# Patient Record
Sex: Female | Born: 1945
Health system: Southern US, Community
[De-identification: ages and names within clinical notes are randomized; demographics above are authoritative.]

## PROBLEM LIST (undated history)

## (undated) DIAGNOSIS — Z800 Family history of malignant neoplasm of digestive organs: Secondary | ICD-10-CM

## (undated) DIAGNOSIS — M1990 Unspecified osteoarthritis, unspecified site: Secondary | ICD-10-CM

## (undated) DIAGNOSIS — E669 Obesity, unspecified: Secondary | ICD-10-CM

## (undated) DIAGNOSIS — Z803 Family history of malignant neoplasm of breast: Secondary | ICD-10-CM

## (undated) DIAGNOSIS — M8580 Other specified disorders of bone density and structure, unspecified site: Secondary | ICD-10-CM

## (undated) DIAGNOSIS — H8109 Meniere's disease, unspecified ear: Secondary | ICD-10-CM

## (undated) DIAGNOSIS — Z8489 Family history of other specified conditions: Secondary | ICD-10-CM

## (undated) DIAGNOSIS — I2510 Atherosclerotic heart disease of native coronary artery without angina pectoris: Secondary | ICD-10-CM

## (undated) DIAGNOSIS — E039 Hypothyroidism, unspecified: Secondary | ICD-10-CM

## (undated) DIAGNOSIS — I10 Essential (primary) hypertension: Secondary | ICD-10-CM

## (undated) DIAGNOSIS — I219 Acute myocardial infarction, unspecified: Secondary | ICD-10-CM

## (undated) HISTORY — DX: Meniere's disease, unspecified ear: H81.09

## (undated) HISTORY — DX: Hypothyroidism, unspecified: E03.9

## (undated) HISTORY — PX: TYMPANOPLASTY: SHX33

## (undated) HISTORY — DX: Unspecified osteoarthritis, unspecified site: M19.90

## (undated) HISTORY — DX: Family history of malignant neoplasm of breast: Z80.3

## (undated) HISTORY — PX: TUBAL LIGATION: SHX77

## (undated) HISTORY — PX: BREAST SURGERY: SHX581

## (undated) HISTORY — DX: Other specified disorders of bone density and structure, unspecified site: M85.80

## (undated) HISTORY — DX: Family history of malignant neoplasm of digestive organs: Z80.0

---

## 1998-11-25 ENCOUNTER — Other Ambulatory Visit: Admission: RE | Admit: 1998-11-25 | Discharge: 1998-11-25 | Payer: Self-pay | Admitting: Obstetrics and Gynecology

## 2001-01-20 ENCOUNTER — Encounter: Payer: Self-pay | Admitting: Internal Medicine

## 2001-01-20 ENCOUNTER — Ambulatory Visit (HOSPITAL_COMMUNITY): Admission: RE | Admit: 2001-01-20 | Discharge: 2001-01-20 | Payer: Self-pay | Admitting: Internal Medicine

## 2001-05-04 ENCOUNTER — Other Ambulatory Visit: Admission: RE | Admit: 2001-05-04 | Discharge: 2001-05-04 | Payer: Self-pay | Admitting: Gynecology

## 2002-03-05 ENCOUNTER — Encounter: Payer: Self-pay | Admitting: Internal Medicine

## 2002-03-05 ENCOUNTER — Ambulatory Visit (HOSPITAL_COMMUNITY): Admission: RE | Admit: 2002-03-05 | Discharge: 2002-03-05 | Payer: Self-pay | Admitting: Internal Medicine

## 2003-04-15 ENCOUNTER — Ambulatory Visit (HOSPITAL_COMMUNITY): Admission: RE | Admit: 2003-04-15 | Discharge: 2003-04-15 | Payer: Self-pay | Admitting: Internal Medicine

## 2003-04-15 ENCOUNTER — Encounter: Payer: Self-pay | Admitting: Internal Medicine

## 2003-05-02 ENCOUNTER — Other Ambulatory Visit: Admission: RE | Admit: 2003-05-02 | Discharge: 2003-05-02 | Payer: Self-pay | Admitting: Gynecology

## 2003-06-07 ENCOUNTER — Ambulatory Visit (HOSPITAL_COMMUNITY): Admission: RE | Admit: 2003-06-07 | Discharge: 2003-06-07 | Payer: Self-pay | Admitting: General Surgery

## 2004-05-07 ENCOUNTER — Other Ambulatory Visit: Admission: RE | Admit: 2004-05-07 | Discharge: 2004-05-07 | Payer: Self-pay | Admitting: Gynecology

## 2004-05-07 ENCOUNTER — Ambulatory Visit (HOSPITAL_COMMUNITY): Admission: RE | Admit: 2004-05-07 | Discharge: 2004-05-07 | Payer: Self-pay | Admitting: Internal Medicine

## 2004-11-03 ENCOUNTER — Other Ambulatory Visit: Admission: RE | Admit: 2004-11-03 | Discharge: 2004-11-03 | Payer: Self-pay | Admitting: Internal Medicine

## 2005-05-12 ENCOUNTER — Ambulatory Visit (HOSPITAL_COMMUNITY): Admission: RE | Admit: 2005-05-12 | Discharge: 2005-05-12 | Payer: Self-pay | Admitting: Gynecology

## 2005-05-17 ENCOUNTER — Other Ambulatory Visit: Admission: RE | Admit: 2005-05-17 | Discharge: 2005-05-17 | Payer: Self-pay | Admitting: Gynecology

## 2006-05-17 ENCOUNTER — Ambulatory Visit (HOSPITAL_COMMUNITY): Admission: RE | Admit: 2006-05-17 | Discharge: 2006-05-17 | Payer: Self-pay | Admitting: Gynecology

## 2006-05-20 ENCOUNTER — Other Ambulatory Visit: Admission: RE | Admit: 2006-05-20 | Discharge: 2006-05-20 | Payer: Self-pay | Admitting: Gynecology

## 2007-05-19 ENCOUNTER — Ambulatory Visit (HOSPITAL_COMMUNITY): Admission: RE | Admit: 2007-05-19 | Discharge: 2007-05-19 | Payer: Self-pay | Admitting: Obstetrics and Gynecology

## 2007-06-08 ENCOUNTER — Other Ambulatory Visit: Admission: RE | Admit: 2007-06-08 | Discharge: 2007-06-08 | Payer: Self-pay | Admitting: Gynecology

## 2008-05-22 ENCOUNTER — Ambulatory Visit (HOSPITAL_COMMUNITY): Admission: RE | Admit: 2008-05-22 | Discharge: 2008-05-22 | Payer: Self-pay | Admitting: Gynecology

## 2008-06-19 ENCOUNTER — Encounter: Payer: Self-pay | Admitting: Women's Health

## 2008-06-19 ENCOUNTER — Other Ambulatory Visit: Admission: RE | Admit: 2008-06-19 | Discharge: 2008-06-19 | Payer: Self-pay | Admitting: Gynecology

## 2008-06-19 ENCOUNTER — Ambulatory Visit: Payer: Self-pay | Admitting: Women's Health

## 2009-04-11 ENCOUNTER — Emergency Department (HOSPITAL_COMMUNITY): Admission: EM | Admit: 2009-04-11 | Discharge: 2009-04-11 | Payer: Self-pay | Admitting: Emergency Medicine

## 2009-05-23 ENCOUNTER — Ambulatory Visit (HOSPITAL_COMMUNITY): Admission: RE | Admit: 2009-05-23 | Discharge: 2009-05-23 | Payer: Self-pay | Admitting: Internal Medicine

## 2009-05-30 ENCOUNTER — Encounter: Admission: RE | Admit: 2009-05-30 | Discharge: 2009-05-30 | Payer: Self-pay | Admitting: Ophthalmology

## 2010-05-25 ENCOUNTER — Ambulatory Visit (HOSPITAL_COMMUNITY)
Admission: RE | Admit: 2010-05-25 | Discharge: 2010-05-25 | Payer: Self-pay | Source: Home / Self Care | Admitting: Internal Medicine

## 2010-11-06 NOTE — H&P (Signed)
NAME:  Gloria Mccoy, Gloria Mccoy                        ACCOUNT NO.:  [id number]   MEDICAL RECORD NO.:  [id number]                  PATIENT TYPE:   LOCATION:                                       FACILITY:  [facility]   PHYSICIAN:  Dalia Heading, M.D.               DATE OF BIRTH:  [birth year]   DATE OF ADMISSION:  06/07/2003  DATE OF DISCHARGE:                                HISTORY & PHYSICAL   CHIEF COMPLAINT:  Need for screening colonoscopy.   HISTORY OF PRESENT ILLNESS:  The patient is a 65 year old black female who  is referred for a screening colonoscopy.  She has never had a colonoscopy.  She denies any abdominal pain complaints.  She has no history of melena or  hematochezia.  There is no family history of colon carcinoma.   PAST MEDICAL HISTORY:  Includes arthritis.   PAST SURGICAL HISTORY:  Tubal ligation, ear surgery.   CURRENT MEDICATIONS:  None.   ALLERGIES:  No known drug allergies.   REVIEW OF SYMPTOMS:  Unremarkable.   PHYSICAL EXAMINATION:  GENERAL:  The patient is a well-developed, well-  nourished black female in no acute distress.  VITAL SIGNS:  She is afebrile and vital signs are stable.  LUNGS:  Clear to auscultation with equal breath sounds bilaterally.  HEART:  Reveals a regular rate and rhythm without S3, S4, or murmurs.  ABDOMEN:  Soft, nontender, nondistended.  No hepatosplenomegaly, masses, or  hernias are identified.  RECTAL:  Deferred to the procedure.   IMPRESSION:  Need for screening colonoscopy.   PLAN:  The patient is scheduled for a colonoscopy on June 07, 2003.  The  risks and benefits of the procedure including bleeding and perforation were  fully explained to the patient, who gave informed consent.     ___________________________________________                                         Dalia Heading, M.D.   MAJ/MEDQ  D:  05/30/2003  T:  05/30/2003  Job:  [id number]   cc:   Madelin Rear. Sherwood Gambler, M.D.  [address]  [place]  [place] [address]  Fax:  [phone]

## 2011-05-06 ENCOUNTER — Other Ambulatory Visit (HOSPITAL_COMMUNITY): Payer: Self-pay | Admitting: Internal Medicine

## 2011-05-06 DIAGNOSIS — Z139 Encounter for screening, unspecified: Secondary | ICD-10-CM

## 2011-05-31 ENCOUNTER — Ambulatory Visit (HOSPITAL_COMMUNITY)
Admission: RE | Admit: 2011-05-31 | Discharge: 2011-05-31 | Disposition: A | Discharge status: Home / Self Care | Payer: Medicare HMO | Source: Ambulatory Visit | Attending: Internal Medicine | Admitting: Internal Medicine

## 2011-05-31 DIAGNOSIS — Z139 Encounter for screening, unspecified: Secondary | ICD-10-CM

## 2011-05-31 DIAGNOSIS — Z1231 Encounter for screening mammogram for malignant neoplasm of breast: Secondary | ICD-10-CM | POA: Insufficient documentation

## 2011-12-31 ENCOUNTER — Encounter: Payer: Self-pay | Admitting: Gynecology

## 2011-12-31 DIAGNOSIS — M1990 Unspecified osteoarthritis, unspecified site: Secondary | ICD-10-CM | POA: Insufficient documentation

## 2011-12-31 DIAGNOSIS — M8580 Other specified disorders of bone density and structure, unspecified site: Secondary | ICD-10-CM | POA: Insufficient documentation

## 2011-12-31 DIAGNOSIS — H8109 Meniere's disease, unspecified ear: Secondary | ICD-10-CM | POA: Insufficient documentation

## 2012-01-06 ENCOUNTER — Encounter: Payer: Self-pay | Admitting: Gynecology

## 2012-01-06 ENCOUNTER — Other Ambulatory Visit (HOSPITAL_COMMUNITY)
Admission: RE | Admit: 2012-01-06 | Discharge: 2012-01-06 | Disposition: A | Discharge status: Home / Self Care | Payer: Medicare HMO | Source: Ambulatory Visit | Attending: Gynecology | Admitting: Gynecology

## 2012-01-06 ENCOUNTER — Ambulatory Visit (INDEPENDENT_AMBULATORY_CARE_PROVIDER_SITE_OTHER): Payer: Medicare HMO | Admitting: Gynecology

## 2012-01-06 VITALS — BP 114/66 | Ht 63.0 in | Wt 188.0 lb

## 2012-01-06 DIAGNOSIS — M949 Disorder of cartilage, unspecified: Secondary | ICD-10-CM

## 2012-01-06 DIAGNOSIS — M8580 Other specified disorders of bone density and structure, unspecified site: Secondary | ICD-10-CM

## 2012-01-06 DIAGNOSIS — R3915 Urgency of urination: Secondary | ICD-10-CM

## 2012-01-06 DIAGNOSIS — N952 Postmenopausal atrophic vaginitis: Secondary | ICD-10-CM

## 2012-01-06 DIAGNOSIS — N8111 Cystocele, midline: Secondary | ICD-10-CM

## 2012-01-06 DIAGNOSIS — Z124 Encounter for screening for malignant neoplasm of cervix: Secondary | ICD-10-CM

## 2012-01-06 DIAGNOSIS — E039 Hypothyroidism, unspecified: Secondary | ICD-10-CM | POA: Insufficient documentation

## 2012-01-06 DIAGNOSIS — Z1151 Encounter for screening for human papillomavirus (HPV): Secondary | ICD-10-CM | POA: Insufficient documentation

## 2012-01-06 NOTE — Progress Notes (Signed)
Gloria Mccoy [birth year] [id number]        66 y.o.  [id number] for follow up exam.  Has not been here in over 3 years. Several issues noted below  Past medical history,surgical history, medications, allergies, family history and social history were all reviewed and documented in the EPIC chart. ROS:  Was performed and pertinent positives and negatives are included in the history.  Exam: Kim assistant Filed Vitals:   01/06/12 1054  BP: 114/66  Height: 5\' 3"  (1.6 m)  Weight: 188 lb (85.276 kg)   General appearance  Normal Skin grossly normal Head/Neck normal with no cervical or supraclavicular adenopathy thyroid normal Lungs  clear Cardiac RR, without RMG Abdominal  soft, nontender, without masses, organomegaly or hernia Breasts  examined lying and sitting without masses, retractions, discharge or axillary adenopathy. Pelvic  Ext/BUS/vagina  Atrophic changes with mild cystocele right labia minora larger than left but normal in appearance  Cervix  normal atrophic Pap/HPV  Uterus  axial, normal size, shape and contour, midline and mobile nontender   Adnexa  Without masses or tenderness    Anus and perineum  normal   Rectovaginal  normal sphincter tone without palpated masses or tenderness.    Assessment/Plan:  67 y.o. [id number] female for follow up exam.   1. Osteopenia.  DEXA 06/2007 T score -1.5 FRAX 11%/0.9%. Repeat DEXA ordered today. 2. Vitamin D deficiency. History of low vitamin D before of 11,008. On follow up supplementation 2009 was 35. Recheck vitamin D level today. Increase calcium vitamin D discussed. 3. Mild cystocele/vaginal atrophy. Patient asymptomatic other than mild urgency symptoms. Behavior modification issues reviewed.  Check baseline urinalysis. 4. Pap smear. Last Pap smear 2009. Pap/HPV today. Discussed current screening guidelines. She has no history of abnormal Paps before. If this Paps normal them plan no further screening. 5. Mammography. Patient due November  2013. I reminded her to schedule this. SBE monthly reviewed. 6. Colonoscopy. Patient with and her 10 year interval. Reports having stool guaiacs done through her primary physician's office. 7. Health maintenance. Patient will continue to see her primary for routine health care and follow up of her hypothyroidism. Patient will see me in follow up for her DEXA then otherwise in one year.    Dara Lords MD, 11:17 AM 01/06/2012

## 2012-01-06 NOTE — Patient Instructions (Signed)
Follow up for bone density study as scheduled Follow up in one year for annual exam

## 2012-01-07 ENCOUNTER — Telehealth: Payer: Self-pay | Admitting: Gynecology

## 2012-01-07 LAB — VITAMIN D 25 HYDROXY (VIT D DEFICIENCY, FRACTURES): Vit D, 25-Hydroxy: 29 ng/mL — ABNORMAL LOW (ref 30–89)

## 2012-01-07 NOTE — Telephone Encounter (Signed)
Patient returned my call regarding her lab results. I had in error closed the result note so am documenting here.  Patient was informed that her Vitamin D level was marginally low and that Dr. Velvet Bathe recommended she take 2000 units Vitamin D daily. She was told she can get that OTC.

## 2012-01-08 LAB — URINALYSIS W MICROSCOPIC + REFLEX CULTURE
Bacteria, UA: NONE SEEN
Casts: NONE SEEN
Glucose, UA: NEGATIVE mg/dL
Protein, ur: NEGATIVE mg/dL
Specific Gravity, Urine: 1.018 (ref 1.005–1.030)
Squamous Epithelial / LPF: NONE SEEN
pH: 5.5 (ref 5.0–8.0)

## 2012-01-20 DIAGNOSIS — M8580 Other specified disorders of bone density and structure, unspecified site: Secondary | ICD-10-CM

## 2012-01-20 HISTORY — DX: Other specified disorders of bone density and structure, unspecified site: M85.80

## 2012-02-01 ENCOUNTER — Ambulatory Visit (INDEPENDENT_AMBULATORY_CARE_PROVIDER_SITE_OTHER): Payer: Medicare HMO

## 2012-02-01 DIAGNOSIS — M899 Disorder of bone, unspecified: Secondary | ICD-10-CM

## 2012-02-01 DIAGNOSIS — M8580 Other specified disorders of bone density and structure, unspecified site: Secondary | ICD-10-CM

## 2012-02-03 ENCOUNTER — Encounter: Payer: Self-pay | Admitting: Gynecology

## 2012-04-13 ENCOUNTER — Other Ambulatory Visit (HOSPITAL_COMMUNITY): Payer: Self-pay | Admitting: Internal Medicine

## 2012-04-13 DIAGNOSIS — Z139 Encounter for screening, unspecified: Secondary | ICD-10-CM

## 2012-06-01 ENCOUNTER — Ambulatory Visit (HOSPITAL_COMMUNITY): Payer: Medicare HMO

## 2012-06-08 ENCOUNTER — Ambulatory Visit (HOSPITAL_COMMUNITY): Payer: Medicare HMO

## 2012-06-12 ENCOUNTER — Ambulatory Visit (HOSPITAL_COMMUNITY)
Admission: RE | Admit: 2012-06-12 | Discharge: 2012-06-12 | Disposition: A | Discharge status: Home / Self Care | Payer: Medicare HMO | Source: Ambulatory Visit | Attending: Internal Medicine | Admitting: Internal Medicine

## 2012-06-12 DIAGNOSIS — Z139 Encounter for screening, unspecified: Secondary | ICD-10-CM

## 2012-06-12 DIAGNOSIS — Z1231 Encounter for screening mammogram for malignant neoplasm of breast: Secondary | ICD-10-CM | POA: Insufficient documentation

## 2012-09-21 NOTE — H&P (Signed)
  NTS SOAP Note  Vital Signs:  Vitals as of: 09/21/2012: Systolic 157: Diastolic 79: Heart Rate 70: Temp 53F: Height 1ft 2in: Weight 191Lbs 0 Ounces: BMI 34.93  BMI : 34.93 kg/m2  Subjective: This 67 Years 98 Months old Female presents for screening TCS.  Denies any gi complaints.  Last had a TCS 10 years ago.  No family h/o colon cancer.  Review of Symptoms:  Constitutional:unremarkable   Head:unremarkable    Eyes:unremarkable   Nose/Mouth/Throat:unremarkable Cardiovascular:  unremarkable   Respiratory:unremarkable   Gastrointestinal:  unremarkable   Genitourinary:unremarkable     Musculoskeletal:unremarkable   Skin:unremarkable Hematolgic/Lymphatic:unremarkable     Allergic/Immunologic:unremarkable     Past Medical History:    Reviewed   Past Medical History  Surgical History: BTL Medical Problems:  Hypothyroidism Allergies: sulfa Medications: thyroid med   Social History:Reviewed  Social History  Preferred Language: English Race:  Black or African American Ethnicity: Not Hispanic / Latino Age: 67 Years 7 Months Marital Status:  M Alcohol: 1 glass daily Recreational drug(s):  No   Smoking Status: Never smoker reviewed on 09/21/2012 Functional Status reviewed on mm/dd/yyyy ------------------------------------------------ Bathing: Normal Cooking: Normal Dressing: Normal Driving: Normal Eating: Normal Managing Meds: Normal Oral Care: Normal Shopping: Normal Toileting: Normal Transferring: Normal Walking: Normal Cognitive Status reviewed on mm/dd/yyyy ------------------------------------------------ Attention: Normal Decision Making: Normal Language: Normal Memory: Normal Motor: Normal Perception: Normal Problem Solving: Normal Visual and Spatial: Normal   Family History:  Reviewed   Family History  Is there a family history of:CAD, DM    Objective Information: General:  Well appearing, well  nourished in no distress. Neck:  Supple without lymphadenopathy.  Heart:  RRR, no murmur Lungs:    CTA bilaterally, no wheezes, rhonchi, rales.  Breathing unlabored. Abdomen:Soft, NT/ND, no HSM, no masses.   deferred to procedure  Assessment:Need for Screening TCS  Diagnosis &amp; Procedure Smart Code   Plan:Scheduled for screening TCS on 09/26/12.   Patient Education:Alternative treatments to surgery were discussed with patient (and family).  Risks and benefits  of procedure were fully explained to the patient (and family) who gave informed consent. Patient/family questions were addressed.  Follow-up:Pending Surgery

## 2012-09-25 ENCOUNTER — Encounter (HOSPITAL_COMMUNITY): Payer: Self-pay | Admitting: Pharmacy Technician

## 2012-09-26 ENCOUNTER — Encounter (HOSPITAL_COMMUNITY): Payer: Self-pay | Admitting: *Deleted

## 2012-09-26 ENCOUNTER — Ambulatory Visit (HOSPITAL_COMMUNITY)
Admission: RE | Admit: 2012-09-26 | Discharge: 2012-09-26 | Disposition: A | Discharge status: Home / Self Care | Payer: Medicare HMO | Source: Ambulatory Visit | Attending: General Surgery | Admitting: General Surgery

## 2012-09-26 ENCOUNTER — Encounter (HOSPITAL_COMMUNITY)
Admission: RE | Disposition: A | Discharge status: Home / Self Care | Payer: Self-pay | Source: Ambulatory Visit | Attending: General Surgery

## 2012-09-26 DIAGNOSIS — Z1211 Encounter for screening for malignant neoplasm of colon: Secondary | ICD-10-CM | POA: Insufficient documentation

## 2012-09-26 HISTORY — PX: COLONOSCOPY: SHX5424

## 2012-09-26 SURGERY — COLONOSCOPY
Anesthesia: Moderate Sedation

## 2012-09-26 MED ORDER — MEPERIDINE HCL 50 MG/ML IJ SOLN
INTRAMUSCULAR | Status: AC
  Filled 2012-09-26: qty 1

## 2012-09-26 MED ORDER — SODIUM CHLORIDE 0.9 % IV SOLN
INTRAVENOUS | Status: DC
  Administered 2012-09-26: 10:00:00 via INTRAVENOUS

## 2012-09-26 MED ORDER — MEPERIDINE HCL 25 MG/ML IJ SOLN
INTRAMUSCULAR | Status: DC | PRN
  Administered 2012-09-26: 50 mg via INTRAVENOUS

## 2012-09-26 MED ORDER — STERILE WATER FOR IRRIGATION IR SOLN
Status: DC | PRN
  Administered 2012-09-26: 11:00:00

## 2012-09-26 MED ORDER — MIDAZOLAM HCL 5 MG/5ML IJ SOLN
INTRAMUSCULAR | Status: DC | PRN
  Administered 2012-09-26: 3 mg via INTRAVENOUS

## 2012-09-26 MED ORDER — MIDAZOLAM HCL 5 MG/5ML IJ SOLN
INTRAMUSCULAR | Status: AC
  Filled 2012-09-26: qty 5

## 2012-09-26 NOTE — Interval H&P Note (Signed)
History and Physical Interval Note:  09/26/2012 10:56 AM  Gloria Mccoy  has presented today for surgery, with the diagnosis of screening  The various methods of treatment have been discussed with the patient and family. After consideration of risks, benefits and other options for treatment, the patient has consented to  Procedure(s): COLONOSCOPY (N/A) as a surgical intervention .  The patient's history has been reviewed, patient examined, no change in status, stable for surgery.  I have reviewed the patient's chart and labs.  Questions were answered to the patient's satisfaction.     Franky Macho A

## 2012-09-26 NOTE — Op Note (Signed)
[facility] [address] [place], [address]   COLONOSCOPY PROCEDURE REPORT  PATIENT: Gloria Mccoy, Gloria Mccoy  MR#: [id number] BIRTHDATE: 07/24/1945 , 66  yrs. old GENDER: Female ENDOSCOPIST: Franky Macho, MD REFERRED XB:JYNWG, Peyton Najjar PROCEDURE DATE:  09/26/2012 PROCEDURE:   Colonoscopy, screening ASA CLASS:   Class II INDICATIONS:Average risk patient for colon cancer. MEDICATIONS: Versed 3 mg IV and Demerol 50 mg IV  DESCRIPTION OF PROCEDURE:   After the risks benefits and alternatives of the procedure were thoroughly explained, informed consent was obtained.  A digital rectal exam revealed no abnormalities of the rectum.   The [id number] ([id number])  endoscope was introduced through the anus and advanced to the cecum, which was identified by both the appendix and ileocecal valve. No adverse events experienced.   The quality of the prep was adequate, using MoviPrep  The instrument was then slowly withdrawn as the colon was fully examined.      COLON FINDINGS: A normal appearing cecum, ileocecal valve, and appendiceal orifice were identified.  The ascending, hepatic flexure, transverse, splenic flexure, descending, sigmoid colon and rectum appeared unremarkable.  No polyps or cancers were seen. Retroflexed views revealed no abnormalities. The time to cecum=4 minutes 0 seconds.  Withdrawal time=4 minutes 0 seconds.  The scope was withdrawn and the procedure completed. COMPLICATIONS: There were no complications.  ENDOSCOPIC IMPRESSION: Normal colon  RECOMMENDATIONS: Repeat Colonscopy in 10 years.   eSigned:  Franky Macho, MD 09/26/2012 11:12 AM   cc:

## 2012-09-28 ENCOUNTER — Encounter (HOSPITAL_COMMUNITY): Payer: Self-pay | Admitting: General Surgery

## 2013-05-11 ENCOUNTER — Other Ambulatory Visit: Payer: Self-pay | Admitting: Women's Health

## 2013-05-11 DIAGNOSIS — Z139 Encounter for screening, unspecified: Secondary | ICD-10-CM

## 2013-06-13 ENCOUNTER — Ambulatory Visit (INDEPENDENT_AMBULATORY_CARE_PROVIDER_SITE_OTHER): Payer: Medicare HMO | Admitting: Women's Health

## 2013-06-13 ENCOUNTER — Encounter: Payer: Self-pay | Admitting: Women's Health

## 2013-06-13 VITALS — BP 110/68 | Ht 64.0 in | Wt 188.0 lb

## 2013-06-13 DIAGNOSIS — M899 Disorder of bone, unspecified: Secondary | ICD-10-CM

## 2013-06-13 DIAGNOSIS — M8580 Other specified disorders of bone density and structure, unspecified site: Secondary | ICD-10-CM

## 2013-06-13 NOTE — Patient Instructions (Signed)
Health Recommendations for Postmenopausal Women Respected and ongoing research has looked at the most common causes of death, disability, and poor quality of life in postmenopausal women. The causes include heart disease, diseases of blood vessels, diabetes, depression, cancer, and bone loss (osteoporosis). Many things can be done to help lower the chances of developing these and other common problems: CARDIOVASCULAR DISEASE Heart Disease: A heart attack is a medical emergency. Know the signs and symptoms of a heart attack. Below are things women can do to reduce their risk for heart disease.   Do not smoke. If you smoke, quit.  Aim for a healthy weight. Being overweight causes many preventable deaths. Eat a healthy and balanced diet and drink an adequate amount of liquids.  Get moving. Make a commitment to be more physically active. Aim for 30 minutes of activity on most, if not all days of the week.  Eat for heart health. Choose a diet that is low in saturated fat and cholesterol and eliminate trans fat. Include whole grains, vegetables, and fruits. Read and understand the labels on food containers before buying.  Know your numbers. Ask your caregiver to check your blood pressure, cholesterol (total, HDL, LDL, triglycerides) and blood glucose. Work with your caregiver on improving your entire clinical picture.  High blood pressure. Limit or stop your table salt intake (try salt substitute and food seasonings). Avoid salty foods and drinks. Read labels on food containers before buying. Eating well and exercising can help control high blood pressure. STROKE  Stroke is a medical emergency. Stroke may be the result of a blood clot in a blood vessel in the brain or by a brain hemorrhage (bleeding). Know the signs and symptoms of a stroke. To lower the risk of developing a stroke:  Avoid fatty foods.  Quit smoking.  Control your diabetes, blood pressure, and irregular heart rate. THROMBOPHLEBITIS  (BLOOD CLOT) OF THE LEG  Becoming overweight and leading a stationary lifestyle may also contribute to developing blood clots. Controlling your diet and exercising will help lower the risk of developing blood clots. CANCER SCREENING  Breast Cancer: Take steps to reduce your risk of breast cancer.  You should practice "breast self-awareness." This means understanding the normal appearance and feel of your breasts and should include breast self-examination. Any changes detected, no matter how small, should be reported to your caregiver.  After age 40, you should have a clinical breast exam (CBE) every year.  Starting at age 40, you should consider having a mammogram (breast X-ray) every year.  If you have a family history of breast cancer, talk to your caregiver about genetic screening.  If you are at high risk for breast cancer, talk to your caregiver about having an MRI and a mammogram every year.  Intestinal or Stomach Cancer: Tests to consider are a rectal exam, fecal occult blood, sigmoidoscopy, and colonoscopy. Women who are high risk may need to be screened at an earlier age and more often.  Cervical Cancer:  Beginning at age 30, you should have a Pap test every 3 years as long as the past 3 Pap tests have been normal.  If you have had past treatment for cervical cancer or a condition that could lead to cancer, you need Pap tests and screening for cancer for at least 20 years after your treatment.  If you had a hysterectomy for a problem that was not cancer or a condition that could lead to cancer, then you no longer need Pap tests.    If you are between ages 65 and 70, and you have had normal Pap tests going back 10 years, you no longer need Pap tests.  If Pap tests have been discontinued, risk factors (such as a new sexual partner) need to be reassessed to determine if screening should be resumed.  Some medical problems can increase the chance of getting cervical cancer. In these  cases, your caregiver may recommend more frequent screening and Pap tests.  Uterine Cancer: If you have vaginal bleeding after reaching menopause, you should notify your caregiver.  Ovarian cancer: Other than yearly pelvic exams, there are no reliable tests available to screen for ovarian cancer at this time except for yearly pelvic exams.  Lung Cancer: Yearly chest X-rays can detect lung cancer and should be done on high risk women, such as cigarette smokers and women with chronic lung disease (emphysema).  Skin Cancer: A complete body skin exam should be done at your yearly examination. Avoid overexposure to the sun and ultraviolet light lamps. Use a strong sun block cream when in the sun. All of these things are important in lowering the risk of skin cancer. MENOPAUSE Menopause Symptoms: Hormone therapy products are effective for treating symptoms associated with menopause:  Moderate to severe hot flashes.  Night sweats.  Mood swings.  Headaches.  Tiredness.  Loss of sex drive.  Insomnia.  Other symptoms. Hormone replacement carries certain risks, especially in older women. Women who use or are thinking about using estrogen or estrogen with progestin treatments should discuss that with their caregiver. Your caregiver will help you understand the benefits and risks. The ideal dose of hormone replacement therapy is not known. The Food and Drug Administration (FDA) has concluded that hormone therapy should be used only at the lowest doses and for the shortest amount of time to reach treatment goals.  OSTEOPOROSIS Protecting Against Bone Loss and Preventing Fracture: If you use hormone therapy for prevention of bone loss (osteoporosis), the risks for bone loss must outweigh the risk of the therapy. Ask your caregiver about other medications known to be safe and effective for preventing bone loss and fractures. To guard against bone loss or fractures, the following is recommended:  If  you are less than age 50, take 1000 mg of calcium and at least 600 mg of Vitamin D per day.  If you are greater than age 50 but less than age 70, take 1200 mg of calcium and at least 600 mg of Vitamin D per day.  If you are greater than age 70, take 1200 mg of calcium and at least 800 mg of Vitamin D per day. Smoking and excessive alcohol intake increases the risk of osteoporosis. Eat foods rich in calcium and vitamin D and do weight bearing exercises several times a week as your caregiver suggests. DIABETES Diabetes Melitus: If you have Type I or Type 2 diabetes, you should keep your blood sugar under control with diet, exercise and recommended medication. Avoid too many sweets, starchy and fatty foods. Being overweight can make control more difficult. COGNITION AND MEMORY Cognition and Memory: Menopausal hormone therapy is not recommended for the prevention of cognitive disorders such as Alzheimer's disease or memory loss.  DEPRESSION  Depression may occur at any age, but is common in elderly women. The reasons may be because of physical, medical, social (loneliness), or financial problems and needs. If you are experiencing depression because of medical problems and control of symptoms, talk to your caregiver about this. Physical activity and   exercise may help with mood and sleep. Community and volunteer involvement may help your sense of value and worth. If you have depression and you feel that the problem is getting worse or becoming severe, talk to your caregiver about treatment options that are best for you. ACCIDENTS  Accidents are common and can be serious in the elderly woman. Prepare your house to prevent accidents. Eliminate throw rugs, place hand bars in the bath, shower and toilet areas. Avoid wearing high heeled shoes or walking on wet, snowy, and icy areas. Limit or stop driving if you have vision or hearing problems, or you feel you are unsteady with you movements and  reflexes. HEPATITIS C Hepatitis C is a type of viral infection affecting the liver. It is spread mainly through contact with blood from an infected person. It can be treated, but if left untreated, it can lead to severe liver damage over years. Many people who are infected do not know that the virus is in their blood. If you are a "baby-boomer", it is recommended that you have one screening test for Hepatitis C. IMMUNIZATIONS  Several immunizations are important to consider having during your senior years, including:   Tetanus, diptheria, and pertussis booster shot.  Influenza every year before the flu season begins.  Pneumonia vaccine.  Shingles vaccine.  Others as indicated based on your specific needs. Talk to your caregiver about these. Document Released: 07/30/2005 Document Revised: 05/24/2012 Document Reviewed: 03/25/2008 [employer] Patient Information 2014 [employer].  

## 2013-06-13 NOTE — Progress Notes (Signed)
Gloria MCEUEN 10-22-1945 [id number]    History:    The patient presents for breast and pelvic exam. History of normal Paps and mammograms. 01/2012 DEXA T score -1.3 of left hip, FRAX 7.9%/0.6%. Hypothyroid primary care manages. Normal colonoscopy 09/2012. Had zostavac, unsure of  Pneumovax.   Past medical history, past surgical history, family history and social history were all reviewed and documented in the EPIC chart. Menieres and arthritis. Numerous family members with type 2 diabetes and hypertension care.  Ceasar Mons Vitals:   06/13/13 1050  BP: 110/68    General appearance:  Normal Head/Neck:  Normal, without cervical or supraclavicular adenopathy. Thyroid:  Symmetrical, normal in size, without palpable masses or nodularity. Respiratory  Effort:  Normal  Auscultation:  Clear without wheezing or rhonchi Cardiovascular  Auscultation:  Regular rate, without rubs, murmurs or gallops  Edema/varicosities:  Not grossly evident Abdominal  Soft,nontender, without masses, guarding or rebound.  Liver/spleen:  No organomegaly noted  Hernia:  None appreciated  Skin  Inspection:  Grossly normal  Palpation:  Grossly normal Neurologic/psychiatric  Orientation:  Normal with appropriate conversation.  Mood/affect:  Normal  Genitourinary    Breasts: Examined lying and sitting.     Right: Without masses, retractions, discharge or axillary adenopathy.     Left: Without masses, retractions, discharge or axillary adenopathy.   Inguinal/mons:  Normal without inguinal adenopathy  External genitalia:  Normal  BUS/Urethra/Skene's glands:  Normal  Bladder:  Normal  Vagina:  Normal  Cervix:  Normal  Uterus:   normal in size, shape and contour.  Midline and mobile  Adnexa/parametria:     Rt: Without masses or tenderness.   Lt: Without masses or tenderness.  Anus and perineum: Normal  Digital rectal exam: Normal sphincter tone without palpated masses or tenderness  Assessment/Plan:  67 y.o.  MBF G2P2  for breast and pelvic exam with no complaints.  Osteopenia Hypothyroid-primary care manages labs and meds  Plan: SBE's, continue annual mammogram, calcium rich diet, vitamin D 2000 daily encouraged. Instructed to get  Pneumovax at primary care. Continue annual flu vaccine. Home safety and fall prevention discussed. Reviewed importance of increasing and maintaining regular exercise. Pap normal 2013, new screening guidelines reviewed.     Harrington Challenger WHNP, 11:14 AM 06/13/2013

## 2013-06-18 ENCOUNTER — Ambulatory Visit (HOSPITAL_COMMUNITY)
Admission: RE | Admit: 2013-06-18 | Discharge: 2013-06-18 | Disposition: A | Discharge status: Home / Self Care | Payer: Medicare HMO | Source: Ambulatory Visit | Attending: Women's Health | Admitting: Women's Health

## 2013-06-18 DIAGNOSIS — Z139 Encounter for screening, unspecified: Secondary | ICD-10-CM

## 2013-06-18 DIAGNOSIS — Z1231 Encounter for screening mammogram for malignant neoplasm of breast: Secondary | ICD-10-CM | POA: Insufficient documentation

## 2013-06-25 ENCOUNTER — Encounter: Payer: Self-pay | Admitting: Women's Health

## 2014-04-03 ENCOUNTER — Other Ambulatory Visit (HOSPITAL_COMMUNITY): Payer: Self-pay | Admitting: Internal Medicine

## 2014-04-03 DIAGNOSIS — Z1231 Encounter for screening mammogram for malignant neoplasm of breast: Secondary | ICD-10-CM

## 2014-04-22 ENCOUNTER — Encounter: Payer: Self-pay | Admitting: Women's Health

## 2014-06-10 ENCOUNTER — Ambulatory Visit (HOSPITAL_COMMUNITY): Payer: Medicare HMO

## 2014-06-19 ENCOUNTER — Other Ambulatory Visit (HOSPITAL_COMMUNITY): Payer: Self-pay | Admitting: Internal Medicine

## 2014-06-19 ENCOUNTER — Ambulatory Visit (HOSPITAL_COMMUNITY)
Admission: RE | Admit: 2014-06-19 | Discharge: 2014-06-19 | Disposition: A | Discharge status: Home / Self Care | Payer: Medicare HMO | Source: Ambulatory Visit | Attending: Internal Medicine | Admitting: Internal Medicine

## 2014-06-19 ENCOUNTER — Encounter: Payer: Medicare HMO | Admitting: Women's Health

## 2014-06-19 DIAGNOSIS — Z1231 Encounter for screening mammogram for malignant neoplasm of breast: Secondary | ICD-10-CM | POA: Diagnosis not present

## 2014-06-19 DIAGNOSIS — Z6833 Body mass index (BMI) 33.0-33.9, adult: Secondary | ICD-10-CM

## 2014-06-19 DIAGNOSIS — Z0000 Encounter for general adult medical examination without abnormal findings: Secondary | ICD-10-CM

## 2014-06-24 ENCOUNTER — Ambulatory Visit (HOSPITAL_COMMUNITY): Payer: Medicare HMO

## 2014-06-25 ENCOUNTER — Ambulatory Visit (HOSPITAL_COMMUNITY)
Admission: RE | Admit: 2014-06-25 | Discharge: 2014-06-25 | Disposition: A | Discharge status: Home / Self Care | Payer: Commercial Managed Care - HMO | Source: Ambulatory Visit | Attending: Internal Medicine | Admitting: Internal Medicine

## 2014-06-25 DIAGNOSIS — Z780 Asymptomatic menopausal state: Secondary | ICD-10-CM | POA: Insufficient documentation

## 2014-06-25 DIAGNOSIS — Z13820 Encounter for screening for osteoporosis: Secondary | ICD-10-CM | POA: Diagnosis not present

## 2014-06-25 DIAGNOSIS — M8580 Other specified disorders of bone density and structure, unspecified site: Secondary | ICD-10-CM | POA: Insufficient documentation

## 2014-06-25 DIAGNOSIS — Z6833 Body mass index (BMI) 33.0-33.9, adult: Secondary | ICD-10-CM

## 2014-06-25 DIAGNOSIS — Z0000 Encounter for general adult medical examination without abnormal findings: Secondary | ICD-10-CM

## 2014-06-25 DIAGNOSIS — E559 Vitamin D deficiency, unspecified: Secondary | ICD-10-CM | POA: Insufficient documentation

## 2014-07-03 ENCOUNTER — Encounter: Payer: Self-pay | Admitting: Women's Health

## 2014-07-03 ENCOUNTER — Ambulatory Visit (INDEPENDENT_AMBULATORY_CARE_PROVIDER_SITE_OTHER): Payer: Commercial Managed Care - HMO | Admitting: Women's Health

## 2014-07-03 VITALS — BP 130/80 | Ht 64.0 in | Wt 194.0 lb

## 2014-07-03 DIAGNOSIS — Z01419 Encounter for gynecological examination (general) (routine) without abnormal findings: Secondary | ICD-10-CM

## 2014-07-03 DIAGNOSIS — M8580 Other specified disorders of bone density and structure, unspecified site: Secondary | ICD-10-CM

## 2014-07-03 NOTE — Patient Instructions (Signed)
Health Recommendations for Postmenopausal Women Respected and ongoing research has looked at the most common causes of death, disability, and poor quality of life in postmenopausal women. The causes include heart disease, diseases of blood vessels, diabetes, depression, cancer, and bone loss (osteoporosis). Many things can be done to help lower the chances of developing these and other common problems. CARDIOVASCULAR DISEASE Heart Disease: A heart attack is a medical emergency. Know the signs and symptoms of a heart attack. Below are things women can do to reduce their risk for heart disease.   Do not smoke. If you smoke, quit.  Aim for a healthy weight. Being overweight causes many preventable deaths. Eat a healthy and balanced diet and drink an adequate amount of liquids.  Get moving. Make a commitment to be more physically active. Aim for 30 minutes of activity on most, if not all days of the week.  Eat for heart health. Choose a diet that is low in saturated fat and cholesterol and eliminate trans fat. Include whole grains, vegetables, and fruits. Read and understand the labels on food containers before buying.  Know your numbers. Ask your caregiver to check your blood pressure, cholesterol (total, HDL, LDL, triglycerides) and blood glucose. Work with your caregiver on improving your entire clinical picture.  High blood pressure. Limit or stop your table salt intake (try salt substitute and food seasonings). Avoid salty foods and drinks. Read labels on food containers before buying. Eating well and exercising can help control high blood pressure. STROKE  Stroke is a medical emergency. Stroke may be the result of a blood clot in a blood vessel in the brain or by a brain hemorrhage (bleeding). Know the signs and symptoms of a stroke. To lower the risk of developing a stroke:  Avoid fatty foods.  Quit smoking.  Control your diabetes, blood pressure, and irregular heart rate. THROMBOPHLEBITIS  (BLOOD CLOT) OF THE LEG  Becoming overweight and leading a stationary lifestyle may also contribute to developing blood clots. Controlling your diet and exercising will help lower the risk of developing blood clots. CANCER SCREENING  Breast Cancer: Take steps to reduce your risk of breast cancer.  You should practice "breast self-awareness." This means understanding the normal appearance and feel of your breasts and should include breast self-examination. Any changes detected, no matter how small, should be reported to your caregiver.  After age 40, you should have a clinical breast exam (CBE) every year.  Starting at age 40, you should consider having a mammogram (breast X-ray) every year.  If you have a family history of breast cancer, talk to your caregiver about genetic screening.  If you are at high risk for breast cancer, talk to your caregiver about having an MRI and a mammogram every year.  Intestinal or Stomach Cancer: Tests to consider are a rectal exam, fecal occult blood, sigmoidoscopy, and colonoscopy. Women who are high risk may need to be screened at an earlier age and more often.  Cervical Cancer:  Beginning at age 30, you should have a Pap test every 3 years as long as the past 3 Pap tests have been normal.  If you have had past treatment for cervical cancer or a condition that could lead to cancer, you need Pap tests and screening for cancer for at least 20 years after your treatment.  If you had a hysterectomy for a problem that was not cancer or a condition that could lead to cancer, then you no longer need Pap tests.    If you are between ages 65 and 70, and you have had normal Pap tests going back 10 years, you no longer need Pap tests.  If Pap tests have been discontinued, risk factors (such as a new sexual partner) need to be reassessed to determine if screening should be resumed.  Some medical problems can increase the chance of getting cervical cancer. In these  cases, your caregiver may recommend more frequent screening and Pap tests.  Uterine Cancer: If you have vaginal bleeding after reaching menopause, you should notify your caregiver.  Ovarian Cancer: Other than yearly pelvic exams, there are no reliable tests available to screen for ovarian cancer at this time except for yearly pelvic exams.  Lung Cancer: Yearly chest X-rays can detect lung cancer and should be done on high risk women, such as cigarette smokers and women with chronic lung disease (emphysema).  Skin Cancer: A complete body skin exam should be done at your yearly examination. Avoid overexposure to the sun and ultraviolet light lamps. Use a strong sun block cream when in the sun. All of these things are important for lowering the risk of skin cancer. MENOPAUSE Menopause Symptoms: Hormone therapy products are effective for treating symptoms associated with menopause:  Moderate to severe hot flashes.  Night sweats.  Mood swings.  Headaches.  Tiredness.  Loss of sex drive.  Insomnia.  Other symptoms. Hormone replacement carries certain risks, especially in older women. Women who use or are thinking about using estrogen or estrogen with progestin treatments should discuss that with their caregiver. Your caregiver will help you understand the benefits and risks. The ideal dose of hormone replacement therapy is not known. The Food and Drug Administration (FDA) has concluded that hormone therapy should be used only at the lowest doses and for the shortest amount of time to reach treatment goals.  OSTEOPOROSIS Protecting Against Bone Loss and Preventing Fracture If you use hormone therapy for prevention of bone loss (osteoporosis), the risks for bone loss must outweigh the risk of the therapy. Ask your caregiver about other medications known to be safe and effective for preventing bone loss and fractures. To guard against bone loss or fractures, the following is recommended:  If  you are younger than age 50, take 1000 mg of calcium and at least 600 mg of Vitamin D per day.  If you are older than age 50 but younger than age 70, take 1200 mg of calcium and at least 600 mg of Vitamin D per day.  If you are older than age 70, take 1200 mg of calcium and at least 800 mg of Vitamin D per day. Smoking and excessive alcohol intake increases the risk of osteoporosis. Eat foods rich in calcium and vitamin D and do weight bearing exercises several times a week as your caregiver suggests. DIABETES Diabetes Mellitus: If you have type I or type 2 diabetes, you should keep your blood sugar under control with diet, exercise, and recommended medication. Avoid starchy and fatty foods, and too many sweets. Being overweight can make diabetes control more difficult. COGNITION AND MEMORY Cognition and Memory: Menopausal hormone therapy is not recommended for the prevention of cognitive disorders such as Alzheimer's disease or memory loss.  DEPRESSION  Depression may occur at any age, but it is common in elderly women. This may be because of physical, medical, social (loneliness), or financial problems and needs. If you are experiencing depression because of medical problems and control of symptoms, talk to your caregiver about this. Physical   activity and exercise may help with mood and sleep. Community and volunteer involvement may improve your sense of value and worth. If you have depression and you feel that the problem is getting worse or becoming severe, talk to your caregiver about which treatment options are best for you. ACCIDENTS  Accidents are common and can be serious in elderly woman. Prepare your house to prevent accidents. Eliminate throw rugs, place hand bars in bath, shower, and toilet areas. Avoid wearing high heeled shoes or walking on wet, snowy, and icy areas. Limit or stop driving if you have vision or hearing problems, or if you feel you are unsteady with your movements and  reflexes. HEPATITIS C Hepatitis C is a type of viral infection affecting the liver. It is spread mainly through contact with blood from an infected person. It can be treated, but if left untreated, it can lead to severe liver damage over the years. Many people who are infected do not know that the virus is in their blood. If you are a "baby-boomer", it is recommended that you have one screening test for Hepatitis C. IMMUNIZATIONS  Several immunizations are important to consider having during your senior years, including:   Tetanus, diphtheria, and pertussis booster shot.  Influenza every year before the flu season begins.  Pneumonia vaccine.  Shingles vaccine.  Others, as indicated based on your specific needs. Talk to your caregiver about these. Document Released: 07/30/2005 Document Revised: 10/22/2013 Document Reviewed: 03/25/2008 Exit[employer] Patient Information 2015 [employer]. This information is not intended to replace advice given to you by your health care provider. Make sure you discuss any questions you have with your health care provider.  

## 2014-07-03 NOTE — Progress Notes (Signed)
Gloria Mccoy 11-Apr-1946 [id number]    History:    Presents for annual exam.  Postmenopausal/no HRT/no bleeding. Normal Pap and mammogram history. 06/2014 T score -1.1 femoral neck FRAX 6.1%/0.6%. Hypothyroid primary care manages. Has had Zostavax and Pneumovax. History of Mnire's and arthritis.  Past medical history, past surgical history, family history and social history were all reviewed and documented in the EPIC chart. Retired from a Psychologist, educational job. 2 children both doing well, no grandchildren.  ROS:  A ROS was performed and pertinent positives and negatives are included.  Exam:  Filed Vitals:   07/03/14 0841  BP: 130/80    General appearance:  Normal Thyroid:  Symmetrical, normal in size, without palpable masses or nodularity. Respiratory  Auscultation:  Clear without wheezing or rhonchi Cardiovascular  Auscultation:  Regular rate, without rubs, murmurs or gallops  Edema/varicosities:  Not grossly evident Abdominal  Soft,nontender, without masses, guarding or rebound.  Liver/spleen:  No organomegaly noted  Hernia:  None appreciated  Skin  Inspection:  Grossly normal   Breasts: Examined lying and sitting.     Right: Without masses, retractions, discharge or axillary adenopathy.     Left: Without masses, retractions, discharge or axillary adenopathy. Gentitourinary   Inguinal/mons:  Normal without inguinal adenopathy  External genitalia:  Normal  BUS/Urethra/Skene's glands:  Normal  Vagina:  Normal  Cervix:  Normal  Uterus:   normal in size, shape and contour.  Midline and mobile  Adnexa/parametria:     Rt: Without masses or tenderness.   Lt: Without masses or tenderness.  Anus and perineum: Normal  Digital rectal exam: Normal sphincter tone without palpated masses or tenderness  Assessment/Plan:  69 y.o. MBF G2 P2 for annual GYN exam no complaints.  Osteopenia without elevated FRAX Postmenopausal/no HRT/no bleeding Hypothyroid-primary care manages labs  and meds  Plan: SBE's, continue annual screening mammogram, calcium rich diet, vitamin D 2000 daily encouraged. Reviewed importance of increasing regular exercise and decreasing calories for weight loss. Home safety, fall prevention reviewed. UA, Pap normal with negative HR HPV 2013, new screening guidelines reviewed.  Huel Cote [facility], 10:51 AM 07/03/2014

## 2014-07-04 LAB — URINALYSIS W MICROSCOPIC + REFLEX CULTURE
BACTERIA UA: NONE SEEN
BILIRUBIN URINE: NEGATIVE
CASTS: NONE SEEN
CRYSTALS: NONE SEEN
GLUCOSE, UA: NEGATIVE mg/dL
Hgb urine dipstick: NEGATIVE
Ketones, ur: NEGATIVE mg/dL
NITRITE: NEGATIVE
Protein, ur: NEGATIVE mg/dL
SPECIFIC GRAVITY, URINE: 1.013 (ref 1.005–1.030)
UROBILINOGEN UA: 0.2 mg/dL (ref 0.0–1.0)
pH: 6.5 (ref 5.0–8.0)

## 2014-07-05 LAB — URINE CULTURE
COLONY COUNT: NO GROWTH
Organism ID, Bacteria: NO GROWTH

## 2014-11-19 DIAGNOSIS — J301 Allergic rhinitis due to pollen: Secondary | ICD-10-CM | POA: Diagnosis not present

## 2014-11-19 DIAGNOSIS — Z6832 Body mass index (BMI) 32.0-32.9, adult: Secondary | ICD-10-CM | POA: Diagnosis not present

## 2014-11-19 DIAGNOSIS — J209 Acute bronchitis, unspecified: Secondary | ICD-10-CM | POA: Diagnosis not present

## 2014-11-19 DIAGNOSIS — E6609 Other obesity due to excess calories: Secondary | ICD-10-CM | POA: Diagnosis not present

## 2014-11-22 DIAGNOSIS — E6609 Other obesity due to excess calories: Secondary | ICD-10-CM | POA: Diagnosis not present

## 2014-11-22 DIAGNOSIS — J209 Acute bronchitis, unspecified: Secondary | ICD-10-CM | POA: Diagnosis not present

## 2014-11-22 DIAGNOSIS — Z6833 Body mass index (BMI) 33.0-33.9, adult: Secondary | ICD-10-CM | POA: Diagnosis not present

## 2014-12-20 DIAGNOSIS — Z6832 Body mass index (BMI) 32.0-32.9, adult: Secondary | ICD-10-CM | POA: Diagnosis not present

## 2014-12-20 DIAGNOSIS — Z1389 Encounter for screening for other disorder: Secondary | ICD-10-CM | POA: Diagnosis not present

## 2014-12-20 DIAGNOSIS — E6609 Other obesity due to excess calories: Secondary | ICD-10-CM | POA: Diagnosis not present

## 2014-12-20 DIAGNOSIS — H3530 Unspecified macular degeneration: Secondary | ICD-10-CM | POA: Diagnosis not present

## 2014-12-26 DIAGNOSIS — H25013 Cortical age-related cataract, bilateral: Secondary | ICD-10-CM | POA: Diagnosis not present

## 2014-12-26 DIAGNOSIS — H2513 Age-related nuclear cataract, bilateral: Secondary | ICD-10-CM | POA: Diagnosis not present

## 2015-01-07 DIAGNOSIS — H25011 Cortical age-related cataract, right eye: Secondary | ICD-10-CM | POA: Diagnosis not present

## 2015-01-07 DIAGNOSIS — H2511 Age-related nuclear cataract, right eye: Secondary | ICD-10-CM | POA: Diagnosis not present

## 2015-01-20 NOTE — Patient Instructions (Signed)
Your procedure is scheduled on: 01/28/2015  Report to [facility] at  900 AM    Call this number if you have problems the morning of surgery: [phone]   Do not eat food or drink liquids :After Midnight.      Take these medicines the morning of surgery with A SIP OF WATER: synthroid   Do not wear jewelry, make-up or nail polish.  Do not wear lotions, powders, or perfumes.   Do not shave 48 hours prior to surgery.  Do not bring valuables to the hospital.  Contacts, dentures or bridgework may not be worn into surgery.  Leave suitcase in the car. After surgery it may be brought to your room.  For patients admitted to the hospital, checkout time is 11:00 AM the day of discharge.   Patients discharged the day of surgery will not be allowed to drive home.  :     Please read over the following fact sheets that you were given: Coughing and Deep Breathing, Surgical Site Infection Prevention, Anesthesia Post-op Instructions and Care and Recovery After Surgery    Cataract A cataract is a clouding of the lens of the eye. When a lens becomes cloudy, vision is reduced based on the degree and nature of the clouding. Many cataracts reduce vision to some degree. Some cataracts make people more near-sighted as they develop. Other cataracts increase glare. Cataracts that are ignored and become worse can sometimes look white. The white color can be seen through the pupil. CAUSES   Aging. However, cataracts may occur at any age, even in newborns.   Certain drugs.   Trauma to the eye.   Certain diseases such as diabetes.   Specific eye diseases such as chronic inflammation inside the eye or a sudden attack of a rare form of glaucoma.   Inherited or acquired medical problems.  SYMPTOMS   Gradual, progressive drop in vision in the affected eye.   Severe, rapid visual loss. This most often happens when trauma is the cause.  DIAGNOSIS  To detect a cataract, an eye doctor examines the lens. Cataracts  are best diagnosed with an exam of the eyes with the pupils enlarged (dilated) by drops.  TREATMENT  For an early cataract, vision may improve by using different eyeglasses or stronger lighting. If that does not help your vision, surgery is the only effective treatment. A cataract needs to be surgically removed when vision loss interferes with your everyday activities, such as driving, reading, or watching TV. A cataract may also have to be removed if it prevents examination or treatment of another eye problem. Surgery removes the cloudy lens and usually replaces it with a substitute lens (intraocular lens, IOL).  At a time when both you and your doctor agree, the cataract will be surgically removed. If you have cataracts in both eyes, only one is usually removed at a time. This allows the operated eye to heal and be out of danger from any possible problems after surgery (such as infection or poor wound healing). In rare cases, a cataract may be doing damage to your eye. In these cases, your caregiver may advise surgical removal right away. The vast majority of people who have cataract surgery have better vision afterward. HOME CARE INSTRUCTIONS  If you are not planning surgery, you may be asked to do the following:  Use different eyeglasses.   Use stronger or brighter lighting.   Ask your eye doctor about reducing your medicine dose or changing medicines  if it is thought that a medicine caused your cataract. Changing medicines does not make the cataract go away on its own.   Become familiar with your surroundings. Poor vision can lead to injury. Avoid bumping into things on the affected side. You are at a higher risk for tripping or falling.   Exercise extreme care when driving or operating machinery.   Wear sunglasses if you are sensitive to bright light or experiencing problems with glare.  SEEK IMMEDIATE MEDICAL CARE IF:   You have a worsening or sudden vision loss.   You notice redness,  swelling, or increasing pain in the eye.   You have a fever.  Document Released: 06/07/2005 Document Revised: 05/27/2011 Document Reviewed: 01/29/2011 [facility] Patient Information 2012 [place].PATIENT INSTRUCTIONS POST-ANESTHESIA  IMMEDIATELY FOLLOWING SURGERY:  Do not drive or operate machinery for the first twenty four hours after surgery.  Do not make any important decisions for twenty four hours after surgery or while taking narcotic pain medications or sedatives.  If you develop intractable nausea and vomiting or a severe headache please notify your doctor immediately.  FOLLOW-UP:  Please make an appointment with your surgeon as instructed. You do not need to follow up with anesthesia unless specifically instructed to do so.  WOUND CARE INSTRUCTIONS (if applicable):  Keep a dry clean dressing on the anesthesia/puncture wound site if there is drainage.  Once the wound has quit draining you may leave it open to air.  Generally you should leave the bandage intact for twenty four hours unless there is drainage.  If the epidural site drains for more than 36-48 hours please call the anesthesia department.  QUESTIONS?:  Please feel free to call your physician or the hospital operator if you have any questions, and they will be happy to assist you.

## 2015-01-22 ENCOUNTER — Encounter (HOSPITAL_COMMUNITY): Payer: Self-pay

## 2015-01-22 ENCOUNTER — Encounter (HOSPITAL_COMMUNITY)
Admission: RE | Admit: 2015-01-22 | Discharge: 2015-01-22 | Disposition: A | Discharge status: Home / Self Care | Payer: Commercial Managed Care - HMO | Source: Ambulatory Visit | Attending: Ophthalmology | Admitting: Ophthalmology

## 2015-01-22 DIAGNOSIS — H2511 Age-related nuclear cataract, right eye: Secondary | ICD-10-CM | POA: Insufficient documentation

## 2015-01-22 DIAGNOSIS — Z01818 Encounter for other preprocedural examination: Secondary | ICD-10-CM | POA: Insufficient documentation

## 2015-01-22 LAB — CBC WITH DIFFERENTIAL/PLATELET
BASOS ABS: 0 10*3/uL (ref 0.0–0.1)
Basophils Relative: 0 % (ref 0–1)
Eosinophils Absolute: 0.2 10*3/uL (ref 0.0–0.7)
Eosinophils Relative: 2 % (ref 0–5)
HCT: 37.2 % (ref 36.0–46.0)
HEMOGLOBIN: 13 g/dL (ref 12.0–15.0)
Lymphocytes Relative: 39 % (ref 12–46)
Lymphs Abs: 4.1 10*3/uL — ABNORMAL HIGH (ref 0.7–4.0)
MCH: 30.2 pg (ref 26.0–34.0)
MCHC: 34.9 g/dL (ref 30.0–36.0)
MCV: 86.5 fL (ref 78.0–100.0)
Monocytes Absolute: 1 10*3/uL (ref 0.1–1.0)
Monocytes Relative: 9 % (ref 3–12)
Neutro Abs: 5.2 10*3/uL (ref 1.7–7.7)
Neutrophils Relative %: 50 % (ref 43–77)
PLATELETS: 248 10*3/uL (ref 150–400)
RBC: 4.3 MIL/uL (ref 3.87–5.11)
RDW: 13.7 % (ref 11.5–15.5)
WBC: 10.5 10*3/uL (ref 4.0–10.5)

## 2015-01-22 LAB — BASIC METABOLIC PANEL
Anion gap: 9 (ref 5–15)
BUN: 15 mg/dL (ref 6–20)
CHLORIDE: 101 mmol/L (ref 101–111)
CO2: 29 mmol/L (ref 22–32)
Calcium: 8.9 mg/dL (ref 8.9–10.3)
Creatinine, Ser: 1.22 mg/dL — ABNORMAL HIGH (ref 0.44–1.00)
GFR calc Af Amer: 51 mL/min — ABNORMAL LOW (ref >60)
GFR calc non Af Amer: 44 mL/min — ABNORMAL LOW (ref >60)
GLUCOSE: 115 mg/dL — AB (ref 65–99)
POTASSIUM: 3.5 mmol/L (ref 3.5–5.1)
SODIUM: 139 mmol/L (ref 135–145)

## 2015-01-22 NOTE — Pre-Procedure Instructions (Signed)
Patient given information to sign up for my chart at home. 

## 2015-01-23 ENCOUNTER — Encounter (HOSPITAL_COMMUNITY)
Admission: RE | Admit: 2015-01-23 | Discharge: 2015-01-23 | Disposition: A | Discharge status: Home / Self Care | Payer: Commercial Managed Care - HMO | Source: Ambulatory Visit | Attending: Ophthalmology | Admitting: Ophthalmology

## 2015-01-27 MED ORDER — TETRACAINE HCL 0.5 % OP SOLN
OPHTHALMIC | Status: AC
  Filled 2015-01-27: qty 2

## 2015-01-27 MED ORDER — KETOROLAC TROMETHAMINE 0.5 % OP SOLN
OPHTHALMIC | Status: AC
  Filled 2015-01-27: qty 5

## 2015-01-27 MED ORDER — PHENYLEPHRINE HCL 2.5 % OP SOLN
OPHTHALMIC | Status: AC
  Filled 2015-01-27: qty 15

## 2015-01-27 MED ORDER — CYCLOPENTOLATE-PHENYLEPHRINE OP SOLN OPTIME - NO CHARGE
OPHTHALMIC | Status: AC
  Filled 2015-01-27: qty 2

## 2015-01-28 ENCOUNTER — Encounter (HOSPITAL_COMMUNITY)
Admission: RE | Disposition: A | Discharge status: Home / Self Care | Payer: Self-pay | Source: Ambulatory Visit | Attending: Ophthalmology

## 2015-01-28 ENCOUNTER — Encounter (HOSPITAL_COMMUNITY): Payer: Self-pay | Admitting: *Deleted

## 2015-01-28 ENCOUNTER — Ambulatory Visit (HOSPITAL_COMMUNITY): Payer: Commercial Managed Care - HMO | Admitting: Anesthesiology

## 2015-01-28 ENCOUNTER — Ambulatory Visit (HOSPITAL_COMMUNITY)
Admission: RE | Admit: 2015-01-28 | Discharge: 2015-01-28 | Disposition: A | Discharge status: Home / Self Care | Payer: Commercial Managed Care - HMO | Source: Ambulatory Visit | Attending: Ophthalmology | Admitting: Ophthalmology

## 2015-01-28 DIAGNOSIS — H2511 Age-related nuclear cataract, right eye: Secondary | ICD-10-CM | POA: Insufficient documentation

## 2015-01-28 DIAGNOSIS — E039 Hypothyroidism, unspecified: Secondary | ICD-10-CM | POA: Insufficient documentation

## 2015-01-28 DIAGNOSIS — M1990 Unspecified osteoarthritis, unspecified site: Secondary | ICD-10-CM | POA: Insufficient documentation

## 2015-01-28 DIAGNOSIS — H269 Unspecified cataract: Secondary | ICD-10-CM | POA: Diagnosis not present

## 2015-01-28 DIAGNOSIS — H8109 Meniere's disease, unspecified ear: Secondary | ICD-10-CM | POA: Insufficient documentation

## 2015-01-28 DIAGNOSIS — H25012 Cortical age-related cataract, left eye: Secondary | ICD-10-CM | POA: Diagnosis not present

## 2015-01-28 DIAGNOSIS — H25011 Cortical age-related cataract, right eye: Secondary | ICD-10-CM | POA: Diagnosis not present

## 2015-01-28 DIAGNOSIS — Z882 Allergy status to sulfonamides status: Secondary | ICD-10-CM | POA: Insufficient documentation

## 2015-01-28 DIAGNOSIS — Z7982 Long term (current) use of aspirin: Secondary | ICD-10-CM | POA: Insufficient documentation

## 2015-01-28 DIAGNOSIS — H2512 Age-related nuclear cataract, left eye: Secondary | ICD-10-CM | POA: Diagnosis not present

## 2015-01-28 HISTORY — PX: CATARACT EXTRACTION W/PHACO: SHX586

## 2015-01-28 SURGERY — PHACOEMULSIFICATION, CATARACT, WITH IOL INSERTION
Anesthesia: Monitor Anesthesia Care | Site: Eye | Laterality: Right

## 2015-01-28 MED ORDER — CYCLOPENTOLATE-PHENYLEPHRINE 0.2-1 % OP SOLN
1.0000 [drp] | OPHTHALMIC | Status: AC
  Administered 2015-01-28 (×3): 1 [drp] via OPHTHALMIC

## 2015-01-28 MED ORDER — MIDAZOLAM HCL 2 MG/2ML IJ SOLN
INTRAMUSCULAR | Status: AC
  Filled 2015-01-28: qty 2

## 2015-01-28 MED ORDER — TETRACAINE 0.5 % OP SOLN OPTIME - NO CHARGE
OPHTHALMIC | Status: DC | PRN
  Administered 2015-01-28: 1 [drp] via OPHTHALMIC

## 2015-01-28 MED ORDER — FENTANYL CITRATE (PF) 100 MCG/2ML IJ SOLN
25.0000 ug | INTRAMUSCULAR | Status: AC
  Administered 2015-01-28 (×2): 25 ug via INTRAVENOUS

## 2015-01-28 MED ORDER — PHENYLEPHRINE HCL 2.5 % OP SOLN
1.0000 [drp] | OPHTHALMIC | Status: AC
  Administered 2015-01-28 (×3): 1 [drp] via OPHTHALMIC

## 2015-01-28 MED ORDER — MIDAZOLAM HCL 2 MG/2ML IJ SOLN
1.0000 mg | INTRAMUSCULAR | Status: DC | PRN
Start: 2015-01-28 — End: 2015-01-30
  Administered 2015-01-28: 2 mg via INTRAVENOUS

## 2015-01-28 MED ORDER — TETRACAINE HCL 0.5 % OP SOLN
1.0000 [drp] | OPHTHALMIC | Status: AC
  Administered 2015-01-28 (×3): 1 [drp] via OPHTHALMIC

## 2015-01-28 MED ORDER — BSS IO SOLN
INTRAOCULAR | Status: DC | PRN
  Administered 2015-01-28: 15 mL via INTRAOCULAR

## 2015-01-28 MED ORDER — BSS IO SOLN
INTRAOCULAR | Status: DC | PRN
  Administered 2015-01-28: 500 mL

## 2015-01-28 MED ORDER — PROVISC 10 MG/ML IO SOLN
INTRAOCULAR | Status: DC | PRN
  Administered 2015-01-28: 0.85 mL via INTRAOCULAR

## 2015-01-28 MED ORDER — KETOROLAC TROMETHAMINE 0.5 % OP SOLN
1.0000 [drp] | OPHTHALMIC | Status: AC
  Administered 2015-01-28 (×3): 1 [drp] via OPHTHALMIC

## 2015-01-28 MED ORDER — LACTATED RINGERS IV SOLN
INTRAVENOUS | Status: DC
  Administered 2015-01-28: 1000 mL via INTRAVENOUS

## 2015-01-28 MED ORDER — EPINEPHRINE HCL 1 MG/ML IJ SOLN
INTRAMUSCULAR | Status: AC
  Filled 2015-01-28: qty 1

## 2015-01-28 MED ORDER — FENTANYL CITRATE (PF) 100 MCG/2ML IJ SOLN
INTRAMUSCULAR | Status: AC
  Filled 2015-01-28: qty 2

## 2015-01-28 SURGICAL SUPPLY — 25 items
CAPSULAR TENSION RING-AMO (OPHTHALMIC RELATED) IMPLANT
CLOTH BEACON ORANGE TIMEOUT ST (SAFETY) ×3 IMPLANT
EYE SHIELD UNIVERSAL CLEAR (GAUZE/BANDAGES/DRESSINGS) ×3 IMPLANT
GLOVE BIO SURGEON STRL SZ 6.5 (GLOVE) ×2 IMPLANT
GLOVE BIO SURGEONS STRL SZ 6.5 (GLOVE) ×1
GLOVE ECLIPSE 6.5 STRL STRAW (GLOVE) IMPLANT
GLOVE ECLIPSE 7.0 STRL STRAW (GLOVE) IMPLANT
GLOVE EXAM NITRILE LRG STRL (GLOVE) IMPLANT
GLOVE EXAM NITRILE MD LF STRL (GLOVE) ×3 IMPLANT
GLOVE SKINSENSE NS SZ6.5 (GLOVE)
GLOVE SKINSENSE STRL SZ6.5 (GLOVE) IMPLANT
HEALON 5 0.6 ML (INTRAOCULAR LENS) IMPLANT
KIT VITRECTOMY (OPHTHALMIC RELATED) IMPLANT
LENS ALC ACRYL/TECN (Ophthalmic Related) ×3 IMPLANT
PAD ARMBOARD 7.5X6 YLW CONV (MISCELLANEOUS) ×3 IMPLANT
PROC W NO LENS (INTRAOCULAR LENS)
PROC W SPEC LENS (INTRAOCULAR LENS)
PROCESS W NO LENS (INTRAOCULAR LENS) IMPLANT
PROCESS W SPEC LENS (INTRAOCULAR LENS) IMPLANT
RETRACTOR IRIS SIGHTPATH (OPHTHALMIC RELATED) IMPLANT
RING MALYGIN (MISCELLANEOUS) IMPLANT
TAPE SURG TRANSPORE 1 IN (GAUZE/BANDAGES/DRESSINGS) ×1 IMPLANT
TAPE SURGICAL TRANSPORE 1 IN (GAUZE/BANDAGES/DRESSINGS) ×2
VISCOELASTIC ADDITIONAL (OPHTHALMIC RELATED) IMPLANT
WATER STERILE IRR 250ML POUR (IV SOLUTION) ×3 IMPLANT

## 2015-01-28 NOTE — Op Note (Signed)
Patient brought to the operating room and prepped and draped in the usual manner.  Lid speculum inserted in right eye.  Stab incision made at the twelve o'clock position.  Provisc instilled in the anterior chamber.   A 2.4 mm. Stab incision was made temporally.  An anterior capsulotomy was done with a bent 25 gauge needle.  The nucleus was hydrodissected.  The Phaco tip was inserted in the anterior chamber and the nucleus was emulsified.  CDE was 5.70.  The cortical material was then removed with the I and A tip.  Posterior capsule was the polished.  The anterior chamber was deepened with Provisc.  A 22.0 Diopter Alcon SN60WF IOL was then inserted in the capsular bag.  Provisc was then removed with the I and A tip.  The wound was then hydrated.  Patient sent to the Recovery Room in good condition with follow up in my office.  Preoperative Diagnosis:  Nuclear Cataract OD Postoperative Diagnosis:  Same Procedure name: Kelman Phacoemulsification OD with IOL

## 2015-01-28 NOTE — Discharge Instructions (Signed)
[name]  01/28/2015           [facility] Instructions [place] [address]      1. Avoid closing eyes tightly. One often closes the eye tightly when laughing, talking, sneezing, coughing or if they feel irritated. At these times, you should be careful not to close your eyes tightly.  2. Instill eye drops as instructed. To instill drops in your eye, open it, look up and have someone gently pull the lower lid down and instill a couple of drops inside the lower lid.  3. Do not touch upper lid.  4. Take Advil or Tylenol for pain.  5. You may use either eye for near work, such as reading or sewing and you may watch television.  6. You may have your hair done at the beauty parlor at any time.  7. Wear dark glasses with or without your own glasses if you are in bright light.  8. Call our office at [phone] or [phone] if you have sharp pain in your eye or unusual symptoms.  9. Do not be concerned because vision in the operative eye is not good. It will not be good, no matter how successful the operation, until you get a special lens for it. Your old glasses will not be suited to the new eye that was operated on and you will not be ready for a new lens for about a month.  10. Follow up at the [facility] office.    I have received a copy of the above instructions and will follow them.

## 2015-01-28 NOTE — Anesthesia Postprocedure Evaluation (Signed)
  Anesthesia Post-op Note  Patient: Gloria Mccoy  Procedure(s) Performed: Procedure(s): CATARACT EXTRACTION PHACO AND INTRAOCULAR LENS PLACEMENT :  CDE:  5.70 (Right)  Patient Location: Short Stay  Anesthesia Type:MAC  Level of Consciousness: awake, alert , oriented and patient cooperative  Airway and Oxygen Therapy: Patient Spontanous Breathing  Post-op Pain: none  Post-op Assessment: Post-op Vital signs reviewed, Patient's Cardiovascular Status Stable, Respiratory Function Stable, Patent Airway, No signs of Nausea or vomiting, Adequate PO intake and Pain level controlled              Post-op Vital Signs: Reviewed and stable  Last Vitals:  Filed Vitals:   01/28/15 1000  BP: 145/67  Temp:   Resp: 16    Complications: No apparent anesthesia complications

## 2015-01-28 NOTE — H&P (Signed)
The patient was re examined and there is no change in the patients condition since the original H and P. 

## 2015-01-28 NOTE — Transfer of Care (Signed)
Immediate Anesthesia Transfer of Care Note  Patient: Gloria Mccoy  Procedure(s) Performed: Procedure(s): CATARACT EXTRACTION PHACO AND INTRAOCULAR LENS PLACEMENT :  CDE:  5.70 (Right)  Patient Location: Short Stay  Anesthesia Type:MAC  Level of Consciousness: awake, alert , oriented and patient cooperative  Airway & Oxygen Therapy: Patient Spontanous Breathing  Post-op Assessment: Report given to RN, Post -op Vital signs reviewed and stable and Patient moving all extremities  Post vital signs: Reviewed and stable  Last Vitals:  Filed Vitals:   01/28/15 1000  BP: 145/67  Temp:   Resp: 16    Complications: No apparent anesthesia complications

## 2015-01-28 NOTE — Anesthesia Preprocedure Evaluation (Addendum)
Anesthesia Evaluation  Patient identified by MRN, date of birth, ID band Patient awake    Reviewed: Allergy & Precautions, NPO status , Patient's Chart, lab work & pertinent test results  Airway Mallampati: II  TM Distance: >3 FB     Dental  (+) Teeth Intact   Pulmonary neg pulmonary ROS,  breath sounds clear to auscultation        Cardiovascular negative cardio ROS  Rhythm:Regular Rate:Normal     Neuro/Psych    GI/Hepatic   Endo/Other  Hypothyroidism   Renal/GU      Musculoskeletal  (+) Arthritis -,   Abdominal   Peds  Hematology   Anesthesia Other Findings Meniere's disease  Reproductive/Obstetrics                             Anesthesia Physical Anesthesia Plan  ASA: II  Anesthesia Plan:    Post-op Pain Management:    Induction:   Airway Management Planned:   Additional Equipment:   Intra-op Plan:   Post-operative Plan:   Informed Consent:   Plan Discussed with:   Anesthesia Plan Comments:         Anesthesia Quick Evaluation

## 2015-01-29 ENCOUNTER — Encounter (HOSPITAL_COMMUNITY): Payer: Self-pay | Admitting: Ophthalmology

## 2015-02-04 ENCOUNTER — Encounter (HOSPITAL_COMMUNITY): Payer: Self-pay

## 2015-02-04 ENCOUNTER — Encounter (HOSPITAL_COMMUNITY)
Admission: RE | Admit: 2015-02-04 | Discharge: 2015-02-04 | Disposition: A | Discharge status: Home / Self Care | Payer: Commercial Managed Care - HMO | Source: Ambulatory Visit | Attending: Ophthalmology | Admitting: Ophthalmology

## 2015-02-04 NOTE — Pre-Procedure Instructions (Signed)
Telephone interview conducted.  Patient verbalized understanding of instructions given.

## 2015-02-10 MED ORDER — TETRACAINE HCL 0.5 % OP SOLN
OPHTHALMIC | Status: AC
  Filled 2015-02-10: qty 2

## 2015-02-10 MED ORDER — KETOROLAC TROMETHAMINE 0.5 % OP SOLN
OPHTHALMIC | Status: AC
  Filled 2015-02-10: qty 5

## 2015-02-10 MED ORDER — PHENYLEPHRINE HCL 2.5 % OP SOLN
OPHTHALMIC | Status: AC
  Filled 2015-02-10: qty 15

## 2015-02-10 MED ORDER — CYCLOPENTOLATE-PHENYLEPHRINE OP SOLN OPTIME - NO CHARGE
OPHTHALMIC | Status: AC
  Filled 2015-02-10: qty 2

## 2015-02-11 ENCOUNTER — Ambulatory Visit (HOSPITAL_COMMUNITY): Payer: Commercial Managed Care - HMO | Admitting: Anesthesiology

## 2015-02-11 ENCOUNTER — Ambulatory Visit (HOSPITAL_COMMUNITY)
Admission: RE | Admit: 2015-02-11 | Discharge: 2015-02-11 | Disposition: A | Discharge status: Home / Self Care | Payer: Commercial Managed Care - HMO | Source: Ambulatory Visit | Attending: Ophthalmology | Admitting: Ophthalmology

## 2015-02-11 ENCOUNTER — Encounter (HOSPITAL_COMMUNITY)
Admission: RE | Disposition: A | Discharge status: Home / Self Care | Payer: Self-pay | Source: Ambulatory Visit | Attending: Ophthalmology

## 2015-02-11 ENCOUNTER — Encounter (HOSPITAL_COMMUNITY): Payer: Self-pay | Admitting: *Deleted

## 2015-02-11 ENCOUNTER — Other Ambulatory Visit (HOSPITAL_COMMUNITY): Payer: Commercial Managed Care - HMO

## 2015-02-11 DIAGNOSIS — Z791 Long term (current) use of non-steroidal anti-inflammatories (NSAID): Secondary | ICD-10-CM | POA: Diagnosis not present

## 2015-02-11 DIAGNOSIS — Z79899 Other long term (current) drug therapy: Secondary | ICD-10-CM | POA: Insufficient documentation

## 2015-02-11 DIAGNOSIS — Z7982 Long term (current) use of aspirin: Secondary | ICD-10-CM | POA: Insufficient documentation

## 2015-02-11 DIAGNOSIS — H2512 Age-related nuclear cataract, left eye: Secondary | ICD-10-CM | POA: Insufficient documentation

## 2015-02-11 DIAGNOSIS — E039 Hypothyroidism, unspecified: Secondary | ICD-10-CM | POA: Insufficient documentation

## 2015-02-11 DIAGNOSIS — H25012 Cortical age-related cataract, left eye: Secondary | ICD-10-CM | POA: Diagnosis not present

## 2015-02-11 DIAGNOSIS — H5703 Miosis: Secondary | ICD-10-CM | POA: Diagnosis not present

## 2015-02-11 DIAGNOSIS — M1990 Unspecified osteoarthritis, unspecified site: Secondary | ICD-10-CM | POA: Insufficient documentation

## 2015-02-11 DIAGNOSIS — H269 Unspecified cataract: Secondary | ICD-10-CM | POA: Diagnosis not present

## 2015-02-11 HISTORY — PX: CATARACT EXTRACTION W/PHACO: SHX586

## 2015-02-11 SURGERY — PHACOEMULSIFICATION, CATARACT, WITH IOL INSERTION
Anesthesia: Monitor Anesthesia Care | Site: Eye | Laterality: Left

## 2015-02-11 MED ORDER — PHENYLEPHRINE HCL 2.5 % OP SOLN
1.0000 [drp] | OPHTHALMIC | Status: AC
  Administered 2015-02-11 (×3): 1 [drp] via OPHTHALMIC

## 2015-02-11 MED ORDER — MIDAZOLAM HCL 2 MG/2ML IJ SOLN
INTRAMUSCULAR | Status: AC
  Filled 2015-02-11: qty 2

## 2015-02-11 MED ORDER — LACTATED RINGERS IV SOLN
INTRAVENOUS | Status: DC
  Administered 2015-02-11: 09:00:00 via INTRAVENOUS

## 2015-02-11 MED ORDER — FENTANYL CITRATE (PF) 100 MCG/2ML IJ SOLN
25.0000 ug | INTRAMUSCULAR | Status: AC
  Administered 2015-02-11: 25 ug via INTRAVENOUS

## 2015-02-11 MED ORDER — PROVISC 10 MG/ML IO SOLN
INTRAOCULAR | Status: DC | PRN
  Administered 2015-02-11: 0.85 mL via INTRAOCULAR

## 2015-02-11 MED ORDER — EPINEPHRINE HCL 1 MG/ML IJ SOLN
INTRAMUSCULAR | Status: AC
Start: 2015-02-11 — End: 2015-02-11
  Filled 2015-02-11: qty 1

## 2015-02-11 MED ORDER — MIDAZOLAM HCL 2 MG/2ML IJ SOLN
1.0000 mg | INTRAMUSCULAR | Status: DC | PRN
  Administered 2015-02-11: 2 mg via INTRAVENOUS

## 2015-02-11 MED ORDER — CYCLOPENTOLATE-PHENYLEPHRINE 0.2-1 % OP SOLN
1.0000 [drp] | OPHTHALMIC | Status: AC
  Administered 2015-02-11 (×3): 1 [drp] via OPHTHALMIC

## 2015-02-11 MED ORDER — TETRACAINE 0.5 % OP SOLN OPTIME - NO CHARGE
OPHTHALMIC | Status: DC | PRN
  Administered 2015-02-11: 1 [drp] via OPHTHALMIC

## 2015-02-11 MED ORDER — FENTANYL CITRATE (PF) 100 MCG/2ML IJ SOLN
INTRAMUSCULAR | Status: AC
  Filled 2015-02-11: qty 2

## 2015-02-11 MED ORDER — EPINEPHRINE HCL 1 MG/ML IJ SOLN
INTRAOCULAR | Status: DC | PRN
  Administered 2015-02-11: 500 mL

## 2015-02-11 MED ORDER — LIDOCAINE HCL (PF) 1 % IJ SOLN
INTRAMUSCULAR | Status: DC | PRN
  Administered 2015-02-11: .5 mL

## 2015-02-11 MED ORDER — LIDOCAINE HCL 3.5 % OP GEL: 1.0000 "application " | Freq: Once | OPHTHALMIC | Status: DC

## 2015-02-11 MED ORDER — KETOROLAC TROMETHAMINE 0.5 % OP SOLN
1.0000 [drp] | OPHTHALMIC | Status: AC
  Administered 2015-02-11 (×3): 1 [drp] via OPHTHALMIC

## 2015-02-11 MED ORDER — LIDOCAINE HCL (PF) 1 % IJ SOLN
INTRAMUSCULAR | Status: AC
  Filled 2015-02-11: qty 2

## 2015-02-11 MED ORDER — BSS IO SOLN
INTRAOCULAR | Status: DC | PRN
  Administered 2015-02-11: 15 mL

## 2015-02-11 MED ORDER — TETRACAINE HCL 0.5 % OP SOLN
1.0000 [drp] | OPHTHALMIC | Status: AC
  Administered 2015-02-11 (×3): 1 [drp] via OPHTHALMIC

## 2015-02-11 SURGICAL SUPPLY — 10 items
CLOTH BEACON ORANGE TIMEOUT ST (SAFETY) ×3 IMPLANT
EYE SHIELD UNIVERSAL CLEAR (GAUZE/BANDAGES/DRESSINGS) ×3 IMPLANT
GLOVE BIO SURGEON STRL SZ 6.5 (GLOVE) ×2 IMPLANT
GLOVE BIO SURGEONS STRL SZ 6.5 (GLOVE) ×1
GLOVE EXAM NITRILE MD LF STRL (GLOVE) ×3 IMPLANT
LENS ALC ACRYL/TECN (Ophthalmic Related) ×3 IMPLANT
PAD ARMBOARD 7.5X6 YLW CONV (MISCELLANEOUS) ×3 IMPLANT
TAPE SURG TRANSPORE 1 IN (GAUZE/BANDAGES/DRESSINGS) ×1 IMPLANT
TAPE SURGICAL TRANSPORE 1 IN (GAUZE/BANDAGES/DRESSINGS) ×2
WATER STERILE IRR 250ML POUR (IV SOLUTION) ×3 IMPLANT

## 2015-02-11 NOTE — Op Note (Signed)
Patient brought to the operating room and prepped and draped in the usual manner. Lid speculum inserted in left eye. Stab incision made at the twelve o'clock position. Provisc instilled in the anterior chamber. A 2.4 mm. Stab incision was made temporally. Due to a small pupil, a Malugyn Ring was inserted.  An anterior capsulotomy was done with a bent 25 gauge needle. The nucleus was hydrodissected. The Phaco tip was inserted in the anterior chamber and the nucleus was emulsified. CDE was 7.38. The cortical material was then removed with the I and A tip. Posterior capsule was the polished. The anterior chamber was deepened with Provisc. A 22,5 Diopter Alcon SN60WF IOL was then inserted in the capsular bag. The Malugyn Ring was removed. Provisc was then removed with the I and A tip. The wound was then hydrated. Patient sent to the Recovery Room in good condition with follow up in my office.  Preoperative Diagnosis: Nuclear Cataract OS  Postoperative Diagnosis: Same  Procedure name: Kelman Phacoemulsification OS with IOL

## 2015-02-11 NOTE — Anesthesia Preprocedure Evaluation (Signed)
Anesthesia Evaluation  Patient identified by MRN, date of birth, ID band Patient awake    Reviewed: Allergy & Precautions, NPO status , Patient's Chart, lab work & pertinent test results  Airway Mallampati: II  TM Distance: >3 FB     Dental  (+) Teeth Intact   Pulmonary neg pulmonary ROS,  breath sounds clear to auscultation        Cardiovascular negative cardio ROS  Rhythm:Regular Rate:Normal     Neuro/Psych    GI/Hepatic   Endo/Other  Hypothyroidism   Renal/GU      Musculoskeletal  (+) Arthritis -,   Abdominal   Peds  Hematology   Anesthesia Other Findings Meniere's disease  Reproductive/Obstetrics                             Anesthesia Physical Anesthesia Plan  ASA: II  Anesthesia Plan:    Post-op Pain Management:    Induction:   Airway Management Planned:   Additional Equipment:   Intra-op Plan:   Post-operative Plan:   Informed Consent:   Plan Discussed with:   Anesthesia Plan Comments:         Anesthesia Quick Evaluation

## 2015-02-11 NOTE — H&P (Signed)
The patient was re examined and there is no change in the patients condition since the original H and P. 

## 2015-02-11 NOTE — Anesthesia Postprocedure Evaluation (Signed)
  Anesthesia Post-op Note  Patient: Gloria Mccoy  Procedure(s) Performed: Procedure(s) with comments: CATARACT EXTRACTION PHACO AND INTRAOCULAR LENS PLACEMENT (IOC) (Left) - CDE: 7.38  Patient Location: Short Stay  Anesthesia Type:MAC  Level of Consciousness: awake, alert  and oriented  Airway and Oxygen Therapy: Patient Spontanous Breathing  Post-op Pain: none  Post-op Assessment: Post-op Vital signs reviewed, Patient's Cardiovascular Status Stable, Respiratory Function Stable, Patent Airway and No signs of Nausea or vomiting              Post-op Vital Signs: Reviewed and stable  Last Vitals:  Filed Vitals:   02/11/15 0930  BP: 147/64  Temp:   Resp: 24    Complications: No apparent anesthesia complications

## 2015-02-11 NOTE — Transfer of Care (Signed)
Immediate Anesthesia Transfer of Care Note  Patient: Gloria Mccoy  Procedure(s) Performed: Procedure(s) with comments: CATARACT EXTRACTION PHACO AND INTRAOCULAR LENS PLACEMENT (IOC) (Left) - CDE: 7.38  Patient Location: Short Stay  Anesthesia Type:MAC  Level of Consciousness: awake  Airway & Oxygen Therapy: Patient Spontanous Breathing  Post-op Assessment: Report given to RN  Post vital signs: Reviewed  Last Vitals:  Filed Vitals:   02/11/15 0930  BP: 147/64  Temp:   Resp: 24    Complications: No apparent anesthesia complications

## 2015-02-11 NOTE — Discharge Instructions (Signed)
Cataract Surgery °Care After °Refer to this sheet in the next few weeks. These instructions provide you with information on caring for yourself after your procedure. Your caregiver may also give you more specific instructions. Your treatment has been planned according to current medical practices, but problems sometimes occur. Call your caregiver if you have any problems or questions after your procedure.  °HOME CARE INSTRUCTIONS  °· Avoid strenuous activities as directed by your caregiver. °· Ask your caregiver when you can resume driving. °· Use eyedrops or other medicines to help healing and control pressure inside your eye as directed by your caregiver. °· Only take over-the-counter or prescription medicines for pain, discomfort, or fever as directed by your caregiver. °· Do not to touch or rub your eyes. °· You may be instructed to use a protective shield during the first few days and nights after surgery. If not, wear sunglasses to protect your eyes. This is to protect the eye from pressure or from being accidentally bumped. °· Keep the area around your eye clean and dry. Avoid swimming or allowing water to hit you directly in the face while showering. Keep soap and shampoo out of your eyes. °· Do not bend or lift heavy objects. Bending increases pressure in the eye. You can walk, climb stairs, and do light household chores. °· Do not put a contact lens into the eye that had surgery until your caregiver says it is okay to do so. °· Ask your doctor when you can return to work. This will depend on the kind of work that you do. If you work in a dusty environment, you may be advised to wear protective eyewear for a period of time. °· Ask your caregiver when it will be safe to engage in sexual activity. °· Continue with your regular eye exams as directed by your caregiver. °What to expect: °· It is normal to feel itching and mild discomfort for a few days after cataract surgery. Some fluid discharge is also common,  and your eye may be sensitive to light and touch. °· After 1 to 2 days, even moderate discomfort should disappear. In most cases, healing will take about 6 weeks. °· If you received an intraocular lens (IOL), you may notice that colors are very bright or have a blue tinge. Also, if you have been in bright sunlight, everything may appear reddish for a few hours. If you see these color tinges, it is because your lens is clear and no longer cloudy. Within a few months after receiving an IOL, these extra colors should go away. When you have healed, you will probably need new glasses. °SEEK MEDICAL CARE IF:  °· You have increased bruising around your eye. °· You have discomfort not helped by medicine. °SEEK IMMEDIATE MEDICAL CARE IF:  °· You have a  fever. °· You have a worsening or sudden vision loss. °· You have redness, swelling, or increasing pain in the eye. °· You have a thick discharge from the eye that had surgery. °MAKE SURE YOU: °· Understand these instructions. °· Will watch your condition. °· Will get help right away if you are not doing well or get worse. °Document Released: 12/25/2004 Document Revised: 08/30/2011 Document Reviewed: 01/29/2011 °Exit[employer]® Patient Information ©2015 [employer]. This information is not intended to replace advice given to you by your health care provider. Make sure you discuss any questions you have with your health care provider. ° °

## 2015-02-12 ENCOUNTER — Encounter (HOSPITAL_COMMUNITY): Payer: Self-pay | Admitting: Ophthalmology

## 2015-05-01 DIAGNOSIS — M5412 Radiculopathy, cervical region: Secondary | ICD-10-CM | POA: Diagnosis not present

## 2015-05-01 DIAGNOSIS — Z6832 Body mass index (BMI) 32.0-32.9, adult: Secondary | ICD-10-CM | POA: Diagnosis not present

## 2015-05-01 DIAGNOSIS — E6609 Other obesity due to excess calories: Secondary | ICD-10-CM | POA: Diagnosis not present

## 2015-05-01 DIAGNOSIS — J019 Acute sinusitis, unspecified: Secondary | ICD-10-CM | POA: Diagnosis not present

## 2015-05-22 ENCOUNTER — Other Ambulatory Visit (HOSPITAL_COMMUNITY): Payer: Self-pay | Admitting: Internal Medicine

## 2015-05-22 DIAGNOSIS — Z1231 Encounter for screening mammogram for malignant neoplasm of breast: Secondary | ICD-10-CM

## 2015-06-20 DIAGNOSIS — R202 Paresthesia of skin: Secondary | ICD-10-CM | POA: Diagnosis not present

## 2015-06-20 DIAGNOSIS — Z0001 Encounter for general adult medical examination with abnormal findings: Secondary | ICD-10-CM | POA: Diagnosis not present

## 2015-06-20 DIAGNOSIS — E6609 Other obesity due to excess calories: Secondary | ICD-10-CM | POA: Diagnosis not present

## 2015-06-20 DIAGNOSIS — E669 Obesity, unspecified: Secondary | ICD-10-CM | POA: Diagnosis not present

## 2015-06-20 DIAGNOSIS — Z1389 Encounter for screening for other disorder: Secondary | ICD-10-CM | POA: Diagnosis not present

## 2015-06-20 DIAGNOSIS — Z6833 Body mass index (BMI) 33.0-33.9, adult: Secondary | ICD-10-CM | POA: Diagnosis not present

## 2015-06-20 DIAGNOSIS — M47812 Spondylosis without myelopathy or radiculopathy, cervical region: Secondary | ICD-10-CM | POA: Diagnosis not present

## 2015-06-20 DIAGNOSIS — I10 Essential (primary) hypertension: Secondary | ICD-10-CM | POA: Diagnosis not present

## 2015-06-20 DIAGNOSIS — E782 Mixed hyperlipidemia: Secondary | ICD-10-CM | POA: Diagnosis not present

## 2015-06-25 ENCOUNTER — Ambulatory Visit (HOSPITAL_COMMUNITY)
Admission: RE | Admit: 2015-06-25 | Discharge: 2015-06-25 | Disposition: A | Discharge status: Home / Self Care | Payer: Commercial Managed Care - HMO | Source: Ambulatory Visit | Attending: Internal Medicine | Admitting: Internal Medicine

## 2015-06-25 DIAGNOSIS — Z1231 Encounter for screening mammogram for malignant neoplasm of breast: Secondary | ICD-10-CM | POA: Diagnosis not present

## 2015-08-25 ENCOUNTER — Ambulatory Visit (HOSPITAL_COMMUNITY)
Admission: RE | Admit: 2015-08-25 | Discharge: 2015-08-25 | Disposition: A | Discharge status: Home / Self Care | Payer: Commercial Managed Care - HMO | Source: Ambulatory Visit | Attending: Family Medicine | Admitting: Family Medicine

## 2015-08-25 ENCOUNTER — Other Ambulatory Visit (HOSPITAL_COMMUNITY): Payer: Self-pay | Admitting: Family Medicine

## 2015-08-25 DIAGNOSIS — J069 Acute upper respiratory infection, unspecified: Secondary | ICD-10-CM | POA: Insufficient documentation

## 2015-08-25 DIAGNOSIS — Z6833 Body mass index (BMI) 33.0-33.9, adult: Secondary | ICD-10-CM | POA: Insufficient documentation

## 2015-08-25 DIAGNOSIS — Z1389 Encounter for screening for other disorder: Secondary | ICD-10-CM | POA: Diagnosis not present

## 2015-08-25 DIAGNOSIS — E6609 Other obesity due to excess calories: Secondary | ICD-10-CM | POA: Diagnosis not present

## 2015-08-25 DIAGNOSIS — J209 Acute bronchitis, unspecified: Secondary | ICD-10-CM | POA: Diagnosis not present

## 2015-08-25 DIAGNOSIS — R05 Cough: Secondary | ICD-10-CM | POA: Diagnosis not present

## 2015-09-02 DIAGNOSIS — Z961 Presence of intraocular lens: Secondary | ICD-10-CM | POA: Diagnosis not present

## 2015-12-12 ENCOUNTER — Other Ambulatory Visit (HOSPITAL_COMMUNITY): Payer: Self-pay | Admitting: Family Medicine

## 2015-12-12 ENCOUNTER — Ambulatory Visit (HOSPITAL_COMMUNITY)
Admission: RE | Admit: 2015-12-12 | Discharge: 2015-12-12 | Disposition: A | Discharge status: Home / Self Care | Payer: Commercial Managed Care - HMO | Source: Ambulatory Visit | Attending: Family Medicine | Admitting: Family Medicine

## 2015-12-12 DIAGNOSIS — E6609 Other obesity due to excess calories: Secondary | ICD-10-CM | POA: Diagnosis not present

## 2015-12-12 DIAGNOSIS — M7989 Other specified soft tissue disorders: Secondary | ICD-10-CM

## 2015-12-12 DIAGNOSIS — Z6834 Body mass index (BMI) 34.0-34.9, adult: Secondary | ICD-10-CM | POA: Diagnosis not present

## 2015-12-12 DIAGNOSIS — Z1389 Encounter for screening for other disorder: Secondary | ICD-10-CM | POA: Diagnosis not present

## 2016-02-06 DIAGNOSIS — Z1389 Encounter for screening for other disorder: Secondary | ICD-10-CM | POA: Diagnosis not present

## 2016-02-06 DIAGNOSIS — I10 Essential (primary) hypertension: Secondary | ICD-10-CM | POA: Diagnosis not present

## 2016-02-06 DIAGNOSIS — L508 Other urticaria: Secondary | ICD-10-CM | POA: Diagnosis not present

## 2016-02-06 DIAGNOSIS — Z6833 Body mass index (BMI) 33.0-33.9, adult: Secondary | ICD-10-CM | POA: Diagnosis not present

## 2016-02-06 DIAGNOSIS — E063 Autoimmune thyroiditis: Secondary | ICD-10-CM | POA: Diagnosis not present

## 2016-02-06 DIAGNOSIS — E782 Mixed hyperlipidemia: Secondary | ICD-10-CM | POA: Diagnosis not present

## 2016-06-24 ENCOUNTER — Other Ambulatory Visit: Payer: Self-pay | Admitting: Women's Health

## 2016-06-24 DIAGNOSIS — Z1231 Encounter for screening mammogram for malignant neoplasm of breast: Secondary | ICD-10-CM

## 2016-06-28 DIAGNOSIS — E063 Autoimmune thyroiditis: Secondary | ICD-10-CM | POA: Diagnosis not present

## 2016-06-28 DIAGNOSIS — Z0000 Encounter for general adult medical examination without abnormal findings: Secondary | ICD-10-CM | POA: Diagnosis not present

## 2016-06-28 DIAGNOSIS — Z23 Encounter for immunization: Secondary | ICD-10-CM | POA: Diagnosis not present

## 2016-06-28 DIAGNOSIS — I10 Essential (primary) hypertension: Secondary | ICD-10-CM | POA: Diagnosis not present

## 2016-06-28 DIAGNOSIS — Z1389 Encounter for screening for other disorder: Secondary | ICD-10-CM | POA: Diagnosis not present

## 2016-06-28 DIAGNOSIS — E782 Mixed hyperlipidemia: Secondary | ICD-10-CM | POA: Diagnosis not present

## 2016-06-28 DIAGNOSIS — Z6833 Body mass index (BMI) 33.0-33.9, adult: Secondary | ICD-10-CM | POA: Diagnosis not present

## 2016-06-28 DIAGNOSIS — E6609 Other obesity due to excess calories: Secondary | ICD-10-CM | POA: Diagnosis not present

## 2016-07-01 ENCOUNTER — Ambulatory Visit (HOSPITAL_COMMUNITY)
Admission: RE | Admit: 2016-07-01 | Discharge: 2016-07-01 | Disposition: A | Discharge status: Home / Self Care | Payer: Commercial Managed Care - HMO | Source: Ambulatory Visit | Attending: Women's Health | Admitting: Women's Health

## 2016-07-01 DIAGNOSIS — Z1231 Encounter for screening mammogram for malignant neoplasm of breast: Secondary | ICD-10-CM | POA: Insufficient documentation

## 2016-08-20 DIAGNOSIS — Z1389 Encounter for screening for other disorder: Secondary | ICD-10-CM | POA: Diagnosis not present

## 2016-08-20 DIAGNOSIS — L282 Other prurigo: Secondary | ICD-10-CM | POA: Diagnosis not present

## 2016-08-20 DIAGNOSIS — E6609 Other obesity due to excess calories: Secondary | ICD-10-CM | POA: Diagnosis not present

## 2016-08-20 DIAGNOSIS — Z6833 Body mass index (BMI) 33.0-33.9, adult: Secondary | ICD-10-CM | POA: Diagnosis not present

## 2016-09-07 DIAGNOSIS — H524 Presbyopia: Secondary | ICD-10-CM | POA: Diagnosis not present

## 2016-12-15 DIAGNOSIS — J302 Other seasonal allergic rhinitis: Secondary | ICD-10-CM | POA: Diagnosis not present

## 2016-12-15 DIAGNOSIS — Z6833 Body mass index (BMI) 33.0-33.9, adult: Secondary | ICD-10-CM | POA: Diagnosis not present

## 2016-12-15 DIAGNOSIS — Z1389 Encounter for screening for other disorder: Secondary | ICD-10-CM | POA: Diagnosis not present

## 2016-12-15 DIAGNOSIS — L299 Pruritus, unspecified: Secondary | ICD-10-CM | POA: Diagnosis not present

## 2016-12-15 DIAGNOSIS — R05 Cough: Secondary | ICD-10-CM | POA: Diagnosis not present

## 2017-03-30 DIAGNOSIS — Z23 Encounter for immunization: Secondary | ICD-10-CM | POA: Diagnosis not present

## 2017-06-01 ENCOUNTER — Other Ambulatory Visit: Payer: Self-pay | Admitting: Women's Health

## 2017-06-01 DIAGNOSIS — Z1231 Encounter for screening mammogram for malignant neoplasm of breast: Secondary | ICD-10-CM

## 2017-06-21 DIAGNOSIS — I219 Acute myocardial infarction, unspecified: Secondary | ICD-10-CM

## 2017-06-21 HISTORY — DX: Acute myocardial infarction, unspecified: I21.9

## 2017-07-06 ENCOUNTER — Ambulatory Visit (HOSPITAL_COMMUNITY)
Admission: RE | Admit: 2017-07-06 | Discharge: 2017-07-06 | Disposition: A | Discharge status: Home / Self Care | Payer: Commercial Managed Care - HMO | Source: Ambulatory Visit | Attending: Women's Health | Admitting: Women's Health

## 2017-07-06 DIAGNOSIS — Z1231 Encounter for screening mammogram for malignant neoplasm of breast: Secondary | ICD-10-CM | POA: Diagnosis not present

## 2017-07-13 ENCOUNTER — Encounter (HOSPITAL_COMMUNITY): Payer: Self-pay | Admitting: Emergency Medicine

## 2017-07-13 ENCOUNTER — Emergency Department (HOSPITAL_COMMUNITY): Payer: Medicare HMO

## 2017-07-13 ENCOUNTER — Inpatient Hospital Stay (HOSPITAL_COMMUNITY)
Admission: EM | Admit: 2017-07-13 | Discharge: 2017-07-20 | DRG: 234 | Disposition: A | Discharge status: Home Health | Payer: Medicare HMO | Attending: Thoracic Surgery (Cardiothoracic Vascular Surgery) | Admitting: Thoracic Surgery (Cardiothoracic Vascular Surgery)

## 2017-07-13 ENCOUNTER — Other Ambulatory Visit: Payer: Self-pay | Admitting: *Deleted

## 2017-07-13 ENCOUNTER — Inpatient Hospital Stay (HOSPITAL_COMMUNITY)
Admission: EM | Disposition: A | Discharge status: Home Health | Payer: Self-pay | Source: Home / Self Care | Attending: Thoracic Surgery (Cardiothoracic Vascular Surgery)

## 2017-07-13 ENCOUNTER — Other Ambulatory Visit: Payer: Self-pay

## 2017-07-13 DIAGNOSIS — Z01810 Encounter for preprocedural cardiovascular examination: Secondary | ICD-10-CM | POA: Diagnosis not present

## 2017-07-13 DIAGNOSIS — F17290 Nicotine dependence, other tobacco product, uncomplicated: Secondary | ICD-10-CM | POA: Diagnosis present

## 2017-07-13 DIAGNOSIS — Z6835 Body mass index (BMI) 35.0-35.9, adult: Secondary | ICD-10-CM | POA: Diagnosis not present

## 2017-07-13 DIAGNOSIS — I2510 Atherosclerotic heart disease of native coronary artery without angina pectoris: Secondary | ICD-10-CM

## 2017-07-13 DIAGNOSIS — I25110 Atherosclerotic heart disease of native coronary artery with unstable angina pectoris: Principal | ICD-10-CM | POA: Diagnosis present

## 2017-07-13 DIAGNOSIS — R739 Hyperglycemia, unspecified: Secondary | ICD-10-CM

## 2017-07-13 DIAGNOSIS — D696 Thrombocytopenia, unspecified: Secondary | ICD-10-CM | POA: Diagnosis not present

## 2017-07-13 DIAGNOSIS — R0602 Shortness of breath: Secondary | ICD-10-CM | POA: Diagnosis not present

## 2017-07-13 DIAGNOSIS — E8770 Fluid overload, unspecified: Secondary | ICD-10-CM | POA: Diagnosis not present

## 2017-07-13 DIAGNOSIS — E669 Obesity, unspecified: Secondary | ICD-10-CM | POA: Diagnosis present

## 2017-07-13 DIAGNOSIS — Z79890 Hormone replacement therapy (postmenopausal): Secondary | ICD-10-CM

## 2017-07-13 DIAGNOSIS — Z7982 Long term (current) use of aspirin: Secondary | ICD-10-CM | POA: Diagnosis not present

## 2017-07-13 DIAGNOSIS — E039 Hypothyroidism, unspecified: Secondary | ICD-10-CM | POA: Diagnosis present

## 2017-07-13 DIAGNOSIS — J90 Pleural effusion, not elsewhere classified: Secondary | ICD-10-CM | POA: Diagnosis not present

## 2017-07-13 DIAGNOSIS — M1990 Unspecified osteoarthritis, unspecified site: Secondary | ICD-10-CM | POA: Diagnosis present

## 2017-07-13 DIAGNOSIS — Z79899 Other long term (current) drug therapy: Secondary | ICD-10-CM | POA: Diagnosis not present

## 2017-07-13 DIAGNOSIS — R7303 Prediabetes: Secondary | ICD-10-CM

## 2017-07-13 DIAGNOSIS — R0789 Other chest pain: Secondary | ICD-10-CM | POA: Diagnosis present

## 2017-07-13 DIAGNOSIS — Z8249 Family history of ischemic heart disease and other diseases of the circulatory system: Secondary | ICD-10-CM

## 2017-07-13 DIAGNOSIS — H8109 Meniere's disease, unspecified ear: Secondary | ICD-10-CM | POA: Diagnosis present

## 2017-07-13 DIAGNOSIS — E1165 Type 2 diabetes mellitus with hyperglycemia: Secondary | ICD-10-CM | POA: Diagnosis present

## 2017-07-13 DIAGNOSIS — M8580 Other specified disorders of bone density and structure, unspecified site: Secondary | ICD-10-CM | POA: Diagnosis present

## 2017-07-13 DIAGNOSIS — R079 Chest pain, unspecified: Secondary | ICD-10-CM

## 2017-07-13 DIAGNOSIS — K5900 Constipation, unspecified: Secondary | ICD-10-CM | POA: Diagnosis not present

## 2017-07-13 DIAGNOSIS — E785 Hyperlipidemia, unspecified: Secondary | ICD-10-CM | POA: Diagnosis present

## 2017-07-13 DIAGNOSIS — D62 Acute posthemorrhagic anemia: Secondary | ICD-10-CM | POA: Diagnosis not present

## 2017-07-13 DIAGNOSIS — J9811 Atelectasis: Secondary | ICD-10-CM | POA: Diagnosis not present

## 2017-07-13 DIAGNOSIS — I25810 Atherosclerosis of coronary artery bypass graft(s) without angina pectoris: Secondary | ICD-10-CM | POA: Diagnosis not present

## 2017-07-13 DIAGNOSIS — Z882 Allergy status to sulfonamides status: Secondary | ICD-10-CM | POA: Diagnosis not present

## 2017-07-13 DIAGNOSIS — I371 Nonrheumatic pulmonary valve insufficiency: Secondary | ICD-10-CM | POA: Diagnosis not present

## 2017-07-13 DIAGNOSIS — I10 Essential (primary) hypertension: Secondary | ICD-10-CM | POA: Diagnosis present

## 2017-07-13 DIAGNOSIS — N179 Acute kidney failure, unspecified: Secondary | ICD-10-CM | POA: Diagnosis present

## 2017-07-13 DIAGNOSIS — I083 Combined rheumatic disorders of mitral, aortic and tricuspid valves: Secondary | ICD-10-CM | POA: Diagnosis not present

## 2017-07-13 DIAGNOSIS — Z951 Presence of aortocoronary bypass graft: Secondary | ICD-10-CM

## 2017-07-13 DIAGNOSIS — R269 Unspecified abnormalities of gait and mobility: Secondary | ICD-10-CM | POA: Diagnosis not present

## 2017-07-13 DIAGNOSIS — I200 Unstable angina: Secondary | ICD-10-CM | POA: Diagnosis not present

## 2017-07-13 DIAGNOSIS — Z833 Family history of diabetes mellitus: Secondary | ICD-10-CM

## 2017-07-13 HISTORY — PX: LEFT HEART CATH AND CORONARY ANGIOGRAPHY: CATH118249

## 2017-07-13 HISTORY — DX: Obesity, unspecified: E66.9

## 2017-07-13 HISTORY — DX: Atherosclerotic heart disease of native coronary artery without angina pectoris: I25.10

## 2017-07-13 HISTORY — DX: Essential (primary) hypertension: I10

## 2017-07-13 HISTORY — PX: CARDIAC CATHETERIZATION: SHX172

## 2017-07-13 LAB — CBC WITH DIFFERENTIAL/PLATELET
Basophils Absolute: 0 10*3/uL (ref 0.0–0.1)
Basophils Relative: 0 %
EOS ABS: 0.3 10*3/uL (ref 0.0–0.7)
Eosinophils Relative: 3 %
HCT: 39.2 % (ref 36.0–46.0)
HEMOGLOBIN: 13.5 g/dL (ref 12.0–15.0)
Lymphocytes Relative: 31 %
Lymphs Abs: 3 10*3/uL (ref 0.7–4.0)
MCH: 29.7 pg (ref 26.0–34.0)
MCHC: 34.4 g/dL (ref 30.0–36.0)
MCV: 86.3 fL (ref 78.0–100.0)
MONOS PCT: 9 %
Monocytes Absolute: 0.9 10*3/uL (ref 0.1–1.0)
NEUTROS ABS: 5.3 10*3/uL (ref 1.7–7.7)
NEUTROS PCT: 57 %
Platelets: 271 10*3/uL (ref 150–400)
RBC: 4.54 MIL/uL (ref 3.87–5.11)
RDW: 13.7 % (ref 11.5–15.5)
WBC: 9.5 10*3/uL (ref 4.0–10.5)

## 2017-07-13 LAB — HEPATIC FUNCTION PANEL
ALBUMIN: 3.7 g/dL (ref 3.5–5.0)
ALK PHOS: 57 U/L (ref 38–126)
ALT: 14 U/L (ref 14–54)
AST: 18 U/L (ref 15–41)
BILIRUBIN INDIRECT: 0.7 mg/dL (ref 0.3–0.9)
Bilirubin, Direct: 0.1 mg/dL (ref 0.1–0.5)
TOTAL PROTEIN: 7.4 g/dL (ref 6.5–8.1)
Total Bilirubin: 0.8 mg/dL (ref 0.3–1.2)

## 2017-07-13 LAB — BASIC METABOLIC PANEL
Anion gap: 10 (ref 5–15)
BUN: 8 mg/dL (ref 6–20)
CHLORIDE: 104 mmol/L (ref 101–111)
CO2: 27 mmol/L (ref 22–32)
CREATININE: 0.83 mg/dL (ref 0.44–1.00)
Calcium: 9 mg/dL (ref 8.9–10.3)
GFR calc non Af Amer: >60 mL/min (ref >60)
Glucose, Bld: 109 mg/dL — ABNORMAL HIGH (ref 65–99)
Potassium: 4.1 mmol/L (ref 3.5–5.1)
Sodium: 141 mmol/L (ref 135–145)

## 2017-07-13 LAB — TROPONIN I: Troponin I: 0.07 ng/mL (ref <0.03)

## 2017-07-13 LAB — MAGNESIUM: Magnesium: 1.9 mg/dL (ref 1.7–2.4)

## 2017-07-13 LAB — PROTIME-INR
INR: 0.97
PROTHROMBIN TIME: 12.8 s (ref 11.4–15.2)

## 2017-07-13 LAB — BRAIN NATRIURETIC PEPTIDE: B Natriuretic Peptide: 55 pg/mL (ref 0.0–100.0)

## 2017-07-13 LAB — TSH: TSH: 3.78 u[IU]/mL (ref 0.350–4.500)

## 2017-07-13 SURGERY — LEFT HEART CATH AND CORONARY ANGIOGRAPHY
Anesthesia: LOCAL

## 2017-07-13 MED ORDER — HEPARIN (PORCINE) IN NACL 2-0.9 UNIT/ML-% IJ SOLN
INTRAMUSCULAR | Status: AC
  Filled 2017-07-13: qty 1000

## 2017-07-13 MED ORDER — SODIUM CHLORIDE 0.9 % IV SOLN
INTRAVENOUS | Status: AC | PRN
  Administered 2017-07-13: 1 mL/kg/h via INTRAVENOUS

## 2017-07-13 MED ORDER — LIDOCAINE HCL (PF) 1 % IJ SOLN
INTRAMUSCULAR | Status: DC | PRN
  Administered 2017-07-13: 2 mL

## 2017-07-13 MED ORDER — MIDAZOLAM HCL 2 MG/2ML IJ SOLN
INTRAMUSCULAR | Status: AC
  Filled 2017-07-13: qty 2

## 2017-07-13 MED ORDER — LEVOTHYROXINE SODIUM 75 MCG PO TABS
75.0000 ug | ORAL_TABLET | Freq: Every day | ORAL | Status: DC
  Administered 2017-07-14 – 2017-07-20 (×6): 75 ug via ORAL
  Filled 2017-07-13 (×6): qty 1

## 2017-07-13 MED ORDER — NITROGLYCERIN 1 MG/10 ML FOR IR/CATH LAB
INTRA_ARTERIAL | Status: AC
  Filled 2017-07-13: qty 10

## 2017-07-13 MED ORDER — ATORVASTATIN CALCIUM 80 MG PO TABS
80.0000 mg | ORAL_TABLET | ORAL | Status: AC
  Filled 2017-07-13: qty 1

## 2017-07-13 MED ORDER — MIDAZOLAM HCL 2 MG/2ML IJ SOLN
INTRAMUSCULAR | Status: DC | PRN
  Administered 2017-07-13: 1 mg via INTRAVENOUS

## 2017-07-13 MED ORDER — SODIUM CHLORIDE 0.9% FLUSH: 3.0000 mL | INTRAVENOUS | Status: DC | PRN

## 2017-07-13 MED ORDER — FENTANYL CITRATE (PF) 100 MCG/2ML IJ SOLN
INTRAMUSCULAR | Status: AC
  Filled 2017-07-13: qty 2

## 2017-07-13 MED ORDER — IOPAMIDOL (ISOVUE-370) INJECTION 76%
INTRAVENOUS | Status: DC | PRN
  Administered 2017-07-13: 90 mL via INTRA_ARTERIAL

## 2017-07-13 MED ORDER — SODIUM CHLORIDE 0.9% FLUSH: 3.0000 mL | Freq: Two times a day (BID) | INTRAVENOUS | Status: DC

## 2017-07-13 MED ORDER — SODIUM CHLORIDE 0.9 % IV SOLN: 250.0000 mL | INTRAVENOUS | Status: DC | PRN

## 2017-07-13 MED ORDER — OMEGA-3 FATTY ACIDS 1000 MG PO CAPS: 1.0000 g | ORAL_CAPSULE | Freq: Every day | ORAL | Status: DC

## 2017-07-13 MED ORDER — OMEGA-3-ACID ETHYL ESTERS 1 G PO CAPS
1.0000 g | ORAL_CAPSULE | Freq: Every day | ORAL | Status: DC
  Administered 2017-07-13 – 2017-07-19 (×6): 1 g via ORAL
  Filled 2017-07-13 (×9): qty 1

## 2017-07-13 MED ORDER — ACETAMINOPHEN 325 MG PO TABS: 650.0000 mg | ORAL_TABLET | ORAL | Status: DC | PRN

## 2017-07-13 MED ORDER — HEPARIN BOLUS VIA INFUSION
4000.0000 [IU] | Freq: Once | INTRAVENOUS | Status: AC
  Administered 2017-07-13: 4000 [IU] via INTRAVENOUS

## 2017-07-13 MED ORDER — ATORVASTATIN CALCIUM 80 MG PO TABS
80.0000 mg | ORAL_TABLET | Freq: Every evening | ORAL | Status: DC
  Administered 2017-07-14: 80 mg via ORAL
  Filled 2017-07-13 (×2): qty 1

## 2017-07-13 MED ORDER — VERAPAMIL HCL 2.5 MG/ML IV SOLN
INTRAVENOUS | Status: DC | PRN
  Administered 2017-07-13: 10 mL via INTRA_ARTERIAL

## 2017-07-13 MED ORDER — HEPARIN SODIUM (PORCINE) 1000 UNIT/ML IJ SOLN
INTRAMUSCULAR | Status: DC | PRN
  Administered 2017-07-13: 5000 [IU] via INTRAVENOUS

## 2017-07-13 MED ORDER — ASPIRIN EC 81 MG PO TBEC
81.0000 mg | DELAYED_RELEASE_TABLET | Freq: Every day | ORAL | Status: DC
  Administered 2017-07-14: 81 mg via ORAL
  Filled 2017-07-13: qty 1

## 2017-07-13 MED ORDER — HEPARIN (PORCINE) IN NACL 100-0.45 UNIT/ML-% IJ SOLN
900.0000 [IU]/h | INTRAMUSCULAR | Status: DC
  Administered 2017-07-13: 900 [IU]/h via INTRAVENOUS
  Filled 2017-07-13: qty 250

## 2017-07-13 MED ORDER — NITROGLYCERIN 0.4 MG SL SUBL: 0.4000 mg | SUBLINGUAL_TABLET | SUBLINGUAL | Status: DC | PRN

## 2017-07-13 MED ORDER — VERAPAMIL HCL 2.5 MG/ML IV SOLN
INTRAVENOUS | Status: AC
  Filled 2017-07-13: qty 2

## 2017-07-13 MED ORDER — HEPARIN (PORCINE) IN NACL 2-0.9 UNIT/ML-% IJ SOLN
INTRAMUSCULAR | Status: AC | PRN
  Administered 2017-07-13: 1000 mL

## 2017-07-13 MED ORDER — HEPARIN (PORCINE) IN NACL 100-0.45 UNIT/ML-% IJ SOLN
1100.0000 [IU]/h | INTRAMUSCULAR | Status: DC
  Administered 2017-07-13: 1000 [IU]/h via INTRAVENOUS
  Administered 2017-07-14: 1100 [IU]/h via INTRAVENOUS
  Filled 2017-07-13 (×2): qty 250

## 2017-07-13 MED ORDER — SODIUM CHLORIDE 0.9 % WEIGHT BASED INFUSION: 1.0000 mL/kg/h | INTRAVENOUS | Status: AC

## 2017-07-13 MED ORDER — IOPAMIDOL (ISOVUE-370) INJECTION 76%
INTRAVENOUS | Status: AC
  Filled 2017-07-13: qty 125

## 2017-07-13 MED ORDER — FENTANYL CITRATE (PF) 100 MCG/2ML IJ SOLN
INTRAMUSCULAR | Status: DC | PRN
  Administered 2017-07-13: 25 ug via INTRAVENOUS

## 2017-07-13 MED ORDER — LIDOCAINE HCL (PF) 1 % IJ SOLN
INTRAMUSCULAR | Status: AC
  Filled 2017-07-13: qty 30

## 2017-07-13 MED ORDER — HEPARIN SODIUM (PORCINE) 1000 UNIT/ML IJ SOLN
INTRAMUSCULAR | Status: AC
  Filled 2017-07-13: qty 1

## 2017-07-13 MED ORDER — SODIUM CHLORIDE 0.9 % IV SOLN: INTRAVENOUS | Status: DC

## 2017-07-13 MED ORDER — ONDANSETRON HCL 4 MG/2ML IJ SOLN: 4.0000 mg | Freq: Four times a day (QID) | INTRAMUSCULAR | Status: DC | PRN

## 2017-07-13 MED ORDER — ASPIRIN 81 MG PO CHEW
324.0000 mg | CHEWABLE_TABLET | Freq: Once | ORAL | Status: AC
  Administered 2017-07-13: 324 mg via ORAL
  Filled 2017-07-13: qty 4

## 2017-07-13 MED ORDER — NITROGLYCERIN 2 % TD OINT
0.5000 [in_us] | TOPICAL_OINTMENT | Freq: Four times a day (QID) | TRANSDERMAL | Status: DC
  Administered 2017-07-13 – 2017-07-14 (×5): 0.5 [in_us] via TOPICAL
  Filled 2017-07-13 (×4): qty 30
  Filled 2017-07-13: qty 1
  Filled 2017-07-13: qty 30

## 2017-07-13 SURGICAL SUPPLY — 11 items
CATH 5FR JL3.5 JR4 ANG PIG MP (CATHETERS) ×2 IMPLANT
DEVICE RAD COMP TR BAND LRG (VASCULAR PRODUCTS) ×2 IMPLANT
GLIDESHEATH SLEND SS 6F .021 (SHEATH) ×2 IMPLANT
GUIDEWIRE INQWIRE 1.5J.035X260 (WIRE) ×1 IMPLANT
INQWIRE 1.5J .035X260CM (WIRE) ×2
KIT ENCORE 26 ADVANTAGE (KITS) ×2 IMPLANT
KIT HEART LEFT (KITS) ×2 IMPLANT
PACK CARDIAC CATHETERIZATION (CUSTOM PROCEDURE TRAY) ×2 IMPLANT
SYR MEDRAD MARK V 150ML (SYRINGE) ×2 IMPLANT
TRANSDUCER W/STOPCOCK (MISCELLANEOUS) ×2 IMPLANT
TUBING CIL FLEX 10 FLL-RA (TUBING) ×2 IMPLANT

## 2017-07-13 NOTE — Progress Notes (Signed)
ANTICOAGULATION CONSULT NOTE - Initial Consult  Pharmacy Consult for Heparin Indication: chest pain/ACS  Allergies  Allergen Reactions  . Sulfa Antibiotics     Difficulty breathing    Patient Measurements: Height: 5\' 3"  (160 cm) Weight: 190 lb (86.2 kg) IBW/kg (Calculated) : 52.4 HEPARIN DW (KG): 71.7  Vital Signs: Temp: 98.2 F (36.8 C) (01/23 0905) Temp Source: Oral (01/23 0905) BP: 158/76 (01/23 1100) Pulse Rate: 63 (01/23 1100)  Labs: Recent Labs    07/13/17 0908  HGB 13.5  HCT 39.2  PLT 271  CREATININE 0.83  TROPONINI 0.03*    Estimated Creatinine Clearance: 63.7 mL/min (by C-G formula based on SCr of 0.83 mg/dL).   Medical History: Past Medical History:  Diagnosis Date  . Arthritis   . Dips tobacco   . Essential hypertension   . Hypothyroid   . Meniere disease   . Obesity   . Osteopenia 01/2012   T score -1.3 FRAX 7.9%/0.6%    Medications:  See med rec  Assessment: 72 y.o. female with a hx of HTN, hypothyroidism, arthritis, obesity, dipping tobacco use, and family history of CAD who is being seen today for the evaluation of chest pain. Today she felt diaphoretic, nauseated, and near-syncopal with the discomfort. It resolves with rest. No bleeding noted.  Symptoms are concerning for ACS. Pharmacy asked to start Heparin.  Goal of Therapy:  Heparin level 0.3-0.7 units/ml Monitor platelets by anticoagulation protocol: Yes   Plan:  Give 4000 units bolus x 1 Start heparin infusion at 900 units/hr Check anti-Xa level in 6-8 hours and daily while on heparin Continue to monitor H&H and platelets  Isac Sarna, BS Vena [place], BCPS Clinical Pharmacist Pager [phone] 07/13/2017,11:34 AM

## 2017-07-13 NOTE — Progress Notes (Signed)
ANTICOAGULATION CONSULT NOTE - Initial Consult  Pharmacy Consult for Heparin Indication: CAD  Allergies  Allergen Reactions  . Sulfa Antibiotics     Difficulty breathing    Patient Measurements: Height: 5\' 3"  (160 cm) Weight: 190 lb (86.2 kg) IBW/kg (Calculated) : 52.4 Heparin Dosing Weight:  71.7 kg  Vital Signs: Temp: 98.2 F (36.8 C) (01/23 0905) Temp Source: Oral (01/23 0905) BP: 164/81 (01/23 1426) Pulse Rate: 0 (01/23 1435)  Labs: Recent Labs    07/13/17 0908 07/13/17 1128 07/13/17 1216  HGB 13.5  --   --   HCT 39.2  --   --   PLT 271  --   --   LABPROT  --  12.8  --   INR  --  0.97  --   CREATININE 0.83  --   --   TROPONINI 0.03*  --  0.07*    Estimated Creatinine Clearance: 63.7 mL/min (by C-G formula based on SCr of 0.83 mg/dL).   Medical History: Past Medical History:  Diagnosis Date  . Arthritis   . Dips tobacco   . Essential hypertension   . Hypothyroid   . Meniere disease   . Obesity   . Osteopenia 01/2012   T score -1.3 FRAX 7.9%/0.6%    Assessment: CC/HPI: chest pain. Today she felt diaphoretic, nauseated, and near-syncopal with the discomfort. It resolves with rest. No bleeding noted.  Symptoms are concerning for ACS  PMH: hx of HTN, hypothyroidism, arthritis, obesity, dipping tobacco use, and family history of CAD   Anticoag: Baseline CBC WNL. S/p cath. Radial sheath removed 1430.  CV: +troponins. Cath with single vessel obstructive CAD: complex ostial and proximal to mid LAD disease   Goal of Therapy:  Heparin level 0.3-0.7 units/ml Monitor platelets by anticoagulation protocol: Yes   Plan:  Start IV heparin at 2230 (8 hrs after sheath) at 1000 units/hr Daily HL and CBC   [name] S. Alford Highland, PharmD, BCPS Clinical Staff Pharmacist Pager [phone]  Gloria Mccoy 07/13/2017,3:06 PM

## 2017-07-13 NOTE — H&P (Addendum)
Cardiology Consultation:   Patient ID: Gloria Mccoy; [id number]; [birth year]   Admit date: 07/13/2017 Date of Consult: 07/13/2017  Primary Care Provider: Redmond School, MD Primary Cardiologist: New to Dr. Harl Bowie  Chief Complaint: chest pain  Patient Profile:   Gloria Mccoy is a 72 y.o. female with a hx of HTN, hypothyroidism, arthritis, obesity, dipping tobacco use, and family history of CAD who is being seen today for the evaluation of chest pain at the request of Dr. Thurnell Garbe.  History of Present Illness:   Gloria Mccoy has no prior cardiac hx (remote stress test OK per pt). Over the last 3 weeks she has noticed exertional chest tightness described as squeezing associated with dyspnea. Initially it was infrequently, with higher levels of exertion but over the last few days has gotten even more significant to the point where even ADLS or walking to the mailbox elicits the symptoms. Today she felt diaphoretic, nauseated, and near-syncopal with the discomfort. It resolves with rest. She's not had any pain at rest, but gets it now every time she tries to exert herself. No bleeding, history of stroke/TIA, recent illness, edema or any other new symptoms. She does have family history of CAD with father having MI in his 79s and brother with MI in his 55s. She is currently comfortable at rest. Pulse 60s, normal O2 sat, BP elevated to 158/76. Initial troponin 0.03, BNP wnl, glucose 109, CBC wnl.  Initial EKG difficult to interpret due to baseline wander, f/u tracing shows NSR 61bpm possible LVH, TWI III, avF, and nonspecific T wave change in V5. Aside from V5 this is generally similar to 2016.  Past Medical History:  Diagnosis Date  . Arthritis   . Dips tobacco   . Essential hypertension   . Hypothyroid   . Meniere disease   . Obesity   . Osteopenia 01/2012   T score -1.3 FRAX 7.9%/0.6%    Past Surgical History:  Procedure Laterality Date  . CATARACT EXTRACTION W/PHACO Right  01/28/2015   Procedure: CATARACT EXTRACTION PHACO AND INTRAOCULAR LENS PLACEMENT :  CDE:  5.70;  Surgeon: Rutherford Guys, MD;  Location: [facility];  Service: Ophthalmology;  Laterality: Right;  . CATARACT EXTRACTION W/PHACO Left 02/11/2015   Procedure: CATARACT EXTRACTION PHACO AND INTRAOCULAR LENS PLACEMENT (IOC);  Surgeon: Rutherford Guys, MD;  Location: [facility];  Service: Ophthalmology;  Laterality: Left;  CDE: 7.38  . COLONOSCOPY N/A 09/26/2012   Procedure: COLONOSCOPY;  Surgeon: Jamesetta So, MD;  Location: [facility];  Service: Gastroenterology;  Laterality: N/A;  . EAR SURGERY Left    fluid from ear drum  . TUBAL LIGATION       Inpatient Medications: Scheduled Meds: . nitroGLYCERIN  0.5 inch Topical Q6H   Continuous Infusions: . heparin 900 Units/hr (07/13/17 1150)   PRN Meds:   Home Meds: Prior to Admission medications   Medication Sig Start Date End Date Taking? Authorizing Provider  aspirin 81 MG tablet Take 81 mg by mouth daily.   Yes [provider]  Cholecalciferol (VITAMIN D PO) Take 1 tablet by mouth daily.    Yes [provider]  Cyanocobalamin (VITAMIN B-12 PO) Take 1 tablet by mouth daily.   Yes [provider]  fish oil-omega-3 fatty acids 1000 MG capsule Take 1 g by mouth daily.   Yes [provider]  levothyroxine (SYNTHROID, LEVOTHROID) 75 MCG tablet Take 75 mcg by mouth daily.   Yes [provider]  naproxen sodium (ANAPROX) 220  MG tablet Take 220 mg by mouth daily as needed (for pain).   Yes [provider]  VITAMIN E PO Take 1 tablet by mouth daily.    Yes [provider]    Allergies:    Allergies  Allergen Reactions  . Sulfa Antibiotics     Difficulty breathing    Social History:   Social History   Socioeconomic History  . Marital status: Married    Spouse name: Not on file  . Number of children: Not on file  . Years of education: Not on file  . Highest education level: Not on file    Social Needs  . Financial resource strain: Not on file  . Food insecurity - worry: Not on file  . Food insecurity - inability: Not on file  . Transportation needs - medical: Not on file  . Transportation needs - non-medical: Not on file  Occupational History  . Not on file  Tobacco Use  . Smoking status: Never Smoker  . Smokeless tobacco: Current User  Substance and Sexual Activity  . Alcohol use: Yes    Comment: occas. - once per week  . Drug use: No  . Sexual activity: Yes    Birth control/protection: Surgical  Other Topics Concern  . Not on file  Social History Narrative  . Not on file    Family History:   The patient's family history includes Breast cancer in her cousin; Cancer (age of onset: 34) in her maternal uncle; Diabetes in her sister and sister; Heart attack in her father; Heart disease (age of onset: 6) in her sister; Heart disease (age of onset: 45) in her brother; Hypertension in her daughter and daughter.  ROS:  Please see the history of present illness. All other ROS reviewed and negative.     Physical Exam/Data:   Vitals:   07/13/17 1023 07/13/17 1030 07/13/17 1100 07/13/17 1132  BP: (!) 144/68 (!) 156/72 (!) 158/76   Pulse: 63 63 63   Resp: 18  18   Temp:      TempSrc:      SpO2: 98% 98% 100%   Weight:   190 lb (86.2 kg)   Height:   5\' 3"  (1.6 m) 5\' 3"  (1.6 m)   No intake or output data in the 24 hours ending 07/13/17 1158 Filed Weights   07/13/17 1100  Weight: 190 lb (86.2 kg)   Body mass index is 33.66 kg/m.  General: Well developed, well nourished obese AAF in no acute distress. In good spirits, smiling with family Head: Normocephalic, atraumatic, sclera non-icteric, no xanthomas, nares are without discharge.  Neck: Negative for carotid bruits. JVD not elevated. Lungs: Clear bilaterally to auscultation without wheezes, rales, or rhonchi. Breathing is unlabored. Heart: RRR with S1 S2. No murmurs, rubs, or gallops appreciated. Abdomen: Soft,  non-tender, non-distended with normoactive bowel sounds. No hepatomegaly. No rebound/guarding. No obvious abdominal masses. Msk:  Strength and tone appear normal for age. Extremities: No clubbing or cyanosis. Trace BLE edema.  Distal pedal pulses are 2+ and equal bilaterally. Neuro: Alert and oriented X 3. No facial asymmetry. No focal deficit. Moves all extremities spontaneously. Psych:  Responds to questions appropriately with a normal affect.  EKG:  The EKG was personally reviewed and demonstrates  Initial EKG difficult to interpret due to baseline wander, f/u tracing shows NSR 61bpm possible LVH, TWI III, avF, and nonspecific T wave change in V5. Aside from V5 this is generally similar to 2016  Relevant  CV Studies: n/a  Laboratory Data:  Chemistry Recent Labs  Lab 07/13/17 0908  NA 141  K 4.1  CL 104  CO2 27  GLUCOSE 109*  BUN 8  CREATININE 0.83  CALCIUM 9.0  GFRNONAA >60  GFRAA >60  ANIONGAP 10    No results for input(s): PROT, ALBUMIN, AST, ALT, ALKPHOS, BILITOT in the last 168 hours. Hematology Recent Labs  Lab 07/13/17 0908  WBC 9.5  RBC 4.54  HGB 13.5  HCT 39.2  MCV 86.3  MCH 29.7  MCHC 34.4  RDW 13.7  PLT 271   Cardiac Enzymes Recent Labs  Lab 07/13/17 0908  TROPONINI 0.03*   No results for input(s): TROPIPOC in the last 168 hours.  BNP Recent Labs  Lab 07/13/17 0913  BNP 55.0    DDimer No results for input(s): DDIMER in the last 168 hours.  Radiology/Studies:  Dg Chest 2 View  Result Date: 07/13/2017 CLINICAL DATA:  Chest pain for several weeks with shortness of Breath EXAM: CHEST  2 VIEW COMPARISON:  08/25/2015 FINDINGS: Cardiac shadow remains at the upper limits of normal in size. The lungs are well aerated bilaterally. Mild atelectatic changes are noted in the bases. No focal infiltrate or sizable effusion is seen. No acute bony abnormality is noted. IMPRESSION: Mild bibasilar atelectasis. Electronically Signed   By: Inez Catalina M.D.   On:  07/13/2017 09:52    Assessment and Plan:   1. Chest pain/dyspnea concerning for unstable angina/possible NSTEMI - symptoms are concerning for ACS, progressive over the last 3 weeks. Initial troponin borderline - would not be surprised if subsequent value is elevated. EKG is nonacute and she is not having any pain at rest. Received 324mg  ASA in ED. Start heparin per pharmacy and add NTG paste. Recommend lipids in AM and add high dose statin empirically. I think she probably needs left heart cath. Risks and benefits of cardiac catheterization have been discussed with the patient.  These include bleeding, infection, kidney damage, stroke, heart attack, death.  The patient understands these risks and is willing to proceed if that is what is decided. Will review with Dr. Harl Bowie.  2. Ongoing dip tobacco - counseled on importance of cessation.  3. Hyperglycemia - recommend A1C with labs.  4. Essential HTN - pt reports she is on blood pressure medicine but nothing is listed on her med list today. Follow BP with NTG as above and will need to tailor BP management to workup this admission.  5. Hypothyroidism - continue home regimen and recommend TSH.   For questions or updates, please contact [facility] Please consult [email or web address] for contact info under Cardiology/STEMI.    Signed, Charlie Pitter, PA-C  07/13/2017 11:58 AM  Attending note Patient seen and discussed with PA Dunn, I agree with her documetnation. 72 yo female with CAD risk factors presents with chest pain very typical for unstable angina. Accelrating in frequency and severity. We will plan for cath today  for defintive evaluation and management, transfer to [facility] cone   I have reviewed the risks, indications, and alternatives to cardiac catheterization, possible angioplasty, and stenting with the patient today. Risks include but are not limited to bleeding, infection, vascular injury, stroke, myocardial infection, arrhythmia, kidney  injury, radiation-related injury in the case of prolonged fluoroscopy use, emergency cardiac surgery, and death. The patient understands the risks of serious complication is 1-2 in [id number] with diagnostic cardiac cath and 1-2% or less with angioplasty/stenting.   Carlyle Dolly MD

## 2017-07-13 NOTE — Interval H&P Note (Signed)
History and Physical Interval Note:  07/13/2017 1:49 PM  Gloria Mccoy  has presented today for surgery, with the diagnosis of cp  The various methods of treatment have been discussed with the patient and family. After consideration of risks, benefits and other options for treatment, the patient has consented to  Procedure(s): LEFT HEART CATH AND CORONARY ANGIOGRAPHY (N/A) as a surgical intervention .  The patient's history has been reviewed, patient examined, no change in status, stable for surgery.  I have reviewed the patient's chart and labs.  Questions were answered to the patient's satisfaction.   Cath Lab Visit (complete for each Cath Lab visit)  Clinical Evaluation Leading to the Procedure:   ACS: Yes.    Non-ACS:    Anginal Classification: CCS III  Anti-ischemic medical therapy: No Therapy  Non-Invasive Test Results: No non-invasive testing performed  Prior CABG: No previous CABG        [facility] 07/13/2017 1:49 PM

## 2017-07-13 NOTE — Consult Note (Deleted)
Wrong note type chosen. See H/P.

## 2017-07-13 NOTE — ED Triage Notes (Signed)
Pt c/o mid chest squeezing x 3 weeks. Has gotten worse past few days and gets worse with lifting or walking far. A/o c/o nausea and sob this am with the pain. Pt non diaphoretic. Mild sob noted with walking back to room and undressing

## 2017-07-13 NOTE — ED Notes (Signed)
[facility] in to get pt. Report given to [facility] at cath lab

## 2017-07-13 NOTE — ED Provider Notes (Signed)
[facility] EMERGENCY DEPARTMENT Provider Note   CSN: [id number] Arrival date & time: 07/13/17  4008     History   Chief Complaint Chief Complaint  Patient presents with  . Chest Pain    HPI Gloria Mccoy is a 72 y.o. female.  HPI  Pt was seen at 0910. Per pt, c/o gradual onset and worsening of multiple intermittent episodes chest pain for the past 3 weeks.  Pt describes the CP as "squeezing," states occurs with exertion, lasts 5-23min and improves with resting. Has been associated with SOB and nausea. Pt states her symptoms were occurring "once a week" but have now increased to "every day this week." Pt's symptoms occurred this morning "when I took the trash out to the end of the driveway." Denies symptoms currently. Denies hx of similar symptoms. Denies back pain, no abd pain, no vomiting/diarrhea, no cough, no palpitations, no fevers.     Past Medical History:  Diagnosis Date  . Arthritis   . Hypothyroid   . Meniere disease   . Osteopenia 01/2012   T score -1.3 FRAX 7.9%/0.6%    Patient Active Problem List   Diagnosis Date Noted  . Hypothyroid 01/06/2012  . Meniere disease   . Arthritis     Past Surgical History:  Procedure Laterality Date  . CATARACT EXTRACTION W/PHACO Right 01/28/2015   Procedure: CATARACT EXTRACTION PHACO AND INTRAOCULAR LENS PLACEMENT :  CDE:  5.70;  Surgeon: Rutherford Guys, MD;  Location: [facility] OR[facility];  Service: Ophthalmology;  Laterality: Right;  . CATARACT EXTRACTION W/PHACO Left 02/11/2015   Procedure: CATARACT EXTRACTION PHACO AND INTRAOCULAR LENS PLACEMENT (IOC);  Surgeon: Rutherford Guys, MD;  Location: [facility] ORS;  Service: Ophthalmology;  Laterality: Left;  CDE: 7.38  . COLONOSCOPY N/A 09/26/2012   Procedure: COLONOSCOPY;  Surgeon: Jamesetta So, MD;  Location: [facility] END[facility] SU[facility];  Service: Gastroenterology;  Laterality: N/A;  . EAR SURGERY Left    fluid from ear drum  . TUBAL LIGATION      OB History    Gravida Para Term Preterm AB Living   2 2 2     0 2   SAB TAB Ectopic Multiple Live Births                   Home Medications    Prior to Admission medications   Medication Sig Start Date End Date Taking? Authorizing Provider  aspirin 81 MG tablet Take 81 mg by mouth daily.   Yes [provider]  Cholecalciferol (VITAMIN D PO) Take 1 tablet by mouth daily.    Yes [provider]  Cyanocobalamin (VITAMIN B-12 PO) Take 1 tablet by mouth daily.   Yes [provider]  fish oil-omega-3 fatty acids 1000 MG capsule Take 1 g by mouth daily.   Yes [provider]  levothyroxine (SYNTHROID, LEVOTHROID) 75 MCG tablet Take 75 mcg by mouth daily.   Yes [provider]  naproxen sodium (ANAPROX) 220 MG tablet Take 220 mg by mouth daily as needed (for pain).   Yes [provider]  VITAMIN E PO Take 1 tablet by mouth daily.    Yes [provider]    Family History Family History  Problem Relation Age of Onset  . Diabetes Sister        AODM  . Heart disease Sister 37       CABG  . Heart disease Brother 26  . Cancer Maternal Uncle 60       COLON  .  Diabetes Sister        AODM  . Hypertension Daughter   . Hypertension Daughter   . Breast cancer Cousin        PATERNAL COUSIN    Social History Social History   Tobacco Use  . Smoking status: Never Smoker  . Smokeless tobacco: Never Used  Substance Use Topics  . Alcohol use: Yes    Comment: occas.  . Drug use: No     Allergies   Sulfa antibiotics   Review of Systems Review of Systems ROS: Statement: All systems negative except as marked or noted in the HPI; Constitutional: Negative for fever and chills. ; ; Eyes: Negative for eye pain, redness and discharge. ; ; ENMT: Negative for ear pain, hoarseness, nasal congestion, sinus pressure and sore throat. ; ; Cardiovascular: +CP, SOB. Negative for palpitations, diaphoresis, and peripheral edema. ; ; Respiratory: Negative for cough, wheezing and stridor. ; ;  Gastrointestinal: +nausea. Negative for vomiting, diarrhea, abdominal pain, blood in stool, hematemesis, jaundice and rectal bleeding. . ; ; Genitourinary: Negative for dysuria, flank pain and hematuria. ; ; Musculoskeletal: Negative for back pain and neck pain. Negative for swelling and trauma.; ; Skin: Negative for pruritus, rash, abrasions, blisters, bruising and skin lesion.; ; Neuro: Negative for headache, lightheadedness and neck stiffness. Negative for weakness, altered level of consciousness, altered mental status, extremity weakness, paresthesias, involuntary movement, seizure and syncope.       Physical Exam Updated Vital Signs BP (!) 156/76   Pulse 64   Temp 98.2 F (36.8 C) (Oral)   Resp 15   SpO2 98%   Physical Exam 0915: Physical examination:  Nursing notes reviewed; Vital signs and O2 SAT reviewed;  Constitutional: Well developed, Well nourished, Well hydrated, In no acute distress; Head:  Normocephalic, atraumatic; Eyes: EOMI, PERRL, No scleral icterus; ENMT: Mouth and pharynx normal, Mucous membranes moist; Neck: Supple, Full range of motion, No lymphadenopathy; Cardiovascular: Regular rate and rhythm, No gallop; Respiratory: Breath sounds clear & equal bilaterally, No wheezes.  Speaking full sentences with ease, Normal respiratory effort/excursion; Chest: Nontender, Movement normal; Abdomen: Soft, Nontender, Nondistended, Normal bowel sounds; Genitourinary: No CVA tenderness; Extremities: Pulses normal, No tenderness, No edema, No calf edema or asymmetry.; Neuro: AA&Ox3, Major CN grossly intact.  Speech clear. No gross focal motor or sensory deficits in extremities.; Skin: Color normal, Warm, Dry.   ED Treatments / Results  Labs (all labs ordered are listed, but only abnormal results are displayed)   EKG  EKG Interpretation  Date/Time:  Wednesday July 13 2017 09:03:07 EST Ventricular Rate:  72 PR Interval:    QRS Duration: 90 QT Interval:  503 QTC  Calculation: 551 R Axis:   -35 Text Interpretation:  Sinus rhythm Left axis deviation Low voltage, precordial leads Borderline repolarization abnormality Prolonged QT interval Baseline wander When compared with ECG of 01/22/2015 QT has lengthened Confirmed by Francine Graven [id number]) on 07/13/2017 9:18:56 AM       Radiology   Procedures Procedures (including critical care time)  Medications Ordered in ED Medications  aspirin chewable tablet 324 mg (324 mg Oral Given 07/13/17 0924)     Initial Impression / Assessment and Plan / ED Course  I have reviewed the triage vital signs and the nursing notes.  Pertinent labs & imaging results that were available during my care of the patient were reviewed by me and considered in my medical decision making (see chart for details).  MDM Reviewed: previous chart, nursing note and vitals  Reviewed previous: labs and ECG Interpretation: labs, ECG and x-ray Total time providing critical care: 30-74 minutes. This excludes time spent performing separately reportable procedures and services. Consults: cardiology and admitting MD   CRITICAL CARE Performed by: Alfonzo Feller Total critical care time: 35 minutes Critical care time was exclusive of separately billable procedures and treating other patients. Critical care was necessary to treat or prevent imminent or life-threatening deterioration. Critical care was time spent personally by me on the following activities: development of treatment plan with patient and/or surrogate as well as nursing, discussions with consultants, evaluation of patient's response to treatment, examination of patient, obtaining history from patient or surrogate, ordering and performing treatments and interventions, ordering and review of laboratory studies, ordering and review of radiographic studies, pulse oximetry and re-evaluation of patient's condition.   Results for orders placed or performed during the hospital  encounter of 64/33/29  Basic metabolic panel  Result Value Ref Range   Sodium 141 135 - 145 mmol/L   Potassium 4.1 3.5 - 5.1 mmol/L   Chloride 104 101 - 111 mmol/L   CO2 27 22 - 32 mmol/L   Glucose, Bld 109 (H) 65 - 99 mg/dL   BUN 8 6 - 20 mg/dL   Creatinine, Ser 0.83 0.44 - 1.00 mg/dL   Calcium 9.0 8.9 - 10.3 mg/dL   GFR calc non Af Amer >60 >60 mL/min   GFR calc Af Amer >60 >60 mL/min   Anion gap 10 5 - 15  CBC with Differential  Result Value Ref Range   WBC 9.5 4.0 - 10.5 K/uL   RBC 4.54 3.87 - 5.11 MIL/uL   Hemoglobin 13.5 12.0 - 15.0 g/dL   HCT 39.2 36.0 - 46.0 %   MCV 86.3 78.0 - 100.0 fL   MCH 29.7 26.0 - 34.0 pg   MCHC 34.4 30.0 - 36.0 g/dL   RDW 13.7 11.5 - 15.5 %   Platelets 271 150 - 400 K/uL   Neutrophils Relative % 57 %   Neutro Abs 5.3 1.7 - 7.7 K/uL   Lymphocytes Relative 31 %   Lymphs Abs 3.0 0.7 - 4.0 K/uL   Monocytes Relative 9 %   Monocytes Absolute 0.9 0.1 - 1.0 K/uL   Eosinophils Relative 3 %   Eosinophils Absolute 0.3 0.0 - 0.7 K/uL   Basophils Relative 0 %   Basophils Absolute 0.0 0.0 - 0.1 K/uL  Troponin I  Result Value Ref Range   Troponin I 0.03 (HH) <0.03 ng/mL  Brain natriuretic peptide  Result Value Ref Range   B Natriuretic Peptide 55.0 0.0 - 100.0 pg/mL  Magnesium  Result Value Ref Range   Magnesium 1.9 1.7 - 2.4 mg/dL   Dg Chest 2 View Result Date: 07/13/2017 CLINICAL DATA:  Chest pain for several weeks with shortness of Breath EXAM: CHEST  2 VIEW COMPARISON:  08/25/2015 FINDINGS: Cardiac shadow remains at the upper limits of normal in size. The lungs are well aerated bilaterally. Mild atelectatic changes are noted in the bases. No focal infiltrate or sizable effusion is seen. No acute bony abnormality is noted. IMPRESSION: Mild bibasilar atelectasis. Electronically Signed   By: Inez Catalina M.D.   On: 07/13/2017 09:52    1045:  ASA given. Pt remains asymptomatic while in the ED. Troponin borderline, EKG essentially unchanged from  previous. Concerning symptoms for unstable angina; will admit. T/C to [facility] Dr. Harl Bowie, case discussed, including:  HPI, pertinent PM/SHx, VS/PE, dx testing, ED course and treatment:  Agreeable  to consult.   1115:  T/C to [facility] Dr. Roderic Palau, case discussed, including:  HPI, pertinent PM/SHx, VS/PE, dx testing, ED course and treatment:  Agreeable to admit.   1135:  [facility] has evaluated pt in the ED: they have decided they will transfer pt to [facility] Cards service for cardiac cath. [facility] MD made aware.    Final Clinical Impressions(s) / ED Diagnoses   Final diagnoses:  None    ED Discharge Orders    None       Francine Graven, DO 07/16/17 [id number]

## 2017-07-13 NOTE — ED Notes (Signed)
CRITICAL VALUE ALERT  Critical Value: 0.03  Date & Time Notied:  1006 07/13/17  Provider Notified: Mcmanus  Orders Received/Actions taken:

## 2017-07-13 NOTE — ED Notes (Signed)
Dr Branch at bedside. ?

## 2017-07-14 ENCOUNTER — Observation Stay (HOSPITAL_COMMUNITY): Payer: Medicare HMO

## 2017-07-14 ENCOUNTER — Observation Stay (HOSPITAL_BASED_OUTPATIENT_CLINIC_OR_DEPARTMENT_OTHER): Payer: Medicare HMO

## 2017-07-14 ENCOUNTER — Encounter (HOSPITAL_COMMUNITY): Payer: Self-pay | Admitting: Cardiology

## 2017-07-14 DIAGNOSIS — Z882 Allergy status to sulfonamides status: Secondary | ICD-10-CM | POA: Diagnosis not present

## 2017-07-14 DIAGNOSIS — D62 Acute posthemorrhagic anemia: Secondary | ICD-10-CM | POA: Diagnosis not present

## 2017-07-14 DIAGNOSIS — Z01810 Encounter for preprocedural cardiovascular examination: Secondary | ICD-10-CM | POA: Diagnosis not present

## 2017-07-14 DIAGNOSIS — I200 Unstable angina: Secondary | ICD-10-CM | POA: Diagnosis not present

## 2017-07-14 DIAGNOSIS — I10 Essential (primary) hypertension: Secondary | ICD-10-CM | POA: Diagnosis present

## 2017-07-14 DIAGNOSIS — N179 Acute kidney failure, unspecified: Secondary | ICD-10-CM | POA: Diagnosis present

## 2017-07-14 DIAGNOSIS — E669 Obesity, unspecified: Secondary | ICD-10-CM | POA: Diagnosis present

## 2017-07-14 DIAGNOSIS — Z7982 Long term (current) use of aspirin: Secondary | ICD-10-CM | POA: Diagnosis not present

## 2017-07-14 DIAGNOSIS — E785 Hyperlipidemia, unspecified: Secondary | ICD-10-CM | POA: Diagnosis present

## 2017-07-14 DIAGNOSIS — K5900 Constipation, unspecified: Secondary | ICD-10-CM | POA: Diagnosis not present

## 2017-07-14 DIAGNOSIS — J9811 Atelectasis: Secondary | ICD-10-CM | POA: Diagnosis not present

## 2017-07-14 DIAGNOSIS — Z6835 Body mass index (BMI) 35.0-35.9, adult: Secondary | ICD-10-CM | POA: Diagnosis not present

## 2017-07-14 DIAGNOSIS — I25110 Atherosclerotic heart disease of native coronary artery with unstable angina pectoris: Secondary | ICD-10-CM | POA: Diagnosis not present

## 2017-07-14 DIAGNOSIS — Z79890 Hormone replacement therapy (postmenopausal): Secondary | ICD-10-CM | POA: Diagnosis not present

## 2017-07-14 DIAGNOSIS — E039 Hypothyroidism, unspecified: Secondary | ICD-10-CM | POA: Diagnosis present

## 2017-07-14 DIAGNOSIS — Z8249 Family history of ischemic heart disease and other diseases of the circulatory system: Secondary | ICD-10-CM | POA: Diagnosis not present

## 2017-07-14 DIAGNOSIS — D696 Thrombocytopenia, unspecified: Secondary | ICD-10-CM | POA: Diagnosis not present

## 2017-07-14 DIAGNOSIS — F17290 Nicotine dependence, other tobacco product, uncomplicated: Secondary | ICD-10-CM | POA: Diagnosis present

## 2017-07-14 DIAGNOSIS — E1165 Type 2 diabetes mellitus with hyperglycemia: Secondary | ICD-10-CM | POA: Diagnosis present

## 2017-07-14 DIAGNOSIS — M8580 Other specified disorders of bone density and structure, unspecified site: Secondary | ICD-10-CM | POA: Diagnosis present

## 2017-07-14 DIAGNOSIS — R0789 Other chest pain: Secondary | ICD-10-CM | POA: Diagnosis present

## 2017-07-14 DIAGNOSIS — Z833 Family history of diabetes mellitus: Secondary | ICD-10-CM | POA: Diagnosis not present

## 2017-07-14 DIAGNOSIS — Z79899 Other long term (current) drug therapy: Secondary | ICD-10-CM | POA: Diagnosis not present

## 2017-07-14 DIAGNOSIS — H8109 Meniere's disease, unspecified ear: Secondary | ICD-10-CM | POA: Diagnosis present

## 2017-07-14 DIAGNOSIS — E8770 Fluid overload, unspecified: Secondary | ICD-10-CM | POA: Diagnosis not present

## 2017-07-14 DIAGNOSIS — M1990 Unspecified osteoarthritis, unspecified site: Secondary | ICD-10-CM | POA: Diagnosis present

## 2017-07-14 LAB — PULMONARY FUNCTION TEST
FEF 25-75 Post: 2.01 L/sec
FEF 25-75 Pre: 1.65 L/sec
FEF2575-%Change-Post: 22 %
FEF2575-%PRED-PRE: 112 %
FEF2575-%Pred-Post: 137 %
FEV1-%CHANGE-POST: 6 %
FEV1-%PRED-PRE: 80 %
FEV1-%Pred-Post: 85 %
FEV1-PRE: 1.27 L
FEV1-Post: 1.35 L
FEV1FVC-%CHANGE-POST: 3 %
FEV1FVC-%Pred-Pre: 107 %
FEV6-%CHANGE-POST: 2 %
FEV6-%PRED-POST: 79 %
FEV6-%PRED-PRE: 77 %
FEV6-PRE: 1.53 L
FEV6-Post: 1.57 L
FEV6FVC-%PRED-PRE: 104 %
FEV6FVC-%Pred-Post: 104 %
FVC-%Change-Post: 2 %
FVC-%Pred-Post: 75 %
FVC-%Pred-Pre: 74 %
FVC-POST: 1.57 L
FVC-Pre: 1.53 L
POST FEV6/FVC RATIO: 100 %
PRE FEV6/FVC RATIO: 100 %
Post FEV1/FVC ratio: 86 %
Pre FEV1/FVC ratio: 83 %

## 2017-07-14 LAB — BLOOD GAS, ARTERIAL
Acid-Base Excess: 2.1 mmol/L — ABNORMAL HIGH (ref 0.0–2.0)
Bicarbonate: 26.2 mmol/L (ref 20.0–28.0)
DRAWN BY: 257081
FIO2: 21
O2 SAT: 94.2 %
PATIENT TEMPERATURE: 98.6
PCO2 ART: 41.3 mmHg (ref 32.0–48.0)
pH, Arterial: 7.418 (ref 7.350–7.450)
pO2, Arterial: 72.7 mmHg — ABNORMAL LOW (ref 83.0–108.0)

## 2017-07-14 LAB — TROPONIN I: Troponin I: 0.03 ng/mL (ref <0.03)

## 2017-07-14 LAB — HEMOGLOBIN A1C
Hgb A1c MFr Bld: 5.6 % (ref 4.8–5.6)
MEAN PLASMA GLUCOSE: 114.02 mg/dL

## 2017-07-14 LAB — BASIC METABOLIC PANEL
ANION GAP: 8 (ref 5–15)
BUN: 15 mg/dL (ref 6–20)
CO2: 24 mmol/L (ref 22–32)
Calcium: 8.5 mg/dL — ABNORMAL LOW (ref 8.9–10.3)
Chloride: 108 mmol/L (ref 101–111)
Creatinine, Ser: 1.02 mg/dL — ABNORMAL HIGH (ref 0.44–1.00)
GFR calc Af Amer: >60 mL/min (ref >60)
GFR, EST NON AFRICAN AMERICAN: 54 mL/min — AB (ref >60)
Glucose, Bld: 102 mg/dL — ABNORMAL HIGH (ref 65–99)
POTASSIUM: 3.6 mmol/L (ref 3.5–5.1)
SODIUM: 140 mmol/L (ref 135–145)

## 2017-07-14 LAB — HEPARIN LEVEL (UNFRACTIONATED)
HEPARIN UNFRACTIONATED: 0.45 [IU]/mL (ref 0.30–0.70)
Heparin Unfractionated: 0.18 IU/mL — ABNORMAL LOW (ref 0.30–0.70)

## 2017-07-14 LAB — URINALYSIS, ROUTINE W REFLEX MICROSCOPIC
Bilirubin Urine: NEGATIVE
GLUCOSE, UA: NEGATIVE mg/dL
Hgb urine dipstick: NEGATIVE
Ketones, ur: NEGATIVE mg/dL
Nitrite: NEGATIVE
PROTEIN: NEGATIVE mg/dL
Specific Gravity, Urine: 1.011 (ref 1.005–1.030)
pH: 6 (ref 5.0–8.0)

## 2017-07-14 LAB — ECHOCARDIOGRAM COMPLETE
Height: 63 in
WEIGHTICAEL: 3174.4 [oz_av]

## 2017-07-14 LAB — LIPID PANEL
Cholesterol: 129 mg/dL (ref 0–200)
HDL: 31 mg/dL — AB (ref >40)
LDL Cholesterol: 86 mg/dL (ref 0–99)
TRIGLYCERIDES: 62 mg/dL (ref <150)
Total CHOL/HDL Ratio: 4.2 RATIO
VLDL: 12 mg/dL (ref 0–40)

## 2017-07-14 LAB — CBC
HCT: 35.2 % — ABNORMAL LOW (ref 36.0–46.0)
Hemoglobin: 12.4 g/dL (ref 12.0–15.0)
MCH: 30 pg (ref 26.0–34.0)
MCHC: 35.2 g/dL (ref 30.0–36.0)
MCV: 85 fL (ref 78.0–100.0)
PLATELETS: 242 10*3/uL (ref 150–400)
RBC: 4.14 MIL/uL (ref 3.87–5.11)
RDW: 13.6 % (ref 11.5–15.5)
WBC: 11.5 10*3/uL — AB (ref 4.0–10.5)

## 2017-07-14 LAB — ABO/RH: ABO/RH(D): A POS

## 2017-07-14 LAB — TYPE AND SCREEN
ABO/RH(D): A POS
ANTIBODY SCREEN: NEGATIVE

## 2017-07-14 LAB — SURGICAL PCR SCREEN
MRSA, PCR: NEGATIVE
STAPHYLOCOCCUS AUREUS: NEGATIVE

## 2017-07-14 LAB — APTT: aPTT: 81 seconds — ABNORMAL HIGH (ref 24–36)

## 2017-07-14 MED ORDER — DEXTROSE 5 % IV SOLN
1.5000 g | INTRAVENOUS | Status: AC
  Administered 2017-07-15: 1.5 g via INTRAVENOUS
  Filled 2017-07-14: qty 1.5

## 2017-07-14 MED ORDER — CHLORHEXIDINE GLUCONATE CLOTH 2 % EX PADS
6.0000 | MEDICATED_PAD | Freq: Once | CUTANEOUS | Status: AC
  Administered 2017-07-14: 6 via TOPICAL

## 2017-07-14 MED ORDER — TRANEXAMIC ACID (OHS) PUMP PRIME SOLUTION
2.0000 mg/kg | INTRAVENOUS | Status: DC
  Filled 2017-07-14: qty 1.8

## 2017-07-14 MED ORDER — PLASMA-LYTE 148 IV SOLN
INTRAVENOUS | Status: AC
  Administered 2017-07-15: 500 mL
  Filled 2017-07-14: qty 2.5

## 2017-07-14 MED ORDER — TRANEXAMIC ACID (OHS) BOLUS VIA INFUSION
15.0000 mg/kg | INTRAVENOUS | Status: AC
  Administered 2017-07-15: 1350 mg via INTRAVENOUS
  Filled 2017-07-14: qty 1350

## 2017-07-14 MED ORDER — DIAZEPAM 2 MG PO TABS
2.0000 mg | ORAL_TABLET | Freq: Once | ORAL | Status: AC
  Administered 2017-07-15: 2 mg via ORAL
  Filled 2017-07-14: qty 1

## 2017-07-14 MED ORDER — VANCOMYCIN HCL 10 G IV SOLR
1500.0000 mg | INTRAVENOUS | Status: AC
  Administered 2017-07-15: 1500 mg via INTRAVENOUS
  Filled 2017-07-14: qty 1500

## 2017-07-14 MED ORDER — NITROGLYCERIN IN D5W 200-5 MCG/ML-% IV SOLN
2.0000 ug/min | INTRAVENOUS | Status: AC
  Administered 2017-07-15: 16.6 ug/min via INTRAVENOUS
  Filled 2017-07-14: qty 250

## 2017-07-14 MED ORDER — CHLORHEXIDINE GLUCONATE 0.12 % MT SOLN
15.0000 mL | Freq: Once | OROMUCOSAL | Status: AC
  Administered 2017-07-15: 15 mL via OROMUCOSAL
  Filled 2017-07-14: qty 15

## 2017-07-14 MED ORDER — TEMAZEPAM 15 MG PO CAPS: 15.0000 mg | ORAL_CAPSULE | Freq: Once | ORAL | Status: DC | PRN

## 2017-07-14 MED ORDER — CARVEDILOL 6.25 MG PO TABS
6.2500 mg | ORAL_TABLET | Freq: Two times a day (BID) | ORAL | Status: DC
  Administered 2017-07-14 (×2): 6.25 mg via ORAL
  Filled 2017-07-14 (×2): qty 1

## 2017-07-14 MED ORDER — ALBUTEROL SULFATE (2.5 MG/3ML) 0.083% IN NEBU
2.5000 mg | INHALATION_SOLUTION | Freq: Once | RESPIRATORY_TRACT | Status: AC
  Administered 2017-07-14: 2.5 mg via RESPIRATORY_TRACT

## 2017-07-14 MED ORDER — SODIUM CHLORIDE 0.9 % IV SOLN
Freq: Once | INTRAVENOUS | Status: AC
  Administered 2017-07-14: 12:00:00 via INTRAVENOUS

## 2017-07-14 MED ORDER — POTASSIUM CHLORIDE 2 MEQ/ML IV SOLN
80.0000 meq | INTRAVENOUS | Status: DC
  Filled 2017-07-14: qty 40

## 2017-07-14 MED ORDER — DEXMEDETOMIDINE HCL IN NACL 400 MCG/100ML IV SOLN
0.1000 ug/kg/h | INTRAVENOUS | Status: AC
  Administered 2017-07-15: .5 ug/kg/h via INTRAVENOUS
  Filled 2017-07-14: qty 100

## 2017-07-14 MED ORDER — TRANEXAMIC ACID 1000 MG/10ML IV SOLN
1.5000 mg/kg/h | INTRAVENOUS | Status: AC
  Administered 2017-07-15: 1.5 mg/kg/h via INTRAVENOUS
  Filled 2017-07-14: qty 25

## 2017-07-14 MED ORDER — DEXTROSE 5 % IV SOLN
750.0000 mg | INTRAVENOUS | Status: DC
  Filled 2017-07-14: qty 750

## 2017-07-14 MED ORDER — HEPARIN SODIUM (PORCINE) 1000 UNIT/ML IJ SOLN
INTRAMUSCULAR | Status: DC
  Filled 2017-07-14: qty 30

## 2017-07-14 MED ORDER — DOPAMINE-DEXTROSE 3.2-5 MG/ML-% IV SOLN
0.0000 ug/kg/min | INTRAVENOUS | Status: DC
  Filled 2017-07-14: qty 250

## 2017-07-14 MED ORDER — CHLORHEXIDINE GLUCONATE CLOTH 2 % EX PADS
6.0000 | MEDICATED_PAD | Freq: Once | CUTANEOUS | Status: AC
  Administered 2017-07-15: 6 via TOPICAL

## 2017-07-14 MED ORDER — INSULIN REGULAR HUMAN 100 UNIT/ML IJ SOLN
INTRAMUSCULAR | Status: AC
  Administered 2017-07-15: .9 [IU]/h via INTRAVENOUS
  Filled 2017-07-14: qty 1

## 2017-07-14 MED ORDER — PHENYLEPHRINE HCL 10 MG/ML IJ SOLN
30.0000 ug/min | INTRAMUSCULAR | Status: AC
  Administered 2017-07-15: 25 ug/min via INTRAVENOUS
  Filled 2017-07-14: qty 2

## 2017-07-14 MED ORDER — BISACODYL 5 MG PO TBEC
5.0000 mg | DELAYED_RELEASE_TABLET | Freq: Once | ORAL | Status: AC
  Administered 2017-07-14: 5 mg via ORAL
  Filled 2017-07-14: qty 1

## 2017-07-14 MED ORDER — EPINEPHRINE PF 1 MG/ML IJ SOLN
0.0000 ug/min | INTRAVENOUS | Status: DC
  Filled 2017-07-14: qty 4

## 2017-07-14 MED ORDER — METOPROLOL TARTRATE 12.5 MG HALF TABLET
12.5000 mg | ORAL_TABLET | Freq: Once | ORAL | Status: AC
  Administered 2017-07-15: 12.5 mg via ORAL
  Filled 2017-07-14: qty 1

## 2017-07-14 MED ORDER — MILRINONE LACTATE IN DEXTROSE 20-5 MG/100ML-% IV SOLN
0.1250 ug/kg/min | INTRAVENOUS | Status: DC
  Filled 2017-07-14: qty 100

## 2017-07-14 MED ORDER — MAGNESIUM SULFATE 50 % IJ SOLN
40.0000 meq | INTRAMUSCULAR | Status: DC
  Filled 2017-07-14: qty 9.85

## 2017-07-14 MED FILL — Nitroglycerin IV Soln 100 MCG/ML in D5W: INTRA_ARTERIAL | Qty: 10 | Status: AC

## 2017-07-14 NOTE — Consult Note (Signed)
Reason for Consult:CAD Referring Physician: Dr. Branch  Gloria Mccoy is an 71 y.o. female.  HPI: 71 yo woman with a history of hypertension, tobacco use, obesity, arthritis, hypothyroidism and vertigo. She started having substernal CP with exertion about 3 weeks ago. Accompanied by dyspnea. No radiation. Yesterday she had a severe episode after taking out her trash. Deep pain in the chest with SOB and nausea and diaphoresis. She went to the ED.  Her initial troponin was 0.03. It later rose to 0.07. ECG showed some T wave inversions. She was taken to the cath lab and was found to have severe single vessel CAD. She has been pain free since admission.  Past Medical History:  Diagnosis Date  . Arthritis    "hands sometimes" (07/13/2017)  . Coronary artery disease   . Dips tobacco   . Essential hypertension   . Hypothyroid   . Meniere disease   . Obesity   . Osteopenia 01/2012   T score -1.3 FRAX 7.9%/0.6%    Past Surgical History:  Procedure Laterality Date  . CARDIAC CATHETERIZATION  07/13/2017  . CATARACT EXTRACTION W/PHACO Right 01/28/2015   Procedure: CATARACT EXTRACTION PHACO AND INTRAOCULAR LENS PLACEMENT :  CDE:  5.70;  Surgeon: Mark Shapiro, MD;  Location: [facility];  Service: Ophthalmology;  Laterality: Right;  . CATARACT EXTRACTION W/PHACO Left 02/11/2015   Procedure: CATARACT EXTRACTION PHACO AND INTRAOCULAR LENS PLACEMENT (IOC);  Surgeon: Mark Shapiro, MD;  Location: [facility];  Service: Ophthalmology;  Laterality: Left;  CDE: 7.38  . COLONOSCOPY N/A 09/26/2012   Procedure: COLONOSCOPY;  Surgeon: Mark A Jenkins, MD;  Location: [facility];  Service: Gastroenterology;  Laterality: N/A;  . LEFT HEART CATH AND CORONARY ANGIOGRAPHY N/A 07/13/2017   Procedure: LEFT HEART CATH AND CORONARY ANGIOGRAPHY;  Surgeon: Jordan, Peter M, MD;  Location: [facility] INVASIVE [facility] LAB;  Service: Cardiovascular;  Laterality: N/A;  . TUBAL LIGATION    . TYMPANOPLASTY Left    fluid from ear drum    Family  History  Problem Relation Age of Onset  . Diabetes Sister        AODM  . Heart disease Sister 36       CABG  . Heart disease Brother 55       In his 50s  . Cancer Maternal Uncle 60       COLON  . Diabetes Sister        AODM  . Hypertension Daughter   . Hypertension Daughter   . Breast cancer Cousin        PATERNAL COUSIN  . Heart attack Father        In his 60s    Social History:  reports that  has never smoked. Her smokeless tobacco use includes snuff. She reports that she drinks about 0.6 oz of alcohol per week. She reports that she does not use drugs.  Allergies:  Allergies  Allergen Reactions  . Sulfa Antibiotics Shortness Of Breath  . Sulfasalazine Shortness Of Breath    Medications:  Prior to Admission:  Medications Prior to Admission  Medication Sig Dispense Refill Last Dose  . aspirin 81 MG tablet Take 81 mg by mouth daily.   07/13/2017 at Unknown time  . Cholecalciferol (VITAMIN D PO) Take 1 tablet by mouth daily.    07/12/2017 at Unknown time  . Cyanocobalamin (VITAMIN B-12 PO) Take 1 tablet by mouth daily.   07/12/2017 at Unknown time  . fish oil-omega-3 fatty acids 1000 MG capsule Take   1 g by mouth daily.   07/12/2017 at Unknown time  . levothyroxine (SYNTHROID, LEVOTHROID) 75 MCG tablet Take 75 mcg by mouth daily.   07/13/2017 at Unknown time  . naproxen sodium (ANAPROX) 220 MG tablet Take 220 mg by mouth daily as needed (for pain).   Past Week at Unknown time  . VITAMIN E PO Take 1 tablet by mouth daily.    07/12/2017 at Unknown time    Results for orders placed or performed during the hospital encounter of 07/13/17 (from the past 48 hour(s))  Basic metabolic panel     Status: Abnormal   Collection Time: 07/13/17  9:08 AM  Result Value Ref Range   Sodium 141 135 - 145 mmol/L   Potassium 4.1 3.5 - 5.1 mmol/L   Chloride 104 101 - 111 mmol/L   CO2 27 22 - 32 mmol/L   Glucose, Bld 109 (H) 65 - 99 mg/dL   BUN 8 6 - 20 mg/dL   Creatinine, Ser 0.83 0.44 - 1.00  mg/dL   Calcium 9.0 8.9 - 10.3 mg/dL   GFR calc non Af Amer >60 >60 mL/min   GFR calc Af Amer >60 >60 mL/min    Comment: (NOTE) The eGFR has been calculated using the CKD EPI equation. This calculation has not been validated in all clinical situations. eGFR's persistently <60 mL/min signify possible Chronic Kidney Disease. CORRECTED ON 01/23 AT 1000: PREVIOUSLY REPORTED AS >60    Anion gap 10 5 - 15  CBC with Differential     Status: None   Collection Time: 07/13/17  9:08 AM  Result Value Ref Range   WBC 9.5 4.0 - 10.5 K/uL   RBC 4.54 3.87 - 5.11 MIL/uL   Hemoglobin 13.5 12.0 - 15.0 g/dL   HCT 39.2 36.0 - 46.0 %   MCV 86.3 78.0 - 100.0 fL   MCH 29.7 26.0 - 34.0 pg   MCHC 34.4 30.0 - 36.0 g/dL   RDW 13.7 11.5 - 15.5 %   Platelets 271 150 - 400 K/uL   Neutrophils Relative % 57 %   Neutro Abs 5.3 1.7 - 7.7 K/uL   Lymphocytes Relative 31 %   Lymphs Abs 3.0 0.7 - 4.0 K/uL   Monocytes Relative 9 %   Monocytes Absolute 0.9 0.1 - 1.0 K/uL   Eosinophils Relative 3 %   Eosinophils Absolute 0.3 0.0 - 0.7 K/uL   Basophils Relative 0 %   Basophils Absolute 0.0 0.0 - 0.1 K/uL  Troponin I     Status: Abnormal   Collection Time: 07/13/17  9:08 AM  Result Value Ref Range   Troponin I 0.03 (HH) <0.03 ng/mL    Comment: CRITICAL RESULT CALLED TO, READ BACK BY AND VERIFIED WITH: GENTRY R AT 1005 ON 07/13/17 BY FESTERMAN C CORRECTED ON 01/23 AT 1000: PREVIOUSLY REPORTED AS 0.03 CRITICAL RESULT CALLED TO, READ BACK BY AND VERIFIED WITH: GENTRY,R AT 10:05AM ON 07/13/17 BY FESTERMAN,C   Magnesium     Status: None   Collection Time: 07/13/17  9:08 AM  Result Value Ref Range   Magnesium 1.9 1.7 - 2.4 mg/dL  Brain natriuretic peptide     Status: None   Collection Time: 07/13/17  9:13 AM  Result Value Ref Range   B Natriuretic Peptide 55.0 0.0 - 100.0 pg/mL  Protime-INR     Status: None   Collection Time: 07/13/17 11:28 AM  Result Value Ref Range   Prothrombin Time 12.8 11.4 - 15.2 seconds  INR 0.97   Troponin I     Status: Abnormal   Collection Time: 07/13/17 12:16 PM  Result Value Ref Range   Troponin I 0.07 (HH) <0.03 ng/mL    Comment: CRITICAL RESULT CALLED TO, READ BACK BY AND VERIFIED WITH: KEMP,C AT 4656 ON 1.23.2018 BY ISLEY,B   TSH     Status: None   Collection Time: 07/13/17 12:16 PM  Result Value Ref Range   TSH 3.780 0.350 - 4.500 uIU/mL    Comment: Performed by a 3rd Generation assay with a functional sensitivity of <=0.01 uIU/mL.  Hepatic function panel     Status: None   Collection Time: 07/13/17 12:16 PM  Result Value Ref Range   Total Protein 7.4 6.5 - 8.1 g/dL   Albumin 3.7 3.5 - 5.0 g/dL   AST 18 15 - 41 U/L   ALT 14 14 - 54 U/L   Alkaline Phosphatase 57 38 - 126 U/L   Total Bilirubin 0.8 0.3 - 1.2 mg/dL   Bilirubin, Direct 0.1 0.1 - 0.5 mg/dL   Indirect Bilirubin 0.7 0.3 - 0.9 mg/dL  CBC     Status: Abnormal   Collection Time: 07/14/17  3:33 AM  Result Value Ref Range   WBC 11.5 (H) 4.0 - 10.5 K/uL   RBC 4.14 3.87 - 5.11 MIL/uL   Hemoglobin 12.4 12.0 - 15.0 g/dL   HCT 35.2 (L) 36.0 - 46.0 %   MCV 85.0 78.0 - 100.0 fL   MCH 30.0 26.0 - 34.0 pg   MCHC 35.2 30.0 - 36.0 g/dL   RDW 13.6 11.5 - 15.5 %   Platelets 242 150 - 400 K/uL  Basic metabolic panel     Status: Abnormal   Collection Time: 07/14/17  3:33 AM  Result Value Ref Range   Sodium 140 135 - 145 mmol/L   Potassium 3.6 3.5 - 5.1 mmol/L   Chloride 108 101 - 111 mmol/L   CO2 24 22 - 32 mmol/L   Glucose, Bld 102 (H) 65 - 99 mg/dL   BUN 15 6 - 20 mg/dL   Creatinine, Ser 1.02 (H) 0.44 - 1.00 mg/dL   Calcium 8.5 (L) 8.9 - 10.3 mg/dL   GFR calc non Af Amer 54 (L) >60 mL/min   GFR calc Af Amer >60 >60 mL/min    Comment: (NOTE) The eGFR has been calculated using the CKD EPI equation. This calculation has not been validated in all clinical situations. eGFR's persistently <60 mL/min signify possible Chronic Kidney Disease.    Anion gap 8 5 - 15  Heparin level (unfractionated)      Status: Abnormal   Collection Time: 07/14/17  3:33 AM  Result Value Ref Range   Heparin Unfractionated 0.18 (L) 0.30 - 0.70 IU/mL    Comment:        IF HEPARIN RESULTS ARE BELOW EXPECTED VALUES, AND PATIENT DOSAGE HAS BEEN CONFIRMED, SUGGEST FOLLOW UP TESTING OF ANTITHROMBIN III LEVELS.   Lipid panel     Status: Abnormal   Collection Time: 07/14/17  3:33 AM  Result Value Ref Range   Cholesterol 129 0 - 200 mg/dL   Triglycerides 62 <150 mg/dL   HDL 31 (L) >40 mg/dL   Total CHOL/HDL Ratio 4.2 RATIO   VLDL 12 0 - 40 mg/dL   LDL Cholesterol 86 0 - 99 mg/dL    Comment:        Total Cholesterol/HDL:CHD Risk Coronary Heart Disease Risk Table  Men   Women  1/2 Average Risk   3.4   3.3  Average Risk       5.0   4.4  2 X Average Risk   9.6   7.1  3 X Average Risk  23.4   11.0        Use the calculated Patient Ratio above and the CHD Risk Table to determine the patient's CHD Risk.        ATP III CLASSIFICATION (LDL):  <100     mg/dL   Optimal  100-129  mg/dL   Near or Above                    Optimal  130-159  mg/dL   Borderline  160-189  mg/dL   High  >190     mg/dL   Very High   Hemoglobin A1c     Status: None   Collection Time: 07/14/17  3:33 AM  Result Value Ref Range   Hgb A1c MFr Bld 5.6 4.8 - 5.6 %    Comment: (NOTE) Pre diabetes:          5.7%-6.4% Diabetes:              >6.4% Glycemic control for   <7.0% adults with diabetes    Mean Plasma Glucose 114.02 mg/dL  Heparin level (unfractionated)     Status: None   Collection Time: 07/14/17 11:39 AM  Result Value Ref Range   Heparin Unfractionated 0.45 0.30 - 0.70 IU/mL    Comment:        IF HEPARIN RESULTS ARE BELOW EXPECTED VALUES, AND PATIENT DOSAGE HAS BEEN CONFIRMED, SUGGEST FOLLOW UP TESTING OF ANTITHROMBIN III LEVELS.     Dg Chest 2 View  Result Date: 07/13/2017 CLINICAL DATA:  Chest pain for several weeks with shortness of Breath EXAM: CHEST  2 VIEW COMPARISON:  08/25/2015  FINDINGS: Cardiac shadow remains at the upper limits of normal in size. The lungs are well aerated bilaterally. Mild atelectatic changes are noted in the bases. No focal infiltrate or sizable effusion is seen. No acute bony abnormality is noted. IMPRESSION: Mild bibasilar atelectasis. Electronically Signed   By: Inez Catalina M.D.   On: 07/13/2017 09:52    Review of Systems  Constitutional: Positive for malaise/fatigue. Negative for chills and fever.  HENT: Positive for hearing loss.        Vertigo  Eyes: Negative for blurred vision and double vision.  Respiratory: Positive for shortness of breath. Negative for wheezing.   Cardiovascular: Positive for chest pain. Negative for palpitations, orthopnea and leg swelling.  Gastrointestinal: Positive for nausea. Negative for vomiting.  Genitourinary: Negative for dysuria and urgency.  Musculoskeletal: Positive for joint pain (hands). Negative for myalgias.  Neurological: Negative for seizures and loss of consciousness.  All other systems reviewed and are negative.  Blood pressure (!) 150/62, pulse 65, temperature 98.4 F (36.9 C), temperature source Oral, resp. rate 20, height _0  (1.6 m), weight 198 lb 6.4 oz (90 kg), SpO2 96 %. Physical Exam  Vitals reviewed. Constitutional: She is oriented to person, place, and time. No distress.  obese  HENT:  Head: Normocephalic and atraumatic.  Mouth/Throat: No oropharyngeal exudate.  Eyes: Conjunctivae and EOM are normal. No scleral icterus.  Neck: Neck supple. No thyromegaly present.  Cardiovascular: Normal rate, regular rhythm and normal heart sounds. Exam reveals no gallop and no friction rub.  No murmur heard. Respiratory: Effort normal and breath sounds normal. No respiratory  distress. She has no wheezes.  GI: Soft. She exhibits no distension. There is no tenderness.  Musculoskeletal: She exhibits no edema.  Lymphadenopathy:    She has no cervical adenopathy.  Neurological: She is alert and  oriented to person, place, and time. No cranial nerve deficit. She exhibits normal muscle tone.  Skin: Skin is warm and dry.   CARDIAC CATHETERIZATION Conclusion     Mid LM lesion is 25% stenosed.  Dist LM to Ost LAD lesion is 80% stenosed.  Ost Cx to Prox Cx lesion is 30% stenosed.  Ost 1st Mrg lesion is 50% stenosed.  Prox LAD lesion is 90% stenosed.  The left ventricular systolic function is normal.  LV end diastolic pressure is normal.  The left ventricular ejection fraction is 55-65% by visual estimate.   1. Single vessel obstructive CAD. Patient has complex ostial and proximal to mid LAD disease that involves the origin of the ramus intermediate and first diagonal respectively. 2. Normal LV function 3. Normal LVEDP  Plan: given complexity of lesions with ostial and bifurcation LAD disease I would recommend consideration for CABG.   I personally reviewed the Cath images and concur with the findings noted above  Assessment/Plan: 72 yo woman with multiple CRF who presented with an unstable coronary syndrome and has been found to have complex single vessel CAD involving the ostial LAD. RI and D1 also compromised.  CABG is indicated for relief of symptoms and myocardial preservation.  I discussed the general nature of the procedure, the need for general anesthesia, the use of cardiopulmonary bypass, and the incisions to be used with Mrs. Eke and her family. We discussed the expected hospital stay, overall recovery and short and long term outcomes. I informed them of the indications, risks, benefits and alternatives. They understand the risks include, but are not limited to death, stroke, MI, DVT/PE, bleeding, possible need for transfusion, infections, cardiac arrhythmias, as well as other organ system dysfunction including respiratory, renal, or GI complications.   She accepts the risks and agrees to proceed.  Melrose Nakayama 07/14/2017, 5:36 PM

## 2017-07-14 NOTE — H&P (View-Only) (Signed)
Reason for Consult:CAD Referring Physician: Dr. August Saucer is an 72 y.o. female.  HPI: 72 yo woman with a history of hypertension, tobacco use, obesity, arthritis, hypothyroidism and vertigo. She started having substernal CP with exertion about 3 weeks ago. Accompanied by dyspnea. No radiation. Yesterday she had a severe episode after taking out her trash. Deep pain in the chest with SOB and nausea and diaphoresis. She went to the ED.  Her initial troponin was 0.03. It later rose to 0.07. ECG showed some T wave inversions. She was taken to the cath lab and was found to have severe single vessel CAD. She has been pain free since admission.  Past Medical History:  Diagnosis Date  . Arthritis    "hands sometimes" (07/13/2017)  . Coronary artery disease   . Dips tobacco   . Essential hypertension   . Hypothyroid   . Meniere disease   . Obesity   . Osteopenia 01/2012   T score -1.3 FRAX 7.9%/0.6%    Past Surgical History:  Procedure Laterality Date  . CARDIAC CATHETERIZATION  07/13/2017  . CATARACT EXTRACTION W/PHACO Right 01/28/2015   Procedure: CATARACT EXTRACTION PHACO AND INTRAOCULAR LENS PLACEMENT :  CDE:  5.70;  Surgeon: Rutherford Guys, MD;  Location: [facility];  Service: Ophthalmology;  Laterality: Right;  . CATARACT EXTRACTION W/PHACO Left 02/11/2015   Procedure: CATARACT EXTRACTION PHACO AND INTRAOCULAR LENS PLACEMENT (IOC);  Surgeon: Rutherford Guys, MD;  Location: [facility];  Service: Ophthalmology;  Laterality: Left;  CDE: 7.38  . COLONOSCOPY N/A 09/26/2012   Procedure: COLONOSCOPY;  Surgeon: Jamesetta So, MD;  Location: [facility];  Service: Gastroenterology;  Laterality: N/A;  . LEFT HEART CATH AND CORONARY ANGIOGRAPHY N/A 07/13/2017   Procedure: LEFT HEART CATH AND CORONARY ANGIOGRAPHY;  Surgeon: Martinique, Peter M, MD;  Location: [facility];  Service: Cardiovascular;  Laterality: N/A;  . TUBAL LIGATION    . TYMPANOPLASTY Left    fluid from ear drum    Family  History  Problem Relation Age of Onset  . Diabetes Sister        AODM  . Heart disease Sister 34       CABG  . Heart disease Brother 29       In his 25s  . Cancer Maternal Uncle 60       COLON  . Diabetes Sister        AODM  . Hypertension Daughter   . Hypertension Daughter   . Breast cancer Cousin        PATERNAL COUSIN  . Heart attack Father        In his 56s    Social History:  reports that  has never smoked. Her smokeless tobacco use includes snuff. She reports that she drinks about 0.6 oz of alcohol per week. She reports that she does not use drugs.  Allergies:  Allergies  Allergen Reactions  . Sulfa Antibiotics Shortness Of Breath  . Sulfasalazine Shortness Of Breath    Medications:  Prior to Admission:  Medications Prior to Admission  Medication Sig Dispense Refill Last Dose  . aspirin 81 MG tablet Take 81 mg by mouth daily.   07/13/2017 at Unknown time  . Cholecalciferol (VITAMIN D PO) Take 1 tablet by mouth daily.    07/12/2017 at Unknown time  . Cyanocobalamin (VITAMIN B-12 PO) Take 1 tablet by mouth daily.   07/12/2017 at Unknown time  . fish oil-omega-3 fatty acids 1000 MG capsule Take  1 g by mouth daily.   07/12/2017 at Unknown time  . levothyroxine (SYNTHROID, LEVOTHROID) 75 MCG tablet Take 75 mcg by mouth daily.   07/13/2017 at Unknown time  . naproxen sodium (ANAPROX) 220 MG tablet Take 220 mg by mouth daily as needed (for pain).   Past Week at Unknown time  . VITAMIN E PO Take 1 tablet by mouth daily.    07/12/2017 at Unknown time    Results for orders placed or performed during the hospital encounter of 07/13/17 (from the past 48 hour(s))  Basic metabolic panel     Status: Abnormal   Collection Time: 07/13/17  9:08 AM  Result Value Ref Range   Sodium 141 135 - 145 mmol/L   Potassium 4.1 3.5 - 5.1 mmol/L   Chloride 104 101 - 111 mmol/L   CO2 27 22 - 32 mmol/L   Glucose, Bld 109 (H) 65 - 99 mg/dL   BUN 8 6 - 20 mg/dL   Creatinine, Ser 0.83 0.44 - 1.00  mg/dL   Calcium 9.0 8.9 - 10.3 mg/dL   GFR calc non Af Amer >60 >60 mL/min   GFR calc Af Amer >60 >60 mL/min    Comment: (NOTE) The eGFR has been calculated using the CKD EPI equation. This calculation has not been validated in all clinical situations. eGFR's persistently <60 mL/min signify possible Chronic Kidney Disease. CORRECTED ON 01/23 AT 1000: PREVIOUSLY REPORTED AS >60    Anion gap 10 5 - 15  CBC with Differential     Status: None   Collection Time: 07/13/17  9:08 AM  Result Value Ref Range   WBC 9.5 4.0 - 10.5 K/uL   RBC 4.54 3.87 - 5.11 MIL/uL   Hemoglobin 13.5 12.0 - 15.0 g/dL   HCT 39.2 36.0 - 46.0 %   MCV 86.3 78.0 - 100.0 fL   MCH 29.7 26.0 - 34.0 pg   MCHC 34.4 30.0 - 36.0 g/dL   RDW 13.7 11.5 - 15.5 %   Platelets 271 150 - 400 K/uL   Neutrophils Relative % 57 %   Neutro Abs 5.3 1.7 - 7.7 K/uL   Lymphocytes Relative 31 %   Lymphs Abs 3.0 0.7 - 4.0 K/uL   Monocytes Relative 9 %   Monocytes Absolute 0.9 0.1 - 1.0 K/uL   Eosinophils Relative 3 %   Eosinophils Absolute 0.3 0.0 - 0.7 K/uL   Basophils Relative 0 %   Basophils Absolute 0.0 0.0 - 0.1 K/uL  Troponin I     Status: Abnormal   Collection Time: 07/13/17  9:08 AM  Result Value Ref Range   Troponin I 0.03 (HH) <0.03 ng/mL    Comment: CRITICAL RESULT CALLED TO, READ BACK BY AND VERIFIED WITH: GENTRY R AT 1005 ON 07/13/17 BY FESTERMAN C CORRECTED ON 01/23 AT 1000: PREVIOUSLY REPORTED AS 0.03 CRITICAL RESULT CALLED TO, READ BACK BY AND VERIFIED WITH: GENTRY,R AT 10:05AM ON 07/13/17 BY FESTERMAN,C   Magnesium     Status: None   Collection Time: 07/13/17  9:08 AM  Result Value Ref Range   Magnesium 1.9 1.7 - 2.4 mg/dL  Brain natriuretic peptide     Status: None   Collection Time: 07/13/17  9:13 AM  Result Value Ref Range   B Natriuretic Peptide 55.0 0.0 - 100.0 pg/mL  Protime-INR     Status: None   Collection Time: 07/13/17 11:28 AM  Result Value Ref Range   Prothrombin Time 12.8 11.4 - 15.2 seconds  INR 0.97   Troponin I     Status: Abnormal   Collection Time: 07/13/17 12:16 PM  Result Value Ref Range   Troponin I 0.07 (HH) <0.03 ng/mL    Comment: CRITICAL RESULT CALLED TO, READ BACK BY AND VERIFIED WITH: KEMP,C AT 4656 ON 1.23.2018 BY ISLEY,B   TSH     Status: None   Collection Time: 07/13/17 12:16 PM  Result Value Ref Range   TSH 3.780 0.350 - 4.500 uIU/mL    Comment: Performed by a 3rd Generation assay with a functional sensitivity of <=0.01 uIU/mL.  Hepatic function panel     Status: None   Collection Time: 07/13/17 12:16 PM  Result Value Ref Range   Total Protein 7.4 6.5 - 8.1 g/dL   Albumin 3.7 3.5 - 5.0 g/dL   AST 18 15 - 41 U/L   ALT 14 14 - 54 U/L   Alkaline Phosphatase 57 38 - 126 U/L   Total Bilirubin 0.8 0.3 - 1.2 mg/dL   Bilirubin, Direct 0.1 0.1 - 0.5 mg/dL   Indirect Bilirubin 0.7 0.3 - 0.9 mg/dL  CBC     Status: Abnormal   Collection Time: 07/14/17  3:33 AM  Result Value Ref Range   WBC 11.5 (H) 4.0 - 10.5 K/uL   RBC 4.14 3.87 - 5.11 MIL/uL   Hemoglobin 12.4 12.0 - 15.0 g/dL   HCT 35.2 (L) 36.0 - 46.0 %   MCV 85.0 78.0 - 100.0 fL   MCH 30.0 26.0 - 34.0 pg   MCHC 35.2 30.0 - 36.0 g/dL   RDW 13.6 11.5 - 15.5 %   Platelets 242 150 - 400 K/uL  Basic metabolic panel     Status: Abnormal   Collection Time: 07/14/17  3:33 AM  Result Value Ref Range   Sodium 140 135 - 145 mmol/L   Potassium 3.6 3.5 - 5.1 mmol/L   Chloride 108 101 - 111 mmol/L   CO2 24 22 - 32 mmol/L   Glucose, Bld 102 (H) 65 - 99 mg/dL   BUN 15 6 - 20 mg/dL   Creatinine, Ser 1.02 (H) 0.44 - 1.00 mg/dL   Calcium 8.5 (L) 8.9 - 10.3 mg/dL   GFR calc non Af Amer 54 (L) >60 mL/min   GFR calc Af Amer >60 >60 mL/min    Comment: (NOTE) The eGFR has been calculated using the CKD EPI equation. This calculation has not been validated in all clinical situations. eGFR's persistently <60 mL/min signify possible Chronic Kidney Disease.    Anion gap 8 5 - 15  Heparin level (unfractionated)      Status: Abnormal   Collection Time: 07/14/17  3:33 AM  Result Value Ref Range   Heparin Unfractionated 0.18 (L) 0.30 - 0.70 IU/mL    Comment:        IF HEPARIN RESULTS ARE BELOW EXPECTED VALUES, AND PATIENT DOSAGE HAS BEEN CONFIRMED, SUGGEST FOLLOW UP TESTING OF ANTITHROMBIN III LEVELS.   Lipid panel     Status: Abnormal   Collection Time: 07/14/17  3:33 AM  Result Value Ref Range   Cholesterol 129 0 - 200 mg/dL   Triglycerides 62 <150 mg/dL   HDL 31 (L) >40 mg/dL   Total CHOL/HDL Ratio 4.2 RATIO   VLDL 12 0 - 40 mg/dL   LDL Cholesterol 86 0 - 99 mg/dL    Comment:        Total Cholesterol/HDL:CHD Risk Coronary Heart Disease Risk Table  Men   Women  1/2 Average Risk   3.4   3.3  Average Risk       5.0   4.4  2 X Average Risk   9.6   7.1  3 X Average Risk  23.4   11.0        Use the calculated Patient Ratio above and the CHD Risk Table to determine the patient's CHD Risk.        ATP III CLASSIFICATION (LDL):  <100     mg/dL   Optimal  100-129  mg/dL   Near or Above                    Optimal  130-159  mg/dL   Borderline  160-189  mg/dL   High  >190     mg/dL   Very High   Hemoglobin A1c     Status: None   Collection Time: 07/14/17  3:33 AM  Result Value Ref Range   Hgb A1c MFr Bld 5.6 4.8 - 5.6 %    Comment: (NOTE) Pre diabetes:          5.7%-6.4% Diabetes:              >6.4% Glycemic control for   <7.0% adults with diabetes    Mean Plasma Glucose 114.02 mg/dL  Heparin level (unfractionated)     Status: None   Collection Time: 07/14/17 11:39 AM  Result Value Ref Range   Heparin Unfractionated 0.45 0.30 - 0.70 IU/mL    Comment:        IF HEPARIN RESULTS ARE BELOW EXPECTED VALUES, AND PATIENT DOSAGE HAS BEEN CONFIRMED, SUGGEST FOLLOW UP TESTING OF ANTITHROMBIN III LEVELS.     Dg Chest 2 View  Result Date: 07/13/2017 CLINICAL DATA:  Chest pain for several weeks with shortness of Breath EXAM: CHEST  2 VIEW COMPARISON:  08/25/2015  FINDINGS: Cardiac shadow remains at the upper limits of normal in size. The lungs are well aerated bilaterally. Mild atelectatic changes are noted in the bases. No focal infiltrate or sizable effusion is seen. No acute bony abnormality is noted. IMPRESSION: Mild bibasilar atelectasis. Electronically Signed   By: Inez Catalina M.D.   On: 07/13/2017 09:52    Review of Systems  Constitutional: Positive for malaise/fatigue. Negative for chills and fever.  HENT: Positive for hearing loss.        Vertigo  Eyes: Negative for blurred vision and double vision.  Respiratory: Positive for shortness of breath. Negative for wheezing.   Cardiovascular: Positive for chest pain. Negative for palpitations, orthopnea and leg swelling.  Gastrointestinal: Positive for nausea. Negative for vomiting.  Genitourinary: Negative for dysuria and urgency.  Musculoskeletal: Positive for joint pain (hands). Negative for myalgias.  Neurological: Negative for seizures and loss of consciousness.  All other systems reviewed and are negative.  Blood pressure (!) 150/62, pulse 65, temperature 98.4 F (36.9 C), temperature source Oral, resp. rate 20, height _0  (1.6 m), weight 198 lb 6.4 oz (90 kg), SpO2 96 %. Physical Exam  Vitals reviewed. Constitutional: She is oriented to person, place, and time. No distress.  obese  HENT:  Head: Normocephalic and atraumatic.  Mouth/Throat: No oropharyngeal exudate.  Eyes: Conjunctivae and EOM are normal. No scleral icterus.  Neck: Neck supple. No thyromegaly present.  Cardiovascular: Normal rate, regular rhythm and normal heart sounds. Exam reveals no gallop and no friction rub.  No murmur heard. Respiratory: Effort normal and breath sounds normal. No respiratory  distress. She has no wheezes.  GI: Soft. She exhibits no distension. There is no tenderness.  Musculoskeletal: She exhibits no edema.  Lymphadenopathy:    She has no cervical adenopathy.  Neurological: She is alert and  oriented to person, place, and time. No cranial nerve deficit. She exhibits normal muscle tone.  Skin: Skin is warm and dry.   CARDIAC CATHETERIZATION Conclusion     Mid LM lesion is 25% stenosed.  Dist LM to Ost LAD lesion is 80% stenosed.  Ost Cx to Prox Cx lesion is 30% stenosed.  Ost 1st Mrg lesion is 50% stenosed.  Prox LAD lesion is 90% stenosed.  The left ventricular systolic function is normal.  LV end diastolic pressure is normal.  The left ventricular ejection fraction is 55-65% by visual estimate.   1. Single vessel obstructive CAD. Patient has complex ostial and proximal to mid LAD disease that involves the origin of the ramus intermediate and first diagonal respectively. 2. Normal LV function 3. Normal LVEDP  Plan: given complexity of lesions with ostial and bifurcation LAD disease I would recommend consideration for CABG.   I personally reviewed the Cath images and concur with the findings noted above  Assessment/Plan: 72 yo woman with multiple CRF who presented with an unstable coronary syndrome and has been found to have complex single vessel CAD involving the ostial LAD. RI and D1 also compromised.  CABG is indicated for relief of symptoms and myocardial preservation.  I discussed the general nature of the procedure, the need for general anesthesia, the use of cardiopulmonary bypass, and the incisions to be used with Mrs. Strehl and her family. We discussed the expected hospital stay, overall recovery and short and long term outcomes. I informed them of the indications, risks, benefits and alternatives. They understand the risks include, but are not limited to death, stroke, MI, DVT/PE, bleeding, possible need for transfusion, infections, cardiac arrhythmias, as well as other organ system dysfunction including respiratory, renal, or GI complications.   She accepts the risks and agrees to proceed.  Melrose Nakayama 07/14/2017, 5:36 PM

## 2017-07-14 NOTE — Progress Notes (Signed)
ANTICOAGULATION CONSULT NOTE - Follow Up Consult  Pharmacy Consult for Heparin Indication: CAD  Allergies  Allergen Reactions  . Sulfa Antibiotics Shortness Of Breath  . Sulfasalazine Shortness Of Breath    Patient Measurements: Height: 5\' 3"  (160 cm) Weight: 198 lb 6.4 oz (90 kg) IBW/kg (Calculated) : 52.4 Heparin Dosing Weight:   Vital Signs: Temp: 98.5 F (36.9 C) (01/24 1148) Temp Source: Oral (01/24 1148) BP: 160/85 (01/24 1148) Pulse Rate: 70 (01/24 1148)  Labs: Recent Labs    07/13/17 0908 07/13/17 1128 07/13/17 1216 07/14/17 0333 07/14/17 1139  HGB 13.5  --   --  12.4  --   HCT 39.2  --   --  35.2*  --   PLT 271  --   --  242  --   LABPROT  --  12.8  --   --   --   INR  --  0.97  --   --   --   HEPARINUNFRC  --   --   --  0.18* 0.45  CREATININE 0.83  --   --  1.02*  --   TROPONINI 0.03*  --  0.07*  --   --     Estimated Creatinine Clearance: 53 mL/min (A) (by C-G formula based on SCr of 1.02 mg/dL (H)).   Assessment: Anticoag: Baseline CBC WNL. S/p cath to resume heparin. Hgb 13.5>12.4. OPlts 768>115. AM HL 0.18 low now up to 0.45 in goal range.  Goal of Therapy:  Heparin level 0.3-0.7 units/ml Monitor platelets by anticoagulation protocol: Yes   Plan:  Increase Iv heparin to 1100 units/hr Daily HL and CBC  Sharene Krikorian S. Alford Highland, PharmD, BCPS Clinical Staff Pharmacist Pager [phone]  Eilene Ghazi Stillinger 07/14/2017,2:15 PM

## 2017-07-14 NOTE — Progress Notes (Signed)
Pre-op Cardiac Surgery  Carotid Findings:  1-39% ICA stenosis.  Vertebral artery flow is antegrade.   Upper Extremity Right Left  Brachial Pressures 152T 150T  Radial Waveforms T T  Ulnar Waveforms T T  Palmar Arch (Allen's Test) WNL Doppler signal remains normal with radial compression and reverses with ulnar compression   Findings:      Lower  Extremity Right Left  Dorsalis Pedis    Anterior Tibial T T  Posterior Tibial T T  Ankle/Brachial Indices      Findings:

## 2017-07-14 NOTE — Progress Notes (Signed)
[place] for Heparin Indication: CAD  Allergies  Allergen Reactions  . Sulfa Antibiotics     Difficulty breathing    Patient Measurements: Height: 5\' 3"  (160 cm) Weight: 198 lb 6.4 oz (90 kg) IBW/kg (Calculated) : 52.4 Heparin Dosing Weight:  71.7 kg  Vital Signs: Temp: 98.4 F (36.9 C) (01/24 0340) Temp Source: Oral (01/24 0340) BP: 138/61 (01/24 0340) Pulse Rate: 64 (01/24 0340)  Labs: Recent Labs    07/13/17 0908 07/13/17 1128 07/13/17 1216 07/14/17 0333  HGB 13.5  --   --  12.4  HCT 39.2  --   --  35.2*  PLT 271  --   --  242  LABPROT  --  12.8  --   --   INR  --  0.97  --   --   HEPARINUNFRC  --   --   --  0.18*  CREATININE 0.83  --   --   --   TROPONINI 0.03*  --  0.07*  --     Estimated Creatinine Clearance: 65.2 mL/min (by C-G formula based on SCr of 0.83 mg/dL).   Assessment: 72 y.o. female with CAD s/p cath for heparin  Goal of Therapy:  Heparin level 0.3-0.7 units/ml Monitor platelets by anticoagulation protocol: Yes   Plan:  Increase Heparin  1100 units/hr Check heparin level in 8 hours.  Caryl Pina 07/14/2017,4:46 AM

## 2017-07-14 NOTE — Progress Notes (Signed)
Removed TR band from right radial and applied gauze and Tegaderm. Site is a level 0. Educated pt to leave in place for 24 hours and not get site wet

## 2017-07-14 NOTE — Progress Notes (Signed)
Progress Note  Patient Name: Gloria Mccoy Date of Encounter: 07/14/2017  Primary Cardiologist: New to [facility]; Dr. Harl Bowie   Subjective   Patient reports feeling good this AM. Without CP, SOB. Was able to get out of bed to chair and to the bathroom without exertional chest pain.  Inpatient Medications    Scheduled Meds: . aspirin EC  81 mg Oral Daily  . atorvastatin  80 mg Oral NOW   Followed by  . atorvastatin  80 mg Oral QPM  . levothyroxine  75 mcg Oral QAC breakfast  . nitroGLYCERIN  0.5 inch Topical Q6H  . omega-3 acid ethyl esters  1 g Oral Daily  . sodium chloride flush  3 mL Intravenous Q12H  . sodium chloride flush  3 mL Intravenous Q12H   Continuous Infusions: . sodium chloride    . sodium chloride    . sodium chloride    . heparin 1,100 Units/hr (07/14/17 0452)   PRN Meds: sodium chloride, sodium chloride, acetaminophen, nitroGLYCERIN, ondansetron (ZOFRAN) IV, sodium chloride flush, sodium chloride flush   Vital Signs    Vitals:   07/13/17 1548 07/13/17 2001 07/14/17 0014 07/14/17 0340  BP: (!) 177/77 (!) 131/50 (!) 131/54 138/61  Pulse: 69 65 72 64  Resp: 20 18 16 16   Temp: 98.2 F (36.8 C) 98.4 F (36.9 C) 97.7 F (36.5 C) 98.4 F (36.9 C)  TempSrc: Oral Oral Axillary Oral  SpO2: 99% 94% 94% 95%  Weight:    198 lb 6.4 oz (90 kg)  Height:        Intake/Output Summary (Last 24 hours) at 07/14/2017 0757 Last data filed at 07/14/2017 0400 Gross per 24 hour  Intake 899.67 ml  Output 950 ml  Net -50.33 ml   Filed Weights   07/13/17 1100 07/14/17 0340  Weight: 190 lb (86.2 kg) 198 lb 6.4 oz (90 kg)    Telemetry    NSR - Personally Reviewed   Physical Exam   GEN: Well developed, well nourished obese AAF. Sitting at bedside eating breakfast in no acute distress.   Neck: No JVD, no carotid bruits Cardiac: RRR, no murmurs, rubs, or gallops.  Respiratory: Clear to auscultation bilaterally, no wheezes/ rales/ rhonchi GI: NABS, Soft,  obese, nontender, non-distended  MS: No edema; No deformity. Neuro:  Nonfocal, moving all extremities spontaneously Psych: Normal affect   Labs    Chemistry Recent Labs  Lab 07/13/17 0908 07/13/17 1216 07/14/17 0333  NA 141  --  140  K 4.1  --  3.6  CL 104  --  108  CO2 27  --  24  GLUCOSE 109*  --  102*  BUN 8  --  15  CREATININE 0.83  --  1.02*  CALCIUM 9.0  --  8.5*  PROT  --  7.4  --   ALBUMIN  --  3.7  --   AST  --  18  --   ALT  --  14  --   ALKPHOS  --  57  --   BILITOT  --  0.8  --   GFRNONAA >60  --  54*  GFRAA >60  --  >60  ANIONGAP 10  --  8     Hematology Recent Labs  Lab 07/13/17 0908 07/14/17 0333  WBC 9.5 11.5*  RBC 4.54 4.14  HGB 13.5 12.4  HCT 39.2 35.2*  MCV 86.3 85.0  MCH 29.7 30.0  MCHC 34.4 35.2  RDW 13.7 13.6  PLT 271 242    Cardiac Enzymes Recent Labs  Lab 07/13/17 0908 07/13/17 1216  TROPONINI 0.03* 0.07*   No results for input(s): TROPIPOC in the last 168 hours.   BNP Recent Labs  Lab 07/13/17 0913  BNP 55.0     DDimer No results for input(s): DDIMER in the last 168 hours.   Radiology    Dg Chest 2 View  Result Date: 07/13/2017 CLINICAL DATA:  Chest pain for several weeks with shortness of Breath EXAM: CHEST  2 VIEW COMPARISON:  08/25/2015 FINDINGS: Cardiac shadow remains at the upper limits of normal in size. The lungs are well aerated bilaterally. Mild atelectatic changes are noted in the bases. No focal infiltrate or sizable effusion is seen. No acute bony abnormality is noted. IMPRESSION: Mild bibasilar atelectasis. Electronically Signed   By: Inez Catalina M.D.   On: 07/13/2017 09:52    Cardiac Studies   Left Heart Catheterization: 07/13/2017  Mid LM lesion is 25% stenosed.  Dist LM to Ost LAD lesion is 80% stenosed.  Ost Cx to Prox Cx lesion is 30% stenosed.  Ost 1st Mrg lesion is 50% stenosed.  Prox LAD lesion is 90% stenosed.  The left ventricular systolic function is normal.  LV end diastolic  pressure is normal.  The left ventricular ejection fraction is 55-65% by visual estimate.   1. Single vessel obstructive CAD. Patient has complex ostial and proximal to mid LAD disease that involves the origin of the ramus intermediate and first diagonal respectively. 2. Normal LV function 3. Normal LVEDP  Plan: given complexity of lesions with ostial and bifurcation LAD disease I would recommend consideration for CABG.  Patient Profile     72 y.o. female with a hx of HTN, hypothyroidism, arthritis, obesity, dipping tobacco use, and family history of CAD, presented with chest pain.   Assessment & Plan    1. Unstable angina: chest pain/dyspnea on exertion concerning for ACS.  - Trop 0.03>0.07 - EKG non-ischemic - BNP 55 - Started on heparin and NTG paste - LHC revealed single vessel obstructive CAD. Patient has complex ostial and proximal to mid LAD disease that involves the origin of the ramus intermediate and first diagonal respectively. Due to complex anatomy, patient recommended for CABG - CT Surgery consulted - await recommendations - Will check echocardiogram  - Continue ASA, atorvastatin  2. HTN: previously on losartan 25mg  daily.  - Will continue to hold losartan given mild AKI - Will start low dose carvedilol for BP control in the perioperative setting.   3. Dyslipidemia: Tcholesterol 129, LDL 86, HDL 31, triglycerides 62 - Continue atorvastatin 80mg  - will plan to recheck lipids in 6wks  4. Hypothyroidism: TSH wnl - Continue levothyroxine  5. Mild hyperglycemia: - Will check HgbA1C   6. AKI: Cr 0.83>1.02 - Will give 500cc NS bolus - Continue to monitor Cr closely  For questions or updates, please contact [place] Please consult [email or web address] for contact info under Cardiology/STEMI.      Signed, Abigail Butts, PA-C  07/14/2017, 7:57 AM   [phone]  History and all data above reviewed.  Patient examined.  I agree with the findings as above.   No  further chest pain.  No SOB.  The patient exam reveals COR:RRR  ,  Lungs: Clear  ,  Abd: Positive bowel sounds, no rebound no guarding, Ext No edema  .  All available labs, radiology testing, previous records reviewed. Agree with documented assessment and plan. CAD:  Cath films  reviewed.  Needs CABG.  Awaiting consult.  Continue therapy as above.  Echo, carotid, PFTs pending.   Jeneen Rinks Yanil Dawe  11:42 AM  07/14/2017

## 2017-07-14 NOTE — Anesthesia Preprocedure Evaluation (Signed)
Anesthesia Evaluation  Patient identified by MRN, date of birth, ID band Patient awake    Reviewed: Allergy & Precautions, NPO status , Patient's Chart, lab work & pertinent test results  Airway Mallampati: II  TM Distance: >3 FB     Dental  (+) Teeth Intact   Pulmonary neg pulmonary ROS,    breath sounds clear to auscultation       Cardiovascular hypertension, + angina with exertion + CAD   Rhythm:Regular Rate:Normal     Neuro/Psych    GI/Hepatic   Endo/Other  Hypothyroidism   Renal/GU      Musculoskeletal  (+) Arthritis ,   Abdominal   Peds  Hematology   Anesthesia Other Findings Meniere's disease  Reproductive/Obstetrics                             Anesthesia Physical  Anesthesia Plan  ASA: IV  Anesthesia Plan: General   Post-op Pain Management:    Induction: Intravenous  PONV Risk Score and Plan: 3 and Treatment may vary due to age or medical condition, Ondansetron and Dexamethasone  Airway Management Planned: Oral ETT  Additional Equipment: Arterial line, CVP, TEE, PA Cath and Ultrasound Guidance Line Placement  Intra-op Plan:   Post-operative Plan: Post-operative intubation/ventilation  Informed Consent: I have reviewed the patients History and Physical, chart, labs and discussed the procedure including the risks, benefits and alternatives for the proposed anesthesia with the patient or authorized representative who has indicated his/her understanding and acceptance.   Dental advisory given  Plan Discussed with: CRNA  Anesthesia Plan Comments:         Anesthesia Quick Evaluation

## 2017-07-14 NOTE — Progress Notes (Signed)
[id number] Discussed with pt the importance of mobility and IS after surgery. Gave pt IS and discussed with pt how to use. Pt had some difficulty with it but able to get to 750 ml correctly. Discussed sternal precautions and demonstrated how to get up and down without use of arms.  Discussed with pt that she will need someone with her 24/7 first week home after surgery. Stated she has family that will be able to stay with her. Gave [facility] booklet and care guide. Graylon Good RN BSN 07/14/2017 11:19 AM

## 2017-07-15 ENCOUNTER — Inpatient Hospital Stay (HOSPITAL_COMMUNITY)
Admit: 2017-07-15 | Discharge: 2017-07-15 | Disposition: A | Discharge status: Home / Self Care | Payer: Medicare HMO | Attending: Thoracic Surgery (Cardiothoracic Vascular Surgery) | Admitting: Thoracic Surgery (Cardiothoracic Vascular Surgery)

## 2017-07-15 ENCOUNTER — Inpatient Hospital Stay (HOSPITAL_COMMUNITY)
Admission: EM | Disposition: A | Discharge status: Home Health | Payer: Self-pay | Source: Home / Self Care | Attending: Thoracic Surgery (Cardiothoracic Vascular Surgery)

## 2017-07-15 ENCOUNTER — Inpatient Hospital Stay (HOSPITAL_COMMUNITY): Payer: Medicare HMO

## 2017-07-15 ENCOUNTER — Inpatient Hospital Stay (HOSPITAL_COMMUNITY): Payer: Medicare HMO | Admitting: Certified Registered Nurse Anesthetist

## 2017-07-15 DIAGNOSIS — Z951 Presence of aortocoronary bypass graft: Secondary | ICD-10-CM

## 2017-07-15 DIAGNOSIS — I2510 Atherosclerotic heart disease of native coronary artery without angina pectoris: Secondary | ICD-10-CM | POA: Diagnosis present

## 2017-07-15 HISTORY — PX: CORONARY ARTERY BYPASS GRAFT: SHX141

## 2017-07-15 HISTORY — PX: TEE WITHOUT CARDIOVERSION: SHX5443

## 2017-07-15 LAB — POCT I-STAT, CHEM 8
BUN: 11 mg/dL (ref 6–20)
BUN: 10 mg/dL (ref 6–20)
BUN: 9 mg/dL (ref 6–20)
BUN: 10 mg/dL (ref 6–20)
BUN: 9 mg/dL (ref 6–20)
BUN: 7 mg/dL (ref 6–20)
CALCIUM ION: 1.24 mmol/L (ref 1.15–1.40)
CALCIUM ION: 1.01 mmol/L — AB (ref 1.15–1.40)
CHLORIDE: 102 mmol/L (ref 101–111)
CHLORIDE: 106 mmol/L (ref 101–111)
CREATININE: 0.7 mg/dL (ref 0.44–1.00)
CREATININE: 0.6 mg/dL (ref 0.44–1.00)
CREATININE: 0.5 mg/dL (ref 0.44–1.00)
CREATININE: 0.6 mg/dL (ref 0.44–1.00)
Calcium, Ion: 1.18 mmol/L (ref 1.15–1.40)
Calcium, Ion: 1.13 mmol/L — ABNORMAL LOW (ref 1.15–1.40)
Calcium, Ion: 1.1 mmol/L — ABNORMAL LOW (ref 1.15–1.40)
Calcium, Ion: 1.19 mmol/L (ref 1.15–1.40)
Chloride: 106 mmol/L (ref 101–111)
Chloride: 105 mmol/L (ref 101–111)
Chloride: 105 mmol/L (ref 101–111)
Chloride: 107 mmol/L (ref 101–111)
Creatinine, Ser: 0.5 mg/dL (ref 0.44–1.00)
Creatinine, Ser: 0.6 mg/dL (ref 0.44–1.00)
GLUCOSE: 106 mg/dL — AB (ref 65–99)
GLUCOSE: 91 mg/dL (ref 65–99)
GLUCOSE: 140 mg/dL — AB (ref 65–99)
Glucose, Bld: 103 mg/dL — ABNORMAL HIGH (ref 65–99)
Glucose, Bld: 114 mg/dL — ABNORMAL HIGH (ref 65–99)
Glucose, Bld: 113 mg/dL — ABNORMAL HIGH (ref 65–99)
HCT: 26 % — ABNORMAL LOW (ref 36.0–46.0)
HCT: 25 % — ABNORMAL LOW (ref 36.0–46.0)
HEMATOCRIT: 34 % — AB (ref 36.0–46.0)
HEMATOCRIT: 32 % — AB (ref 36.0–46.0)
HEMATOCRIT: 26 % — AB (ref 36.0–46.0)
HEMATOCRIT: 32 % — AB (ref 36.0–46.0)
HEMOGLOBIN: 8.5 g/dL — AB (ref 12.0–15.0)
HEMOGLOBIN: 10.9 g/dL — AB (ref 12.0–15.0)
Hemoglobin: 11.6 g/dL — ABNORMAL LOW (ref 12.0–15.0)
Hemoglobin: 10.9 g/dL — ABNORMAL LOW (ref 12.0–15.0)
Hemoglobin: 8.8 g/dL — ABNORMAL LOW (ref 12.0–15.0)
Hemoglobin: 8.8 g/dL — ABNORMAL LOW (ref 12.0–15.0)
POTASSIUM: 5.2 mmol/L — AB (ref 3.5–5.1)
Potassium: 3.9 mmol/L (ref 3.5–5.1)
Potassium: 3.7 mmol/L (ref 3.5–5.1)
Potassium: 3.9 mmol/L (ref 3.5–5.1)
Potassium: 4.8 mmol/L (ref 3.5–5.1)
Potassium: 4 mmol/L (ref 3.5–5.1)
SODIUM: 144 mmol/L (ref 135–145)
Sodium: 144 mmol/L (ref 135–145)
Sodium: 142 mmol/L (ref 135–145)
Sodium: 142 mmol/L (ref 135–145)
Sodium: 142 mmol/L (ref 135–145)
Sodium: 141 mmol/L (ref 135–145)
TCO2: 29 mmol/L (ref 22–32)
TCO2: 24 mmol/L (ref 22–32)
TCO2: 26 mmol/L (ref 22–32)
TCO2: 26 mmol/L (ref 22–32)
TCO2: 25 mmol/L (ref 22–32)
TCO2: 24 mmol/L (ref 22–32)

## 2017-07-15 LAB — POCT I-STAT 3, ART BLOOD GAS (G3+)
Acid-Base Excess: 1 mmol/L (ref 0.0–2.0)
Acid-base deficit: 5 mmol/L — ABNORMAL HIGH (ref 0.0–2.0)
Acid-base deficit: 4 mmol/L — ABNORMAL HIGH (ref 0.0–2.0)
Acid-base deficit: 5 mmol/L — ABNORMAL HIGH (ref 0.0–2.0)
BICARBONATE: 24.5 mmol/L (ref 20.0–28.0)
BICARBONATE: 21 mmol/L (ref 20.0–28.0)
BICARBONATE: 22.9 mmol/L (ref 20.0–28.0)
Bicarbonate: 21.1 mmol/L (ref 20.0–28.0)
O2 SAT: 100 %
O2 SAT: 96 %
O2 SAT: 96 %
O2 Saturation: 98 %
PCO2 ART: 34.7 mmHg (ref 32.0–48.0)
PCO2 ART: 37.5 mmHg (ref 32.0–48.0)
PCO2 ART: 48.8 mmHg — AB (ref 32.0–48.0)
PH ART: 7.35 (ref 7.350–7.450)
PO2 ART: 422 mmHg — AB (ref 83.0–108.0)
Patient temperature: 35.6
TCO2: 26 mmol/L (ref 22–32)
TCO2: 22 mmol/L (ref 22–32)
TCO2: 24 mmol/L (ref 22–32)
TCO2: 22 mmol/L (ref 22–32)
pCO2 arterial: 44.4 mmHg (ref 32.0–48.0)
pH, Arterial: 7.458 — ABNORMAL HIGH (ref 7.350–7.450)
pH, Arterial: 7.281 — ABNORMAL LOW (ref 7.350–7.450)
pH, Arterial: 7.292 — ABNORMAL LOW (ref 7.350–7.450)
pO2, Arterial: 83 mmHg (ref 83.0–108.0)
pO2, Arterial: 114 mmHg — ABNORMAL HIGH (ref 83.0–108.0)
pO2, Arterial: 97 mmHg (ref 83.0–108.0)

## 2017-07-15 LAB — APTT: APTT: 36 s (ref 24–36)

## 2017-07-15 LAB — CBC
HCT: 36 % (ref 36.0–46.0)
HEMATOCRIT: 32 % — AB (ref 36.0–46.0)
HEMATOCRIT: 31.4 % — AB (ref 36.0–46.0)
HEMOGLOBIN: 11.3 g/dL — AB (ref 12.0–15.0)
HEMOGLOBIN: 11.3 g/dL — AB (ref 12.0–15.0)
Hemoglobin: 13 g/dL (ref 12.0–15.0)
MCH: 30.5 pg (ref 26.0–34.0)
MCH: 30.1 pg (ref 26.0–34.0)
MCH: 30.7 pg (ref 26.0–34.0)
MCHC: 36.1 g/dL — ABNORMAL HIGH (ref 30.0–36.0)
MCHC: 35.3 g/dL (ref 30.0–36.0)
MCHC: 36 g/dL (ref 30.0–36.0)
MCV: 84.5 fL (ref 78.0–100.0)
MCV: 85.1 fL (ref 78.0–100.0)
MCV: 85.3 fL (ref 78.0–100.0)
PLATELETS: 239 10*3/uL (ref 150–400)
Platelets: 127 10*3/uL — ABNORMAL LOW (ref 150–400)
Platelets: 133 10*3/uL — ABNORMAL LOW (ref 150–400)
RBC: 4.26 MIL/uL (ref 3.87–5.11)
RBC: 3.76 MIL/uL — ABNORMAL LOW (ref 3.87–5.11)
RBC: 3.68 MIL/uL — ABNORMAL LOW (ref 3.87–5.11)
RDW: 13.6 % (ref 11.5–15.5)
RDW: 14 % (ref 11.5–15.5)
RDW: 13.6 % (ref 11.5–15.5)
WBC: 9.8 10*3/uL (ref 4.0–10.5)
WBC: 18.9 10*3/uL — AB (ref 4.0–10.5)
WBC: 16.2 10*3/uL — ABNORMAL HIGH (ref 4.0–10.5)

## 2017-07-15 LAB — GLUCOSE, CAPILLARY
GLUCOSE-CAPILLARY: 109 mg/dL — AB (ref 65–99)
GLUCOSE-CAPILLARY: 118 mg/dL — AB (ref 65–99)
GLUCOSE-CAPILLARY: 124 mg/dL — AB (ref 65–99)
GLUCOSE-CAPILLARY: 141 mg/dL — AB (ref 65–99)
GLUCOSE-CAPILLARY: 142 mg/dL — AB (ref 65–99)
GLUCOSE-CAPILLARY: 152 mg/dL — AB (ref 65–99)
GLUCOSE-CAPILLARY: 97 mg/dL (ref 65–99)
Glucose-Capillary: 110 mg/dL — ABNORMAL HIGH (ref 65–99)
Glucose-Capillary: 112 mg/dL — ABNORMAL HIGH (ref 65–99)

## 2017-07-15 LAB — BASIC METABOLIC PANEL
ANION GAP: 9 (ref 5–15)
BUN: 12 mg/dL (ref 6–20)
CALCIUM: 8.8 mg/dL — AB (ref 8.9–10.3)
CO2: 23 mmol/L (ref 22–32)
Chloride: 111 mmol/L (ref 101–111)
Creatinine, Ser: 0.81 mg/dL (ref 0.44–1.00)
Glucose, Bld: 116 mg/dL — ABNORMAL HIGH (ref 65–99)
Potassium: 3.9 mmol/L (ref 3.5–5.1)
Sodium: 143 mmol/L (ref 135–145)

## 2017-07-15 LAB — PLATELET COUNT: Platelets: 141 10*3/uL — ABNORMAL LOW (ref 150–400)

## 2017-07-15 LAB — POCT I-STAT 4, (NA,K, GLUC, HGB,HCT)
Glucose, Bld: 115 mg/dL — ABNORMAL HIGH (ref 65–99)
HCT: 33 % — ABNORMAL LOW (ref 36.0–46.0)
Hemoglobin: 11.2 g/dL — ABNORMAL LOW (ref 12.0–15.0)
POTASSIUM: 3.8 mmol/L (ref 3.5–5.1)
Sodium: 144 mmol/L (ref 135–145)

## 2017-07-15 LAB — MAGNESIUM: Magnesium: 3.1 mg/dL — ABNORMAL HIGH (ref 1.7–2.4)

## 2017-07-15 LAB — CREATININE, SERUM
Creatinine, Ser: 0.79 mg/dL (ref 0.44–1.00)
GFR calc Af Amer: >60 mL/min (ref >60)
GFR calc non Af Amer: >60 mL/min (ref >60)

## 2017-07-15 LAB — PROTIME-INR
INR: 1.33
PROTHROMBIN TIME: 16.4 s — AB (ref 11.4–15.2)

## 2017-07-15 LAB — HEPARIN LEVEL (UNFRACTIONATED): HEPARIN UNFRACTIONATED: 0.43 [IU]/mL (ref 0.30–0.70)

## 2017-07-15 LAB — HEMOGLOBIN AND HEMATOCRIT, BLOOD
HCT: 25.1 % — ABNORMAL LOW (ref 36.0–46.0)
HEMOGLOBIN: 9 g/dL — AB (ref 12.0–15.0)

## 2017-07-15 SURGERY — CORONARY ARTERY BYPASS GRAFTING (CABG)
Anesthesia: General | Site: Chest

## 2017-07-15 MED ORDER — POTASSIUM CHLORIDE 10 MEQ/50ML IV SOLN
10.0000 meq | INTRAVENOUS | Status: AC
  Administered 2017-07-15 (×3): 10 meq via INTRAVENOUS
  Filled 2017-07-15: qty 50

## 2017-07-15 MED ORDER — SODIUM CHLORIDE 0.9% FLUSH: 10.0000 mL | INTRAVENOUS | Status: DC | PRN

## 2017-07-15 MED ORDER — SODIUM CHLORIDE 0.9 % IV SOLN: INTRAVENOUS | Status: DC

## 2017-07-15 MED ORDER — SODIUM CHLORIDE 0.9% FLUSH
3.0000 mL | Freq: Two times a day (BID) | INTRAVENOUS | Status: DC
  Administered 2017-07-16: 3 mL via INTRAVENOUS
  Administered 2017-07-16: 10 mL via INTRAVENOUS
  Administered 2017-07-17 – 2017-07-18 (×2): 3 mL via INTRAVENOUS

## 2017-07-15 MED ORDER — NITROGLYCERIN IN D5W 200-5 MCG/ML-% IV SOLN: 0.0000 ug/min | INTRAVENOUS | Status: DC

## 2017-07-15 MED ORDER — MAGNESIUM SULFATE 4 GM/100ML IV SOLN
4.0000 g | Freq: Once | INTRAVENOUS | Status: AC
  Administered 2017-07-15: 4 g via INTRAVENOUS
  Filled 2017-07-15: qty 100

## 2017-07-15 MED ORDER — PROPOFOL 10 MG/ML IV BOLUS
INTRAVENOUS | Status: DC | PRN
  Administered 2017-07-15: 40 mg via INTRAVENOUS

## 2017-07-15 MED ORDER — ORAL CARE MOUTH RINSE
15.0000 mL | Freq: Four times a day (QID) | OROMUCOSAL | Status: DC
  Administered 2017-07-15 – 2017-07-16 (×2): 15 mL via OROMUCOSAL

## 2017-07-15 MED ORDER — METOPROLOL TARTRATE 12.5 MG HALF TABLET: 12.5000 mg | ORAL_TABLET | Freq: Two times a day (BID) | ORAL | Status: DC

## 2017-07-15 MED ORDER — DEXTROSE 5 % IV SOLN
INTRAVENOUS | Status: DC | PRN
  Administered 2017-07-15: 750 mg via INTRAVENOUS

## 2017-07-15 MED ORDER — ROCURONIUM BROMIDE 10 MG/ML (PF) SYRINGE
PREFILLED_SYRINGE | INTRAVENOUS | Status: AC
  Filled 2017-07-15: qty 10

## 2017-07-15 MED ORDER — TRAMADOL HCL 50 MG PO TABS
50.0000 mg | ORAL_TABLET | ORAL | Status: DC | PRN
  Administered 2017-07-16 – 2017-07-18 (×3): 50 mg via ORAL
  Filled 2017-07-15 (×3): qty 1

## 2017-07-15 MED ORDER — ROCURONIUM BROMIDE 10 MG/ML (PF) SYRINGE
PREFILLED_SYRINGE | INTRAVENOUS | Status: DC | PRN
  Administered 2017-07-15: 50 mg via INTRAVENOUS
  Administered 2017-07-15: 100 mg via INTRAVENOUS
  Administered 2017-07-15: 50 mg via INTRAVENOUS

## 2017-07-15 MED ORDER — BISACODYL 5 MG PO TBEC
10.0000 mg | DELAYED_RELEASE_TABLET | Freq: Every day | ORAL | Status: DC
  Administered 2017-07-16 – 2017-07-18 (×3): 10 mg via ORAL
  Filled 2017-07-15 (×3): qty 2

## 2017-07-15 MED ORDER — PHENYLEPHRINE 40 MCG/ML (10ML) SYRINGE FOR IV PUSH (FOR BLOOD PRESSURE SUPPORT)
PREFILLED_SYRINGE | INTRAVENOUS | Status: DC | PRN
  Administered 2017-07-15: 40 ug via INTRAVENOUS
  Administered 2017-07-15: 20 ug via INTRAVENOUS

## 2017-07-15 MED ORDER — MIDAZOLAM HCL 2 MG/2ML IJ SOLN: 2.0000 mg | INTRAMUSCULAR | Status: DC | PRN

## 2017-07-15 MED ORDER — SODIUM CHLORIDE 0.9% FLUSH
10.0000 mL | Freq: Two times a day (BID) | INTRAVENOUS | Status: DC
  Administered 2017-07-16 – 2017-07-18 (×4): 10 mL

## 2017-07-15 MED ORDER — DEXMEDETOMIDINE HCL IN NACL 400 MCG/100ML IV SOLN
0.0000 ug/kg/h | INTRAVENOUS | Status: DC
  Administered 2017-07-15: 0.5 ug/kg/h via INTRAVENOUS
  Filled 2017-07-15: qty 100

## 2017-07-15 MED ORDER — BISACODYL 10 MG RE SUPP: 10.0000 mg | Freq: Every day | RECTAL | Status: DC

## 2017-07-15 MED ORDER — PHENYLEPHRINE HCL 10 MG/ML IJ SOLN
0.0000 ug/min | INTRAMUSCULAR | Status: DC
  Filled 2017-07-15: qty 2

## 2017-07-15 MED ORDER — PANTOPRAZOLE SODIUM 40 MG PO TBEC
40.0000 mg | DELAYED_RELEASE_TABLET | Freq: Every day | ORAL | Status: DC
  Administered 2017-07-17 – 2017-07-18 (×2): 40 mg via ORAL
  Filled 2017-07-15 (×2): qty 1

## 2017-07-15 MED ORDER — ACETAMINOPHEN 160 MG/5ML PO SOLN: 650.0000 mg | Freq: Once | ORAL | Status: AC

## 2017-07-15 MED ORDER — HEPARIN SODIUM (PORCINE) 1000 UNIT/ML IJ SOLN
INTRAMUSCULAR | Status: DC | PRN
  Administered 2017-07-15: 2000 [IU] via INTRAVENOUS
  Administered 2017-07-15: 26000 [IU] via INTRAVENOUS
  Administered 2017-07-15: 5000 [IU] via INTRAVENOUS

## 2017-07-15 MED ORDER — HEPARIN SODIUM (PORCINE) 1000 UNIT/ML IJ SOLN
INTRAMUSCULAR | Status: AC
  Filled 2017-07-15: qty 1

## 2017-07-15 MED ORDER — ONDANSETRON HCL 4 MG/2ML IJ SOLN
4.0000 mg | Freq: Four times a day (QID) | INTRAMUSCULAR | Status: DC | PRN
  Administered 2017-07-16: 4 mg via INTRAVENOUS
  Filled 2017-07-15: qty 2

## 2017-07-15 MED ORDER — MORPHINE SULFATE (PF) 4 MG/ML IV SOLN
2.0000 mg | INTRAVENOUS | Status: DC | PRN
  Administered 2017-07-16: 4 mg via INTRAVENOUS
  Administered 2017-07-16 (×2): 2 mg via INTRAVENOUS
  Administered 2017-07-16: 4 mg via INTRAVENOUS
  Filled 2017-07-15 (×4): qty 1

## 2017-07-15 MED ORDER — PROPOFOL 10 MG/ML IV BOLUS
INTRAVENOUS | Status: AC
  Filled 2017-07-15: qty 20

## 2017-07-15 MED ORDER — METOPROLOL TARTRATE 5 MG/5ML IV SOLN
2.5000 mg | INTRAVENOUS | Status: DC | PRN
  Administered 2017-07-16: 5 mg via INTRAVENOUS
  Filled 2017-07-15 (×2): qty 5

## 2017-07-15 MED ORDER — LACTATED RINGERS IV SOLN: INTRAVENOUS | Status: DC

## 2017-07-15 MED ORDER — 0.9 % SODIUM CHLORIDE (POUR BTL) OPTIME
TOPICAL | Status: DC | PRN
  Administered 2017-07-15: 5000 mL
  Administered 2017-07-15: 1000 mL

## 2017-07-15 MED ORDER — FENTANYL CITRATE (PF) 250 MCG/5ML IJ SOLN
INTRAMUSCULAR | Status: DC | PRN
  Administered 2017-07-15 (×5): 250 ug via INTRAVENOUS
  Administered 2017-07-15: 100 ug via INTRAVENOUS

## 2017-07-15 MED ORDER — FENTANYL CITRATE (PF) 250 MCG/5ML IJ SOLN
INTRAMUSCULAR | Status: AC
  Filled 2017-07-15: qty 5

## 2017-07-15 MED ORDER — ACETAMINOPHEN 500 MG PO TABS
1000.0000 mg | ORAL_TABLET | Freq: Four times a day (QID) | ORAL | Status: DC
  Administered 2017-07-16 – 2017-07-18 (×9): 1000 mg via ORAL
  Filled 2017-07-15 (×9): qty 2

## 2017-07-15 MED ORDER — POTASSIUM CHLORIDE 10 MEQ/50ML IV SOLN
10.0000 meq | Freq: Once | INTRAVENOUS | Status: AC
  Administered 2017-07-15: 10 meq via INTRAVENOUS

## 2017-07-15 MED ORDER — FAMOTIDINE IN NACL 20-0.9 MG/50ML-% IV SOLN
20.0000 mg | Freq: Two times a day (BID) | INTRAVENOUS | Status: AC
  Administered 2017-07-15 (×2): 20 mg via INTRAVENOUS
  Filled 2017-07-15: qty 50

## 2017-07-15 MED ORDER — DOCUSATE SODIUM 100 MG PO CAPS
200.0000 mg | ORAL_CAPSULE | Freq: Every day | ORAL | Status: DC
  Administered 2017-07-16 – 2017-07-18 (×3): 200 mg via ORAL
  Filled 2017-07-15 (×3): qty 2

## 2017-07-15 MED ORDER — SODIUM CHLORIDE 0.45 % IV SOLN: INTRAVENOUS | Status: DC | PRN

## 2017-07-15 MED ORDER — ALBUMIN HUMAN 5 % IV SOLN
250.0000 mL | INTRAVENOUS | Status: AC | PRN
  Administered 2017-07-15 (×4): 250 mL via INTRAVENOUS
  Filled 2017-07-15 (×2): qty 250

## 2017-07-15 MED ORDER — ACETAMINOPHEN 160 MG/5ML PO SOLN
1000.0000 mg | Freq: Four times a day (QID) | ORAL | Status: DC
  Administered 2017-07-15: 1000 mg
  Filled 2017-07-15: qty 40.6

## 2017-07-15 MED ORDER — CHLORHEXIDINE GLUCONATE CLOTH 2 % EX PADS
6.0000 | MEDICATED_PAD | Freq: Every day | CUTANEOUS | Status: DC
  Administered 2017-07-15 – 2017-07-18 (×4): 6 via TOPICAL

## 2017-07-15 MED ORDER — PROTAMINE SULFATE 10 MG/ML IV SOLN
INTRAVENOUS | Status: AC
  Filled 2017-07-15: qty 25

## 2017-07-15 MED ORDER — ASPIRIN 81 MG PO CHEW
324.0000 mg | CHEWABLE_TABLET | Freq: Every day | ORAL | Status: DC
  Filled 2017-07-15: qty 4

## 2017-07-15 MED ORDER — MIDAZOLAM HCL 5 MG/5ML IJ SOLN
INTRAMUSCULAR | Status: DC | PRN
  Administered 2017-07-15: 2 mg via INTRAVENOUS
  Administered 2017-07-15: 3 mg via INTRAVENOUS
  Administered 2017-07-15: 2 mg via INTRAVENOUS
  Administered 2017-07-15: 3 mg via INTRAVENOUS

## 2017-07-15 MED ORDER — CEFUROXIME SODIUM 1.5 G IV SOLR
1.5000 g | Freq: Two times a day (BID) | INTRAVENOUS | Status: AC
  Administered 2017-07-15 – 2017-07-17 (×4): 1.5 g via INTRAVENOUS
  Filled 2017-07-15 (×4): qty 1.5

## 2017-07-15 MED ORDER — FENTANYL CITRATE (PF) 250 MCG/5ML IJ SOLN
INTRAMUSCULAR | Status: AC
  Filled 2017-07-15: qty 25

## 2017-07-15 MED ORDER — LACTATED RINGERS IV SOLN
INTRAVENOUS | Status: DC | PRN
  Administered 2017-07-15 (×4): via INTRAVENOUS

## 2017-07-15 MED ORDER — SODIUM CHLORIDE 0.9 % IJ SOLN
OROMUCOSAL | Status: DC | PRN
  Administered 2017-07-15 (×3): 4 mL via TOPICAL

## 2017-07-15 MED ORDER — MIDAZOLAM HCL 10 MG/2ML IJ SOLN
INTRAMUSCULAR | Status: AC
  Filled 2017-07-15: qty 2

## 2017-07-15 MED ORDER — HEMOSTATIC AGENTS (NO CHARGE) OPTIME
TOPICAL | Status: DC | PRN
  Administered 2017-07-15: 1 via TOPICAL

## 2017-07-15 MED ORDER — ACETAMINOPHEN 650 MG RE SUPP
650.0000 mg | Freq: Once | RECTAL | Status: AC
  Administered 2017-07-15: 650 mg via RECTAL

## 2017-07-15 MED ORDER — LACTATED RINGERS IV SOLN: 500.0000 mL | Freq: Once | INTRAVENOUS | Status: DC | PRN

## 2017-07-15 MED ORDER — ASPIRIN EC 325 MG PO TBEC
325.0000 mg | DELAYED_RELEASE_TABLET | Freq: Every day | ORAL | Status: DC
  Administered 2017-07-16 – 2017-07-18 (×3): 325 mg via ORAL
  Filled 2017-07-15 (×3): qty 1

## 2017-07-15 MED ORDER — PROTAMINE SULFATE 10 MG/ML IV SOLN
INTRAVENOUS | Status: DC | PRN
  Administered 2017-07-15: 280 mg via INTRAVENOUS

## 2017-07-15 MED ORDER — CHLORHEXIDINE GLUCONATE 0.12 % MT SOLN
15.0000 mL | OROMUCOSAL | Status: AC
  Administered 2017-07-15: 15 mL via OROMUCOSAL
  Filled 2017-07-15: qty 15

## 2017-07-15 MED ORDER — CHLORHEXIDINE GLUCONATE 0.12% ORAL RINSE (MEDLINE KIT)
15.0000 mL | Freq: Two times a day (BID) | OROMUCOSAL | Status: DC
  Administered 2017-07-15: 15 mL via OROMUCOSAL

## 2017-07-15 MED ORDER — VANCOMYCIN HCL IN DEXTROSE 1-5 GM/200ML-% IV SOLN
1000.0000 mg | Freq: Once | INTRAVENOUS | Status: AC
  Administered 2017-07-15: 1000 mg via INTRAVENOUS
  Filled 2017-07-15: qty 200

## 2017-07-15 MED ORDER — INSULIN REGULAR BOLUS VIA INFUSION
0.0000 [IU] | Freq: Three times a day (TID) | INTRAVENOUS | Status: DC
  Filled 2017-07-15: qty 10

## 2017-07-15 MED ORDER — METOPROLOL TARTRATE 25 MG/10 ML ORAL SUSPENSION: 12.5000 mg | Freq: Two times a day (BID) | ORAL | Status: DC

## 2017-07-15 MED ORDER — OXYCODONE HCL 5 MG PO TABS
5.0000 mg | ORAL_TABLET | ORAL | Status: DC | PRN
  Administered 2017-07-16 – 2017-07-17 (×5): 10 mg via ORAL
  Filled 2017-07-15 (×5): qty 2

## 2017-07-15 MED ORDER — SODIUM CHLORIDE 0.9 % IV SOLN: 250.0000 mL | INTRAVENOUS | Status: DC

## 2017-07-15 MED ORDER — ROCURONIUM BROMIDE 100 MG/10ML IV SOLN: INTRAVENOUS | Status: DC | PRN

## 2017-07-15 MED ORDER — PHENYLEPHRINE 40 MCG/ML (10ML) SYRINGE FOR IV PUSH (FOR BLOOD PRESSURE SUPPORT)
PREFILLED_SYRINGE | INTRAVENOUS | Status: AC
  Filled 2017-07-15: qty 10

## 2017-07-15 MED ORDER — MORPHINE SULFATE (PF) 4 MG/ML IV SOLN: 1.0000 mg | INTRAVENOUS | Status: DC | PRN

## 2017-07-15 MED ORDER — PROTAMINE SULFATE 10 MG/ML IV SOLN
INTRAVENOUS | Status: AC
  Filled 2017-07-15: qty 5

## 2017-07-15 MED ORDER — SODIUM CHLORIDE 0.9% FLUSH: 3.0000 mL | INTRAVENOUS | Status: DC | PRN

## 2017-07-15 SURGICAL SUPPLY — 89 items
BAG DECANTER FOR FLEXI CONT (MISCELLANEOUS) ×4 IMPLANT
BANDAGE ACE 4X5 VEL STRL LF (GAUZE/BANDAGES/DRESSINGS) ×4 IMPLANT
BANDAGE ACE 6X5 VEL STRL LF (GAUZE/BANDAGES/DRESSINGS) ×4 IMPLANT
BASKET HEART  (ORDER IN 25'S) (MISCELLANEOUS) ×1
BASKET HEART (ORDER IN 25'S) (MISCELLANEOUS) ×1
BASKET HEART (ORDER IN 25S) (MISCELLANEOUS) ×2 IMPLANT
BLADE STERNUM SYSTEM 6 (BLADE) ×4 IMPLANT
BLADE SURG 11 STRL SS (BLADE) ×4 IMPLANT
BNDG GAUZE ELAST 4 BULKY (GAUZE/BANDAGES/DRESSINGS) ×4 IMPLANT
CANISTER SUCT 3000ML PPV (MISCELLANEOUS) ×4 IMPLANT
CANNULA EZ GLIDE AORTIC 21FR (CANNULA) ×4 IMPLANT
CATH CPB KIT HENDRICKSON (MISCELLANEOUS) ×4 IMPLANT
CATH ROBINSON RED A/P 18FR (CATHETERS) ×4 IMPLANT
CATH THORACIC 36FR (CATHETERS) ×4 IMPLANT
CATH THORACIC 36FR RT ANG (CATHETERS) ×4 IMPLANT
CLIP VESOCCLUDE MED 24/CT (CLIP) IMPLANT
CLIP VESOCCLUDE SM WIDE 24/CT (CLIP) ×8 IMPLANT
CRADLE DONUT ADULT HEAD (MISCELLANEOUS) ×4 IMPLANT
DERMABOND ADVANCED (GAUZE/BANDAGES/DRESSINGS) ×2
DERMABOND ADVANCED .7 DNX12 (GAUZE/BANDAGES/DRESSINGS) ×2 IMPLANT
DRAPE CARDIOVASCULAR INCISE (DRAPES) ×2
DRAPE SLUSH/WARMER DISC (DRAPES) ×4 IMPLANT
DRAPE SRG 135X102X78XABS (DRAPES) ×2 IMPLANT
DRSG AQUACEL AG ADV 3.5X14 (GAUZE/BANDAGES/DRESSINGS) ×4 IMPLANT
DRSG COVADERM 4X14 (GAUZE/BANDAGES/DRESSINGS) IMPLANT
ELECT REM PT RETURN 9FT ADLT (ELECTROSURGICAL) ×8
ELECTRODE REM PT RTRN 9FT ADLT (ELECTROSURGICAL) ×4 IMPLANT
FELT TEFLON 1X6 (MISCELLANEOUS) ×4 IMPLANT
GAUZE SPONGE 4X4 12PLY STRL (GAUZE/BANDAGES/DRESSINGS) ×8 IMPLANT
GLOVE BIO SURGEON STRL SZ 6 (GLOVE) ×12 IMPLANT
GLOVE BIO SURGEON STRL SZ 6.5 (GLOVE) ×12 IMPLANT
GLOVE BIO SURGEONS STRL SZ 6.5 (GLOVE) ×4
GLOVE SURG SIGNA 7.5 PF LTX (GLOVE) ×12 IMPLANT
GOWN STRL REUS W/ TWL LRG LVL3 (GOWN DISPOSABLE) ×12 IMPLANT
GOWN STRL REUS W/ TWL XL LVL3 (GOWN DISPOSABLE) ×4 IMPLANT
GOWN STRL REUS W/TWL LRG LVL3 (GOWN DISPOSABLE) ×12
GOWN STRL REUS W/TWL XL LVL3 (GOWN DISPOSABLE) ×4
HEMOSTAT POWDER SURGIFOAM 1G (HEMOSTASIS) ×12 IMPLANT
HEMOSTAT SURGICEL 2X14 (HEMOSTASIS) ×4 IMPLANT
INSERT FOGARTY XLG (MISCELLANEOUS) ×4 IMPLANT
KIT BASIN OR (CUSTOM PROCEDURE TRAY) ×4 IMPLANT
KIT ROOM TURNOVER OR (KITS) ×4 IMPLANT
KIT SUCTION CATH 14FR (SUCTIONS) ×8 IMPLANT
KIT VASOVIEW HEMOPRO VH 3000 (KITS) ×4 IMPLANT
MARKER GRAFT CORONARY BYPASS (MISCELLANEOUS) ×12 IMPLANT
NS IRRIG 1000ML POUR BTL (IV SOLUTION) ×24 IMPLANT
PACK E OPEN HEART (SUTURE) ×4 IMPLANT
PACK OPEN HEART (CUSTOM PROCEDURE TRAY) ×4 IMPLANT
PAD ARMBOARD 7.5X6 YLW CONV (MISCELLANEOUS) ×8 IMPLANT
PAD ELECT DEFIB RADIOL ZOLL (MISCELLANEOUS) ×4 IMPLANT
PENCIL BUTTON HOLSTER BLD 10FT (ELECTRODE) ×4 IMPLANT
PUNCH AORTIC ROTATE  4.5MM 8IN (MISCELLANEOUS) ×4 IMPLANT
PUNCH AORTIC ROTATE 4.0MM (MISCELLANEOUS) IMPLANT
PUNCH AORTIC ROTATE 4.5MM 8IN (MISCELLANEOUS) IMPLANT
PUNCH AORTIC ROTATE 5MM 8IN (MISCELLANEOUS) IMPLANT
SET CARDIOPLEGIA MPS 5001102 (MISCELLANEOUS) ×4 IMPLANT
SPONGE LAP 18X18 X RAY DECT (DISPOSABLE) ×4 IMPLANT
SPONGE LAP 4X18 X RAY DECT (DISPOSABLE) ×8 IMPLANT
SUT BONE WAX W31G (SUTURE) ×4 IMPLANT
SUT MNCRL AB 4-0 PS2 18 (SUTURE) ×4 IMPLANT
SUT PROLENE 3 0 SH DA (SUTURE) ×4 IMPLANT
SUT PROLENE 4 0 RB 1 (SUTURE)
SUT PROLENE 4 0 SH DA (SUTURE) IMPLANT
SUT PROLENE 4-0 RB1 .5 CRCL 36 (SUTURE) IMPLANT
SUT PROLENE 6 0 C 1 30 (SUTURE) ×12 IMPLANT
SUT PROLENE 7 0 BV1 MDA (SUTURE) ×4 IMPLANT
SUT PROLENE 8 0 BV175 6 (SUTURE) ×4 IMPLANT
SUT STEEL 6MS V (SUTURE) ×4 IMPLANT
SUT STEEL STERNAL CCS#1 18IN (SUTURE) IMPLANT
SUT STEEL SZ 6 DBL 3X14 BALL (SUTURE) ×4 IMPLANT
SUT VIC AB 1 CTX 36 (SUTURE) ×4
SUT VIC AB 1 CTX36XBRD ANBCTR (SUTURE) ×4 IMPLANT
SUT VIC AB 2-0 CT1 27 (SUTURE) ×2
SUT VIC AB 2-0 CT1 TAPERPNT 27 (SUTURE) ×2 IMPLANT
SUT VIC AB 2-0 CTX 27 (SUTURE) IMPLANT
SUT VIC AB 3-0 SH 27 (SUTURE) ×6
SUT VIC AB 3-0 SH 27X BRD (SUTURE) ×6 IMPLANT
SUT VIC AB 3-0 X1 27 (SUTURE) IMPLANT
SUT VICRYL 4-0 PS2 18IN ABS (SUTURE) IMPLANT
SYSTEM SAHARA CHEST DRAIN ATS (WOUND CARE) ×4 IMPLANT
TAPE CLOTH SURG 4X10 WHT LF (GAUZE/BANDAGES/DRESSINGS) ×8 IMPLANT
TAPE PAPER 2X10 WHT MICROPORE (GAUZE/BANDAGES/DRESSINGS) ×4 IMPLANT
TOWEL GREEN STERILE (TOWEL DISPOSABLE) ×4 IMPLANT
TOWEL GREEN STERILE FF (TOWEL DISPOSABLE) ×4 IMPLANT
TRAY FOLEY SILVER 16FR TEMP (SET/KITS/TRAYS/PACK) ×4 IMPLANT
TUBE FEEDING 8FR 16IN STR KANG (MISCELLANEOUS) ×4 IMPLANT
TUBING INSUFFLATION (TUBING) ×4 IMPLANT
UNDERPAD 30X30 (UNDERPADS AND DIAPERS) ×4 IMPLANT
WATER STERILE IRR 1000ML POUR (IV SOLUTION) ×8 IMPLANT

## 2017-07-15 NOTE — Brief Op Note (Signed)
07/13/2017 - 07/15/2017  2:31 PM  PATIENT:  Gloria Mccoy  72 y.o. female  PRE-OPERATIVE DIAGNOSIS:  1 VESSEL CAD  POST-OPERATIVE DIAGNOSIS:  1 VESSEL CAD  PROCEDURE:  TRANSESOPHAGEAL ECHOCARDIOGRAM (TEE),  MEDIAN STERNOTOMY for  CORONARY ARTERY BYPASS GRAFTING (CABG) x 3  LIMA to LAD,   SVG to DIAGONAL 1,   SVG to RAMUS INTERMEDIATE RIGHT THIGH GREATER SAPHENOUS VEIN HARVESTED ENDOSCOPICALLY and LEFT INTERNAL MAMMARY ARTERY   SURGEON:  Surgeon(s) and Role:    Melrose Nakayama, MD - Primary  PHYSICIAN ASSISTANT: Lars Pinks PA-C  ASSISTANTS: Sharee Pimple RNFA   ANESTHESIA:   general  EBL:  800 mL   DRAINS: Chest tubes placed in the mediastinal and pleural spaces  COUNTS:  YES  DICTATION: .Dragon Dictation  PLAN OF CARE: Admit to inpatient   PATIENT DISPOSITION:  ICU - intubated and hemodynamically stable.   Delay start of Pharmacological VTE agent (>24hrs) due to surgical blood loss or risk of bleeding: yes  BASELINE WEIGHT: 89.9 kg  XC= 59 min CPB= 88 min Good targets and good conduits TEE- preserved LV function

## 2017-07-15 NOTE — Op Note (Signed)
NAMEJAISA, [name]  MEDICAL RECORD NO.:  [id number]  LOCATION:                                 FACILITY:  PHYSICIAN:  Revonda Standard. Roxan Hockey, M.D. DATE OF BIRTH:  DATE OF PROCEDURE:  07/14/2017 DATE OF DISCHARGE:                              OPERATIVE REPORT   PREOPERATIVE DIAGNOSIS:  Severe single-vessel coronary artery disease with unstable coronary syndrome.  POSTOPERATIVE DIAGNOSIS:  Severe single-vessel coronary artery disease with unstable coronary syndrome.  PROCEDURES:  Median sternotomy, extracorporeal circulation, Coronary artery bypass grafting x 3  Left internal mammary artery to left anterior descending,  Saphenous vein graft to first diagonal,  Saphenous vein graft to ramus intermedius, Endoscopic vein harvest of right thigh.  SURGEON:  Revonda Standard. Roxan Hockey, MD.  ASSISTANTLars Pinks, PA.  ANESTHESIA:  General.  FINDINGS:  Transesophageal echocardiography showed preserved left ventricular wall motion with no valvular pathology. Good quality Conduits. Diagonal fair quality. Ramus and LAD good quality targets.  CLINICAL NOTE:  Gloria Mccoy is a 72 year old woman who presented with an unstable coronary syndrome.  She had an elevated troponin.  At catheterization, she had severe single-vessel coronary artery disease with a complex stenosis in her ostial to proximal LAD involving the takeoff of the ramus intermedius and first diagonal branches.  She was referred for coronary artery bypass grafting.  The indications, risks, benefits, and alternatives were discussed in detail with the patient. She understood and accepted the risks and agreed to proceed.  OPERATIVE NOTE:  Gloria Mccoy was brought to the preoperative holding area on July 15, 2017.  Anesthesia placed a Swan-Ganz catheter and an arterial blood pressure monitoring line.  She was taken to the operating room, anesthetized, and intubated.  Intravenous  antibiotics were administered.  A Foley catheter was placed.  Transesophageal echocardiography was performed by Dr. Annye Asa.  Please see his separate note for full details of the procedure.  There was preserved left ventricular function with no significant valvular pathology.  The chest, abdomen, and legs were prepped and draped in usual sterile fashion.  An incision made in the medial aspect of the right leg at the level of the knee.  The greater saphenous vein was harvested from the right thigh endoscopically.  The saphenous vein was a good quality vessel. Simultaneously, a median sternotomy was performed and the left internal mammary artery was harvested using standard technique.  This was difficult due to adhesions in the left pleural space and the patient's body habitus.  When divided distally, the mammary had good flow.  2000 units of heparin was administered during the vessel harvest.  The remainder of full heparin dose was given after harvesting the conduits.  A sternal retractor was placed.  The pericardium was opened.  There was a small pericardial effusion.  The aorta was of normal caliber with no evidence of atherosclerotic disease.  After confirming adequate anticoagulation with ACT measurement, the aorta was cannulated via concentric 2-0 Ethibond pledgeted pursestring sutures.  A dual-stage venous cannula was placed via pursestring suture in the right atrial appendage.  Cardiopulmonary bypass was initiated.  Flows were maintained per protocol.  The patient was cooled to 34  degrees Celsius.  The coronary arteries were inspected and anastomotic sites were chosen.  The conduits were inspected and cut to length.  A foam pad was placed in the pericardium to insulate the heart.  A temperature probe was placed in myocardial septum and a cardioplegia cannula was placed in the ascending aorta.  The aorta was crossclamped.  The left ventricle was emptied via the aortic root vent.   Cardiac arrest then was achieved with a combination of 1.5 L of cold antegrade blood cardioplegia and topical iced saline. After achieving a complete diastolic arrest and myocardial septal cooling to 11 degrees Celsius, the following distal anastomoses were performed.  A reversed saphenous vein graft was placed end-to-side to the ramus intermedius.  This was a 1.5-mm good quality target vessel.  The vein was of good quality.  The end-to-side anastomosis was performed with a running 7-0 Prolene suture.  All anastomoses were probed proximally and distally prior to tying the suture.  Cardioplegia was administered down the graft.  There were good flow and good hemostasis.  Next, a reversed saphenous vein graft was placed end-to-side to the first diagonal branch to the LAD.  This was slightly smaller than the ramus intermedius and had a thin wall.  A probe did pass distally.  The vein was again of good quality.  The end-to-side anastomosis was performed with a running 7-0 Prolene suture.  There were good flow and good hemostasis with cardioplegia administration.  Additional cardioplegia was administered down the aortic root.  The left internal mammary artery then was brought through an incision in the pericardium.  The distal end was beveled.  It was a 1.5-mm vessel with excellent flow.  It was anastomosed end-to-side to the distal LAD.  The LAD was a 1.5-mm good quality target.  The end-to-side anastomosis was performed with a running 8-0 Prolene suture.  At completion of anastomosis, the bulldog clamp was removed.  Rapid septal rewarming was noted.  The bulldog clamp was replaced and the mammary pedicle was tacked to the epicardial surface of the heart with 6-0 Prolene sutures.  Additional cardioplegia was administered down the vein grafts that were cut to length.  The cardioplegia cannula was removed from the ascending aorta.  The proximal vein graft anastomoses were performed to 4.5  mm punch aortotomies with running 6-0 Prolene sutures.  At the completion of final proximal anastomosis, the patient was placed in Trendelenburg position.  Lidocaine was administered.  The left ventricle and aortic root were de-aired.  The aortic crossclamp was removed.  The total crossclamp time was 59 minutes.  The patient required a single defibrillation and then was in sinus rhythm thereafter.  While rewarming was completed, all proximal and distal anastomoses were inspected for hemostasis.  Epicardial pacing wires were placed on the right ventricle and right atrium.  DDD pacing was initiated for bradycardia.  She was then weaned from cardiopulmonary bypass on the first attempt without difficulty.  The total bypass time was 88 minutes. The initial cardiac index was 2 L/min/m2 and the patient remained hemodynamically stable throughout the post-bypass period.  A test dose of protamine was administered and it was well tolerated. The atrial and aortic cannulae were removed.  The remainder of the protamine was administered without incident.  The chest was irrigated with warm saline.  Hemostasis was achieved.  Left pleural and mediastinal chest tubes were placed through separate subcostal incisions.  The pericardium was not closed.  The sternum was closed with a combination of  single and double heavy gauge stainless steel wires.  The pectoralis fascia, subcutaneous tissue, and skin were closed in standard fashion.  All sponge, needle, and instrument counts were correct at the end of the procedure.  The patient was taken from the operating room to the Surgical Intensive Care Unit in good condition.     Revonda Standard Roxan Hockey, M.D.     SCH/MEDQ  D:  07/15/2017  T:  07/15/2017  Job:  [id number]

## 2017-07-15 NOTE — Progress Notes (Signed)
Paged Dr Prescott Gum, after second attempt to wean pt ABG resulted PH 7.29, PCO2 44.4, PO2  97, BEB -5, HCO3  21.1 Sat 96. Pt is drowsy still but easy to arouse. RT was attempting to get parameters while one the phone.  Md wants to give pt a couple more hrs to wake up more and then proceed with weaning again. Will cont to monitor and assess and carry out MDs orders

## 2017-07-15 NOTE — Interval H&P Note (Signed)
History and Physical Interval Note:  07/15/2017 7:26 AM  Gloria Mccoy  has presented today for surgery, with the diagnosis of CAD  The various methods of treatment have been discussed with the patient and family. After consideration of risks, benefits and other options for treatment, the patient has consented to  Procedure(s): CORONARY ARTERY BYPASS GRAFTING (CABG) (N/A) TRANSESOPHAGEAL ECHOCARDIOGRAM (TEE) (N/A) as a surgical intervention .  The patient's history has been reviewed, patient examined, no change in status, stable for surgery.  I have reviewed the patient's chart and labs.  Questions were answered to the patient's satisfaction.     Melrose Nakayama

## 2017-07-15 NOTE — Anesthesia Postprocedure Evaluation (Signed)
Anesthesia Post Note  Patient: Gloria Mccoy  Procedure(s) Performed: CORONARY ARTERY BYPASS GRAFTING (CABG) X 3 USING LEFT INTERNAL MAMMARY ARTERY AND RIGHT SAPHENOUS VEIN- ENDOSCOPICALLY HARVESTED (N/A Chest) TRANSESOPHAGEAL ECHOCARDIOGRAM (TEE) (N/A )     Patient location during evaluation: PACU Anesthesia Type: General Level of consciousness: sedated and patient remains intubated per anesthesia plan Pain management: pain level controlled Vital Signs Assessment: post-procedure vital signs reviewed and stable Respiratory status: patient remains intubated per anesthesia plan and patient on ventilator - see flowsheet for VS Cardiovascular status: stable Anesthetic complications: no    Last Vitals:  Vitals:   07/15/17 1521 07/15/17 1530  BP:    Pulse:    Resp: 14 12  Temp: (!) 35.8 C (!) 35.8 C  SpO2: 98% 98%    Last Pain:  Vitals:   07/15/17 1519  TempSrc: Core (Comment)  PainSc:                  Gloria Mccoy

## 2017-07-15 NOTE — Progress Notes (Signed)
RT retracted patient's ETT 2cm to 24@ lip per MD order. Patient tolerated well. ETT holder placed. RT will continue to monitor.

## 2017-07-15 NOTE — Anesthesia Procedure Notes (Signed)
Procedure Name: Intubation Date/Time: 07/15/2017 7:53 AM Performed by: [name], MD Pre-anesthesia Checklist: Patient identified, Emergency Drugs available, Suction available, Patient being monitored and Timeout performed Patient Re-evaluated:Patient Re-evaluated prior to induction Oxygen Delivery Method: Circle system utilized Preoxygenation: Pre-oxygenation with 100% oxygen Induction Type: IV induction Ventilation: Oral airway inserted - appropriate to patient size and Mask ventilation without difficulty Laryngoscope Size: Mac and 4 Grade View: Grade III Tube type: Oral Tube size: 8.0 mm Number of attempts: 1 Airway Equipment and Method: Stylet Placement Confirmation: ETT inserted through vocal cords under direct vision,  positive ETCO2 and breath sounds checked- equal and bilateral Secured at: 22 cm Tube secured with: Tape Dental Injury: Teeth and Oropharynx as per pre-operative assessment

## 2017-07-15 NOTE — Anesthesia Procedure Notes (Signed)
Arterial Line Insertion Performed by: CRNA  Patient location: Pre-op. Lidocaine 1% used for infiltration Right, radial was placed Catheter size: 20 G Hand hygiene performed , maximum sterile barriers used  and Seldinger technique used Allen's test indicative of satisfactory collateral circulation Attempts: 1 Procedure performed without using ultrasound guided technique. Following insertion, dressing applied and Biopatch. Post procedure assessment: normal  Patient tolerated the procedure well with no immediate complications.

## 2017-07-15 NOTE — Progress Notes (Signed)
  Echocardiogram Echocardiogram Transesophageal has been performed.  Gloria Mccoy 07/15/2017, 8:39 AM

## 2017-07-15 NOTE — Transfer of Care (Signed)
Immediate Anesthesia Transfer of Care Note  Patient: Gloria Mccoy  Procedure(s) Performed: CORONARY ARTERY BYPASS GRAFTING (CABG) X 3 USING LEFT INTERNAL MAMMARY ARTERY AND RIGHT SAPHENOUS VEIN- ENDOSCOPICALLY HARVESTED (N/A Chest) TRANSESOPHAGEAL ECHOCARDIOGRAM (TEE) (N/A )  Patient Location: ICU  Anesthesia Type:General  Level of Consciousness: sedated and Patient remains intubated per anesthesia plan  Airway & Oxygen Therapy: Patient remains intubated per anesthesia plan and Patient placed on Ventilator (see vital sign flow sheet for setting)  Post-op Assessment: Report given to RN and Post -op Vital signs reviewed and stable  Post vital signs: Reviewed and stable  Last Vitals:  Vitals:   07/15/17 0430 07/15/17 1245  BP: (!) 128/58   Pulse:    Resp: 18 12  Temp: 36.9 C (!) 35.8 C  SpO2: 93% 95%    Last Pain:  Vitals:   07/15/17 1245  TempSrc: Core (Comment)  PainSc:       Patients Stated Pain Goal: 0 (16/10/96 0454)  Complications: No apparent anesthesia complications

## 2017-07-15 NOTE — Progress Notes (Signed)
Patient still too lethargic. RT placed patient on previous full support settings. RT will attempt to wean at later time. RT will continue to monitor.

## 2017-07-15 NOTE — Progress Notes (Signed)
CT surgery p.m. Rounds  Status post multivessel CABG earlier today Hemodynamics stable, oxygenation good Patient still narcotizedf from anesthesia not ready for extubation We'll proceed with extubation protocol when her level of consciousness- mental status improves.

## 2017-07-15 NOTE — Anesthesia Procedure Notes (Addendum)
Central Venous Catheter Insertion Performed by: Duane Boston, MD, anesthesiologist Start/End1/25/2019 6:53 AM, 07/15/2017 7:03 AM Patient location: Pre-op. Preanesthetic checklist: patient identified, IV checked, site marked, risks and benefits discussed, surgical consent, monitors and equipment checked, pre-op evaluation, timeout performed and anesthesia consent Position: Trendelenburg Lidocaine 1% used for infiltration and patient sedated Hand hygiene performed , maximum sterile barriers used  and Seldinger technique used Catheter size: 8.5 Fr Total catheter length 8. PA cath was placed.Sheath introducer Swan type:thermodilution PA Cath depth:50 Procedure performed using ultrasound guided technique. Ultrasound Notes:anatomy identified, needle tip was noted to be adjacent to the nerve/plexus identified, no ultrasound evidence of intravascular and/or intraneural injection and image(s) printed for medical record Attempts: 1 Following insertion, line sutured and dressing applied. Post procedure assessment: free fluid flow, blood return through all ports and no air  Patient tolerated the procedure well with no immediate complications.

## 2017-07-16 ENCOUNTER — Inpatient Hospital Stay (HOSPITAL_COMMUNITY): Payer: Medicare HMO

## 2017-07-16 LAB — BASIC METABOLIC PANEL
Anion gap: 8 (ref 5–15)
Anion gap: 10 (ref 5–15)
BUN: 8 mg/dL (ref 6–20)
BUN: 9 mg/dL (ref 6–20)
CALCIUM: 8 mg/dL — AB (ref 8.9–10.3)
CALCIUM: 8.2 mg/dL — AB (ref 8.9–10.3)
CO2: 21 mmol/L — ABNORMAL LOW (ref 22–32)
CO2: 23 mmol/L (ref 22–32)
Chloride: 108 mmol/L (ref 101–111)
Chloride: 104 mmol/L (ref 101–111)
Creatinine, Ser: 0.8 mg/dL (ref 0.44–1.00)
Creatinine, Ser: 1.26 mg/dL — ABNORMAL HIGH (ref 0.44–1.00)
GFR calc Af Amer: 48 mL/min — ABNORMAL LOW (ref >60)
GFR, EST NON AFRICAN AMERICAN: 42 mL/min — AB (ref >60)
GLUCOSE: 120 mg/dL — AB (ref 65–99)
Glucose, Bld: 107 mg/dL — ABNORMAL HIGH (ref 65–99)
Potassium: 3.9 mmol/L (ref 3.5–5.1)
Potassium: 4.2 mmol/L (ref 3.5–5.1)
Sodium: 137 mmol/L (ref 135–145)
Sodium: 137 mmol/L (ref 135–145)

## 2017-07-16 LAB — POCT I-STAT 3, ART BLOOD GAS (G3+)
ACID-BASE DEFICIT: 3 mmol/L — AB (ref 0.0–2.0)
Acid-base deficit: 3 mmol/L — ABNORMAL HIGH (ref 0.0–2.0)
BICARBONATE: 23.3 mmol/L (ref 20.0–28.0)
Bicarbonate: 22.2 mmol/L (ref 20.0–28.0)
O2 Saturation: 97 %
O2 Saturation: 99 %
TCO2: 23 mmol/L (ref 22–32)
TCO2: 25 mmol/L (ref 22–32)
pCO2 arterial: 43.2 mmHg (ref 32.0–48.0)
pCO2 arterial: 46.5 mmHg (ref 32.0–48.0)
pH, Arterial: 7.313 — ABNORMAL LOW (ref 7.350–7.450)
pH, Arterial: 7.325 — ABNORMAL LOW (ref 7.350–7.450)
pO2, Arterial: 102 mmHg (ref 83.0–108.0)
pO2, Arterial: 148 mmHg — ABNORMAL HIGH (ref 83.0–108.0)

## 2017-07-16 LAB — GLUCOSE, CAPILLARY
GLUCOSE-CAPILLARY: 109 mg/dL — AB (ref 65–99)
GLUCOSE-CAPILLARY: 120 mg/dL — AB (ref 65–99)
GLUCOSE-CAPILLARY: 97 mg/dL (ref 65–99)
GLUCOSE-CAPILLARY: 96 mg/dL (ref 65–99)
GLUCOSE-CAPILLARY: 155 mg/dL — AB (ref 65–99)
GLUCOSE-CAPILLARY: 119 mg/dL — AB (ref 65–99)
Glucose-Capillary: 108 mg/dL — ABNORMAL HIGH (ref 65–99)
Glucose-Capillary: 126 mg/dL — ABNORMAL HIGH (ref 65–99)
Glucose-Capillary: 137 mg/dL — ABNORMAL HIGH (ref 65–99)
Glucose-Capillary: 138 mg/dL — ABNORMAL HIGH (ref 65–99)

## 2017-07-16 LAB — CBC
HCT: 29.2 % — ABNORMAL LOW (ref 36.0–46.0)
HEMATOCRIT: 29.1 % — AB (ref 36.0–46.0)
Hemoglobin: 10.3 g/dL — ABNORMAL LOW (ref 12.0–15.0)
Hemoglobin: 10.3 g/dL — ABNORMAL LOW (ref 12.0–15.0)
MCH: 30 pg (ref 26.0–34.0)
MCH: 30 pg (ref 26.0–34.0)
MCHC: 35.4 g/dL (ref 30.0–36.0)
MCHC: 35.3 g/dL (ref 30.0–36.0)
MCV: 84.8 fL (ref 78.0–100.0)
MCV: 85.1 fL (ref 78.0–100.0)
PLATELETS: 131 10*3/uL — AB (ref 150–400)
PLATELETS: 124 10*3/uL — AB (ref 150–400)
RBC: 3.43 MIL/uL — AB (ref 3.87–5.11)
RBC: 3.43 MIL/uL — ABNORMAL LOW (ref 3.87–5.11)
RDW: 13.7 % (ref 11.5–15.5)
RDW: 13.9 % (ref 11.5–15.5)
WBC: 15.9 10*3/uL — AB (ref 4.0–10.5)
WBC: 17.5 10*3/uL — ABNORMAL HIGH (ref 4.0–10.5)

## 2017-07-16 LAB — POCT I-STAT, CHEM 8
BUN: 12 mg/dL (ref 6–20)
CALCIUM ION: 1.23 mmol/L (ref 1.15–1.40)
Chloride: 99 mmol/L — ABNORMAL LOW (ref 101–111)
Creatinine, Ser: 1.2 mg/dL — ABNORMAL HIGH (ref 0.44–1.00)
GLUCOSE: 325 mg/dL — AB (ref 65–99)
HCT: 32 % — ABNORMAL LOW (ref 36.0–46.0)
Hemoglobin: 10.9 g/dL — ABNORMAL LOW (ref 12.0–15.0)
Potassium: 4.1 mmol/L (ref 3.5–5.1)
Sodium: 138 mmol/L (ref 135–145)
TCO2: 25 mmol/L (ref 22–32)

## 2017-07-16 LAB — CREATININE, SERUM
CREATININE: 1.27 mg/dL — AB (ref 0.44–1.00)
Creatinine, Ser: 0.86 mg/dL (ref 0.44–1.00)
GFR calc Af Amer: >60 mL/min (ref >60)
GFR calc non Af Amer: >60 mL/min (ref >60)
GFR, EST AFRICAN AMERICAN: 48 mL/min — AB (ref >60)
GFR, EST NON AFRICAN AMERICAN: 41 mL/min — AB (ref >60)

## 2017-07-16 LAB — MAGNESIUM
MAGNESIUM: 2.5 mg/dL — AB (ref 1.7–2.4)
Magnesium: 2.4 mg/dL (ref 1.7–2.4)

## 2017-07-16 MED ORDER — ORAL CARE MOUTH RINSE: 15.0000 mL | Freq: Two times a day (BID) | OROMUCOSAL | Status: DC

## 2017-07-16 MED ORDER — INSULIN ASPART 100 UNIT/ML ~~LOC~~ SOLN
0.0000 [IU] | SUBCUTANEOUS | Status: DC
  Administered 2017-07-16 – 2017-07-17 (×7): 2 [IU] via SUBCUTANEOUS

## 2017-07-16 MED ORDER — FUROSEMIDE 10 MG/ML IJ SOLN
20.0000 mg | Freq: Two times a day (BID) | INTRAMUSCULAR | Status: DC
  Administered 2017-07-16 – 2017-07-17 (×3): 20 mg via INTRAVENOUS
  Filled 2017-07-16 (×3): qty 2

## 2017-07-16 MED ORDER — INSULIN ASPART 100 UNIT/ML ~~LOC~~ SOLN
0.0000 [IU] | SUBCUTANEOUS | Status: DC
  Administered 2017-07-16: 2 [IU] via SUBCUTANEOUS

## 2017-07-16 MED ORDER — METOPROLOL TARTRATE 25 MG PO TABS
25.0000 mg | ORAL_TABLET | Freq: Two times a day (BID) | ORAL | Status: DC
  Administered 2017-07-16 – 2017-07-18 (×5): 25 mg via ORAL
  Filled 2017-07-16 (×5): qty 1

## 2017-07-16 MED ORDER — ENOXAPARIN SODIUM 30 MG/0.3ML ~~LOC~~ SOLN
30.0000 mg | Freq: Every day | SUBCUTANEOUS | Status: DC
  Administered 2017-07-16 – 2017-07-17 (×2): 30 mg via SUBCUTANEOUS
  Filled 2017-07-16 (×2): qty 0.3

## 2017-07-16 NOTE — Progress Notes (Signed)
Rapid wean protocol started at 00:12.

## 2017-07-16 NOTE — Progress Notes (Signed)
Notified respiratory of pt's ABG within parameters, readiness to test mechanics.

## 2017-07-16 NOTE — Progress Notes (Signed)
CT Surg  OOB to chair but pain a problem Change morphine to fentanyl Labs ok

## 2017-07-16 NOTE — Procedures (Signed)
Extubation Procedure Note  Patient Details:   Name: Gloria Mccoy DOB: [birth year] MRN: [id number]   Airway Documentation:     Evaluation  O2 sats: stable throughout Complications: No apparent complications Patient did tolerate procedure well. Bilateral Breath Sounds: Clear, Diminished   Yes   Pulmonary mechanics done prior to extubation NIF -38, VC 678ml(10ccs=419.2), positive cuff leak. Pt extubated to 4L [place] and able to speak afterwards. Pt completed 300 on the IS with RN: pt still perfecting technique. RT to cont to monitor.   Hanifah Royse F 07/16/2017, 1:11 AM

## 2017-07-16 NOTE — Progress Notes (Signed)
Pt extubated to 4L [place] per rapid wean protocol. Able to cough on command and verbalize name and birthday. Practiced IS with pt, pulled ~250. Will continue aggressive pulmonary toilet. Will continue to monitor closely.

## 2017-07-16 NOTE — Progress Notes (Signed)
1 Day Post-Op Procedure(s) (LRB): CORONARY ARTERY BYPASS GRAFTING (CABG) X 3 USING LEFT INTERNAL MAMMARY ARTERY AND RIGHT SAPHENOUS VEIN- ENDOSCOPICALLY HARVESTED (N/A) TRANSESOPHAGEAL ECHOCARDIOGRAM (TEE) (N/A) Subjective: Patient extubated after midnight Feels well this a.m. without significant pain Off pressors  with sinus rhythm  Objective: Vital signs in last 24 hours: Temp:  [96.1 F (35.6 C)-101.3 F (38.5 C)] 99.4 F (37.4 C) (01/26 1146) Pulse Rate:  [65-90] 80 (01/26 1100) Cardiac Rhythm: Normal sinus rhythm (01/26 0800) Resp:  [11-40] 19 (01/26 1100) BP: (96-151)/(51-90) 119/73 (01/26 1100) SpO2:  [95 %-100 %] 95 % (01/26 1100) Arterial Line BP: (98-194)/(43-106) 142/55 (01/26 1000) FiO2 (%):  [40 %-50 %] 40 % (01/26 0024) Weight:  [208 lb 8.9 oz (94.6 kg)] 208 lb 8.9 oz (94.6 kg) (01/26 0500)  Hemodynamic parameters for last 24 hours: PAP: (24-156)/(11-56) 48/13 CO:  [2.9 L/min-4.3 L/min] 4.3 L/min CI:  [1.5 L/min/m2-2.2 L/min/m2] 2.2 L/min/m2  Intake/Output from previous day: 01/25 0701 - 01/26 0700 In: 5046.9 [P.O.:60; I.V.:2906.9; Blood:410; IV TDHRCBULA:4536] Out: 4680 [Urine:3295; Blood:800; Chest Tube:320] Intake/Output this shift: Total I/O In: 149.8 [I.V.:99.8; IV Piggyback:50] Out: 120 [Urine:100; Chest Tube:20]       Exam    General- alert and comfortable    Neck- no JVD, no cervical adenopathy palpable, no carotid bruit   Lungs- clear without rales, wheezes   Cor- regular rate and rhythm, no murmur , gallop   Abdomen- soft, non-tender   Extremities - warm, non-tender, minimal edema   Neuro- oriented, appropriate, no focal weakness   Lab Results: Recent Labs    07/15/17 1815 07/15/17 1829 07/16/17 0337  WBC 16.2*  --  15.9*  HGB 11.3* 10.9* 10.3*  HCT 31.4* 32.0* 29.1*  PLT 133*  --  131*   BMET:  Recent Labs    07/15/17 0533  07/15/17 1829 07/16/17 0337 07/16/17 1007  NA 143   < > 141 137  --   K 3.9   < > 5.2* 3.9  --   CL  111   < > 107 108  --   CO2 23  --   --  21*  --   GLUCOSE 116*   < > 113* 107*  --   BUN 12   < > 7 8  --   CREATININE 0.81   < > 0.60 0.80 0.86  CALCIUM 8.8*  --   --  8.0*  --    < > = values in this interval not displayed.    PT/INR:  Recent Labs    07/15/17 1242  LABPROT 16.4*  INR 1.33   ABG    Component Value Date/Time   PHART 7.313 (L) 07/16/2017 0215   HCO3 23.3 07/16/2017 0215   TCO2 25 07/16/2017 0215   ACIDBASEDEF 3.0 (H) 07/16/2017 0215   O2SAT 99.0 07/16/2017 0215   CBG (last 3)  Recent Labs    07/16/17 0556 07/16/17 0858 07/16/17 1143  GLUCAP 96 126* 138*    Assessment/Plan: S/P Procedure(s) (LRB): CORONARY ARTERY BYPASS GRAFTING (CABG) X 3 USING LEFT INTERNAL MAMMARY ARTERY AND RIGHT SAPHENOUS VEIN- ENDOSCOPICALLY HARVESTED (N/A) TRANSESOPHAGEAL ECHOCARDIOGRAM (TEE) (N/A) Mobilize Diuresis Diabetes control See progression orders   LOS: 2 days    Gloria Mccoy 07/16/2017

## 2017-07-17 ENCOUNTER — Inpatient Hospital Stay (HOSPITAL_COMMUNITY): Payer: Medicare HMO

## 2017-07-17 LAB — BASIC METABOLIC PANEL
Anion gap: 10 (ref 5–15)
BUN: 13 mg/dL (ref 6–20)
CO2: 23 mmol/L (ref 22–32)
Calcium: 8.1 mg/dL — ABNORMAL LOW (ref 8.9–10.3)
Chloride: 102 mmol/L (ref 101–111)
Creatinine, Ser: 1.26 mg/dL — ABNORMAL HIGH (ref 0.44–1.00)
GFR calc Af Amer: 48 mL/min — ABNORMAL LOW (ref >60)
GFR calc non Af Amer: 42 mL/min — ABNORMAL LOW (ref >60)
Glucose, Bld: 113 mg/dL — ABNORMAL HIGH (ref 65–99)
Potassium: 4.1 mmol/L (ref 3.5–5.1)
Sodium: 135 mmol/L (ref 135–145)

## 2017-07-17 LAB — POCT I-STAT, CHEM 8
BUN: 14 mg/dL (ref 6–20)
CALCIUM ION: 1.2 mmol/L (ref 1.15–1.40)
Chloride: 96 mmol/L — ABNORMAL LOW (ref 101–111)
Creatinine, Ser: 1 mg/dL (ref 0.44–1.00)
GLUCOSE: 129 mg/dL — AB (ref 65–99)
HCT: 31 % — ABNORMAL LOW (ref 36.0–46.0)
HEMOGLOBIN: 10.5 g/dL — AB (ref 12.0–15.0)
Potassium: 3.7 mmol/L (ref 3.5–5.1)
SODIUM: 136 mmol/L (ref 135–145)
TCO2: 28 mmol/L (ref 22–32)

## 2017-07-17 LAB — CBC
HCT: 28.7 % — ABNORMAL LOW (ref 36.0–46.0)
Hemoglobin: 10 g/dL — ABNORMAL LOW (ref 12.0–15.0)
MCH: 29.9 pg (ref 26.0–34.0)
MCHC: 34.8 g/dL (ref 30.0–36.0)
MCV: 85.7 fL (ref 78.0–100.0)
Platelets: 121 10*3/uL — ABNORMAL LOW (ref 150–400)
RBC: 3.35 MIL/uL — ABNORMAL LOW (ref 3.87–5.11)
RDW: 13.8 % (ref 11.5–15.5)
WBC: 18 10*3/uL — ABNORMAL HIGH (ref 4.0–10.5)

## 2017-07-17 LAB — GLUCOSE, CAPILLARY
GLUCOSE-CAPILLARY: 120 mg/dL — AB (ref 65–99)
GLUCOSE-CAPILLARY: 135 mg/dL — AB (ref 65–99)
GLUCOSE-CAPILLARY: 144 mg/dL — AB (ref 65–99)
Glucose-Capillary: 110 mg/dL — ABNORMAL HIGH (ref 65–99)
Glucose-Capillary: 124 mg/dL — ABNORMAL HIGH (ref 65–99)
Glucose-Capillary: 135 mg/dL — ABNORMAL HIGH (ref 65–99)
Glucose-Capillary: 99 mg/dL (ref 65–99)

## 2017-07-17 MED ORDER — OXYCODONE HCL 5 MG PO TABS
5.0000 mg | ORAL_TABLET | ORAL | Status: DC | PRN
  Administered 2017-07-17: 5 mg via ORAL
  Filled 2017-07-17: qty 1

## 2017-07-17 MED ORDER — METOLAZONE 5 MG PO TABS
5.0000 mg | ORAL_TABLET | Freq: Once | ORAL | Status: AC
  Administered 2017-07-18: 5 mg via ORAL
  Filled 2017-07-17: qty 1

## 2017-07-17 MED ORDER — POTASSIUM CHLORIDE CRYS ER 20 MEQ PO TBCR
20.0000 meq | EXTENDED_RELEASE_TABLET | ORAL | Status: DC
  Administered 2017-07-17 (×2): 20 meq via ORAL
  Filled 2017-07-17 (×3): qty 1

## 2017-07-17 MED ORDER — FENTANYL CITRATE (PF) 100 MCG/2ML IJ SOLN: 50.0000 ug | INTRAMUSCULAR | Status: DC | PRN

## 2017-07-17 MED ORDER — FUROSEMIDE 10 MG/ML IJ SOLN
40.0000 mg | Freq: Two times a day (BID) | INTRAMUSCULAR | Status: DC
  Administered 2017-07-17 – 2017-07-18 (×3): 40 mg via INTRAVENOUS
  Filled 2017-07-17 (×3): qty 4

## 2017-07-17 MED ORDER — FENTANYL CITRATE (PF) 100 MCG/2ML IJ SOLN: 50.0000 ug | INTRAMUSCULAR | Status: AC | PRN

## 2017-07-17 NOTE — Progress Notes (Signed)
CT surgery p.m. Rounds  Patient strength significantly improved-able to walk 300 feet in hallway Responding well to Lasix diuresis Weaning oxygen requirement Pain well-controlled No bowel movement yet

## 2017-07-17 NOTE — Progress Notes (Signed)
2 Days Post-Op Procedure(s) (LRB): CORONARY ARTERY BYPASS GRAFTING (CABG) X 3 USING LEFT INTERNAL MAMMARY ARTERY AND RIGHT SAPHENOUS VEIN- ENDOSCOPICALLY HARVESTED (N/A) TRANSESOPHAGEAL ECHOCARDIOGRAM (TEE) (N/A) Subjective: slow progress PT ordered Needs lasix nst  Objective: Vital signs in last 24 hours: Temp:  [98.5 F (36.9 C)-99.4 F (37.4 C)] 99.2 F (37.3 C) (01/27 0747) Pulse Rate:  [68-88] 77 (01/27 0931) Cardiac Rhythm: Normal sinus rhythm (01/27 0800) Resp:  [10-28] 13 (01/27 0800) BP: (93-153)/(57-91) 153/82 (01/27 0931) SpO2:  [92 %-100 %] 98 % (01/27 0800) Arterial Line BP: (142)/(55) 142/55 (01/26 1000) Weight:  [205 lb 14.4 oz (93.4 kg)] 205 lb 14.4 oz (93.4 kg) (01/27 0500)  Hemodynamic parameters for last 24 hours:    Intake/Output from previous day: 01/26 0701 - 01/27 0700 In: 639.8 [I.V.:479.8; IV Piggyback:160] Out: 6387 [Urine:1005; Chest Tube:190] Intake/Output this shift: Total I/O In: 50 [IV Piggyback:50] Out: 75 [Urine:75]    Lab Results: Recent Labs    07/16/17 1633 07/16/17 1850 07/17/17 0509  WBC 17.5*  --  18.0*  HGB 10.3* 10.9* 10.0*  HCT 29.2* 32.0* 28.7*  PLT 124*  --  121*   BMET:  Recent Labs    07/16/17 1633 07/16/17 1850 07/17/17 0509  NA 137 138 135  K 4.2 4.1 4.1  CL 104 99* 102  CO2 23  --  23  GLUCOSE 120* 325* 113*  BUN 9 12 13   CREATININE 1.26*  1.27* 1.20* 1.26*  CALCIUM 8.2*  --  8.1*    PT/INR:  Recent Labs    07/15/17 1242  LABPROT 16.4*  INR 1.33   ABG    Component Value Date/Time   PHART 7.313 (L) 07/16/2017 0215   HCO3 23.3 07/16/2017 0215   TCO2 25 07/16/2017 1850   ACIDBASEDEF 3.0 (H) 07/16/2017 0215   O2SAT 99.0 07/16/2017 0215   CBG (last 3)  Recent Labs    07/17/17 0056 07/17/17 0337 07/17/17 0744  GLUCAP 135* 110* 99    Assessment/Plan: S/P Procedure(s) (LRB): CORONARY ARTERY BYPASS GRAFTING (CABG) X 3 USING LEFT INTERNAL MAMMARY ARTERY AND RIGHT SAPHENOUS VEIN-  ENDOSCOPICALLY HARVESTED (N/A) TRANSESOPHAGEAL ECHOCARDIOGRAM (TEE) (N/A) Mobilize Diuresis Diabetes control PT ordered Follow WBC, DC foley  LOS: 3 days    Tharon Aquas Trigt III 07/17/2017

## 2017-07-17 NOTE — Plan of Care (Signed)
Patient ambulated in am.  She was in pain when waking up.  Still requires o2 at rest.

## 2017-07-18 ENCOUNTER — Encounter (HOSPITAL_COMMUNITY): Payer: Self-pay | Admitting: Thoracic Surgery (Cardiothoracic Vascular Surgery)

## 2017-07-18 ENCOUNTER — Inpatient Hospital Stay (HOSPITAL_COMMUNITY): Payer: Medicare HMO

## 2017-07-18 LAB — GLUCOSE, CAPILLARY
Glucose-Capillary: 111 mg/dL — ABNORMAL HIGH (ref 65–99)
Glucose-Capillary: 91 mg/dL (ref 65–99)
Glucose-Capillary: 112 mg/dL — ABNORMAL HIGH (ref 65–99)

## 2017-07-18 LAB — CBC
HEMATOCRIT: 28.2 % — AB (ref 36.0–46.0)
Hemoglobin: 10 g/dL — ABNORMAL LOW (ref 12.0–15.0)
MCH: 30 pg (ref 26.0–34.0)
MCHC: 35.5 g/dL (ref 30.0–36.0)
MCV: 84.7 fL (ref 78.0–100.0)
Platelets: 135 10*3/uL — ABNORMAL LOW (ref 150–400)
RBC: 3.33 MIL/uL — ABNORMAL LOW (ref 3.87–5.11)
RDW: 13.5 % (ref 11.5–15.5)
WBC: 14.4 10*3/uL — AB (ref 4.0–10.5)

## 2017-07-18 LAB — BASIC METABOLIC PANEL
ANION GAP: 9 (ref 5–15)
BUN: 11 mg/dL (ref 6–20)
CALCIUM: 8.3 mg/dL — AB (ref 8.9–10.3)
CO2: 26 mmol/L (ref 22–32)
Chloride: 100 mmol/L — ABNORMAL LOW (ref 101–111)
Creatinine, Ser: 0.91 mg/dL (ref 0.44–1.00)
GFR calc Af Amer: >60 mL/min (ref >60)
GFR calc non Af Amer: >60 mL/min (ref >60)
GLUCOSE: 124 mg/dL — AB (ref 65–99)
POTASSIUM: 3.8 mmol/L (ref 3.5–5.1)
Sodium: 135 mmol/L (ref 135–145)

## 2017-07-18 MED ORDER — TRAMADOL HCL 50 MG PO TABS
50.0000 mg | ORAL_TABLET | ORAL | Status: DC | PRN
  Administered 2017-07-20 (×2): 50 mg via ORAL
  Filled 2017-07-18 (×2): qty 1

## 2017-07-18 MED ORDER — OXYCODONE HCL 5 MG PO TABS
5.0000 mg | ORAL_TABLET | ORAL | Status: DC | PRN
  Administered 2017-07-19: 5 mg via ORAL
  Filled 2017-07-18: qty 1

## 2017-07-18 MED ORDER — BISACODYL 10 MG RE SUPP: 10.0000 mg | Freq: Every day | RECTAL | Status: DC | PRN

## 2017-07-18 MED ORDER — BISACODYL 5 MG PO TBEC
10.0000 mg | DELAYED_RELEASE_TABLET | Freq: Every day | ORAL | Status: DC | PRN
Start: 2017-07-18 — End: 2017-07-20

## 2017-07-18 MED ORDER — ACETAMINOPHEN 325 MG PO TABS
650.0000 mg | ORAL_TABLET | Freq: Four times a day (QID) | ORAL | Status: DC | PRN
  Administered 2017-07-19: 650 mg via ORAL
  Filled 2017-07-18: qty 2

## 2017-07-18 MED ORDER — FUROSEMIDE 40 MG PO TABS
40.0000 mg | ORAL_TABLET | Freq: Every day | ORAL | Status: DC
  Administered 2017-07-19 – 2017-07-20 (×2): 40 mg via ORAL
  Filled 2017-07-18 (×2): qty 1

## 2017-07-18 MED ORDER — SODIUM CHLORIDE 0.9% FLUSH
3.0000 mL | Freq: Two times a day (BID) | INTRAVENOUS | Status: DC
  Administered 2017-07-18 – 2017-07-20 (×4): 3 mL via INTRAVENOUS

## 2017-07-18 MED ORDER — PANTOPRAZOLE SODIUM 40 MG PO TBEC
40.0000 mg | DELAYED_RELEASE_TABLET | Freq: Every day | ORAL | Status: DC
  Administered 2017-07-19 – 2017-07-20 (×2): 40 mg via ORAL
  Filled 2017-07-18 (×2): qty 1

## 2017-07-18 MED ORDER — SODIUM CHLORIDE 0.9 % IV SOLN: 250.0000 mL | INTRAVENOUS | Status: DC | PRN

## 2017-07-18 MED ORDER — METOLAZONE 5 MG PO TABS
5.0000 mg | ORAL_TABLET | Freq: Every day | ORAL | Status: AC
  Administered 2017-07-19 – 2017-07-20 (×2): 5 mg via ORAL
  Filled 2017-07-18 (×2): qty 1

## 2017-07-18 MED ORDER — SODIUM CHLORIDE 0.9% FLUSH
3.0000 mL | INTRAVENOUS | Status: DC | PRN
  Administered 2017-07-19: 3 mL via INTRAVENOUS
  Filled 2017-07-18: qty 3

## 2017-07-18 MED ORDER — ONDANSETRON HCL 4 MG PO TABS: 4.0000 mg | ORAL_TABLET | Freq: Four times a day (QID) | ORAL | Status: DC | PRN

## 2017-07-18 MED ORDER — METOPROLOL TARTRATE 25 MG PO TABS
25.0000 mg | ORAL_TABLET | Freq: Two times a day (BID) | ORAL | Status: DC
  Administered 2017-07-18 – 2017-07-20 (×4): 25 mg via ORAL
  Filled 2017-07-18 (×4): qty 1

## 2017-07-18 MED ORDER — POTASSIUM CHLORIDE CRYS ER 20 MEQ PO TBCR
20.0000 meq | EXTENDED_RELEASE_TABLET | Freq: Two times a day (BID) | ORAL | Status: DC
  Administered 2017-07-18 – 2017-07-19 (×3): 20 meq via ORAL
  Filled 2017-07-18 (×3): qty 1

## 2017-07-18 MED ORDER — ATORVASTATIN CALCIUM 10 MG PO TABS
10.0000 mg | ORAL_TABLET | Freq: Every day | ORAL | Status: DC
  Administered 2017-07-18 – 2017-07-19 (×2): 10 mg via ORAL
  Filled 2017-07-18 (×2): qty 1

## 2017-07-18 MED ORDER — ONDANSETRON HCL 4 MG/2ML IJ SOLN
4.0000 mg | Freq: Four times a day (QID) | INTRAMUSCULAR | Status: DC | PRN
  Administered 2017-07-20: 4 mg via INTRAVENOUS
  Filled 2017-07-18: qty 2

## 2017-07-18 MED ORDER — ASPIRIN EC 325 MG PO TBEC
325.0000 mg | DELAYED_RELEASE_TABLET | Freq: Every day | ORAL | Status: DC
  Administered 2017-07-19 – 2017-07-20 (×2): 325 mg via ORAL
  Filled 2017-07-18 (×2): qty 1

## 2017-07-18 MED ORDER — DOCUSATE SODIUM 100 MG PO CAPS
200.0000 mg | ORAL_CAPSULE | Freq: Every day | ORAL | Status: DC
  Administered 2017-07-20: 200 mg via ORAL
  Filled 2017-07-18 (×2): qty 2

## 2017-07-18 MED ORDER — ENOXAPARIN SODIUM 40 MG/0.4ML ~~LOC~~ SOLN
40.0000 mg | Freq: Every day | SUBCUTANEOUS | Status: DC
  Administered 2017-07-18 – 2017-07-19 (×2): 40 mg via SUBCUTANEOUS
  Filled 2017-07-18 (×2): qty 0.4

## 2017-07-18 MED ORDER — MOVING RIGHT ALONG BOOK
Freq: Once | Status: AC
  Administered 2017-07-18: 23:00:00
  Filled 2017-07-18: qty 1

## 2017-07-18 MED FILL — Magnesium Sulfate Inj 50%: INTRAMUSCULAR | Qty: 10 | Status: AC

## 2017-07-18 MED FILL — Heparin Sodium (Porcine) Inj 1000 Unit/ML: INTRAMUSCULAR | Qty: 30 | Status: AC

## 2017-07-18 MED FILL — Potassium Chloride Inj 2 mEq/ML: INTRAVENOUS | Qty: 40 | Status: AC

## 2017-07-18 NOTE — Evaluation (Signed)
Physical Therapy Evaluation Patient Details Name: Gloria Mccoy MRN: [id number] DOB: 27-Mar-1946 Today's Date: 07/18/2017   History of Present Illness  Pt adm for chest pain. Now s/p CABG x 3 on 07/14/17. PMH - HTN, arthritis, obesity, Meniere's disease  Clinical Impression  Pt mobilizing relatively well after CABG. Expect she will continue to make good progress and be able to return home. Will continue to see to increase independence with mobility and continue to assess need for HHPT.    Follow Up Recommendations Home health PT(May not need)    Equipment Recommendations  Other (comment)(rollator)    Recommendations for Other Services       Precautions / Restrictions Precautions Precautions: Sternal Restrictions Weight Bearing Restrictions: (sternal precautions )      Mobility  Bed Mobility Overal bed mobility: Needs Assistance Bed Mobility: Sit to Sidelying         Sit to sidelying: Min assist General bed mobility comments: Assist to bring legs up into bed. Verbal cues for technique.  Transfers Overall transfer level: Needs assistance Equipment used: 4-wheeled walker Transfers: Sit to/from Stand Sit to Stand: Min guard         General transfer comment: Cues for sternal precautions  Ambulation/Gait Ambulation/Gait assistance: Min guard Ambulation Distance (Feet): 390 Feet Assistive device: 4-wheeled walker Gait Pattern/deviations: Step-through pattern;Decreased stride length Gait velocity: decr Gait velocity interpretation: Below normal speed for age/gender General Gait Details: Slow, measured gait. Amb on RA with difficulty getting sufficient pleth on SpO2. With good pleth SpO2 >90%.  Stairs            Wheelchair Mobility    Modified Rankin (Stroke Patients Only)       Balance Overall balance assessment: No apparent balance deficits (not formally assessed)                                           Pertinent Vitals/Pain  Pain Assessment: No/denies pain    Home Living Family/patient expects to be discharged to:: Private residence Living Arrangements: Spouse/significant other Available Help at Discharge: Family;Available PRN/intermittently Type of Home: House Home Access: Stairs to enter Entrance Stairs-Rails: None Entrance Stairs-Number of Steps: 3 Home Layout: Two level Home Equipment: None      Prior Function Level of Independence: Independent               Hand Dominance        Extremity/Trunk Assessment   Upper Extremity Assessment Upper Extremity Assessment: Generalized weakness    Lower Extremity Assessment Lower Extremity Assessment: Generalized weakness       Communication   Communication: No difficulties  Cognition Arousal/Alertness: Awake/alert Behavior During Therapy: WFL for tasks assessed/performed Overall Cognitive Status: Within Functional Limits for tasks assessed                                        General Comments      Exercises     Assessment/Plan    PT Assessment Patient needs continued PT services  PT Problem List Decreased strength;Decreased activity tolerance;Decreased mobility;Decreased knowledge of use of DME;Decreased knowledge of precautions       PT Treatment Interventions DME instruction;Gait training;Stair training;Functional mobility training;Therapeutic activities;Therapeutic exercise;Patient/family education    PT Goals (Current goals can be found in the Care Plan section)  Acute Rehab PT Goals Patient Stated Goal: return home PT Goal Formulation: With patient Time For Goal Achievement: 07/25/17 Potential to Achieve Goals: Good    Frequency Min 3X/week   Barriers to discharge Inaccessible home environment stairs at home    Co-evaluation               AM-PAC PT "6 Clicks" Daily Activity  Outcome Measure Difficulty turning over in bed (including adjusting bedclothes, sheets and blankets)?: A  Little Difficulty moving from lying on back to sitting on the side of the bed? : Unable Difficulty sitting down on and standing up from a chair with arms (e.g., wheelchair, bedside commode, etc,.)?: A Little Help needed moving to and from a bed to chair (including a wheelchair)?: A Little Help needed walking in hospital room?: A Little Help needed climbing 3-5 steps with a railing? : A Little 6 Click Score: 16    End of Session   Activity Tolerance: Patient tolerated treatment well Patient left: in bed;with call bell/phone within reach;with nursing/sitter in room;with family/visitor present Nurse Communication: Mobility status PT Visit Diagnosis: Other abnormalities of gait and mobility (R26.89)    Time: 2703-5009 PT Time Calculation (min) (ACUTE ONLY): 22 min   Charges:   PT Evaluation $PT Eval Moderate Complexity: 1 Mod     PT G CodesMarland Kitchen        [facility] [place] 07/18/2017, 4:58 PM

## 2017-07-18 NOTE — Progress Notes (Addendum)
[facility] DAILY ICU PROGRESS NOTE                   [address]            [place] [address]          [phone]   3 Days Post-Op Procedure(s) (LRB): CORONARY ARTERY BYPASS GRAFTING (CABG) X 3 USING LEFT INTERNAL MAMMARY ARTERY AND RIGHT SAPHENOUS VEIN- ENDOSCOPICALLY HARVESTED (N/A) TRANSESOPHAGEAL ECHOCARDIOGRAM (TEE) (N/A)  Total Length of Stay:  LOS: 4 days   Subjective: Patient agreeable to have labs drawn for today. She has no specific complaints at this time.  Objective: Vital signs in last 24 hours: Temp:  [98.6 F (37 C)-99.8 F (37.7 C)] 99.8 F (37.7 C) (01/28 0800) Pulse Rate:  [67-79] 67 (01/27 1600) Cardiac Rhythm: Normal sinus rhythm (01/28 0830) Resp:  [9-30] 13 (01/28 0900) BP: (103-137)/(52-74) 121/64 (01/28 0800) SpO2:  [77 %-100 %] 99 % (01/28 0900)  Filed Weights   07/15/17 0430 07/16/17 0500 07/17/17 0500  Weight: 198 lb 3.2 oz (89.9 kg) 208 lb 8.9 oz (94.6 kg) 205 lb 14.4 oz (93.4 kg)    Pre op weight: 89.9 kg     Intake/Output from previous day: 01/27 0701 - 01/28 0700 In: 650 [P.O.:120; I.V.:480; IV Piggyback:50] Out: 675 [Urine:675]  Intake/Output this shift: No intake/output data recorded.  Current Meds: Scheduled Meds: . acetaminophen  1,000 mg Oral Q6H   Or  . acetaminophen (TYLENOL) oral liquid 160 mg/5 mL  1,000 mg Per Tube Q6H  . aspirin EC  325 mg Oral Daily   Or  . aspirin  324 mg Per Tube Daily  . bisacodyl  10 mg Oral Daily   Or  . bisacodyl  10 mg Rectal Daily  . Chlorhexidine Gluconate Cloth  6 each Topical Daily  . docusate sodium  200 mg Oral Daily  . enoxaparin (LOVENOX) injection  30 mg Subcutaneous QHS  . furosemide  40 mg Intravenous BID  . insulin aspart  0-24 Units Subcutaneous Q4H  . levothyroxine  75 mcg Oral QAC breakfast  . metoprolol tartrate  25 mg Oral BID  . omega-3 acid ethyl esters  1 g Oral Daily  . pantoprazole  40 mg Oral Daily  . sodium chloride flush  10-40 mL Intracatheter  Q12H  . sodium chloride flush  3 mL Intravenous Q12H   Continuous Infusions: . sodium chloride 20 mL/hr at 07/16/17 0800  . sodium chloride    . sodium chloride 10 mL/hr at 07/16/17 0700   PRN Meds:.sodium chloride, metoprolol tartrate, ondansetron (ZOFRAN) IV, oxyCODONE, sodium chloride flush, sodium chloride flush, traMADol  General appearance: alert, cooperative and no distress Heart: RRR Lungs: Diminished at bases Abdomen: Soft, obese, non tender, bowel sounds present Extremities: Mild LE edema Wound: Aquacel removed and slight bloody ooze proximal sternal wound. RLE wound is clean and dry  Lab Results: CBC: Recent Labs    07/16/17 1633  07/17/17 0509 07/17/17 1611  WBC 17.5*  --  18.0*  --   HGB 10.3*   < > 10.0* 10.5*  HCT 29.2*   < > 28.7* 31.0*  PLT 124*  --  121*  --    < > = values in this interval not displayed.   BMET:  Recent Labs    07/16/17 1633  07/17/17 0509 07/17/17 1611  NA 137   < > 135 136  K 4.2   < > 4.1 3.7  CL 104   < >  102 96*  CO2 23  --  23  --   GLUCOSE 120*   < > 113* 129*  BUN 9   < > 13 14  CREATININE 1.26*  1.27*   < > 1.26* 1.00  CALCIUM 8.2*  --  8.1*  --    < > = values in this interval not displayed.    CMET: Lab Results  Component Value Date   WBC 18.0 (H) 07/17/2017   HGB 10.5 (L) 07/17/2017   HCT 31.0 (L) 07/17/2017   PLT 121 (L) 07/17/2017   GLUCOSE 129 (H) 07/17/2017   CHOL 129 07/14/2017   TRIG 62 07/14/2017   HDL 31 (L) 07/14/2017   LDLCALC 86 07/14/2017   ALT 14 07/13/2017   AST 18 07/13/2017   NA 136 07/17/2017   K 3.7 07/17/2017   CL 96 (L) 07/17/2017   CREATININE 1.00 07/17/2017   BUN 14 07/17/2017   CO2 23 07/17/2017   TSH 3.780 07/13/2017   INR 1.33 07/15/2017   HGBA1C 5.6 07/14/2017      PT/INR:  Recent Labs    07/15/17 1242  LABPROT 16.4*  INR 1.33   Radiology: Dg Chest Port 1 View  Result Date: 07/18/2017 CLINICAL DATA:  Post CABG EXAM: PORTABLE CHEST 1 VIEW COMPARISON:  07/17/2017;  07/15/2017 FINDINGS: Grossly unchanged cardiac silhouette and mediastinal contours post median sternotomy and CABG. Interval removal of bilateral chest tubes/mediastinal drains. Stable positioning of remaining right jugular approach vascular sheath with tip projected over the confluence of the right internal jugular and innominate veins. No pneumothorax. Pulmonary is congestion without frank evidence of edema. Overall improved aeration of lung bases with persistent bibasilar opacities, likely atelectasis. Unchanged suspected trace bilateral effusions. Small amount of fluid is again seen tracking within the right minor fissure. Grossly unchanged bones. IMPRESSION: 1. Interval removal of bilateral mediastinal drains/chest tubes without development of pneumothorax. 2. Improved aeration of lungs with persistent findings of pulmonary venous congestion and bibasilar atelectasis. Electronically Signed   By: Sandi Mariscal M.D.   On: 07/18/2017 07:30     Assessment/Plan: S/P Procedure(s) (LRB): CORONARY ARTERY BYPASS GRAFTING (CABG) X 3 USING LEFT INTERNAL MAMMARY ARTERY AND RIGHT SAPHENOUS VEIN- ENDOSCOPICALLY HARVESTED (N/A) TRANSESOPHAGEAL ECHOCARDIOGRAM (TEE) (N/A)  1. CV-SR. On Lopressor 25 mg bid 2. Pulmonary-On 4 liters of oxygen via [place]. Wean to room air as tolerates. CXR this am shows no pneumothorax, pulmonary vascular congestion and bibasilar atelectasis. Encourage incentive spirometer and flutter valve. 3. Volume overload-on Lasix 40 mg IV bid. 4. ABL anemia-H and H yesterday 10.5 and 31. 5. CBGs -144/135/124. Pre op HGA1C 5.3. Will stop accu checks and SS PRN 6. Mild thrombocytopenia-platelets yesterday 121,000 7. Not on a statin so will start low dose Lipitor 8. Remove sleeve 9. Hope to transfer to 4E soon  Nani Skillern PA-C 07/18/2017 10:29 AM    Chart reviewed, patient examined, agree with above. She is progressing fairly well. Ambulating well according to nurse She can transfer to  4E and continue mobilization and IS.

## 2017-07-18 NOTE — Consult Note (Signed)
            [facility] CM Primary Care Navigator  07/18/2017  Gloria Mccoy 09/20/1945 [id number]   Attempt to see patient at the bedside to identify possible discharge needs but she was transferred from [place] [facility] to [place] [facility] (cardiac ICU) after surgery (CORONARY ARTERY BYPASS GRAFTING (CABG) X 3).  Will attempt to see patient at another time when out of ICU.   For additional questions please contact:  Edwena Felty A. Dionne Knoop, BSN, RN-BC [facility] PRIMARY CARE Navigator Cell: [phone]

## 2017-07-18 NOTE — Progress Notes (Signed)
At [place] came into patient's room because call light went off. Pt very upset saying she had been waiting to go to the bathroom.  RN offered to help pt to [facility] and pt still very upset saying "I don't want anymore medicine or anything." RN assisted pt back to bed.  Tech came in later at 0415 to get blood sugar and vitals and pt refused. Nurse tech returned to help pt call daughter but no one answered the phone.  At [place] came in to draw labs and attempt to weigh and walk pt. Pt wanted to call daughter so RN dialed the number and gave to pt who said "It went to voicemail but I don't believe it."  RN assisted pt to [facility] and to chair. Pt refused all labs and refused to be weighed and walked.  RN offered to get pt to chair and pt accepted. Pt now sitting in chair with call light and phone in lap.  Refusals of care have been documented. Vitals stable.  Will continue to monitor.

## 2017-07-19 ENCOUNTER — Inpatient Hospital Stay (HOSPITAL_COMMUNITY): Payer: Medicare HMO

## 2017-07-19 MED ORDER — LACTULOSE 10 GM/15ML PO SOLN
20.0000 g | Freq: Once | ORAL | Status: AC
  Administered 2017-07-19: 20 g via ORAL
  Filled 2017-07-19: qty 30

## 2017-07-19 NOTE — Consult Note (Signed)
[facility] CM Primary Care Navigator  07/19/2017  Gloria Mccoy 03-13-1946 [id number]   Met with patient and daughters(Gloria Mccoy and Gloria Mccoy)at the bedside to identify possible discharge needs.                      Patientreports havingworsening, persistent chest pain and shortness of breath that resultedto this admission (status post CORONARY ARTERY BYPASS GRAFTING- CABG x 3).  Patient endorses Dr. Redmond School with [facility] as herprimary care provider.   [facility] [place] and [facility] serviceto obtain medications without any problem so far.  Daughterverbalizedthat patient has been managing her own medications at homestraight out of the containers.  Patient mentioned that she was driving prior to admission. Her daughter- Patsywill be providingtransportation to herdoctors'appointments after discharge.   [employer] transportation benefits discussed with patient and daughters.  Daughter Gloria Mccoy) will be the primarycaregiver at home as stated.  Anticipated discharge plan ishome with home health services per therapy recommendation.  Patientand daughters voicedunderstanding tocall primary care provider's officewhen she returns homefor a post dischargevisitwithin1-2 weeksor sooner if needed.Patient letter (with PCP's contact number) was provided as a reminder.  Discussed with patient anddaughters regarding [facility] CM services available for health management at homebutdenied any healthissues or concernsat this time. Patient and daughtersexpressed understandingof needto seekreferral from primary care provider to [facility] care management ifdeemed necessary and appropriatefor anyservices in the future.   [facility] care management information was provided for future needs thatpatientmay have.  Patienthowever,opted andverbally agreed for EMMI calls to monitor her  recovery.  Referral made for EMMI General calls to follow-up after discharge.   For additional questions please contact:  Edwena Felty A. Casee Knepp, BSN, RN-BC [facility] PRIMARY CARE Navigator Cell: [phone]

## 2017-07-19 NOTE — Discharge Instructions (Signed)
Coronary Artery Bypass Grafting, Care After This sheet gives you information about how to care for yourself after your procedure. Your health care provider may also give you more specific instructions. If you have problems or questions, contact your health care provider. What can I expect after the procedure? After the procedure, it is common to have:  Nausea and a lack of appetite.  Constipation.  Weakness and fatigue.  Depression or irritability.  Pain or discomfort in your incision areas.  Follow these instructions at home: Medicines  Take over-the-counter and prescription medicines only as told by your health care provider. Do not stop taking medicines or start any new medicines without approval from your health care provider.  If you were prescribed an antibiotic medicine, take it as told by your health care provider. Do not stop taking the antibiotic even if you start to feel better.  Do not drive or use heavy machinery while taking prescription pain medicine. Incision care  Follow instructions from your health care provider about how to take care of your incisions. Make sure you: ? Wash your hands with soap and water before you change your bandage (dressing). If soap and water are not available, use hand sanitizer. ? Change your dressing as told by your health care provider. ? Leave stitches (sutures), skin glue, or adhesive strips in place. These skin closures may need to stay in place for 2 weeks or longer. If adhesive strip edges start to loosen and curl up, you may trim the loose edges. Do not remove adhesive strips completely unless your health care provider tells you to do that.  Keep incision areas clean, dry, and protected.  Check your incision areas every day for signs of infection. Check for: ? More redness, swelling, or pain. ? More fluid or blood. ? Warmth. ? Pus or a bad smell.  If incisions were made in your legs: ? Avoid crossing your legs. ? Avoid  sitting for long periods of time. Change positions every 30 minutes. ? Raise (elevate) your legs when you are sitting. Bathing  Do not take baths, swim, or use a hot tub until your health care provider approves.  Only take sponge baths. Pat the incisions dry. Do not rub incisions with a washcloth or towel.  Ask your health care provider when you can shower. Eating and drinking  Eat foods that are high in fiber, such as raw fruits and vegetables, whole grains, beans, and nuts. Meats should be lean cut. Avoid canned, processed, and fried foods. This can help prevent constipation and is a recommended part of a heart-healthy diet.  Drink enough fluid to keep your urine clear or pale yellow.  Limit alcohol intake to no more than 1 drink a day for nonpregnant women and 2 drinks a day for men. One drink equals 12 oz of beer, 5 oz of wine, or 1 oz of hard liquor. Activity  Rest and limit your activity as told by your health care provider. You may be instructed to: ? Stop any activity right away if you have chest pain, shortness of breath, irregular heartbeats, or dizziness. Get help right away if you have any of these symptoms. ? Move around frequently for short periods or take short walks as directed by your health care provider. Gradually increase your activities. You may need physical therapy or cardiac rehabilitation to help strengthen your muscles and build your endurance. ? Avoid lifting, pushing, or pulling anything that is heavier than 10 lb (4.5 kg) for at  least 6 weeks or as told by your health care provider. °· Do not drive until your health care provider approves. °· Ask your health care provider when you may return to work. °· Ask your health care provider when you may resume sexual activity. °General instructions °· Do not use any products that contain nicotine or tobacco, such as cigarettes and e-cigarettes. If you need help quitting, ask your health care provider. °· Take 2-3 deep  breaths every few hours during the day, while you recover. This helps expand your lungs and prevent complications like pneumonia after surgery. °· If you were given a device called an incentive spirometer, use it several times a day to practice deep breathing. Support your chest with a pillow or your arms when you take deep breaths or cough. °· Wear compression stockings as told by your health care provider. These stockings help to prevent blood clots and reduce swelling in your legs. °· Weigh yourself every day. This helps identify if your body is holding (retaining) fluid that may make your heart and lungs work harder. °· Keep all follow-up visits as told by your health care provider. This is important. °Contact a health care provider if: °· You have more redness, swelling, or pain around any incision. °· You have more fluid or blood coming from any incision. °· Any incision feels warm to the touch. °· You have pus or a bad smell coming from any incision °· You have a fever. °· You have swelling in your ankles or legs. °· You have pain in your legs. °· You gain 2 lb (0.9 kg) or more a day. °· You are nauseous or you vomit. °· You have diarrhea. °Get help right away if: °· You have chest pain that spreads to your jaw or arms. °· You are short of breath. °· You have a fast or irregular heartbeat. °· You notice a "clicking" in your breastbone (sternum) when you move. °· You have numbness or weakness in your arms or legs. °· You feel dizzy or light-headed. °Summary °· After the procedure, it is common to have pain or discomfort in the incision areas. °· Do not take baths, swim, or use a hot tub until your health care provider approves. °· Gradually increase your activities. You may need physical therapy or cardiac rehabilitation to help strengthen your muscles and build your endurance. °· Weigh yourself every day. This helps identify if your body is holding (retaining) fluid that may make your heart and lungs work  harder. °This information is not intended to replace advice given to you by your health care provider. Make sure you discuss any questions you have with your health care provider. °Document Released: 12/25/2004 Document Revised: 04/26/2016 Document Reviewed: 04/26/2016 °Elsevier Interactive Patient Education © 2018 [employer] Inc[employer] ° ° °Endoscopic Saphenous Vein Harvesting, Care After °Refer to this sheet in the next few weeks. These instructions provide you with information about caring for yourself after your procedure. Your health care provider may also give you more specific instructions. Your treatment has been planned according to current medical practices, but problems sometimes occur. Call your health care provider if you have any problems or questions after your procedure. °What can I expect after the procedure? °After the procedure, it is common to have: °· Pain. °· Bruising. °· Swelling. °· Numbness. ° °Follow these instructions at home: °Medicine °· Take over-the-counter and prescription medicines only as told by your health care provider. °· Do not drive or operate heavy machinery   while taking prescription pain medicine. °Incision care ° °· Follow instructions from your health care provider about how to take care of the cut made during surgery (incision). Make sure you: °? Wash your hands with soap and water before you change your bandage (dressing). If soap and water are not available, use hand sanitizer. °? Change your dressing as told by your health care provider. °? Leave stitches (sutures), skin glue, or adhesive strips in place. These skin closures may need to be in place for 2 weeks or longer. If adhesive strip edges start to loosen and curl up, you may trim the loose edges. Do not remove adhesive strips completely unless your health care provider tells you to do that. °· Check your incision area every day for signs of infection. Check for: °? More redness, swelling, or pain. °? More fluid or  blood. °? Warmth. °? Pus or a bad smell. °General instructions °· Raise (elevate) your legs above the level of your heart while you are sitting or lying down. °· Do any exercises your health care providers have given you. These may include deep breathing, coughing, and walking exercises. °· Do not shower, take baths, swim, or use a hot tub unless told by your health care provider. °· Wear your elastic stocking if told by your health care provider. °· Keep all follow-up visits as told by your health care provider. This is important. °Contact a health care provider if: °· Medicine does not help your pain. °· Your pain gets worse. °· You have new leg bruises or your leg bruises get bigger. °· You have a fever. °· Your leg feels numb. °· You have more redness, swelling, or pain around your incision. °· You have more fluid or blood coming from your incision. °· Your incision feels warm to the touch. °· You have pus or a bad smell coming from your incision. °Get help right away if: °· Your pain is severe. °· You develop pain, tenderness, warmth, redness, or swelling in any part of your leg. °· You have chest pain. °· You have trouble breathing. °This information is not intended to replace advice given to you by your health care provider. Make sure you discuss any questions you have with your health care provider. °Document Released: 02/17/2011 Document Revised: 11/13/2015 Document Reviewed: 04/21/2015 °Elsevier Interactive Patient Education © 2018 Elsevier Inc. ° ° °

## 2017-07-19 NOTE — Progress Notes (Signed)
Transfer from [place] with RN, S/P CABG x3, V/S checked patient oriented to the unit,CHG bathed done,attached to telemetry monitoring and CCMD notified. Call bell within patient reach.

## 2017-07-19 NOTE — Discharge Summary (Signed)
Physician Discharge Summary  Patient ID: Gloria Mccoy MRN: [id number] DOB/AGE: 12-26-1945 72 y.o.  Admit date: 07/13/2017 Discharge date: 07/20/2017  Admission Diagnoses:  Patient Active Problem List   Diagnosis Date Noted  . Coronary artery disease 07/15/2017  . Unstable angina ([place]) 07/13/2017  . Essential hypertension 07/13/2017  . Obesity 07/13/2017  . Hyperglycemia 07/13/2017  . Hypothyroid 01/06/2012  . Meniere disease   . Arthritis    Discharge Diagnoses:   Patient Active Problem List   Diagnosis Date Noted  . S/P CABG x 3 07/15/2017  . Coronary artery disease 07/15/2017  . Unstable angina ([place]) 07/13/2017  . Essential hypertension 07/13/2017  . Obesity 07/13/2017  . Hyperglycemia 07/13/2017  . Hypothyroid 01/06/2012  . Meniere disease   . Arthritis    Discharged Condition: good  History of Present Illness:  Gloria Mccoy is a 10 obese female with known history of nicotine abuse, hypertension, hypothyroidism, and arthritis.  The patient developed complaints of chest pain with exertion about 3 weeks ago.  These episodes were associated with shortness of breath.  She developed a severe episode of taking out her trash.  This consisted of deep pain in the chest with shortness of breath, nausea, and diaphoresis.  Due to this she presented to the ED.  Workup showed mild elevation in her Troponin level.  EKG had some T wave inversions.  She was taken to the catheterization lab and found to have severe vessel CAD.  It was felt coronary bypass grafting would be indicated and she was admitted for further care.  Hospital Course:   The patient was chest pain free during admission.  She was evaluated by Dr. Roxan Hockey who felt coronary bypass grafting would be indicated.  The risks and benefits of the procedure were explained to the patient and she was agreeable to proceed.  She was taken to the operating room on 07/14/2017.  She underwent CABG x 3 utilizing LIMA to LAD, SVG to  diagonal, and SVG to Ramus Intermediate.  She also underwent endoscopic harvest of greater saphenous vein from her right thigh.  She tolerated the procedure without difficulty and was taken to the SICU in stable condition.  During her stay in the SICU the patient was weaned and extubated.  Her chest tubes and arterial lines were removed without difficulty.  She was given diuretics (lasix and metolazone) for mild hypervolemia.  She was maintaining NSR and stable for transfer to the telemetry unit on 07/18/2017.  She continues to progress.  She remained volume overloaded and was started on Metolazone in addition to Lasix.  She has been weaned down to 1 liter of oxygen as of the morning of 01/30. Nurse was able to wean her to room air.  She did briefly desat to 88% with ambulation but quickly improved with deep breathing to the low 90's. She continued to maintain NSR and her pacing wires were removed without difficulty.  She is ambulating with assistance and home health and PT have been arranged.  Significant Diagnostic Studies: cardiac graphics:    Mid LM lesion is 25% stenosed.  Dist LM to Ost LAD lesion is 80% stenosed.  Ost Cx to Prox Cx lesion is 30% stenosed.  Ost 1st Mrg lesion is 50% stenosed.  Prox LAD lesion is 90% stenosed.  The left ventricular systolic function is normal.  LV end diastolic pressure is normal.  The left ventricular ejection fraction is 55-65% by visual estimate.   1. Single vessel obstructive CAD. Patient has  complex ostial and proximal to mid LAD disease that involves the origin of the ramus intermediate and first diagonal respectively. 2. Normal LV function 3. Normal LVEDP  Treatments: surgery:   Median sternotomy, extracorporeal circulation, coronary artery bypass grafting x3 (left internal mammary artery to left anterior descending, saphenous vein graft to first diagonal, saphenous vein graft to ramus intermedius), endoscopic vein harvest of right thigh on  07/14/2017.  Disposition: 01-Home or Self Care   Discharge medications:   Allergies as of 07/20/2017      Reactions   Sulfa Antibiotics Shortness Of Breath   Sulfasalazine Shortness Of Breath      Medication List    STOP taking these medications   aspirin 81 MG tablet Replaced by:  aspirin 325 MG EC tablet   naproxen sodium 220 MG tablet Commonly known as:  ALEVE     TAKE these medications   acetaminophen 325 MG tablet Commonly known as:  TYLENOL Take 2 tablets (650 mg total) by mouth every 6 (six) hours as needed for mild pain.   aspirin 325 MG EC tablet Take 1 tablet (325 mg total) by mouth daily. Replaces:  aspirin 81 MG tablet   atorvastatin 10 MG tablet Commonly known as:  LIPITOR Take 1 tablet (10 mg total) by mouth daily at 6 PM.   fish oil-omega-3 fatty acids 1000 MG capsule Take 1 g by mouth daily.   furosemide 40 MG tablet Commonly known as:  LASIX Take 1 tablet (40 mg total) by mouth daily. For one week then stop.   levothyroxine 75 MCG tablet Commonly known as:  SYNTHROID, LEVOTHROID Take 75 mcg by mouth daily.   metoprolol tartrate 25 MG tablet Commonly known as:  LOPRESSOR Take 1 tablet (25 mg total) by mouth 2 (two) times daily.   potassium chloride SA 20 MEQ tablet Commonly known as:  K-DUR,KLOR-CON Take 1 tablet (20 mEq total) by mouth daily. For one week then stop.   traMADol 50 MG tablet Commonly known as:  ULTRAM Take 1 tablet (50 mg total) by mouth every 4 (four) hours as needed for moderate pain.   VITAMIN B-12 PO Take 1 tablet by mouth daily.   VITAMIN D PO Take 1 tablet by mouth daily.   VITAMIN E PO Take 1 tablet by mouth daily.       The patient has been discharged on:   1.Beta Blocker:  Yes [ x  ]                              No   [   ]                              If No, reason:  2.Ace Inhibitor/ARB: Yes [   ]                                     No  [ x   ]                                     If No, reason:BP  labile  3.Statin:   Yes [ x  ]  No  [   ]                  If No, reason:  4.Ecasa:  Yes  [x   ]                  No   [   ]                  If No, reason:    Follow-up Information    Melrose Nakayama, MD Follow up on 08/16/2017.   Specialty:  Cardiothoracic Surgery Why:  Appointment is at 12:45, please get CXR at 12:15 at [facility] located on first floor of our office building Contact information: [place] [address] [phone]        [place], [place], [place]. Go on 08/02/2017.   Specialties:  Physician Assistant, Cardiology Why:  Appointment time is at 3:30 pm Contact information: [place] [address] [place] Follow up on 07/27/2017.   Why:  Please remove chest tube sutures on 07/27/2017.          Signed: Nani Skillern PA-C 07/20/2017, 7:27 AM

## 2017-07-19 NOTE — Progress Notes (Signed)
      [address]       [place] [address]             [phone]        4 Days Post-Op Procedure(s) (LRB): CORONARY ARTERY BYPASS GRAFTING (CABG) X 3 USING LEFT INTERNAL MAMMARY ARTERY AND RIGHT SAPHENOUS VEIN- ENDOSCOPICALLY HARVESTED (N/A) TRANSESOPHAGEAL ECHOCARDIOGRAM (TEE) (N/A)  Subjective: Patient has not had a bowel movement yet.  Objective: Vital signs in last 24 hours: Temp:  [98.5 F (36.9 C)-99.8 F (37.7 C)] 98.5 F (36.9 C) (01/29 0410) Pulse Rate:  [68-77] 68 (01/29 0410) Cardiac Rhythm: Normal sinus rhythm (01/29 0700) Resp:  [13-21] 20 (01/29 0410) BP: (97-138)/(55-65) 110/58 (01/29 0410) SpO2:  [89 %-100 %] 90 % (01/29 0410) Weight:  [198 lb 3.2 oz (89.9 kg)-202 lb (91.6 kg)] 202 lb (91.6 kg) (01/29 0410)  Pre op weight 89.9 kg Current Weight  07/19/17 202 lb (91.6 kg)      Intake/Output from previous day: 01/28 0701 - 01/29 0700 In: 1200 [P.O.:1200] Out: 1200 [Urine:1200]   Physical Exam:  Cardiovascular: RRR Pulmonary: Crackles  Abdomen: Soft, obese,non tender, bowel sounds present. Extremities: Bilateral lower extremity edema. Wounds: RLE wound is clean and dry.  No erythema or signs of infection. Proximal sternal wound with slight bloody ooze.  Lab Results: CBC: Recent Labs    07/17/17 0509 07/17/17 1611 07/18/17 1143  WBC 18.0*  --  14.4*  HGB 10.0* 10.5* 10.0*  HCT 28.7* 31.0* 28.2*  PLT 121*  --  135*   BMET:  Recent Labs    07/17/17 0509 07/17/17 1611 07/18/17 1143  NA 135 136 135  K 4.1 3.7 3.8  CL 102 96* 100*  CO2 23  --  26  GLUCOSE 113* 129* 124*  BUN 13 14 11   CREATININE 1.26* 1.00 0.91  CALCIUM 8.1*  --  8.3*    PT/INR:  Lab Results  Component Value Date   INR 1.33 07/15/2017   INR 0.97 07/13/2017   ABG:  INR: Will add last result for INR, ABG once components are confirmed Will add last 4 CBG results once components are confirmed  Assessment/Plan: 1. CV-SR in the 80's. On  Lopressor 25 mg bid 2. Pulmonary-On 4 liters of oxygen via [place]. Wean to room air as tolerates. CXR this am appears to show no pneumothorax, pulmonary vascular congestion and bibasilar atelectasis and pleural effusions. Encourage incentive spirometer and flutter valve. 3. Volume overload-on Lasix 40 mg po bid and Metolazone 5 mg daily. 4. ABL anemia-H and H yesterday 10 and 28.2. 5. Mild thrombocytopenia-last platelets increased to 135,000 6. Remove EPW 7. LOC constipation 8. Home with HH,PT once oxygen requirements decreased  [place] Harold M ZimmermanPA-C 07/19/2017,7:12 AM

## 2017-07-19 NOTE — Progress Notes (Signed)
Gloria Mccoy has walked x 2 today with no difficulty.  Walks in room to bathroom and chair with no difficulty.  States she has pain in her chest with coughing only, and had headache today which was resolved with tylenol.  Appetite good, taking po fluids adequately.  Family at bedside all day and attentive to her needs.

## 2017-07-19 NOTE — Progress Notes (Signed)
CARDIAC REHAB PHASE I   PRE:  Rate/Rhythm: 79 SR  BP:  Supine:   Sitting: 139/64  Standing:    SaO2: 100% 3L   80%RA  MODE:  Ambulation: 470 ft   POST:  Rate/Rhythm: 99 SR  BP:  Supine: 131/57  Sitting:   Standing:    SaO2: 96% 3L hall and 99% 3L room [phone] Assisted pt to bathroom twice. Once before walk and then after walk. Pt walked 470 ft on 3L with rolling walker and minimal asst. Pt has slow pace and stopped halfway for standing rest break. Tried pt on RA to bathroom and desat to 80 % so walked pt on 3L.  Checked sat during walk and 96% on 3L. To bed for  pacing wire removal. Encouraged IS.   Graylon Good, RN BSN  07/19/2017 10:22 AM

## 2017-07-19 NOTE — Progress Notes (Signed)
Removed epicardial wires per order. 4 intact.  Pt tolerated procedure well.  Pt instructed to remain on bedrest for one hour.  Frequent vitals will be taken and documented. Pt resting with call bell within reach. Payton Emerald, RN

## 2017-07-19 NOTE — Progress Notes (Signed)
Physical Therapy Treatment Patient Details Name: Gloria Mccoy MRN: [id number] DOB: 04-Aug-1945 Today's Date: 07/19/2017    History of Present Illness Pt adm for chest pain. Now s/p CABG x 3 on 07/14/17. PMH - HTN, arthritis, obesity, Meniere's disease    PT Comments    No c/o pain.  Session focus on advancing functional mobility.  Pt currently performing all mobility with supervision or better.  Gait within room for toileting (pt completes 3/3 toileting steps set up level).  Gait within hospital up to 400' with RW (light grip on walker for sternal precautions).  Pt returned to room at end of session and positioned sitting EOB with family present.      Follow Up Recommendations  Home health PT     Equipment Recommendations  (rollator)    Recommendations for Other Services       Precautions / Restrictions Precautions Precautions: Sternal Restrictions Weight Bearing Restrictions: Yes Other Position/Activity Restrictions: sternal precautions    Mobility  Bed Mobility   Bed Mobility: Supine to Sit     Supine to sit: Supervision;HOB elevated        Transfers Overall transfer level: Needs assistance Equipment used: Rolling walker (2 wheeled) Transfers: Sit to/from Stand Sit to Stand: Supervision         General transfer comment: Cues for sternal precautions  Ambulation/Gait Ambulation/Gait assistance: Supervision;Min guard Ambulation Distance (Feet): 400 Feet Assistive device: Rolling walker (2 wheeled) Gait Pattern/deviations: Step-through pattern;Decreased stride length Gait velocity: slow       Stairs            Wheelchair Mobility    Modified Rankin (Stroke Patients Only)       Balance                                            Cognition Arousal/Alertness: Awake/alert Behavior During Therapy: WFL for tasks assessed/performed Overall Cognitive Status: Within Functional Limits for tasks assessed                                         Exercises      General Comments        Pertinent Vitals/Pain Pain Assessment: No/denies pain    Home Living                      Prior Function            PT Goals (current goals can now be found in the care plan section) Acute Rehab PT Goals Patient Stated Goal: return home PT Goal Formulation: With patient Time For Goal Achievement: 07/25/17 Potential to Achieve Goals: Good Progress towards PT goals: Progressing toward goals    Frequency    Min 3X/week      PT Plan Current plan remains appropriate    Co-evaluation              AM-PAC PT "6 Clicks" Daily Activity  Outcome Measure  Difficulty turning over in bed (including adjusting bedclothes, sheets and blankets)?: None Difficulty moving from lying on back to sitting on the side of the bed? : None Difficulty sitting down on and standing up from a chair with arms (e.g., wheelchair, bedside commode, etc,.)?: A Little Help needed moving to and from a bed to  chair (including a wheelchair)?: A Little Help needed walking in hospital room?: A Little Help needed climbing 3-5 steps with a railing? : A Lot 6 Click Score: 19    End of Session Equipment Utilized During Treatment: Oxygen;Gait belt Activity Tolerance: Patient tolerated treatment well Patient left: in bed;with call bell/phone within reach;with family/visitor present Nurse Communication: Mobility status PT Visit Diagnosis: Other abnormalities of gait and mobility (R26.89)     Time: 3254-9826 PT Time Calculation (min) (ACUTE ONLY): 12 min  Charges:  $Gait Training: 8-22 mins                    G Codes:        Michel Santee 07/19/2017, 4:02 PM

## 2017-07-20 MED ORDER — METOPROLOL TARTRATE 25 MG PO TABS: 25.0000 mg | ORAL_TABLET | Freq: Two times a day (BID) | ORAL | 1 refills | Status: DC

## 2017-07-20 MED ORDER — POTASSIUM CHLORIDE CRYS ER 20 MEQ PO TBCR: 20.0000 meq | EXTENDED_RELEASE_TABLET | Freq: Every day | ORAL | 0 refills | Status: DC

## 2017-07-20 MED ORDER — TRAMADOL HCL 50 MG PO TABS: 50.0000 mg | ORAL_TABLET | ORAL | 0 refills | Status: DC | PRN

## 2017-07-20 MED ORDER — ATORVASTATIN CALCIUM 10 MG PO TABS: 10.0000 mg | ORAL_TABLET | Freq: Every day | ORAL | 1 refills | Status: DC

## 2017-07-20 MED ORDER — FUROSEMIDE 40 MG PO TABS: 40.0000 mg | ORAL_TABLET | Freq: Every day | ORAL | 0 refills | Status: DC

## 2017-07-20 MED ORDER — ASPIRIN 325 MG PO TBEC: 325.0000 mg | DELAYED_RELEASE_TABLET | Freq: Every day | ORAL | 0 refills | Status: DC

## 2017-07-20 MED ORDER — ACETAMINOPHEN 325 MG PO TABS: 650.0000 mg | ORAL_TABLET | Freq: Four times a day (QID) | ORAL | Status: DC | PRN

## 2017-07-20 MED ORDER — POTASSIUM CHLORIDE CRYS ER 20 MEQ PO TBCR
20.0000 meq | EXTENDED_RELEASE_TABLET | Freq: Every day | ORAL | Status: DC
  Administered 2017-07-20: 20 meq via ORAL
  Filled 2017-07-20: qty 1

## 2017-07-20 NOTE — Progress Notes (Signed)
Ambulated in hallway with RN about 700 feet using front wheel walker with O2 at 1 LPM/[place] tolerated well.O2 saturation post ambulation is 92%.Back to bed ,call bell within reach.

## 2017-07-20 NOTE — Care Management Note (Signed)
Case Management Note Marvetta Gibbons RN, BSN Unit 4E-Case Manager [phone]  Patient Details  Name: Gloria Mccoy MRN: [id number] Date of Birth: 1946/01/21  Subjective/Objective:  Pt admitted with [place] s/p CABG x3                  Action/Plan: PTA pt lived at home- plan to return home has two daughters to assist- orders placed for [facility] and DME needs- in to speak with pt and daughters at the bedside- pt has been weaned off 02- will not need home 02- PA to cancel order- per conversation pt will need 3n1 and RW for home- choice offered for DME and HH needs- per daughter they would like to use [facility] for services- notified Butch Penny with [facility] of DME needs- RW and 3n1 to be delivered to room prior to discharge- referral for [facility] services accepted by Butch Penny.   Expected Discharge Date:  07/20/17               Expected Discharge Plan:  [place]  In-House Referral:  NA  Discharge planning Services  CM Consult  Post Acute Care Choice:  Durable Medical Equipment, Home Health Choice offered to:  Patient, Adult Children  DME Arranged:  3-N-1, Walker rolling DME Agency:  [place] Arranged:  RN, PT [facility] Agency:  [place]  Status of Service:  Completed, signed off  If discussed at [place] of Stay Meetings, dates discussed:    Discharge Disposition: home/home health.    Additional Comments:  Dawayne Patricia, RN 07/20/2017, 2:01 PM

## 2017-07-20 NOTE — Progress Notes (Signed)
Order received to discharge patient.  Telemetry monitor removed and CCMD notified.  PIV access removed.  Discharge instructions, follow up, medications and instructions for their use discussed with patient and family. 

## 2017-07-20 NOTE — Progress Notes (Signed)
SATURATION QUALIFICATIONS: (This note is used to comply with regulatory documentation for home oxygen)  Patient Saturations on Room Air at Rest = 96%  Patient Saturations on Room Air while Ambulating = 87-90%  Patient Saturations on 0 Liters of oxygen while Ambulating = 87-90%  Please briefly explain why patient needs home oxygen:

## 2017-07-20 NOTE — Progress Notes (Addendum)
CARDIAC REHAB PHASE I   PRE:  Rate/Rhythm: 83 SR  BP:  Supine:   Sitting: 113/68  Standing:    SaO2: 95 1/2 L 93 RA  MODE:  Ambulation: 470 ft   POST:  Rate/Rhythm: 102 ST  BP:  Supine:   Sitting: 118/71  Standing:    SaO2: 87-90 RA during walk after 92-93 RA 1040-1200 Assisted X1 and used walker to ambulate. Gait steady with walker, slow pace. Pt able to walk 470 feet. She c/o of some back pain after walk and ask for pain medication. Room air sat during walk 87-90%. After walk in recliner 92-93%. Instructed pt that she needs to use IS hourly. She has poor effort with using IS only up to 600. Completed discharge education with pt and daughters. We discussed sternal precautions, use of IS, showers, exercise guidelines and Outpt. CRP. They voice understanding. Pt does dip snuff. I discussed cessation with her and gave her cessation information.Will send referral to Outpt. [facility] in [place]. May also benefit from using flutter value too. Daughters are asking for walker and 3 in one [facility] and shower chair. Will discuss with case manager and report room air sats. Rodney Langton RN 07/20/2017 12:00 PM

## 2017-07-20 NOTE — Progress Notes (Addendum)
She is on room air with oxygen saturation in the low 90's.. Her oxygenation briefly decreases with exertion (88%) but quickly returns to low 90%. She will not require home oxygen.Also, BP just rechecked and it was 100/61. HH and PT already arranged. Patient wishes to go home today.

## 2017-07-20 NOTE — Progress Notes (Addendum)
      [address]       [place] [address]             [phone]        5 Days Post-Op Procedure(s) (LRB): CORONARY ARTERY BYPASS GRAFTING (CABG) X 3 USING LEFT INTERNAL MAMMARY ARTERY AND RIGHT SAPHENOUS VEIN- ENDOSCOPICALLY HARVESTED (N/A) TRANSESOPHAGEAL ECHOCARDIOGRAM (TEE) (N/A)  Subjective: Patient had a bowel movement yesterday.  Objective: Vital signs in last 24 hours: Temp:  [98.6 F (37 C)-99.5 F (37.5 C)] 98.6 F (37 C) (01/30 0407) Pulse Rate:  [76-89] 83 (01/30 0407) Cardiac Rhythm: Normal sinus rhythm (01/30 0001) Resp:  [16-24] 20 (01/30 0407) BP: (104-131)/(50-83) 104/64 (01/30 0407) SpO2:  [96 %-100 %] 100 % (01/30 0407) Weight:  [202 lb (91.6 kg)] 202 lb (91.6 kg) (01/30 0359)  Pre op weight 89.9 kg Current Weight  07/20/17 202 lb (91.6 kg)      Intake/Output from previous day: 01/29 0701 - 01/30 0700 In: 270 [P.O.:840; I.V.:3] Out: 3150 [Urine:3150]   Physical Exam:  Cardiovascular: RRR Pulmonary: Slightly diminished at bases Abdomen: Soft, obese,non tender, bowel sounds present. Extremities: Bilateral lower extremity edema. Wounds: RLE and sternal wounds are clean and dry.  No erythema or signs of infection. Dressing on proximal sternal wound is clean and dry.  Lab Results: CBC: Recent Labs    07/17/17 1611 07/18/17 1143  WBC  --  14.4*  HGB 10.5* 10.0*  HCT 31.0* 28.2*  PLT  --  135*   BMET:  Recent Labs    07/17/17 1611 07/18/17 1143  NA 136 135  K 3.7 3.8  CL 96* 100*  CO2  --  26  GLUCOSE 129* 124*  BUN 14 11  CREATININE 1.00 0.91  CALCIUM  --  8.3*    PT/INR:  Lab Results  Component Value Date   INR 1.33 07/15/2017   INR 0.97 07/13/2017   ABG:  INR: Will add last result for INR, ABG once components are confirmed Will add last 4 CBG results once components are confirmed  Assessment/Plan: 1. CV-SR in the 80's. On Lopressor 25 mg bid 2. Pulmonary-On 1 liter of oxygen via [place]. Wean to room  air as tolerates. Encourage incentive spirometer and flutter valve. She will need Lasix at discharge 3. Volume overload-on Lasix 40 mg po daily and Metolazone 5 mg daily. 4. ABL anemia-Last H and H10 and 28.2. 5. Mild thrombocytopenia-last platelets increased to 135,000 6. Chest tube sutures to remain. Will ask HH  To remove  7. Home with HH,PT has been arranged. She is down to 1 liter of oxygen via [place]. Hope to discharge later today but will discuss with surgeon.  Donielle M ZimmermanPA-C 07/20/2017,7:08 AM   Chart reviewed, patient examined, agree with above. Says she is a little nauseous this am but ate some. Bowels working. Good diuresis Still on 1L oxygen and has atelectasis in her bases. Needs to work more on IS and flutter. Possibly home later today if oxygen weaned off.

## 2017-07-21 DIAGNOSIS — R739 Hyperglycemia, unspecified: Secondary | ICD-10-CM | POA: Diagnosis not present

## 2017-07-21 DIAGNOSIS — E669 Obesity, unspecified: Secondary | ICD-10-CM | POA: Diagnosis not present

## 2017-07-21 DIAGNOSIS — Z48812 Encounter for surgical aftercare following surgery on the circulatory system: Secondary | ICD-10-CM | POA: Diagnosis not present

## 2017-07-21 DIAGNOSIS — I25110 Atherosclerotic heart disease of native coronary artery with unstable angina pectoris: Secondary | ICD-10-CM | POA: Diagnosis not present

## 2017-07-21 DIAGNOSIS — I10 Essential (primary) hypertension: Secondary | ICD-10-CM | POA: Diagnosis not present

## 2017-07-21 DIAGNOSIS — H8109 Meniere's disease, unspecified ear: Secondary | ICD-10-CM | POA: Diagnosis not present

## 2017-07-21 DIAGNOSIS — F17290 Nicotine dependence, other tobacco product, uncomplicated: Secondary | ICD-10-CM | POA: Diagnosis not present

## 2017-07-21 DIAGNOSIS — E039 Hypothyroidism, unspecified: Secondary | ICD-10-CM | POA: Diagnosis not present

## 2017-07-21 DIAGNOSIS — M1990 Unspecified osteoarthritis, unspecified site: Secondary | ICD-10-CM | POA: Diagnosis not present

## 2017-07-21 MED FILL — Heparin Sodium (Porcine) Inj 1000 Unit/ML: INTRAMUSCULAR | Qty: 20 | Status: AC

## 2017-07-21 MED FILL — Sodium Chloride IV Soln 0.9%: INTRAVENOUS | Qty: 2000 | Status: AC

## 2017-07-21 MED FILL — Sodium Bicarbonate IV Soln 8.4%: INTRAVENOUS | Qty: 50 | Status: AC

## 2017-07-21 MED FILL — Mannitol IV Soln 20%: INTRAVENOUS | Qty: 500 | Status: AC

## 2017-07-21 MED FILL — Electrolyte-R (PH 7.4) Solution: INTRAVENOUS | Qty: 4000 | Status: AC

## 2017-07-21 MED FILL — Lidocaine HCl IV Inj 20 MG/ML: INTRAVENOUS | Qty: 5 | Status: AC

## 2017-07-25 DIAGNOSIS — M1990 Unspecified osteoarthritis, unspecified site: Secondary | ICD-10-CM | POA: Diagnosis not present

## 2017-07-25 DIAGNOSIS — H8109 Meniere's disease, unspecified ear: Secondary | ICD-10-CM | POA: Diagnosis not present

## 2017-07-25 DIAGNOSIS — I25110 Atherosclerotic heart disease of native coronary artery with unstable angina pectoris: Secondary | ICD-10-CM | POA: Diagnosis not present

## 2017-07-25 DIAGNOSIS — Z48812 Encounter for surgical aftercare following surgery on the circulatory system: Secondary | ICD-10-CM | POA: Diagnosis not present

## 2017-07-25 DIAGNOSIS — R739 Hyperglycemia, unspecified: Secondary | ICD-10-CM | POA: Diagnosis not present

## 2017-07-25 DIAGNOSIS — E669 Obesity, unspecified: Secondary | ICD-10-CM | POA: Diagnosis not present

## 2017-07-25 DIAGNOSIS — F17290 Nicotine dependence, other tobacco product, uncomplicated: Secondary | ICD-10-CM | POA: Diagnosis not present

## 2017-07-25 DIAGNOSIS — E039 Hypothyroidism, unspecified: Secondary | ICD-10-CM | POA: Diagnosis not present

## 2017-07-25 DIAGNOSIS — I10 Essential (primary) hypertension: Secondary | ICD-10-CM | POA: Diagnosis not present

## 2017-07-26 DIAGNOSIS — I2510 Atherosclerotic heart disease of native coronary artery without angina pectoris: Secondary | ICD-10-CM | POA: Diagnosis not present

## 2017-07-26 DIAGNOSIS — Z6832 Body mass index (BMI) 32.0-32.9, adult: Secondary | ICD-10-CM | POA: Diagnosis not present

## 2017-07-26 DIAGNOSIS — I25810 Atherosclerosis of coronary artery bypass graft(s) without angina pectoris: Secondary | ICD-10-CM | POA: Diagnosis not present

## 2017-07-26 DIAGNOSIS — Z1389 Encounter for screening for other disorder: Secondary | ICD-10-CM | POA: Diagnosis not present

## 2017-07-26 DIAGNOSIS — E6609 Other obesity due to excess calories: Secondary | ICD-10-CM | POA: Diagnosis not present

## 2017-07-27 DIAGNOSIS — F17290 Nicotine dependence, other tobacco product, uncomplicated: Secondary | ICD-10-CM | POA: Diagnosis not present

## 2017-07-27 DIAGNOSIS — R739 Hyperglycemia, unspecified: Secondary | ICD-10-CM | POA: Diagnosis not present

## 2017-07-27 DIAGNOSIS — M1990 Unspecified osteoarthritis, unspecified site: Secondary | ICD-10-CM | POA: Diagnosis not present

## 2017-07-27 DIAGNOSIS — H8109 Meniere's disease, unspecified ear: Secondary | ICD-10-CM | POA: Diagnosis not present

## 2017-07-27 DIAGNOSIS — E039 Hypothyroidism, unspecified: Secondary | ICD-10-CM | POA: Diagnosis not present

## 2017-07-27 DIAGNOSIS — I10 Essential (primary) hypertension: Secondary | ICD-10-CM | POA: Diagnosis not present

## 2017-07-27 DIAGNOSIS — E669 Obesity, unspecified: Secondary | ICD-10-CM | POA: Diagnosis not present

## 2017-07-27 DIAGNOSIS — Z48812 Encounter for surgical aftercare following surgery on the circulatory system: Secondary | ICD-10-CM | POA: Diagnosis not present

## 2017-07-27 DIAGNOSIS — I25110 Atherosclerotic heart disease of native coronary artery with unstable angina pectoris: Secondary | ICD-10-CM | POA: Diagnosis not present

## 2017-08-01 DIAGNOSIS — F17290 Nicotine dependence, other tobacco product, uncomplicated: Secondary | ICD-10-CM | POA: Diagnosis not present

## 2017-08-01 DIAGNOSIS — E669 Obesity, unspecified: Secondary | ICD-10-CM | POA: Diagnosis not present

## 2017-08-01 DIAGNOSIS — I25110 Atherosclerotic heart disease of native coronary artery with unstable angina pectoris: Secondary | ICD-10-CM | POA: Diagnosis not present

## 2017-08-01 DIAGNOSIS — H8109 Meniere's disease, unspecified ear: Secondary | ICD-10-CM | POA: Diagnosis not present

## 2017-08-01 DIAGNOSIS — Z48812 Encounter for surgical aftercare following surgery on the circulatory system: Secondary | ICD-10-CM | POA: Diagnosis not present

## 2017-08-01 DIAGNOSIS — I10 Essential (primary) hypertension: Secondary | ICD-10-CM | POA: Diagnosis not present

## 2017-08-01 DIAGNOSIS — R739 Hyperglycemia, unspecified: Secondary | ICD-10-CM | POA: Diagnosis not present

## 2017-08-01 DIAGNOSIS — E039 Hypothyroidism, unspecified: Secondary | ICD-10-CM | POA: Diagnosis not present

## 2017-08-01 DIAGNOSIS — M1990 Unspecified osteoarthritis, unspecified site: Secondary | ICD-10-CM | POA: Diagnosis not present

## 2017-08-01 NOTE — Progress Notes (Signed)
Cardiology Office Note    Date:  08/02/2017   ID:  Gloria Mccoy, DOB [birth year], MRN [id number]  PCP:  Redmond School, MD  Cardiologist: Dr. Harl Bowie   Chief Complaint  Patient presents with  . Hospitalization Follow-up    s/p CABG    History of Present Illness:    Gloria Mccoy is a 72 y.o. female with past medical history of HTN, Hypothyroidism, arthritis, and tobacco use who presents to the office today for hospital follow-up.   She recently presented to [facility] ED on 07/13/2017 for new onset exertional chest discomfort which have been occurring for the past 3 weeks.  Initial troponin value was flat at 0.03 but her EKG showed T wave inversion along the inferior lateral leads.  With her presenting symptoms concerning for unstable angina, she was transferred to [facility] for further evaluation. Her cardiac catheterization was performed that same day and showed single vessel obstructive CAD with 80% distal left main, 90% proximal LAD, 50% ostial first marginal, and 30% ostial circumflex stenosis.  Due to the complexity of her lesions and bifurcation LAD disease, CT Surgery consult for consideration of CABG was recommended. Was evaluated by Dr. Roxan Hockey  And underwent CABG x3 with LIMA-LAD, SVG-D1, and SVG-RI.  No immediate complications were noted and she was weaned and extubated appropriately after surgery. She did have mild hypervolemia afterwards and was treated with IV Lasix and Metolazone. She maintained normal sinus rhythm and no episodes of atrial fibrillation were noted. She was discharged on 07/19/2017 with a discharge weight of 202 lbs. Was placed on ASA 325 mg daily, Atorvastatin 10 mg daily, Lasix 40 mg daily (for 1 week), and Lopressor 25mg  BID.   In talking with the patient today, she reports overall doing well since her recent CABG. She does continue to have some pain along her sternal incision which is relieved with Tylenol. Denies any exertional chest pain or  dyspnea on exertion. No recent orthopnea, PND, or lower extremity edema. Has quit using smokeless tobacco since her recent admission.   She is working with physical therapy three times per week and feels like this is helping with her strength. Vitals are checked at the time of her visits and she reports her blood pressure has been well controlled.   Past Medical History:  Diagnosis Date  . Arthritis    "hands sometimes" (07/13/2017)  . Coronary artery disease    a. s/p CABG x3 in 06/2017 with LIMA-LAD, SVG-D1, and SVG-RI.    Marland Kitchen Dips tobacco   . Essential hypertension   . Hypothyroid   . Meniere disease   . Obesity   . Osteopenia 01/2012   T score -1.3 FRAX 7.9%/0.6%    Past Surgical History:  Procedure Laterality Date  . CARDIAC CATHETERIZATION  07/13/2017  . CATARACT EXTRACTION W/PHACO Right 01/28/2015   Procedure: CATARACT EXTRACTION PHACO AND INTRAOCULAR LENS PLACEMENT :  CDE:  5.70;  Surgeon: Rutherford Guys, MD;  Location: [facility];  Service: Ophthalmology;  Laterality: Right;  . CATARACT EXTRACTION W/PHACO Left 02/11/2015   Procedure: CATARACT EXTRACTION PHACO AND INTRAOCULAR LENS PLACEMENT (IOC);  Surgeon: Rutherford Guys, MD;  Location: [facility];  Service: Ophthalmology;  Laterality: Left;  CDE: 7.38  . COLONOSCOPY N/A 09/26/2012   Procedure: COLONOSCOPY;  Surgeon: Jamesetta So, MD;  Location: [facility];  Service: Gastroenterology;  Laterality: N/A;  . CORONARY ARTERY BYPASS GRAFT N/A 07/15/2017   Procedure: CORONARY ARTERY BYPASS GRAFTING (CABG) X 3 USING LEFT  INTERNAL MAMMARY ARTERY AND RIGHT SAPHENOUS VEIN- ENDOSCOPICALLY HARVESTED;  Surgeon: Melrose Nakayama, MD;  Location: [facility];  Service: Open Heart Surgery;  Laterality: N/A;  . LEFT HEART CATH AND CORONARY ANGIOGRAPHY N/A 07/13/2017   Procedure: LEFT HEART CATH AND CORONARY ANGIOGRAPHY;  Surgeon: Martinique, Peter M, MD;  Location: [facility];  Service: Cardiovascular;  Laterality: N/A;  . TEE WITHOUT CARDIOVERSION N/A  07/15/2017   Procedure: TRANSESOPHAGEAL ECHOCARDIOGRAM (TEE);  Surgeon: Melrose Nakayama, MD;  Location: [facility];  Service: Open Heart Surgery;  Laterality: N/A;  . TUBAL LIGATION    . TYMPANOPLASTY Left    fluid from ear drum    Current Medications: Outpatient Medications Prior to Visit  Medication Sig Dispense Refill  . acetaminophen (TYLENOL) 325 MG tablet Take 2 tablets (650 mg total) by mouth every 6 (six) hours as needed for mild pain.    Marland Kitchen aspirin EC 325 MG EC tablet Take 1 tablet (325 mg total) by mouth daily. 30 tablet 0  . atorvastatin (LIPITOR) 10 MG tablet Take 1 tablet (10 mg total) by mouth daily at 6 PM. 30 tablet 1  . Cholecalciferol (VITAMIN D PO) Take 1 tablet by mouth daily.     . Cyanocobalamin (VITAMIN B-12 PO) Take 1 tablet by mouth daily.    . fish oil-omega-3 fatty acids 1000 MG capsule Take 1 g by mouth daily.    Marland Kitchen levothyroxine (SYNTHROID, LEVOTHROID) 75 MCG tablet Take 75 mcg by mouth daily.    . metoprolol tartrate (LOPRESSOR) 25 MG tablet Take 1 tablet (25 mg total) by mouth 2 (two) times daily. 60 tablet 1  . potassium chloride SA (K-DUR,KLOR-CON) 20 MEQ tablet Take 1 tablet (20 mEq total) by mouth daily. For one week then stop. 7 tablet 0  . traMADol (ULTRAM) 50 MG tablet Take 1 tablet (50 mg total) by mouth every 4 (four) hours as needed for moderate pain. 30 tablet 0  . VITAMIN E PO Take 1 tablet by mouth daily.     . furosemide (LASIX) 40 MG tablet Take 1 tablet (40 mg total) by mouth daily. For one week then stop. 7 tablet 0   No facility-administered medications prior to visit.      Allergies:   Sulfa antibiotics and Sulfasalazine   Social History   Socioeconomic History  . Marital status: Married    Spouse name: None  . Number of children: None  . Years of education: None  . Highest education level: None  Social Needs  . Financial resource strain: None  . Food insecurity - worry: None  . Food insecurity - inability: None  . Transportation  needs - medical: None  . Transportation needs - non-medical: None  Occupational History  . None  Tobacco Use  . Smoking status: Never Smoker  . Smokeless tobacco: Current User    Types: Snuff  Substance and Sexual Activity  . Alcohol use: Yes    Alcohol/week: 0.6 oz    Types: 1 Standard drinks or equivalent per week  . Drug use: No  . Sexual activity: Yes    Birth control/protection: Surgical  Other Topics Concern  . None  Social History Narrative  . None     Family History:  The patient's family history includes Breast cancer in her cousin; Cancer (age of onset: 70) in her maternal uncle; Diabetes in her sister and sister; Heart attack in her father; Heart disease (age of onset: 52) in her sister; Heart disease (age of  onset: 54) in her brother; Hypertension in her daughter and daughter.   Review of Systems:   Please see the history of present illness.     General:  No chills, fever, night sweats or weight changes.  Cardiovascular:  No dyspnea on exertion, edema, orthopnea, palpitations, paroxysmal nocturnal dyspnea. Positive for sternal chest pain.  Dermatological: No rash, lesions/masses Respiratory: No cough, dyspnea Urologic: No hematuria, dysuria Abdominal:   No nausea, vomiting, diarrhea, bright red blood per rectum, melena, or hematemesis Neurologic:  No visual changes, wkns, changes in mental status. All other systems reviewed and are otherwise negative except as noted above.   Physical Exam:    VS:  BP 110/68 (BP Location: Left Arm)   Pulse 75   Ht 5\' 2"  (1.575 m)   Wt 189 lb (85.7 kg)   SpO2 92%   BMI 34.57 kg/m    General: Well developed, well nourished [place] American female appearing in no acute distress. Head: Normocephalic, atraumatic, sclera non-icteric, no xanthomas, nares are without discharge.  Neck: No carotid bruits. JVD not elevated.  Lungs: Respirations regular and unlabored, without wheezes or rales.  Heart: Regular rate and rhythm. No S3 or  S4.  No murmur, no rubs, or gallops appreciated. Sternal incision appears well-healing with no erythema or drainage noted.  Abdomen: Soft, non-tender, non-distended with normoactive bowel sounds. No hepatomegaly. No rebound/guarding. No obvious abdominal masses. Msk:  Strength and tone appear normal for age. No joint deformities or effusions. Extremities: No clubbing or cyanosis. No edema.  Distal pedal pulses are 2+ bilaterally. Vein graft harvest site well-healing with no drainage noted.  Neuro: Alert and oriented X 3. Moves all extremities spontaneously. No focal deficits noted. Psych:  Responds to questions appropriately with a normal affect. Skin: No rashes or lesions noted  Wt Readings from Last 3 Encounters:  08/02/17 189 lb (85.7 kg)  07/20/17 202 lb (91.6 kg)  01/28/15 190 lb (86.2 kg)     Studies/Labs Reviewed:   EKG:  EKG is ordered today. The ekg ordered today demonstrates NSR, HR 66, with no acute ST or T-wave changes when compared to prior tracings.   Recent Labs: 07/13/2017: ALT 14; B Natriuretic Peptide 55.0; TSH 3.780 07/16/2017: Magnesium 2.5 07/18/2017: BUN 11; Creatinine, Ser 0.91; Hemoglobin 10.0; Platelets 135; Potassium 3.8; Sodium 135   Lipid Panel    Component Value Date/Time   CHOL 129 07/14/2017 0333   TRIG 62 07/14/2017 0333   HDL 31 (L) 07/14/2017 0333   CHOLHDL 4.2 07/14/2017 0333   VLDL 12 07/14/2017 0333   LDLCALC 86 07/14/2017 0333    Additional studies/ records that were reviewed today include:   Cardiac Catheterization: 07/13/2017  Mid LM lesion is 25% stenosed.  Dist LM to Ost LAD lesion is 80% stenosed.  Ost Cx to Prox Cx lesion is 30% stenosed.  Ost 1st Mrg lesion is 50% stenosed.  Prox LAD lesion is 90% stenosed.  The left ventricular systolic function is normal.  LV end diastolic pressure is normal.  The left ventricular ejection fraction is 55-65% by visual estimate.   1. Single vessel obstructive CAD. Patient has complex  ostial and proximal to mid LAD disease that involves the origin of the ramus intermediate and first diagonal respectively. 2. Normal LV function 3. Normal LVEDP  Plan: given complexity of lesions with ostial and bifurcation LAD disease I would recommend consideration for CABG.  Echocardiogram: 07/14/2017 Study Conclusions  - Left ventricle: The cavity size was normal. Wall thickness was  increased in a pattern of mild LVH. Systolic function was normal.   The estimated ejection fraction was in the range of 55% to 60%.   Wall motion was normal; there were no regional wall motion   abnormalities. Doppler parameters are consistent with abnormal   left ventricular relaxation (grade 1 diastolic dysfunction). - Mitral valve: Valve area by pressure half-time: 1.26 cm^2.  Impressions:  - Normal LV systolic function; mild LVH; mild diastolic   dysfunction.  Assessment:    1. Coronary artery disease involving native coronary artery of native heart without angina pectoris   2. Hx of CABG   3. Essential hypertension   4. Hyperlipidemia LDL goal <70   5. Medication management   6. Tobacco use      Plan:   In order of problems listed above:  1. CAD - recently evaluated for exertional chest discomfort with her cardiac catheterization showing single vessel obstructive CAD with 80% distal left main, 90% proximal LAD, 50% ostial first marginal, and 30% ostial circumflex stenosis. Due to the complexity of her lesions and bifurcation LAD disease, CT Surgery consult for consideration of CABG was recommended. She underwent CABG x3 with LIMA-LAD, SVG-D1, and SVG-RI.   - she denies any recent exertional chest pain or dyspnea on exertion. Sternal pain continues to improve and her incisions are well-healing on examination today.  - will continue on ASA 325 mg daily, Atorvastatin 10 mg daily, and Lopressor 25mg  BID.   2. HTN - BP is well-controlled at 110/68 during today's visit. - continue  Lopressor 25mg  BID.   3. HLD - recent FLP showed total cholesterol of 129, HDL 31, and LDL 86. Started on Atorvastatin 10mg  daily at that time. Will need repeat FLP and LFT's in 6-8 weeks. If not at goal, would further titrate statin dosing.   4. Tobacco Use - she has quit using smokeless tobacco products since her recent surgery.  - congratulated on this with continued cessation advised.    Medication Adjustments/Labs and Tests Ordered: Current medicines are reviewed at length with the patient today.  Concerns regarding medicines are outlined above.  Medication changes, Labs and Tests ordered today are listed in the Patient Instructions below. Patient Instructions  Medication Instructions:  Your physician recommends that you continue on your current medications as directed. Please refer to the Current Medication list given to you today.   Labwork: Your physician recommends that you return for lab work Fasting in 3 months just before your next visit.   Testing/Procedures: NONE   Follow-Up: Your physician recommends that you schedule a follow-up appointment in: 3 Months   Any Other Special Instructions Will Be Listed Below (If Applicable).  If you need a refill on your cardiac medications before your next appointment, please call your pharmacy.  Thank you for choosing [facility]!    Signed, Erma Heritage, PA-C  08/02/2017 7:06 PM    [place] [address] [place], [place] [address] Phone: [phone]

## 2017-08-02 ENCOUNTER — Ambulatory Visit: Payer: Medicare HMO | Admitting: Student

## 2017-08-02 ENCOUNTER — Encounter: Payer: Self-pay | Admitting: Student

## 2017-08-02 VITALS — BP 110/68 | HR 75 | Ht 62.0 in | Wt 189.0 lb

## 2017-08-02 DIAGNOSIS — E785 Hyperlipidemia, unspecified: Secondary | ICD-10-CM | POA: Diagnosis not present

## 2017-08-02 DIAGNOSIS — Z79899 Other long term (current) drug therapy: Secondary | ICD-10-CM

## 2017-08-02 DIAGNOSIS — I10 Essential (primary) hypertension: Secondary | ICD-10-CM

## 2017-08-02 DIAGNOSIS — I2510 Atherosclerotic heart disease of native coronary artery without angina pectoris: Secondary | ICD-10-CM | POA: Diagnosis not present

## 2017-08-02 DIAGNOSIS — Z951 Presence of aortocoronary bypass graft: Secondary | ICD-10-CM

## 2017-08-02 DIAGNOSIS — Z720 Tobacco use: Secondary | ICD-10-CM | POA: Diagnosis not present

## 2017-08-02 NOTE — Patient Instructions (Signed)
Medication Instructions:  Your physician recommends that you continue on your current medications as directed. Please refer to the Current Medication list given to you today.   Labwork: Your physician recommends that you return for lab work Fasting in 3 months just before your next visit.    Testing/Procedures: NONE   Follow-Up: Your physician recommends that you schedule a follow-up appointment in: 3 Months    Any Other Special Instructions Will Be Listed Below (If Applicable).     If you need a refill on your cardiac medications before your next appointment, please call your pharmacy.  Thank you for choosing [facility]!

## 2017-08-03 DIAGNOSIS — E039 Hypothyroidism, unspecified: Secondary | ICD-10-CM | POA: Diagnosis not present

## 2017-08-03 DIAGNOSIS — R739 Hyperglycemia, unspecified: Secondary | ICD-10-CM | POA: Diagnosis not present

## 2017-08-03 DIAGNOSIS — H8109 Meniere's disease, unspecified ear: Secondary | ICD-10-CM | POA: Diagnosis not present

## 2017-08-03 DIAGNOSIS — M1990 Unspecified osteoarthritis, unspecified site: Secondary | ICD-10-CM | POA: Diagnosis not present

## 2017-08-03 DIAGNOSIS — I10 Essential (primary) hypertension: Secondary | ICD-10-CM | POA: Diagnosis not present

## 2017-08-03 DIAGNOSIS — Z48812 Encounter for surgical aftercare following surgery on the circulatory system: Secondary | ICD-10-CM | POA: Diagnosis not present

## 2017-08-03 DIAGNOSIS — F17290 Nicotine dependence, other tobacco product, uncomplicated: Secondary | ICD-10-CM | POA: Diagnosis not present

## 2017-08-03 DIAGNOSIS — I25110 Atherosclerotic heart disease of native coronary artery with unstable angina pectoris: Secondary | ICD-10-CM | POA: Diagnosis not present

## 2017-08-03 DIAGNOSIS — E669 Obesity, unspecified: Secondary | ICD-10-CM | POA: Diagnosis not present

## 2017-08-04 DIAGNOSIS — H8109 Meniere's disease, unspecified ear: Secondary | ICD-10-CM | POA: Diagnosis not present

## 2017-08-04 DIAGNOSIS — F17290 Nicotine dependence, other tobacco product, uncomplicated: Secondary | ICD-10-CM | POA: Diagnosis not present

## 2017-08-04 DIAGNOSIS — Z48812 Encounter for surgical aftercare following surgery on the circulatory system: Secondary | ICD-10-CM | POA: Diagnosis not present

## 2017-08-04 DIAGNOSIS — I10 Essential (primary) hypertension: Secondary | ICD-10-CM | POA: Diagnosis not present

## 2017-08-04 DIAGNOSIS — R739 Hyperglycemia, unspecified: Secondary | ICD-10-CM | POA: Diagnosis not present

## 2017-08-04 DIAGNOSIS — I25110 Atherosclerotic heart disease of native coronary artery with unstable angina pectoris: Secondary | ICD-10-CM | POA: Diagnosis not present

## 2017-08-04 DIAGNOSIS — M1990 Unspecified osteoarthritis, unspecified site: Secondary | ICD-10-CM | POA: Diagnosis not present

## 2017-08-04 DIAGNOSIS — E669 Obesity, unspecified: Secondary | ICD-10-CM | POA: Diagnosis not present

## 2017-08-04 DIAGNOSIS — E039 Hypothyroidism, unspecified: Secondary | ICD-10-CM | POA: Diagnosis not present

## 2017-08-05 DIAGNOSIS — E039 Hypothyroidism, unspecified: Secondary | ICD-10-CM | POA: Diagnosis not present

## 2017-08-05 DIAGNOSIS — Z48812 Encounter for surgical aftercare following surgery on the circulatory system: Secondary | ICD-10-CM | POA: Diagnosis not present

## 2017-08-05 DIAGNOSIS — I25110 Atherosclerotic heart disease of native coronary artery with unstable angina pectoris: Secondary | ICD-10-CM | POA: Diagnosis not present

## 2017-08-05 DIAGNOSIS — M1990 Unspecified osteoarthritis, unspecified site: Secondary | ICD-10-CM | POA: Diagnosis not present

## 2017-08-05 DIAGNOSIS — I10 Essential (primary) hypertension: Secondary | ICD-10-CM | POA: Diagnosis not present

## 2017-08-05 DIAGNOSIS — R739 Hyperglycemia, unspecified: Secondary | ICD-10-CM | POA: Diagnosis not present

## 2017-08-05 DIAGNOSIS — F17290 Nicotine dependence, other tobacco product, uncomplicated: Secondary | ICD-10-CM | POA: Diagnosis not present

## 2017-08-05 DIAGNOSIS — E669 Obesity, unspecified: Secondary | ICD-10-CM | POA: Diagnosis not present

## 2017-08-05 DIAGNOSIS — H8109 Meniere's disease, unspecified ear: Secondary | ICD-10-CM | POA: Diagnosis not present

## 2017-08-08 DIAGNOSIS — F17290 Nicotine dependence, other tobacco product, uncomplicated: Secondary | ICD-10-CM | POA: Diagnosis not present

## 2017-08-08 DIAGNOSIS — E039 Hypothyroidism, unspecified: Secondary | ICD-10-CM | POA: Diagnosis not present

## 2017-08-08 DIAGNOSIS — Z48812 Encounter for surgical aftercare following surgery on the circulatory system: Secondary | ICD-10-CM | POA: Diagnosis not present

## 2017-08-08 DIAGNOSIS — I25110 Atherosclerotic heart disease of native coronary artery with unstable angina pectoris: Secondary | ICD-10-CM | POA: Diagnosis not present

## 2017-08-08 DIAGNOSIS — H8109 Meniere's disease, unspecified ear: Secondary | ICD-10-CM | POA: Diagnosis not present

## 2017-08-08 DIAGNOSIS — M1990 Unspecified osteoarthritis, unspecified site: Secondary | ICD-10-CM | POA: Diagnosis not present

## 2017-08-08 DIAGNOSIS — E669 Obesity, unspecified: Secondary | ICD-10-CM | POA: Diagnosis not present

## 2017-08-08 DIAGNOSIS — R739 Hyperglycemia, unspecified: Secondary | ICD-10-CM | POA: Diagnosis not present

## 2017-08-08 DIAGNOSIS — I10 Essential (primary) hypertension: Secondary | ICD-10-CM | POA: Diagnosis not present

## 2017-08-10 DIAGNOSIS — Z48812 Encounter for surgical aftercare following surgery on the circulatory system: Secondary | ICD-10-CM | POA: Diagnosis not present

## 2017-08-10 DIAGNOSIS — R739 Hyperglycemia, unspecified: Secondary | ICD-10-CM | POA: Diagnosis not present

## 2017-08-10 DIAGNOSIS — I10 Essential (primary) hypertension: Secondary | ICD-10-CM | POA: Diagnosis not present

## 2017-08-10 DIAGNOSIS — E039 Hypothyroidism, unspecified: Secondary | ICD-10-CM | POA: Diagnosis not present

## 2017-08-10 DIAGNOSIS — F17290 Nicotine dependence, other tobacco product, uncomplicated: Secondary | ICD-10-CM | POA: Diagnosis not present

## 2017-08-10 DIAGNOSIS — H8109 Meniere's disease, unspecified ear: Secondary | ICD-10-CM | POA: Diagnosis not present

## 2017-08-10 DIAGNOSIS — M1990 Unspecified osteoarthritis, unspecified site: Secondary | ICD-10-CM | POA: Diagnosis not present

## 2017-08-10 DIAGNOSIS — E669 Obesity, unspecified: Secondary | ICD-10-CM | POA: Diagnosis not present

## 2017-08-10 DIAGNOSIS — I25110 Atherosclerotic heart disease of native coronary artery with unstable angina pectoris: Secondary | ICD-10-CM | POA: Diagnosis not present

## 2017-08-11 DIAGNOSIS — E669 Obesity, unspecified: Secondary | ICD-10-CM | POA: Diagnosis not present

## 2017-08-11 DIAGNOSIS — M1990 Unspecified osteoarthritis, unspecified site: Secondary | ICD-10-CM | POA: Diagnosis not present

## 2017-08-11 DIAGNOSIS — F17290 Nicotine dependence, other tobacco product, uncomplicated: Secondary | ICD-10-CM | POA: Diagnosis not present

## 2017-08-11 DIAGNOSIS — E039 Hypothyroidism, unspecified: Secondary | ICD-10-CM | POA: Diagnosis not present

## 2017-08-11 DIAGNOSIS — Z48812 Encounter for surgical aftercare following surgery on the circulatory system: Secondary | ICD-10-CM | POA: Diagnosis not present

## 2017-08-11 DIAGNOSIS — R739 Hyperglycemia, unspecified: Secondary | ICD-10-CM | POA: Diagnosis not present

## 2017-08-11 DIAGNOSIS — I10 Essential (primary) hypertension: Secondary | ICD-10-CM | POA: Diagnosis not present

## 2017-08-11 DIAGNOSIS — I25110 Atherosclerotic heart disease of native coronary artery with unstable angina pectoris: Secondary | ICD-10-CM | POA: Diagnosis not present

## 2017-08-11 DIAGNOSIS — H8109 Meniere's disease, unspecified ear: Secondary | ICD-10-CM | POA: Diagnosis not present

## 2017-08-16 ENCOUNTER — Ambulatory Visit: Payer: Medicare HMO | Admitting: Thoracic Surgery (Cardiothoracic Vascular Surgery)

## 2017-08-22 ENCOUNTER — Other Ambulatory Visit: Payer: Self-pay | Admitting: Thoracic Surgery (Cardiothoracic Vascular Surgery)

## 2017-08-22 DIAGNOSIS — Z951 Presence of aortocoronary bypass graft: Secondary | ICD-10-CM

## 2017-08-23 ENCOUNTER — Ambulatory Visit (INDEPENDENT_AMBULATORY_CARE_PROVIDER_SITE_OTHER): Payer: Self-pay | Admitting: Thoracic Surgery (Cardiothoracic Vascular Surgery)

## 2017-08-23 ENCOUNTER — Ambulatory Visit
Admission: RE | Admit: 2017-08-23 | Discharge: 2017-08-23 | Disposition: A | Discharge status: Home / Self Care | Payer: Medicare HMO | Source: Ambulatory Visit | Attending: Thoracic Surgery (Cardiothoracic Vascular Surgery) | Admitting: Thoracic Surgery (Cardiothoracic Vascular Surgery)

## 2017-08-23 ENCOUNTER — Encounter: Payer: Self-pay | Admitting: Thoracic Surgery (Cardiothoracic Vascular Surgery)

## 2017-08-23 VITALS — BP 145/76 | HR 60 | Resp 20 | Ht 63.0 in | Wt 191.0 lb

## 2017-08-23 DIAGNOSIS — R0989 Other specified symptoms and signs involving the circulatory and respiratory systems: Secondary | ICD-10-CM | POA: Diagnosis not present

## 2017-08-23 DIAGNOSIS — Z951 Presence of aortocoronary bypass graft: Secondary | ICD-10-CM

## 2017-08-23 MED ORDER — FUROSEMIDE 20 MG PO TABS
20.0000 mg | ORAL_TABLET | Freq: Every day | ORAL | 0 refills | Status: DC
Start: 2017-08-23 — End: 2017-09-06

## 2017-08-23 MED ORDER — METOPROLOL TARTRATE 25 MG PO TABS: 25.0000 mg | ORAL_TABLET | Freq: Two times a day (BID) | ORAL | 1 refills | Status: DC

## 2017-08-23 MED ORDER — TRAMADOL HCL 50 MG PO TABS: 50.0000 mg | ORAL_TABLET | Freq: Two times a day (BID) | ORAL | 0 refills | Status: DC | PRN

## 2017-08-23 MED ORDER — CEPHALEXIN 500 MG PO CAPS: 500.0000 mg | ORAL_CAPSULE | Freq: Three times a day (TID) | ORAL | 0 refills | Status: DC

## 2017-08-23 NOTE — Progress Notes (Signed)
[address]       [place] [address]             [phone]       HPI: Mrs. Bibby returns for follow-up after having coronary bypass grafting.  Mrs. Weimann is a 72 year old woman with history of hypertension, tobacco abuse, obesity, arthritis, hypothyroidism, and vertigo.  She presented with a 3-week history of substernal chest pain.  She ruled in for non-ST elevation MI with a troponin of 0.07.  At catheterization she was found to have severe single-vessel coronary disease not amenable to percutaneous intervention.  She underwent coronary bypass grafting x3 on 07/15/2017.  He did not have any significant postoperative complications and went home on day 5.  Today she complains of incisional pain in her chest, swelling in her legs, and redness and swelling in her leg incision.  She is primarily taking Tylenol but will occasionally use tramadol for pain.  She is not using the tramadol every day.  She has not had any anginal type discomfort.  Past Medical History:  Diagnosis Date  . Arthritis    "hands sometimes" (07/13/2017)  . Coronary artery disease    a. s/p CABG x3 in 06/2017 with LIMA-LAD, SVG-D1, and SVG-RI.    Marland Kitchen Dips tobacco   . Essential hypertension   . Hypothyroid   . Meniere disease   . Obesity   . Osteopenia 01/2012   T score -1.3 FRAX 7.9%/0.6%     Current Outpatient Medications  Medication Sig Dispense Refill  . acetaminophen (TYLENOL) 325 MG tablet Take 2 tablets (650 mg total) by mouth every 6 (six) hours as needed for mild pain.    Marland Kitchen aspirin EC 325 MG EC tablet Take 1 tablet (325 mg total) by mouth daily. 30 tablet 0  . atorvastatin (LIPITOR) 10 MG tablet Take 1 tablet (10 mg total) by mouth daily at 6 PM. 30 tablet 1  . Cholecalciferol (VITAMIN D PO) Take 1 tablet by mouth daily.     . Cyanocobalamin (VITAMIN B-12 PO) Take 1 tablet by mouth daily.    . fish oil-omega-3 fatty acids 1000 MG capsule Take 1 g by mouth daily.    Marland Kitchen  levothyroxine (SYNTHROID, LEVOTHROID) 75 MCG tablet Take 75 mcg by mouth daily.    . metoprolol tartrate (LOPRESSOR) 25 MG tablet Take 1 tablet (25 mg total) by mouth 2 (two) times daily. 60 tablet 1  . potassium chloride SA (K-DUR,KLOR-CON) 20 MEQ tablet Take 1 tablet (20 mEq total) by mouth daily. For one week then stop. 7 tablet 0  . traMADol (ULTRAM) 50 MG tablet Take 1 tablet (50 mg total) by mouth 2 (two) times daily as needed for moderate pain. 25 tablet 0  . VITAMIN E PO Take 1 tablet by mouth daily.     . cephALEXin (KEFLEX) 500 MG capsule Take 1 capsule (500 mg total) by mouth 3 (three) times daily. 21 capsule 0  . furosemide (LASIX) 20 MG tablet Take 1 tablet (20 mg total) by mouth daily. 30 tablet 0   No current facility-administered medications for this visit.     Physical Exam BP (!) 145/76   Pulse 60   Resp 20   Ht 5\' 3"  (1.6 m)   Wt 191 lb (86.6 kg)   SpO2 96% Comment: RA  BMI 33.54 kg/m  72 year old woman in no acute distress Alert and oriented x3 with no focal deficits Sternal incision clean dry and intact sternum  stable very tender to palpation around the sternal incision Cardiac regular rate and rhythm normal S1 and S2 Lungs diminished at both bases Leg incision with induration and mild erythema 1+ peripheral edema bilaterally  Diagnostic Tests: CHEST - 2 VIEW  COMPARISON:  Chest radiograph July 19, 2017  FINDINGS: Cardiac silhouette is similarly enlarged. Status post median sternotomy for CABG. Calcified aortic knob. Similar pulmonary vascular congestion without pleural effusion or focal consolidation. Multifocal bandlike densities. Improved aeration RIGHT lung base with minimal pleural thickening. No pneumothorax. Soft tissue planes and included osseous structures are nonacute; LEFT acromioclavicular undersurface spurring associated with impingement.  IMPRESSION: Stable cardiomegaly and similar vascular congestion.  Multifocal atelectasis/scarring with improved aeration lung bases.   Electronically Signed   By: Elon Alas M.D.   On: 08/23/2017 16:27  Impression: 72 year old woman with multiple cardiac risk factors who is about 5 weeks out from coronary bypass grafting x3.  Overall she is doing well.  There are some issues.  She does have cutaneous hypersensitivy pain around her sternal incision.  This should improve with time.  The sternum is stable.  Leg wound cellulitis-going to give her a prescription for Keflex 500 mg p.o. 3 times daily for a week.  She was instructed to take all the pills even if the redness goes away.  Peripheral edema-we will start her on Lasix 20 mg daily.  I cautioned her to limit her salt intake.  Should not lift anything over 10 pounds for another 2 weeks.  She should not try to drive until her pain improves.  Plan: Keflex 500 mg 3 times daily Lasix 20 mg daily Return in 2 weeks  Melrose Nakayama, MD [facility] [phone]

## 2017-08-24 DIAGNOSIS — I25110 Atherosclerotic heart disease of native coronary artery with unstable angina pectoris: Secondary | ICD-10-CM | POA: Diagnosis not present

## 2017-08-24 DIAGNOSIS — I10 Essential (primary) hypertension: Secondary | ICD-10-CM | POA: Diagnosis not present

## 2017-08-24 DIAGNOSIS — F17290 Nicotine dependence, other tobacco product, uncomplicated: Secondary | ICD-10-CM | POA: Diagnosis not present

## 2017-08-24 DIAGNOSIS — E039 Hypothyroidism, unspecified: Secondary | ICD-10-CM | POA: Diagnosis not present

## 2017-08-24 DIAGNOSIS — R739 Hyperglycemia, unspecified: Secondary | ICD-10-CM | POA: Diagnosis not present

## 2017-08-24 DIAGNOSIS — Z48812 Encounter for surgical aftercare following surgery on the circulatory system: Secondary | ICD-10-CM | POA: Diagnosis not present

## 2017-08-24 DIAGNOSIS — M1990 Unspecified osteoarthritis, unspecified site: Secondary | ICD-10-CM | POA: Diagnosis not present

## 2017-08-24 DIAGNOSIS — H8109 Meniere's disease, unspecified ear: Secondary | ICD-10-CM | POA: Diagnosis not present

## 2017-08-24 DIAGNOSIS — E669 Obesity, unspecified: Secondary | ICD-10-CM | POA: Diagnosis not present

## 2017-09-05 ENCOUNTER — Encounter (HOSPITAL_COMMUNITY): Payer: Self-pay

## 2017-09-05 ENCOUNTER — Encounter (HOSPITAL_COMMUNITY)
Admission: RE | Admit: 2017-09-05 | Discharge: 2017-09-05 | Disposition: A | Discharge status: Home / Self Care | Payer: Medicare HMO | Source: Ambulatory Visit | Attending: Cardiology | Admitting: Cardiology

## 2017-09-05 VITALS — BP 110/70 | HR 73 | Ht 62.0 in | Wt 192.2 lb

## 2017-09-05 DIAGNOSIS — Z951 Presence of aortocoronary bypass graft: Secondary | ICD-10-CM | POA: Diagnosis not present

## 2017-09-05 NOTE — Progress Notes (Signed)
Daily Session Note  Patient Details  Name: CANNIE MUCKLE MRN: [id number] Date of Birth: 05-17-1946 Referring Provider:     CARDIAC REHAB PHASE II ORIENTATION from 09/05/2017 in [facility]  Referring Provider  Dr. Harl Bowie      Encounter Date: 09/05/2017  Check In: Session Check In - 09/05/17 0800      Check-In   Location  [facility]    Staff Present  Russella Dar, MS, EP, [facility], Exercise Physiologist;Gregory Luther Parody, BS, EP, Exercise Physiologist;Debra Wynetta Emery, RN, BSN    Supervising physician immediately available to respond to emergencies  See telemetry face sheet for immediately available MD    Medication changes reported      No    Fall or balance concerns reported     No    Tobacco Cessation  -- quit dipping snuff 06/2017    Warm-up and Cool-down  Performed as group-led instruction    Resistance Training Performed  Yes    VAD Patient?  No      Pain Assessment   Currently in Pain?  No/denies    Pain Score  0-No pain    Multiple Pain Sites  No       Capillary Blood Glucose: No results found for this or any previous visit (from the past 24 hour(s)).    Social History   Tobacco Use  Smoking Status Never Smoker  Smokeless Tobacco Current User  . Types: Snuff    Goals Met:  Independence with exercise equipment Exercise tolerated well No report of cardiac concerns or symptoms Strength training completed today  Goals Unmet:  Not Applicable  Comments: Check out 10:30   Dr. Kate Sable is Medical Director for [facility] and Pulmonary Rehab.

## 2017-09-05 NOTE — Progress Notes (Signed)
Cardiac Individual Treatment Plan  Patient Details  Name: Gloria Mccoy MRN: [id number] Date of Birth: Jun 18, 1946 Referring Provider:     CARDIAC REHAB PHASE II ORIENTATION from 09/05/2017 in [facility]  Referring Provider  Dr. Harl Bowie      Initial Encounter Date:    CARDIAC REHAB PHASE II ORIENTATION from 09/05/2017 in [facility]  Date  09/05/17  Referring Provider  Dr. Harl Bowie      Visit Diagnosis: S/P CABG x 3  Patient's Home Medications on Admission:  Current Outpatient Medications:  .  acetaminophen (TYLENOL) 325 MG tablet, Take 2 tablets (650 mg total) by mouth every 6 (six) hours as needed for mild pain., Disp: , Rfl:  .  aspirin EC 325 MG EC tablet, Take 1 tablet (325 mg total) by mouth daily., Disp: 30 tablet, Rfl: 0 .  atorvastatin (LIPITOR) 10 MG tablet, Take 1 tablet (10 mg total) by mouth daily at 6 PM., Disp: 30 tablet, Rfl: 1 .  cephALEXin (KEFLEX) 500 MG capsule, Take 1 capsule (500 mg total) by mouth 3 (three) times daily. (Patient not taking: Reported on 09/05/2017), Disp: 21 capsule, Rfl: 0 .  Cholecalciferol (VITAMIN D PO), Take 1 tablet by mouth daily. , Disp: , Rfl:  .  Cyanocobalamin (VITAMIN B-12 PO), Take 1 tablet by mouth daily., Disp: , Rfl:  .  fish oil-omega-3 fatty acids 1000 MG capsule, Take 1 g by mouth daily., Disp: , Rfl:  .  furosemide (LASIX) 20 MG tablet, Take 1 tablet (20 mg total) by mouth daily., Disp: 30 tablet, Rfl: 0 .  levothyroxine (SYNTHROID, LEVOTHROID) 75 MCG tablet, Take 75 mcg by mouth daily., Disp: , Rfl:  .  metoprolol tartrate (LOPRESSOR) 25 MG tablet, Take 1 tablet (25 mg total) by mouth 2 (two) times daily., Disp: 60 tablet, Rfl: 1 .  potassium chloride SA (K-DUR,KLOR-CON) 20 MEQ tablet, Take 1 tablet (20 mEq total) by mouth daily. For one week then stop. (Patient not taking: Reported on 09/05/2017), Disp: 7 tablet, Rfl: 0 .  traMADol (ULTRAM) 50 MG tablet, Take 1 tablet (50 mg total) by  mouth 2 (two) times daily as needed for moderate pain. (Patient not taking: Reported on 09/05/2017), Disp: 25 tablet, Rfl: 0 .  VITAMIN E PO, Take 1 tablet by mouth daily. , Disp: , Rfl:   Past Medical History: Past Medical History:  Diagnosis Date  . Arthritis    "hands sometimes" (07/13/2017)  . Coronary artery disease    a. s/p CABG x3 in 06/2017 with LIMA-LAD, SVG-D1, and SVG-RI.    Marland Kitchen Dips tobacco   . Essential hypertension   . Hypothyroid   . Meniere disease   . Obesity   . Osteopenia 01/2012   T score -1.3 FRAX 7.9%/0.6%    Tobacco Use: Social History   Tobacco Use  Smoking Status Never Smoker  Smokeless Tobacco Current User  . Types: Snuff    Labs: Recent Review Flowsheet Data    Labs for ITP Cardiac and Pulmonary Rehab Latest Ref Rng & Units 07/15/2017 07/16/2017 07/16/2017 07/16/2017 07/17/2017   Cholestrol 0 - 200 mg/dL - - - - -   LDLCALC 0 - 99 mg/dL - - - - -   HDL >40 mg/dL - - - - -   Trlycerides <150 mg/dL - - - - -   Hemoglobin A1c 4.8 - 5.6 % - - - - -   PHART 7.350 - 7.450 7.292(L) 7.325(L) 7.313(L) - -  PCO2ART 32.0 - 48.0 mmHg 44.4 43.2 46.5 - -   HCO3 20.0 - 28.0 mmol/L 21.1 22.2 23.3 - -   TCO2 22 - 32 mmol/L 22 23 25 25 28    ACIDBASEDEF 0.0 - 2.0 mmol/L 5.0(H) 3.0(H) 3.0(H) - -   O2SAT % 96.0 97.0 99.0 - -      Capillary Blood Glucose: Lab Results  Component Value Date   GLUCAP 124 (H) 07/17/2017   GLUCAP 135 (H) 07/17/2017   GLUCAP 144 (H) 07/17/2017   GLUCAP 120 (H) 07/17/2017   GLUCAP 99 07/17/2017     Exercise Target Goals: Date: 09/05/17  Exercise Program Goal: Individual exercise prescription set using results from initial 6 min walk test and THRR while considering  patient's activity barriers and safety.   Exercise Prescription Goal: Starting with aerobic activity 30 plus minutes a day, 3 days per week for initial exercise prescription. Provide home exercise prescription and guidelines that participant acknowledges understanding  prior to discharge.  Activity Barriers & Risk Stratification: Activity Barriers & Cardiac Risk Stratification - 09/05/17 1018      Activity Barriers & Cardiac Risk Stratification   Activity Barriers  Deconditioning    Cardiac Risk Stratification  High       6 Minute Walk: 6 Minute Walk    Row Name 09/05/17 1018         6 Minute Walk   Phase  Initial     Distance  1050 feet     Distance % Change  0 %     Distance Feet Change  0 ft     Walk Time  6 minutes     # of Rest Breaks  0     MPH  1.98     METS  2.51     RPE  9     Perceived Dyspnea   9     VO2 Peak  7.06     Symptoms  No     Resting HR  73 bpm     Resting BP  110/70     Resting Oxygen Saturation   94 %     Exercise Oxygen Saturation  during 6 min walk  88 %     Max Ex. HR  107 bpm     Max Ex. BP  140/72     2 Minute Post BP  116/72        Oxygen Initial Assessment:   Oxygen Re-Evaluation:   Oxygen Discharge (Final Oxygen Re-Evaluation):   Initial Exercise Prescription: Initial Exercise Prescription - 09/05/17 1000      Date of Initial Exercise RX and Referring Provider   Date  09/05/17    Referring Provider  Dr. Harl Bowie      Treadmill   MPH  1.3    Grade  0    Minutes  15    METs  1.9      NuStep   Level  2    SPM  55    Minutes  20    METs  1.8      Prescription Details   Frequency (times per week)  3    Duration  Progress to 30 minutes of continuous aerobic without signs/symptoms of physical distress      Intensity   THRR 40-80% of Max Heartrate  [id number]    Ratings of Perceived Exertion  11-13    Perceived Dyspnea  0-4      Progression   Progression  Continue progressive overload  as per policy without signs/symptoms or physical distress.      Resistance Training   Training Prescription  Yes    Weight  1    Reps  10-15       Perform Capillary Blood Glucose checks as needed.  Exercise Prescription Changes:   Exercise Comments:   Exercise Goals and  Review:  Exercise Goals    Row Name 09/05/17 1019             Exercise Goals   Increase Physical Activity  Yes       Intervention  Provide advice, education, support and counseling about physical activity/exercise needs.;Develop an individualized exercise prescription for aerobic and resistive training based on initial evaluation findings, risk stratification, comorbidities and participant's personal goals.       Expected Outcomes  Short Term: Attend rehab on a regular basis to increase amount of physical activity.       Increase Strength and Stamina  Yes       Intervention  Develop an individualized exercise prescription for aerobic and resistive training based on initial evaluation findings, risk stratification, comorbidities and participant's personal goals.;Provide advice, education, support and counseling about physical activity/exercise needs.       Expected Outcomes  Short Term: Increase workloads from initial exercise prescription for resistance, speed, and METs.       Able to understand and use rate of perceived exertion (RPE) scale  Yes       Intervention  Provide education and explanation on how to use RPE scale       Expected Outcomes  Short Term: Able to use RPE daily in rehab to express subjective intensity level;Long Term:  Able to use RPE to guide intensity level when exercising independently       Able to understand and use Dyspnea scale  Yes       Intervention  Provide education and explanation on how to use Dyspnea scale       Expected Outcomes  Short Term: Able to use Dyspnea scale daily in rehab to express subjective sense of shortness of breath during exertion;Long Term: Able to use Dyspnea scale to guide intensity level when exercising independently       Knowledge and understanding of Target Heart Rate Range (THRR)  Yes       Intervention  Provide education and explanation of THRR including how the numbers were predicted and where they are located for reference        Expected Outcomes  Short Term: Able to state/look up THRR;Long Term: Able to use THRR to govern intensity when exercising independently;Short Term: Able to use daily as guideline for intensity in rehab       Able to check pulse independently  Yes       Intervention  Provide education and demonstration on how to check pulse in carotid and radial arteries.;Review the importance of being able to check your own pulse for safety during independent exercise       Expected Outcomes  Short Term: Able to explain why pulse checking is important during independent exercise;Long Term: Able to check pulse independently and accurately       Understanding of Exercise Prescription  Yes       Intervention  Provide education, explanation, and written materials on patient's individual exercise prescription       Expected Outcomes  Short Term: Able to explain program exercise prescription;Long Term: Able to explain home exercise prescription to exercise independently  Exercise Goals Re-Evaluation :    Discharge Exercise Prescription (Final Exercise Prescription Changes):   Nutrition:  Target Goals: Understanding of nutrition guidelines, daily intake of sodium 1500mg , cholesterol 200mg , calories 30% from fat and 7% or less from saturated fats, daily to have 5 or more servings of fruits and vegetables.  Biometrics: Pre Biometrics - 09/05/17 1019      Pre Biometrics   Height  5\' 2"  (1.575 m)    Weight  192 lb 3.2 oz (87.2 kg)    Waist Circumference  40 inches    Hip Circumference  41 inches    Waist to Hip Ratio  0.98 %    BMI (Calculated)  35.14    Triceps Skinfold  20 mm    % Body Fat  43.6 %    Grip Strength  49.4 kg    Flexibility  11.33 in    Single Leg Stand  3 seconds        Nutrition Therapy Plan and Nutrition Goals: Nutrition Therapy & Goals - 09/05/17 1116      Personal Nutrition Goals   Personal Goal #2  Patient is completley changed her diet. Eating heart healthy     Additional Goals?  No       Nutrition Assessments: Nutrition Assessments - 09/05/17 1116      MEDFICTS Scores   Pre Score  9       Nutrition Goals Re-Evaluation:   Nutrition Goals Discharge (Final Nutrition Goals Re-Evaluation):   Psychosocial: Target Goals: Acknowledge presence or absence of significant depression and/or stress, maximize coping skills, provide positive support system. Participant is able to verbalize types and ability to use techniques and skills needed for reducing stress and depression.  Initial Review & Psychosocial Screening: Initial Psych Review & Screening - 09/05/17 1120      Initial Review   Current issues with  Current Sleep Concerns      Family Dynamics   Good Support System?  Yes      Barriers   Psychosocial barriers to participate in program  Psychosocial barriers identified (see note)      Screening Interventions   Interventions  Encouraged to exercise       Quality of Life Scores: Quality of Life - 09/05/17 1021      Quality of Life Scores   Health/Function Pre  19.97 %    Socioeconomic Pre  17.5 %    Psych/Spiritual Pre  20 %    Family Pre  17.8 %    GLOBAL Pre  19.15 %      Scores of 19 and below usually indicate a poorer quality of life in these areas.  A difference of  2-3 points is a clinically meaningful difference.  A difference of 2-3 points in the total score of the Quality of Life Index has been associated with significant improvement in overall quality of life, self-image, physical symptoms, and general health in studies assessing change in quality of life.  PHQ-9: Recent Review Flowsheet Data    Depression screen [facility] 2/9 09/05/2017   Decreased Interest 0   Down, Depressed, Hopeless 0   PHQ - 2 Score 0   Altered sleeping 2   Tired, decreased energy 2   Change in appetite 0   Feeling bad or failure about yourself  0   Trouble concentrating 0   Moving slowly or fidgety/restless 0   Suicidal thoughts 0   PHQ-9  Score 4   Difficult doing work/chores Not difficult at  all     Interpretation of Total Score  Total Score Depression Severity:  1-4 = Minimal depression, 5-9 = Mild depression, 10-14 = Moderate depression, 15-19 = Moderately severe depression, 20-27 = Severe depression   Psychosocial Evaluation and Intervention: Psychosocial Evaluation - 09/05/17 1121      Psychosocial Evaluation & Interventions   Interventions  Encouraged to exercise with the program and follow exercise prescription    Continue Psychosocial Services   Follow up required by staff       Psychosocial Re-Evaluation:   Psychosocial Discharge (Final Psychosocial Re-Evaluation):   Vocational Rehabilitation: Provide vocational rehab assistance to qualifying candidates.   Vocational Rehab Evaluation & Intervention: Vocational Rehab - 09/05/17 1113      Initial Vocational Rehab Evaluation & Intervention   Assessment shows need for Vocational Rehabilitation  No       Education: Education Goals: Education classes will be provided on a weekly basis, covering required topics. Participant will state understanding/return demonstration of topics presented.  Learning Barriers/Preferences: Learning Barriers/Preferences - 09/05/17 1056      Learning Barriers/Preferences   Learning Barriers  Hearing    Learning Preferences  Written Material;Skilled Demonstration;Pictoral;Group Instruction       Education Topics: Hypertension, Hypertension Reduction -Define heart disease and high blood pressure. Discus how high blood pressure affects the body and ways to reduce high blood pressure.   Exercise and Your Heart -Discuss why it is important to exercise, the FITT principles of exercise, normal and abnormal responses to exercise, and how to exercise safely.   Angina -Discuss definition of angina, causes of angina, treatment of angina, and how to decrease risk of having angina.   Cardiac Medications -Review what the  following cardiac medications are used for, how they affect the body, and side effects that may occur when taking the medications.  Medications include Aspirin, Beta blockers, calcium channel blockers, ACE Inhibitors, angiotensin receptor blockers, diuretics, digoxin, and antihyperlipidemics.   Congestive Heart Failure -Discuss the definition of CHF, how to live with CHF, the signs and symptoms of CHF, and how keep track of weight and sodium intake.   Heart Disease and Intimacy -Discus the effect sexual activity has on the heart, how changes occur during intimacy as we age, and safety during sexual activity.   Smoking Cessation / COPD -Discuss different methods to quit smoking, the health benefits of quitting smoking, and the definition of COPD.   Nutrition I: Fats -Discuss the types of cholesterol, what cholesterol does to the heart, and how cholesterol levels can be controlled.   Nutrition II: Labels -Discuss the different components of food labels and how to read food label   Heart Parts/Heart Disease and PAD -Discuss the anatomy of the heart, the pathway of blood circulation through the heart, and these are affected by heart disease.   Stress I: Signs and Symptoms -Discuss the causes of stress, how stress may lead to anxiety and depression, and ways to limit stress.   Stress II: Relaxation -Discuss different types of relaxation techniques to limit stress.   Warning Signs of Stroke / TIA -Discuss definition of a stroke, what the signs and symptoms are of a stroke, and how to identify when someone is having stroke.   Knowledge Questionnaire Score: Knowledge Questionnaire Score - 09/05/17 1111      Knowledge Questionnaire Score   Pre Score  20/24       Core Components/Risk Factors/Patient Goals at Admission: Personal Goals and Risk Factors at Admission - 09/05/17 1116  Core Components/Risk Factors/Patient Goals on Admission    Weight Management  Yes     Intervention  Weight Management/Obesity: Establish reasonable short term and long term weight goals.;Obesity: Provide education and appropriate resources to help participant work on and attain dietary goals.    Admit Weight  192 lb 3.2 oz (87.2 kg)    Goal Weight: Short Term  183 lb 6.4 oz (83.2 kg)    Goal Weight: Long Term  174 lb 11.2 oz (79.2 kg)    Personal Goal Other  Yes    Personal Goal  Gain strength, Lose 17 pounds, get physically active again.     Intervention  Attend CR 3 x week and supplement with exercise at home 2 x week.     Expected Outcomes  To reach personal goals.        Core Components/Risk Factors/Patient Goals Review:    Core Components/Risk Factors/Patient Goals at Discharge (Final Review):    ITP Comments: ITP Comments    Row Name 09/05/17 1114           ITP Comments  Mrs. Ofarrell is a pleasant 59 yearld female. She has just had CABGx3. She is feeling great. Still a little sore where the incision site is. Other than that she is doing well and is eager to get started.           Comments: Patient arrived for 1st visit/orientation/education at 0800. Patient was referred to CR by Dr. Harl Bowie due to CABGx3 (Z95.1). During orientation advised patient on arrival and appointment times what to wear, what to do before, during and after exercise. Reviewed attendance and class policy. Talked about inclement weather and class consultation policy. Pt is scheduled to return Cardiac Rehab on 09/07/17 at 0815. Pt was advised to come to class 15 minutes before class starts. Patient was also given instructions on meeting with the dietician and attending the Family Structure classes. Discussed RPE/Dpysnea scales. Discussed initial THR and how to find their radial and/or carotid pulse. Discussed the initial exercise prescription and how this effects their progress. Pt is eager to get started. Patient participated in warm up stretches followed by light weights and resistance bands.  Patient was able to complete 6 minute walk test. Patient did not c/o any pain. Patient was measured for the equipment. Discussed equipment safety with patient. Took patient pre-anthropometric measurements. Patient finished visit at 10:30.

## 2017-09-05 NOTE — Progress Notes (Signed)
Cardiac/Pulmonary Rehab Medication Review by a Pharmacist  Does the patient  feel that his/her medications are working for him/her?  Yes but feels furosemide is not working well. May have to increase dose.  Has the patient been experiencing any side effects to the medications prescribed?  no  Does the patient measure his/her own blood pressure or blood glucose at home?  no   Does the patient have any problems obtaining medications due to transportation or finances?   yes  Understanding of regimen: excellent Understanding of indications: good Potential of compliance: excellent  Questions asked to Determine Patient Understanding of Medication Regimen:  1. What is the name of the medication?  2. What is the medication used for?  3. When should it be taken?  4. How much should be taken?  5. How will you take it?  6. What side effects should you report?  Understanding Defined as: Excellent: All questions above are correct Good: Questions 1-4 are correct Fair: Questions 1-2 are correct  Poor: 1 or none of the above questions are correct   Pharmacist comments: Overall, patient has a good understanding of her regimen and seems compliant. She does mention that she does not feel furosemide is working well, is seeing physician tomorrow so he can adjust dose if needed.    Gloria Mccoy 09/05/2017 8:39 AM

## 2017-09-06 ENCOUNTER — Ambulatory Visit (INDEPENDENT_AMBULATORY_CARE_PROVIDER_SITE_OTHER): Payer: Self-pay | Admitting: Thoracic Surgery (Cardiothoracic Vascular Surgery)

## 2017-09-06 ENCOUNTER — Other Ambulatory Visit: Payer: Self-pay

## 2017-09-06 ENCOUNTER — Encounter: Payer: Self-pay | Admitting: Thoracic Surgery (Cardiothoracic Vascular Surgery)

## 2017-09-06 VITALS — BP 121/72 | HR 67 | Resp 18 | Ht 63.0 in | Wt 194.0 lb

## 2017-09-06 DIAGNOSIS — I25119 Atherosclerotic heart disease of native coronary artery with unspecified angina pectoris: Secondary | ICD-10-CM

## 2017-09-06 DIAGNOSIS — Z951 Presence of aortocoronary bypass graft: Secondary | ICD-10-CM

## 2017-09-06 MED ORDER — FUROSEMIDE 40 MG PO TABS
40.0000 mg | ORAL_TABLET | Freq: Every day | ORAL | 1 refills | Status: DC
Start: 2017-09-06 — End: 2019-02-27

## 2017-09-06 NOTE — Progress Notes (Signed)
[address]       [place] [address]             [phone]    HPI: Mrs Gloria Mccoy returns for scheduled follow-up visit  Mrs. Legaspi is a 72 year old woman with a past history of hypertension, tobacco abuse, obesity, arthritis, hypothyroidism, and vertigo.  She presented with a non-ST elevation MI back in January.  She had coronary bypass grafting x3 for severe single-vessel coronary disease on 07/15/2017.  Her postoperative course was uncomplicated and she went home on day 5.  I saw her back in the office on 08/23/2017 she was complaining of incisional pain and also had redness and swelling around her leg incision.  She had a cellulitis of the right leg wound which we treated with Keflex.  She also had mild peripheral edema and I started her on Lasix 20 mg daily.  She now returns for follow-up.  She says that her legs are continuing to swell.  She still has some tenderness around the incision, but the redness is improved.  Past Medical History:  Diagnosis Date  . Arthritis    "hands sometimes" (07/13/2017)  . Coronary artery disease    a. s/p CABG x3 in 06/2017 with LIMA-LAD, SVG-D1, and SVG-RI.    Marland Kitchen Dips tobacco   . Essential hypertension   . Hypothyroid   . Meniere disease   . Obesity   . Osteopenia 01/2012   T score -1.3 FRAX 7.9%/0.6%    Current Outpatient Medications  Medication Sig Dispense Refill  . acetaminophen (TYLENOL) 325 MG tablet Take 2 tablets (650 mg total) by mouth every 6 (six) hours as needed for mild pain.    Marland Kitchen aspirin EC 325 MG EC tablet Take 1 tablet (325 mg total) by mouth daily. 30 tablet 0  . atorvastatin (LIPITOR) 10 MG tablet Take 1 tablet (10 mg total) by mouth daily at 6 PM. 30 tablet 1  . cephALEXin (KEFLEX) 500 MG capsule Take 1 capsule (500 mg total) by mouth 3 (three) times daily. 21 capsule 0  . Cholecalciferol (VITAMIN D PO) Take 1 tablet by mouth daily.     . Cyanocobalamin (VITAMIN B-12 PO) Take 1 tablet by mouth daily.      . fish oil-omega-3 fatty acids 1000 MG capsule Take 1 g by mouth daily.    . furosemide (LASIX) 40 MG tablet Take 1 tablet (40 mg total) by mouth daily. 30 tablet 1  . levothyroxine (SYNTHROID, LEVOTHROID) 75 MCG tablet Take 75 mcg by mouth daily.    . metoprolol tartrate (LOPRESSOR) 25 MG tablet Take 1 tablet (25 mg total) by mouth 2 (two) times daily. 60 tablet 1  . potassium chloride SA (K-DUR,KLOR-CON) 20 MEQ tablet Take 1 tablet (20 mEq total) by mouth daily. For one week then stop. 7 tablet 0  . traMADol (ULTRAM) 50 MG tablet Take 1 tablet (50 mg total) by mouth 2 (two) times daily as needed for moderate pain. 25 tablet 0  . VITAMIN E PO Take 1 tablet by mouth daily.      No current facility-administered medications for this visit.     Physical Exam BP 121/72 (BP Location: Left Arm, Patient Position: Sitting, Cuff Size: Normal)   Pulse 67   Resp 18   Ht 5\' 3"  (1.6 m)   Wt 194 lb (88 kg)   SpO2 94% Comment: RA  BMI 34.26 kg/m  72 year old woman in no acute distress Alert and oriented  x3 with no focal deficits Cardiac regular rate and rhythm normal S1-S2 Lungs diminished at bases but otherwise clear Sternal incision clean dry and intact, sternum stable Right leg incision mild induration no significant erythema, no drainage 2+ edema both ankles  Diagnostic Tests: None today  Impression: Mrs. Reinard is a 72 year old woman who is now almost 2 months out from coronary bypass grafting x3.  She is doing well from a cardiac standpoint.  Infection started cardiac rehab and she said that she was told she did well with that.  She had a cellulitis of her right leg and that appears to be resolved, but she does still have bilateral peripheral edema despite 20 mg of Lasix daily.  I recommended her that we increase the Lasix to 40 mg a day.  I gave her prescription for an additional 30 tablets with 1 refill.  For now she can just take 2 of the tablets that she has.  I also instructed her to  elevate her legs above her heart at night and then at least for an hour or so during the day as well.  I will see her back in 2 weeks to check on her progress.   Melrose Nakayama, MD [facility] [phone]

## 2017-09-07 ENCOUNTER — Encounter (HOSPITAL_COMMUNITY)
Admission: RE | Admit: 2017-09-07 | Discharge: 2017-09-07 | Disposition: A | Discharge status: Home / Self Care | Payer: Medicare HMO | Source: Ambulatory Visit | Attending: Cardiology | Admitting: Cardiology

## 2017-09-07 DIAGNOSIS — Z951 Presence of aortocoronary bypass graft: Secondary | ICD-10-CM | POA: Diagnosis not present

## 2017-09-07 NOTE — Progress Notes (Signed)
Daily Session Note  Patient Details  Name: Gloria Mccoy MRN: [id number] Date of Birth: 1946-05-13 Referring Provider:     CARDIAC REHAB PHASE II ORIENTATION from 09/05/2017 in [place]  Referring Provider  Dr. Harl Bowie      Encounter Date: 09/07/2017  Check In: Session Check In - 09/07/17 0902      Check-In   Location  [facility]    Staff Present  Diane Angelina Pih, MS, EP, [facility], Exercise Physiologist;Lauryn Lizardi Luther Parody, BS, EP, Exercise Physiologist;Debra Wynetta Emery, RN, BSN    Supervising physician immediately available to respond to emergencies  See telemetry face sheet for immediately available MD    Medication changes reported      No    Fall or balance concerns reported     No    Warm-up and Cool-down  Performed as group-led instruction    Resistance Training Performed  Yes    VAD Patient?  No      Pain Assessment   Currently in Pain?  No/denies    Pain Score  0-No pain    Multiple Pain Sites  No       Capillary Blood Glucose: No results found for this or any previous visit (from the past 24 hour(s)).    Social History   Tobacco Use  Smoking Status Never Smoker  Smokeless Tobacco Current User  . Types: Snuff    Goals Met:  Independence with exercise equipment Exercise tolerated well No report of cardiac concerns or symptoms Strength training completed today  Goals Unmet:  Not Applicable  Comments: Check out 915   Dr. Kate Sable is Medical Director for [place] and Pulmonary Rehab.

## 2017-09-09 ENCOUNTER — Other Ambulatory Visit: Payer: Self-pay | Admitting: Student

## 2017-09-09 ENCOUNTER — Encounter (HOSPITAL_COMMUNITY)
Admission: RE | Admit: 2017-09-09 | Discharge: 2017-09-09 | Disposition: A | Discharge status: Home / Self Care | Payer: Medicare HMO | Source: Ambulatory Visit | Attending: Cardiology | Admitting: Cardiology

## 2017-09-09 ENCOUNTER — Other Ambulatory Visit: Payer: Self-pay

## 2017-09-09 DIAGNOSIS — Z951 Presence of aortocoronary bypass graft: Secondary | ICD-10-CM

## 2017-09-09 MED ORDER — METOPROLOL TARTRATE 25 MG PO TABS: 25.0000 mg | ORAL_TABLET | Freq: Two times a day (BID) | ORAL | 6 refills | Status: DC

## 2017-09-09 MED ORDER — ATORVASTATIN CALCIUM 10 MG PO TABS: 10.0000 mg | ORAL_TABLET | Freq: Every day | ORAL | 6 refills | Status: DC

## 2017-09-09 NOTE — Telephone Encounter (Signed)
Patient needs RX's for Lipitor and Lopressor sent to [facility] / tg

## 2017-09-09 NOTE — Telephone Encounter (Signed)
Refill complete to [facility] per pt request

## 2017-09-09 NOTE — Progress Notes (Signed)
Daily Session Note  Patient Details  Name: Gloria Mccoy MRN: [id number] Date of Birth: [birth year] Referring Provider:     CARDIAC REHAB PHASE II ORIENTATION from 09/05/2017 in [place]  Referring Provider  Dr. Harl Bowie      Encounter Date: 09/09/2017  Check In: Session Check In - 09/09/17 0815      Check-In   Location  [facility]    Staff Present  Russella Dar, MS, EP, [facility], Exercise Physiologist;Aron Needles Wynetta Emery, RN, BSN    Supervising physician immediately available to respond to emergencies  See telemetry face sheet for immediately available MD    Medication changes reported      No    Fall or balance concerns reported     No    Warm-up and Cool-down  Performed as group-led instruction    Resistance Training Performed  Yes    VAD Patient?  No      Pain Assessment   Currently in Pain?  No/denies    Pain Score  0-No pain    Multiple Pain Sites  No       Capillary Blood Glucose: No results found for this or any previous visit (from the past 24 hour(s)).    Social History   Tobacco Use  Smoking Status Never Smoker  Smokeless Tobacco Current User  . Types: Snuff    Goals Met:  Independence with exercise equipment Exercise tolerated well No report of cardiac concerns or symptoms Strength training completed today  Goals Unmet:  Not Applicable  Comments: Check out 915.   Dr. Kate Sable is Medical Director for [facility] Cardiac and Pulmonary Rehab.

## 2017-09-09 NOTE — Telephone Encounter (Signed)
Re-sent atorvastatin again

## 2017-09-12 ENCOUNTER — Encounter (HOSPITAL_COMMUNITY)
Admission: RE | Admit: 2017-09-12 | Discharge: 2017-09-12 | Disposition: A | Discharge status: Home / Self Care | Payer: Medicare HMO | Source: Ambulatory Visit | Attending: Cardiology | Admitting: Cardiology

## 2017-09-12 DIAGNOSIS — Z951 Presence of aortocoronary bypass graft: Secondary | ICD-10-CM | POA: Diagnosis not present

## 2017-09-12 NOTE — Progress Notes (Signed)
Daily Session Note  Patient Details  Name: ALETTE KATAOKA MRN: [id number] Date of Birth: 01-Apr-1946 Referring Provider:     CARDIAC REHAB PHASE II ORIENTATION from 09/05/2017 in [place]  Referring Provider  Dr. Harl Bowie      Encounter Date: 09/12/2017  Check In: Session Check In - 09/12/17 0815      Check-In   Location  [facility]    Staff Present  Russella Dar, MS, EP, [facility], Exercise Physiologist;Quianna Avery Wynetta Emery, RN, BSN    Supervising physician immediately available to respond to emergencies  See telemetry face sheet for immediately available MD    Medication changes reported      No    Fall or balance concerns reported     No    Warm-up and Cool-down  Performed as group-led instruction    Resistance Training Performed  Yes    VAD Patient?  No      Pain Assessment   Currently in Pain?  No/denies    Pain Score  0-No pain    Multiple Pain Sites  No       Capillary Blood Glucose: No results found for this or any previous visit (from the past 24 hour(s)).    Social History   Tobacco Use  Smoking Status Never Smoker  Smokeless Tobacco Current User  . Types: Snuff    Goals Met:  Independence with exercise equipment Exercise tolerated well No report of cardiac concerns or symptoms Strength training completed today  Goals Unmet:  Not Applicable  Comments: Check out 1030.   Dr. Kate Sable is Medical Director for [facility] Cardiac and Pulmonary Rehab.

## 2017-09-14 ENCOUNTER — Encounter (HOSPITAL_COMMUNITY)
Admission: RE | Admit: 2017-09-14 | Discharge: 2017-09-14 | Disposition: A | Discharge status: Home / Self Care | Payer: Medicare HMO | Source: Ambulatory Visit | Attending: Cardiology | Admitting: Cardiology

## 2017-09-14 DIAGNOSIS — Z951 Presence of aortocoronary bypass graft: Secondary | ICD-10-CM

## 2017-09-14 NOTE — Progress Notes (Signed)
Daily Session Note  Patient Details  Name: Gloria Mccoy MRN: [id number] Date of Birth: 05-03-1946 Referring Provider:     CARDIAC REHAB PHASE II ORIENTATION from 09/05/2017 in [place]  Referring Provider  Dr. Harl Bowie      Encounter Date: 09/14/2017  Check In: Session Check In - 09/14/17 0815      Check-In   Location  [facility]    Staff Present  Suzanne Boron, BS, EP, Exercise Physiologist;Kasey Hansell Wynetta Emery, RN, BSN    Supervising physician immediately available to respond to emergencies  See telemetry face sheet for immediately available MD    Medication changes reported      No    Fall or balance concerns reported     No    Warm-up and Cool-down  Performed as group-led instruction    Resistance Training Performed  Yes    VAD Patient?  No      Pain Assessment   Currently in Pain?  No/denies    Pain Score  0-No pain    Multiple Pain Sites  No       Capillary Blood Glucose: No results found for this or any previous visit (from the past 24 hour(s)).    Social History   Tobacco Use  Smoking Status Never Smoker  Smokeless Tobacco Current User  . Types: Snuff    Goals Met:  Independence with exercise equipment Exercise tolerated well No report of cardiac concerns or symptoms Strength training completed today  Goals Unmet:  Not Applicable  Comments: Check out 1030.   Dr. Kate Sable is Medical Director for [facility] Cardiac and Pulmonary Rehab.

## 2017-09-16 ENCOUNTER — Encounter (HOSPITAL_COMMUNITY)
Admission: RE | Admit: 2017-09-16 | Discharge: 2017-09-16 | Disposition: A | Discharge status: Home / Self Care | Payer: Medicare HMO | Source: Ambulatory Visit | Attending: Cardiology | Admitting: Cardiology

## 2017-09-16 DIAGNOSIS — Z951 Presence of aortocoronary bypass graft: Secondary | ICD-10-CM

## 2017-09-16 NOTE — Progress Notes (Signed)
Daily Session Note  Patient Details  Name: Gloria Mccoy MRN: [id number] Date of Birth: 1945-09-20 Referring Provider:     CARDIAC REHAB PHASE II ORIENTATION from 09/05/2017 in [place]  Referring Provider  Dr. Harl Bowie      Encounter Date: 09/16/2017  Check In: Session Check In - 09/16/17 1018      Check-In   Location  [facility]    Staff Present  Suzanne Boron, BS, EP, Exercise Physiologist;Debra Wynetta Emery, RN, BSN;Diane Coad, MS, EP, [facility], Exercise Physiologist    Supervising physician immediately available to respond to emergencies  See telemetry face sheet for immediately available MD    Medication changes reported      No    Fall or balance concerns reported     No    Warm-up and Cool-down  Performed as group-led instruction    Resistance Training Performed  Yes    VAD Patient?  No      Pain Assessment   Currently in Pain?  No/denies    Pain Score  0-No pain    Multiple Pain Sites  No       Capillary Blood Glucose: No results found for this or any previous visit (from the past 24 hour(s)).  Exercise Prescription Changes - 09/15/17 1500      Response to Exercise   Blood Pressure (Admit)  146/70    Blood Pressure (Exercise)  130/70    Blood Pressure (Exit)  138/60    Heart Rate (Admit)  70 bpm    Heart Rate (Exercise)  65 bpm    Heart Rate (Exit)  79 bpm    Rating of Perceived Exertion (Exercise)  10    Duration  Continue with 30 min of aerobic exercise without signs/symptoms of physical distress.    Intensity  THRR New [phone]      Progression   Progression  Continue to progress workloads to maintain intensity without signs/symptoms of physical distress.      Resistance Training   Training Prescription  Yes    Weight  1    Reps  10-15      Treadmill   MPH  1.5    Grade  0    Minutes  15    METs  2.1      NuStep   Level  2    SPM  100    Minutes  20    METs  2.1       Social History   Tobacco Use   Smoking Status Never Smoker  Smokeless Tobacco Current User  . Types: Snuff    Goals Met:  Independence with exercise equipment Exercise tolerated well No report of cardiac concerns or symptoms Strength training completed today  Goals Unmet:  Not Applicable  Comments: Check out 915   Dr. Kate Sable is Medical Director for [place] and Pulmonary Rehab.

## 2017-09-19 ENCOUNTER — Encounter (HOSPITAL_COMMUNITY)
Admission: RE | Admit: 2017-09-19 | Discharge: 2017-09-19 | Disposition: A | Discharge status: Home / Self Care | Payer: Medicare HMO | Source: Ambulatory Visit | Attending: Cardiology | Admitting: Cardiology

## 2017-09-19 DIAGNOSIS — Z951 Presence of aortocoronary bypass graft: Secondary | ICD-10-CM | POA: Insufficient documentation

## 2017-09-19 NOTE — Progress Notes (Signed)
Daily Session Note  Patient Details  Name: Gloria Mccoy MRN: [id number] Date of Birth: 1946/04/08 Referring Provider:     CARDIAC REHAB PHASE II ORIENTATION from 09/05/2017 in [place]  Referring Provider  Dr. Harl Bowie      Encounter Date: 09/19/2017  Check In: Session Check In - 09/19/17 0815      Check-In   Location  [facility]    Staff Present  Trayvond Viets Angelina Pih, MS, EP, [facility], Exercise Physiologist;Gregory Luther Parody, BS, EP, Exercise Physiologist;Debra Wynetta Emery, RN, BSN    Supervising physician immediately available to respond to emergencies  See telemetry face sheet for immediately available MD    Medication changes reported      No    Fall or balance concerns reported     No    Warm-up and Cool-down  Performed as group-led instruction    Resistance Training Performed  Yes    VAD Patient?  No      Pain Assessment   Currently in Pain?  No/denies    Pain Score  0-No pain    Multiple Pain Sites  No       Capillary Blood Glucose: No results found for this or any previous visit (from the past 24 hour(s)).    Social History   Tobacco Use  Smoking Status Never Smoker  Smokeless Tobacco Current User  . Types: Snuff    Goals Met:  Independence with exercise equipment Exercise tolerated well No report of cardiac concerns or symptoms Strength training completed today  Goals Unmet:  Not Applicable  Comments:Check out: 0915   Dr. Kate Sable is Medical Director for [place] and Pulmonary Rehab.

## 2017-09-20 ENCOUNTER — Encounter: Payer: Self-pay | Admitting: Thoracic Surgery (Cardiothoracic Vascular Surgery)

## 2017-09-20 ENCOUNTER — Ambulatory Visit (INDEPENDENT_AMBULATORY_CARE_PROVIDER_SITE_OTHER): Payer: Self-pay | Admitting: Thoracic Surgery (Cardiothoracic Vascular Surgery)

## 2017-09-20 ENCOUNTER — Other Ambulatory Visit: Payer: Self-pay

## 2017-09-20 VITALS — BP 129/76 | HR 66 | Resp 18 | Ht 63.0 in | Wt 194.4 lb

## 2017-09-20 DIAGNOSIS — Z951 Presence of aortocoronary bypass graft: Secondary | ICD-10-CM

## 2017-09-20 DIAGNOSIS — I25119 Atherosclerotic heart disease of native coronary artery with unspecified angina pectoris: Secondary | ICD-10-CM

## 2017-09-20 NOTE — Progress Notes (Signed)
Gloria Mccoy [address]       Gloria Mccoy,Gloria Mccoy [id number]             [phone]     HPI: Gloria Mccoy returns for a scheduled follow-up visit  Gloria Mccoy is a 72 year old woman who had coronary bypass grafting x 3 on 07/15/2017.  She presented with a non-ST elevation MI.  Past medical history is also significant for hypertension, tobacco abuse, obesity, arthritis, hypothyroidism, and vertigo.  I saw her back in the office on 08/23/2017 with cellulitis of her right leg incision.  That was treated with Keflex.  She also had some peripheral edema we started her on 20 mg of Lasix daily.  She came back for follow-up on 09/06/2017.  The cellulitis had resolved, but she still had swelling so we increased her Lasix to 40 mg daily.  Since then her edema has improved significantly.  She is not having any anginal pain.  She complains of her sternal incision itching.  She is frustrated that she does not feel like she is back to her baseline in terms of exercise tolerance.  Past Medical History:  Diagnosis Date  . Arthritis    "hands sometimes" (07/13/2017)  . Coronary artery disease    a. s/p CABG x3 in 06/2017 with LIMA-LAD, SVG-D1, and SVG-RI.    Marland Kitchen Dips tobacco   . Essential hypertension   . Hypothyroid   . Meniere disease   . Obesity   . Osteopenia 01/2012   T score -1.3 FRAX 7.9%/0.6%    Current Outpatient Medications  Medication Sig Dispense Refill  . acetaminophen (TYLENOL) 325 MG tablet Take 2 tablets (650 mg total) by mouth every 6 (six) hours as needed for mild pain.    Marland Kitchen aspirin EC 325 MG EC tablet Take 1 tablet (325 mg total) by mouth daily. 30 tablet 0  . atorvastatin (LIPITOR) 10 MG tablet Take 1 tablet (10 mg total) by mouth daily at 6 PM. 30 tablet 6  . Cholecalciferol (VITAMIN D PO) Take 1 tablet by mouth daily.     . Cyanocobalamin (VITAMIN B-12 PO) Take 1 tablet by mouth daily.    . fish oil-omega-3 fatty acids 1000 MG capsule Take 1 g by mouth daily.    . furosemide  (LASIX) 40 MG tablet Take 1 tablet (40 mg total) by mouth daily. 30 tablet 1  . levothyroxine (SYNTHROID, LEVOTHROID) 75 MCG tablet Take 75 mcg by mouth daily.    . metoprolol tartrate (LOPRESSOR) 25 MG tablet Take 1 tablet (25 mg total) by mouth 2 (two) times daily. 60 tablet 6  . VITAMIN E PO Take 1 tablet by mouth daily.      No current facility-administered medications for this visit.     Physical Exam BP 129/76 (BP Location: Left Arm, Patient Position: Sitting, Cuff Size: Large)   Pulse 66   Resp 18   Ht 5\' 3"  (1.6 m)   Wt 194 lb 6.4 oz (88.2 kg)   SpO2 93% Comment: RA  BMI 34.55 kg/m  72 year old woman in no acute distress Alert and oriented x3 with no focal deficits Lungs clear with equal breath sounds bilaterally Cardiac regular rate and rhythm normal S1 and S2 Sternum stable, incision well-healed Leg incision well-healed No peripheral edema  Impression: Gloria Mccoy is a 72 year old woman who presented with a non-ST elevation MI back in January.  She had coronary bypass grafting x3 for severe complex single vessel disease of the LAD involving  2 diagonal branches.  Her exercise tolerance has been improving but slower than she would like it.  I encouraged her to continue to gradually build up but it will probably take about 6 months to get back to her prior level of functioning following an MI and bypass surgery.  She is not having incisional pain.  Her peripheral edema has improved on 40 mg of Lasix daily.  We will leave her on that for now.  She has a follow-up appointment with cardiology next week.  Cellulitis right lower extremity resolved.  Plan: She will follow-up with cardiology.  I will be happy to see her back anytime in the future if I can be of any further assistance with her care  Melrose Nakayama, MD [facility] [phone]

## 2017-09-21 ENCOUNTER — Encounter (HOSPITAL_COMMUNITY)
Admission: RE | Admit: 2017-09-21 | Discharge: 2017-09-21 | Disposition: A | Discharge status: Home / Self Care | Payer: Medicare HMO | Source: Ambulatory Visit | Attending: Cardiology | Admitting: Cardiology

## 2017-09-21 DIAGNOSIS — Z951 Presence of aortocoronary bypass graft: Secondary | ICD-10-CM

## 2017-09-21 NOTE — Progress Notes (Signed)
Daily Session Note  Patient Details  Name: Gloria Mccoy MRN: [id number] Date of Birth: Aug 01, 1945 Referring Provider:     CARDIAC REHAB PHASE II ORIENTATION from 09/05/2017 in [place]  Referring Provider  Dr. Harl Bowie      Encounter Date: 09/21/2017  Check In: Session Check In - 09/21/17 0815      Check-In   Location  [facility]    Staff Present  Diane Angelina Pih, MS, EP, [facility], Exercise Physiologist;Gregory Luther Parody, BS, EP, Exercise Physiologist;Kamrynn Melott Wynetta Emery, RN, BSN    Supervising physician immediately available to respond to emergencies  See telemetry face sheet for immediately available MD    Medication changes reported      No    Fall or balance concerns reported     No    Warm-up and Cool-down  Performed as group-led instruction    Resistance Training Performed  Yes    VAD Patient?  No      Pain Assessment   Currently in Pain?  No/denies    Pain Score  0-No pain    Multiple Pain Sites  No       Capillary Blood Glucose: No results found for this or any previous visit (from the past 24 hour(s)).    Social History   Tobacco Use  Smoking Status Never Smoker  Smokeless Tobacco Former Systems developer  . Types: Snuff    Goals Met:  Independence with exercise equipment Exercise tolerated well No report of cardiac concerns or symptoms Strength training completed today  Goals Unmet:  Not Applicable  Comments: Check out 915.   Dr. Kate Sable is Medical Director for [facility] Cardiac and Pulmonary Rehab.

## 2017-09-22 NOTE — Progress Notes (Signed)
Cardiac Individual Treatment Plan  Patient Details  Name: Gloria Mccoy MRN: [id number] Date of Birth: 1946/03/28 Referring Provider:     CARDIAC REHAB PHASE II ORIENTATION from 09/05/2017 in [place]  Referring Provider  Dr. Harl Bowie      Initial Encounter Date:    CARDIAC REHAB PHASE II ORIENTATION from 09/05/2017 in [place]  Date  09/05/17  Referring Provider  Dr. Harl Bowie      Visit Diagnosis: S/P CABG x 3  Patient's Home Medications on Admission:  Current Outpatient Medications:  .  acetaminophen (TYLENOL) 325 MG tablet, Take 2 tablets (650 mg total) by mouth every 6 (six) hours as needed for mild pain., Disp: , Rfl:  .  aspirin EC 325 MG EC tablet, Take 1 tablet (325 mg total) by mouth daily., Disp: 30 tablet, Rfl: 0 .  atorvastatin (LIPITOR) 10 MG tablet, Take 1 tablet (10 mg total) by mouth daily at 6 PM., Disp: 30 tablet, Rfl: 6 .  Cholecalciferol (VITAMIN D PO), Take 1 tablet by mouth daily. , Disp: , Rfl:  .  Cyanocobalamin (VITAMIN B-12 PO), Take 1 tablet by mouth daily., Disp: , Rfl:  .  fish oil-omega-3 fatty acids 1000 MG capsule, Take 1 g by mouth daily., Disp: , Rfl:  .  furosemide (LASIX) 40 MG tablet, Take 1 tablet (40 mg total) by mouth daily., Disp: 30 tablet, Rfl: 1 .  levothyroxine (SYNTHROID, LEVOTHROID) 75 MCG tablet, Take 75 mcg by mouth daily., Disp: , Rfl:  .  metoprolol tartrate (LOPRESSOR) 25 MG tablet, Take 1 tablet (25 mg total) by mouth 2 (two) times daily., Disp: 60 tablet, Rfl: 6 .  VITAMIN E PO, Take 1 tablet by mouth daily. , Disp: , Rfl:   Past Medical History: Past Medical History:  Diagnosis Date  . Arthritis    "hands sometimes" (07/13/2017)  . Coronary artery disease    a. s/p CABG x3 in 06/2017 with LIMA-LAD, SVG-D1, and SVG-RI.    Marland Kitchen Dips tobacco   . Essential hypertension   . Hypothyroid   . Meniere disease   . Obesity   . Osteopenia 01/2012   T score -1.3 FRAX 7.9%/0.6%     Tobacco Use: Social History   Tobacco Use  Smoking Status Never Smoker  Smokeless Tobacco Former Systems developer  . Types: Snuff    Labs: Recent Review Flowsheet Data    Labs for ITP Cardiac and Pulmonary Rehab Latest Ref Rng & Units 07/15/2017 07/16/2017 07/16/2017 07/16/2017 07/17/2017   Cholestrol 0 - 200 mg/dL - - - - -   LDLCALC 0 - 99 mg/dL - - - - -   HDL >40 mg/dL - - - - -   Trlycerides <150 mg/dL - - - - -   Hemoglobin A1c 4.8 - 5.6 % - - - - -   PHART 7.350 - 7.450 7.292(L) 7.325(L) 7.313(L) - -   PCO2ART 32.0 - 48.0 mmHg 44.4 43.2 46.5 - -   HCO3 20.0 - 28.0 mmol/L 21.1 22.2 23.3 - -   TCO2 22 - 32 mmol/L 22 23 25 25 28    ACIDBASEDEF 0.0 - 2.0 mmol/L 5.0(H) 3.0(H) 3.0(H) - -   O2SAT % 96.0 97.0 99.0 - -      Capillary Blood Glucose: Lab Results  Component Value Date   GLUCAP 124 (H) 07/17/2017   GLUCAP 135 (H) 07/17/2017   GLUCAP 144 (H) 07/17/2017   GLUCAP 120 (H) 07/17/2017   GLUCAP 99 07/17/2017  Exercise Target Goals:    Exercise Program Goal: Individual exercise prescription set using results from initial 6 min walk test and THRR while considering  patient's activity barriers and safety.   Exercise Prescription Goal: Starting with aerobic activity 30 plus minutes a day, 3 days per week for initial exercise prescription. Provide home exercise prescription and guidelines that participant acknowledges understanding prior to discharge.  Activity Barriers & Risk Stratification: Activity Barriers & Cardiac Risk Stratification - 09/05/17 1018      Activity Barriers & Cardiac Risk Stratification   Activity Barriers  Deconditioning    Cardiac Risk Stratification  High       6 Minute Walk: 6 Minute Walk    Row Name 09/05/17 1018         6 Minute Walk   Phase  Initial     Distance  1050 feet     Distance % Change  0 %     Distance Feet Change  0 ft     Walk Time  6 minutes     # of Rest Breaks  0     MPH  1.98     METS  2.51     RPE  9      Perceived Dyspnea   9     VO2 Peak  7.06     Symptoms  No     Resting HR  73 bpm     Resting BP  110/70     Resting Oxygen Saturation   94 %     Exercise Oxygen Saturation  during 6 min walk  88 %     Max Ex. HR  107 bpm     Max Ex. BP  140/72     2 Minute Post BP  116/72        Oxygen Initial Assessment:   Oxygen Re-Evaluation:   Oxygen Discharge (Final Oxygen Re-Evaluation):   Initial Exercise Prescription: Initial Exercise Prescription - 09/05/17 1000      Date of Initial Exercise RX and Referring Provider   Date  09/05/17    Referring Provider  Dr. Harl Bowie      Treadmill   MPH  1.3    Grade  0    Minutes  15    METs  1.9      NuStep   Level  2    SPM  55    Minutes  20    METs  1.8      Prescription Details   Frequency (times per week)  3    Duration  Progress to 30 minutes of continuous aerobic without signs/symptoms of physical distress      Intensity   THRR 40-80% of Max Heartrate  [phone]    Ratings of Perceived Exertion  11-13    Perceived Dyspnea  0-4      Progression   Progression  Continue progressive overload as per policy without signs/symptoms or physical distress.      Resistance Training   Training Prescription  Yes    Weight  1    Reps  10-15       Perform Capillary Blood Glucose checks as needed.  Exercise Prescription Changes:  Exercise Prescription Changes    Row Name 09/15/17 1500             Response to Exercise   Blood Pressure (Admit)  146/70       Blood Pressure (Exercise)  130/70       Blood Pressure (  Exit)  138/60       Heart Rate (Admit)  70 bpm       Heart Rate (Exercise)  65 bpm       Heart Rate (Exit)  79 bpm       Rating of Perceived Exertion (Exercise)  10       Duration  Continue with 30 min of aerobic exercise without signs/symptoms of physical distress.       Intensity  THRR New [phone]         Progression   Progression  Continue to progress workloads to maintain intensity without  signs/symptoms of physical distress.         Resistance Training   Training Prescription  Yes       Weight  1       Reps  10-15         Treadmill   MPH  1.5       Grade  0       Minutes  15       METs  2.1         NuStep   Level  2       SPM  100       Minutes  20       METs  2.1          Exercise Comments:  Exercise Comments    Row Name 09/15/17 1538           Exercise Comments  Patient is doing very well in CR and has only just started but has increased her speed on the treadmill and her SPMs on the nustep substantially. patient will continued to be monitored over the course of the program by myself and the staff           Exercise Goals and Review:  Exercise Goals    Row Name 09/05/17 1019             Exercise Goals   Increase Physical Activity  Yes       Intervention  Provide advice, education, support and counseling about physical activity/exercise needs.;Develop an individualized exercise prescription for aerobic and resistive training based on initial evaluation findings, risk stratification, comorbidities and participant's personal goals.       Expected Outcomes  Short Term: Attend rehab on a regular basis to increase amount of physical activity.       Increase Strength and Stamina  Yes       Intervention  Develop an individualized exercise prescription for aerobic and resistive training based on initial evaluation findings, risk stratification, comorbidities and participant's personal goals.;Provide advice, education, support and counseling about physical activity/exercise needs.       Expected Outcomes  Short Term: Increase workloads from initial exercise prescription for resistance, speed, and METs.       Able to understand and use rate of perceived exertion (RPE) scale  Yes       Intervention  Provide education and explanation on how to use RPE scale       Expected Outcomes  Short Term: Able to use RPE daily in rehab to express subjective intensity  level;Long Term:  Able to use RPE to guide intensity level when exercising independently       Able to understand and use Dyspnea scale  Yes       Intervention  Provide education and explanation on how to use Dyspnea scale       Expected Outcomes  Short  Term: Able to use Dyspnea scale daily in rehab to express subjective sense of shortness of breath during exertion;Long Term: Able to use Dyspnea scale to guide intensity level when exercising independently       Knowledge and understanding of Target Heart Rate Range (THRR)  Yes       Intervention  Provide education and explanation of THRR including how the numbers were predicted and where they are located for reference       Expected Outcomes  Short Term: Able to state/look up THRR;Long Term: Able to use THRR to govern intensity when exercising independently;Short Term: Able to use daily as guideline for intensity in rehab       Able to check pulse independently  Yes       Intervention  Provide education and demonstration on how to check pulse in carotid and radial arteries.;Review the importance of being able to check your own pulse for safety during independent exercise       Expected Outcomes  Short Term: Able to explain why pulse checking is important during independent exercise;Long Term: Able to check pulse independently and accurately       Understanding of Exercise Prescription  Yes       Intervention  Provide education, explanation, and written materials on patient's individual exercise prescription       Expected Outcomes  Short Term: Able to explain program exercise prescription;Long Term: Able to explain home exercise prescription to exercise independently          Exercise Goals Re-Evaluation : Exercise Goals Re-Evaluation    Row Name 09/15/17 1537             Exercise Goal Re-Evaluation   Exercise Goals Review  Increase Physical Activity;Increase Strength and Stamina;Able to understand and use rate of perceived exertion (RPE)  scale;Able to check pulse independently;Knowledge and understanding of Target Heart Rate Range (THRR);Able to understand and use Dyspnea scale;Understanding of Exercise Prescription       Comments  Patient is doing very well in CR and has only just started but has increased her speed on the treadmill and her SPMs on the nustep substantially. patient will continued to be monitored over the course of the program by myself and the staff        Expected Outcomes  Patient wishes to get strength back and to lose weight           Discharge Exercise Prescription (Final Exercise Prescription Changes): Exercise Prescription Changes - 09/15/17 1500      Response to Exercise   Blood Pressure (Admit)  146/70    Blood Pressure (Exercise)  130/70    Blood Pressure (Exit)  138/60    Heart Rate (Admit)  70 bpm    Heart Rate (Exercise)  65 bpm    Heart Rate (Exit)  79 bpm    Rating of Perceived Exertion (Exercise)  10    Duration  Continue with 30 min of aerobic exercise without signs/symptoms of physical distress.    Intensity  THRR New [phone]      Progression   Progression  Continue to progress workloads to maintain intensity without signs/symptoms of physical distress.      Resistance Training   Training Prescription  Yes    Weight  1    Reps  10-15      Treadmill   MPH  1.5    Grade  0    Minutes  15    METs  2.1  NuStep   Level  2    SPM  100    Minutes  20    METs  2.1       Nutrition:  Target Goals: Understanding of nutrition guidelines, daily intake of sodium 1500mg , cholesterol 200mg , calories 30% from fat and 7% or less from saturated fats, daily to have 5 or more servings of fruits and vegetables.  Biometrics: Pre Biometrics - 09/05/17 1019      Pre Biometrics   Height  5\' 2"  (1.575 m)    Weight  192 lb 3.2 oz (87.2 kg)    Waist Circumference  40 inches    Hip Circumference  41 inches    Waist to Hip Ratio  0.98 %    BMI (Calculated)  35.14    Triceps  Skinfold  20 mm    % Body Fat  43.6 %    Grip Strength  49.4 kg    Flexibility  11.33 in    Single Leg Stand  3 seconds        Nutrition Therapy Plan and Nutrition Goals: Nutrition Therapy & Goals - 09/22/17 0939      Nutrition Therapy   RD appointment deferred  Yes      Personal Nutrition Goals   Personal Goal #2  Patient is completley changed her diet. Eating heart healthy    Additional Goals?  No    Comments  Patient says she is eating heart healthy.        Nutrition Assessments: Nutrition Assessments - 09/05/17 1116      MEDFICTS Scores   Pre Score  9       Nutrition Goals Re-Evaluation:   Nutrition Goals Discharge (Final Nutrition Goals Re-Evaluation):   Psychosocial: Target Goals: Acknowledge presence or absence of significant depression and/or stress, maximize coping skills, provide positive support system. Participant is able to verbalize types and ability to use techniques and skills needed for reducing stress and depression.  Initial Review & Psychosocial Screening: Initial Psych Review & Screening - 09/05/17 1120      Initial Review   Current issues with  Current Sleep Concerns      Family Dynamics   Good Support System?  Yes      Barriers   Psychosocial barriers to participate in program  Psychosocial barriers identified (see note)      Screening Interventions   Interventions  Encouraged to exercise       Quality of Life Scores: Quality of Life - 09/05/17 1021      Quality of Life Scores   Health/Function Pre  19.97 %    Socioeconomic Pre  17.5 %    Psych/Spiritual Pre  20 %    Family Pre  17.8 %    GLOBAL Pre  19.15 %      Scores of 19 and below usually indicate a poorer quality of life in these areas.  A difference of  2-3 points is a clinically meaningful difference.  A difference of 2-3 points in the total score of the Quality of Life Index has been associated with significant improvement in overall quality of life, self-image,  physical symptoms, and general health in studies assessing change in quality of life.  PHQ-9: Recent Review Flowsheet Data    Depression screen [facility] 2/9 09/05/2017   Decreased Interest 0   Down, Depressed, Hopeless 0   PHQ - 2 Score 0   Altered sleeping 2   Tired, decreased energy 2   Change in appetite  0   Feeling bad or failure about yourself  0   Trouble concentrating 0   Moving slowly or fidgety/restless 0   Suicidal thoughts 0   PHQ-9 Score 4   Difficult doing work/chores Not difficult at all     Interpretation of Total Score  Total Score Depression Severity:  1-4 = Minimal depression, 5-9 = Mild depression, 10-14 = Moderate depression, 15-19 = Moderately severe depression, 20-27 = Severe depression   Psychosocial Evaluation and Intervention: Psychosocial Evaluation - 09/05/17 1121      Psychosocial Evaluation & Interventions   Interventions  Encouraged to exercise with the program and follow exercise prescription    Continue Psychosocial Services   Follow up required by staff       Psychosocial Re-Evaluation: Psychosocial Re-Evaluation    [place] Name 09/22/17 0943             Psychosocial Re-Evaluation   Current issues with  Current Sleep Concerns       Comments  Patient's initial QOL was 19.15 and her PHQ-9 was 4 with current sleep issues.        Expected Outcomes  Patient will have no additional psychosocial issues identified at discharge and her sleep issues will be managed.        Interventions  Stress management education;Encouraged to attend Cardiac Rehabilitation for the exercise;Relaxation education       Continue Psychosocial Services   No Follow up required          Psychosocial Discharge (Final Psychosocial Re-Evaluation): Psychosocial Re-Evaluation - 09/22/17 0943      Psychosocial Re-Evaluation   Current issues with  Current Sleep Concerns    Comments  Patient's initial QOL was 19.15 and her PHQ-9 was 4 with current sleep issues.     Expected  Outcomes  Patient will have no additional psychosocial issues identified at discharge and her sleep issues will be managed.     Interventions  Stress management education;Encouraged to attend Cardiac Rehabilitation for the exercise;Relaxation education    Continue Psychosocial Services   No Follow up required       Vocational Rehabilitation: Provide vocational rehab assistance to qualifying candidates.   Vocational Rehab Evaluation & Intervention: Vocational Rehab - 09/05/17 1113      Initial Vocational Rehab Evaluation & Intervention   Assessment shows need for Vocational Rehabilitation  No       Education: Education Goals: Education classes will be provided on a weekly basis, covering required topics. Participant will state understanding/return demonstration of topics presented.  Learning Barriers/Preferences: Learning Barriers/Preferences - 09/05/17 1056      Learning Barriers/Preferences   Learning Barriers  Hearing    Learning Preferences  Written Material;Skilled Demonstration;Pictoral;Group Instruction       Education Topics: Hypertension, Hypertension Reduction -Define heart disease and high blood pressure. Discus how high blood pressure affects the body and ways to reduce high blood pressure.   Exercise and Your Heart -Discuss why it is important to exercise, the FITT principles of exercise, normal and abnormal responses to exercise, and how to exercise safely.   Angina -Discuss definition of angina, causes of angina, treatment of angina, and how to decrease risk of having angina.   Cardiac Medications -Review what the following cardiac medications are used for, how they affect the body, and side effects that may occur when taking the medications.  Medications include Aspirin, Beta blockers, calcium channel blockers, ACE Inhibitors, angiotensin receptor blockers, diuretics, digoxin, and antihyperlipidemics.   Congestive Heart Failure -Discuss the  definition of  CHF, how to live with CHF, the signs and symptoms of CHF, and how keep track of weight and sodium intake.   Heart Disease and Intimacy -Discus the effect sexual activity has on the heart, how changes occur during intimacy as we age, and safety during sexual activity.   Smoking Cessation / COPD -Discuss different methods to quit smoking, the health benefits of quitting smoking, and the definition of COPD.   CARDIAC REHAB PHASE II EXERCISE from 09/21/2017 in [place]  Date  09/08/17  Educator  DC  Instruction Review Code  2- Demonstrated Understanding      Nutrition I: Fats -Discuss the types of cholesterol, what cholesterol does to the heart, and how cholesterol levels can be controlled.   CARDIAC REHAB PHASE II EXERCISE from 09/21/2017 in [place]  Date  09/14/17  Educator  DC  Instruction Review Code  2- Demonstrated Understanding      Nutrition II: Labels -Discuss the different components of food labels and how to read food label   CARDIAC REHAB PHASE II EXERCISE from 09/21/2017 in [place]  Date  09/21/17  Educator  DJ  Instruction Review Code  2- Demonstrated Understanding      Heart Parts/Heart Disease and PAD -Discuss the anatomy of the heart, the pathway of blood circulation through the heart, and these are affected by heart disease.   Stress I: Signs and Symptoms -Discuss the causes of stress, how stress may lead to anxiety and depression, and ways to limit stress.   Stress II: Relaxation -Discuss different types of relaxation techniques to limit stress.   Warning Signs of Stroke / TIA -Discuss definition of a stroke, what the signs and symptoms are of a stroke, and how to identify when someone is having stroke.   Knowledge Questionnaire Score: Knowledge Questionnaire Score - 09/05/17 1111      Knowledge Questionnaire Score   Pre Score  20/24       Core Components/Risk  Factors/Patient Goals at Admission: Personal Goals and Risk Factors at Admission - 09/05/17 1116      Core Components/Risk Factors/Patient Goals on Admission    Weight Management  Yes    Intervention  Weight Management/Obesity: Establish reasonable short term and long term weight goals.;Obesity: Provide education and appropriate resources to help participant work on and attain dietary goals.    Admit Weight  192 lb 3.2 oz (87.2 kg)    Goal Weight: Short Term  183 lb 6.4 oz (83.2 kg)    Goal Weight: Long Term  174 lb 11.2 oz (79.2 kg)    Personal Goal Other  Yes    Personal Goal  Gain strength, Lose 17 pounds, get physically active again.     Intervention  Attend CR 3 x week and supplement with exercise at home 2 x week.     Expected Outcomes  To reach personal goals.        Core Components/Risk Factors/Patient Goals Review:  Goals and Risk Factor Review    Row Name 09/22/17 0940             Core Components/Risk Factors/Patient Goals Review   Personal Goals Review  Weight Management/Obesity Get strength back; lose 17 lbs; get physically active.       Review  Patient has completed 8 sessions maintaining her weight. She is doing well in the program with some progression. She says it is too soon to tell if the program is  making a difference. She hope to meet her goals as the continues the program. Will continue to monitor for progress.        Expected Outcomes  Patient will continue to attend sessions and complete the program.           Core Components/Risk Factors/Patient Goals at Discharge (Final Review):  Goals and Risk Factor Review - 09/22/17 0940      Core Components/Risk Factors/Patient Goals Review   Personal Goals Review  Weight Management/Obesity Get strength back; lose 17 lbs; get physically active.    Review  Patient has completed 8 sessions maintaining her weight. She is doing well in the program with some progression. She says it is too soon to tell if the program is  making a difference. She hope to meet her goals as the continues the program. Will continue to monitor for progress.     Expected Outcomes  Patient will continue to attend sessions and complete the program.        ITP Comments: ITP Comments    Row Name 09/05/17 1114           ITP Comments  Mrs. Soutar is a pleasant 60 yearld female. She has just had CABGx3. She is feeling great. Still a little sore where the incision site is. Other than that she is doing well and is eager to get started.           Comments: ITP 30 Day REVIEW Patient doing well in the program. Will continue to monitor for progress.

## 2017-09-23 ENCOUNTER — Encounter (HOSPITAL_COMMUNITY)
Admission: RE | Admit: 2017-09-23 | Discharge: 2017-09-23 | Disposition: A | Discharge status: Home / Self Care | Payer: Medicare HMO | Source: Ambulatory Visit | Attending: Cardiology | Admitting: Cardiology

## 2017-09-23 DIAGNOSIS — Z951 Presence of aortocoronary bypass graft: Secondary | ICD-10-CM | POA: Diagnosis not present

## 2017-09-23 NOTE — Progress Notes (Signed)
Daily Session Note  Patient Details  Name: Gloria Mccoy MRN: [id number] Date of Birth: 1945-12-11 Referring Provider:     CARDIAC REHAB PHASE II ORIENTATION from 09/05/2017 in [name]  Referring Provider  Dr. Harl Bowie      Encounter Date: 09/23/2017  Check In: Session Check In - 09/23/17 0815      Check-In   Location  [facility]    Staff Present  Diane Angelina Pih, MS, EP, [facility], Exercise Physiologist;Gregory Luther Parody, BS, EP, Exercise Physiologist;Ariatna Jester Wynetta Emery, RN, BSN    Supervising physician immediately available to respond to emergencies  See telemetry face sheet for immediately available MD    Medication changes reported      No    Fall or balance concerns reported     No    Warm-up and Cool-down  Performed as group-led instruction    Resistance Training Performed  Yes    VAD Patient?  No      Pain Assessment   Currently in Pain?  No/denies    Pain Score  0-No pain    Multiple Pain Sites  No       Capillary Blood Glucose: No results found for this or any previous visit (from the past 24 hour(s)).    Social History   Tobacco Use  Smoking Status Never Smoker  Smokeless Tobacco Former Systems developer  . Types: Snuff    Goals Met:  Independence with exercise equipment Exercise tolerated well No report of cardiac concerns or symptoms Strength training completed today  Goals Unmet:  Not Applicable  Comments: Check out 915.   Dr. Kate Sable is Medical Director for [facility] Cardiac and Pulmonary Re[facility].

## 2017-09-26 ENCOUNTER — Encounter (HOSPITAL_COMMUNITY)
Admission: RE | Admit: 2017-09-26 | Discharge: 2017-09-26 | Disposition: A | Discharge status: Home / Self Care | Payer: Medicare HMO | Source: Ambulatory Visit | Attending: Cardiology | Admitting: Cardiology

## 2017-09-26 DIAGNOSIS — Z951 Presence of aortocoronary bypass graft: Secondary | ICD-10-CM | POA: Diagnosis not present

## 2017-09-26 NOTE — Progress Notes (Signed)
Daily Session Note  Patient Details  Name: DANYLA WATTLEY MRN: [id number] Date of Birth: Mar 06, 1946 Referring Provider:     CARDIAC REHAB PHASE II ORIENTATION from 09/05/2017 in [place]  Referring Provider  Dr. Harl Bowie      Encounter Date: 09/26/2017  Check In: Session Check In - 09/26/17 0815      Check-In   Location  [facility]    Staff Present  Russella Dar, MS, EP, [facility], Exercise Physiologist;Gregory Luther Parody, BS, EP, Exercise Physiologist    Supervising physician immediately available to respond to emergencies  See telemetry face sheet for immediately available MD    Medication changes reported      No    Fall or balance concerns reported     No    Warm-up and Cool-down  Performed as group-led instruction    Resistance Training Performed  Yes    VAD Patient?  No      Pain Assessment   Currently in Pain?  No/denies    Pain Score  0-No pain    Multiple Pain Sites  No       Capillary Blood Glucose: No results found for this or any previous visit (from the past 24 hour(s)).    Social History   Tobacco Use  Smoking Status Never Smoker  Smokeless Tobacco Former Systems developer  . Types: Snuff    Goals Met:  Independence with exercise equipment Exercise tolerated well No report of cardiac concerns or symptoms Strength training completed today  Goals Unmet:  Not Applicable  Comments: Check out: 0915   Dr. Kate Sable is Medical Director for [place] and Pulmonary Rehab.

## 2017-09-28 ENCOUNTER — Encounter (HOSPITAL_COMMUNITY)
Admission: RE | Admit: 2017-09-28 | Discharge: 2017-09-28 | Disposition: A | Discharge status: Home / Self Care | Payer: Medicare HMO | Source: Ambulatory Visit | Attending: Cardiology | Admitting: Cardiology

## 2017-09-28 DIAGNOSIS — Z951 Presence of aortocoronary bypass graft: Secondary | ICD-10-CM | POA: Diagnosis not present

## 2017-09-28 NOTE — Progress Notes (Signed)
Daily Session Note  Patient Details  Name: NEKESHA FONT MRN: [id number] Date of Birth: [birth year] Referring Provider:     CARDIAC REHAB PHASE II ORIENTATION from 09/05/2017 in [place]  Referring Provider  Dr. Harl Bowie      Encounter Date: 09/28/2017  Check In: Session Check In - 09/28/17 0850      Check-In   Location  [facility]    Staff Present  Diane Angelina Pih, MS, EP, [facility], Exercise Physiologist;Devonda Pequignot Luther Parody, BS, EP, Exercise Physiologist;Debra Wynetta Emery, RN, BSN    Supervising physician immediately available to respond to emergencies  See telemetry face sheet for immediately available MD    Medication changes reported      No    Fall or balance concerns reported     No    Warm-up and Cool-down  Performed as group-led instruction    Resistance Training Performed  Yes    VAD Patient?  No      Pain Assessment   Currently in Pain?  No/denies    Pain Score  0-No pain    Multiple Pain Sites  No       Capillary Blood Glucose: No results found for this or any previous visit (from the past 24 hour(s)).    Social History   Tobacco Use  Smoking Status Never Smoker  Smokeless Tobacco Former Systems developer  . Types: Snuff    Goals Met:  Independence with exercise equipment Exercise tolerated well No report of cardiac concerns or symptoms Strength training completed today  Goals Unmet:  Not Applicable  Comments: Check out 915   Dr. Kate Sable is Medical Director for [place] and Pulmonary Rehab.

## 2017-09-30 ENCOUNTER — Encounter (HOSPITAL_COMMUNITY)
Admission: RE | Admit: 2017-09-30 | Discharge: 2017-09-30 | Disposition: A | Discharge status: Home / Self Care | Payer: Medicare HMO | Source: Ambulatory Visit | Attending: Cardiology | Admitting: Cardiology

## 2017-09-30 DIAGNOSIS — Z951 Presence of aortocoronary bypass graft: Secondary | ICD-10-CM

## 2017-09-30 NOTE — Progress Notes (Signed)
Daily Session Note  Patient Details  Name: Gloria Mccoy MRN: [id number] Date of Birth: 1946-06-14 Referring Provider:     CARDIAC REHAB PHASE II ORIENTATION from 09/05/2017 in [place]  Referring Provider  Dr. Harl Bowie      Encounter Date: 09/30/2017  Check In: Session Check In - 09/30/17 0815      Check-In   Location  [facility]    Staff Present  Suzanne Boron, BS, EP, Exercise Physiologist;Kasondra Junod Wynetta Emery, RN, BSN    Supervising physician immediately available to respond to emergencies  See telemetry face sheet for immediately available MD    Medication changes reported      No    Fall or balance concerns reported     No    Warm-up and Cool-down  Performed as group-led instruction    Resistance Training Performed  Yes    VAD Patient?  No      Pain Assessment   Currently in Pain?  No/denies    Pain Score  0-No pain    Multiple Pain Sites  No       Capillary Blood Glucose: No results found for this or any previous visit (from the past 24 hour(s)).    Social History   Tobacco Use  Smoking Status Never Smoker  Smokeless Tobacco Former Systems developer  . Types: Snuff    Goals Met:  Independence with exercise equipment Exercise tolerated well No report of cardiac concerns or symptoms Strength training completed today  Goals Unmet:  Not Applicable  Comments: Check out 915.   Dr. Kate Sable is Medical Director for [facility] Cardiac and Pulmonary Rehab.

## 2017-10-03 ENCOUNTER — Encounter (HOSPITAL_COMMUNITY)
Admission: RE | Admit: 2017-10-03 | Discharge: 2017-10-03 | Disposition: A | Discharge status: Home / Self Care | Payer: Medicare HMO | Source: Ambulatory Visit | Attending: Cardiology | Admitting: Cardiology

## 2017-10-03 DIAGNOSIS — Z951 Presence of aortocoronary bypass graft: Secondary | ICD-10-CM | POA: Diagnosis not present

## 2017-10-03 NOTE — Progress Notes (Signed)
Daily Session Note  Patient Details  Name: Gloria Mccoy MRN: [id number] Date of Birth: [birth year] Referring Provider:     CARDIAC REHAB PHASE II ORIENTATION from 09/05/2017 in [place]  Referring Provider  Dr. Harl Bowie      Encounter Date: 10/03/2017  Check In: Session Check In - 10/03/17 0815      Check-In   Location  [facility]    Staff Present  Aundra Dubin, RN, BSN;Gregory Luther Parody, BS, EP, Exercise Physiologist    Supervising physician immediately available to respond to emergencies  See telemetry face sheet for immediately available MD    Medication changes reported      No    Fall or balance concerns reported     No    Warm-up and Cool-down  Performed as group-led instruction    Resistance Training Performed  Yes    VAD Patient?  No      Pain Assessment   Currently in Pain?  No/denies    Pain Score  0-No pain    Multiple Pain Sites  No       Capillary Blood Glucose: No results found for this or any previous visit (from the past 24 hour(s)).    Social History   Tobacco Use  Smoking Status Never Smoker  Smokeless Tobacco Former Systems developer  . Types: Snuff    Goals Met:  Independence with exercise equipment Exercise tolerated well No report of cardiac concerns or symptoms Strength training completed today  Goals Unmet:  Not Applicable  Comments: Check out 915.   Dr. Kate Sable is Medical Director for [facility] Cardiac and Pulmonary Rehab.

## 2017-10-05 ENCOUNTER — Encounter (HOSPITAL_COMMUNITY)
Admission: RE | Admit: 2017-10-05 | Discharge: 2017-10-05 | Disposition: A | Discharge status: Home / Self Care | Payer: Medicare HMO | Source: Ambulatory Visit | Attending: Cardiology | Admitting: Cardiology

## 2017-10-05 DIAGNOSIS — Z951 Presence of aortocoronary bypass graft: Secondary | ICD-10-CM | POA: Diagnosis not present

## 2017-10-05 NOTE — Progress Notes (Signed)
Daily Session Note  Patient Details  Name: SHRITHA BRESEE MRN: [id number] Date of Birth: 1946/06/05 Referring Provider:     CARDIAC REHAB PHASE II ORIENTATION from 09/05/2017 in [place]  Referring Provider  Dr. Harl Bowie      Encounter Date: 10/05/2017  Check In: Session Check In - 10/05/17 0821      Check-In   Location  [facility]    Staff Present  Aundra Dubin, RN, BSN;Anorah Trias Luther Parody, BS, EP, Exercise Physiologist    Supervising physician immediately available to respond to emergencies  See telemetry face sheet for immediately available MD    Medication changes reported      No    Fall or balance concerns reported     No    Warm-up and Cool-down  Performed as group-led instruction    Resistance Training Performed  Yes    VAD Patient?  No      Pain Assessment   Currently in Pain?  No/denies    Pain Score  0-No pain    Multiple Pain Sites  No       Capillary Blood Glucose: No results found for this or any previous visit (from the past 24 hour(s)).    Social History   Tobacco Use  Smoking Status Never Smoker  Smokeless Tobacco Former Systems developer  . Types: Snuff    Goals Met:  Independence with exercise equipment Exercise tolerated well No report of cardiac concerns or symptoms Strength training completed today  Goals Unmet:  Not Applicable  Comments: Check out 915   Dr. Kate Sable is Medical Director for [place] and Pulmonary Rehab.

## 2017-10-07 ENCOUNTER — Encounter (HOSPITAL_COMMUNITY)
Admission: RE | Admit: 2017-10-07 | Discharge: 2017-10-07 | Disposition: A | Discharge status: Home / Self Care | Payer: Medicare HMO | Source: Ambulatory Visit | Attending: Cardiology | Admitting: Cardiology

## 2017-10-07 DIAGNOSIS — Z951 Presence of aortocoronary bypass graft: Secondary | ICD-10-CM | POA: Diagnosis not present

## 2017-10-07 NOTE — Progress Notes (Signed)
Daily Session Note  Patient Details  Name: Gloria Mccoy MRN: [id number] Date of Birth: April 06, 1946 Referring Provider:     CARDIAC REHAB PHASE II ORIENTATION from 09/05/2017 in [place]  Referring Provider  Dr. Harl Bowie      Encounter Date: 10/07/2017  Check In: Session Check In - 10/07/17 0815      Check-In   Location  [facility]    Staff Present  Aundra Dubin, RN, BSN;Gregory Luther Parody, BS, EP, Exercise Physiologist    Supervising physician immediately available to respond to emergencies  See telemetry face sheet for immediately available MD    Medication changes reported      No    Fall or balance concerns reported     No    Warm-up and Cool-down  Performed as group-led instruction    Resistance Training Performed  Yes    VAD Patient?  No      Pain Assessment   Currently in Pain?  No/denies    Pain Score  0-No pain    Multiple Pain Sites  No       Capillary Blood Glucose: No results found for this or any previous visit (from the past 24 hour(s)).    Social History   Tobacco Use  Smoking Status Never Smoker  Smokeless Tobacco Former Systems developer  . Types: Snuff    Goals Met:  Independence with exercise equipment Exercise tolerated well No report of cardiac concerns or symptoms Strength training completed today  Goals Unmet:  Not Applicable  Comments: Check out 915.   Dr. Kate Sable is Medical Director for [facility] Cardiac and Pulmonary Rehab.

## 2017-10-10 ENCOUNTER — Encounter (HOSPITAL_COMMUNITY)
Admission: RE | Admit: 2017-10-10 | Discharge: 2017-10-10 | Disposition: A | Discharge status: Home / Self Care | Payer: Medicare HMO | Source: Ambulatory Visit | Attending: Cardiology | Admitting: Cardiology

## 2017-10-10 DIAGNOSIS — Z951 Presence of aortocoronary bypass graft: Secondary | ICD-10-CM | POA: Diagnosis not present

## 2017-10-10 NOTE — Progress Notes (Signed)
Daily Session Note  Patient Details  Name: Gloria Mccoy MRN: [id number] Date of Birth: Apr 23, 1946 Referring Provider:     CARDIAC REHAB PHASE II ORIENTATION from 09/05/2017 in [place]  Referring Provider  Dr. Harl Bowie      Encounter Date: 10/10/2017  Check In: Session Check In - 10/10/17 0815      Check-In   Location  [facility]    Staff Present  Aundra Dubin, RN, BSN;Gregory Luther Parody, BS, EP, Exercise Physiologist    Supervising physician immediately available to respond to emergencies  See telemetry face sheet for immediately available MD    Medication changes reported      No    Fall or balance concerns reported     No    Warm-up and Cool-down  Performed as group-led instruction    Resistance Training Performed  Yes    VAD Patient?  No      Pain Assessment   Currently in Pain?  No/denies    Pain Score  0-No pain    Multiple Pain Sites  No       Capillary Blood Glucose: No results found for this or any previous visit (from the past 24 hour(s)).    Social History   Tobacco Use  Smoking Status Never Smoker  Smokeless Tobacco Former Systems developer  . Types: Snuff    Goals Met:  Independence with exercise equipment Exercise tolerated well No report of cardiac concerns or symptoms Strength training completed today  Goals Unmet:  Not Applicable  Comments: Check out 915.   Dr. Kate Sable is Medical Director for [facility] Cardiac and Pulmonary Rehab.

## 2017-10-12 ENCOUNTER — Encounter (HOSPITAL_COMMUNITY)
Admission: RE | Admit: 2017-10-12 | Discharge: 2017-10-12 | Disposition: A | Discharge status: Home / Self Care | Payer: Medicare HMO | Source: Ambulatory Visit | Attending: Cardiology | Admitting: Cardiology

## 2017-10-12 DIAGNOSIS — Z951 Presence of aortocoronary bypass graft: Secondary | ICD-10-CM | POA: Diagnosis not present

## 2017-10-12 NOTE — Progress Notes (Signed)
Daily Session Note  Patient Details  Name: LYRIKA SOUDERS MRN: [id number] Date of Birth: 1945/07/09 Referring Provider:     CARDIAC REHAB PHASE II ORIENTATION from 09/05/2017 in [place]  Referring Provider  Dr. Harl Bowie      Encounter Date: 10/12/2017  Check In: Session Check In - 10/12/17 0815      Check-In   Location  [facility]    Staff Present  Aundra Dubin, RN, BSN;Gregory Luther Parody, BS, EP, Exercise Physiologist    Supervising physician immediately available to respond to emergencies  See telemetry face sheet for immediately available MD    Medication changes reported      No    Fall or balance concerns reported     No    Warm-up and Cool-down  Performed as group-led instruction    Resistance Training Performed  Yes    VAD Patient?  No      Pain Assessment   Currently in Pain?  No/denies    Pain Score  0-No pain    Multiple Pain Sites  No       Capillary Blood Glucose: No results found for this or any previous visit (from the past 24 hour(s)).    Social History   Tobacco Use  Smoking Status Never Smoker  Smokeless Tobacco Former Systems developer  . Types: Snuff    Goals Met:  Independence with exercise equipment Exercise tolerated well No report of cardiac concerns or symptoms Strength training completed today  Goals Unmet:  Not Applicable  Comments: Check out 915.   Dr. Kate Sable is Medical Director for [facility] Cardiac and Pulmonary Rehab.

## 2017-10-14 ENCOUNTER — Encounter (HOSPITAL_COMMUNITY)
Admission: RE | Admit: 2017-10-14 | Discharge: 2017-10-14 | Disposition: A | Discharge status: Home / Self Care | Payer: Medicare HMO | Source: Ambulatory Visit | Attending: Cardiology | Admitting: Cardiology

## 2017-10-14 DIAGNOSIS — Z951 Presence of aortocoronary bypass graft: Secondary | ICD-10-CM | POA: Diagnosis not present

## 2017-10-14 NOTE — Progress Notes (Signed)
Daily Session Note  Patient Details  Name: KARELLY DEWALT MRN: [id number] Date of Birth: 1946/04/16 Referring Provider:     CARDIAC REHAB PHASE II ORIENTATION from 09/05/2017 in [place]  Referring Provider  Dr. Harl Bowie      Encounter Date: 10/14/2017  Check In: Session Check In - 10/14/17 0815      Check-In   Location  [facility]    Staff Present  Russella Dar, MS, EP, [facility], Exercise Physiologist;Gregory Luther Parody, BS, EP, Exercise Physiologist    Supervising physician immediately available to respond to emergencies  See telemetry face sheet for immediately available MD    Medication changes reported      No    Fall or balance concerns reported     No    Warm-up and Cool-down  Performed as group-led instruction    Resistance Training Performed  Yes    VAD Patient?  No      Pain Assessment   Currently in Pain?  No/denies    Pain Score  0-No pain    Multiple Pain Sites  No       Capillary Blood Glucose: No results found for this or any previous visit (from the past 24 hour(s)).    Social History   Tobacco Use  Smoking Status Never Smoker  Smokeless Tobacco Former Systems developer  . Types: Snuff    Goals Met:  Independence with exercise equipment Exercise tolerated well No report of cardiac concerns or symptoms Strength training completed today  Goals Unmet:  Not Applicable  Comments: Check out: 0915   Dr. Kate Sable is Medical Director for [place] and Pulmonary Rehab.

## 2017-10-17 ENCOUNTER — Encounter (HOSPITAL_COMMUNITY)
Admission: RE | Admit: 2017-10-17 | Discharge: 2017-10-17 | Disposition: A | Discharge status: Home / Self Care | Payer: Medicare HMO | Source: Ambulatory Visit | Attending: Cardiology | Admitting: Cardiology

## 2017-10-17 DIAGNOSIS — Z951 Presence of aortocoronary bypass graft: Secondary | ICD-10-CM | POA: Diagnosis not present

## 2017-10-17 NOTE — Progress Notes (Signed)
Daily Session Note  Patient Details  Name: Gloria Mccoy MRN: [id number] Date of Birth: March 12, 1946 Referring Provider:     CARDIAC REHAB PHASE II ORIENTATION from 09/05/2017 in [place]  Referring Provider  Dr. Harl Bowie      Encounter Date: 10/17/2017  Check In: Session Check In - 10/17/17 0815      Check-In   Location  [facility]    Staff Present  Russella Dar, MS, EP, [facility], Exercise Physiologist;Gregory Luther Parody, BS, EP, Exercise Physiologist    Supervising physician immediately available to respond to emergencies  See telemetry face sheet for immediately available MD    Medication changes reported      No    Fall or balance concerns reported     No    Tobacco Cessation  No Change    Warm-up and Cool-down  Performed as group-led instruction    Resistance Training Performed  Yes    VAD Patient?  No      Pain Assessment   Currently in Pain?  No/denies    Pain Score  0-No pain    Multiple Pain Sites  No       Capillary Blood Glucose: No results found for this or any previous visit (from the past 24 hour(s)).    Social History   Tobacco Use  Smoking Status Never Smoker  Smokeless Tobacco Former Systems developer  . Types: Snuff    Goals Met:  Independence with exercise equipment Exercise tolerated well Personal goals reviewed No report of cardiac concerns or symptoms Strength training completed today  Goals Unmet:  Not Applicable  Comments: Chdck out: 0915   Dr. Kate Sable is Medical Director for [place] and Pulmonary Rehab.

## 2017-10-19 ENCOUNTER — Encounter (HOSPITAL_COMMUNITY)
Admission: RE | Admit: 2017-10-19 | Discharge: 2017-10-19 | Disposition: A | Discharge status: Home / Self Care | Payer: Medicare HMO | Source: Ambulatory Visit | Attending: Cardiology | Admitting: Cardiology

## 2017-10-19 DIAGNOSIS — Z951 Presence of aortocoronary bypass graft: Secondary | ICD-10-CM | POA: Insufficient documentation

## 2017-10-19 NOTE — Progress Notes (Signed)
Cardiac Individual Treatment Plan  Patient Details  Name: Gloria Mccoy MRN: [id number] Date of Birth: [birth year] Referring Provider:     CARDIAC REHAB PHASE II ORIENTATION from 09/05/2017 in [place]  Referring Provider  Dr. Harl Bowie      Initial Encounter Date:    CARDIAC REHAB PHASE II ORIENTATION from 09/05/2017 in [place]  Date  09/05/17  Referring Provider  Dr. Harl Bowie      Visit Diagnosis: S/P CABG x 3  Patient's Home Medications on Admission:  Current Outpatient Medications:  .  acetaminophen (TYLENOL) 325 MG tablet, Take 2 tablets (650 mg total) by mouth every 6 (six) hours as needed for mild pain., Disp: , Rfl:  .  aspirin EC 325 MG EC tablet, Take 1 tablet (325 mg total) by mouth daily., Disp: 30 tablet, Rfl: 0 .  atorvastatin (LIPITOR) 10 MG tablet, Take 1 tablet (10 mg total) by mouth daily at 6 PM., Disp: 30 tablet, Rfl: 6 .  Cholecalciferol (VITAMIN D PO), Take 1 tablet by mouth daily. , Disp: , Rfl:  .  Cyanocobalamin (VITAMIN B-12 PO), Take 1 tablet by mouth daily., Disp: , Rfl:  .  fish oil-omega-3 fatty acids 1000 MG capsule, Take 1 g by mouth daily., Disp: , Rfl:  .  furosemide (LASIX) 40 MG tablet, Take 1 tablet (40 mg total) by mouth daily., Disp: 30 tablet, Rfl: 1 .  levothyroxine (SYNTHROID, LEVOTHROID) 75 MCG tablet, Take 75 mcg by mouth daily., Disp: , Rfl:  .  metoprolol tartrate (LOPRESSOR) 25 MG tablet, Take 1 tablet (25 mg total) by mouth 2 (two) times daily., Disp: 60 tablet, Rfl: 6 .  VITAMIN E PO, Take 1 tablet by mouth daily. , Disp: , Rfl:   Past Medical History: Past Medical History:  Diagnosis Date  . Arthritis    "hands sometimes" (07/13/2017)  . Coronary artery disease    a. s/p CABG x3 in 06/2017 with LIMA-LAD, SVG-D1, and SVG-RI.    Marland Kitchen Dips tobacco   . Essential hypertension   . Hypothyroid   . Meniere disease   . Obesity   . Osteopenia 01/2012   T score -1.3 FRAX 7.9%/0.6%     Tobacco Use: Social History   Tobacco Use  Smoking Status Never Smoker  Smokeless Tobacco Former Systems developer  . Types: Snuff    Labs: Recent Review Flowsheet Data    Labs for ITP Cardiac and Pulmonary Rehab Latest Ref Rng & Units 07/15/2017 07/16/2017 07/16/2017 07/16/2017 07/17/2017   Cholestrol 0 - 200 mg/dL - - - - -   LDLCALC 0 - 99 mg/dL - - - - -   HDL >40 mg/dL - - - - -   Trlycerides <150 mg/dL - - - - -   Hemoglobin A1c 4.8 - 5.6 % - - - - -   PHART 7.350 - 7.450 7.292(L) 7.325(L) 7.313(L) - -   PCO2ART 32.0 - 48.0 mmHg 44.4 43.2 46.5 - -   HCO3 20.0 - 28.0 mmol/L 21.1 22.2 23.3 - -   TCO2 22 - 32 mmol/L 22 23 25 25 28    ACIDBASEDEF 0.0 - 2.0 mmol/L 5.0(H) 3.0(H) 3.0(H) - -   O2SAT % 96.0 97.0 99.0 - -      Capillary Blood Glucose: Lab Results  Component Value Date   GLUCAP 124 (H) 07/17/2017   GLUCAP 135 (H) 07/17/2017   GLUCAP 144 (H) 07/17/2017   GLUCAP 120 (H) 07/17/2017   GLUCAP 99 07/17/2017  Exercise Target Goals:    Exercise Program Goal: Individual exercise prescription set using results from initial 6 min walk test and THRR while considering  patient's activity barriers and safety.   Exercise Prescription Goal: Starting with aerobic activity 30 plus minutes a day, 3 days per week for initial exercise prescription. Provide home exercise prescription and guidelines that participant acknowledges understanding prior to discharge.  Activity Barriers & Risk Stratification: Activity Barriers & Cardiac Risk Stratification - 09/05/17 1018      Activity Barriers & Cardiac Risk Stratification   Activity Barriers  Deconditioning    Cardiac Risk Stratification  High       6 Minute Walk: 6 Minute Walk    Row Name 09/05/17 1018         6 Minute Walk   Phase  Initial     Distance  1050 feet     Distance % Change  0 %     Distance Feet Change  0 ft     Walk Time  6 minutes     # of Rest Breaks  0     MPH  1.98     METS  2.51     RPE  9      Perceived Dyspnea   9     VO2 Peak  7.06     Symptoms  No     Resting HR  73 bpm     Resting BP  110/70     Resting Oxygen Saturation   94 %     Exercise Oxygen Saturation  during 6 min walk  88 %     Max Ex. HR  107 bpm     Max Ex. BP  140/72     2 Minute Post BP  116/72        Oxygen Initial Assessment:   Oxygen Re-Evaluation:   Oxygen Discharge (Final Oxygen Re-Evaluation):   Initial Exercise Prescription: Initial Exercise Prescription - 09/05/17 1000      Date of Initial Exercise RX and Referring Provider   Date  09/05/17    Referring Provider  Dr. Harl Bowie      Treadmill   MPH  1.3    Grade  0    Minutes  15    METs  1.9      NuStep   Level  2    SPM  55    Minutes  20    METs  1.8      Prescription Details   Frequency (times per week)  3    Duration  Progress to 30 minutes of continuous aerobic without signs/symptoms of physical distress      Intensity   THRR 40-80% of Max Heartrate  [phone]    Ratings of Perceived Exertion  11-13    Perceived Dyspnea  0-4      Progression   Progression  Continue progressive overload as per policy without signs/symptoms or physical distress.      Resistance Training   Training Prescription  Yes    Weight  1    Reps  10-15       Perform Capillary Blood Glucose checks as needed.  Exercise Prescription Changes:  Exercise Prescription Changes    Row Name 09/15/17 1500 10/10/17 1400           Response to Exercise   Blood Pressure (Admit)  146/70  130/68      Blood Pressure (Exercise)  130/70  140/80  Blood Pressure (Exit)  138/60  120/56      Heart Rate (Admit)  70 bpm  101 bpm      Heart Rate (Exercise)  65 bpm  107 bpm      Heart Rate (Exit)  79 bpm  73 bpm      Rating of Perceived Exertion (Exercise)  10  10      Duration  Continue with 30 min of aerobic exercise without signs/symptoms of physical distress.  Continue with 30 min of aerobic exercise without signs/symptoms of physical distress.       Intensity  THRR New 101-117-132  THRR New 120-129-139        Progression   Progression  Continue to progress workloads to maintain intensity without signs/symptoms of physical distress.  Continue to progress workloads to maintain intensity without signs/symptoms of physical distress.        Resistance Training   Training Prescription  Yes  Yes      Weight  1  1      Reps  10-15  10-15        Treadmill   MPH  1.5  1.8      Grade  0  0      Minutes  15  15      METs  2.1  2.3        NuStep   Level  2  2      SPM  100  93      Minutes  20  20      METs  2.1  1.8         Exercise Comments:  Exercise Comments    Row Name 09/15/17 1538 10/10/17 1443         Exercise Comments  Patient is doing very well in CR and has only just started but has increased her speed on the treadmill and her SPMs on the nustep substantially. patient will continued to be monitored over the course of the program by myself and the staff   Patient continues to do very well in CR. Patient has increased her speed on the treadmill to 1.8. Progression on treadmill mahy be hindered due to patients lack of knowledge on how to operate the machine independantly. Patient has also maintained her level on the nustep as well as her watts. Patient will continued to be monitored throughout the remainder of the program.          Exercise Goals and Review:  Exercise Goals    Row Name 09/05/17 1019             Exercise Goals   Increase Physical Activity  Yes       Intervention  Provide advice, education, support and counseling about physical activity/exercise needs.;Develop an individualized exercise prescription for aerobic and resistive training based on initial evaluation findings, risk stratification, comorbidities and participant's personal goals.       Expected Outcomes  Short Term: Attend rehab on a regular basis to increase amount of physical activity.       Increase Strength and Stamina  Yes        Intervention  Develop an individualized exercise prescription for aerobic and resistive training based on initial evaluation findings, risk stratification, comorbidities and participant's personal goals.;Provide advice, education, support and counseling about physical activity/exercise needs.       Expected Outcomes  Short Term: Increase workloads from initial exercise prescription for resistance, speed, and METs.  Able to understand and use rate of perceived exertion (RPE) scale  Yes       Intervention  Provide education and explanation on how to use RPE scale       Expected Outcomes  Short Term: Able to use RPE daily in rehab to express subjective intensity level;Long Term:  Able to use RPE to guide intensity level when exercising independently       Able to understand and use Dyspnea scale  Yes       Intervention  Provide education and explanation on how to use Dyspnea scale       Expected Outcomes  Short Term: Able to use Dyspnea scale daily in rehab to express subjective sense of shortness of breath during exertion;Long Term: Able to use Dyspnea scale to guide intensity level when exercising independently       Knowledge and understanding of Target Heart Rate Range (THRR)  Yes       Intervention  Provide education and explanation of THRR including how the numbers were predicted and where they are located for reference       Expected Outcomes  Short Term: Able to state/look up THRR;Long Term: Able to use THRR to govern intensity when exercising independently;Short Term: Able to use daily as guideline for intensity in rehab       Able to check pulse independently  Yes       Intervention  Provide education and demonstration on how to check pulse in carotid and radial arteries.;Review the importance of being able to check your own pulse for safety during independent exercise       Expected Outcomes  Short Term: Able to explain why pulse checking is important during independent exercise;Long Term:  Able to check pulse independently and accurately       Understanding of Exercise Prescription  Yes       Intervention  Provide education, explanation, and written materials on patient's individual exercise prescription       Expected Outcomes  Short Term: Able to explain program exercise prescription;Long Term: Able to explain home exercise prescription to exercise independently          Exercise Goals Re-Evaluation : Exercise Goals Re-Evaluation    Row Name 09/15/17 1537 10/10/17 1441           Exercise Goal Re-Evaluation   Exercise Goals Review  Increase Physical Activity;Increase Strength and Stamina;Able to understand and use rate of perceived exertion (RPE) scale;Able to check pulse independently;Knowledge and understanding of Target Heart Rate Range (THRR);Able to understand and use Dyspnea scale;Understanding of Exercise Prescription  Increase Physical Activity;Increase Strength and Stamina;Able to understand and use rate of perceived exertion (RPE) scale;Able to check pulse independently;Knowledge and understanding of Target Heart Rate Range (THRR);Able to understand and use Dyspnea scale;Understanding of Exercise Prescription      Comments  Patient is doing very well in CR and has only just started but has increased her speed on the treadmill and her SPMs on the nustep substantially. patient will continued to be monitored over the course of the program by myself and the staff   Patient continues to do very well in CR. Patient has increased her speed on the treadmill to 1.8. Progression on treadmill mahy be hindered due to patients lack of knowledge on how to operate the machine independantly. Patient has also maintained her level on the nustep as well as her watts. Patient will continued to be monitored throughout the remainder of the program.  Expected Outcomes  Patient wishes to get strength back and to lose weight  Patient wishes to get strength back and to lose weight           Discharge Exercise Prescription (Final Exercise Prescription Changes): Exercise Prescription Changes - 10/10/17 1400      Response to Exercise   Blood Pressure (Admit)  130/68    Blood Pressure (Exercise)  140/80    Blood Pressure (Exit)  120/56    Heart Rate (Admit)  101 bpm    Heart Rate (Exercise)  107 bpm    Heart Rate (Exit)  73 bpm    Rating of Perceived Exertion (Exercise)  10    Duration  Continue with 30 min of aerobic exercise without signs/symptoms of physical distress.    Intensity  THRR New 120-129-139      Progression   Progression  Continue to progress workloads to maintain intensity without signs/symptoms of physical distress.      Resistance Training   Training Prescription  Yes    Weight  1    Reps  10-15      Treadmill   MPH  1.8    Grade  0    Minutes  15    METs  2.3      NuStep   Level  2    SPM  93    Minutes  20    METs  1.8       Nutrition:  Target Goals: Understanding of nutrition guidelines, daily intake of sodium 1500mg , cholesterol 200mg , calories 30% from fat and 7% or less from saturated fats, daily to have 5 or more servings of fruits and vegetables.  Biometrics: Pre Biometrics - 09/05/17 1019      Pre Biometrics   Height  5\' 2"  (1.575 m)    Weight  192 lb 3.2 oz (87.2 kg)    Waist Circumference  40 inches    Hip Circumference  41 inches    Waist to Hip Ratio  0.98 %    BMI (Calculated)  35.14    Triceps Skinfold  20 mm    % Body Fat  43.6 %    Grip Strength  49.4 kg    Flexibility  11.33 in    Single Leg Stand  3 seconds        Nutrition Therapy Plan and Nutrition Goals: Nutrition Therapy & Goals - 10/19/17 1407      Nutrition Therapy   RD appointment deferred  Yes      Personal Nutrition Goals   Personal Goal #2  Patient is completley changed her diet. Eating heart healthy    Additional Goals?  No    Comments  Patient continues to say she is eating heart healthy.  Will continue to monitor.         Nutrition Assessments: Nutrition Assessments - 09/05/17 1116      MEDFICTS Scores   Pre Score  9       Nutrition Goals Re-Evaluation:   Nutrition Goals Discharge (Final Nutrition Goals Re-Evaluation):   Psychosocial: Target Goals: Acknowledge presence or absence of significant depression and/or stress, maximize coping skills, provide positive support system. Participant is able to verbalize types and ability to use techniques and skills needed for reducing stress and depression.  Initial Review & Psychosocial Screening: Initial Psych Review & Screening - 09/05/17 1120      Initial Review   Current issues with  Current Sleep Concerns      Family  Dynamics   Good Support System?  Yes      Barriers   Psychosocial barriers to participate in program  Psychosocial barriers identified (see note)      Screening Interventions   Interventions  Encouraged to exercise       Quality of Life Scores: Quality of Life - 09/05/17 1021      Quality of Life Scores   Health/Function Pre  19.97 %    Socioeconomic Pre  17.5 %    Psych/Spiritual Pre  20 %    Family Pre  17.8 %    GLOBAL Pre  19.15 %      Scores of 19 and below usually indicate a poorer quality of life in these areas.  A difference of  2-3 points is a clinically meaningful difference.  A difference of 2-3 points in the total score of the Quality of Life Index has been associated with significant improvement in overall quality of life, self-image, physical symptoms, and general health in studies assessing change in quality of life.  PHQ-9: Recent Review Flowsheet Data    Depression screen [facility] 2/9 09/05/2017   Decreased Interest 0   Down, Depressed, Hopeless 0   PHQ - 2 Score 0   Altered sleeping 2   Tired, decreased energy 2   Change in appetite 0   Feeling bad or failure about yourself  0   Trouble concentrating 0   Moving slowly or fidgety/restless 0   Suicidal thoughts 0   PHQ-9 Score 4   Difficult doing  work/chores Not difficult at all     Interpretation of Total Score  Total Score Depression Severity:  1-4 = Minimal depression, 5-9 = Mild depression, 10-14 = Moderate depression, 15-19 = Moderately severe depression, 20-27 = Severe depression   Psychosocial Evaluation and Intervention: Psychosocial Evaluation - 09/05/17 1121      Psychosocial Evaluation & Interventions   Interventions  Encouraged to exercise with the program and follow exercise prescription    Continue Psychosocial Services   Follow up required by staff       Psychosocial Re-Evaluation: Psychosocial Re-Evaluation    Dent Name 09/22/17 0943 10/19/17 1409           Psychosocial Re-Evaluation   Current issues with  Current Sleep Concerns  Current Sleep Concerns      Comments  Patient's initial QOL was 19.15 and her PHQ-9 was 4 with current sleep issues.   Patient's initial QOL was 19.15 and her PHQ-9 was 4 with current sleep issues.       Expected Outcomes  Patient will have no additional psychosocial issues identified at discharge and her sleep issues will be managed.   Patient will have no additional psychosocial issues identified at discharge and her sleep issues will be managed.       Interventions  Stress management education;Encouraged to attend Cardiac Rehabilitation for the exercise;Relaxation education  Stress management education;Encouraged to attend Cardiac Rehabilitation for the exercise;Relaxation education      Continue Psychosocial Services   No Follow up required  No Follow up required         Psychosocial Discharge (Final Psychosocial Re-Evaluation): Psychosocial Re-Evaluation - 10/19/17 1409      Psychosocial Re-Evaluation   Current issues with  Current Sleep Concerns    Comments  Patient's initial QOL was 19.15 and her PHQ-9 was 4 with current sleep issues.     Expected Outcomes  Patient will have no additional psychosocial issues identified at discharge and her  sleep issues will be managed.      Interventions  Stress management education;Encouraged to attend Cardiac Rehabilitation for the exercise;Relaxation education    Continue Psychosocial Services   No Follow up required       Vocational Rehabilitation: Provide vocational rehab assistance to qualifying candidates.   Vocational Rehab Evaluation & Intervention: Vocational Rehab - 09/05/17 1113      Initial Vocational Rehab Evaluation & Intervention   Assessment shows need for Vocational Rehabilitation  No       Education: Education Goals: Education classes will be provided on a weekly basis, covering required topics. Participant will state understanding/return demonstration of topics presented.  Learning Barriers/Preferences: Learning Barriers/Preferences - 09/05/17 1056      Learning Barriers/Preferences   Learning Barriers  Hearing    Learning Preferences  Written Material;Skilled Demonstration;Pictoral;Group Instruction       Education Topics: Hypertension, Hypertension Reduction -Define heart disease and high blood pressure. Discus how high blood pressure affects the body and ways to reduce high blood pressure.   Exercise and Your Heart -Discuss why it is important to exercise, the FITT principles of exercise, normal and abnormal responses to exercise, and how to exercise safely.   Angina -Discuss definition of angina, causes of angina, treatment of angina, and how to decrease risk of having angina.   Cardiac Medications -Review what the following cardiac medications are used for, how they affect the body, and side effects that may occur when taking the medications.  Medications include Aspirin, Beta blockers, calcium channel blockers, ACE Inhibitors, angiotensin receptor blockers, diuretics, digoxin, and antihyperlipidemics.   Congestive Heart Failure -Discuss the definition of CHF, how to live with CHF, the signs and symptoms of CHF, and how keep track of weight and sodium intake.   Heart Disease and  Intimacy -Discus the effect sexual activity has on the heart, how changes occur during intimacy as we age, and safety during sexual activity.   Smoking Cessation / COPD -Discuss different methods to quit smoking, the health benefits of quitting smoking, and the definition of COPD.   CARDIAC REHAB PHASE II EXERCISE from 10/12/2017 in [facility]  Date  09/08/17  Educator  DC  Instruction Review Code  2- Demonstrated Understanding      Nutrition I: Fats -Discuss the types of cholesterol, what cholesterol does to the heart, and how cholesterol levels can be controlled.   CARDIAC REHAB PHASE II EXERCISE from 10/12/2017 in [place]  Date  09/14/17  Educator  DC  Instruction Review Code  2- Demonstrated Understanding      Nutrition II: Labels -Discuss the different components of food labels and how to read food label   CARDIAC REHAB PHASE II EXERCISE from 10/12/2017 in [place]  Date  09/21/17  Educator  DJ  Instruction Review Code  2- Demonstrated Understanding      Heart Parts/Heart Disease and PAD -Discuss the anatomy of the heart, the pathway of blood circulation through the heart, and these are affected by heart disease.   CARDIAC REHAB PHASE II EXERCISE from 10/12/2017 in [place]  Date  09/28/17  Educator  DJ  Instruction Review Code  2- Demonstrated Understanding      Stress I: Signs and Symptoms -Discuss the causes of stress, how stress may lead to anxiety and depression, and ways to limit stress.   CARDIAC REHAB PHASE II EXERCISE from 10/12/2017 in [place]  Date  10/05/17  Educator  GC  Instruction Review Code  2- Demonstrated Understanding      Stress II: Relaxation -Discuss different types of relaxation techniques to limit stress.   CARDIAC REHAB PHASE II EXERCISE from 10/12/2017 in [place]  Date  10/12/17  Educator   DJ  Instruction Review Code  2- Demonstrated Understanding      Warning Signs of Stroke / TIA -Discuss definition of a stroke, what the signs and symptoms are of a stroke, and how to identify when someone is having stroke.   Knowledge Questionnaire Score: Knowledge Questionnaire Score - 09/05/17 1111      Knowledge Questionnaire Score   Pre Score  20/24       Core Components/Risk Factors/Patient Goals at Admission: Personal Goals and Risk Factors at Admission - 09/05/17 1116      Core Components/Risk Factors/Patient Goals on Admission    Weight Management  Yes    Intervention  Weight Management/Obesity: Establish reasonable short term and long term weight goals.;Obesity: Provide education and appropriate resources to help participant work on and attain dietary goals.    Admit Weight  192 lb 3.2 oz (87.2 kg)    Goal Weight: Short Term  183 lb 6.4 oz (83.2 kg)    Goal Weight: Long Term  174 lb 11.2 oz (79.2 kg)    Personal Goal Other  Yes    Personal Goal  Gain strength, Lose 17 pounds, get physically active again.     Intervention  Attend CR 3 x week and supplement with exercise at home 2 x week.     Expected Outcomes  To reach personal goals.        Core Components/Risk Factors/Patient Goals Review:  Goals and Risk Factor Review    Row Name 09/22/17 0940 10/19/17 1407           Core Components/Risk Factors/Patient Goals Review   Personal Goals Review  Weight Management/Obesity Get strength back; lose 17 lbs; get physically active.  Weight Management/Obesity Get strenght back; lose 17 lbs; get physically active.       Review  Patient has completed 8 sessions maintaining her weight. She is doing well in the program with some progression. She says it is too soon to tell if the program is making a difference. She hope to meet her goals as the continues the program. Will continue to monitor for progress.   Patient has completed 20 sessions losing 1.5 lbs since she started the  program. She continues to do well in the program with progression. She says she does feel stronger and has more energy but is not where she wants to be. She hopes she will improve more as she continues to attend sessions. Will continue to monitor for progress.       Expected Outcomes  Patient will continue to attend sessions and complete the program.   Patient will continue to attend sessions and complete the program.          Core Components/Risk Factors/Patient Goals at Discharge (Final Review):  Goals and Risk Factor Review - 10/19/17 1407      Core Components/Risk Factors/Patient Goals Review   Personal Goals Review  Weight Management/Obesity Get strenght back; lose 17 lbs; get physically active.     Review  Patient has completed 20 sessions losing 1.5 lbs since she started the program. She continues to do well in the program with progression. She says she does feel stronger and has more energy but is not where she wants  to be. She hopes she will improve more as she continues to attend sessions. Will continue to monitor for progress.     Expected Outcomes  Patient will continue to attend sessions and complete the program.        ITP Comments: ITP Comments    Row Name 09/05/17 1114           ITP Comments  Mrs. Lorence is a pleasant 74 yearld female. She has just had CABGx3. She is feeling great. Still a little sore where the incision site is. Other than that she is doing well and is eager to get started.           Comments: ITP 30 Day REVIEW Patient doing well in the program. Will continue to monitor for progress.

## 2017-10-19 NOTE — Progress Notes (Signed)
Daily Session Note  Patient Details  Name: Gloria Mccoy MRN: [id number] Date of Birth: 04-06-1946 Referring Provider:     CARDIAC REHAB PHASE II ORIENTATION from 09/05/2017 in [name]  Referring Provider  Dr. Harl Bowie      Encounter Date: 10/19/2017  Check In: Session Check In - 10/19/17 0840      Check-In   Location  [facility]    Staff Present  Diane Angelina Pih, MS, EP, [facility], Exercise Physiologist;Kristin Lamagna Luther Parody, BS, EP, Exercise Physiologist;Debra Wynetta Emery, RN, BSN    Supervising physician immediately available to respond to emergencies  See telemetry face sheet for immediately available MD    Medication changes reported      No    Fall or balance concerns reported     No    Warm-up and Cool-down  Performed as group-led instruction    Resistance Training Performed  Yes    VAD Patient?  No      Pain Assessment   Currently in Pain?  No/denies    Pain Score  0-No pain    Multiple Pain Sites  No       Capillary Blood Glucose: No results found for this or any previous visit (from the past 24 hour(s)).    Social History   Tobacco Use  Smoking Status Never Smoker  Smokeless Tobacco Former Systems developer  . Types: Snuff    Goals Met:  Independence with exercise equipment Exercise tolerated well  Goals Unmet:  Not Applicable  Comments: Check out 915   Dr. Kate Sable is Medical Director for [place] and Pulmonary Rehab.

## 2017-10-21 ENCOUNTER — Encounter (HOSPITAL_COMMUNITY)
Admission: RE | Admit: 2017-10-21 | Discharge: 2017-10-21 | Disposition: A | Discharge status: Home / Self Care | Payer: Medicare HMO | Source: Ambulatory Visit | Attending: Cardiology | Admitting: Cardiology

## 2017-10-21 DIAGNOSIS — Z951 Presence of aortocoronary bypass graft: Secondary | ICD-10-CM | POA: Diagnosis not present

## 2017-10-21 NOTE — Progress Notes (Signed)
Daily Session Note  Patient Details  Name: Gloria Mccoy MRN: [id number] Date of Birth: 1946/06/05 Referring Provider:     CARDIAC REHAB PHASE II ORIENTATION from 09/05/2017 in [place]  Referring Provider  Dr. Harl Bowie      Encounter Date: 10/21/2017  Check In: Session Check In - 10/21/17 0815      Check-In   Location  [facility]    Staff Present  Russella Dar, MS, EP, [facility], Exercise Physiologist;Gregory Luther Parody, BS, EP, Exercise Physiologist    Supervising physician immediately available to respond to emergencies  See telemetry face sheet for immediately available MD    Medication changes reported      No    Fall or balance concerns reported     No    Tobacco Cessation  No Change    Warm-up and Cool-down  Performed as group-led instruction    Resistance Training Performed  Yes    VAD Patient?  No      Pain Assessment   Currently in Pain?  No/denies    Pain Score  0-No pain    Multiple Pain Sites  No       Capillary Blood Glucose: No results found for this or any previous visit (from the past 24 hour(s)).    Social History   Tobacco Use  Smoking Status Never Smoker  Smokeless Tobacco Former Systems developer  . Types: Snuff    Goals Met:  Independence with exercise equipment Exercise tolerated well No report of cardiac concerns or symptoms Strength training completed today  Goals Unmet:  Not Applicable  Comments: Check out: 0915   Dr. Kate Sable is Medical Director for [place] and Pulmonary Rehab.

## 2017-10-24 ENCOUNTER — Encounter (HOSPITAL_COMMUNITY)
Admission: RE | Admit: 2017-10-24 | Discharge: 2017-10-24 | Disposition: A | Discharge status: Home / Self Care | Payer: Medicare HMO | Source: Ambulatory Visit | Attending: Cardiology | Admitting: Cardiology

## 2017-10-24 DIAGNOSIS — Z951 Presence of aortocoronary bypass graft: Secondary | ICD-10-CM | POA: Diagnosis not present

## 2017-10-24 NOTE — Progress Notes (Signed)
Daily Session Note  Patient Details  Name: Gloria Mccoy MRN: [id number] Date of Birth: 1946/02/09 Referring Provider:     CARDIAC REHAB PHASE II ORIENTATION from 09/05/2017 in [place]  Referring Provider  Dr. Harl Bowie      Encounter Date: 10/24/2017  Check In: Session Check In - 10/24/17 0815      Check-In   Location  [facility]    Staff Present  Diane Angelina Pih, MS, EP, [facility], Exercise Physiologist;Gregory Luther Parody, BS, EP, Exercise Physiologist;Shannen Vernon Wynetta Emery, RN, BSN    Supervising physician immediately available to respond to emergencies  See telemetry face sheet for immediately available MD    Medication changes reported      No    Fall or balance concerns reported     No    Warm-up and Cool-down  Performed as group-led instruction    Resistance Training Performed  Yes    VAD Patient?  No      Pain Assessment   Currently in Pain?  No/denies    Pain Score  0-No pain    Multiple Pain Sites  No       Capillary Blood Glucose: No results found for this or any previous visit (from the past 24 hour(s)).    Social History   Tobacco Use  Smoking Status Never Smoker  Smokeless Tobacco Former Systems developer  . Types: Snuff    Goals Met:  Independence with exercise equipment Exercise tolerated well No report of cardiac concerns or symptoms Strength training completed today  Goals Unmet:  Not Applicable  Comments: Check out 915.   Dr. Kate Sable is Medical Director for [facility] and Pulmonary Rehab.

## 2017-10-26 ENCOUNTER — Encounter (HOSPITAL_COMMUNITY)
Admission: RE | Admit: 2017-10-26 | Discharge: 2017-10-26 | Disposition: A | Discharge status: Home / Self Care | Payer: Medicare HMO | Source: Ambulatory Visit | Attending: Cardiology | Admitting: Cardiology

## 2017-10-26 DIAGNOSIS — Z951 Presence of aortocoronary bypass graft: Secondary | ICD-10-CM | POA: Diagnosis not present

## 2017-10-26 NOTE — Progress Notes (Signed)
Daily Session Note  Patient Details  Name: AMANDAMARIE FEGGINS MRN: [id number] Date of Birth: 1946-02-10 Referring Provider:     CARDIAC REHAB PHASE II ORIENTATION from 09/05/2017 in [place]  Referring Provider  Dr. Harl Bowie      Encounter Date: 10/26/2017  Check In: Session Check In - 10/26/17 0843      Check-In   Location  [facility]    Staff Present  Russella Dar, MS, EP, [facility], Exercise Physiologist;Gregory Luther Parody, BS, EP, Exercise Physiologist    Supervising physician immediately available to respond to emergencies  See telemetry face sheet for immediately available MD    Medication changes reported      No    Fall or balance concerns reported     No    Tobacco Cessation  No Change    Warm-up and Cool-down  Performed as group-led instruction    Resistance Training Performed  Yes    VAD Patient?  No      Pain Assessment   Currently in Pain?  No/denies    Pain Score  0-No pain    Multiple Pain Sites  No       Capillary Blood Glucose: No results found for this or any previous visit (from the past 24 hour(s)).    Social History   Tobacco Use  Smoking Status Never Smoker  Smokeless Tobacco Former Systems developer  . Types: Snuff    Goals Met:  Independence with exercise equipment Exercise tolerated well No report of cardiac concerns or symptoms Strength training completed today  Goals Unmet:  Not Applicable  Comments: Check out: 0915   Dr. Kate Sable is Medical Director for [place] and Pulmonary Rehab.

## 2017-10-28 ENCOUNTER — Encounter (HOSPITAL_COMMUNITY)
Admission: RE | Admit: 2017-10-28 | Discharge: 2017-10-28 | Disposition: A | Discharge status: Home / Self Care | Payer: Medicare HMO | Source: Ambulatory Visit | Attending: Cardiology | Admitting: Cardiology

## 2017-10-28 ENCOUNTER — Other Ambulatory Visit (HOSPITAL_COMMUNITY)
Admission: RE | Admit: 2017-10-28 | Discharge: 2017-10-28 | Disposition: A | Discharge status: Home / Self Care | Payer: Medicare HMO | Source: Ambulatory Visit | Attending: Student | Admitting: Student

## 2017-10-28 DIAGNOSIS — Z951 Presence of aortocoronary bypass graft: Secondary | ICD-10-CM | POA: Insufficient documentation

## 2017-10-28 DIAGNOSIS — I2510 Atherosclerotic heart disease of native coronary artery without angina pectoris: Secondary | ICD-10-CM | POA: Insufficient documentation

## 2017-10-28 DIAGNOSIS — Z79899 Other long term (current) drug therapy: Secondary | ICD-10-CM | POA: Insufficient documentation

## 2017-10-28 LAB — LIPID PANEL
CHOL/HDL RATIO: 2.9 ratio
CHOLESTEROL: 96 mg/dL (ref 0–200)
HDL: 33 mg/dL — ABNORMAL LOW (ref >40)
LDL CALC: 56 mg/dL (ref 0–99)
Triglycerides: 36 mg/dL (ref <150)
VLDL: 7 mg/dL (ref 0–40)

## 2017-10-28 LAB — HEPATIC FUNCTION PANEL
ALBUMIN: 3.8 g/dL (ref 3.5–5.0)
ALK PHOS: 63 U/L (ref 38–126)
ALT: 14 U/L (ref 14–54)
AST: 19 U/L (ref 15–41)
Bilirubin, Direct: 0.1 mg/dL (ref 0.1–0.5)
Indirect Bilirubin: 0.8 mg/dL (ref 0.3–0.9)
Total Bilirubin: 0.9 mg/dL (ref 0.3–1.2)
Total Protein: 8.1 g/dL (ref 6.5–8.1)

## 2017-10-28 NOTE — Progress Notes (Signed)
Daily Session Note  Patient Details  Name: Gloria Mccoy MRN: [id number] Date of Birth: April 17, 1946 Referring Provider:     CARDIAC REHAB PHASE II ORIENTATION from 09/05/2017 in [place]  Referring Provider  Dr. Harl Bowie      Encounter Date: 10/28/2017  Check In: Session Check In - 10/28/17 0845      Check-In   Location  [facility]    Staff Present  Diane Angelina Pih, MS, EP, [facility], Exercise Physiologist;Kylieann Eagles Luther Parody, BS, EP, Exercise Physiologist;Debra Wynetta Emery, RN, BSN    Supervising physician immediately available to respond to emergencies  See telemetry face sheet for immediately available MD    Medication changes reported      No    Fall or balance concerns reported     No    Warm-up and Cool-down  Performed as group-led instruction    Resistance Training Performed  Yes    VAD Patient?  No      Pain Assessment   Currently in Pain?  No/denies    Pain Score  0-No pain    Multiple Pain Sites  No       Capillary Blood Glucose: No results found for this or any previous visit (from the past 24 hour(s)).    Social History   Tobacco Use  Smoking Status Never Smoker  Smokeless Tobacco Former Systems developer  . Types: Snuff    Goals Met:  Independence with exercise equipment Exercise tolerated well No report of cardiac concerns or symptoms Strength training completed today  Goals Unmet:  Not Applicable  Comments: Check out 915   Dr. Kate Sable is Medical Director for [place] and Pulmonary Rehab.

## 2017-10-31 ENCOUNTER — Ambulatory Visit: Payer: Medicare HMO | Admitting: Cardiology

## 2017-10-31 ENCOUNTER — Encounter (HOSPITAL_COMMUNITY)
Admission: RE | Admit: 2017-10-31 | Discharge: 2017-10-31 | Disposition: A | Discharge status: Home / Self Care | Payer: Medicare HMO | Source: Ambulatory Visit | Attending: Cardiology | Admitting: Cardiology

## 2017-10-31 ENCOUNTER — Encounter: Payer: Self-pay | Admitting: Cardiology

## 2017-10-31 VITALS — BP 118/60 | HR 64 | Ht 62.0 in | Wt 193.4 lb

## 2017-10-31 DIAGNOSIS — I10 Essential (primary) hypertension: Secondary | ICD-10-CM | POA: Diagnosis not present

## 2017-10-31 DIAGNOSIS — Z79899 Other long term (current) drug therapy: Secondary | ICD-10-CM

## 2017-10-31 DIAGNOSIS — E782 Mixed hyperlipidemia: Secondary | ICD-10-CM

## 2017-10-31 DIAGNOSIS — Z951 Presence of aortocoronary bypass graft: Secondary | ICD-10-CM

## 2017-10-31 DIAGNOSIS — I2510 Atherosclerotic heart disease of native coronary artery without angina pectoris: Secondary | ICD-10-CM | POA: Diagnosis not present

## 2017-10-31 MED ORDER — LISINOPRIL 2.5 MG PO TABS: 2.5000 mg | ORAL_TABLET | Freq: Every day | ORAL | 3 refills | Status: DC

## 2017-10-31 NOTE — Patient Instructions (Addendum)
Medication Instructions:  START LISINOPRIL 2.5 MG DAILY   DECREASE ASPIRIN TO 81 MG DAILY    Labwork: 2 WEEKS  BMET  MAGNESIUM   Testing/Procedures: NONE  Follow-Up: Your physician wants you to follow-up in: 6 MONTHS .  You will receive a reminder letter in the mail two months in advance. If you don't receive a letter, please call our office to schedule the follow-up appointment.   Any Other Special Instructions Will Be Listed Below (If Applicable).     If you need a refill on your cardiac medications before your next appointment, please call your pharmacy.

## 2017-10-31 NOTE — Progress Notes (Signed)
Clinical Summary Gloria Mccoy is a 72 y.o.female seen today for follow up of the following medical problems.   1. CAD - admit Jan 2019 with unstable angina. - Jan 2019 with distal LM 80%, prox LAD 90%. She was referred for CABG/ s/p LIMA-LAD, SVG-D1, SVG-RI - Jan 2019 echo LVEF 55-60%  - no recent chest pain. Some SOB/DOE with high activities.  - compliant with meds.   2. Hyperlipidemia - 10/2017 TC 96 TG 36 HDL 33 LDL 56 - compliant with statin  3. HTN -= compliant with meds    Past Medical History:  Diagnosis Date  . Arthritis    "hands sometimes" (07/13/2017)  . Coronary artery disease    a. s/p CABG x3 in 06/2017 with LIMA-LAD, SVG-D1, and SVG-RI.    Marland Kitchen Dips tobacco   . Essential hypertension   . Hypothyroid   . Meniere disease   . Obesity   . Osteopenia 01/2012   T score -1.3 FRAX 7.9%/0.6%     Allergies  Allergen Reactions  . Sulfa Antibiotics Shortness Of Breath  . Sulfasalazine Shortness Of Breath     Current Outpatient Medications  Medication Sig Dispense Refill  . acetaminophen (TYLENOL) 325 MG tablet Take 2 tablets (650 mg total) by mouth every 6 (six) hours as needed for mild pain.    Marland Kitchen aspirin EC 325 MG EC tablet Take 1 tablet (325 mg total) by mouth daily. 30 tablet 0  . atorvastatin (LIPITOR) 10 MG tablet Take 1 tablet (10 mg total) by mouth daily at 6 PM. 30 tablet 6  . Cholecalciferol (VITAMIN D PO) Take 1 tablet by mouth daily.     . Cyanocobalamin (VITAMIN B-12 PO) Take 1 tablet by mouth daily.    . fish oil-omega-3 fatty acids 1000 MG capsule Take 1 g by mouth daily.    . furosemide (LASIX) 40 MG tablet Take 1 tablet (40 mg total) by mouth daily. 30 tablet 1  . levothyroxine (SYNTHROID, LEVOTHROID) 75 MCG tablet Take 75 mcg by mouth daily.    . metoprolol tartrate (LOPRESSOR) 25 MG tablet Take 1 tablet (25 mg total) by mouth 2 (two) times daily. 60 tablet 6  . VITAMIN E PO Take 1 tablet by mouth daily.      No current  facility-administered medications for this visit.      Past Surgical History:  Procedure Laterality Date  . CARDIAC CATHETERIZATION  07/13/2017  . CATARACT EXTRACTION W/PHACO Right 01/28/2015   Procedure: CATARACT EXTRACTION PHACO AND INTRAOCULAR LENS PLACEMENT :  CDE:  5.70;  Surgeon: Rutherford Guys, MD;  Location: [facility];  Service: Ophthalmology;  Laterality: Right;  . CATARACT EXTRACTION W/PHACO Left 02/11/2015   Procedure: CATARACT EXTRACTION PHACO AND INTRAOCULAR LENS PLACEMENT (IOC);  Surgeon: Rutherford Guys, MD;  Location: [facility];  Service: Ophthalmology;  Laterality: Left;  CDE: 7.38  . COLONOSCOPY N/A 09/26/2012   Procedure: COLONOSCOPY;  Surgeon: Jamesetta So, MD;  Location: [facility];  Service: Gastroenterology;  Laterality: N/A;  . CORONARY ARTERY BYPASS GRAFT N/A 07/15/2017   Procedure: CORONARY ARTERY BYPASS GRAFTING (CABG) X 3 USING LEFT INTERNAL MAMMARY ARTERY AND RIGHT SAPHENOUS VEIN- ENDOSCOPICALLY HARVESTED;  Surgeon: Melrose Nakayama, MD;  Location: [place];  Service: Open Heart Surgery;  Laterality: N/A;  . LEFT HEART CATH AND CORONARY ANGIOGRAPHY N/A 07/13/2017   Procedure: LEFT HEART CATH AND CORONARY ANGIOGRAPHY;  Surgeon: Martinique, Peter M, MD;  Location: [place] [facility];  Service: Cardiovascular;  Laterality:  N/A;  . TEE WITHOUT CARDIOVERSION N/A 07/15/2017   Procedure: TRANSESOPHAGEAL ECHOCARDIOGRAM (TEE);  Surgeon: Melrose Nakayama, MD;  Location: [place];  Service: Open Heart Surgery;  Laterality: N/A;  . TUBAL LIGATION    . TYMPANOPLASTY Left    fluid from ear drum     Allergies  Allergen Reactions  . Sulfa Antibiotics Shortness Of Breath  . Sulfasalazine Shortness Of Breath      Family History  Problem Relation Age of Onset  . Diabetes Sister        AODM  . Heart disease Sister 27       CABG  . Heart disease Brother 61       In his 20s  . Cancer Maternal Uncle 60       COLON  . Diabetes Sister        AODM  . Hypertension Daughter   .  Hypertension Daughter   . Breast cancer Cousin        PATERNAL COUSIN  . Heart attack Father        In his 53s     Social History Gloria Mccoy reports that she has never smoked. She has quit using smokeless tobacco. Her smokeless tobacco use included snuff. Gloria Mccoy reports that she drinks about 0.6 oz of alcohol per week.   Review of Systems CONSTITUTIONAL: No weight loss, fever, chills, weakness or fatigue.  HEENT: Eyes: No visual loss, blurred vision, double vision or yellow sclerae.No hearing loss, sneezing, congestion, runny nose or sore throat.  SKIN: No rash or itching.  CARDIOVASCULAR: per hpi RESPIRATORY: No shortness of breath, cough or sputum.  GASTROINTESTINAL: No anorexia, nausea, vomiting or diarrhea. No abdominal pain or blood.  GENITOURINARY: No burning on urination, no polyuria NEUROLOGICAL: No headache, dizziness, syncope, paralysis, ataxia, numbness or tingling in the extremities. No change in bowel or bladder control.  MUSCULOSKELETAL: No muscle, back pain, joint pain or stiffness.  LYMPHATICS: No enlarged nodes. No history of splenectomy.  PSYCHIATRIC: No history of depression or anxiety.  ENDOCRINOLOGIC: No reports of sweating, cold or heat intolerance. No polyuria or polydipsia.  Marland Kitchen   Physical Examination Vitals:   10/31/17 1414  BP: 118/60  Pulse: 64  SpO2: 97%   Vitals:   10/31/17 1414  Weight: 193 lb 6.4 oz (87.7 kg)  Height: 5\' 2"  (1.575 m)    Gen: resting comfortably, no acute distress HEENT: no scleral icterus, pupils equal round and reactive, no palptable cervical adenopathy,  CV: RRR, no m/r/g, no jvd Resp: Clear to auscultation bilaterally GI: abdomen is soft, non-tender, non-distended, normal bowel sounds, no hepatosplenomegaly MSK: extremities are warm, no edema.  Skin: warm, no rash Neuro:  no focal deficits Psych: appropriate affect   Diagnostic Studies  Jan 2019 cath  Mid LM lesion is 25% stenosed.  Dist LM to Ost LAD  lesion is 80% stenosed.  Ost Cx to Prox Cx lesion is 30% stenosed.  Ost 1st Mrg lesion is 50% stenosed.  Prox LAD lesion is 90% stenosed.  The left ventricular systolic function is normal.  LV end diastolic pressure is normal.  The left ventricular ejection fraction is 55-65% by visual estimate.   1. Single vessel obstructive CAD. Patient has complex ostial and proximal to mid LAD disease that involves the origin of the ramus intermediate and first diagonal respectively. 2. Normal LV function 3. Normal LVEDP  Plan: given complexity of lesions with ostial and bifurcation LAD disease I would recommend consideration for CABG.  Jan 2019 Carotid US Final Interpretation: Right Carotid: Velocities in the right ICA are consistent with a 1-39% stenosis.  Left Carotid: Velocities in the left ICA are consistent with a 1-39% stenosis. Vertebrals: Both vertebral arteries were patent with antegrade flow. Subclavians:  Jan 2019 echo Study Conclusions  - Left ventricle: The cavity size was normal. Wall thickness was   increased in a pattern of mild LVH. Systolic function was normal.   The estimated ejection fraction was in the range of 55% to 60%.   Wall motion was normal; there were no regional wall motion   abnormalities. Doppler parameters are consistent with abnormal   left ventricular relaxation (grade 1 diastolic dysfunction). - Mitral valve: Valve area by pressure half-time: 1.26 cm^2.  Impressions:  - Normal LV systolic function; mild LVH; mild diastolic   dysfunction.   Assessment and Plan   1. CAD - no recent symptoms - lower ASA to 81mg  daily, in setting of CAD start lisinorpril 2.5mg  daily and check BMET in 2 weeks.   2. Hyperlipidemia - at goal, continue curren tmeds   3. HTN - at goal, continue current meds      Arnoldo Lenis, M.D.

## 2017-10-31 NOTE — Progress Notes (Signed)
Daily Session Note  Patient Details  Name: ZOFIA PECKINPAUGH MRN: [id number] Date of Birth: 26-Oct-1945 Referring Provider:     CARDIAC REHAB PHASE II ORIENTATION from 09/05/2017 in [place]  Referring Provider  Dr. Harl Bowie      Encounter Date: 10/31/2017  Check In: Session Check In - 10/31/17 0815      Check-In   Location  [facility]    Staff Present  Diane Angelina Pih, MS, EP, [facility], Exercise Physiologist;Gregory Luther Parody, BS, EP, Exercise Physiologist;Asha Grumbine Wynetta Emery, RN, BSN    Supervising physician immediately available to respond to emergencies  See telemetry face sheet for immediately available MD    Medication changes reported      No    Fall or balance concerns reported     No    Warm-up and Cool-down  Performed as group-led instruction    Resistance Training Performed  Yes    VAD Patient?  No      Pain Assessment   Currently in Pain?  No/denies    Pain Score  0-No pain    Multiple Pain Sites  No       Capillary Blood Glucose: No results found for this or any previous visit (from the past 24 hour(s)).    Social History   Tobacco Use  Smoking Status Never Smoker  Smokeless Tobacco Former Systems developer  . Types: Snuff    Goals Met:  Independence with exercise equipment Exercise tolerated well No report of cardiac concerns or symptoms Strength training completed today  Goals Unmet:  Not Applicable  Comments: Check out 915.   Dr. Kate Sable is Medical Director for [facility] Cardiac and Pulmonary Rehab.

## 2017-11-01 ENCOUNTER — Telehealth: Payer: Self-pay | Admitting: *Deleted

## 2017-11-01 NOTE — Telephone Encounter (Signed)
-----   Message from Erma Heritage, [place] sent at 10/30/2017  8:24 AM EDT ----- Please let the patient know that her cholesterol levels have significantly improved as total cholesterol is down from 129 to 96 and LDL has declined from 86 to 56. At goal of LDL < 70. Liver function is within normal limits. Continue current medication regimen.

## 2017-11-01 NOTE — Telephone Encounter (Signed)
Called patient with test results. No answer. Left message to call back.  

## 2017-11-02 ENCOUNTER — Other Ambulatory Visit: Payer: Self-pay

## 2017-11-02 ENCOUNTER — Emergency Department (HOSPITAL_COMMUNITY): Payer: Medicare HMO

## 2017-11-02 ENCOUNTER — Encounter (HOSPITAL_COMMUNITY)
Admission: RE | Admit: 2017-11-02 | Discharge: 2017-11-02 | Disposition: A | Discharge status: Home / Self Care | Payer: Medicare HMO | Source: Ambulatory Visit | Attending: Cardiology | Admitting: Cardiology

## 2017-11-02 ENCOUNTER — Inpatient Hospital Stay (HOSPITAL_COMMUNITY)
Admission: EM | Admit: 2017-11-02 | Discharge: 2017-11-03 | DRG: 281 | Disposition: A | Discharge status: Home / Self Care | Payer: Medicare HMO | Attending: Cardiology | Admitting: Cardiology

## 2017-11-02 ENCOUNTER — Encounter (HOSPITAL_COMMUNITY): Payer: Self-pay

## 2017-11-02 DIAGNOSIS — Z79890 Hormone replacement therapy (postmenopausal): Secondary | ICD-10-CM

## 2017-11-02 DIAGNOSIS — H8109 Meniere's disease, unspecified ear: Secondary | ICD-10-CM | POA: Diagnosis present

## 2017-11-02 DIAGNOSIS — Z803 Family history of malignant neoplasm of breast: Secondary | ICD-10-CM

## 2017-11-02 DIAGNOSIS — J9811 Atelectasis: Secondary | ICD-10-CM | POA: Diagnosis present

## 2017-11-02 DIAGNOSIS — I25110 Atherosclerotic heart disease of native coronary artery with unstable angina pectoris: Secondary | ICD-10-CM

## 2017-11-02 DIAGNOSIS — E039 Hypothyroidism, unspecified: Secondary | ICD-10-CM | POA: Diagnosis present

## 2017-11-02 DIAGNOSIS — I200 Unstable angina: Secondary | ICD-10-CM | POA: Diagnosis present

## 2017-11-02 DIAGNOSIS — I2510 Atherosclerotic heart disease of native coronary artery without angina pectoris: Secondary | ICD-10-CM | POA: Diagnosis not present

## 2017-11-02 DIAGNOSIS — Z87891 Personal history of nicotine dependence: Secondary | ICD-10-CM | POA: Diagnosis not present

## 2017-11-02 DIAGNOSIS — Z9842 Cataract extraction status, left eye: Secondary | ICD-10-CM | POA: Diagnosis not present

## 2017-11-02 DIAGNOSIS — Z951 Presence of aortocoronary bypass graft: Secondary | ICD-10-CM | POA: Diagnosis not present

## 2017-11-02 DIAGNOSIS — E669 Obesity, unspecified: Secondary | ICD-10-CM | POA: Diagnosis present

## 2017-11-02 DIAGNOSIS — Z961 Presence of intraocular lens: Secondary | ICD-10-CM | POA: Diagnosis present

## 2017-11-02 DIAGNOSIS — Z8249 Family history of ischemic heart disease and other diseases of the circulatory system: Secondary | ICD-10-CM

## 2017-11-02 DIAGNOSIS — I10 Essential (primary) hypertension: Secondary | ICD-10-CM | POA: Diagnosis present

## 2017-11-02 DIAGNOSIS — I25710 Atherosclerosis of autologous vein coronary artery bypass graft(s) with unstable angina pectoris: Secondary | ICD-10-CM

## 2017-11-02 DIAGNOSIS — R079 Chest pain, unspecified: Secondary | ICD-10-CM | POA: Diagnosis not present

## 2017-11-02 DIAGNOSIS — I214 Non-ST elevation (NSTEMI) myocardial infarction: Secondary | ICD-10-CM | POA: Diagnosis not present

## 2017-11-02 DIAGNOSIS — Z833 Family history of diabetes mellitus: Secondary | ICD-10-CM | POA: Diagnosis not present

## 2017-11-02 DIAGNOSIS — Z6835 Body mass index (BMI) 35.0-35.9, adult: Secondary | ICD-10-CM | POA: Diagnosis not present

## 2017-11-02 DIAGNOSIS — Z882 Allergy status to sulfonamides status: Secondary | ICD-10-CM

## 2017-11-02 DIAGNOSIS — Z9841 Cataract extraction status, right eye: Secondary | ICD-10-CM

## 2017-11-02 DIAGNOSIS — Z7982 Long term (current) use of aspirin: Secondary | ICD-10-CM | POA: Diagnosis not present

## 2017-11-02 DIAGNOSIS — E785 Hyperlipidemia, unspecified: Secondary | ICD-10-CM | POA: Diagnosis present

## 2017-11-02 DIAGNOSIS — E7800 Pure hypercholesterolemia, unspecified: Secondary | ICD-10-CM | POA: Diagnosis not present

## 2017-11-02 DIAGNOSIS — Z79899 Other long term (current) drug therapy: Secondary | ICD-10-CM

## 2017-11-02 DIAGNOSIS — R0602 Shortness of breath: Secondary | ICD-10-CM | POA: Diagnosis not present

## 2017-11-02 DIAGNOSIS — R072 Precordial pain: Secondary | ICD-10-CM | POA: Diagnosis not present

## 2017-11-02 DIAGNOSIS — M8580 Other specified disorders of bone density and structure, unspecified site: Secondary | ICD-10-CM | POA: Diagnosis present

## 2017-11-02 LAB — TROPONIN I: TROPONIN I: 0.18 ng/mL — AB (ref <0.03)

## 2017-11-02 LAB — CBC
HCT: 38.2 % (ref 36.0–46.0)
Hemoglobin: 13.1 g/dL (ref 12.0–15.0)
MCH: 27.5 pg (ref 26.0–34.0)
MCHC: 34.3 g/dL (ref 30.0–36.0)
MCV: 80.1 fL (ref 78.0–100.0)
PLATELETS: 199 10*3/uL (ref 150–400)
RBC: 4.77 MIL/uL (ref 3.87–5.11)
RDW: 16.2 % — AB (ref 11.5–15.5)
WBC: 9.3 10*3/uL (ref 4.0–10.5)

## 2017-11-02 LAB — HEPATIC FUNCTION PANEL
ALK PHOS: 64 U/L (ref 38–126)
ALT: 17 U/L (ref 14–54)
AST: 38 U/L (ref 15–41)
Albumin: 3.7 g/dL (ref 3.5–5.0)
BILIRUBIN INDIRECT: 1.2 mg/dL — AB (ref 0.3–0.9)
Bilirubin, Direct: 0.6 mg/dL — ABNORMAL HIGH (ref 0.1–0.5)
TOTAL PROTEIN: 7.9 g/dL (ref 6.5–8.1)
Total Bilirubin: 1.8 mg/dL — ABNORMAL HIGH (ref 0.3–1.2)

## 2017-11-02 LAB — DIFFERENTIAL
Basophils Absolute: 0 10*3/uL (ref 0.0–0.1)
Basophils Relative: 0 %
EOS PCT: 4 %
Eosinophils Absolute: 0.4 10*3/uL (ref 0.0–0.7)
LYMPHS PCT: 34 %
Lymphs Abs: 3.2 10*3/uL (ref 0.7–4.0)
Monocytes Absolute: 1.2 10*3/uL — ABNORMAL HIGH (ref 0.1–1.0)
Monocytes Relative: 13 %
NEUTROS ABS: 4.6 10*3/uL (ref 1.7–7.7)
NEUTROS PCT: 49 %

## 2017-11-02 LAB — BASIC METABOLIC PANEL
Anion gap: 9 (ref 5–15)
BUN: 11 mg/dL (ref 6–20)
CHLORIDE: 102 mmol/L (ref 101–111)
CO2: 29 mmol/L (ref 22–32)
CREATININE: 0.85 mg/dL (ref 0.44–1.00)
Calcium: 8.9 mg/dL (ref 8.9–10.3)
GFR calc Af Amer: >60 mL/min (ref >60)
GFR calc non Af Amer: >60 mL/min (ref >60)
GLUCOSE: 110 mg/dL — AB (ref 65–99)
POTASSIUM: 4.5 mmol/L (ref 3.5–5.1)
Sodium: 140 mmol/L (ref 135–145)

## 2017-11-02 MED ORDER — SODIUM CHLORIDE 0.9 % IV SOLN: 250.0000 mL | INTRAVENOUS | Status: DC | PRN

## 2017-11-02 MED ORDER — SODIUM CHLORIDE 0.9 % WEIGHT BASED INFUSION
3.0000 mL/kg/h | INTRAVENOUS | Status: DC
  Administered 2017-11-03: 3 mL/kg/h via INTRAVENOUS

## 2017-11-02 MED ORDER — SODIUM CHLORIDE 0.9% FLUSH: 3.0000 mL | Freq: Two times a day (BID) | INTRAVENOUS | Status: DC

## 2017-11-02 MED ORDER — SODIUM CHLORIDE 0.9 % WEIGHT BASED INFUSION: 1.0000 mL/kg/h | INTRAVENOUS | Status: DC

## 2017-11-02 MED ORDER — HEPARIN (PORCINE) IN NACL 100-0.45 UNIT/ML-% IJ SOLN
900.0000 [IU]/h | INTRAMUSCULAR | Status: DC
  Administered 2017-11-02: 900 [IU]/h via INTRAVENOUS
  Filled 2017-11-02: qty 250

## 2017-11-02 MED ORDER — ASPIRIN 81 MG PO CHEW
81.0000 mg | CHEWABLE_TABLET | ORAL | Status: AC
  Administered 2017-11-03: 81 mg via ORAL
  Filled 2017-11-02: qty 1

## 2017-11-02 MED ORDER — HEPARIN BOLUS VIA INFUSION
4000.0000 [IU] | Freq: Once | INTRAVENOUS | Status: AC
  Administered 2017-11-02: 4000 [IU] via INTRAVENOUS

## 2017-11-02 MED ORDER — SODIUM CHLORIDE 0.9% FLUSH: 3.0000 mL | INTRAVENOUS | Status: DC | PRN

## 2017-11-02 NOTE — H&P (View-Only) (Signed)
Cardiology Consultation:   Patient ID: Gloria Mccoy; [id number]; 15-May-1946   Admit date: 11/02/2017 Date of Consult: 11/02/2017  Primary Care Provider: Redmond School, MD Primary Cardiologist: Carlyle Dolly, MD   Primary Electrophysiologist: N/A   Patient Profile:   Gloria Mccoy is a 72 y.o. female with a hx of CAD status post CABG times three 06/2017 who is being seen today for the evaluation of chest pain while at cardiac rehab at the request of Dr. Gerarda Fraction.  History of Present Illness:   Gloria Mccoy is a 72 year old patient of Dr. Harl Bowie who underwent CABG times three 06/2017 with LIMA to the LAD, SVG to the diagonal 1 and SVG to RI.  2D echo 06/2017 LVEF 55 to 60%.  Also has hypertension hyperlipidemia, obesity, dips tobacco(quit).  Just saw Dr. Harl Bowie 2 days ago and was doing well.  Had some dyspnea on exertion with high activities but no chest pain.  Patient was sent to the ED by cardiac rehab today when she had chest pain during rehab.  Initial troponin 0 0.03 but second troponin 0 0.18, creatinine normal direct bili slightly elevated at 0.6 total bili 1.8.  Patient is in the room comfortable with her husband and daughter.  She said while walking on treadmill today she developed chest tightness similar to her prior angina.  It went away within 10 minutes of stopping exercise.  No nitroglycerin given.  She is pain-free and comfortable right now.  She has had no angina since her bypass.  She just saw Dr. Harl Bowie 2 days ago and did cardiac rehab Monday without symptoms.  She is compliant with all medications.  Past Medical History:  Diagnosis Date  . Arthritis    "hands sometimes" (07/13/2017)  . Coronary artery disease    a. s/p CABG x3 in 06/2017 with LIMA-LAD, SVG-D1, and SVG-RI.    Marland Kitchen Dips tobacco   . Essential hypertension   . Hypothyroid   . Meniere disease   . Obesity   . Osteopenia 01/2012   T score -1.3 FRAX 7.9%/0.6%    Past Surgical History:  Procedure  Laterality Date  . CARDIAC CATHETERIZATION  07/13/2017  . CATARACT EXTRACTION W/PHACO Right 01/28/2015   Procedure: CATARACT EXTRACTION PHACO AND INTRAOCULAR LENS PLACEMENT :  CDE:  5.70;  Surgeon: Rutherford Guys, MD;  Location: [facility];  Service: Ophthalmology;  Laterality: Right;  . CATARACT EXTRACTION W/PHACO Left 02/11/2015   Procedure: CATARACT EXTRACTION PHACO AND INTRAOCULAR LENS PLACEMENT (IOC);  Surgeon: Rutherford Guys, MD;  Location: [facility];  Service: Ophthalmology;  Laterality: Left;  CDE: 7.38  . COLONOSCOPY N/A 09/26/2012   Procedure: COLONOSCOPY;  Surgeon: Jamesetta So, MD;  Location: [facility];  Service: Gastroenterology;  Laterality: N/A;  . CORONARY ARTERY BYPASS GRAFT N/A 07/15/2017   Procedure: CORONARY ARTERY BYPASS GRAFTING (CABG) X 3 USING LEFT INTERNAL MAMMARY ARTERY AND RIGHT SAPHENOUS VEIN- ENDOSCOPICALLY HARVESTED;  Surgeon: Melrose Nakayama, MD;  Location: [place];  Service: Open Heart Surgery;  Laterality: N/A;  . LEFT HEART CATH AND CORONARY ANGIOGRAPHY N/A 07/13/2017   Procedure: LEFT HEART CATH AND CORONARY ANGIOGRAPHY;  Surgeon: Martinique, Peter M, MD;  Location: [facility];  Service: Cardiovascular;  Laterality: N/A;  . TEE WITHOUT CARDIOVERSION N/A 07/15/2017   Procedure: TRANSESOPHAGEAL ECHOCARDIOGRAM (TEE);  Surgeon: Melrose Nakayama, MD;  Location: [facility];  Service: Open Heart Surgery;  Laterality: N/A;  . TUBAL LIGATION    . TYMPANOPLASTY Left    fluid from ear  drum     Home Medications:  Prior to Admission medications   Medication Sig Start Date End Date Taking? Authorizing Provider  acetaminophen (TYLENOL) 325 MG tablet Take 2 tablets (650 mg total) by mouth every 6 (six) hours as needed for mild pain. 07/20/17  Yes Nani Skillern, PA-C  aspirin EC 81 MG tablet Take 81 mg by mouth daily.   Yes [provider]  atorvastatin (LIPITOR) 10 MG tablet Take 1 tablet (10 mg total) by mouth daily at 6 PM. 09/09/17  Yes Branch, Alphonse Guild, MD    Cholecalciferol (VITAMIN D PO) Take 1 tablet by mouth daily.    Yes [provider]  Cyanocobalamin (VITAMIN B-12 PO) Take 1 tablet by mouth daily.   Yes [provider]  fish oil-omega-3 fatty acids 1000 MG capsule Take 1 g by mouth daily.   Yes [provider]  furosemide (LASIX) 40 MG tablet Take 1 tablet (40 mg total) by mouth daily. 09/06/17  Yes Melrose Nakayama, MD  levothyroxine (SYNTHROID, LEVOTHROID) 75 MCG tablet Take 75 mcg by mouth daily.   Yes [provider]  lisinopril (PRINIVIL,ZESTRIL) 2.5 MG tablet Take 1 tablet (2.5 mg total) by mouth daily. 10/31/17 01/29/18 Yes Branch, Alphonse Guild, MD  metoprolol tartrate (LOPRESSOR) 25 MG tablet Take 1 tablet (25 mg total) by mouth 2 (two) times daily. 09/09/17  Yes BranchAlphonse Guild, MD  VITAMIN E PO Take 1 tablet by mouth daily.    Yes [provider]    Inpatient Medications: Scheduled Meds:  Continuous Infusions:  PRN Meds:   Allergies:    Allergies  Allergen Reactions  . Sulfa Antibiotics Shortness Of Breath  . Sulfasalazine Shortness Of Breath    Social History:   Social History   Socioeconomic History  . Marital status: Married    Spouse name: Not on file  . Number of children: Not on file  . Years of education: Not on file  . Highest education level: Not on file  Occupational History  . Occupation: Retired  Scientific laboratory technician  . Financial resource strain: Not on file  . Food insecurity:    Worry: Not on file    Inability: Not on file  . Transportation needs:    Medical: Not on file    Non-medical: Not on file  Tobacco Use  . Smoking status: Never Smoker  . Smokeless tobacco: Former Systems developer    Types: Snuff  Substance and Sexual Activity  . Alcohol use: Yes    Alcohol/week: 0.6 oz    Types: 1 Standard drinks or equivalent per week  . Drug use: No  . Sexual activity: Yes    Birth control/protection: Surgical  Lifestyle  . Physical activity:    Days per week:  Not on file    Minutes per session: Not on file  . Stress: Not on file  Relationships  . Social connections:    Talks on phone: Not on file    Gets together: Not on file    Attends religious service: Not on file    Active member of club or organization: Not on file    Attends meetings of clubs or organizations: Not on file    Relationship status: Not on file  . Intimate partner violence:    Fear of current or ex partner: Not on file    Emotionally abused: Not on file    Physically abused: Not on file    Forced sexual activity: Not on file  Other Topics Concern  . Not on file  Social History Narrative  . Not on file    Family History:    Family History  Problem Relation Age of Onset  . Diabetes Sister        AODM  . Heart disease Sister 78       CABG  . Heart disease Brother 75       In his 29s  . Cancer Maternal Uncle 60       COLON  . Diabetes Sister        AODM  . Hypertension Daughter   . Hypertension Daughter   . Breast cancer Cousin        PATERNAL COUSIN  . Heart attack Father        In his 40s     ROS:  Please see the history of present illness.  Review of Systems  Constitution: Negative.  HENT: Negative.   Eyes: Negative.   Cardiovascular: Positive for chest pain.  Respiratory: Negative.   Hematologic/Lymphatic: Negative.   Musculoskeletal: Negative.  Negative for joint pain.  Gastrointestinal: Negative.   Genitourinary: Negative.   Neurological: Negative.     All other ROS reviewed and negative.     Physical Exam/Data:   Vitals:   11/02/17 1300 11/02/17 1315 11/02/17 1330 11/02/17 1418  BP: 132/86  (!) 139/57 136/62  Pulse:    60  Resp: 20 18 (!) 21 18  Temp:      TempSrc:      SpO2:    95%  Weight:      Height:       No intake or output data in the 24 hours ending 11/02/17 1433 Filed Weights   11/02/17 0858  Weight: 195 lb (88.5 kg)   Body mass index is 35.67 kg/m.  General: Obese, in no acute distress  Lymph: no  adenopathy Neck: no JVD Endocrine:  No thryomegaly Vascular: No carotid bruits; FA pulses 2+ bilaterally without bruits  Cardiac: Incision healing well normal S1, S2; RRR; no murmur  Lungs:  clear to auscultation bilaterally, no wheezing, rhonchi or rales  Abd: soft, nontender, no hepatomegaly  Ext: no edema Musculoskeletal:  No deformities, BUE and BLE strength normal and equal Skin: warm and dry  Neuro:  CNs 2-12 intact, no focal abnormalities noted Psych:  Normal affect   EKG:  The EKG was personally reviewed and demonstrates: Normal sinus rhythm with inferior Q waves poor R wave progression anteriorly unchanged from prior tracing Telemetry:  Telemetry was personally reviewed and demonstrates: Normal sinus rhythm  Relevant CV Studies: Jan 2019 cath  Mid LM lesion is 25% stenosed.  Dist LM to Ost LAD lesion is 80% stenosed.  Ost Cx to Prox Cx lesion is 30% stenosed.  Ost 1st Mrg lesion is 50% stenosed.  Prox LAD lesion is 90% stenosed.  The left ventricular systolic function is normal.  LV end diastolic pressure is normal.  The left ventricular ejection fraction is 55-65% by visual estimate.   1. Single vessel obstructive CAD. Patient has complex ostial and proximal to mid LAD disease that involves the origin of the ramus intermediate and first diagonal respectively. 2. Normal LV function 3. Normal LVEDP   Plan: given complexity of lesions with ostial and bifurcation LAD disease I would recommend consideration for CABG.   Jan 2019 Carotid US Final Interpretation: Right Carotid: Velocities in the right ICA are consistent with a 1-39% stenosis.  Left Carotid: Velocities in the left ICA are  consistent with a 1-39% stenosis. Vertebrals:  Both vertebral arteries were patent with antegrade flow. Subclavians:   Jan 2019 echo Study Conclusions   - Left ventricle: The cavity size was normal. Wall thickness was   increased in a pattern of mild LVH. Systolic function was  normal.   The estimated ejection fraction was in the range of 55% to 60%.   Wall motion was normal; there were no regional wall motion   abnormalities. Doppler parameters are consistent with abnormal   left ventricular relaxation (grade 1 diastolic dysfunction). - Mitral valve: Valve area by pressure half-time: 1.26 cm^2.   Impressions:   - Normal LV systolic function; mild LVH; mild diastolic   dysfunction.     Laboratory Data:  Chemistry Recent Labs  Lab 11/02/17 0934  NA 140  K 4.5  CL 102  CO2 29  GLUCOSE 110*  BUN 11  CREATININE 0.85  CALCIUM 8.9  GFRNONAA >60  GFRAA >60  ANIONGAP 9    Recent Labs  Lab 10/28/17 0927 11/02/17 0935  PROT 8.1 7.9  ALBUMIN 3.8 3.7  AST 19 38  ALT 14 17  ALKPHOS 63 64  BILITOT 0.9 1.8*   Hematology Recent Labs  Lab 11/02/17 0934  WBC 9.3  RBC 4.77  HGB 13.1  HCT 38.2  MCV 80.1  MCH 27.5  MCHC 34.3  RDW 16.2*  PLT 199   Cardiac Enzymes Recent Labs  Lab 11/02/17 0934 11/02/17 1254  TROPONINI <0.03 0.18*   No results for input(s): TROPIPOC in the last 168 hours.  BNPNo results for input(s): BNP, PROBNP in the last 168 hours.  DDimer No results for input(s): DDIMER in the last 168 hours.  Radiology/Studies:  Dg Chest 2 View  Result Date: 11/02/2017 CLINICAL DATA:  Central chest pain this morning while on the treadmill at cardiac rehab, shortness of breath, history coronary disease post three-vessel CABG in January 2019, hypertension EXAM: CHEST - 2 VIEW COMPARISON:  08/23/2017 FINDINGS: Minimal enlargement of cardiac silhouette post CABG. Mediastinal contours and pulmonary vascularity normal. Mild bibasilar atelectasis. Lungs otherwise clear. No acute infiltrate, pleural effusion or pneumothorax. Bones unremarkable. IMPRESSION: Minimal enlargement of cardiac silhouette post CABG. Bibasilar atelectasis. Electronically Signed   By: Lavonia Dana M.D.   On: 11/02/2017 09:59    Assessment and Plan:   Chest pain during  cardiac rehab with elevated troponin of 0.18.  Pain-free now.  Continue to cycle enzymes.  Begin IV heparin.  Plan to transfer to [facility] and do a cardiac cath tomorrow.I have reviewed the risks, indications, and alternatives to angioplasty and stenting with the patient. Risks include but are not limited to bleeding, infection, vascular injury, stroke, myocardial infection, arrhythmia, kidney injury, radiation-related injury in the case of prolonged fluoroscopy use, emergency cardiac surgery, and death. The patient understands the risks of serious complication is low (<0%) and patient agrees to proceed.   CAD status post CABG times three 07/15/2017  Hypertension blood pressure controlled  Hyperlipidemia on Lipitor 10 mg daily  Slight increase in bilirubins.  Will need rechecked   For questions or updates, please contact [place] Please consult [email or web address] for contact info under Cardiology/STEMI.   Sumner Boast, PA-C  11/02/2017 2:33 PM    Attending note:  Patient seen and examined.  I reviewed her records and discussed the case with Ms. Bonnell Public PA-C.  Gloria Mccoy follows with Dr. Harl Bowie, has a history of multivessel CAD status post CABG in January of this year with LIMA to  the LAD, SVG to the diagonal, and SVG to the ramus.  She has been participating in cardiac rehabilitation, reportedly without symptoms and compliant with her medications.  Today while exercising on the treadmill at cardiac rehabilitation she began to experience angina symptoms and had to stop what she was doing.  Symptoms spontaneously resolved, but she was sent to the ER for further assessment.  ECG showed no acute ST segment changes, however troponin I levels increased from less than 0.03 up to 0.18.  On evaluation in the ER, she reports no active chest pain.  Heart rate is in the 60s in sinus rhythm by telemetry, systolic blood pressure 299B to 140s.  Lungs are clear without labored breathing at rest.  Sternal  incision appears well-healed.  Cardiac exam reveals RRR without gallop.  Lab work shows potassium 4.5, BUN 11, creatinine 0.85, troponin I less than 0.03 up to 0.18, LDL 56, hemoglobin 13.1, platelets 199.  His x-ray showed bibasilar atelectasis but no infiltrates.  ECG showed sinus rhythm with old inferior infarct pattern, low voltage in the cardial leads, and nonspecific ST changes.  Patient presents after an episode of sudden onset exertional angina during cardiac rehabilitation consistent with symptoms prior to her CABG in January. Troponin I level has increased to 0.18 so far, and concern at this point is for NSTEMI, although currently chest pain-free.  She could potentially have had early graft failure.  Plan is to initiate heparin, continue medical therapy, and have her transferred to our cardiology service at [facility] in anticipation of a follow-up diagnostic cardiac catheterization to assess native coronary anatomy and bypass graft patency.  She is in agreement.  Satira Sark, M.D., F.A.C.C.

## 2017-11-02 NOTE — ED Triage Notes (Signed)
Pt was on treadmill at Cardiac Rehab today and started experiencing mid chest pain with shortness of breath. Pt's bp elevated at 190/90 at Cardiac Rehab. Pt symptoms resolved after stopping treadmill test. Pt has no chest pain or shortness of breath at this time. Pt states she had triple bypass surgery 3 months ago.

## 2017-11-02 NOTE — Progress Notes (Signed)
Incomplete Session Note  Patient Details  Name: Gloria Mccoy MRN: [id number] Date of Birth: [birth year] Referring Provider:     CARDIAC REHAB PHASE II ORIENTATION from 09/05/2017 in [place]  Referring Provider  Dr. August Saucer did not complete her rehab session.  Patient had no complaints at check in. Her initial b/p was elevated at 140/62. She started on the treadmill. At halfway, her b/p was 160/90. I told her that her blood pressure increased more than normal today and she said her chest "wasn't right". I stopped her and she described feeling tight in her chest and SOB. She said she never had felt this before. The tightness started as soon as she started walking she said and she thought it would go away but did not. The chest tightness subsided after she stopped walking on the treadmill. Her pressure after rest was 178/90 and her O2 saturation was 95. She was advised to go to the ED to be evaluated. She wanted to be checked out as well. She was transported to ED via [place].

## 2017-11-02 NOTE — ED Notes (Signed)
CRITICAL VALUE ALERT  Critical Value:  Troponin 0.18  Date & Time Notied: 11/02/2017 @ 9643  Provider Notified:  Roderic Palau  Orders Received/Actions taken: n/a

## 2017-11-02 NOTE — ED Provider Notes (Signed)
Signout from Dr. Roderic Palau.  72 year old female with an end STEMI being evaluated by cardiology and anticipated for transfer to [facility] for further cardiac management.  Checked with the Network engineer and they are just waiting for a bed at [facility] and then transfer will be called.   Hayden Rasmussen, MD 11/04/17 1038

## 2017-11-02 NOTE — Progress Notes (Signed)
ANTICOAGULATION CONSULT NOTE - Initial Consult  Pharmacy Consult for heparin Indication: chest pain/ACS  Allergies  Allergen Reactions  . Sulfa Antibiotics Shortness Of Breath  . Sulfasalazine Shortness Of Breath    Patient Measurements: Height: 5\' 2"  (157.5 cm) Weight: 195 lb (88.5 kg) IBW/kg (Calculated) : 50.1 HEPARIN DW (KG): 70.4  Vital Signs: Temp: 98 F (36.7 C) (05/15 0857) Temp Source: Oral (05/15 0857) BP: 120/58 (05/15 1930) Pulse Rate: 67 (05/15 1930)  Labs: Recent Labs    11/02/17 0934 11/02/17 1254  HGB 13.1  --   HCT 38.2  --   PLT 199  --   CREATININE 0.85  --   TROPONINI <0.03 0.18*    Estimated Creatinine Clearance: 61.9 mL/min (by C-G formula based on SCr of 0.85 mg/dL).   Medical History: Past Medical History:  Diagnosis Date  . Arthritis    "hands sometimes" (07/13/2017)  . Coronary artery disease    a. s/p CABG x3 in 06/2017 with LIMA-LAD, SVG-D1, and SVG-RI.    Marland Kitchen Dips tobacco   . Essential hypertension   . Hypothyroid   . Meniere disease   . Obesity   . Osteopenia 01/2012   T score -1.3 FRAX 7.9%/0.6%    Medications:  See med rec  Assessment: 72 yo female presents to ED with chest pain after walking on treadmill today. She had bypass surgery in January and has not had any chest pain since then. Pharmacy asked to start heparin. Pt okay for protocol.  Goal of Therapy:  Heparin level 0.3-0.7 units/ml Monitor platelets by anticoagulation protocol: Yes   Plan:  Give 4000 units bolus x 1 Start heparin infusion at 900 units/hr Check anti-Xa level in 6-8 hours and daily while on heparin Continue to monitor H&H and platelets  Trenton Gammon, [place] Frith L 11/02/2017,8:19 PM

## 2017-11-02 NOTE — ED Provider Notes (Signed)
[facility] EMERGENCY DEPARTMENT Provider Note   CSN: [id number] Arrival date & time: 11/02/17  3557     History   Chief Complaint Chief Complaint  Patient presents with  . Chest Pain    HPI Gloria Mccoy is a 72 y.o. female.  Patient states she was on a treadmill today and had chest pain for about 10 minutes.  Patient had bypass surgery in January and has not had this symptom since then  The history is provided by the patient. No language interpreter was used.  Chest Pain   This is a new problem. The current episode started less than 1 hour ago. The problem occurs rarely. The problem has been resolved. The pain is associated with exertion. The pain is present in the substernal region. The pain is at a severity of 5/10. The pain is moderate. The quality of the pain is described as brief. The pain does not radiate. Duration of episode(s) is 10 minutes. Pertinent negatives include no abdominal pain, no back pain, no cough and no headaches.  Pertinent negatives for past medical history include no seizures.    Past Medical History:  Diagnosis Date  . Arthritis    "hands sometimes" (07/13/2017)  . Coronary artery disease    a. s/p CABG x3 in 06/2017 with LIMA-LAD, SVG-D1, and SVG-RI.    Marland Kitchen Dips tobacco   . Essential hypertension   . Hypothyroid   . Meniere disease   . Obesity   . Osteopenia 01/2012   T score -1.3 FRAX 7.9%/0.6%    Patient Active Problem List   Diagnosis Date Noted  . S/P CABG x 3 07/15/2017  . Coronary artery disease 07/15/2017  . Unstable angina ([place]) 07/13/2017  . Essential hypertension 07/13/2017  . Obesity 07/13/2017  . Hyperglycemia 07/13/2017  . Hypothyroid 01/06/2012  . Meniere disease   . Arthritis     Past Surgical History:  Procedure Laterality Date  . CARDIAC CATHETERIZATION  07/13/2017  . CATARACT EXTRACTION W/PHACO Right 01/28/2015   Procedure: CATARACT EXTRACTION PHACO AND INTRAOCULAR LENS PLACEMENT :  CDE:  5.70;  Surgeon: Rutherford Guys, MD;  Location: [facility];  Service: Ophthalmology;  Laterality: Right;  . CATARACT EXTRACTION W/PHACO Left 02/11/2015   Procedure: CATARACT EXTRACTION PHACO AND INTRAOCULAR LENS PLACEMENT (IOC);  Surgeon: Rutherford Guys, MD;  Location: [facility];  Service: Ophthalmology;  Laterality: Left;  CDE: 7.38  . COLONOSCOPY N/A 09/26/2012   Procedure: COLONOSCOPY;  Surgeon: Jamesetta So, MD;  Location: [facility];  Service: Gastroenterology;  Laterality: N/A;  . CORONARY ARTERY BYPASS GRAFT N/A 07/15/2017   Procedure: CORONARY ARTERY BYPASS GRAFTING (CABG) X 3 USING LEFT INTERNAL MAMMARY ARTERY AND RIGHT SAPHENOUS VEIN- ENDOSCOPICALLY HARVESTED;  Surgeon: Melrose Nakayama, MD;  Location: [place];  Service: Open Heart Surgery;  Laterality: N/A;  . LEFT HEART CATH AND CORONARY ANGIOGRAPHY N/A 07/13/2017   Procedure: LEFT HEART CATH AND CORONARY ANGIOGRAPHY;  Surgeon: Martinique, Peter M, MD;  Location: [place] [facility];  Service: Cardiovascular;  Laterality: N/A;  . TEE WITHOUT CARDIOVERSION N/A 07/15/2017   Procedure: TRANSESOPHAGEAL ECHOCARDIOGRAM (TEE);  Surgeon: Melrose Nakayama, MD;  Location: [place];  Service: Open Heart Surgery;  Laterality: N/A;  . TUBAL LIGATION    . TYMPANOPLASTY Left    fluid from ear drum     OB History    Gravida  2   Para  2   Term  2   Preterm      AB  0   Living  2     SAB      TAB      Ectopic      Multiple      Live Births               Home Medications    Prior to Admission medications   Medication Sig Start Date End Date Taking? Authorizing Provider  acetaminophen (TYLENOL) 325 MG tablet Take 2 tablets (650 mg total) by mouth every 6 (six) hours as needed for mild pain. 07/20/17  Yes Nani Skillern, PA-C  aspirin EC 81 MG tablet Take 81 mg by mouth daily.   Yes [provider]  atorvastatin (LIPITOR) 10 MG tablet Take 1 tablet (10 mg total) by mouth daily at 6 PM. 09/09/17  Yes Branch, Alphonse Guild, MD  Cholecalciferol  (VITAMIN D PO) Take 1 tablet by mouth daily.    Yes [provider]  Cyanocobalamin (VITAMIN B-12 PO) Take 1 tablet by mouth daily.   Yes [provider]  fish oil-omega-3 fatty acids 1000 MG capsule Take 1 g by mouth daily.   Yes [provider]  furosemide (LASIX) 40 MG tablet Take 1 tablet (40 mg total) by mouth daily. 09/06/17  Yes Melrose Nakayama, MD  levothyroxine (SYNTHROID, LEVOTHROID) 75 MCG tablet Take 75 mcg by mouth daily.   Yes [provider]  lisinopril (PRINIVIL,ZESTRIL) 2.5 MG tablet Take 1 tablet (2.5 mg total) by mouth daily. 10/31/17 01/29/18 Yes Branch, Alphonse Guild, MD  metoprolol tartrate (LOPRESSOR) 25 MG tablet Take 1 tablet (25 mg total) by mouth 2 (two) times daily. 09/09/17  Yes BranchAlphonse Guild, MD  VITAMIN E PO Take 1 tablet by mouth daily.    Yes [provider]    Family History Family History  Problem Relation Age of Onset  . Diabetes Sister        AODM  . Heart disease Sister 18       CABG  . Heart disease Brother 43       In his 57s  . Cancer Maternal Uncle 60       COLON  . Diabetes Sister        AODM  . Hypertension Daughter   . Hypertension Daughter   . Breast cancer Cousin        PATERNAL COUSIN  . Heart attack Father        In his 38s    Social History Social History   Tobacco Use  . Smoking status: Never Smoker  . Smokeless tobacco: Former Systems developer    Types: Snuff  Substance Use Topics  . Alcohol use: Yes    Alcohol/week: 0.6 oz    Types: 1 Standard drinks or equivalent per week  . Drug use: No     Allergies   Sulfa antibiotics and Sulfasalazine   Review of Systems Review of Systems  Constitutional: Negative for appetite change and fatigue.  HENT: Negative for congestion, ear discharge and sinus pressure.   Eyes: Negative for discharge.  Respiratory: Negative for cough.   Cardiovascular: Positive for chest pain.  Gastrointestinal: Negative for abdominal pain and diarrhea.    Genitourinary: Negative for frequency and hematuria.  Musculoskeletal: Negative for back pain.  Skin: Negative for rash.  Neurological: Negative for seizures and headaches.  Psychiatric/Behavioral: Negative for hallucinations.     Physical Exam Updated Vital Signs BP 136/62   Pulse 60   Temp 98 F (36.7 C) (Oral)  Resp 18   Ht 5\' 2"  (1.575 m)   Wt 88.5 kg (195 lb)   SpO2 95%   BMI 35.67 kg/m   Physical Exam  Constitutional: She is oriented to person, place, and time. She appears well-developed.  HENT:  Head: Normocephalic.  Eyes: Conjunctivae and EOM are normal. No scleral icterus.  Neck: Neck supple. No thyromegaly present.  Cardiovascular: Normal rate and regular rhythm. Exam reveals no gallop and no friction rub.  No murmur heard. Pulmonary/Chest: No stridor. She has no wheezes. She has no rales. She exhibits no tenderness.  Abdominal: She exhibits no distension. There is no tenderness. There is no rebound.  Musculoskeletal: Normal range of motion. She exhibits no edema.  Lymphadenopathy:    She has no cervical adenopathy.  Neurological: She is oriented to person, place, and time. She exhibits normal muscle tone. Coordination normal.  Skin: No rash noted. No erythema.  Psychiatric: She has a normal mood and affect. Her behavior is normal.     ED Treatments / Results  Labs (all labs ordered are listed, but only abnormal results are displayed) Labs Reviewed  BASIC METABOLIC PANEL - Abnormal; Notable for the following components:      Result Value   Glucose, Bld 110 (*)    All other components within normal limits  CBC - Abnormal; Notable for the following components:   RDW 16.2 (*)    All other components within normal limits  HEPATIC FUNCTION PANEL - Abnormal; Notable for the following components:   Total Bilirubin 1.8 (*)    Bilirubin, Direct 0.6 (*)    Indirect Bilirubin 1.2 (*)    All other components within normal limits  DIFFERENTIAL - Abnormal;  Notable for the following components:   Monocytes Absolute 1.2 (*)    All other components within normal limits  TROPONIN I - Abnormal; Notable for the following components:   Troponin I 0.18 (*)    All other components within normal limits  TROPONIN I    EKG EKG Interpretation  Date/Time:  Wednesday Nov 02 2017 08:56:46 EDT Ventricular Rate:  64 PR Interval:    QRS Duration: 91 QT Interval:  477 QTC Calculation: 493 R Axis:   -12 Text Interpretation:  Sinus rhythm Inferior infarct, old Baseline wander in lead(s) I III aVL Confirmed by Milton Ferguson [phone]) on 11/02/2017 9:16:35 AM Also confirmed by Milton Ferguson [phone])  on 11/02/2017 1:49:41 PM   Radiology Dg Chest 2 View  Result Date: 11/02/2017 CLINICAL DATA:  Central chest pain this morning while on the treadmill at cardiac rehab, shortness of breath, history coronary disease post three-vessel CABG in January 2019, hypertension EXAM: CHEST - 2 VIEW COMPARISON:  08/23/2017 FINDINGS: Minimal enlargement of cardiac silhouette post CABG. Mediastinal contours and pulmonary vascularity normal. Mild bibasilar atelectasis. Lungs otherwise clear. No acute infiltrate, pleural effusion or pneumothorax. Bones unremarkable. IMPRESSION: Minimal enlargement of cardiac silhouette post CABG. Bibasilar atelectasis. Electronically Signed   By: Lavonia Dana M.D.   On: 11/02/2017 09:59    Procedures Procedures (including critical care time)  Medications Ordered in ED Medications - No data to display   Initial Impression / Assessment and Plan / ED Course  I have reviewed the triage vital signs and the nursing notes.  Pertinent labs & imaging results that were available during my care of the patient were reviewed by me and considered in my medical decision making (see chart for details).     CRITICAL CARE Performed by: Milton Ferguson Total critical  care time:35 minutes Critical care time was exclusive of separately billable procedures and  treating other patients. Critical care was necessary to treat or prevent imminent or life-threatening deterioration. Critical care was time spent personally by me on the following activities: development of treatment plan with patient and/or surrogate as well as nursing, discussions with consultants, evaluation of patient's response to treatment, examination of patient, obtaining history from patient or surrogate, ordering and performing treatments and interventions, ordering and review of laboratory studies, ordering and review of radiographic studies, pulse oximetry and re-evaluation of patient's condition. Patient with a history of coronary artery disease.  She had chest pain for 10 minutes.  Chemistries unremarkable CBC normal first troponin is normal second troponin was elevated 0.18 chest x-ray negative and EKG showed no acute changes.  Patient continued not to have any pain.  I spoke with cardiology and they will come see the patient decide what the best treatment for this.  Suspect coronary artery disease causing pain  Final Clinical Impressions(s) / ED Diagnoses   Final diagnoses:  NSTEMI (non-ST elevated myocardial infarction) [facility])    ED Discharge Orders    None       Milton Ferguson, MD 11/02/17 1438

## 2017-11-02 NOTE — Consult Note (Addendum)
Cardiology Consultation:   Patient ID: Gloria Mccoy; [id number]; Nov 18, 1945   Admit date: 11/02/2017 Date of Consult: 11/02/2017  Primary Care Provider: Redmond School, MD Primary Cardiologist: Carlyle Dolly, MD   Primary Electrophysiologist: N/A   Patient Profile:   Gloria Mccoy is a 72 y.o. female with a hx of CAD status post CABG times three 06/2017 who is being seen today for the evaluation of chest pain while at cardiac rehab at the request of Dr. Gerarda Fraction.  History of Present Illness:   [name] is a 72 year old patient of Dr. Harl Bowie who underwent CABG times three 06/2017 with LIMA to the LAD, SVG to the diagonal 1 and SVG to RI.  2D echo 06/2017 LVEF 55 to 60%.  Also has hypertension hyperlipidemia, obesity, dips tobacco(quit).  Just saw Dr. Harl Bowie 2 days ago and was doing well.  Had some dyspnea on exertion with high activities but no chest pain.  Patient was sent to the ED by cardiac rehab today when she had chest pain during rehab.  Initial troponin 0 0.03 but second troponin 0 0.18, creatinine normal direct bili slightly elevated at 0.6 total bili 1.8.  Patient is in the room comfortable with her husband and daughter.  She said while walking on treadmill today she developed chest tightness similar to her prior angina.  It went away within 10 minutes of stopping exercise.  No nitroglycerin given.  She is pain-free and comfortable right now.  She has had no angina since her bypass.  She just saw Dr. Harl Bowie 2 days ago and did cardiac rehab Monday without symptoms.  She is compliant with all medications.  Past Medical History:  Diagnosis Date  . Arthritis    "hands sometimes" (07/13/2017)  . Coronary artery disease    a. s/p CABG x3 in 06/2017 with LIMA-LAD, SVG-D1, and SVG-RI.    Marland Kitchen Dips tobacco   . Essential hypertension   . Hypothyroid   . Meniere disease   . Obesity   . Osteopenia 01/2012   T score -1.3 FRAX 7.9%/0.6%    Past Surgical History:  Procedure  Laterality Date  . CARDIAC CATHETERIZATION  07/13/2017  . CATARACT EXTRACTION W/PHACO Right 01/28/2015   Procedure: CATARACT EXTRACTION PHACO AND INTRAOCULAR LENS PLACEMENT :  CDE:  5.70;  Surgeon: Rutherford Guys, MD;  Location: [facility];  Service: Ophthalmology;  Laterality: Right;  . CATARACT EXTRACTION W/PHACO Left 02/11/2015   Procedure: CATARACT EXTRACTION PHACO AND INTRAOCULAR LENS PLACEMENT (IOC);  Surgeon: Rutherford Guys, MD;  Location: [facility];  Service: Ophthalmology;  Laterality: Left;  CDE: 7.38  . COLONOSCOPY N/A 09/26/2012   Procedure: COLONOSCOPY;  Surgeon: Jamesetta So, MD;  Location: [facility];  Service: Gastroenterology;  Laterality: N/A;  . CORONARY ARTERY BYPASS GRAFT N/A 07/15/2017   Procedure: CORONARY ARTERY BYPASS GRAFTING (CABG) X 3 USING LEFT INTERNAL MAMMARY ARTERY AND RIGHT SAPHENOUS VEIN- ENDOSCOPICALLY HARVESTED;  Surgeon: Melrose Nakayama, MD;  Location: [facility];  Service: Open Heart Surgery;  Laterality: N/A;  . LEFT HEART CATH AND CORONARY ANGIOGRAPHY N/A 07/13/2017   Procedure: LEFT HEART CATH AND CORONARY ANGIOGRAPHY;  Surgeon: Martinique, Peter M, MD;  Location: [facility];  Service: Cardiovascular;  Laterality: N/A;  . TEE WITHOUT CARDIOVERSION N/A 07/15/2017   Procedure: TRANSESOPHAGEAL ECHOCARDIOGRAM (TEE);  Surgeon: Melrose Nakayama, MD;  Location: [facility];  Service: Open Heart Surgery;  Laterality: N/A;  . TUBAL LIGATION    . TYMPANOPLASTY Left    fluid from ear  drum     Home Medications:  Prior to Admission medications   Medication Sig Start Date End Date Taking? Authorizing Provider  acetaminophen (TYLENOL) 325 MG tablet Take 2 tablets (650 mg total) by mouth every 6 (six) hours as needed for mild pain. 07/20/17  Yes Nani Skillern, PA-C  aspirin EC 81 MG tablet Take 81 mg by mouth daily.   Yes [provider]  atorvastatin (LIPITOR) 10 MG tablet Take 1 tablet (10 mg total) by mouth daily at 6 PM. 09/09/17  Yes Branch, Alphonse Guild, MD    Cholecalciferol (VITAMIN D PO) Take 1 tablet by mouth daily.    Yes [provider]  Cyanocobalamin (VITAMIN B-12 PO) Take 1 tablet by mouth daily.   Yes [provider]  fish oil-omega-3 fatty acids 1000 MG capsule Take 1 g by mouth daily.   Yes [provider]  furosemide (LASIX) 40 MG tablet Take 1 tablet (40 mg total) by mouth daily. 09/06/17  Yes Melrose Nakayama, MD  levothyroxine (SYNTHROID, LEVOTHROID) 75 MCG tablet Take 75 mcg by mouth daily.   Yes [provider]  lisinopril (PRINIVIL,ZESTRIL) 2.5 MG tablet Take 1 tablet (2.5 mg total) by mouth daily. 10/31/17 01/29/18 Yes Branch, Alphonse Guild, MD  metoprolol tartrate (LOPRESSOR) 25 MG tablet Take 1 tablet (25 mg total) by mouth 2 (two) times daily. 09/09/17  Yes BranchAlphonse Guild, MD  VITAMIN E PO Take 1 tablet by mouth daily.    Yes [provider]    Inpatient Medications: Scheduled Meds:  Continuous Infusions:  PRN Meds:   Allergies:    Allergies  Allergen Reactions  . Sulfa Antibiotics Shortness Of Breath  . Sulfasalazine Shortness Of Breath    Social History:   Social History   Socioeconomic History  . Marital status: Married    Spouse name: Not on file  . Number of children: Not on file  . Years of education: Not on file  . Highest education level: Not on file  Occupational History  . Occupation: Retired  Scientific laboratory technician  . Financial resource strain: Not on file  . Food insecurity:    Worry: Not on file    Inability: Not on file  . Transportation needs:    Medical: Not on file    Non-medical: Not on file  Tobacco Use  . Smoking status: Never Smoker  . Smokeless tobacco: Former Systems developer    Types: Snuff  Substance and Sexual Activity  . Alcohol use: Yes    Alcohol/week: 0.6 oz    Types: 1 Standard drinks or equivalent per week  . Drug use: No  . Sexual activity: Yes    Birth control/protection: Surgical  Lifestyle  . Physical activity:    Days per week:  Not on file    Minutes per session: Not on file  . Stress: Not on file  Relationships  . Social connections:    Talks on phone: Not on file    Gets together: Not on file    Attends religious service: Not on file    Active member of club or organization: Not on file    Attends meetings of clubs or organizations: Not on file    Relationship status: Not on file  . Intimate partner violence:    Fear of current or ex partner: Not on file    Emotionally abused: Not on file    Physically abused: Not on file    Forced sexual activity: Not on file  Other Topics Concern  . Not on file  Social History Narrative  . Not on file    Family History:    Family History  Problem Relation Age of Onset  . Diabetes Sister        AODM  . Heart disease Sister 40       CABG  . Heart disease Brother 47       In his 22s  . Cancer Maternal Uncle 60       COLON  . Diabetes Sister        AODM  . Hypertension Daughter   . Hypertension Daughter   . Breast cancer Cousin        PATERNAL COUSIN  . Heart attack Father        In his 72s     ROS:  Please see the history of present illness.  Review of Systems  Constitution: Negative.  HENT: Negative.   Eyes: Negative.   Cardiovascular: Positive for chest pain.  Respiratory: Negative.   Hematologic/Lymphatic: Negative.   Musculoskeletal: Negative.  Negative for joint pain.  Gastrointestinal: Negative.   Genitourinary: Negative.   Neurological: Negative.     All other ROS reviewed and negative.     Physical Exam/Data:   Vitals:   11/02/17 1300 11/02/17 1315 11/02/17 1330 11/02/17 1418  BP: 132/86  (!) 139/57 136/62  Pulse:    60  Resp: 20 18 (!) 21 18  Temp:      TempSrc:      SpO2:    95%  Weight:      Height:       No intake or output data in the 24 hours ending 11/02/17 1433 Filed Weights   11/02/17 0858  Weight: 195 lb (88.5 kg)   Body mass index is 35.67 kg/m.  General: Obese, in no acute distress  Lymph: no  adenopathy Neck: no JVD Endocrine:  No thryomegaly Vascular: No carotid bruits; FA pulses 2+ bilaterally without bruits  Cardiac: Incision healing well normal S1, S2; RRR; no murmur  Lungs:  clear to auscultation bilaterally, no wheezing, rhonchi or rales  Abd: soft, nontender, no hepatomegaly  Ext: no edema Musculoskeletal:  No deformities, BUE and BLE strength normal and equal Skin: warm and dry  Neuro:  CNs 2-12 intact, no focal abnormalities noted Psych:  Normal affect   EKG:  The EKG was personally reviewed and demonstrates: Normal sinus rhythm with inferior Q waves poor R wave progression anteriorly unchanged from prior tracing Telemetry:  Telemetry was personally reviewed and demonstrates: Normal sinus rhythm  Relevant CV Studies: Jan 2019 cath  Mid LM lesion is 25% stenosed.  Dist LM to Ost LAD lesion is 80% stenosed.  Ost Cx to Prox Cx lesion is 30% stenosed.  Ost 1st Mrg lesion is 50% stenosed.  Prox LAD lesion is 90% stenosed.  The left ventricular systolic function is normal.  LV end diastolic pressure is normal.  The left ventricular ejection fraction is 55-65% by visual estimate.   1. Single vessel obstructive CAD. Patient has complex ostial and proximal to mid LAD disease that involves the origin of the ramus intermediate and first diagonal respectively. 2. Normal LV function 3. Normal LVEDP   Plan: given complexity of lesions with ostial and bifurcation LAD disease I would recommend consideration for CABG.   Jan 2019 Carotid US Final Interpretation: Right Carotid: Velocities in the right ICA are consistent with a 1-39% stenosis.  Left Carotid: Velocities in the left ICA are  consistent with a 1-39% stenosis. Vertebrals:  Both vertebral arteries were patent with antegrade flow. Subclavians:   Jan 2019 echo Study Conclusions   - Left ventricle: The cavity size was normal. Wall thickness was   increased in a pattern of mild LVH. Systolic function was  normal.   The estimated ejection fraction was in the range of 55% to 60%.   Wall motion was normal; there were no regional wall motion   abnormalities. Doppler parameters are consistent with abnormal   left ventricular relaxation (grade 1 diastolic dysfunction). - Mitral valve: Valve area by pressure half-time: 1.26 cm^2.   Impressions:   - Normal LV systolic function; mild LVH; mild diastolic   dysfunction.     Laboratory Data:  Chemistry Recent Labs  Lab 11/02/17 0934  NA 140  K 4.5  CL 102  CO2 29  GLUCOSE 110*  BUN 11  CREATININE 0.85  CALCIUM 8.9  GFRNONAA >60  GFRAA >60  ANIONGAP 9    Recent Labs  Lab 10/28/17 0927 11/02/17 0935  PROT 8.1 7.9  ALBUMIN 3.8 3.7  AST 19 38  ALT 14 17  ALKPHOS 63 64  BILITOT 0.9 1.8*   Hematology Recent Labs  Lab 11/02/17 0934  WBC 9.3  RBC 4.77  HGB 13.1  HCT 38.2  MCV 80.1  MCH 27.5  MCHC 34.3  RDW 16.2*  PLT 199   Cardiac Enzymes Recent Labs  Lab 11/02/17 0934 11/02/17 1254  TROPONINI <0.03 0.18*   No results for input(s): TROPIPOC in the last 168 hours.  BNPNo results for input(s): BNP, PROBNP in the last 168 hours.  DDimer No results for input(s): DDIMER in the last 168 hours.  Radiology/Studies:  Dg Chest 2 View  Result Date: 11/02/2017 CLINICAL DATA:  Central chest pain this morning while on the treadmill at cardiac rehab, shortness of breath, history coronary disease post three-vessel CABG in January 2019, hypertension EXAM: CHEST - 2 VIEW COMPARISON:  08/23/2017 FINDINGS: Minimal enlargement of cardiac silhouette post CABG. Mediastinal contours and pulmonary vascularity normal. Mild bibasilar atelectasis. Lungs otherwise clear. No acute infiltrate, pleural effusion or pneumothorax. Bones unremarkable. IMPRESSION: Minimal enlargement of cardiac silhouette post CABG. Bibasilar atelectasis. Electronically Signed   By: Lavonia Dana M.D.   On: 11/02/2017 09:59    Assessment and Plan:   Chest pain during  cardiac rehab with elevated troponin of 0.18.  Pain-free now.  Continue to cycle enzymes.  Begin IV heparin.  Plan to transfer to [facility] and do a cardiac cath tomorrow.I have reviewed the risks, indications, and alternatives to angioplasty and stenting with the patient. Risks include but are not limited to bleeding, infection, vascular injury, stroke, myocardial infection, arrhythmia, kidney injury, radiation-related injury in the case of prolonged fluoroscopy use, emergency cardiac surgery, and death. The patient understands the risks of serious complication is low (<3%) and patient agrees to proceed.   CAD status post CABG times three 07/15/2017  Hypertension blood pressure controlled  Hyperlipidemia on Lipitor 10 mg daily  Slight increase in bilirubins.  Will need rechecked   For questions or updates, please contact [facility] Please consult [email or web address] for contact info under Cardiology/STEMI.   Sumner Boast, PA-C  11/02/2017 2:33 PM    Attending note:  Patient seen and examined.  I reviewed her records and discussed the case with Ms. Bonnell Public PA-C.  Ms. Printup follows with Dr. Harl Bowie, has a history of multivessel CAD status post CABG in January of this year with LIMA to  the LAD, SVG to the diagonal, and SVG to the ramus.  She has been participating in cardiac rehabilitation, reportedly without symptoms and compliant with her medications.  Today while exercising on the treadmill at cardiac rehabilitation she began to experience angina symptoms and had to stop what she was doing.  Symptoms spontaneously resolved, but she was sent to the ER for further assessment.  ECG showed no acute ST segment changes, however troponin I levels increased from less than 0.03 up to 0.18.  On evaluation in the ER, she reports no active chest pain.  Heart rate is in the 60s in sinus rhythm by telemetry, systolic blood pressure 532D to 140s.  Lungs are clear without labored breathing at rest.  Sternal  incision appears well-healed.  Cardiac exam reveals RRR without gallop.  Lab work shows potassium 4.5, BUN 11, creatinine 0.85, troponin I less than 0.03 up to 0.18, LDL 56, hemoglobin 13.1, platelets 199.  His x-ray showed bibasilar atelectasis but no infiltrates.  ECG showed sinus rhythm with old inferior infarct pattern, low voltage in the cardial leads, and nonspecific ST changes.  Patient presents after an episode of sudden onset exertional angina during cardiac rehabilitation consistent with symptoms prior to her CABG in January. Troponin I level has increased to 0.18 so far, and concern at this point is for NSTEMI, although currently chest pain-free.  She could potentially have had early graft failure.  Plan is to initiate heparin, continue medical therapy, and have her transferred to our cardiology service at [facility] in anticipation of a follow-up diagnostic cardiac catheterization to assess native coronary anatomy and bypass graft patency.  She is in agreement.  Satira Sark, M.D., F.A.C.C.

## 2017-11-03 ENCOUNTER — Inpatient Hospital Stay (HOSPITAL_COMMUNITY)
Admission: EM | Disposition: A | Discharge status: Home / Self Care | Payer: Self-pay | Source: Home / Self Care | Attending: Cardiology

## 2017-11-03 ENCOUNTER — Encounter (HOSPITAL_COMMUNITY): Payer: Self-pay | Admitting: Cardiovascular Disease

## 2017-11-03 DIAGNOSIS — I2510 Atherosclerotic heart disease of native coronary artery without angina pectoris: Secondary | ICD-10-CM

## 2017-11-03 DIAGNOSIS — E7800 Pure hypercholesterolemia, unspecified: Secondary | ICD-10-CM

## 2017-11-03 DIAGNOSIS — I10 Essential (primary) hypertension: Secondary | ICD-10-CM

## 2017-11-03 HISTORY — PX: LEFT HEART CATH AND CORS/GRAFTS ANGIOGRAPHY: CATH118250

## 2017-11-03 LAB — PROTIME-INR
INR: 1.08
PROTHROMBIN TIME: 13.9 s (ref 11.4–15.2)

## 2017-11-03 LAB — POCT ACTIVATED CLOTTING TIME: ACTIVATED CLOTTING TIME: 131 s

## 2017-11-03 SURGERY — LEFT HEART CATH AND CORS/GRAFTS ANGIOGRAPHY
Anesthesia: LOCAL

## 2017-11-03 MED ORDER — ASPIRIN 81 MG PO CHEW: 81.0000 mg | CHEWABLE_TABLET | Freq: Every day | ORAL | Status: DC

## 2017-11-03 MED ORDER — LIDOCAINE HCL (PF) 1 % IJ SOLN
INTRAMUSCULAR | Status: AC
  Filled 2017-11-03: qty 30

## 2017-11-03 MED ORDER — HEPARIN SODIUM (PORCINE) 5000 UNIT/ML IJ SOLN: 5000.0000 [IU] | Freq: Three times a day (TID) | INTRAMUSCULAR | Status: DC

## 2017-11-03 MED ORDER — LIDOCAINE HCL (PF) 1 % IJ SOLN
INTRAMUSCULAR | Status: DC | PRN
  Administered 2017-11-03: 15 mL

## 2017-11-03 MED ORDER — SODIUM CHLORIDE 0.9% FLUSH
3.0000 mL | INTRAVENOUS | Status: DC | PRN
Start: 2017-11-03 — End: 2017-11-03

## 2017-11-03 MED ORDER — SODIUM CHLORIDE 0.9 % IV SOLN
INTRAVENOUS | Status: DC
  Administered 2017-11-03: 12:00:00 via INTRAVENOUS

## 2017-11-03 MED ORDER — IOHEXOL 350 MG/ML SOLN
INTRAVENOUS | Status: DC | PRN
  Administered 2017-11-03: 115 mL

## 2017-11-03 MED ORDER — FENTANYL CITRATE (PF) 100 MCG/2ML IJ SOLN
INTRAMUSCULAR | Status: DC | PRN
  Administered 2017-11-03 (×2): 25 ug via INTRAVENOUS

## 2017-11-03 MED ORDER — HEPARIN (PORCINE) IN NACL 2-0.9 UNITS/ML
INTRAMUSCULAR | Status: AC | PRN
  Administered 2017-11-03 (×2): 500 mL

## 2017-11-03 MED ORDER — MIDAZOLAM HCL 2 MG/2ML IJ SOLN
INTRAMUSCULAR | Status: DC | PRN
  Administered 2017-11-03: 2 mg via INTRAVENOUS

## 2017-11-03 MED ORDER — HEPARIN (PORCINE) IN NACL 1000-0.9 UT/500ML-% IV SOLN
INTRAVENOUS | Status: AC
  Filled 2017-11-03: qty 1000

## 2017-11-03 MED ORDER — ONDANSETRON HCL 4 MG/2ML IJ SOLN: 4.0000 mg | Freq: Four times a day (QID) | INTRAMUSCULAR | Status: DC | PRN

## 2017-11-03 MED ORDER — CLOPIDOGREL BISULFATE 75 MG PO TABS
75.0000 mg | ORAL_TABLET | Freq: Every day | ORAL | Status: DC
  Administered 2017-11-03: 75 mg via ORAL
  Filled 2017-11-03: qty 1

## 2017-11-03 MED ORDER — FENTANYL CITRATE (PF) 100 MCG/2ML IJ SOLN
INTRAMUSCULAR | Status: AC
  Filled 2017-11-03: qty 2

## 2017-11-03 MED ORDER — ACETAMINOPHEN 325 MG PO TABS: 650.0000 mg | ORAL_TABLET | ORAL | Status: DC | PRN

## 2017-11-03 MED ORDER — ATORVASTATIN CALCIUM 80 MG PO TABS: 80.0000 mg | ORAL_TABLET | Freq: Every day | ORAL | Status: DC

## 2017-11-03 MED ORDER — SODIUM CHLORIDE 0.9% FLUSH: 3.0000 mL | Freq: Two times a day (BID) | INTRAVENOUS | Status: DC

## 2017-11-03 MED ORDER — CLOPIDOGREL BISULFATE 75 MG PO TABS: 75.0000 mg | ORAL_TABLET | Freq: Every day | ORAL | 3 refills | Status: DC

## 2017-11-03 MED ORDER — SODIUM CHLORIDE 0.9 % IV SOLN
250.0000 mL | INTRAVENOUS | Status: DC | PRN
Start: 2017-11-03 — End: 2017-11-03

## 2017-11-03 MED ORDER — ATORVASTATIN CALCIUM 80 MG PO TABS: 80.0000 mg | ORAL_TABLET | Freq: Every day | ORAL | 3 refills | Status: DC

## 2017-11-03 MED ORDER — MIDAZOLAM HCL 2 MG/2ML IJ SOLN
INTRAMUSCULAR | Status: AC
  Filled 2017-11-03: qty 2

## 2017-11-03 SURGICAL SUPPLY — 10 items
CATH INFINITI 5 FR IM (CATHETERS) ×2 IMPLANT
CATH INFINITI 5FR MULTPACK ANG (CATHETERS) ×2 IMPLANT
KIT ESSENTIALS PG (KITS) IMPLANT
KIT HEART LEFT (KITS) ×2 IMPLANT
PACK CARDIAC CATHETERIZATION (CUSTOM PROCEDURE TRAY) ×2 IMPLANT
SHEATH AVANTI 11CM 5FR (SHEATH) ×2 IMPLANT
SYR MEDRAD MARK V 150ML (SYRINGE) ×2 IMPLANT
TRANSDUCER W/STOPCOCK (MISCELLANEOUS) ×2 IMPLANT
TUBING CIL FLEX 10 FLL-RA (TUBING) ×2 IMPLANT
WIRE EMERALD 3MM-J .035X150CM (WIRE) ×2 IMPLANT

## 2017-11-03 NOTE — Discharge Summary (Addendum)
Discharge Summary    Patient ID: Gloria Mccoy,  MRN: [id number], DOB/AGE: 1946-05-06 72 y.o.  Admit date: 11/02/2017 Discharge date: 11/03/2017  Primary Care Provider: [employer] Primary Cardiologist: Carlyle Dolly, MD  Discharge Diagnoses    Active Problems:   Unstable angina [facility])   Essential hypertension   S/P CABG x 3   Coronary artery disease   NSTEMI (non-ST elevated myocardial infarction) ([place])   Allergies Allergies  Allergen Reactions  . Sulfa Antibiotics Shortness Of Breath  . Sulfasalazine Shortness Of Breath    Diagnostic Studies/Procedures    Left Heart Catheterization 11/03/17: Conclusion     Ost 1st Diag to 1st Diag lesion is 40% stenosed.  Ost Cx lesion is 25% stenosed.  Ost Ramus lesion is 20% stenosed.  Ost LAD lesion is 45% stenosed.  Prox LAD to Mid LAD lesion is 80% stenosed.  Origin lesion is 100% stenosed.  Origin lesion is 99% stenosed.  Origin to Dist Graft lesion is 99% stenosed.  Dist Graft lesion is 100% stenosed.  LV end diastolic pressure is mildly elevated.  The left ventricular systolic function is normal.   Hyperdynamic LV function with an EF of 65% without focal segmental wall motion abnormalities.  Native coronary obstructive disease with 40% ostial LAD stenosis, diffuse 40 to 50% proximal diagonal stenosis with 80% LAD stenosis diffusely after the diagonal takeoff and evidence for competitive filling to the mid LAD via the LIMA graft; 25% ostial smooth narrowing in the ramus intermediate vessel; 25% ostial smooth narrowing in the left circumflex vessel; normal RCA.  Widely patent LIMA graft which supplies the mid LAD.  Totally ostial occlusion of the SVG which had supplied the ramus intermediate vessel.  Subtotally occluded ostial vein graft with diffuse 99% narrowing insistent with an atretic graft  which does not fill the diagonal vessel.  RECOMMENDATION: Medical therapy.  I suspect the early  vein graft occlusion was contributed by improvement in the prior pre-CABG stenoses.     Diagnostic Diagram      _____________   History of Present Illness     Gloria Mccoy is a 72 year old patient of Dr. Harl Bowie who underwent CABG times three 06/2017 with LIMA to the LAD, SVG to the diagonal 1 and SVG to RI.  2D echo 06/2017 LVEF 55 to 60%.  Also has hypertension hyperlipidemia, obesity, dips tobacco(quit).  Just saw Dr. Harl Bowie 10/31/17 and was doing well.  Had some dyspnea on exertion with high activities but no chest pain.  Patient was sent to the ED by cardiac rehab 11/02/17 when she had chest pain during rehab.  Initial troponin 0.03 but second troponin 0 0.18, creatinine normal direct bili slightly elevated at 0.6 total bili 1.8.  At the time of admission, patient was in the room comfortable with her husband and daughter.  She said while walking on treadmill 11/02/17 she developed chest tightness similar to her prior angina.  It went away within 10 minutes of stopping exercise.  No nitroglycerin given.  She was pain-free and comfortable at the time of admission.  She has had no angina since her bypass.  She just saw Dr. Harl Bowie 2 days ago and did cardiac rehab Monday without symptoms.  She is compliant with all medications.     Hospital Course     Consultants: None   1. Unstable angina: patient presented with exertional chest pain reminiscent of pre-bypass angina. EKG without ischemic changes. Trop initially negative, increased slightly to peak 0.18. She underwent  LHC 11/03/17 which revealed a subtotal occulusion of SVG to diagonal graft and patent LIMA to LAD. She was recommended for medical management and aggressive risk modifications - She was started on plavix 75mg  for at least the next year for medical management - Atorvastatin increased form 10mg  daily to 80mg  daily - Continued ASA - Continued cardiac rehab - If chest pain reoccurs, consider imdur  2. Hypertension: BP stable -  Continued metoprolol, lisinopril, and lasix   3. Dyslipidemia:  - Atorvastatin increased to 80mg  daily  4. Hypothyroidism:  - Continued home levothyroxine   _____________  Discharge Vitals Blood pressure 107/81, pulse 70, temperature 97.7 F (36.5 C), temperature source Oral, resp. rate 17, height 5\' 2"  (1.575 m), weight 189 lb 11.2 oz (86 kg), SpO2 100 %.  Filed Weights   11/02/17 0858 11/02/17 2122 11/03/17 0359  Weight: 195 lb (88.5 kg) 190 lb 11.2 oz (86.5 kg) 189 lb 11.2 oz (86 kg)    Labs & Radiologic Studies    CBC Recent Labs    11/02/17 0934  WBC 9.3  NEUTROABS 4.6  HGB 13.1  HCT 38.2  MCV 80.1  PLT 637   Basic Metabolic Panel Recent Labs    11/02/17 0934  NA 140  K 4.5  CL 102  CO2 29  GLUCOSE 110*  BUN 11  CREATININE 0.85  CALCIUM 8.9   Liver Function Tests Recent Labs    11/02/17 0935  AST 38  ALT 17  ALKPHOS 64  BILITOT 1.8*  PROT 7.9  ALBUMIN 3.7   No results for input(s): LIPASE, AMYLASE in the last 72 hours. Cardiac Enzymes Recent Labs    11/02/17 0934 11/02/17 1254  TROPONINI <0.03 0.18*   BNP Invalid input(s): POCBNP D-Dimer No results for input(s): DDIMER in the last 72 hours. Hemoglobin A1C No results for input(s): HGBA1C in the last 72 hours. Fasting Lipid Panel No results for input(s): CHOL, HDL, LDLCALC, TRIG, CHOLHDL, LDLDIRECT in the last 72 hours. Thyroid Function Tests No results for input(s): TSH, T4TOTAL, T3FREE, THYROIDAB in the last 72 hours.  Invalid input(s): FREET3 _____________  Dg Chest 2 View  Result Date: 11/02/2017 CLINICAL DATA:  Central chest pain this morning while on the treadmill at cardiac rehab, shortness of breath, history coronary disease post three-vessel CABG in January 2019, hypertension EXAM: CHEST - 2 VIEW COMPARISON:  08/23/2017 FINDINGS: Minimal enlargement of cardiac silhouette post CABG. Mediastinal contours and pulmonary vascularity normal. Mild bibasilar atelectasis. Lungs  otherwise clear. No acute infiltrate, pleural effusion or pneumothorax. Bones unremarkable. IMPRESSION: Minimal enlargement of cardiac silhouette post CABG. Bibasilar atelectasis. Electronically Signed   By: Lavonia Dana M.D.   On: 11/02/2017 09:59   Disposition   Patient was seen and examined by Dr. Marlou Porch who deemed patient as stable for discharge. Follow-up has been arranged. Discharge medications as listed below.   Follow-up Plans & Appointments    Follow-up Information    Erma Heritage, PA-C Follow up on 11/18/2017.   Specialties:  Physician Assistant, Cardiology Why:  Please arrive 15 minutes early for your 1:30pm appointment Contact information: [place] [id number] [phone]          Discharge Instructions    Diet - low sodium heart healthy   Complete by:  As directed    Increase activity slowly   Complete by:  As directed       Discharge Medications   Allergies as of 11/03/2017      Reactions  Sulfa Antibiotics Shortness Of Breath   Sulfasalazine Shortness Of Breath      Medication List    TAKE these medications   acetaminophen 325 MG tablet Commonly known as:  TYLENOL Take 2 tablets (650 mg total) by mouth every 6 (six) hours as needed for mild pain.   aspirin EC 81 MG tablet Take 81 mg by mouth daily.   atorvastatin 80 MG tablet Commonly known as:  LIPITOR Take 1 tablet (80 mg total) by mouth daily at 6 PM. What changed:    medication strength  how much to take   clopidogrel 75 MG tablet Commonly known as:  PLAVIX Take 1 tablet (75 mg total) by mouth daily. Start taking on:  11/04/2017   fish oil-omega-3 fatty acids 1000 MG capsule Take 1 g by mouth daily.   furosemide 40 MG tablet Commonly known as:  LASIX Take 1 tablet (40 mg total) by mouth daily. Notes to patient:  Do not take this medication today. You can restart lasix 11/04/17   levothyroxine 75 MCG tablet Commonly known as:  SYNTHROID, LEVOTHROID Take 75 mcg  by mouth daily.   lisinopril 2.5 MG tablet Commonly known as:  PRINIVIL,ZESTRIL Take 1 tablet (2.5 mg total) by mouth daily. Notes to patient:  Do not take this medication today. You can restart lisinopril 11/04/17   metoprolol tartrate 25 MG tablet Commonly known as:  LOPRESSOR Take 1 tablet (25 mg total) by mouth 2 (two) times daily.   VITAMIN B-12 PO Take 1 tablet by mouth daily.   VITAMIN D PO Take 1 tablet by mouth daily.   VITAMIN E PO Take 1 tablet by mouth daily.        Aspirin prescribed at discharge?  Yes High Intensity Statin Prescribed? (Lipitor 40-80mg  or Crestor 20-40mg ): Yes Beta Blocker Prescribed? No: patient is bradycardic For EF <40%, was ACEI/ARB Prescribed? Yes ADP Receptor Inhibitor Prescribed? (i.e. Plavix etc.-Includes Medically Managed Patients): Yes For EF <40%, Aldosterone Inhibitor Prescribed? No: EF >40% Was EF assessed during THIS hospitalization? Yes Was Cardiac Rehab II ordered? (Included Medically managed Patients): Yes   Outstanding Labs/Studies   None  Duration of Discharge Encounter   Greater than 30 minutes including physician time.  Signed, Abigail Butts PA-C 11/03/2017, 12:18 PM    Feels well post catheterization which led to medical management.  No chest pain, no shortness of breath.  Inpatient Medications    Scheduled Meds: . [START ON 11/04/2017] aspirin  81 mg Oral Daily  . atorvastatin  80 mg Oral q1800  . heparin  5,000 Units Subcutaneous Q8H  . sodium chloride flush  3 mL Intravenous Q12H   Continuous Infusions: . sodium chloride 150 mL/hr at 11/03/17 1011  . sodium chloride     PRN Meds: sodium chloride, acetaminophen, ondansetron (ZOFRAN) IV, sodium chloride flush   Vital Signs          Vitals:   11/03/17 0932 11/03/17 0947 11/03/17 1002 11/03/17 1018  BP: (!) 132/55 (!) 131/57 130/65 107/81  Pulse: (!) 52 (!) 50 (!) 58 70  Resp:      Temp:      TempSrc:      SpO2: 100% 100% 100%  100%  Weight:      Height:       No intake or output data in the 24 hours ending 11/03/17 1131      Filed Weights   11/02/17 0858 11/02/17 2122 11/03/17 0359  Weight: 195 lb (88.5 kg) 190 lb  11.2 oz (86.5 kg) 189 lb 11.2 oz (86 kg)    Telemetry    No adverse arrhythmias- Personally Reviewed  ECG    Sinus rhythm- Personally Reviewed  Physical Exam   GEN:No acute distress.  Overweight Neck:No JVD Cardiac:RRR, no murmurs, rubs, or gallops.  CABG scar Respiratory:Clear to auscultation bilaterally. AS:NKNL, nontender, non-distended  MS:No edema; No deformity.  Authorization site intact Neuro:Nonfocal  Psych: Normal affect   Labs    Chemistry LastLabs       Recent Labs  Lab 10/28/17 0927 11/02/17 0934 11/02/17 0935  NA  --  140  --   K  --  4.5  --   CL  --  102  --   CO2  --  29  --   GLUCOSE  --  110*  --   BUN  --  11  --   CREATININE  --  0.85  --   CALCIUM  --  8.9  --   PROT 8.1  --  7.9  ALBUMIN 3.8  --  3.7  AST 19  --  38  ALT 14  --  17  ALKPHOS 63  --  64  BILITOT 0.9  --  1.8*  GFRNONAA  --  >60  --   GFRAA  --  >60  --   ANIONGAP  --  9  --        Hematology LastLabs     Recent Labs  Lab 11/02/17 0934  WBC 9.3  RBC 4.77  HGB 13.1  HCT 38.2  MCV 80.1  MCH 27.5  MCHC 34.3  RDW 16.2*  PLT 199      Cardiac Enzymes LastLabs      Recent Labs  Lab 11/02/17 0934 11/02/17 1254  TROPONINI <0.03 0.18*      LastLabs  No results for input(s): TROPIPOC in the last 168 hours.     BNP LastLabs  No results for input(s): BNP, PROBNP in the last 168 hours.     DDimer  Elie Confer  No results for input(s): DDIMER in the last 168 hours.     Radiology     ImagingResults(Last48hours)  Dg Chest 2 View  Result Date: 11/02/2017 CLINICAL DATA:  Central chest pain this morning while on the treadmill at cardiac rehab, shortness of breath, history coronary disease post three-vessel CABG in  January 2019, hypertension EXAM: CHEST - 2 VIEW COMPARISON:  08/23/2017 FINDINGS: Minimal enlargement of cardiac silhouette post CABG. Mediastinal contours and pulmonary vascularity normal. Mild bibasilar atelectasis. Lungs otherwise clear. No acute infiltrate, pleural effusion or pneumothorax. Bones unremarkable. IMPRESSION: Minimal enlargement of cardiac silhouette post CABG. Bibasilar atelectasis. Electronically Signed   By: Lavonia Dana M.D.   On: 11/02/2017 09:59     Cardiac Studies   Cardiac catheterization personally reviewed.   Ost 1st Diag to 1st Diag lesion is 40% stenosed.  Ost Cx lesion is 25% stenosed.  Ost Ramus lesion is 20% stenosed.  Ost LAD lesion is 45% stenosed.  Prox LAD to Mid LAD lesion is 80% stenosed.  Origin lesion is 100% stenosed.  Origin lesion is 99% stenosed.  Origin to Dist Graft lesion is 99% stenosed.  Dist Graft lesion is 100% stenosed.  LV end diastolic pressure is mildly elevated.  The left ventricular systolic function is normal.  Hyperdynamic LV function with an EF of 65% without focal segmental wall motion abnormalities.  Native coronary obstructive disease with 40% ostial LAD stenosis, diffuse 40 to 50% proximal diagonal  stenosis with 80% LAD stenosis diffusely after the diagonal takeoff and evidence for competitive filling to the mid LAD via the LIMA graft; 25% ostial smooth narrowing in the ramus intermediate vessel; 25% ostial smooth narrowing in the left circumflex vessel; normal RCA.  Widely patent LIMA graft which supplies the mid LAD.  Totally ostial occlusion of the SVG which had supplied the ramus intermediate vessel.  Subtotally occluded ostial vein graft with diffuse 99% narrowing insistent with an atretic graft which does not fill the diagonal vessel.  RECOMMENDATION: Medical therapy. I suspect the early vein graft occlusion was contributed by improvement in the prior pre-CABG stenoses.  Diagnostic Diagram         Patient Profile     72 y.o. female transferred here for heart catheterization with unstable angina/non-ST elevation myocardial infarction with minimally elevated troponin in the setting of chest pain moderate in severity 10 minutes duration.  Assessment & Plan    Unstable angina/mild non-ST elevation myocardial infarction CAD/prior CABG/hyperlipidemia/hypertension - Subtotaled SVG to diagonal graft, other anatomy as above.  Continuing with medical management.  Minimally elevated low level troponin elevation.  Mild myocardial injury.  Troponin 0.18 -Restart metoprolol.  I will add Plavix 75 mg for the next year to optimize medical management. -Increase atorvastatin to 80 mg once a day.  Last LDL 56. - Cardiac rehabilitation, EF normal -Continue with diet, exercise.  If chest pain recurs, consider isosorbide.  Okay for follow-up with Dr. Harl Bowie.  Discharge.  For questions or updates, please contact [place] Please consult [email or web address] for contact info under Cardiology/STEMI.      Signed, Candee Furbish, MD

## 2017-11-03 NOTE — Progress Notes (Signed)
Site area: Right groin a 5 french arterial sheath was removed  Site Prior to Removal:  Level 0  Pressure Applied For 20 MINUTES    Bedrest Beginning at 0900am  Manual:   Yes.    Patient Status During Pull:  stable  Post Pull Groin Site:  Level 0  Post Pull Instructions Given:  Yes.    Post Pull Pulses Present:  Yes.    Dressing Applied:  Yes.    Comments:  VS remain stable 

## 2017-11-03 NOTE — Discharge Instructions (Signed)

## 2017-11-03 NOTE — Progress Notes (Addendum)
Progress Note  Patient Name: Gloria Mccoy Date of Encounter: 11/03/2017  Primary Cardiologist: Carlyle Dolly, MD   Subjective   Feels well post catheterization which led to medical management.  No chest pain, no shortness of breath.  Inpatient Medications    Scheduled Meds: . [START ON 11/04/2017] aspirin  81 mg Oral Daily  . atorvastatin  80 mg Oral q1800  . heparin  5,000 Units Subcutaneous Q8H  . sodium chloride flush  3 mL Intravenous Q12H   Continuous Infusions: . sodium chloride 150 mL/hr at 11/03/17 1011  . sodium chloride     PRN Meds: sodium chloride, acetaminophen, ondansetron (ZOFRAN) IV, sodium chloride flush   Vital Signs    Vitals:   11/03/17 0932 11/03/17 0947 11/03/17 1002 11/03/17 1018  BP: (!) 132/55 (!) 131/57 130/65 107/81  Pulse: (!) 52 (!) 50 (!) 58 70  Resp:      Temp:      TempSrc:      SpO2: 100% 100% 100% 100%  Weight:      Height:       No intake or output data in the 24 hours ending 11/03/17 1131 Filed Weights   11/02/17 0858 11/02/17 2122 11/03/17 0359  Weight: 195 lb (88.5 kg) 190 lb 11.2 oz (86.5 kg) 189 lb 11.2 oz (86 kg)    Telemetry    No adverse arrhythmias- Personally Reviewed  ECG    Sinus rhythm- Personally Reviewed  Physical Exam   GEN: No acute distress.  Overweight Neck: No JVD Cardiac: RRR, no murmurs, rubs, or gallops.  CABG scar Respiratory: Clear to auscultation bilaterally. GI: Soft, nontender, non-distended  MS: No edema; No deformity.  Authorization site intact Neuro:  Nonfocal  Psych: Normal affect   Labs    Chemistry Recent Labs  Lab 10/28/17 0927 11/02/17 0934 11/02/17 0935  NA  --  140  --   K  --  4.5  --   CL  --  102  --   CO2  --  29  --   GLUCOSE  --  110*  --   BUN  --  11  --   CREATININE  --  0.85  --   CALCIUM  --  8.9  --   PROT 8.1  --  7.9  ALBUMIN 3.8  --  3.7  AST 19  --  38  ALT 14  --  17  ALKPHOS 63  --  64  BILITOT 0.9  --  1.8*  GFRNONAA  --  >60  --     GFRAA  --  >60  --   ANIONGAP  --  9  --      Hematology Recent Labs  Lab 11/02/17 0934  WBC 9.3  RBC 4.77  HGB 13.1  HCT 38.2  MCV 80.1  MCH 27.5  MCHC 34.3  RDW 16.2*  PLT 199    Cardiac Enzymes Recent Labs  Lab 11/02/17 0934 11/02/17 1254  TROPONINI <0.03 0.18*   No results for input(s): TROPIPOC in the last 168 hours.   BNPNo results for input(s): BNP, PROBNP in the last 168 hours.   DDimer No results for input(s): DDIMER in the last 168 hours.   Radiology    Dg Chest 2 View  Result Date: 11/02/2017 CLINICAL DATA:  Central chest pain this morning while on the treadmill at cardiac rehab, shortness of breath, history coronary disease post three-vessel CABG in January 2019, hypertension EXAM: CHEST - 2 VIEW  COMPARISON:  08/23/2017 FINDINGS: Minimal enlargement of cardiac silhouette post CABG. Mediastinal contours and pulmonary vascularity normal. Mild bibasilar atelectasis. Lungs otherwise clear. No acute infiltrate, pleural effusion or pneumothorax. Bones unremarkable. IMPRESSION: Minimal enlargement of cardiac silhouette post CABG. Bibasilar atelectasis. Electronically Signed   By: Lavonia Dana M.D.   On: 11/02/2017 09:59    Cardiac Studies   Cardiac catheterization personally reviewed.   Ost 1st Diag to 1st Diag lesion is 40% stenosed.  Ost Cx lesion is 25% stenosed.  Ost Ramus lesion is 20% stenosed.  Ost LAD lesion is 45% stenosed.  Prox LAD to Mid LAD lesion is 80% stenosed.  Origin lesion is 100% stenosed.  Origin lesion is 99% stenosed.  Origin to Dist Graft lesion is 99% stenosed.  Dist Graft lesion is 100% stenosed.  LV end diastolic pressure is mildly elevated.  The left ventricular systolic function is normal.   Hyperdynamic LV function with an EF of 65% without focal segmental wall motion abnormalities.  Native coronary obstructive disease with 40% ostial LAD stenosis, diffuse 40 to 50% proximal diagonal stenosis with 80% LAD stenosis  diffusely after the diagonal takeoff and evidence for competitive filling to the mid LAD via the LIMA graft; 25% ostial smooth narrowing in the ramus intermediate vessel; 25% ostial smooth narrowing in the left circumflex vessel; normal RCA.  Widely patent LIMA graft which supplies the mid LAD.  Totally ostial occlusion of the SVG which had supplied the ramus intermediate vessel.  Subtotally occluded ostial vein graft with diffuse 99% narrowing insistent with an atretic graft  which does not fill the diagonal vessel.  RECOMMENDATION: Medical therapy.  I suspect the early vein graft occlusion was contributed by improvement in the prior pre-CABG stenoses.  Diagnostic Diagram        Patient Profile     72 y.o. female transferred here for heart catheterization with unstable angina/non-ST elevation myocardial infarction with minimally elevated troponin in the setting of chest pain moderate in severity 10 minutes duration.  Assessment & Plan    Unstable angina/mild non-ST elevation myocardial infarction CAD/prior CABG/hyperlipidemia/hypertension - Subtotaled SVG to diagonal graft, other anatomy as above.  Continuing with medical management.  Minimally elevated low level troponin elevation.  Mild myocardial injury.  Troponin 0.18 -Restart metoprolol.  I will add Plavix 75 mg for the next year to optimize medical management. -Increase atorvastatin to 80 mg once a day.  Last LDL 56. - Cardiac rehabilitation, EF normal -Continue with diet, exercise.  If chest pain recurs, consider isosorbide.  Okay for follow-up with Dr. Harl Bowie.  Discharge.  For questions or updates, please contact [place] Please consult [email or web address] for contact info under Cardiology/STEMI.      Signed, Candee Furbish, MD  11/03/2017, 11:31 AM

## 2017-11-03 NOTE — Interval H&P Note (Signed)
Cath Lab Visit (complete for each Cath Lab visit)  Clinical Evaluation Leading to the Procedure:   ACS: Yes.    Non-ACS:    Anginal Classification: CCS III  Anti-ischemic medical therapy: Minimal Therapy (1 class of medications)  Non-Invasive Test Results: No non-invasive testing performed  Prior CABG: Previous CABG      History and Physical Interval Note:  11/03/2017 7:33 AM  Gloria Mccoy  has presented today for surgery, with the diagnosis of cp  The various methods of treatment have been discussed with the patient and family. After consideration of risks, benefits and other options for treatment, the patient has consented to  Procedure(s): LEFT HEART CATH AND CORS/GRAFTS ANGIOGRAPHY (N/A) as a surgical intervention .  The patient's history has been reviewed, patient examined, no change in status, stable for surgery.  I have reviewed the patient's chart and labs.  Questions were answered to the patient's satisfaction.     Shelva Majestic

## 2017-11-03 NOTE — Progress Notes (Signed)
Bedrest complete. Ambulates to BR with stand by assist only. Post cath site care discussed with pt and family. Verbalized understanding. No s/s bleed or other complications at site.

## 2017-11-03 NOTE — Progress Notes (Signed)
Rt groin site with dressing C,D and I. Benign s/s bleed or hematoma. HOB elevated 30 degree. Tolerates well. Clear liquid tray provided. No further needs expressed at this time.

## 2017-11-04 ENCOUNTER — Encounter (HOSPITAL_COMMUNITY): Payer: Medicare HMO

## 2017-11-04 MED FILL — Heparin Sod (Porcine)-NaCl IV Soln 1000 Unit/500ML-0.9%: INTRAVENOUS | Qty: 1000 | Status: AC

## 2017-11-06 ENCOUNTER — Encounter: Payer: Self-pay | Admitting: Cardiology

## 2017-11-07 ENCOUNTER — Encounter (HOSPITAL_COMMUNITY): Payer: Medicare HMO

## 2017-11-07 ENCOUNTER — Telehealth: Payer: Self-pay | Admitting: *Deleted

## 2017-11-07 NOTE — Telephone Encounter (Signed)
-----   Message from Erma Heritage, [place] sent at 10/30/2017  8:24 AM EDT ----- Please let the patient know that her cholesterol levels have significantly improved as total cholesterol is down from 129 to 96 and LDL has declined from 86 to 56. At goal of LDL < 70. Liver function is within normal limits. Continue current medication regimen.

## 2017-11-07 NOTE — Telephone Encounter (Signed)
Called patient with test results. No answer. Left message to call back.  

## 2017-11-08 NOTE — Progress Notes (Signed)
Cardiac Individual Treatment Plan  Patient Details  Name: Gloria Mccoy MRN: [id number] Date of Birth: 1946/05/02 Referring Provider:     CARDIAC REHAB PHASE II ORIENTATION from 09/05/2017 in [place]  Referring Provider  Dr. Harl Bowie      Initial Encounter Date:    CARDIAC REHAB PHASE II ORIENTATION from 09/05/2017 in [place]  Date  09/05/17  Referring Provider  Dr. Harl Bowie      Visit Diagnosis: S/P CABG x 3  Patient's Home Medications on Admission:  Current Outpatient Medications:  .  acetaminophen (TYLENOL) 325 MG tablet, Take 2 tablets (650 mg total) by mouth every 6 (six) hours as needed for mild pain., Disp: , Rfl:  .  aspirin EC 81 MG tablet, Take 81 mg by mouth daily., Disp: , Rfl:  .  atorvastatin (LIPITOR) 80 MG tablet, Take 1 tablet (80 mg total) by mouth daily at 6 PM., Disp: 90 tablet, Rfl: 3 .  Cholecalciferol (VITAMIN D PO), Take 1 tablet by mouth daily. , Disp: , Rfl:  .  clopidogrel (PLAVIX) 75 MG tablet, Take 1 tablet (75 mg total) by mouth daily., Disp: 90 tablet, Rfl: 3 .  Cyanocobalamin (VITAMIN B-12 PO), Take 1 tablet by mouth daily., Disp: , Rfl:  .  fish oil-omega-3 fatty acids 1000 MG capsule, Take 1 g by mouth daily., Disp: , Rfl:  .  furosemide (LASIX) 40 MG tablet, Take 1 tablet (40 mg total) by mouth daily., Disp: 30 tablet, Rfl: 1 .  levothyroxine (SYNTHROID, LEVOTHROID) 75 MCG tablet, Take 75 mcg by mouth daily., Disp: , Rfl:  .  lisinopril (PRINIVIL,ZESTRIL) 2.5 MG tablet, Take 1 tablet (2.5 mg total) by mouth daily., Disp: 90 tablet, Rfl: 3 .  metoprolol tartrate (LOPRESSOR) 25 MG tablet, Take 1 tablet (25 mg total) by mouth 2 (two) times daily., Disp: 60 tablet, Rfl: 6 .  VITAMIN E PO, Take 1 tablet by mouth daily. , Disp: , Rfl:   Past Medical History: Past Medical History:  Diagnosis Date  . Arthritis    "hands sometimes" (07/13/2017)  . Coronary artery disease    a. s/p CABG x3 in 06/2017  with LIMA-LAD, SVG-D1, and SVG-RI.    Marland Kitchen Dips tobacco   . Essential hypertension   . Hypothyroid   . Meniere disease   . Obesity   . Osteopenia 01/2012   T score -1.3 FRAX 7.9%/0.6%    Tobacco Use: Social History   Tobacco Use  Smoking Status Never Smoker  Smokeless Tobacco Former Systems developer  . Types: Snuff    Labs: Recent Review Flowsheet Data    Labs for ITP Cardiac and Pulmonary Rehab Latest Ref Rng & Units 07/16/2017 07/16/2017 07/16/2017 07/17/2017 10/28/2017   Cholestrol 0 - 200 mg/dL - - - - 96   LDLCALC 0 - 99 mg/dL - - - - 56   HDL >40 mg/dL - - - - 33(L)   Trlycerides <150 mg/dL - - - - 36   Hemoglobin A1c 4.8 - 5.6 % - - - - -   PHART 7.350 - 7.450 7.325(L) 7.313(L) - - -   PCO2ART 32.0 - 48.0 mmHg 43.2 46.5 - - -   HCO3 20.0 - 28.0 mmol/L 22.2 23.3 - - -   TCO2 22 - 32 mmol/L 23 25 25 28  -   ACIDBASEDEF 0.0 - 2.0 mmol/L 3.0(H) 3.0(H) - - -   O2SAT % 97.0 99.0 - - -      Capillary Blood  Glucose: Lab Results  Component Value Date   GLUCAP 124 (H) 07/17/2017   GLUCAP 135 (H) 07/17/2017   GLUCAP 144 (H) 07/17/2017   GLUCAP 120 (H) 07/17/2017   GLUCAP 99 07/17/2017     Exercise Target Goals:    Exercise Program Goal: Individual exercise prescription set using results from initial 6 min walk test and THRR while considering  patient's activity barriers and safety.   Exercise Prescription Goal: Starting with aerobic activity 30 plus minutes a day, 3 days per week for initial exercise prescription. Provide home exercise prescription and guidelines that participant acknowledges understanding prior to discharge.  Activity Barriers & Risk Stratification: Activity Barriers & Cardiac Risk Stratification - 09/05/17 1018      Activity Barriers & Cardiac Risk Stratification   Activity Barriers  Deconditioning    Cardiac Risk Stratification  High       6 Minute Walk: 6 Minute Walk    Row Name 09/05/17 1018         6 Minute Walk   Phase  Initial     Distance   1050 feet     Distance % Change  0 %     Distance Feet Change  0 ft     Walk Time  6 minutes     # of Rest Breaks  0     MPH  1.98     METS  2.51     RPE  9     Perceived Dyspnea   9     VO2 Peak  7.06     Symptoms  No     Resting HR  73 bpm     Resting BP  110/70     Resting Oxygen Saturation   94 %     Exercise Oxygen Saturation  during 6 min walk  88 %     Max Ex. HR  107 bpm     Max Ex. BP  140/72     2 Minute Post BP  116/72        Oxygen Initial Assessment:   Oxygen Re-Evaluation:   Oxygen Discharge (Final Oxygen Re-Evaluation):   Initial Exercise Prescription: Initial Exercise Prescription - 09/05/17 1000      Date of Initial Exercise RX and Referring Provider   Date  09/05/17    Referring Provider  Dr. Harl Bowie      Treadmill   MPH  1.3    Grade  0    Minutes  15    METs  1.9      NuStep   Level  2    SPM  55    Minutes  20    METs  1.8      Prescription Details   Frequency (times per week)  3    Duration  Progress to 30 minutes of continuous aerobic without signs/symptoms of physical distress      Intensity   THRR 40-80% of Max Heartrate  [phone]    Ratings of Perceived Exertion  11-13    Perceived Dyspnea  0-4      Progression   Progression  Continue progressive overload as per policy without signs/symptoms or physical distress.      Resistance Training   Training Prescription  Yes    Weight  1    Reps  10-15       Perform Capillary Blood Glucose checks as needed.  Exercise Prescription Changes:  Exercise Prescription Changes    Row Name 09/15/17 1500 10/10/17  1400 10/25/17 0800 11/07/17 1400       Response to Exercise   Blood Pressure (Admit)  146/70  130/68  130/60  122/68    Blood Pressure (Exercise)  130/70  140/80  148/74  158/78    Blood Pressure (Exit)  138/60  120/56  110/58  140/68    Heart Rate (Admit)  70 bpm  101 bpm  71 bpm  63 bpm    Heart Rate (Exercise)  65 bpm  107 bpm  96 bpm  100 bpm    Heart Rate (Exit)   79 bpm  73 bpm  76 bpm  72 bpm    Rating of Perceived Exertion (Exercise)  10  10  10  10     Duration  Continue with 30 min of aerobic exercise without signs/symptoms of physical distress.  Continue with 30 min of aerobic exercise without signs/symptoms of physical distress.  Continue with 30 min of aerobic exercise without signs/symptoms of physical distress.  Continue with 30 min of aerobic exercise without signs/symptoms of physical distress.    Intensity  THRR New [id number]  THRR New 120-129-139  THRR New 102-117-133  THRR New [phone]      Progression   Progression  Continue to progress workloads to maintain intensity without signs/symptoms of physical distress.  Continue to progress workloads to maintain intensity without signs/symptoms of physical distress.  Continue to progress workloads to maintain intensity without signs/symptoms of physical distress.  Continue to progress workloads to maintain intensity without signs/symptoms of physical distress.      Resistance Training   Training Prescription  Yes  Yes  Yes  Yes    Weight  1  1  2  2     Reps  10-15  10-15  10-15  10-15      Treadmill   MPH  1.5  1.8  1.9  1.9    Grade  0  0  0  0    Minutes  15  15  15  15     METs  2.1  2.3  2.4  2.4      NuStep   Level  2  2  2  2     SPM  100  93  106  93    Minutes  20  20  20  20     METs  2.1  1.8  2.3  2.2       Exercise Comments:  Exercise Comments    Row Name 09/15/17 1538 10/10/17 1443 10/25/17 0818 11/07/17 1424     Exercise Comments  Patient is doing very well in CR and has only just started but has increased her speed on the treadmill and her SPMs on the nustep substantially. patient will continued to be monitored over the course of the program by myself and the staff   Patient continues to do very well in CR. Patient has increased her speed on the treadmill to 1.8. Progression on treadmill mahy be hindered due to patients lack of knowledge on how to operate the machine  independantly. Patient has also maintained her level on the nustep as well as her watts. Patient will continued to be monitored throughout the remainder of the program.   Patient is doing very well in CR and has increased her speed on teh treadmill to 1.9. Patient has also increased her average SPMs on the nustep machine. Patient has not complained of any aches or illnesses while being active in teh program. Patient has  shown evidence of increasing strength through her going up in weight for the warm up portion of the session.   Patient was doing well in CR. Since last progression patient has had a medical event and has not been attending CR. patient will return soon to the program but until then progression has ben halted.        Exercise Goals and Review:  Exercise Goals    Row Name 09/05/17 1019             Exercise Goals   Increase Physical Activity  Yes       Intervention  Provide advice, education, support and counseling about physical activity/exercise needs.;Develop an individualized exercise prescription for aerobic and resistive training based on initial evaluation findings, risk stratification, comorbidities and participant's personal goals.       Expected Outcomes  Short Term: Attend rehab on a regular basis to increase amount of physical activity.       Increase Strength and Stamina  Yes       Intervention  Develop an individualized exercise prescription for aerobic and resistive training based on initial evaluation findings, risk stratification, comorbidities and participant's personal goals.;Provide advice, education, support and counseling about physical activity/exercise needs.       Expected Outcomes  Short Term: Increase workloads from initial exercise prescription for resistance, speed, and METs.       Able to understand and use rate of perceived exertion (RPE) scale  Yes       Intervention  Provide education and explanation on how to use RPE scale       Expected Outcomes   Short Term: Able to use RPE daily in rehab to express subjective intensity level;Long Term:  Able to use RPE to guide intensity level when exercising independently       Able to understand and use Dyspnea scale  Yes       Intervention  Provide education and explanation on how to use Dyspnea scale       Expected Outcomes  Short Term: Able to use Dyspnea scale daily in rehab to express subjective sense of shortness of breath during exertion;Long Term: Able to use Dyspnea scale to guide intensity level when exercising independently       Knowledge and understanding of Target Heart Rate Range (THRR)  Yes       Intervention  Provide education and explanation of THRR including how the numbers were predicted and where they are located for reference       Expected Outcomes  Short Term: Able to state/look up THRR;Long Term: Able to use THRR to govern intensity when exercising independently;Short Term: Able to use daily as guideline for intensity in rehab       Able to check pulse independently  Yes       Intervention  Provide education and demonstration on how to check pulse in carotid and radial arteries.;Review the importance of being able to check your own pulse for safety during independent exercise       Expected Outcomes  Short Term: Able to explain why pulse checking is important during independent exercise;Long Term: Able to check pulse independently and accurately       Understanding of Exercise Prescription  Yes       Intervention  Provide education, explanation, and written materials on patient's individual exercise prescription       Expected Outcomes  Short Term: Able to explain program exercise prescription;Long Term: Able to explain home exercise prescription to  exercise independently          Exercise Goals Re-Evaluation : Exercise Goals Re-Evaluation    Row Name 09/15/17 1537 10/10/17 1441 11/07/17 1419         Exercise Goal Re-Evaluation   Exercise Goals Review  Increase Physical  Activity;Increase Strength and Stamina;Able to understand and use rate of perceived exertion (RPE) scale;Able to check pulse independently;Knowledge and understanding of Target Heart Rate Range (THRR);Able to understand and use Dyspnea scale;Understanding of Exercise Prescription  Increase Physical Activity;Increase Strength and Stamina;Able to understand and use rate of perceived exertion (RPE) scale;Able to check pulse independently;Knowledge and understanding of Target Heart Rate Range (THRR);Able to understand and use Dyspnea scale;Understanding of Exercise Prescription  Increase Physical Activity;Increase Strength and Stamina;Able to understand and use rate of perceived exertion (RPE) scale;Able to check pulse independently;Knowledge and understanding of Target Heart Rate Range (THRR);Able to understand and use Dyspnea scale;Understanding of Exercise Prescription     Comments  Patient is doing very well in CR and has only just started but has increased her speed on the treadmill and her SPMs on the nustep substantially. patient will continued to be monitored over the course of the program by myself and the staff   Patient continues to do very well in CR. Patient has increased her speed on the treadmill to 1.8. Progression on treadmill mahy be hindered due to patients lack of knowledge on how to operate the machine independantly. Patient has also maintained her level on the nustep as well as her watts. Patient will continued to be monitored throughout the remainder of the program.   Patient was doing well in CR. Since last progression patient has had a medical event and has not been attending CR. patient will return soon to the program but until then progression has ben halted.      Expected Outcomes  Patient wishes to get strength back and to lose weight  Patient wishes to get strength back and to lose weight  Patient wishes to get strength back and to lose weight         Discharge Exercise Prescription  (Final Exercise Prescription Changes): Exercise Prescription Changes - 11/07/17 1400      Response to Exercise   Blood Pressure (Admit)  122/68    Blood Pressure (Exercise)  158/78    Blood Pressure (Exit)  140/68    Heart Rate (Admit)  63 bpm    Heart Rate (Exercise)  100 bpm    Heart Rate (Exit)  72 bpm    Rating of Perceived Exertion (Exercise)  10    Duration  Continue with 30 min of aerobic exercise without signs/symptoms of physical distress.    Intensity  THRR New [phone]      Progression   Progression  Continue to progress workloads to maintain intensity without signs/symptoms of physical distress.      Resistance Training   Training Prescription  Yes    Weight  2    Reps  10-15      Treadmill   MPH  1.9    Grade  0    Minutes  15    METs  2.4      NuStep   Level  2    SPM  93    Minutes  20    METs  2.2       Nutrition:  Target Goals: Understanding of nutrition guidelines, daily intake of sodium 1500mg , cholesterol 200mg , calories 30% from fat and 7% or  less from saturated fats, daily to have 5 or more servings of fruits and vegetables.  Biometrics: Pre Biometrics - 09/05/17 1019      Pre Biometrics   Height  5\' 2"  (1.575 m)    Weight  192 lb 3.2 oz (87.2 kg)    Waist Circumference  40 inches    Hip Circumference  41 inches    Waist to Hip Ratio  0.98 %    BMI (Calculated)  35.14    Triceps Skinfold  20 mm    % Body Fat  43.6 %    Grip Strength  49.4 kg    Flexibility  11.33 in    Single Leg Stand  3 seconds        Nutrition Therapy Plan and Nutrition Goals: Nutrition Therapy & Goals - 11/08/17 1243      Nutrition Therapy   RD appointment deferred  Yes      Personal Nutrition Goals   Personal Goal #2  Patient continues to say she is eating a heart healthy diet.     Additional Goals?  No    Comments  Patient continues to say she is eating heart healthy.  Will continue to monitor.        Nutrition Assessments: Nutrition Assessments -  09/05/17 1116      MEDFICTS Scores   Pre Score  9       Nutrition Goals Re-Evaluation:   Nutrition Goals Discharge (Final Nutrition Goals Re-Evaluation):   Psychosocial: Target Goals: Acknowledge presence or absence of significant depression and/or stress, maximize coping skills, provide positive support system. Participant is able to verbalize types and ability to use techniques and skills needed for reducing stress and depression.  Initial Review & Psychosocial Screening: Initial Psych Review & Screening - 09/05/17 1120      Initial Review   Current issues with  Current Sleep Concerns      Family Dynamics   Good Support System?  Yes      Barriers   Psychosocial barriers to participate in program  Psychosocial barriers identified (see note)      Screening Interventions   Interventions  Encouraged to exercise       Quality of Life Scores: Quality of Life - 09/05/17 1021      Quality of Life Scores   Health/Function Pre  19.97 %    Socioeconomic Pre  17.5 %    Psych/Spiritual Pre  20 %    Family Pre  17.8 %    GLOBAL Pre  19.15 %      Scores of 19 and below usually indicate a poorer quality of life in these areas.  A difference of  2-3 points is a clinically meaningful difference.  A difference of 2-3 points in the total score of the Quality of Life Index has been associated with significant improvement in overall quality of life, self-image, physical symptoms, and general health in studies assessing change in quality of life.  PHQ-9: Recent Review Flowsheet Data    Depression screen [facility] 2/9 09/05/2017   Decreased Interest 0   Down, Depressed, Hopeless 0   PHQ - 2 Score 0   Altered sleeping 2   Tired, decreased energy 2   Change in appetite 0   Feeling bad or failure about yourself  0   Trouble concentrating 0   Moving slowly or fidgety/restless 0   Suicidal thoughts 0   PHQ-9 Score 4   Difficult doing work/chores Not difficult at all  Interpretation of  Total Score  Total Score Depression Severity:  1-4 = Minimal depression, 5-9 = Mild depression, 10-14 = Moderate depression, 15-19 = Moderately severe depression, 20-27 = Severe depression   Psychosocial Evaluation and Intervention: Psychosocial Evaluation - 09/05/17 1121      Psychosocial Evaluation & Interventions   Interventions  Encouraged to exercise with the program and follow exercise prescription    Continue Psychosocial Services   Follow up required by staff       Psychosocial Re-Evaluation: Psychosocial Re-Evaluation    [place] Name 09/22/17 0943 10/19/17 1409 11/08/17 1251         Psychosocial Re-Evaluation   Current issues with  Current Sleep Concerns  Current Sleep Concerns  Current Sleep Concerns     Comments  Patient's initial QOL was 19.15 and her PHQ-9 was 4 with current sleep issues.   Patient's initial QOL was 19.15 and her PHQ-9 was 4 with current sleep issues.   Patient's initial QOL was 19.15 and her PHQ-9 was 4 with current sleep issues.      Expected Outcomes  Patient will have no additional psychosocial issues identified at discharge and her sleep issues will be managed.   Patient will have no additional psychosocial issues identified at discharge and her sleep issues will be managed.   Patient will have no additional psychosocial issues identified at discharge and her sleep issues will be managed.      Interventions  Stress management education;Encouraged to attend Cardiac Rehabilitation for the exercise;Relaxation education  Stress management education;Encouraged to attend Cardiac Rehabilitation for the exercise;Relaxation education  Stress management education;Encouraged to attend Cardiac Rehabilitation for the exercise;Relaxation education     Continue Psychosocial Services   No Follow up required  No Follow up required  No Follow up required        Psychosocial Discharge (Final Psychosocial Re-Evaluation): Psychosocial Re-Evaluation - 11/08/17 1251       Psychosocial Re-Evaluation   Current issues with  Current Sleep Concerns    Comments  Patient's initial QOL was 19.15 and her PHQ-9 was 4 with current sleep issues.     Expected Outcomes  Patient will have no additional psychosocial issues identified at discharge and her sleep issues will be managed.     Interventions  Stress management education;Encouraged to attend Cardiac Rehabilitation for the exercise;Relaxation education    Continue Psychosocial Services   No Follow up required       Vocational Rehabilitation: Provide vocational rehab assistance to qualifying candidates.   Vocational Rehab Evaluation & Intervention: Vocational Rehab - 09/05/17 1113      Initial Vocational Rehab Evaluation & Intervention   Assessment shows need for Vocational Rehabilitation  No       Education: Education Goals: Education classes will be provided on a weekly basis, covering required topics. Participant will state understanding/return demonstration of topics presented.  Learning Barriers/Preferences: Learning Barriers/Preferences - 09/05/17 1056      Learning Barriers/Preferences   Learning Barriers  Hearing    Learning Preferences  Written Material;Skilled Demonstration;Pictoral;Group Instruction       Education Topics: Hypertension, Hypertension Reduction -Define heart disease and high blood pressure. Discus how high blood pressure affects the body and ways to reduce high blood pressure.   CARDIAC REHAB PHASE II EXERCISE from 10/26/2017 in [place]  Date  10/26/17  Educator  Russella Dar  Instruction Review Code  2- Demonstrated Understanding      Exercise and Your Heart -Discuss why it is important to  exercise, the FITT principles of exercise, normal and abnormal responses to exercise, and how to exercise safely.   Angina -Discuss definition of angina, causes of angina, treatment of angina, and how to decrease risk of having angina.   Cardiac  Medications -Review what the following cardiac medications are used for, how they affect the body, and side effects that may occur when taking the medications.  Medications include Aspirin, Beta blockers, calcium channel blockers, ACE Inhibitors, angiotensin receptor blockers, diuretics, digoxin, and antihyperlipidemics.   Congestive Heart Failure -Discuss the definition of CHF, how to live with CHF, the signs and symptoms of CHF, and how keep track of weight and sodium intake.   Heart Disease and Intimacy -Discus the effect sexual activity has on the heart, how changes occur during intimacy as we age, and safety during sexual activity.   Smoking Cessation / COPD -Discuss different methods to quit smoking, the health benefits of quitting smoking, and the definition of COPD.   CARDIAC REHAB PHASE II EXERCISE from 10/26/2017 in [place]  Date  09/08/17  Educator  DC  Instruction Review Code  2- Demonstrated Understanding      Nutrition I: Fats -Discuss the types of cholesterol, what cholesterol does to the heart, and how cholesterol levels can be controlled.   CARDIAC REHAB PHASE II EXERCISE from 10/26/2017 in [place]  Date  09/14/17  Educator  DC  Instruction Review Code  2- Demonstrated Understanding      Nutrition II: Labels -Discuss the different components of food labels and how to read food label   [place] from 10/26/2017 in [place]  Date  09/21/17  Educator  DJ  Instruction Review Code  2- Demonstrated Understanding      Heart Parts/Heart Disease and PAD -Discuss the anatomy of the heart, the pathway of blood circulation through the heart, and these are affected by heart disease.   CARDIAC REHAB PHASE II EXERCISE from 10/26/2017 in [place]  Date  09/28/17  Educator  DJ  Instruction Review Code  2- Demonstrated Understanding      Stress I: Signs  and Symptoms -Discuss the causes of stress, how stress may lead to anxiety and depression, and ways to limit stress.   CARDIAC REHAB PHASE II EXERCISE from 10/26/2017 in [place]  Date  10/05/17  Educator  [place]  Instruction Review Code  2- Demonstrated Understanding      Stress II: Relaxation -Discuss different types of relaxation techniques to limit stress.   CARDIAC REHAB PHASE II EXERCISE from 10/26/2017 in [place]  Date  10/12/17  Educator  DJ  Instruction Review Code  2- Demonstrated Understanding      Warning Signs of Stroke / TIA -Discuss definition of a stroke, what the signs and symptoms are of a stroke, and how to identify when someone is having stroke.   CARDIAC REHAB PHASE II EXERCISE from 10/26/2017 in [place]  Date  10/19/17  Educator  DC  Instruction Review Code  2- Demonstrated Understanding      Knowledge Questionnaire Score: Knowledge Questionnaire Score - 09/05/17 1111      Knowledge Questionnaire Score   Pre Score  20/24       Core Components/Risk Factors/Patient Goals at Admission: Personal Goals and Risk Factors at Admission - 09/05/17 1116      Core Components/Risk Factors/Patient Goals on Admission    Weight Management  Yes    Intervention  Weight Management/Obesity: Establish reasonable short term and long term weight goals.;Obesity: Provide education and appropriate resources to help participant work on and attain dietary goals.    Admit Weight  192 lb 3.2 oz (87.2 kg)    Goal Weight: Short Term  183 lb 6.4 oz (83.2 kg)    Goal Weight: Long Term  174 lb 11.2 oz (79.2 kg)    Personal Goal Other  Yes    Personal Goal  Gain strength, Lose 17 pounds, get physically active again.     Intervention  Attend CR 3 x week and supplement with exercise at home 2 x week.     Expected Outcomes  To reach personal goals.        Core Components/Risk Factors/Patient Goals Review:  Goals and  Risk Factor Review    Row Name 09/22/17 0940 10/19/17 1407 11/08/17 1243         Core Components/Risk Factors/Patient Goals Review   Personal Goals Review  Weight Management/Obesity Get strength back; lose 17 lbs; get physically active.  Weight Management/Obesity Get strenght back; lose 17 lbs; get physically active.   Weight Management/Obesity Get strength back; lose 17 lbs; get physically active.      Review  Patient has completed 8 sessions maintaining her weight. She is doing well in the program with some progression. She says it is too soon to tell if the program is making a difference. She hope to meet her goals as the continues the program. Will continue to monitor for progress.   Patient has completed 20 sessions losing 1.5 lbs since she started the program. She continues to do well in the program with progression. She says she does feel stronger and has more energy but is not where she wants to be. She hopes she will improve more as she continues to attend sessions. Will continue to monitor for progress.   Patient has completed 26 sessions gaining 1 lb since last 30 day review. She complained of having tightness in her chest while walking on the treadmill at her last session 11/02/17. She was sent to the ED where her 2nd troponin level was elevated. She was diagnosed with a NSTEMI and was transported to [facility] for a heart cath which did not show any additional blockages. She was started on Plavix 75 mg for a least the next year and her Atrovastatin was increased. She was instructed to rest and return to [facility] Wednesday 11/16/16. Prior to this event, she was doing well in the program with progression. She was able to pull her trashcan up her driveway for the first time since her surgery with minimal SOB and fatigue. She said she was able to do her house work with less fatigue. Will contiunue to monitor for progress.      Expected Outcomes  Patient will continue to attend sessions and complete the program.    Patient will continue to attend sessions and complete the program.   Patient will continue to attend sessions and complete the program.         Core Components/Risk Factors/Patient Goals at Discharge (Final Review):  Goals and Risk Factor Review - 11/08/17 1243      Core Components/Risk Factors/Patient Goals Review   Personal Goals Review  Weight Management/Obesity Get strength back; lose 17 lbs; get physically active.     Review  Patient has completed 26 sessions gaining 1 lb since last 30 day review. She complained of having tightness in her  chest while walking on the treadmill at her last session 11/02/17. She was sent to the ED where her 2nd troponin level was elevated. She was diagnosed with a NSTEMI and was transported to [facility] for a heart cath which did not show any additional blockages. She was started on Plavix 75 mg for a least the next year and her Atrovastatin was increased. She was instructed to rest and return to [facility] Wednesday 11/16/16. Prior to this event, she was doing well in the program with progression. She was able to pull her trashcan up her driveway for the first time since her surgery with minimal SOB and fatigue. She said she was able to do her house work with less fatigue. Will contiunue to monitor for progress.     Expected Outcomes  Patient will continue to attend sessions and complete the program.        ITP Comments: ITP Comments    Row Name 09/05/17 1114           ITP Comments  Mrs. Broecker is a pleasant 31 yearld female. She has just had CABGx3. She is feeling great. Still a little sore where the incision site is. Other than that she is doing well and is eager to get started.           Comments: ITP 30 Day REVIEW Patient doing well in the program. Will continue to monitor for progress.

## 2017-11-09 ENCOUNTER — Encounter (HOSPITAL_COMMUNITY): Payer: Medicare HMO

## 2017-11-11 ENCOUNTER — Encounter (HOSPITAL_COMMUNITY): Payer: Medicare HMO

## 2017-11-14 ENCOUNTER — Encounter (HOSPITAL_COMMUNITY): Payer: Medicare HMO

## 2017-11-16 ENCOUNTER — Telehealth: Payer: Self-pay | Admitting: Cardiology

## 2017-11-16 ENCOUNTER — Encounter (HOSPITAL_COMMUNITY)
Admission: RE | Admit: 2017-11-16 | Discharge: 2017-11-16 | Disposition: A | Discharge status: Home / Self Care | Payer: Medicare HMO | Source: Ambulatory Visit | Attending: Cardiology | Admitting: Cardiology

## 2017-11-16 DIAGNOSIS — Z951 Presence of aortocoronary bypass graft: Secondary | ICD-10-CM | POA: Diagnosis not present

## 2017-11-16 NOTE — Progress Notes (Signed)
Daily Session Note  Patient Details  Name: Gloria Mccoy MRN: [id number] Date of Birth: 1945-10-06 Referring Provider:     CARDIAC REHAB PHASE II ORIENTATION from 09/05/2017 in [place]  Referring Provider  Dr. Harl Bowie      Encounter Date: 11/16/2017  Check In: Session Check In - 11/16/17 0800      Check-In   Location  [facility]    Staff Present  Russella Dar, MS, EP, [facility], Exercise Physiologist;Gregory Luther Parody, BS, EP, Exercise Physiologist    Supervising physician immediately available to respond to emergencies  See telemetry face sheet for immediately available MD    Medication changes reported      No    Fall or balance concerns reported     No    Tobacco Cessation  No Change    Warm-up and Cool-down  Performed as group-led instruction    Resistance Training Performed  Yes    VAD Patient?  No      Pain Assessment   Currently in Pain?  No/denies    Pain Score  0-No pain    Multiple Pain Sites  No       Capillary Blood Glucose: No results found for this or any previous visit (from the past 24 hour(s)).    Social History   Tobacco Use  Smoking Status Never Smoker  Smokeless Tobacco Former Systems developer  . Types: Snuff    Goals Met:  Independence with exercise equipment Exercise tolerated well No report of cardiac concerns or symptoms Strength training completed today  Goals Unmet:  Not Applicable  Comments: Check out: 0915   Dr. Kate Sable is Medical Director for [facility] and Pulmonary Rehab.

## 2017-11-16 NOTE — Telephone Encounter (Signed)
Pt has lab work pending to be done, she had a heart attack while in cardiac rehab 1wk-10 days ago. Her daughter is wanting to know if she needs to have any other labs done to go along with the ones that are pending?

## 2017-11-16 NOTE — Telephone Encounter (Signed)
Called to inform pt that there is no new labs that need to be done. Only the ones ordered at [facility] with Dr. Harl Bowie.

## 2017-11-17 NOTE — Progress Notes (Signed)
Cardiology Office Note    Date:  11/18/2017   ID:  Gloria Mccoy, DOB [birth year], MRN [id number]  PCP:  Gloria School, MD  Cardiologist: Gloria Dolly, MD    Chief Complaint  Patient presents with  . Hospitalization Follow-up    History of Present Illness:    Gloria Mccoy is a 72 y.o. female with past medical history of CAD (s/p CABG in 06/2017 with LIMA-LAD, SVG-D1, and SVG-RI), HTN, HLD, Hypothyroidism and tobacco use (smokeless tobacco) who presents to the office today for hospital follow-up.   She was evaluated by Dr. Harl Mccoy on 10/31/2017 and reported overall doing well at that time, denying any recent chest pain or dyspnea on exertion. Two days later while at cardiac rehab, she developed chest pain with associated dyspnea and was taken to [facility] ED for further evaluation. Initial troponin but negative but her second value was elevated to 0.18. She was therefore started on Heparin and transferred to [facility] for a cardiac catheterization. This was performed on 11/03/2017 and showed a widely patent LIMA-LAD with ostial occlusion of the SVG-RI and subtotally occluded atretic SVG-D1. Continued medical therapy was recommended as it was thought her early vein graft occlusion was secondary to improvement in her pre-CABG stenoses. She was continued on ASA and statin therapy with Plavix being added to her medication regimen to optimize medical therapy.   In talking with the patient today, she reports overall doing well since hospital discharge. She has returned to cardiac rehab and has been participating in activities without any recurrent anginal symptoms. She does experience an occasional "twinge" of discomfort that radiates along her sternum but this resolves within a few seconds and is not associated with exertion. Denies any recent dyspnea on exertion, orthopnea, PND, lower extremity edema, or palpitations.  She reports good compliance with her medication regimen including  ASA and Plavix, denying any evidence of active bleeding.  Past Medical History:  Diagnosis Date  . Arthritis    "hands sometimes" (07/13/2017)  . Coronary artery disease    a. s/p CABG x3 in 06/2017 with LIMA-LAD, SVG-D1, and SVG-RI.  b. 10/2017: cath showing a widely patent LIMA-LAD with ostial occlusion of the SVG-RI and subtotally occluded atretic SVG-D1. Graft occlusion thought to be 2ry to improvement in pre-CABG stenoses.   . Dips tobacco   . Essential hypertension   . Hypothyroid   . Meniere disease   . Obesity   . Osteopenia 01/2012   T score -1.3 FRAX 7.9%/0.6%    Past Surgical History:  Procedure Laterality Date  . CARDIAC CATHETERIZATION  07/13/2017  . CATARACT EXTRACTION W/PHACO Right 01/28/2015   Procedure: CATARACT EXTRACTION PHACO AND INTRAOCULAR LENS PLACEMENT :  CDE:  5.70;  Surgeon: Rutherford Guys, MD;  Location: [facility];  Service: Ophthalmology;  Laterality: Right;  . CATARACT EXTRACTION W/PHACO Left 02/11/2015   Procedure: CATARACT EXTRACTION PHACO AND INTRAOCULAR LENS PLACEMENT (IOC);  Surgeon: Rutherford Guys, MD;  Location: [facility];  Service: Ophthalmology;  Laterality: Left;  CDE: 7.38  . COLONOSCOPY N/A 09/26/2012   Procedure: COLONOSCOPY;  Surgeon: Gloria So, MD;  Location: [facility];  Service: Gastroenterology;  Laterality: N/A;  . CORONARY ARTERY BYPASS GRAFT N/A 07/15/2017   Procedure: CORONARY ARTERY BYPASS GRAFTING (CABG) X 3 USING LEFT INTERNAL MAMMARY ARTERY AND RIGHT SAPHENOUS VEIN- ENDOSCOPICALLY HARVESTED;  Surgeon: Gloria Nakayama, MD;  Location: [place];  Service: Open Heart Surgery;  Laterality: N/A;  . LEFT HEART CATH AND CORONARY ANGIOGRAPHY  N/A 07/13/2017   Procedure: LEFT HEART CATH AND CORONARY ANGIOGRAPHY;  Surgeon: Gloria, Peter M, MD;  Location: [place] CV [facility];  Service: Cardiovascular;  Laterality: N/A;  . LEFT HEART CATH AND CORS/GRAFTS ANGIOGRAPHY N/A 11/03/2017   Procedure: LEFT HEART CATH AND CORS/GRAFTS ANGIOGRAPHY;  Surgeon: Gloria Sine, MD;  Location: [place] CV LAB;  Service: Cardiovascular;  Laterality: N/A;  . TEE WITHOUT CARDIOVERSION N/A 07/15/2017   Procedure: TRANSESOPHAGEAL ECHOCARDIOGRAM (TEE);  Surgeon: Gloria Nakayama, MD;  Location: [place];  Service: Open Heart Surgery;  Laterality: N/A;  . TUBAL LIGATION    . TYMPANOPLASTY Left    fluid from ear drum    Current Medications: Outpatient Medications Prior to Visit  Medication Sig Dispense Refill  . acetaminophen (TYLENOL) 325 MG tablet Take 2 tablets (650 mg total) by mouth every 6 (six) hours as needed for mild pain.    Marland Kitchen aspirin EC 81 MG tablet Take 81 mg by mouth daily.    Marland Kitchen atorvastatin (LIPITOR) 80 MG tablet Take 1 tablet (80 mg total) by mouth daily at 6 PM. 90 tablet 3  . Cholecalciferol (VITAMIN D PO) Take 1 tablet by mouth daily.     . clopidogrel (PLAVIX) 75 MG tablet Take 1 tablet (75 mg total) by mouth daily. 90 tablet 3  . Cyanocobalamin (VITAMIN B-12 PO) Take 1 tablet by mouth daily.    . fish oil-omega-3 fatty acids 1000 MG capsule Take 1 g by mouth daily.    . furosemide (LASIX) 40 MG tablet Take 1 tablet (40 mg total) by mouth daily. 30 tablet 1  . levothyroxine (SYNTHROID, LEVOTHROID) 75 MCG tablet Take 75 mcg by mouth daily.    Marland Kitchen lisinopril (PRINIVIL,ZESTRIL) 2.5 MG tablet Take 1 tablet (2.5 mg total) by mouth daily. 90 tablet 3  . metoprolol tartrate (LOPRESSOR) 25 MG tablet Take 1 tablet (25 mg total) by mouth 2 (two) times daily. 60 tablet 6  . VITAMIN E PO Take 1 tablet by mouth daily.      No facility-administered medications prior to visit.      Allergies:   Sulfa antibiotics and Sulfasalazine   Social History   Socioeconomic History  . Marital status: Married    Spouse name: Not on file  . Number of children: Not on file  . Years of education: Not on file  . Highest education level: Not on file  Occupational History  . Occupation: Retired  Scientific laboratory technician  . Financial resource strain: Not on file  . Food  insecurity:    Worry: Not on file    Inability: Not on file  . Transportation needs:    Medical: Not on file    Non-medical: Not on file  Tobacco Use  . Smoking status: Never Smoker  . Smokeless tobacco: Former Systems developer    Types: Snuff  Substance and Sexual Activity  . Alcohol use: Yes    Alcohol/week: 0.6 oz    Types: 1 Standard drinks or equivalent per week  . Drug use: No  . Sexual activity: Yes    Birth control/protection: Surgical  Lifestyle  . Physical activity:    Days per week: Not on file    Minutes per session: Not on file  . Stress: Not on file  Relationships  . Social connections:    Talks on phone: Not on file    Gets together: Not on file    Attends religious service: Not on file    Active member of  club or organization: Not on file    Attends meetings of clubs or organizations: Not on file    Relationship status: Not on file  Other Topics Concern  . Not on file  Social History Narrative  . Not on file     Family History:  The patient's family history includes Breast cancer in her cousin; Cancer (age of onset: 73) in her maternal uncle; Diabetes in her sister and sister; Heart attack in her father; Heart disease (age of onset: 5) in her sister; Heart disease (age of onset: 24) in her brother; Hypertension in her daughter and daughter.   Review of Systems:   Please see the history of present illness.     General:  No chills, fever, night sweats or weight changes.  Cardiovascular:  No dyspnea on exertion, edema, orthopnea, palpitations, paroxysmal nocturnal dyspnea. Positive for chest pain.  Dermatological: No rash, lesions/masses Respiratory: No cough, dyspnea Urologic: No hematuria, dysuria Abdominal:   No nausea, vomiting, diarrhea, bright red blood per rectum, melena, or hematemesis Neurologic:  No visual changes, wkns, changes in mental status. All other systems reviewed and are otherwise negative except as noted above.   Physical Exam:    VS:  BP  136/66   Pulse 65   Ht 5\' 2"  (1.575 Mccoy)   Wt 196 lb 3.2 oz (89 kg)   SpO2 93% Comment: on room air  BMI 35.89 kg/Mccoy    General: Well developed, well nourished,female appearing in no acute distress. Head: Normocephalic, atraumatic, sclera non-icteric, no xanthomas, nares are without discharge.  Neck: No carotid bruits. JVD not elevated.  Lungs: Respirations regular and unlabored, without wheezes or rales.  Heart: Regular rate and rhythm. No S3 or S4.  No murmur, no rubs, or gallops appreciated. Abdomen: Soft, non-tender, non-distended with normoactive bowel sounds. No hepatomegaly. No rebound/guarding. No obvious abdominal masses. Msk:  Strength and tone appear normal for age. No joint deformities or effusions. Extremities: No clubbing or cyanosis. No lower extremity edema.  Distal pedal pulses are 2+ bilaterally. Neuro: Alert and oriented X 3. Moves all extremities spontaneously. No focal deficits noted. Psych:  Responds to questions appropriately with a normal affect. Skin: No rashes or lesions noted  Wt Readings from Last 3 Encounters:  11/18/17 196 lb 3.2 oz (89 kg)  11/03/17 189 lb 11.2 oz (86 kg)  10/31/17 193 lb 6.4 oz (87.7 kg)     Studies/Labs Reviewed:   EKG:  EKG is not ordered today.   Recent Labs: 07/13/2017: B Natriuretic Peptide 55.0; TSH 3.780 07/16/2017: Magnesium 2.5 11/02/2017: BUN 11; Creatinine, Ser 0.85; Hemoglobin 13.1; Platelets 199; Potassium 4.5; Sodium 140 11/18/2017: ALT 14   Lipid Panel    Component Value Date/Time   CHOL 79 11/18/2017 0935   TRIG 52 11/18/2017 0935   HDL 25 (L) 11/18/2017 0935   CHOLHDL 3.2 11/18/2017 0935   VLDL 10 11/18/2017 0935   LDLCALC 44 11/18/2017 0935    Additional studies/ records that were reviewed today include:   Cardiac Catheterization: 11/03/2017  Ost 1st Diag to 1st Diag lesion is 40% stenosed.  Ost Cx lesion is 25% stenosed.  Ost Ramus lesion is 20% stenosed.  Ost LAD lesion is 45% stenosed.  Prox LAD to  Mid LAD lesion is 80% stenosed.  Origin lesion is 100% stenosed.  Origin lesion is 99% stenosed.  Origin to Dist Graft lesion is 99% stenosed.  Dist Graft lesion is 100% stenosed.  LV end diastolic pressure is mildly elevated.  The left ventricular systolic function is normal.   Hyperdynamic LV function with an EF of 65% without focal segmental wall motion abnormalities.  Native coronary obstructive disease with 40% ostial LAD stenosis, diffuse 40 to 50% proximal diagonal stenosis with 80% LAD stenosis diffusely after the diagonal takeoff and evidence for competitive filling to the mid LAD via the LIMA graft; 25% ostial smooth narrowing in the ramus intermediate vessel; 25% ostial smooth narrowing in the left circumflex vessel; normal RCA.  Widely patent LIMA graft which supplies the mid LAD.  Totally ostial occlusion of the SVG which had supplied the ramus intermediate vessel.  Subtotally occluded ostial vein graft with diffuse 99% narrowing insistent with an atretic graft  which does not fill the diagonal vessel.  RECOMMENDATION: Medical therapy.  I suspect the early vein graft occlusion was contributed by improvement in the prior pre-CABG stenoses.   Assessment:    1. Coronary artery disease involving native coronary artery of native heart without angina pectoris   2. Hx of CABG   3. Essential hypertension   4. Mixed hyperlipidemia      Plan:   In order of problems listed above:  1. CAD - s/p CABG in 06/2017 with LIMA-LAD, SVG-D1, and SVG-RI. She was recently admitted for an NSTEMI and cardiac catheterization showed a widely patent LIMA-LAD with ostial occlusion of the SVG-RI and subtotally occluded atretic SVG-D1. Continued medical therapy was recommended as it was thought her early vein graft occlusion was secondary to improvement in her pre-CABG stenoses.  - she denies any repeat episodes of exertional pain since hospital discharge. She does experience "twinges"  of pain lasting for only a few seconds at a time but these overall seem atypical for a cardiac etiology. Could try a trial of Imdur if these worsen (discussed with the patient today and she does not want to be on additional medications at this time but will let us know if her symptoms worsen).  - continue ASA, Plavix (for 12 months), BB, and statin therapy.   2. HTN - BP is well controlled at 136/66 during today's visit. Continue Lisinopril 2.5 mg daily and Lopressor 25 mg twice daily. BP is being followed closely at cardiac rehab. If this starts to trend upwards, could further increase Lisinopril to 5 mg daily.  3. HLD - Repeat FLP was obtained this morning and showed total cholesterol at 79, triglycerides 52, HDL 25, and LDL 44.  At goal of LDL less than 70. - Continue Atorvastatin 80 mg daily and Fish Oil.   Medication Adjustments/Labs and Tests Ordered: Current medicines are reviewed at length with the patient today.  Concerns regarding medicines are outlined above.  Medication changes, Labs and Tests ordered today are listed in the Patient Instructions below. Patient Instructions  Medication Instructions:  Your physician recommends that you continue on your current medications as directed. Please refer to the Current Medication list given to you today.  Labwork: NONE   Testing/Procedures: NONE  Follow-Up: Your physician recommends that you schedule a follow-up appointment in: 4 Months   Any Other Special Instructions Will Be Listed Below (If Applicable).  If you need a refill on your cardiac medications before your next appointment, please call your pharmacy.  Thank you for choosing [facility]!    Signed, Erma Heritage, PA-C  11/18/2017 4:45 PM    [facility] [address] [place], [place] [address] Phone: [phone]

## 2017-11-18 ENCOUNTER — Ambulatory Visit: Payer: Medicare HMO | Admitting: Student

## 2017-11-18 ENCOUNTER — Encounter: Payer: Self-pay | Admitting: Student

## 2017-11-18 ENCOUNTER — Other Ambulatory Visit (HOSPITAL_COMMUNITY)
Admission: RE | Admit: 2017-11-18 | Discharge: 2017-11-18 | Disposition: A | Discharge status: Home / Self Care | Payer: Medicare HMO | Source: Ambulatory Visit | Attending: Student | Admitting: Student

## 2017-11-18 ENCOUNTER — Encounter (HOSPITAL_COMMUNITY)
Admission: RE | Admit: 2017-11-18 | Discharge: 2017-11-18 | Disposition: A | Discharge status: Home / Self Care | Payer: Medicare HMO | Source: Ambulatory Visit | Attending: Cardiology | Admitting: Cardiology

## 2017-11-18 VITALS — BP 136/66 | HR 65 | Ht 62.0 in | Wt 196.2 lb

## 2017-11-18 DIAGNOSIS — I10 Essential (primary) hypertension: Secondary | ICD-10-CM

## 2017-11-18 DIAGNOSIS — Z951 Presence of aortocoronary bypass graft: Secondary | ICD-10-CM

## 2017-11-18 DIAGNOSIS — E782 Mixed hyperlipidemia: Secondary | ICD-10-CM | POA: Diagnosis not present

## 2017-11-18 DIAGNOSIS — I2510 Atherosclerotic heart disease of native coronary artery without angina pectoris: Secondary | ICD-10-CM | POA: Insufficient documentation

## 2017-11-18 DIAGNOSIS — Z79899 Other long term (current) drug therapy: Secondary | ICD-10-CM | POA: Diagnosis not present

## 2017-11-18 LAB — HEPATIC FUNCTION PANEL
ALBUMIN: 3.6 g/dL (ref 3.5–5.0)
ALT: 14 U/L (ref 14–54)
AST: 18 U/L (ref 15–41)
Alkaline Phosphatase: 65 U/L (ref 38–126)
BILIRUBIN DIRECT: 0.1 mg/dL (ref 0.1–0.5)
BILIRUBIN TOTAL: 0.7 mg/dL (ref 0.3–1.2)
Indirect Bilirubin: 0.6 mg/dL (ref 0.3–0.9)
Total Protein: 7.6 g/dL (ref 6.5–8.1)

## 2017-11-18 LAB — LIPID PANEL
CHOLESTEROL: 79 mg/dL (ref 0–200)
HDL: 25 mg/dL — ABNORMAL LOW (ref >40)
LDL Cholesterol: 44 mg/dL (ref 0–99)
Total CHOL/HDL Ratio: 3.2 RATIO
Triglycerides: 52 mg/dL (ref <150)
VLDL: 10 mg/dL (ref 0–40)

## 2017-11-18 NOTE — Progress Notes (Signed)
Daily Session Note  Patient Details  Name: Gloria Mccoy MRN: [id number] Date of Birth: 04-11-1946 Referring Provider:     CARDIAC REHAB PHASE II ORIENTATION from 09/05/2017 in [place]  Referring Provider  Dr. Harl Bowie      Encounter Date: 11/18/2017  Check In: Session Check In - 11/18/17 0815      Check-In   Location  [facility]    Staff Present  Suzanne Boron, BS, EP, Exercise Physiologist;Lesley Atkin Wynetta Emery, RN, BSN    Supervising physician immediately available to respond to emergencies  See telemetry face sheet for immediately available MD    Medication changes reported      Yes    Comments  Patient had a heart Cath 11/03/17 after a NSTEMI. Cardiologist added Plavix 75 mg daily and increased her Atorvastatin from 10 mg to 80 mg daily.     Fall or balance concerns reported     No    Warm-up and Cool-down  Performed as group-led instruction    Resistance Training Performed  Yes    VAD Patient?  No      Pain Assessment   Currently in Pain?  No/denies    Pain Score  0-No pain    Multiple Pain Sites  No       Capillary Blood Glucose: No results found for this or any previous visit (from the past 24 hour(s)).    Social History   Tobacco Use  Smoking Status Never Smoker  Smokeless Tobacco Former Systems developer  . Types: Snuff    Goals Met:  Independence with exercise equipment Exercise tolerated well No report of cardiac concerns or symptoms Strength training completed today  Goals Unmet:  Not Applicable  Comments: Check out 915.   Dr. Kate Sable is Medical Director for [facility] Cardiac and Pulmonary Rehab.

## 2017-11-18 NOTE — Patient Instructions (Signed)
Medication Instructions:  Your physician recommends that you continue on your current medications as directed. Please refer to the Current Medication list given to you today.   Labwork: NONE   Testing/Procedures: NONE   Follow-Up: Your physician recommends that you schedule a follow-up appointment in: 4 Months    Any Other Special Instructions Will Be Listed Below (If Applicable).     If you need a refill on your cardiac medications before your next appointment, please call your pharmacy.  Thank you for choosing [facility]!   

## 2017-11-21 ENCOUNTER — Encounter (HOSPITAL_COMMUNITY)
Admission: RE | Admit: 2017-11-21 | Discharge: 2017-11-21 | Disposition: A | Discharge status: Home / Self Care | Payer: Medicare HMO | Source: Ambulatory Visit | Attending: Cardiology | Admitting: Cardiology

## 2017-11-21 DIAGNOSIS — Z951 Presence of aortocoronary bypass graft: Secondary | ICD-10-CM | POA: Insufficient documentation

## 2017-11-21 NOTE — Progress Notes (Signed)
Daily Session Note  Patient Details  Name: Gloria Mccoy MRN: [id number] Date of Birth: 01/07/1946 Referring Provider:     CARDIAC REHAB PHASE II ORIENTATION from 09/05/2017 in [place] CARDIAC RE[facility]ILITATION  Referring Provider  Dr. Branch      Encounter Date: 11/21/2017  Check In: Session Check In - 11/21/17 0852      Check-In   Location  [facility]    Staff Present  Jjesus Dingley, BS, EP, Exercise Physiologist;Debra Johnson, RN, BSN;Diane Coad, MS, EP, [facility], Exercise Physiologist    Supervising physician immediately available to respond to emergencies  See telemetry face sheet for immediately available MD    Medication changes reported      No    Fall or balance concerns reported     No    Warm-up and Cool-down  Performed as group-led instruction    Resistance Training Performed  Yes    VAD Patient?  No      Pain Assessment   Currently in Pain?  No/denies    Pain Score  0-No pain    Multiple Pain Sites  No       Capillary Blood Glucose: No results found for this or any previous visit (from the past 24 hour(s)).    Social History   Tobacco Use  Smoking Status Never Smoker  Smokeless Tobacco Former User  . Types: Snuff    Goals Met:  Independence with exercise equipment Exercise tolerated well No report of cardiac concerns or symptoms Strength training completed today  Goals Unmet:  Not Applicable  Comments: Check out 915   Dr. Suresh Koneswaran is Medical Director for [facility] Card[facility] and Pulmonary Rehab. 

## 2017-11-23 ENCOUNTER — Encounter (HOSPITAL_COMMUNITY)
Admission: RE | Admit: 2017-11-23 | Discharge: 2017-11-23 | Disposition: A | Discharge status: Home / Self Care | Payer: Medicare HMO | Source: Ambulatory Visit | Attending: Cardiology | Admitting: Cardiology

## 2017-11-23 DIAGNOSIS — Z951 Presence of aortocoronary bypass graft: Secondary | ICD-10-CM | POA: Diagnosis not present

## 2017-11-23 NOTE — Progress Notes (Signed)
Daily Session Note  Patient Details  Name: Gloria Mccoy MRN: [id number] Date of Birth: 1946/01/02 Referring Provider:     CARDIAC REHAB PHASE II ORIENTATION from 09/05/2017 in [place]  Referring Provider  Dr. Harl Bowie      Encounter Date: 11/23/2017  Check In: Session Check In - 11/23/17 0815      Check-In   Location  [facility]    Staff Present  Russella Dar, MS, EP, [facility], Exercise Physiologist;Tige Meas Wynetta Emery, RN, BSN    Supervising physician immediately available to respond to emergencies  See telemetry face sheet for immediately available MD    Medication changes reported      No    Fall or balance concerns reported     No    Warm-up and Cool-down  Performed as group-led instruction    Resistance Training Performed  Yes    VAD Patient?  No      Pain Assessment   Currently in Pain?  No/denies    Pain Score  0-No pain    Multiple Pain Sites  No       Capillary Blood Glucose: No results found for this or any previous visit (from the past 24 hour(s)).    Social History   Tobacco Use  Smoking Status Never Smoker  Smokeless Tobacco Former Systems developer  . Types: Snuff    Goals Met:  Independence with exercise equipment Exercise tolerated well No report of cardiac concerns or symptoms Strength training completed today  Goals Unmet:  Not Applicable  Comments: Check out 915.   Dr. Kate Sable is Medical Director for [facility] Cardiac and Pulmonary Rehab.

## 2017-11-25 ENCOUNTER — Encounter (HOSPITAL_COMMUNITY)
Admission: RE | Admit: 2017-11-25 | Discharge: 2017-11-25 | Disposition: A | Discharge status: Home / Self Care | Payer: Medicare HMO | Source: Ambulatory Visit | Attending: Cardiology | Admitting: Cardiology

## 2017-11-25 DIAGNOSIS — Z951 Presence of aortocoronary bypass graft: Secondary | ICD-10-CM

## 2017-11-25 NOTE — Progress Notes (Signed)
Daily Session Note  Patient Details  Name: Gloria Mccoy MRN: [id number] Date of Birth: 1945-08-02 Referring Provider:     CARDIAC REHAB PHASE II ORIENTATION from 09/05/2017 in [place]  Referring Provider  Dr. Harl Bowie      Encounter Date: 11/25/2017  Check In: Session Check In - 11/25/17 0815      Check-In   Location  [facility]    Staff Present  Russella Dar, MS, EP, [facility], Exercise Physiologist;Tamera Pingley Wynetta Emery, RN, BSN    Supervising physician immediately available to respond to emergencies  See telemetry face sheet for immediately available MD    Medication changes reported      No    Fall or balance concerns reported     No    Warm-up and Cool-down  Performed as group-led instruction    Resistance Training Performed  Yes    VAD Patient?  No      Pain Assessment   Currently in Pain?  No/denies    Pain Score  0-No pain    Multiple Pain Sites  No       Capillary Blood Glucose: No results found for this or any previous visit (from the past 24 hour(s)).    Social History   Tobacco Use  Smoking Status Never Smoker  Smokeless Tobacco Former Systems developer  . Types: Snuff    Goals Met:  Independence with exercise equipment Exercise tolerated well No report of cardiac concerns or symptoms Strength training completed today  Goals Unmet:  Not Applicable  Comments: Check out 915.   Dr. Kate Sable is Medical Director for [facility] Cardiac and Pulmonary Rehab.

## 2017-11-28 ENCOUNTER — Encounter (HOSPITAL_COMMUNITY)
Admission: RE | Admit: 2017-11-28 | Discharge: 2017-11-28 | Disposition: A | Discharge status: Home / Self Care | Payer: Medicare HMO | Source: Ambulatory Visit | Attending: Cardiology | Admitting: Cardiology

## 2017-11-28 DIAGNOSIS — Z951 Presence of aortocoronary bypass graft: Secondary | ICD-10-CM | POA: Diagnosis not present

## 2017-11-28 NOTE — Progress Notes (Signed)
Daily Session Note  Patient Details  Name: Gloria Mccoy MRN: [id number] Date of Birth: [birth year] Referring Provider:     CARDIAC REHAB PHASE II ORIENTATION from 09/05/2017 in [place]  Referring Provider  Dr. Harl Bowie      Encounter Date: 11/28/2017  Check In: Session Check In - 11/28/17 0903      Check-In   Location  [facility]    Staff Present  Russella Dar, MS, EP, [facility], Exercise Physiologist;Debra Wynetta Emery, RN, BSN;Lehman Whiteley, BS, EP, Exercise Physiologist    Supervising physician immediately available to respond to emergencies  See telemetry face sheet for immediately available MD    Medication changes reported      No    Fall or balance concerns reported     No    Warm-up and Cool-down  Performed as group-led instruction    Resistance Training Performed  Yes    VAD Patient?  No      Pain Assessment   Currently in Pain?  No/denies    Pain Score  0-No pain    Multiple Pain Sites  No       Capillary Blood Glucose: No results found for this or any previous visit (from the past 24 hour(s)).    Social History   Tobacco Use  Smoking Status Never Smoker  Smokeless Tobacco Former Systems developer  . Types: Snuff    Goals Met:  Independence with exercise equipment Exercise tolerated well No report of cardiac concerns or symptoms Strength training completed today  Goals Unmet:  Not Applicable  Comments: Check out 915   Dr. Kate Sable is Medical Director for [place] and Pulmonary Rehab.

## 2017-11-30 ENCOUNTER — Encounter (HOSPITAL_COMMUNITY)
Admission: RE | Admit: 2017-11-30 | Discharge: 2017-11-30 | Disposition: A | Discharge status: Home / Self Care | Payer: Medicare HMO | Source: Ambulatory Visit | Attending: Cardiology | Admitting: Cardiology

## 2017-11-30 DIAGNOSIS — Z951 Presence of aortocoronary bypass graft: Secondary | ICD-10-CM | POA: Diagnosis not present

## 2017-11-30 NOTE — Progress Notes (Signed)
Daily Session Note  Patient Details  Name: Gloria Mccoy MRN: [id number] Date of Birth: 1945/12/03 Referring Provider:     CARDIAC REHAB PHASE II ORIENTATION from 09/05/2017 in [place]  Referring Provider  Dr. Harl Bowie      Encounter Date: 11/30/2017  Check In: Session Check In - 11/30/17 0815      Check-In   Location  [facility]    Staff Present  Russella Dar, MS, EP, [facility], Exercise Physiologist;Kaizer Dissinger Wynetta Emery, RN, BSN;Gregory Cowan, BS, EP, Exercise Physiologist    Supervising physician immediately available to respond to emergencies  See telemetry face sheet for immediately available MD    Medication changes reported      No    Fall or balance concerns reported     No    Warm-up and Cool-down  Performed as group-led instruction    Resistance Training Performed  Yes    VAD Patient?  No      Pain Assessment   Currently in Pain?  No/denies    Pain Score  0-No pain    Multiple Pain Sites  No       Capillary Blood Glucose: No results found for this or any previous visit (from the past 24 hour(s)).    Social History   Tobacco Use  Smoking Status Never Smoker  Smokeless Tobacco Former Systems developer  . Types: Snuff    Goals Met:  Independence with exercise equipment Exercise tolerated well No report of cardiac concerns or symptoms Strength training completed today  Goals Unmet:  Not Applicable  Comments: Check out 915.   Dr. Kate Sable is Medical Director for [facility] Cardiac and Pulmonary Rehab.

## 2017-12-02 ENCOUNTER — Encounter (HOSPITAL_COMMUNITY)
Admission: RE | Admit: 2017-12-02 | Discharge: 2017-12-02 | Disposition: A | Discharge status: Home / Self Care | Payer: Medicare HMO | Source: Ambulatory Visit | Attending: Cardiology | Admitting: Cardiology

## 2017-12-02 DIAGNOSIS — Z951 Presence of aortocoronary bypass graft: Secondary | ICD-10-CM

## 2017-12-02 NOTE — Progress Notes (Signed)
Cardiac Individual Treatment Plan  Patient Details  Name: Gloria Mccoy MRN: [id number] Date of Birth: [birth year] Referring Provider:     CARDIAC REHAB PHASE II ORIENTATION from 09/05/2017 in [facility]  Referring Provider  Dr. Harl Bowie      Initial Encounter Date:    CARDIAC REHAB PHASE II ORIENTATION from 09/05/2017 in [facility]  Date  09/05/17  Referring Provider  Dr. Harl Bowie      Visit Diagnosis: S/P CABG x 3  Patient's Home Medications on Admission:  Current Outpatient Medications:  .  acetaminophen (TYLENOL) 325 MG tablet, Take 2 tablets (650 mg total) by mouth every 6 (six) hours as needed for mild pain., Disp: , Rfl:  .  aspirin EC 81 MG tablet, Take 81 mg by mouth daily., Disp: , Rfl:  .  atorvastatin (LIPITOR) 80 MG tablet, Take 1 tablet (80 mg total) by mouth daily at 6 PM., Disp: 90 tablet, Rfl: 3 .  Cholecalciferol (VITAMIN D PO), Take 1 tablet by mouth daily. , Disp: , Rfl:  .  clopidogrel (PLAVIX) 75 MG tablet, Take 1 tablet (75 mg total) by mouth daily., Disp: 90 tablet, Rfl: 3 .  Cyanocobalamin (VITAMIN B-12 PO), Take 1 tablet by mouth daily., Disp: , Rfl:  .  fish oil-omega-3 fatty acids 1000 MG capsule, Take 1 g by mouth daily., Disp: , Rfl:  .  furosemide (LASIX) 40 MG tablet, Take 1 tablet (40 mg total) by mouth daily., Disp: 30 tablet, Rfl: 1 .  levothyroxine (SYNTHROID, LEVOTHROID) 75 MCG tablet, Take 75 mcg by mouth daily., Disp: , Rfl:  .  lisinopril (PRINIVIL,ZESTRIL) 2.5 MG tablet, Take 1 tablet (2.5 mg total) by mouth daily., Disp: 90 tablet, Rfl: 3 .  metoprolol tartrate (LOPRESSOR) 25 MG tablet, Take 1 tablet (25 mg total) by mouth 2 (two) times daily., Disp: 60 tablet, Rfl: 6 .  VITAMIN E PO, Take 1 tablet by mouth daily. , Disp: , Rfl:   Past Medical History: Past Medical History:  Diagnosis Date  . Arthritis    "hands sometimes" (07/13/2017)  . Coronary artery disease    a. s/p CABG x3 in 06/2017  with LIMA-LAD, SVG-D1, and SVG-RI.  b. 10/2017: cath showing a widely patent LIMA-LAD with ostial occlusion of the SVG-RI and subtotally occluded atretic SVG-D1. Graft occlusion thought to be 2ry to improvement in pre-CABG stenoses.   . Dips tobacco   . Essential hypertension   . Hypothyroid   . Meniere disease   . Obesity   . Osteopenia 01/2012   T score -1.3 FRAX 7.9%/0.6%    Tobacco Use: Social History   Tobacco Use  Smoking Status Never Smoker  Smokeless Tobacco Former Systems developer  . Types: Snuff    Labs: Recent Review Flowsheet Data    Labs for ITP Cardiac and Pulmonary Rehab Latest Ref Rng & Units 07/16/2017 07/16/2017 07/17/2017 10/28/2017 11/18/2017   Cholestrol 0 - 200 mg/dL - - - 96 79   LDLCALC 0 - 99 mg/dL - - - 56 44   HDL >40 mg/dL - - - 33(L) 25(L)   Trlycerides <150 mg/dL - - - 36 52   Hemoglobin A1c 4.8 - 5.6 % - - - - -   PHART 7.350 - 7.450 7.313(L) - - - -   PCO2ART 32.0 - 48.0 mmHg 46.5 - - - -   HCO3 20.0 - 28.0 mmol/L 23.3 - - - -   TCO2 22 - 32 mmol/L '25 25 28 ' - -  ACIDBASEDEF 0.0 - 2.0 mmol/L 3.0(H) - - - -   O2SAT % 99.0 - - - -      Capillary Blood Glucose: Lab Results  Component Value Date   GLUCAP 124 (H) 07/17/2017   GLUCAP 135 (H) 07/17/2017   GLUCAP 144 (H) 07/17/2017   GLUCAP 120 (H) 07/17/2017   GLUCAP 99 07/17/2017     Exercise Target Goals:    Exercise Program Goal: Individual exercise prescription set using results from initial 6 min walk test and THRR while considering  patient's activity barriers and safety.   Exercise Prescription Goal: Starting with aerobic activity 30 plus minutes a day, 3 days per week for initial exercise prescription. Provide home exercise prescription and guidelines that participant acknowledges understanding prior to discharge.  Activity Barriers & Risk Stratification: Activity Barriers & Cardiac Risk Stratification - 09/05/17 1018      Activity Barriers & Cardiac Risk Stratification   Activity Barriers   Deconditioning    Cardiac Risk Stratification  High       6 Minute Walk: 6 Minute Walk    Row Name 09/05/17 1018         6 Minute Walk   Phase  Initial     Distance  1050 feet     Distance % Change  0 %     Distance Feet Change  0 ft     Walk Time  6 minutes     # of Rest Breaks  0     MPH  1.98     METS  2.51     RPE  9     Perceived Dyspnea   9     VO2 Peak  7.06     Symptoms  No     Resting HR  73 bpm     Resting BP  110/70     Resting Oxygen Saturation   94 %     Exercise Oxygen Saturation  during 6 min walk  88 %     Max Ex. HR  107 bpm     Max Ex. BP  140/72     2 Minute Post BP  116/72        Oxygen Initial Assessment:   Oxygen Re-Evaluation:   Oxygen Discharge (Final Oxygen Re-Evaluation):   Initial Exercise Prescription: Initial Exercise Prescription - 09/05/17 1000      Date of Initial Exercise RX and Referring Provider   Date  09/05/17    Referring Provider  Dr. Harl Bowie      Treadmill   MPH  1.3    Grade  0    Minutes  15    METs  1.9      NuStep   Level  2    SPM  55    Minutes  20    METs  1.8      Prescription Details   Frequency (times per week)  3    Duration  Progress to 30 minutes of continuous aerobic without signs/symptoms of physical distress      Intensity   THRR 40-80% of Max Heartrate  [phone]    Ratings of Perceived Exertion  11-13    Perceived Dyspnea  0-4      Progression   Progression  Continue progressive overload as per policy without signs/symptoms or physical distress.      Resistance Training   Training Prescription  Yes    Weight  1    Reps  10-15  Perform Capillary Blood Glucose checks as needed.  Exercise Prescription Changes:  Exercise Prescription Changes    Row Name 09/15/17 1500 10/10/17 1400 10/25/17 0800 11/07/17 1400 11/18/17 1400     Response to Exercise   Blood Pressure (Admit)  146/70  130/68  130/60  122/68  134/62   Blood Pressure (Exercise)  130/70  140/80  148/74  158/78   150/80   Blood Pressure (Exit)  138/60  120/56  110/58  140/68  140/68   Heart Rate (Admit)  70 bpm  101 bpm  71 bpm  63 bpm  60 bpm   Heart Rate (Exercise)  65 bpm  107 bpm  96 bpm  100 bpm  95 bpm   Heart Rate (Exit)  79 bpm  73 bpm  76 bpm  72 bpm  74 bpm   Rating of Perceived Exertion (Exercise)  10  10  10  10  9    Duration  Continue with 30 min of aerobic exercise without signs/symptoms of physical distress.  Continue with 30 min of aerobic exercise without signs/symptoms of physical distress.  Continue with 30 min of aerobic exercise without signs/symptoms of physical distress.  Continue with 30 min of aerobic exercise without signs/symptoms of physical distress.  Continue with 30 min of aerobic exercise without signs/symptoms of physical distress.   Intensity  THRR New 101-117-132  THRR New 120-129-139  THRR New 102-117-133  THRR New 97-114-131  THRR New 95-112-160     Progression   Progression  Continue to progress workloads to maintain intensity without signs/symptoms of physical distress.  Continue to progress workloads to maintain intensity without signs/symptoms of physical distress.  Continue to progress workloads to maintain intensity without signs/symptoms of physical distress.  Continue to progress workloads to maintain intensity without signs/symptoms of physical distress.  Continue to progress workloads to maintain intensity without signs/symptoms of physical distress.     Resistance Training   Training Prescription  Yes  Yes  Yes  Yes  Yes   Weight  1  1  2  2  2    Reps  10-15  10-15  10-15  10-15  10-15     Treadmill   MPH  1.5  1.8  1.9  1.9  1.5   Grade  0  0  0  0  0   Minutes  15  15  15  15  15    METs  2.1  2.3  2.4  2.4  2.1     NuStep   Level  2  2  2  2  1    SPM  100  93  106  93  70   Minutes  20  20  20  20  20    METs  2.1  1.8  2.3  2.2  1.7      Exercise Comments:  Exercise Comments    Row Name 09/15/17 1538 10/10/17 1443 10/25/17 0818 11/07/17 1424  11/18/17 1454   Exercise Comments  Patient is doing very well in CR and has only just started but has increased her speed on the treadmill and her SPMs on the nustep substantially. patient will continued to be monitored over the course of the program by myself and the staff   Patient continues to do very well in CR. Patient has increased her speed on the treadmill to 1.8. Progression on treadmill mahy be hindered due to patients lack of knowledge on how to operate the machine independantly. Patient has  also maintained her level on the nustep as well as her watts. Patient will continued to be monitored throughout the remainder of the program.   Patient is doing very well in CR and has increased her speed on teh treadmill to 1.9. Patient has also increased her average SPMs on the nustep machine. Patient has not complained of any aches or illnesses while being active in teh program. Patient has shown evidence of increasing strength through her going up in weight for the warm up portion of the session.   Patient was doing well in CR. Since last progression patient has had a medical event and has not been attending CR. patient will return soon to the program but until then progression has ben halted.   Patient was taken down on speed on the treadmill due to a recent event and was also taken down on level for the nustep machine. Patient will progress again as soon as we makle sure she is back to normal from the event.       Exercise Goals and Review:  Exercise Goals    Row Name 09/05/17 1019             Exercise Goals   Increase Physical Activity  Yes       Intervention  Provide advice, education, support and counseling about physical activity/exercise needs.;Develop an individualized exercise prescription for aerobic and resistive training based on initial evaluation findings, risk stratification, comorbidities and participant's personal goals.       Expected Outcomes  Short Term: Attend rehab on a  regular basis to increase amount of physical activity.       Increase Strength and Stamina  Yes       Intervention  Develop an individualized exercise prescription for aerobic and resistive training based on initial evaluation findings, risk stratification, comorbidities and participant's personal goals.;Provide advice, education, support and counseling about physical activity/exercise needs.       Expected Outcomes  Short Term: Increase workloads from initial exercise prescription for resistance, speed, and METs.       Able to understand and use rate of perceived exertion (RPE) scale  Yes       Intervention  Provide education and explanation on how to use RPE scale       Expected Outcomes  Short Term: Able to use RPE daily in rehab to express subjective intensity level;Long Term:  Able to use RPE to guide intensity level when exercising independently       Able to understand and use Dyspnea scale  Yes       Intervention  Provide education and explanation on how to use Dyspnea scale       Expected Outcomes  Short Term: Able to use Dyspnea scale daily in rehab to express subjective sense of shortness of breath during exertion;Long Term: Able to use Dyspnea scale to guide intensity level when exercising independently       Knowledge and understanding of Target Heart Rate Range (THRR)  Yes       Intervention  Provide education and explanation of THRR including how the numbers were predicted and where they are located for reference       Expected Outcomes  Short Term: Able to state/look up THRR;Long Term: Able to use THRR to govern intensity when exercising independently;Short Term: Able to use daily as guideline for intensity in rehab       Able to check pulse independently  Yes       Intervention  Provide education and demonstration on how to check pulse in carotid and radial arteries.;Review the importance of being able to check your own pulse for safety during independent exercise       Expected  Outcomes  Short Term: Able to explain why pulse checking is important during independent exercise;Long Term: Able to check pulse independently and accurately       Understanding of Exercise Prescription  Yes       Intervention  Provide education, explanation, and written materials on patient's individual exercise prescription       Expected Outcomes  Short Term: Able to explain program exercise prescription;Long Term: Able to explain home exercise prescription to exercise independently          Exercise Goals Re-Evaluation : Exercise Goals Re-Evaluation    Row Name 09/15/17 1537 10/10/17 1441 11/07/17 1419 11/29/17 0801       Exercise Goal Re-Evaluation   Exercise Goals Review  Increase Physical Activity;Increase Strength and Stamina;Able to understand and use rate of perceived exertion (RPE) scale;Able to check pulse independently;Knowledge and understanding of Target Heart Rate Range (THRR);Able to understand and use Dyspnea scale;Understanding of Exercise Prescription  Increase Physical Activity;Increase Strength and Stamina;Able to understand and use rate of perceived exertion (RPE) scale;Able to check pulse independently;Knowledge and understanding of Target Heart Rate Range (THRR);Able to understand and use Dyspnea scale;Understanding of Exercise Prescription  Increase Physical Activity;Increase Strength and Stamina;Able to understand and use rate of perceived exertion (RPE) scale;Able to check pulse independently;Knowledge and understanding of Target Heart Rate Range (THRR);Able to understand and use Dyspnea scale;Understanding of Exercise Prescription  Increase Physical Activity;Increase Strength and Stamina;Able to understand and use rate of perceived exertion (RPE) scale;Able to check pulse independently;Knowledge and understanding of Target Heart Rate Range (THRR);Able to understand and use Dyspnea scale;Understanding of Exercise Prescription    Comments  Patient is doing very well in CR and  has only just started but has increased her speed on the treadmill and her SPMs on the nustep substantially. patient will continued to be monitored over the course of the program by myself and the staff   Patient continues to do very well in CR. Patient has increased her speed on the treadmill to 1.8. Progression on treadmill mahy be hindered due to patients lack of knowledge on how to operate the machine independantly. Patient has also maintained her level on the nustep as well as her watts. Patient will continued to be monitored throughout the remainder of the program.   Patient was doing well in CR. Since last progression patient has had a medical event and has not been attending CR. patient will return soon to the program but until then progression has ben halted.   Patient is doing well in CR. patient has returned and is back to her normal speed on the treadmill now. Patient states that she feels better and back to normal after the event had subsided. Patient seems to really enjoy the program and likes to be around her new friends.     Expected Outcomes  Patient wishes to get strength back and to lose weight  Patient wishes to get strength back and to lose weight  Patient wishes to get strength back and to lose weight  Patient wishes to get strength back and to lose weight        Discharge Exercise Prescription (Final Exercise Prescription Changes): Exercise Prescription Changes - 11/18/17 1400      Response to Exercise   Blood Pressure (Admit)  134/62    Blood Pressure (Exercise)  150/80    Blood Pressure (Exit)  140/68    Heart Rate (Admit)  60 bpm    Heart Rate (Exercise)  95 bpm    Heart Rate (Exit)  74 bpm    Rating of Perceived Exertion (Exercise)  9    Duration  Continue with 30 min of aerobic exercise without signs/symptoms of physical distress.    Intensity  THRR New 95-112-160      Progression   Progression  Continue to progress workloads to maintain intensity without  signs/symptoms of physical distress.      Resistance Training   Training Prescription  Yes    Weight  2    Reps  10-15      Treadmill   MPH  1.5    Grade  0    Minutes  15    METs  2.1      NuStep   Level  1    SPM  70    Minutes  20    METs  1.7       Nutrition:  Target Goals: Understanding of nutrition guidelines, daily intake of sodium 1500mg , cholesterol 200mg , calories 30% from fat and 7% or less from saturated fats, daily to have 5 or more servings of fruits and vegetables.  Biometrics: Pre Biometrics - 09/05/17 1019      Pre Biometrics   Height  5\' 2"  (1.575 m)    Weight  192 lb 3.2 oz (87.2 kg)    Waist Circumference  40 inches    Hip Circumference  41 inches    Waist to Hip Ratio  0.98 %    BMI (Calculated)  35.14    Triceps Skinfold  20 mm    % Body Fat  43.6 %    Grip Strength  49.4 kg    Flexibility  11.33 in    Single Leg Stand  3 seconds        Nutrition Therapy Plan and Nutrition Goals: Nutrition Therapy & Goals - 12/02/17 1246      Nutrition Therapy   RD appointment deferred  Yes      Personal Nutrition Goals   Personal Goal #2  Patient continues to say she is eating a heart healthy diet.     Additional Goals?  No       Nutrition Assessments: Nutrition Assessments - 09/05/17 1116      MEDFICTS Scores   Pre Score  9       Nutrition Goals Re-Evaluation:   Nutrition Goals Discharge (Final Nutrition Goals Re-Evaluation):   Psychosocial: Target Goals: Acknowledge presence or absence of significant depression and/or stress, maximize coping skills, provide positive support system. Participant is able to verbalize types and ability to use techniques and skills needed for reducing stress and depression.  Initial Review & Psychosocial Screening: Initial Psych Review & Screening - 09/05/17 1120      Initial Review   Current issues with  Current Sleep Concerns      Family Dynamics   Good Support System?  Yes      Barriers    Psychosocial barriers to participate in program  Psychosocial barriers identified (see note)      Screening Interventions   Interventions  Encouraged to exercise       Quality of Life Scores: Quality of Life - 09/05/17 1021      Quality of Life Scores   Health/Function Pre  19.97 %  Socioeconomic Pre  17.5 %    Psych/Spiritual Pre  20 %    Family Pre  17.8 %    GLOBAL Pre  19.15 %      Scores of 19 and below usually indicate a poorer quality of life in these areas.  A difference of  2-3 points is a clinically meaningful difference.  A difference of 2-3 points in the total score of the Quality of Life Index has been associated with significant improvement in overall quality of life, self-image, physical symptoms, and general health in studies assessing change in quality of life.  PHQ-9: Recent Review Flowsheet Data    Depression screen [facility] 2/9 09/05/2017   Decreased Interest 0   Down, Depressed, Hopeless 0   PHQ - 2 Score 0   Altered sleeping 2   Tired, decreased energy 2   Change in appetite 0   Feeling bad or failure about yourself  0   Trouble concentrating 0   Moving slowly or fidgety/restless 0   Suicidal thoughts 0   PHQ-9 Score 4   Difficult doing work/chores Not difficult at all     Interpretation of Total Score  Total Score Depression Severity:  1-4 = Minimal depression, 5-9 = Mild depression, 10-14 = Moderate depression, 15-19 = Moderately severe depression, 20-27 = Severe depression   Psychosocial Evaluation and Intervention: Psychosocial Evaluation - 09/05/17 1121      Psychosocial Evaluation & Interventions   Interventions  Encouraged to exercise with the program and follow exercise prescription    Continue Psychosocial Services   Follow up required by staff       Psychosocial Re-Evaluation: Psychosocial Re-Evaluation    [place] Name 09/22/17 0943 10/19/17 1409 11/08/17 1251 12/02/17 1253       Psychosocial Re-Evaluation   Current issues with  Current  Sleep Concerns  Current Sleep Concerns  Current Sleep Concerns  Current Sleep Concerns    Comments  Patient's initial QOL was 19.15 and her PHQ-9 was 4 with current sleep issues.   Patient's initial QOL was 19.15 and her PHQ-9 was 4 with current sleep issues.   Patient's initial QOL was 19.15 and her PHQ-9 was 4 with current sleep issues.   Patient's initial QOL was 19.15 and her PHQ-9 was 4 with current sleep issues.     Expected Outcomes  Patient will have no additional psychosocial issues identified at discharge and her sleep issues will be managed.   Patient will have no additional psychosocial issues identified at discharge and her sleep issues will be managed.   Patient will have no additional psychosocial issues identified at discharge and her sleep issues will be managed.   Patient will have no additional psychosocial issues identified at discharge and her sleep issues will be managed.     Interventions  Stress management education;Encouraged to attend Cardiac Rehabilitation for the exercise;Relaxation education  Stress management education;Encouraged to attend Cardiac Rehabilitation for the exercise;Relaxation education  Stress management education;Encouraged to attend Cardiac Rehabilitation for the exercise;Relaxation education  Stress management education;Encouraged to attend Cardiac Rehabilitation for the exercise;Relaxation education    Continue Psychosocial Services   No Follow up required  No Follow up required  No Follow up required  No Follow up required       Psychosocial Discharge (Final Psychosocial Re-Evaluation): Psychosocial Re-Evaluation - 12/02/17 1253      Psychosocial Re-Evaluation   Current issues with  Current Sleep Concerns    Comments  Patient's initial QOL was 19.15 and her PHQ-9  was 4 with current sleep issues.     Expected Outcomes  Patient will have no additional psychosocial issues identified at discharge and her sleep issues will be managed.     Interventions   Stress management education;Encouraged to attend Cardiac Rehabilitation for the exercise;Relaxation education    Continue Psychosocial Services   No Follow up required       Vocational Rehabilitation: Provide vocational rehab assistance to qualifying candidates.   Vocational Rehab Evaluation & Intervention: Vocational Rehab - 09/05/17 1113      Initial Vocational Rehab Evaluation & Intervention   Assessment shows need for Vocational Rehabilitation  No       Education: Education Goals: Education classes will be provided on a weekly basis, covering required topics. Participant will state understanding/return demonstration of topics presented.  Learning Barriers/Preferences: Learning Barriers/Preferences - 09/05/17 1056      Learning Barriers/Preferences   Learning Barriers  Hearing    Learning Preferences  Written Material;Skilled Demonstration;Pictoral;Group Instruction       Education Topics: Hypertension, Hypertension Reduction -Define heart disease and high blood pressure. Discus how high blood pressure affects the body and ways to reduce high blood pressure.   CARDIAC REHAB PHASE II EXERCISE from 11/30/2017 in [place]  Date  10/26/17  Educator  Russella Dar  Instruction Review Code  2- Demonstrated Understanding      Exercise and Your Heart -Discuss why it is important to exercise, the FITT principles of exercise, normal and abnormal responses to exercise, and how to exercise safely.   Angina -Discuss definition of angina, causes of angina, treatment of angina, and how to decrease risk of having angina.   Cardiac Medications -Review what the following cardiac medications are used for, how they affect the body, and side effects that may occur when taking the medications.  Medications include Aspirin, Beta blockers, calcium channel blockers, ACE Inhibitors, angiotensin receptor blockers, diuretics, digoxin, and antihyperlipidemics.   CARDIAC  REHAB PHASE II EXERCISE from 11/30/2017 in [place]  Date  11/16/17  Educator  DC  Instruction Review Code  2- Demonstrated Understanding      Congestive Heart Failure -Discuss the definition of CHF, how to live with CHF, the signs and symptoms of CHF, and how keep track of weight and sodium intake.   CARDIAC REHAB PHASE II EXERCISE from 11/30/2017 in [place]  Date  11/23/17  Educator  DC  Instruction Review Code  2- Demonstrated Understanding      Heart Disease and Intimacy -Discus the effect sexual activity has on the heart, how changes occur during intimacy as we age, and safety during sexual activity.   CARDIAC REHAB PHASE II EXERCISE from 11/30/2017 in [place]  Date  11/30/17  Educator  DJ  Instruction Review Code  2- Demonstrated Understanding      Smoking Cessation / COPD -Discuss different methods to quit smoking, the health benefits of quitting smoking, and the definition of COPD.   CARDIAC REHAB PHASE II EXERCISE from 11/30/2017 in [place]  Date  09/08/17  Educator  DC  Instruction Review Code  2- Demonstrated Understanding      Nutrition I: Fats -Discuss the types of cholesterol, what cholesterol does to the heart, and how cholesterol levels can be controlled.   CARDIAC REHAB PHASE II EXERCISE from 11/30/2017 in [place]  Date  09/14/17  Educator  DC  Instruction Review Code  2- Demonstrated Understanding  Nutrition II: Labels -Discuss the different components of food labels and how to read food label   CARDIAC REHAB PHASE II EXERCISE from 11/30/2017 in [place]  Date  09/21/17  Educator  DJ  Instruction Review Code  2- Demonstrated Understanding      Heart Parts/Heart Disease and PAD -Discuss the anatomy of the heart, the pathway of blood circulation through the heart, and these are affected by heart  disease.   CARDIAC REHAB PHASE II EXERCISE from 11/30/2017 in [place]  Date  09/28/17  Educator  DJ  Instruction Review Code  2- Demonstrated Understanding      Stress I: Signs and Symptoms -Discuss the causes of stress, how stress may lead to anxiety and depression, and ways to limit stress.   CARDIAC REHAB PHASE II EXERCISE from 11/30/2017 in [place]  Date  10/05/17  Educator  [place]  Instruction Review Code  2- Demonstrated Understanding      Stress II: Relaxation -Discuss different types of relaxation techniques to limit stress.   CARDIAC REHAB PHASE II EXERCISE from 11/30/2017 in [place]  Date  10/12/17  Educator  DJ  Instruction Review Code  2- Demonstrated Understanding      Warning Signs of Stroke / TIA -Discuss definition of a stroke, what the signs and symptoms are of a stroke, and how to identify when someone is having stroke.   CARDIAC REHAB PHASE II EXERCISE from 11/30/2017 in [place]  Date  10/19/17  Educator  DC  Instruction Review Code  2- Demonstrated Understanding      Knowledge Questionnaire Score: Knowledge Questionnaire Score - 09/05/17 1111      Knowledge Questionnaire Score   Pre Score  20/24       Core Components/Risk Factors/Patient Goals at Admission: Personal Goals and Risk Factors at Admission - 09/05/17 1116      Core Components/Risk Factors/Patient Goals on Admission    Weight Management  Yes    Intervention  Weight Management/Obesity: Establish reasonable short term and long term weight goals.;Obesity: Provide education and appropriate resources to help participant work on and attain dietary goals.    Admit Weight  192 lb 3.2 oz (87.2 kg)    Goal Weight: Short Term  183 lb 6.4 oz (83.2 kg)    Goal Weight: Long Term  174 lb 11.2 oz (79.2 kg)    Personal Goal Other  Yes    Personal Goal  Gain strength, Lose 17 pounds, get physically active  again.     Intervention  Attend CR 3 x week and supplement with exercise at home 2 x week.     Expected Outcomes  To reach personal goals.        Core Components/Risk Factors/Patient Goals Review:  Goals and Risk Factor Review    Row Name 09/22/17 0940 10/19/17 1407 11/08/17 1243 12/02/17 1246       Core Components/Risk Factors/Patient Goals Review   Personal Goals Review  Weight Management/Obesity Get strength back; lose 17 lbs; get physically active.  Weight Management/Obesity Get strenght back; lose 17 lbs; get physically active.   Weight Management/Obesity Get strength back; lose 17 lbs; get physically active.   Weight Management/Obesity Get strength back; lose 17 lbs; get physically active.     Review  Patient has completed 8 sessions maintaining her weight. She is doing well in the program with some progression. She says it is too soon to tell if  the program is making a difference. She hope to meet her goals as the continues the program. Will continue to monitor for progress.   Patient has completed 20 sessions losing 1.5 lbs since she started the program. She continues to do well in the program with progression. She says she does feel stronger and has more energy but is not where she wants to be. She hopes she will improve more as she continues to attend sessions. Will continue to monitor for progress.   Patient has completed 26 sessions gaining 1 lb since last 30 day review. She complained of having tightness in her chest while walking on the treadmill at her last session 11/02/17. She was sent to the ED where her 2nd troponin level was elevated. She was diagnosed with a NSTEMI and was transported to [facility] for a heart cath which did not show any additional blockages. She was started on Plavix 75 mg for a least the next year and her Atrovastatin was increased. She was instructed to rest and return to [facility] Wednesday 11/16/16. Prior to this event, she was doing well in the program with progression. She  was able to pull her trashcan up her driveway for the first time since her surgery with minimal SOB and fatigue. She said she was able to do her house work with less fatigue. Will contiunue to monitor for progress.   Patient has completed 34 sessions losing 2 lbs since last 30 day review. Patient continues to do well in the program with progression. She says she continues to build strenght and has more energy. Her blood pressure has improved since her visit to the ED and starting Plavix. She has not had any more chest pain since being sent to ED. She has gradually increased her activity at home. Will continue to monitor for progress.      Expected Outcomes  Patient will continue to attend sessions and complete the program.   Patient will continue to attend sessions and complete the program.   Patient will continue to attend sessions and complete the program.   Patient will continue to attend sessions and complete the program.        Core Components/Risk Factors/Patient Goals at Discharge (Final Review):  Goals and Risk Factor Review - 12/02/17 1246      Core Components/Risk Factors/Patient Goals Review   Personal Goals Review  Weight Management/Obesity Get strength back; lose 17 lbs; get physically active.     Review  Patient has completed 34 sessions losing 2 lbs since last 30 day review. Patient continues to do well in the program with progression. She says she continues to build strenght and has more energy. Her blood pressure has improved since her visit to the ED and starting Plavix. She has not had any more chest pain since being sent to ED. She has gradually increased her activity at home. Will continue to monitor for progress.      Expected Outcomes  Patient will continue to attend sessions and complete the program.        ITP Comments: ITP Comments    Row Name 09/05/17 1114           ITP Comments  Mrs. Croll is a pleasant 18 yearld female. She has just had CABGx3. She is feeling  great. Still a little sore where the incision site is. Other than that she is doing well and is eager to get started.           Comments: ITP  30 Day REVIEW Patient doing well in the program. Will continue to monitor for progress.

## 2017-12-02 NOTE — Progress Notes (Signed)
Daily Session Note  Patient Details  Name: Gloria Mccoy MRN: [id number] Date of Birth: 22-Jul-1945 Referring Provider:     CARDIAC REHAB PHASE II ORIENTATION from 09/05/2017 in [place]  Referring Provider  Dr. Harl Bowie      Encounter Date: 12/02/2017  Check In: Session Check In - 12/02/17 0842      Check-In   Location  [facility]    Staff Present  Russella Dar, MS, EP, [facility], Exercise Physiologist;Debra Wynetta Emery, RN, BSN;Korben Carcione, BS, EP, Exercise Physiologist    Supervising physician immediately available to respond to emergencies  See telemetry face sheet for immediately available MD    Medication changes reported      No    Fall or balance concerns reported     No    Warm-up and Cool-down  Performed as group-led instruction    Resistance Training Performed  Yes    VAD Patient?  No      Pain Assessment   Currently in Pain?  No/denies    Pain Score  0-No pain    Multiple Pain Sites  No       Capillary Blood Glucose: No results found for this or any previous visit (from the past 24 hour(s)).    Social History   Tobacco Use  Smoking Status Never Smoker  Smokeless Tobacco Former Systems developer  . Types: Snuff    Goals Met:  Independence with exercise equipment Exercise tolerated well No report of cardiac concerns or symptoms Strength training completed today  Goals Unmet:  Not Applicable  Comments: Check out 915   Dr. Kate Sable is Medical Director for [place] and Pulmonary Rehab.

## 2017-12-05 ENCOUNTER — Encounter (HOSPITAL_COMMUNITY)
Admission: RE | Admit: 2017-12-05 | Discharge: 2017-12-05 | Disposition: A | Discharge status: Home / Self Care | Payer: Medicare HMO | Source: Ambulatory Visit | Attending: Cardiology | Admitting: Cardiology

## 2017-12-05 DIAGNOSIS — Z951 Presence of aortocoronary bypass graft: Secondary | ICD-10-CM

## 2017-12-05 NOTE — Progress Notes (Signed)
Daily Session Note  Patient Details  Name: Gloria Mccoy MRN: [id number] Date of Birth: [birth year] Referring Provider:     CARDIAC REHAB PHASE II ORIENTATION from 09/05/2017 in [place]  Referring Provider  Dr. Harl Bowie      Encounter Date: 12/05/2017  Check In: Session Check In - 12/05/17 0837      Check-In   Location  [facility]    Staff Present  Russella Dar, MS, EP, [facility], Exercise Physiologist;Debra Wynetta Emery, RN, BSN;Glinda Natzke, BS, EP, Exercise Physiologist    Supervising physician immediately available to respond to emergencies  See telemetry face sheet for immediately available MD    Medication changes reported      No    Fall or balance concerns reported     No    Warm-up and Cool-down  Performed as group-led instruction    Resistance Training Performed  Yes    VAD Patient?  No      Pain Assessment   Currently in Pain?  No/denies    Pain Score  0-No pain    Multiple Pain Sites  No       Capillary Blood Glucose: No results found for this or any previous visit (from the past 24 hour(s)).    Social History   Tobacco Use  Smoking Status Never Smoker  Smokeless Tobacco Former Systems developer  . Types: Snuff    Goals Met:  Independence with exercise equipment Exercise tolerated well No report of cardiac concerns or symptoms Strength training completed today  Goals Unmet:  Not Applicable  Comments: Check out 915   Dr. Kate Sable is Medical Director for [place] and Pulmonary Rehab.

## 2017-12-07 ENCOUNTER — Encounter (HOSPITAL_COMMUNITY)
Admission: RE | Admit: 2017-12-07 | Discharge: 2017-12-07 | Disposition: A | Discharge status: Home / Self Care | Payer: Medicare HMO | Source: Ambulatory Visit | Attending: Cardiology | Admitting: Cardiology

## 2017-12-07 VITALS — Ht 62.0 in | Wt 195.8 lb

## 2017-12-07 DIAGNOSIS — Z951 Presence of aortocoronary bypass graft: Secondary | ICD-10-CM | POA: Diagnosis not present

## 2017-12-07 NOTE — Progress Notes (Signed)
Daily Session Note  Patient Details  Name: Gloria Mccoy MRN: [id number] Date of Birth: [birth year] Referring Provider:     CARDIAC REHAB PHASE II ORIENTATION from 09/05/2017 in [place]  Referring Provider  Dr. Harl Bowie      Encounter Date: 12/07/2017  Check In: Session Check In - 12/07/17 0815      Check-In   Location  [facility]    Staff Present  Russella Dar, MS, EP, [facility], Exercise Physiologist;Makenzie Vittorio Wynetta Emery, RN, BSN;Gregory Cowan, BS, EP, Exercise Physiologist    Supervising physician immediately available to respond to emergencies  See telemetry face sheet for immediately available MD    Medication changes reported      No    Fall or balance concerns reported     No    Warm-up and Cool-down  Performed as group-led instruction    Resistance Training Performed  Yes    VAD Patient?  No      Pain Assessment   Currently in Pain?  No/denies    Pain Score  0-No pain    Multiple Pain Sites  No       Capillary Blood Glucose: No results found for this or any previous visit (from the past 24 hour(s)).    Social History   Tobacco Use  Smoking Status Never Smoker  Smokeless Tobacco Former Systems developer  . Types: Snuff    Goals Met:  Independence with exercise equipment Exercise tolerated well No report of cardiac concerns or symptoms Strength training completed today  Goals Unmet:  Not Applicable  Comments: Check out 915.   Dr. Kate Sable is Medical Director for [facility] Cardiac and Pulmonary Rehab.

## 2017-12-08 NOTE — Progress Notes (Signed)
Discharge Progress Report  Patient Details  Name: Gloria Mccoy MRN: [id number] Date of Birth: [birth year] Referring Provider:     CARDIAC REHAB PHASE II ORIENTATION from 09/05/2017 in [place]  Referring Provider  Dr. Harl Bowie       Number of Visits: 36  Reason for Discharge:  Patient reached a stable level of exercise. Patient independent in their exercise. Patient has met program and personal goals.  Smoking History:  Social History   Tobacco Use  Smoking Status Never Smoker  Smokeless Tobacco Former Systems developer  . Types: Snuff    Diagnosis:  S/P CABG x 3  ADL UCSD:   Initial Exercise Prescription: Initial Exercise Prescription - 09/05/17 1000      Date of Initial Exercise RX and Referring Provider   Date  09/05/17    Referring Provider  Dr. Harl Bowie      Treadmill   MPH  1.3    Grade  0    Minutes  15    METs  1.9      NuStep   Level  2    SPM  55    Minutes  20    METs  1.8      Prescription Details   Frequency (times per week)  3    Duration  Progress to 30 minutes of continuous aerobic without signs/symptoms of physical distress      Intensity   THRR 40-80% of Max Heartrate  [phone]    Ratings of Perceived Exertion  11-13    Perceived Dyspnea  0-4      Progression   Progression  Continue progressive overload as per policy without signs/symptoms or physical distress.      Resistance Training   Training Prescription  Yes    Weight  1    Reps  10-15       Discharge Exercise Prescription (Final Exercise Prescription Changes): Exercise Prescription Changes - 12/06/17 0800      Response to Exercise   Blood Pressure (Admit)  134/70    Blood Pressure (Exercise)  164/80    Blood Pressure (Exit)  134/70    Heart Rate (Admit)  83 bpm    Heart Rate (Exercise)  92 bpm    Heart Rate (Exit)  85 bpm    Rating of Perceived Exertion (Exercise)  12    Duration  Continue with 30 min of aerobic exercise without signs/symptoms of  physical distress.    Intensity  THRR New [phone]      Progression   Progression  Continue to progress workloads to maintain intensity without signs/symptoms of physical distress.      Resistance Training   Training Prescription  Yes    Weight  2    Reps  10-15      Treadmill   MPH  1.9    Grade  0    Minutes  15    METs  2.45      NuStep   Level  2    SPM  103    Minutes  20    METs  2.2       Functional Capacity: 6 Minute Walk    Row Name 09/05/17 1018 12/07/17 1447       6 Minute Walk   Phase  Initial  Discharge    Distance  1050 feet  1300 feet    Distance % Change  0 %  23.81 %    Distance Feet Change  0 ft  250 ft    Walk Time  6 minutes  6 minutes    # of Rest Breaks  0  0    MPH  1.98  2.46    METS  2.51  2.88    RPE  9  12    Perceived Dyspnea   9  10    VO2 Peak  7.06  9.69    Symptoms  No  No    Resting HR  73 bpm  64 bpm    Resting BP  110/70  134/70    Resting Oxygen Saturation   94 %  94 %    Exercise Oxygen Saturation  during 6 min walk  88 %  89 %    Max Ex. HR  107 bpm  120 bpm    Max Ex. BP  140/72  164/80    2 Minute Post BP  116/72  134/70       Psychological, QOL, Others - Outcomes: PHQ 2/9: Depression screen [facility] 2/9 12/08/2017 09/05/2017  Decreased Interest 0 0  Down, Depressed, Hopeless 0 0  PHQ - 2 Score 0 0  Altered sleeping 0 2  Tired, decreased energy 0 2  Change in appetite 0 0  Feeling bad or failure about yourself  0 0  Trouble concentrating 0 0  Moving slowly or fidgety/restless 0 0  Suicidal thoughts 0 0  PHQ-9 Score 0 4  Difficult doing work/chores Not difficult at all Not difficult at all    Quality of Life: Quality of Life - 12/07/17 1449      Quality of Life Scores   Health/Function Pre  19.97 %    Health/Function Post  25.71 %    Health/Function % Change  28.74 %    Socioeconomic Pre  17.5 %    Socioeconomic Post  27.43 %    Socioeconomic % Change   56.74 %    Psych/Spiritual Pre  20 %     Psych/Spiritual Post  26 %    Psych/Spiritual % Change  30 %    Family Pre  17.8 %    Family Post  26.4 %    Family % Change  48.31 %    GLOBAL Pre  19.15 %    GLOBAL Post  26.25 %    GLOBAL % Change  37.08 %       Personal Goals: Goals established at orientation with interventions provided to work toward goal. Personal Goals and Risk Factors at Admission - 09/05/17 1116      Core Components/Risk Factors/Patient Goals on Admission    Weight Management  Yes    Intervention  Weight Management/Obesity: Establish reasonable short term and long term weight goals.;Obesity: Provide education and appropriate resources to help participant work on and attain dietary goals.    Admit Weight  192 lb 3.2 oz (87.2 kg)    Goal Weight: Short Term  183 lb 6.4 oz (83.2 kg)    Goal Weight: Long Term  174 lb 11.2 oz (79.2 kg)    Personal Goal Other  Yes    Personal Goal  Gain strength, Lose 17 pounds, get physically active again.     Intervention  Attend CR 3 x week and supplement with exercise at home 2 x week.     Expected Outcomes  To reach personal goals.         Personal Goals Discharge: Goals and Risk Factor Review    Row Name 09/22/17 0940 10/19/17 1407 11/08/17  1243 12/02/17 1246 12/08/17 1604     Core Components/Risk Factors/Patient Goals Review   Personal Goals Review  Weight Management/Obesity Get strength back; lose 17 lbs; get physically active.  Weight Management/Obesity Get strenght back; lose 17 lbs; get physically active.   Weight Management/Obesity Get strength back; lose 17 lbs; get physically active.   Weight Management/Obesity Get strength back; lose 17 lbs; get physically active.   Weight Management/Obesity Get strength back; get physically active; lose 17 lbs.    Review  Patient has completed 8 sessions maintaining her weight. She is doing well in the program with some progression. She says it is too soon to tell if the program is making a difference. She hope to meet her goals as  the continues the program. Will continue to monitor for progress.   Patient has completed 20 sessions losing 1.5 lbs since she started the program. She continues to do well in the program with progression. She says she does feel stronger and has more energy but is not where she wants to be. She hopes she will improve more as she continues to attend sessions. Will continue to monitor for progress.   Patient has completed 26 sessions gaining 1 lb since last 30 day review. She complained of having tightness in her chest while walking on the treadmill at her last session 11/02/17. She was sent to the ED where her 2nd troponin level was elevated. She was diagnosed with a NSTEMI and was transported to [facility] for a heart cath which did not show any additional blockages. She was started on Plavix 75 mg for a least the next year and her Atrovastatin was increased. She was instructed to rest and return to [facility] Wednesday 11/16/16. Prior to this event, she was doing well in the program with progression. She was able to pull her trashcan up her driveway for the first time since her surgery with minimal SOB and fatigue. She said she was able to do her house work with less fatigue. Will contiunue to monitor for progress.   Patient has completed 34 sessions losing 2 lbs since last 30 day review. Patient continues to do well in the program with progression. She says she continues to build strenght and has more energy. Her blood pressure has improved since her visit to the ED and starting Plavix. She has not had any more chest pain since being sent to ED. She has gradually increased her activity at home. Will continue to monitor for progress.    Patient graduated with 36 sessions gaining 3 lbs overall. She did well in the program. She says she feels better overall and does feel stronger and has more energy. She did not meet her weight loss goal but feels the program did help her meet her goals. Her exit measurements improved in grip  strength, balance and flexibility. Her exit walk test improved by 23.81%. She says she get some chest tightness when she rushes. She says her cardiologist says to expect this. She plans to continue exercising by walking at home.    Expected Outcomes  Patient will continue to attend sessions and complete the program.   Patient will continue to attend sessions and complete the program.   Patient will continue to attend sessions and complete the program.   Patient will continue to attend sessions and complete the program.   Patient will continue exercising by walking at home and continue to meet her personal goals. CR will f/u for one year.  Exercise Goals and Review: Exercise Goals    Row Name 09/05/17 1019             Exercise Goals   Increase Physical Activity  Yes       Intervention  Provide advice, education, support and counseling about physical activity/exercise needs.;Develop an individualized exercise prescription for aerobic and resistive training based on initial evaluation findings, risk stratification, comorbidities and participant's personal goals.       Expected Outcomes  Short Term: Attend rehab on a regular basis to increase amount of physical activity.       Increase Strength and Stamina  Yes       Intervention  Develop an individualized exercise prescription for aerobic and resistive training based on initial evaluation findings, risk stratification, comorbidities and participant's personal goals.;Provide advice, education, support and counseling about physical activity/exercise needs.       Expected Outcomes  Short Term: Increase workloads from initial exercise prescription for resistance, speed, and METs.       Able to understand and use rate of perceived exertion (RPE) scale  Yes       Intervention  Provide education and explanation on how to use RPE scale       Expected Outcomes  Short Term: Able to use RPE daily in rehab to express subjective intensity level;Long Term:   Able to use RPE to guide intensity level when exercising independently       Able to understand and use Dyspnea scale  Yes       Intervention  Provide education and explanation on how to use Dyspnea scale       Expected Outcomes  Short Term: Able to use Dyspnea scale daily in rehab to express subjective sense of shortness of breath during exertion;Long Term: Able to use Dyspnea scale to guide intensity level when exercising independently       Knowledge and understanding of Target Heart Rate Range (THRR)  Yes       Intervention  Provide education and explanation of THRR including how the numbers were predicted and where they are located for reference       Expected Outcomes  Short Term: Able to state/look up THRR;Long Term: Able to use THRR to govern intensity when exercising independently;Short Term: Able to use daily as guideline for intensity in rehab       Able to check pulse independently  Yes       Intervention  Provide education and demonstration on how to check pulse in carotid and radial arteries.;Review the importance of being able to check your own pulse for safety during independent exercise       Expected Outcomes  Short Term: Able to explain why pulse checking is important during independent exercise;Long Term: Able to check pulse independently and accurately       Understanding of Exercise Prescription  Yes       Intervention  Provide education, explanation, and written materials on patient's individual exercise prescription       Expected Outcomes  Short Term: Able to explain program exercise prescription;Long Term: Able to explain home exercise prescription to exercise independently          Nutrition & Weight - Outcomes: Pre Biometrics - 09/05/17 1019      Pre Biometrics   Height  '5\' 2"'$  (1.575 m)    Weight  192 lb 3.2 oz (87.2 kg)    Waist Circumference  40 inches    Hip Circumference  41 inches  Waist to Hip Ratio  0.98 %    BMI (Calculated)  35.14    Triceps Skinfold   20 mm    % Body Fat  43.6 %    Grip Strength  49.4 kg    Flexibility  11.33 in    Single Leg Stand  3 seconds      Post Biometrics - 12/07/17 1448       Post  Biometrics   Height  _0  (1.575 m)    Weight  195 lb 12.3 oz (88.8 kg)    Waist Circumference  40 inches    Hip Circumference  41 inches    Waist to Hip Ratio  0.98 %    BMI (Calculated)  35.8    Triceps Skinfold  19 mm    % Body Fat  43.7 %    Grip Strength  58 kg    Flexibility  10.66 in    Single Leg Stand  16 seconds       Nutrition: Nutrition Therapy & Goals - 12/02/17 1246      Nutrition Therapy   RD appointment deferred  Yes      Personal Nutrition Goals   Personal Goal #2  Patient continues to say she is eating a heart healthy diet.     Additional Goals?  No       Nutrition Discharge: Nutrition Assessments - 12/08/17 1602      MEDFICTS Scores   Pre Score  9    Post Score  24    Score Difference  15       Education Questionnaire Score: Knowledge Questionnaire Score - 12/08/17 1602      Knowledge Questionnaire Score   Pre Score  20/24    Post Score  17/24       Goals reviewed with patient; copy given to patient.

## 2017-12-08 NOTE — Progress Notes (Signed)
Cardiac Individual Treatment Plan  Patient Details  Name: Gloria Mccoy MRN: [id number] Date of Birth: [birth year] Referring Provider:     CARDIAC REHAB PHASE II ORIENTATION from 09/05/2017 in [facility]  Referring Provider  Dr. Harl Bowie      Initial Encounter Date:    CARDIAC REHAB PHASE II ORIENTATION from 09/05/2017 in [facility]  Date  09/05/17  Referring Provider  Dr. Harl Bowie      Visit Diagnosis: S/P CABG x 3  Patient's Home Medications on Admission:  Current Outpatient Medications:  .  acetaminophen (TYLENOL) 325 MG tablet, Take 2 tablets (650 mg total) by mouth every 6 (six) hours as needed for mild pain., Disp: , Rfl:  .  aspirin EC 81 MG tablet, Take 81 mg by mouth daily., Disp: , Rfl:  .  atorvastatin (LIPITOR) 80 MG tablet, Take 1 tablet (80 mg total) by mouth daily at 6 PM., Disp: 90 tablet, Rfl: 3 .  Cholecalciferol (VITAMIN D PO), Take 1 tablet by mouth daily. , Disp: , Rfl:  .  clopidogrel (PLAVIX) 75 MG tablet, Take 1 tablet (75 mg total) by mouth daily., Disp: 90 tablet, Rfl: 3 .  Cyanocobalamin (VITAMIN B-12 PO), Take 1 tablet by mouth daily., Disp: , Rfl:  .  fish oil-omega-3 fatty acids 1000 MG capsule, Take 1 g by mouth daily., Disp: , Rfl:  .  furosemide (LASIX) 40 MG tablet, Take 1 tablet (40 mg total) by mouth daily., Disp: 30 tablet, Rfl: 1 .  levothyroxine (SYNTHROID, LEVOTHROID) 75 MCG tablet, Take 75 mcg by mouth daily., Disp: , Rfl:  .  lisinopril (PRINIVIL,ZESTRIL) 2.5 MG tablet, Take 1 tablet (2.5 mg total) by mouth daily., Disp: 90 tablet, Rfl: 3 .  metoprolol tartrate (LOPRESSOR) 25 MG tablet, Take 1 tablet (25 mg total) by mouth 2 (two) times daily., Disp: 60 tablet, Rfl: 6 .  VITAMIN E PO, Take 1 tablet by mouth daily. , Disp: , Rfl:   Past Medical History: Past Medical History:  Diagnosis Date  . Arthritis    "hands sometimes" (07/13/2017)  . Coronary artery disease    a. s/p CABG x3 in 06/2017  with LIMA-LAD, SVG-D1, and SVG-RI.  b. 10/2017: cath showing a widely patent LIMA-LAD with ostial occlusion of the SVG-RI and subtotally occluded atretic SVG-D1. Graft occlusion thought to be 2ry to improvement in pre-CABG stenoses.   . Dips tobacco   . Essential hypertension   . Hypothyroid   . Meniere disease   . Obesity   . Osteopenia 01/2012   T score -1.3 FRAX 7.9%/0.6%    Tobacco Use: Social History   Tobacco Use  Smoking Status Never Smoker  Smokeless Tobacco Former Systems developer  . Types: Snuff    Labs: Recent Review Flowsheet Data    Labs for ITP Cardiac and Pulmonary Rehab Latest Ref Rng & Units 07/16/2017 07/16/2017 07/17/2017 10/28/2017 11/18/2017   Cholestrol 0 - 200 mg/dL - - - 96 79   LDLCALC 0 - 99 mg/dL - - - 56 44   HDL >40 mg/dL - - - 33(L) 25(L)   Trlycerides <150 mg/dL - - - 36 52   Hemoglobin A1c 4.8 - 5.6 % - - - - -   PHART 7.350 - 7.450 7.313(L) - - - -   PCO2ART 32.0 - 48.0 mmHg 46.5 - - - -   HCO3 20.0 - 28.0 mmol/L 23.3 - - - -   TCO2 22 - 32 mmol/L '25 25 28 ' - -  ACIDBASEDEF 0.0 - 2.0 mmol/L 3.0(H) - - - -   O2SAT % 99.0 - - - -      Capillary Blood Glucose: Lab Results  Component Value Date   GLUCAP 124 (H) 07/17/2017   GLUCAP 135 (H) 07/17/2017   GLUCAP 144 (H) 07/17/2017   GLUCAP 120 (H) 07/17/2017   GLUCAP 99 07/17/2017     Exercise Target Goals:    Exercise Program Goal: Individual exercise prescription set using results from initial 6 min walk test and THRR while considering  patient's activity barriers and safety.   Exercise Prescription Goal: Starting with aerobic activity 30 plus minutes a day, 3 days per week for initial exercise prescription. Provide home exercise prescription and guidelines that participant acknowledges understanding prior to discharge.  Activity Barriers & Risk Stratification: Activity Barriers & Cardiac Risk Stratification - 09/05/17 1018      Activity Barriers & Cardiac Risk Stratification   Activity Barriers   Deconditioning    Cardiac Risk Stratification  High       6 Minute Walk: 6 Minute Walk    Row Name 09/05/17 1018 12/07/17 1447       6 Minute Walk   Phase  Initial  Discharge    Distance  1050 feet  1300 feet    Distance % Change  0 %  23.81 %    Distance Feet Change  0 ft  250 ft    Walk Time  6 minutes  6 minutes    # of Rest Breaks  0  0    MPH  1.98  2.46    METS  2.51  2.88    RPE  9  12    Perceived Dyspnea   9  10    VO2 Peak  7.06  9.69    Symptoms  No  No    Resting HR  73 bpm  64 bpm    Resting BP  110/70  134/70    Resting Oxygen Saturation   94 %  94 %    Exercise Oxygen Saturation  during 6 min walk  88 %  89 %    Max Ex. HR  107 bpm  120 bpm    Max Ex. BP  140/72  164/80    2 Minute Post BP  116/72  134/70       Oxygen Initial Assessment:   Oxygen Re-Evaluation:   Oxygen Discharge (Final Oxygen Re-Evaluation):   Initial Exercise Prescription: Initial Exercise Prescription - 09/05/17 1000      Date of Initial Exercise RX and Referring Provider   Date  09/05/17    Referring Provider  Dr. Harl Bowie      Treadmill   MPH  1.3    Grade  0    Minutes  15    METs  1.9      NuStep   Level  2    SPM  55    Minutes  20    METs  1.8      Prescription Details   Frequency (times per week)  3    Duration  Progress to 30 minutes of continuous aerobic without signs/symptoms of physical distress      Intensity   THRR 40-80% of Max Heartrate  [phone]    Ratings of Perceived Exertion  11-13    Perceived Dyspnea  0-4      Progression   Progression  Continue progressive overload as per policy without signs/symptoms or physical distress.  Resistance Training   Training Prescription  Yes    Weight  1    Reps  10-15       Perform Capillary Blood Glucose checks as needed.  Exercise Prescription Changes:  Exercise Prescription Changes    Row Name 09/15/17 1500 10/10/17 1400 10/25/17 0800 11/07/17 1400 11/18/17 1400     Response to  Exercise   Blood Pressure (Admit)  146/70  130/68  130/60  122/68  134/62   Blood Pressure (Exercise)  130/70  140/80  148/74  158/78  150/80   Blood Pressure (Exit)  138/60  120/56  110/58  140/68  140/68   Heart Rate (Admit)  70 bpm  101 bpm  71 bpm  63 bpm  60 bpm   Heart Rate (Exercise)  65 bpm  107 bpm  96 bpm  100 bpm  95 bpm   Heart Rate (Exit)  79 bpm  73 bpm  76 bpm  72 bpm  74 bpm   Rating of Perceived Exertion (Exercise)  '10  10  10  10  9   ' Duration  Continue with 30 min of aerobic exercise without signs/symptoms of physical distress.  Continue with 30 min of aerobic exercise without signs/symptoms of physical distress.  Continue with 30 min of aerobic exercise without signs/symptoms of physical distress.  Continue with 30 min of aerobic exercise without signs/symptoms of physical distress.  Continue with 30 min of aerobic exercise without signs/symptoms of physical distress.   Intensity  THRR New 101-117-132  THRR New 120-129-139  THRR New 102-117-133  THRR New 97-114-131  THRR New 95-112-160     Progression   Progression  Continue to progress workloads to maintain intensity without signs/symptoms of physical distress.  Continue to progress workloads to maintain intensity without signs/symptoms of physical distress.  Continue to progress workloads to maintain intensity without signs/symptoms of physical distress.  Continue to progress workloads to maintain intensity without signs/symptoms of physical distress.  Continue to progress workloads to maintain intensity without signs/symptoms of physical distress.     Resistance Training   Training Prescription  Yes  Yes  Yes  Yes  Yes   Weight  '1  1  2  2  2   ' Reps  10-15  10-15  10-15  10-15  10-15     Treadmill   MPH  1.5  1.8  1.9  1.9  1.5   Grade  0  0  0  0  0   Minutes  '15  15  15  15  15   ' METs  2.1  2.3  2.4  2.4  2.1     NuStep   Level  '2  2  2  2  1   ' SPM  100  93  106  93  70   Minutes  '20  20  20  20  20   ' METs  2.1   1.8  2.3  2.2  1.7   Row Name 12/06/17 0800             Response to Exercise   Blood Pressure (Admit)  134/70       Blood Pressure (Exercise)  164/80       Blood Pressure (Exit)  134/70       Heart Rate (Admit)  83 bpm       Heart Rate (Exercise)  92 bpm       Heart Rate (Exit)  85 bpm  Rating of Perceived Exertion (Exercise)  12       Duration  Continue with 30 min of aerobic exercise without signs/symptoms of physical distress.       Intensity  THRR New 109-122-135         Progression   Progression  Continue to progress workloads to maintain intensity without signs/symptoms of physical distress.         Resistance Training   Training Prescription  Yes       Weight  2       Reps  10-15         Treadmill   MPH  1.9       Grade  0       Minutes  15       METs  2.45         NuStep   Level  2       SPM  103       Minutes  20       METs  2.2          Exercise Comments:  Exercise Comments    Row Name 09/15/17 1538 10/10/17 1443 10/25/17 0818 11/07/17 1424 11/18/17 1454   Exercise Comments  Patient is doing very well in CR and has only just started but has increased her speed on the treadmill and her SPMs on the nustep substantially. patient will continued to be monitored over the course of the program by myself and the staff   Patient continues to do very well in CR. Patient has increased her speed on the treadmill to 1.8. Progression on treadmill mahy be hindered due to patients lack of knowledge on how to operate the machine independantly. Patient has also maintained her level on the nustep as well as her watts. Patient will continued to be monitored throughout the remainder of the program.   Patient is doing very well in CR and has increased her speed on teh treadmill to 1.9. Patient has also increased her average SPMs on the nustep machine. Patient has not complained of any aches or illnesses while being active in teh program. Patient has shown evidence of increasing  strength through her going up in weight for the warm up portion of the session.   Patient was doing well in CR. Since last progression patient has had a medical event and has not been attending CR. patient will return soon to the program but until then progression has ben halted.   Patient was taken down on speed on the treadmill due to a recent event and was also taken down on level for the nustep machine. Patient will progress again as soon as we makle sure she is back to normal from the event.    [place] Name 12/06/17 [phone]           Exercise Comments  Patient has increased her speeds and levels back to where she was before her event. Patient has been doing very well and is about to graduate from the program. patient has stated that she will be walking a lot after leaving the program.           Exercise Goals and Review:  Exercise Goals    Row Name 09/05/17 1019             Exercise Goals   Increase Physical Activity  Yes       Intervention  Provide advice, education, support and counseling about physical activity/exercise needs.;Develop an individualized exercise  prescription for aerobic and resistive training based on initial evaluation findings, risk stratification, comorbidities and participant's personal goals.       Expected Outcomes  Short Term: Attend rehab on a regular basis to increase amount of physical activity.       Increase Strength and Stamina  Yes       Intervention  Develop an individualized exercise prescription for aerobic and resistive training based on initial evaluation findings, risk stratification, comorbidities and participant's personal goals.;Provide advice, education, support and counseling about physical activity/exercise needs.       Expected Outcomes  Short Term: Increase workloads from initial exercise prescription for resistance, speed, and METs.       Able to understand and use rate of perceived exertion (RPE) scale  Yes       Intervention  Provide  education and explanation on how to use RPE scale       Expected Outcomes  Short Term: Able to use RPE daily in rehab to express subjective intensity level;Long Term:  Able to use RPE to guide intensity level when exercising independently       Able to understand and use Dyspnea scale  Yes       Intervention  Provide education and explanation on how to use Dyspnea scale       Expected Outcomes  Short Term: Able to use Dyspnea scale daily in rehab to express subjective sense of shortness of breath during exertion;Long Term: Able to use Dyspnea scale to guide intensity level when exercising independently       Knowledge and understanding of Target Heart Rate Range (THRR)  Yes       Intervention  Provide education and explanation of THRR including how the numbers were predicted and where they are located for reference       Expected Outcomes  Short Term: Able to state/look up THRR;Long Term: Able to use THRR to govern intensity when exercising independently;Short Term: Able to use daily as guideline for intensity in rehab       Able to check pulse independently  Yes       Intervention  Provide education and demonstration on how to check pulse in carotid and radial arteries.;Review the importance of being able to check your own pulse for safety during independent exercise       Expected Outcomes  Short Term: Able to explain why pulse checking is important during independent exercise;Long Term: Able to check pulse independently and accurately       Understanding of Exercise Prescription  Yes       Intervention  Provide education, explanation, and written materials on patient's individual exercise prescription       Expected Outcomes  Short Term: Able to explain program exercise prescription;Long Term: Able to explain home exercise prescription to exercise independently          Exercise Goals Re-Evaluation : Exercise Goals Re-Evaluation    Row Name 09/15/17 1537 10/10/17 1441 11/07/17 1419 11/29/17  0801       Exercise Goal Re-Evaluation   Exercise Goals Review  Increase Physical Activity;Increase Strength and Stamina;Able to understand and use rate of perceived exertion (RPE) scale;Able to check pulse independently;Knowledge and understanding of Target Heart Rate Range (THRR);Able to understand and use Dyspnea scale;Understanding of Exercise Prescription  Increase Physical Activity;Increase Strength and Stamina;Able to understand and use rate of perceived exertion (RPE) scale;Able to check pulse independently;Knowledge and understanding of Target Heart Rate Range (THRR);Able to understand and use Dyspnea scale;Understanding  of Exercise Prescription  Increase Physical Activity;Increase Strength and Stamina;Able to understand and use rate of perceived exertion (RPE) scale;Able to check pulse independently;Knowledge and understanding of Target Heart Rate Range (THRR);Able to understand and use Dyspnea scale;Understanding of Exercise Prescription  Increase Physical Activity;Increase Strength and Stamina;Able to understand and use rate of perceived exertion (RPE) scale;Able to check pulse independently;Knowledge and understanding of Target Heart Rate Range (THRR);Able to understand and use Dyspnea scale;Understanding of Exercise Prescription    Comments  Patient is doing very well in CR and has only just started but has increased her speed on the treadmill and her SPMs on the nustep substantially. patient will continued to be monitored over the course of the program by myself and the staff   Patient continues to do very well in CR. Patient has increased her speed on the treadmill to 1.8. Progression on treadmill mahy be hindered due to patients lack of knowledge on how to operate the machine independantly. Patient has also maintained her level on the nustep as well as her watts. Patient will continued to be monitored throughout the remainder of the program.   Patient was doing well in CR. Since last  progression patient has had a medical event and has not been attending CR. patient will return soon to the program but until then progression has ben halted.   Patient is doing well in CR. patient has returned and is back to her normal speed on the treadmill now. Patient states that she feels better and back to normal after the event had subsided. Patient seems to really enjoy the program and likes to be around her new friends.     Expected Outcomes  Patient wishes to get strength back and to lose weight  Patient wishes to get strength back and to lose weight  Patient wishes to get strength back and to lose weight  Patient wishes to get strength back and to lose weight        Discharge Exercise Prescription (Final Exercise Prescription Changes): Exercise Prescription Changes - 12/06/17 0800      Response to Exercise   Blood Pressure (Admit)  134/70    Blood Pressure (Exercise)  164/80    Blood Pressure (Exit)  134/70    Heart Rate (Admit)  83 bpm    Heart Rate (Exercise)  92 bpm    Heart Rate (Exit)  85 bpm    Rating of Perceived Exertion (Exercise)  12    Duration  Continue with 30 min of aerobic exercise without signs/symptoms of physical distress.    Intensity  THRR New 109-122-135      Progression   Progression  Continue to progress workloads to maintain intensity without signs/symptoms of physical distress.      Resistance Training   Training Prescription  Yes    Weight  2    Reps  10-15      Treadmill   MPH  1.9    Grade  0    Minutes  15    METs  2.45      NuStep   Level  2    SPM  103    Minutes  20    METs  2.2       Nutrition:  Target Goals: Understanding of nutrition guidelines, daily intake of sodium <1548m, cholesterol <2079m calories 30% from fat and 7% or less from saturated fats, daily to have 5 or more servings of fruits and vegetables.  Biometrics: Pre Biometrics - 09/05/17  1019      Pre Biometrics   Height  '5\' 2"'  (1.575 m)    Weight  192 lb 3.2  oz (87.2 kg)    Waist Circumference  40 inches    Hip Circumference  41 inches    Waist to Hip Ratio  0.98 %    BMI (Calculated)  35.14    Triceps Skinfold  20 mm    % Body Fat  43.6 %    Grip Strength  49.4 kg    Flexibility  11.33 in    Single Leg Stand  3 seconds      Post Biometrics - 12/07/17 1448       Post  Biometrics   Height  '5\' 2"'  (1.575 m)    Weight  195 lb 12.3 oz (88.8 kg)    Waist Circumference  40 inches    Hip Circumference  41 inches    Waist to Hip Ratio  0.98 %    BMI (Calculated)  35.8    Triceps Skinfold  19 mm    % Body Fat  43.7 %    Grip Strength  58 kg    Flexibility  10.66 in    Single Leg Stand  16 seconds       Nutrition Therapy Plan and Nutrition Goals: Nutrition Therapy & Goals - 12/02/17 1246      Nutrition Therapy   RD appointment deferred  Yes      Personal Nutrition Goals   Personal Goal #2  Patient continues to say she is eating a heart healthy diet.     Additional Goals?  No       Nutrition Assessments: Nutrition Assessments - 12/08/17 1602      MEDFICTS Scores   Pre Score  9    Post Score  24    Score Difference  15       Nutrition Goals Re-Evaluation:   Nutrition Goals Discharge (Final Nutrition Goals Re-Evaluation):   Psychosocial: Target Goals: Acknowledge presence or absence of significant depression and/or stress, maximize coping skills, provide positive support system. Participant is able to verbalize types and ability to use techniques and skills needed for reducing stress and depression.  Initial Review & Psychosocial Screening: Initial Psych Review & Screening - 09/05/17 1120      Initial Review   Current issues with  Current Sleep Concerns      Family Dynamics   Good Support System?  Yes      Barriers   Psychosocial barriers to participate in program  Psychosocial barriers identified (see note)      Screening Interventions   Interventions  Encouraged to exercise       Quality of Life  Scores: Quality of Life - 12/07/17 1449      Quality of Life Scores   Health/Function Pre  19.97 %    Health/Function Post  25.71 %    Health/Function % Change  28.74 %    Socioeconomic Pre  17.5 %    Socioeconomic Post  27.43 %    Socioeconomic % Change   56.74 %    Psych/Spiritual Pre  20 %    Psych/Spiritual Post  26 %    Psych/Spiritual % Change  30 %    Family Pre  17.8 %    Family Post  26.4 %    Family % Change  48.31 %    GLOBAL Pre  19.15 %    GLOBAL Post  26.25 %  GLOBAL % Change  37.08 %      Scores of 19 and below usually indicate a poorer quality of life in these areas.  A difference of  2-3 points is a clinically meaningful difference.  A difference of 2-3 points in the total score of the Quality of Life Index has been associated with significant improvement in overall quality of life, self-image, physical symptoms, and general health in studies assessing change in quality of life.  PHQ-9: Recent Review Flowsheet Data    Depression screen [facility] 2/9 12/08/2017 09/05/2017   Decreased Interest 0 0   Down, Depressed, Hopeless 0 0   PHQ - 2 Score 0 0   Altered sleeping 0 2   Tired, decreased energy 0 2   Change in appetite 0 0   Feeling bad or failure about yourself  0 0   Trouble concentrating 0 0   Moving slowly or fidgety/restless 0 0   Suicidal thoughts 0 0   PHQ-9 Score 0 4   Difficult doing work/chores Not difficult at all Not difficult at all     Interpretation of Total Score  Total Score Depression Severity:  1-4 = Minimal depression, 5-9 = Mild depression, 10-14 = Moderate depression, 15-19 = Moderately severe depression, 20-27 = Severe depression   Psychosocial Evaluation and Intervention: Psychosocial Evaluation - 12/08/17 1612      Discharge Psychosocial Assessment & Intervention   Comments  Patient has no psychosocial issues identified at discharge. She says she sleeps good most nights. Her exit QOL score improved by 37.08% at 26.25 and her PHQ-9  score improved by 4 at 0.       Psychosocial Re-Evaluation: Psychosocial Re-Evaluation    Row Name 09/22/17 [phone] 10/19/17 1409 11/08/17 1251 12/02/17 1253       Psychosocial Re-Evaluation   Current issues with  Current Sleep Concerns  Current Sleep Concerns  Current Sleep Concerns  Current Sleep Concerns    Comments  Patient's initial QOL was 19.15 and her PHQ-9 was 4 with current sleep issues.   Patient's initial QOL was 19.15 and her PHQ-9 was 4 with current sleep issues.   Patient's initial QOL was 19.15 and her PHQ-9 was 4 with current sleep issues.   Patient's initial QOL was 19.15 and her PHQ-9 was 4 with current sleep issues.     Expected Outcomes  Patient will have no additional psychosocial issues identified at discharge and her sleep issues will be managed.   Patient will have no additional psychosocial issues identified at discharge and her sleep issues will be managed.   Patient will have no additional psychosocial issues identified at discharge and her sleep issues will be managed.   Patient will have no additional psychosocial issues identified at discharge and her sleep issues will be managed.     Interventions  Stress management education;Encouraged to attend Cardiac Rehabilitation for the exercise;Relaxation education  Stress management education;Encouraged to attend Cardiac Rehabilitation for the exercise;Relaxation education  Stress management education;Encouraged to attend Cardiac Rehabilitation for the exercise;Relaxation education  Stress management education;Encouraged to attend Cardiac Rehabilitation for the exercise;Relaxation education    Continue Psychosocial Services   No Follow up required  No Follow up required  No Follow up required  No Follow up required       Psychosocial Discharge (Final Psychosocial Re-Evaluation): Psychosocial Re-Evaluation - 12/02/17 1253      Psychosocial Re-Evaluation   Current issues with  Current Sleep Concerns    Comments  Patient's  initial QOL  was 19.15 and her PHQ-9 was 4 with current sleep issues.     Expected Outcomes  Patient will have no additional psychosocial issues identified at discharge and her sleep issues will be managed.     Interventions  Stress management education;Encouraged to attend Cardiac Rehabilitation for the exercise;Relaxation education    Continue Psychosocial Services   No Follow up required       Vocational Rehabilitation: Provide vocational rehab assistance to qualifying candidates.   Vocational Rehab Evaluation & Intervention: Vocational Rehab - 09/05/17 1113      Initial Vocational Rehab Evaluation & Intervention   Assessment shows need for Vocational Rehabilitation  No       Education: Education Goals: Education classes will be provided on a weekly basis, covering required topics. Participant will state understanding/return demonstration of topics presented.  Learning Barriers/Preferences: Learning Barriers/Preferences - 09/05/17 1056      Learning Barriers/Preferences   Learning Barriers  Hearing    Learning Preferences  Written Material;Skilled Demonstration;Pictoral;Group Instruction       Education Topics: Hypertension, Hypertension Reduction -Define heart disease and high blood pressure. Discus how high blood pressure affects the body and ways to reduce high blood pressure.   CARDIAC REHAB PHASE II EXERCISE from 12/07/2017 in [place]  Date  10/26/17  Educator  Russella Dar  Instruction Review Code  2- Demonstrated Understanding      Exercise and Your Heart -Discuss why it is important to exercise, the FITT principles of exercise, normal and abnormal responses to exercise, and how to exercise safely.   Angina -Discuss definition of angina, causes of angina, treatment of angina, and how to decrease risk of having angina.   Cardiac Medications -Review what the following cardiac medications are used for, how they affect the body, and side  effects that may occur when taking the medications.  Medications include Aspirin, Beta blockers, calcium channel blockers, ACE Inhibitors, angiotensin receptor blockers, diuretics, digoxin, and antihyperlipidemics.   CARDIAC REHAB PHASE II EXERCISE from 12/07/2017 in [place]  Date  11/16/17  Educator  DC  Instruction Review Code  2- Demonstrated Understanding      Congestive Heart Failure -Discuss the definition of CHF, how to live with CHF, the signs and symptoms of CHF, and how keep track of weight and sodium intake.   CARDIAC REHAB PHASE II EXERCISE from 12/07/2017 in [place]  Date  11/23/17  Educator  DC  Instruction Review Code  2- Demonstrated Understanding      Heart Disease and Intimacy -Discus the effect sexual activity has on the heart, how changes occur during intimacy as we age, and safety during sexual activity.   CARDIAC REHAB PHASE II EXERCISE from 12/07/2017 in [place]  Date  11/30/17  Educator  DJ  Instruction Review Code  2- Demonstrated Understanding      Smoking Cessation / COPD -Discuss different methods to quit smoking, the health benefits of quitting smoking, and the definition of COPD.   CARDIAC REHAB PHASE II EXERCISE from 12/07/2017 in [place]  Date  09/08/17  Educator  DC  Instruction Review Code  2- Demonstrated Understanding      Nutrition I: Fats -Discuss the types of cholesterol, what cholesterol does to the heart, and how cholesterol levels can be controlled.   CARDIAC REHAB PHASE II EXERCISE from 12/07/2017 in [place]  Date  09/14/17  Educator  DC  Instruction Review Code  2-  Demonstrated Understanding      Nutrition II: Labels -Discuss the different components of food labels and how to read food label   CARDIAC REHAB PHASE II EXERCISE from 12/07/2017 in [place]  Date  09/21/17  Educator   DJ  Instruction Review Code  2- Demonstrated Understanding      Heart Parts/Heart Disease and PAD -Discuss the anatomy of the heart, the pathway of blood circulation through the heart, and these are affected by heart disease.   CARDIAC REHAB PHASE II EXERCISE from 12/07/2017 in [place]  Date  09/28/17  Educator  DJ  Instruction Review Code  2- Demonstrated Understanding      Stress I: Signs and Symptoms -Discuss the causes of stress, how stress may lead to anxiety and depression, and ways to limit stress.   CARDIAC REHAB PHASE II EXERCISE from 12/07/2017 in [place]  Date  10/05/17  Educator  [place]  Instruction Review Code  2- Demonstrated Understanding      Stress II: Relaxation -Discuss different types of relaxation techniques to limit stress.   CARDIAC REHAB PHASE II EXERCISE from 12/07/2017 in [place]  Date  10/12/17  Educator  DJ  Instruction Review Code  2- Demonstrated Understanding      Warning Signs of Stroke / TIA -Discuss definition of a stroke, what the signs and symptoms are of a stroke, and how to identify when someone is having stroke.   CARDIAC REHAB PHASE II EXERCISE from 12/07/2017 in [place]  Date  10/19/17  Educator  DC  Instruction Review Code  2- Demonstrated Understanding      Knowledge Questionnaire Score: Knowledge Questionnaire Score - 12/08/17 1602      Knowledge Questionnaire Score   Pre Score  20/24    Post Score  17/24       Core Components/Risk Factors/Patient Goals at Admission: Personal Goals and Risk Factors at Admission - 09/05/17 1116      Core Components/Risk Factors/Patient Goals on Admission    Weight Management  Yes    Intervention  Weight Management/Obesity: Establish reasonable short term and long term weight goals.;Obesity: Provide education and appropriate resources to help participant work on and attain dietary goals.     Admit Weight  192 lb 3.2 oz (87.2 kg)    Goal Weight: Short Term  183 lb 6.4 oz (83.2 kg)    Goal Weight: Long Term  174 lb 11.2 oz (79.2 kg)    Personal Goal Other  Yes    Personal Goal  Gain strength, Lose 17 pounds, get physically active again.     Intervention  Attend CR 3 x week and supplement with exercise at home 2 x week.     Expected Outcomes  To reach personal goals.        Core Components/Risk Factors/Patient Goals Review:  Goals and Risk Factor Review    Row Name 09/22/17 0940 10/19/17 1407 11/08/17 1243 12/02/17 1246 12/08/17 1604     Core Components/Risk Factors/Patient Goals Review   Personal Goals Review  Weight Management/Obesity Get strength back; lose 17 lbs; get physically active.  Weight Management/Obesity Get strenght back; lose 17 lbs; get physically active.   Weight Management/Obesity Get strength back; lose 17 lbs; get physically active.   Weight Management/Obesity Get strength back; lose 17 lbs; get physically active.   Weight Management/Obesity Get strength back; get physically active; lose 17 lbs.    Review  Patient  has completed 8 sessions maintaining her weight. She is doing well in the program with some progression. She says it is too soon to tell if the program is making a difference. She hope to meet her goals as the continues the program. Will continue to monitor for progress.   Patient has completed 20 sessions losing 1.5 lbs since she started the program. She continues to do well in the program with progression. She says she does feel stronger and has more energy but is not where she wants to be. She hopes she will improve more as she continues to attend sessions. Will continue to monitor for progress.   Patient has completed 26 sessions gaining 1 lb since last 30 day review. She complained of having tightness in her chest while walking on the treadmill at her last session 11/02/17. She was sent to the ED where her 2nd troponin level was elevated. She was diagnosed  with a NSTEMI and was transported to [facility] for a heart cath which did not show any additional blockages. She was started on Plavix 75 mg for a least the next year and her Atrovastatin was increased. She was instructed to rest and return to [facility] Wednesday 11/16/16. Prior to this event, she was doing well in the program with progression. She was able to pull her trashcan up her driveway for the first time since her surgery with minimal SOB and fatigue. She said she was able to do her house work with less fatigue. Will contiunue to monitor for progress.   Patient has completed 34 sessions losing 2 lbs since last 30 day review. Patient continues to do well in the program with progression. She says she continues to build strenght and has more energy. Her blood pressure has improved since her visit to the ED and starting Plavix. She has not had any more chest pain since being sent to ED. She has gradually increased her activity at home. Will continue to monitor for progress.    Patient graduated with 36 sessions gaining 3 lbs overall. She did well in the program. She says she feels better overall and does feel stronger and has more energy. She did not meet her weight loss goal but feels the program did help her meet her goals. Her exit measurements improved in grip strength, balance and flexibility. Her exit walk test improved by 23.81%. She says she get some chest tightness when she rushes. She says her cardiologist says to expect this. She plans to continue exercising by walking at home.    Expected Outcomes  Patient will continue to attend sessions and complete the program.   Patient will continue to attend sessions and complete the program.   Patient will continue to attend sessions and complete the program.   Patient will continue to attend sessions and complete the program.   Patient will continue exercising by walking at home and continue to meet her personal goals. CR will f/u for one year.       Core  Components/Risk Factors/Patient Goals at Discharge (Final Review):  Goals and Risk Factor Review - 12/08/17 1604      Core Components/Risk Factors/Patient Goals Review   Personal Goals Review  Weight Management/Obesity Get strength back; get physically active; lose 17 lbs.     Review  Patient graduated with 36 sessions gaining 3 lbs overall. She did well in the program. She says she feels better overall and does feel stronger and has more energy. She did not meet her weight  loss goal but feels the program did help her meet her goals. Her exit measurements improved in grip strength, balance and flexibility. Her exit walk test improved by 23.81%. She says she get some chest tightness when she rushes. She says her cardiologist says to expect this. She plans to continue exercising by walking at home.     Expected Outcomes  Patient will continue exercising by walking at home and continue to meet her personal goals. CR will f/u for one year.        ITP Comments: ITP Comments    Row Name 09/05/17 1114           ITP Comments  Mrs. Gearing is a pleasant 32 yearld female. She has just had CABGx3. She is feeling great. Still a little sore where the incision site is. Other than that she is doing well and is eager to get started.           Comments: Patient graduated from [place] today on 12/07/17 after completing 36 sessions. She achieved LTG of 30 minutes of aerobic exercise at Max Met level of 2.88. All patients vitals are WNL. Patient has met with dietician. Discharge instruction has been reviewed in detail and patient stated an understanding of material given. Patient plans continue exercising at home by walking. Cardiac Rehab staff will make f/u calls at 1 month, 6 months, and 1 year. Patient had no complaints of any abnormal S/S or pain on their exit visit.

## 2017-12-09 ENCOUNTER — Encounter (HOSPITAL_COMMUNITY): Payer: Medicare HMO

## 2018-01-10 DIAGNOSIS — E782 Mixed hyperlipidemia: Secondary | ICD-10-CM | POA: Diagnosis not present

## 2018-01-10 DIAGNOSIS — I10 Essential (primary) hypertension: Secondary | ICD-10-CM | POA: Diagnosis not present

## 2018-01-10 DIAGNOSIS — E063 Autoimmune thyroiditis: Secondary | ICD-10-CM | POA: Diagnosis not present

## 2018-01-10 DIAGNOSIS — Z0001 Encounter for general adult medical examination with abnormal findings: Secondary | ICD-10-CM | POA: Diagnosis not present

## 2018-01-10 DIAGNOSIS — I2510 Atherosclerotic heart disease of native coronary artery without angina pectoris: Secondary | ICD-10-CM | POA: Diagnosis not present

## 2018-01-10 DIAGNOSIS — E6609 Other obesity due to excess calories: Secondary | ICD-10-CM | POA: Diagnosis not present

## 2018-01-10 DIAGNOSIS — Z1389 Encounter for screening for other disorder: Secondary | ICD-10-CM | POA: Diagnosis not present

## 2018-01-10 DIAGNOSIS — Z6832 Body mass index (BMI) 32.0-32.9, adult: Secondary | ICD-10-CM | POA: Diagnosis not present

## 2018-01-17 ENCOUNTER — Other Ambulatory Visit: Payer: Self-pay | Admitting: Physician Assistant

## 2018-01-17 ENCOUNTER — Other Ambulatory Visit: Payer: Self-pay | Admitting: *Deleted

## 2018-01-17 MED ORDER — METOPROLOL TARTRATE 25 MG PO TABS: 25.0000 mg | ORAL_TABLET | Freq: Two times a day (BID) | ORAL | 6 refills | Status: DC

## 2018-05-04 ENCOUNTER — Encounter: Payer: Self-pay | Admitting: Cardiology

## 2018-05-04 ENCOUNTER — Ambulatory Visit: Payer: Medicare HMO | Admitting: Cardiology

## 2018-05-04 VITALS — BP 132/84 | HR 60 | Ht 62.0 in | Wt 197.2 lb

## 2018-05-04 DIAGNOSIS — E782 Mixed hyperlipidemia: Secondary | ICD-10-CM | POA: Diagnosis not present

## 2018-05-04 DIAGNOSIS — I2510 Atherosclerotic heart disease of native coronary artery without angina pectoris: Secondary | ICD-10-CM

## 2018-05-04 DIAGNOSIS — I10 Essential (primary) hypertension: Secondary | ICD-10-CM | POA: Diagnosis not present

## 2018-05-04 MED ORDER — METOPROLOL TARTRATE 25 MG PO TABS: 25.0000 mg | ORAL_TABLET | Freq: Two times a day (BID) | ORAL | 3 refills | Status: DC

## 2018-05-04 NOTE — Progress Notes (Signed)
Clinical Summary Ms. Shiffer is a 72 y.o.female female seen today for follow up of the following medical problems.   1. CAD - admit Jan 2019 with unstable angina. - Jan 2019 with distal LM 80%, prox LAD 90%. She was referred for CABG/ s/p LIMA-LAD, SVG-D1, SVG-RI - Jan 2019 echo LVEF 55-60%  - 10/2017 admitted with chest pain. Admitted with NSTEMI - 10/2017 cath with ostial LAD 45%, mid LAD 80%, D1 40%, ramus 20%, LCX 25%, RCA patent. LIMA-LAD patent, SVG-ramus occluded, SVG-D1 99%  -mild infrequent tightness midchest. Can occur with activity, sometimes lifting. Lasts about 2-3 seconds. Started in August, about 1-2 times per month - symptoms improving over time - taking meds daily     2. Hyperlipidemia - 10/2017 TC 79 TG 52 LDL 25 LDL 44 - compliant with statin  3. HTN - she is compliant with meds Past Medical History:  Diagnosis Date  . Arthritis    "hands sometimes" (07/13/2017)  . Coronary artery disease    a. s/p CABG x3 in 06/2017 with LIMA-LAD, SVG-D1, and SVG-RI.  b. 10/2017: cath showing a widely patent LIMA-LAD with ostial occlusion of the SVG-RI and subtotally occluded atretic SVG-D1. Graft occlusion thought to be 2ry to improvement in pre-CABG stenoses.   . Dips tobacco   . Essential hypertension   . Hypothyroid   . Meniere disease   . Obesity   . Osteopenia 01/2012   T score -1.3 FRAX 7.9%/0.6%     Allergies  Allergen Reactions  . Sulfa Antibiotics Shortness Of Breath  . Sulfasalazine Shortness Of Breath     Current Outpatient Medications  Medication Sig Dispense Refill  . acetaminophen (TYLENOL) 325 MG tablet Take 2 tablets (650 mg total) by mouth every 6 (six) hours as needed for mild pain.    Marland Kitchen aspirin EC 81 MG tablet Take 81 mg by mouth daily.    Marland Kitchen atorvastatin (LIPITOR) 80 MG tablet Take 1 tablet (80 mg total) by mouth daily at 6 PM. 90 tablet 3  . Cholecalciferol (VITAMIN D PO) Take 1 tablet by mouth daily.     . clopidogrel (PLAVIX) 75 MG  tablet Take 1 tablet (75 mg total) by mouth daily. 90 tablet 3  . Cyanocobalamin (VITAMIN B-12 PO) Take 1 tablet by mouth daily.    . fish oil-omega-3 fatty acids 1000 MG capsule Take 1 g by mouth daily.    . furosemide (LASIX) 40 MG tablet Take 1 tablet (40 mg total) by mouth daily. 30 tablet 1  . levothyroxine (SYNTHROID, LEVOTHROID) 75 MCG tablet Take 75 mcg by mouth daily.    Marland Kitchen lisinopril (PRINIVIL,ZESTRIL) 2.5 MG tablet Take 1 tablet (2.5 mg total) by mouth daily. 90 tablet 3  . metoprolol tartrate (LOPRESSOR) 25 MG tablet Take 1 tablet (25 mg total) by mouth 2 (two) times daily. 60 tablet 6  . VITAMIN E PO Take 1 tablet by mouth daily.      No current facility-administered medications for this visit.      Past Surgical History:  Procedure Laterality Date  . CARDIAC CATHETERIZATION  07/13/2017  . CATARACT EXTRACTION W/PHACO Right 01/28/2015   Procedure: CATARACT EXTRACTION PHACO AND INTRAOCULAR LENS PLACEMENT :  CDE:  5.70;  Surgeon: Rutherford Guys, MD;  Location: [facility];  Service: Ophthalmology;  Laterality: Right;  . CATARACT EXTRACTION W/PHACO Left 02/11/2015   Procedure: CATARACT EXTRACTION PHACO AND INTRAOCULAR LENS PLACEMENT (IOC);  Surgeon: Rutherford Guys, MD;  Location: [facility];  Service: Ophthalmology;  Laterality: Left;  CDE: 7.38  . COLONOSCOPY N/A 09/26/2012   Procedure: COLONOSCOPY;  Surgeon: Jamesetta So, MD;  Location: [facility];  Service: Gastroenterology;  Laterality: N/A;  . CORONARY ARTERY BYPASS GRAFT N/A 07/15/2017   Procedure: CORONARY ARTERY BYPASS GRAFTING (CABG) X 3 USING LEFT INTERNAL MAMMARY ARTERY AND RIGHT SAPHENOUS VEIN- ENDOSCOPICALLY HARVESTED;  Surgeon: Melrose Nakayama, MD;  Location: [place];  Service: Open Heart Surgery;  Laterality: N/A;  . LEFT HEART CATH AND CORONARY ANGIOGRAPHY N/A 07/13/2017   Procedure: LEFT HEART CATH AND CORONARY ANGIOGRAPHY;  Surgeon: Martinique, Peter M, MD;  Location: [place] [facility];  Service: Cardiovascular;  Laterality: N/A;   . LEFT HEART CATH AND CORS/GRAFTS ANGIOGRAPHY N/A 11/03/2017   Procedure: LEFT HEART CATH AND CORS/GRAFTS ANGIOGRAPHY;  Surgeon: Troy Sine, MD;  Location: [place] [facility];  Service: Cardiovascular;  Laterality: N/A;  . TEE WITHOUT CARDIOVERSION N/A 07/15/2017   Procedure: TRANSESOPHAGEAL ECHOCARDIOGRAM (TEE);  Surgeon: Melrose Nakayama, MD;  Location: [place];  Service: Open Heart Surgery;  Laterality: N/A;  . TUBAL LIGATION    . TYMPANOPLASTY Left    fluid from ear drum     Allergies  Allergen Reactions  . Sulfa Antibiotics Shortness Of Breath  . Sulfasalazine Shortness Of Breath      Family History  Problem Relation Age of Onset  . Diabetes Sister        AODM  . Heart disease Sister 57       CABG  . Heart disease Brother 51       In his 54s  . Cancer Maternal Uncle 60       COLON  . Diabetes Sister        AODM  . Hypertension Daughter   . Hypertension Daughter   . Breast cancer Cousin        PATERNAL COUSIN  . Heart attack Father        In his 56s     Social History Ms. Perezperez reports that she has never smoked. She has quit using smokeless tobacco.  Her smokeless tobacco use included snuff. Ms. Liz reports that she drinks about 1.0 standard drinks of alcohol per week.   Review of Systems CONSTITUTIONAL: No weight loss, fever, chills, weakness or fatigue.  HEENT: Eyes: No visual loss, blurred vision, double vision or yellow sclerae.No hearing loss, sneezing, congestion, runny nose or sore throat.  SKIN: No rash or itching.  CARDIOVASCULAR: per hpi RESPIRATORY: per hpi GASTROINTESTINAL: No anorexia, nausea, vomiting or diarrhea. No abdominal pain or blood.  GENITOURINARY: No burning on urination, no polyuria NEUROLOGICAL: No headache, dizziness, syncope, paralysis, ataxia, numbness or tingling in the extremities. No change in bowel or bladder control.  MUSCULOSKELETAL: No muscle, back pain, joint pain or stiffness.  LYMPHATICS: No enlarged nodes.  No history of splenectomy.  PSYCHIATRIC: No history of depression or anxiety.  ENDOCRINOLOGIC: No reports of sweating, cold or heat intolerance. No polyuria or polydipsia.  Marland Kitchen   Physical Examination Vitals:   05/04/18 1010  BP: 132/84  Pulse: 60  SpO2: 97%   Vitals:   05/04/18 1010  Weight: 197 lb 3.2 oz (89.4 kg)  Height: 5\' 2"  (1.575 m)    Gen: resting comfortably, no acute distress HEENT: no scleral icterus, pupils equal round and reactive, no palptable cervical adenopathy,  CV: RRR, no m/r/g, no jvd Resp: Clear to auscultation bilaterally GI: abdomen is soft, non-tender, non-distended, normal bowel sounds, no hepatosplenomegaly MSK: extremities are  warm, no edema.  Skin: warm, no rash Neuro:  no focal deficits Psych: appropriate affect   Diagnostic Studies Jan 2019 cath  Mid LM lesion is 25% stenosed.  Dist LM to Ost LAD lesion is 80% stenosed.  Ost Cx to Prox Cx lesion is 30% stenosed.  Ost 1st Mrg lesion is 50% stenosed.  Prox LAD lesion is 90% stenosed.  The left ventricular systolic function is normal.  LV end diastolic pressure is normal.  The left ventricular ejection fraction is 55-65% by visual estimate.  1. Single vessel obstructive CAD. Patient has complex ostial and proximal to mid LAD disease that involves the origin of the ramus intermediate and first diagonal respectively. 2. Normal LV function 3. Normal LVEDP  Plan: given complexity of lesions with ostial and bifurcation LAD disease I would recommend consideration for CABG.  Jan 2019 Carotid US Final Interpretation: Right Carotid: Velocities in the right ICA are consistent with a 1-39% stenosis.  Left Carotid: Velocities in the left ICA are consistent with a 1-39% stenosis. Vertebrals: Both vertebral arteries were patent with antegrade flow. Subclavians:  Jan 2019 echo Study Conclusions  - Left ventricle: The cavity size was normal. Wall thickness was increased in a pattern  of mild LVH. Systolic function was normal. The estimated ejection fraction was in the range of 55% to 60%. Wall motion was normal; there were no regional wall motion abnormalities. Doppler parameters are consistent with abnormal left ventricular relaxation (grade 1 diastolic dysfunction). - Mitral valve: Valve area by pressure half-time: 1.26 cm^2.  Impressions:  - Normal LV systolic function; mild LVH; mild diastolic dysfunction.  10/2017 cath  Ost 1st Diag to 1st Diag lesion is 40% stenosed.  Ost Cx lesion is 25% stenosed.  Ost Ramus lesion is 20% stenosed.  Ost LAD lesion is 45% stenosed.  Prox LAD to Mid LAD lesion is 80% stenosed.  Origin lesion is 100% stenosed.  Origin lesion is 99% stenosed.  Origin to Dist Graft lesion is 99% stenosed.  Dist Graft lesion is 100% stenosed.  LV end diastolic pressure is mildly elevated.  The left ventricular systolic function is normal.   Hyperdynamic LV function with an EF of 65% without focal segmental wall motion abnormalities.  Native coronary obstructive disease with 40% ostial LAD stenosis, diffuse 40 to 50% proximal diagonal stenosis with 80% LAD stenosis diffusely after the diagonal takeoff and evidence for competitive filling to the mid LAD via the LIMA graft; 25% ostial smooth narrowing in the ramus intermediate vessel; 25% ostial smooth narrowing in the left circumflex vessel; normal RCA.  Widely patent LIMA graft which supplies the mid LAD.  Totally ostial occlusion of the SVG which had supplied the ramus intermediate vessel.  Subtotally occluded ostial vein graft with diffuse 99% narrowing insistent with an atretic graft  which does not fill the diagonal vessel.  POST CATH RECOMMENDATION: Medical therapy.  I suspect the early vein graft occlusion was contributed by improvement in the prior pre-CABG stenoses.    Assessment and Plan  1. CAD -mild nonspecific symptoms, infrequent and improving -  continue current meds at this time. Continue DAPT until 10/2018 due to medically treated NSTEMI  2. Hyperlipidemia - LDL at goal, continue statin   3. HTN - she is at goal, continue       Arnoldo Lenis, M.D.

## 2018-05-04 NOTE — Patient Instructions (Signed)
Medication Instructions:  Your physician recommends that you continue on your current medications as directed. Please refer to the Current Medication list given to you today.  If you need a refill on your cardiac medications before your next appointment, please call your pharmacy.   Lab work: None If you have labs (blood work) drawn today and your tests are completely normal, you will receive your results only by: Marland Kitchen MyChart Message (if you have MyChart) OR . A paper copy in the mail If you have any lab test that is abnormal or we need to change your treatment, we will call you to review the results.  Testing/Procedures: None  Follow-Up: At [facility], you and your health needs are our priority.  As part of our continuing mission to provide you with exceptional heart care, we have created designated Provider Care Teams.  These Care Teams include your primary Cardiologist (physician) and Advanced Practice Providers (APPs -  Physician Assistants and Nurse Practitioners) who all work together to provide you with the care you need, when you need it. Follow up in 6 months with Dr. Harl Bowie  Any Other Special Instructions Will Be Listed Below (If Applicable). None

## 2018-06-06 DIAGNOSIS — H524 Presbyopia: Secondary | ICD-10-CM | POA: Diagnosis not present

## 2018-06-06 DIAGNOSIS — Z961 Presence of intraocular lens: Secondary | ICD-10-CM | POA: Diagnosis not present

## 2018-06-21 HISTORY — PX: BREAST SURGERY: SHX581

## 2018-07-02 ENCOUNTER — Other Ambulatory Visit: Payer: Self-pay | Admitting: Cardiology

## 2018-07-03 ENCOUNTER — Other Ambulatory Visit (HOSPITAL_COMMUNITY): Payer: Self-pay | Admitting: Internal Medicine

## 2018-07-03 DIAGNOSIS — Z1231 Encounter for screening mammogram for malignant neoplasm of breast: Secondary | ICD-10-CM

## 2018-07-07 ENCOUNTER — Other Ambulatory Visit: Payer: Self-pay | Admitting: Cardiology

## 2018-07-07 ENCOUNTER — Other Ambulatory Visit: Payer: Self-pay | Admitting: Physician Assistant

## 2018-07-12 ENCOUNTER — Ambulatory Visit (HOSPITAL_COMMUNITY)
Admission: RE | Admit: 2018-07-12 | Discharge: 2018-07-12 | Disposition: A | Discharge status: Home / Self Care | Payer: Medicare HMO | Source: Ambulatory Visit | Attending: Internal Medicine | Admitting: Internal Medicine

## 2018-07-12 ENCOUNTER — Encounter (HOSPITAL_COMMUNITY): Payer: Self-pay

## 2018-07-12 DIAGNOSIS — Z1231 Encounter for screening mammogram for malignant neoplasm of breast: Secondary | ICD-10-CM | POA: Insufficient documentation

## 2018-07-13 ENCOUNTER — Telehealth: Payer: Self-pay | Admitting: Cardiology

## 2018-07-13 ENCOUNTER — Other Ambulatory Visit (HOSPITAL_COMMUNITY): Payer: Self-pay | Admitting: Internal Medicine

## 2018-07-13 DIAGNOSIS — R928 Other abnormal and inconclusive findings on diagnostic imaging of breast: Secondary | ICD-10-CM

## 2018-07-13 NOTE — Telephone Encounter (Signed)
Pt is having problems getting her refill from [facility] telling them something is wrong w/ the mg  atorvastatin (LIPITOR) 80 MG tablet [[id number]]

## 2018-07-13 NOTE — Telephone Encounter (Signed)
Dose was increased when she was discharged from the hospital in late 10/2017, should be on 80mg  daily   Zandra Abts MD

## 2018-07-13 NOTE — Telephone Encounter (Signed)
On 10/31/17 appt pt was on 10 mg Lipitor and told to continue current dose. On 11/18/17 pt was on 80 mg Lipitor. Pt has been filling 10 mg lipitor and when she picked up Rx this time noticed they had filled the 80 mg dose. Please advise.

## 2018-07-14 NOTE — Telephone Encounter (Signed)
Left message with pt's husband. He stated he will inform pt to take he lipitor 80 mg daily.

## 2018-07-25 ENCOUNTER — Other Ambulatory Visit (HOSPITAL_COMMUNITY): Payer: Self-pay | Admitting: Internal Medicine

## 2018-07-25 ENCOUNTER — Ambulatory Visit (HOSPITAL_COMMUNITY)
Admission: RE | Admit: 2018-07-25 | Discharge: 2018-07-25 | Disposition: A | Discharge status: Home / Self Care | Payer: Medicare HMO | Source: Ambulatory Visit | Attending: Internal Medicine | Admitting: Internal Medicine

## 2018-07-25 DIAGNOSIS — R928 Other abnormal and inconclusive findings on diagnostic imaging of breast: Secondary | ICD-10-CM | POA: Insufficient documentation

## 2018-07-25 DIAGNOSIS — N6489 Other specified disorders of breast: Secondary | ICD-10-CM | POA: Diagnosis not present

## 2018-08-01 ENCOUNTER — Other Ambulatory Visit (HOSPITAL_COMMUNITY): Payer: Medicare HMO

## 2018-08-01 ENCOUNTER — Ambulatory Visit (HOSPITAL_COMMUNITY)
Admission: RE | Admit: 2018-08-01 | Discharge: 2018-08-01 | Disposition: A | Discharge status: Home / Self Care | Payer: Medicare HMO | Source: Ambulatory Visit | Attending: Internal Medicine | Admitting: Internal Medicine

## 2018-08-01 ENCOUNTER — Encounter (HOSPITAL_COMMUNITY): Payer: Medicare HMO

## 2018-08-01 ENCOUNTER — Other Ambulatory Visit (HOSPITAL_COMMUNITY): Payer: Self-pay | Admitting: Internal Medicine

## 2018-08-01 DIAGNOSIS — R928 Other abnormal and inconclusive findings on diagnostic imaging of breast: Secondary | ICD-10-CM

## 2018-08-01 DIAGNOSIS — C50312 Malignant neoplasm of lower-inner quadrant of left female breast: Secondary | ICD-10-CM | POA: Insufficient documentation

## 2018-08-01 DIAGNOSIS — N6324 Unspecified lump in the left breast, lower inner quadrant: Secondary | ICD-10-CM | POA: Diagnosis not present

## 2018-08-10 DIAGNOSIS — Z171 Estrogen receptor negative status [ER-]: Secondary | ICD-10-CM | POA: Diagnosis not present

## 2018-08-10 DIAGNOSIS — C50512 Malignant neoplasm of lower-outer quadrant of left female breast: Secondary | ICD-10-CM | POA: Diagnosis not present

## 2018-08-14 NOTE — Progress Notes (Signed)
Location of Breast Cancer: Malignant neoplasm of lower outer quadrant of left breast of female, estrogen receptor negative.  Did patient present with symptoms (if so, please note symptoms) or was this found on screening mammography?: Routine screening mammogram.  MRI Breast 08/18/2018:  Screening Mammogram: 7 mm mass in the lower portion of the left breast.  Lymph nodes in her axilla looked normal.  Histology per Pathology Report:   Receptor Status: ER(- 0%), PR (- 0%), Her2-neu (-), Ki-67(40%)   Past/Anticipated interventions by surgeon, if any: Dr. Marlou Starks 08/10/2018 -I have talked to her about the different options for treatment and at this point she favors breast conservation.  I think this is a reasonable way of treating her cancer. -She is also a good candidate for sentinel node mapping. -Given that he cancer is a triple negative she will need an MRI study of both breasts, -With her recent history of myocardial infarction and coronary artery bypass grafting one year ago she will need cardiac clearance. -I will go ahead and make a referral to medical and radiation oncology also since she is triple negative. -She may also be a candidate for genetic testing. -If we get clearance then we will plan for a left breast radioactive seed localized lumpectomy and sentinel node mapping.  Past/Anticipated interventions by medical oncology, if any: Chemotherapy   -Seeing Cardiologist on March 22-23  Lymphedema issues, if any: No     Pain issues, if any: No   BP (!) 158/61 (BP Location: Left Arm, Patient Position: Sitting)   Pulse 65   Temp 98.6 F (37 C) (Oral)   Resp 18   Ht 5' 3" (1.6 m)   Wt 209 lb 9.6 oz (95.1 kg)   SpO2 97%   BMI 37.13 kg/m     Wt Readings from Last 3 Encounters:  08/16/18 209 lb 9.6 oz (95.1 kg)  05/04/18 197 lb 3.2 oz (89.4 kg)  12/07/17 195 lb 12.3 oz (88.8 kg)    SAFETY ISSUES:  Prior radiation? No  Pacemaker/ICD? No  Possible current pregnancy?  No  Is the patient on methotrexate? No  Current Complaints / other details:      Cori Razor, RN 08/14/2018,3:52 PM

## 2018-08-15 ENCOUNTER — Other Ambulatory Visit: Payer: Self-pay | Admitting: General Surgery

## 2018-08-15 DIAGNOSIS — Z170 Estrogen receptor positive status [ER+]: Principal | ICD-10-CM

## 2018-08-15 DIAGNOSIS — C50512 Malignant neoplasm of lower-outer quadrant of left female breast: Secondary | ICD-10-CM

## 2018-08-16 ENCOUNTER — Other Ambulatory Visit: Payer: Self-pay

## 2018-08-16 ENCOUNTER — Ambulatory Visit
Admission: RE | Admit: 2018-08-16 | Discharge: 2018-08-16 | Disposition: A | Discharge status: Home / Self Care | Payer: Medicare HMO | Source: Ambulatory Visit | Attending: Radiation Oncology | Admitting: Radiation Oncology

## 2018-08-16 ENCOUNTER — Telehealth: Payer: Self-pay | Admitting: *Deleted

## 2018-08-16 ENCOUNTER — Encounter: Payer: Self-pay | Admitting: Radiation Oncology

## 2018-08-16 ENCOUNTER — Telehealth: Payer: Self-pay | Admitting: Genetic Counselor

## 2018-08-16 VITALS — BP 158/61 | HR 65 | Temp 98.6°F | Resp 18 | Ht 63.0 in | Wt 209.6 lb

## 2018-08-16 DIAGNOSIS — M8580 Other specified disorders of bone density and structure, unspecified site: Secondary | ICD-10-CM | POA: Insufficient documentation

## 2018-08-16 DIAGNOSIS — E669 Obesity, unspecified: Secondary | ICD-10-CM | POA: Diagnosis not present

## 2018-08-16 DIAGNOSIS — C50512 Malignant neoplasm of lower-outer quadrant of left female breast: Secondary | ICD-10-CM | POA: Diagnosis not present

## 2018-08-16 DIAGNOSIS — M129 Arthropathy, unspecified: Secondary | ICD-10-CM | POA: Insufficient documentation

## 2018-08-16 DIAGNOSIS — Z171 Estrogen receptor negative status [ER-]: Secondary | ICD-10-CM | POA: Diagnosis not present

## 2018-08-16 DIAGNOSIS — Z800 Family history of malignant neoplasm of digestive organs: Secondary | ICD-10-CM | POA: Insufficient documentation

## 2018-08-16 DIAGNOSIS — E039 Hypothyroidism, unspecified: Secondary | ICD-10-CM | POA: Diagnosis not present

## 2018-08-16 DIAGNOSIS — I10 Essential (primary) hypertension: Secondary | ICD-10-CM | POA: Insufficient documentation

## 2018-08-16 DIAGNOSIS — Z7982 Long term (current) use of aspirin: Secondary | ICD-10-CM | POA: Diagnosis not present

## 2018-08-16 DIAGNOSIS — Z79899 Other long term (current) drug therapy: Secondary | ICD-10-CM | POA: Insufficient documentation

## 2018-08-16 DIAGNOSIS — Z87891 Personal history of nicotine dependence: Secondary | ICD-10-CM | POA: Diagnosis not present

## 2018-08-16 DIAGNOSIS — Z803 Family history of malignant neoplasm of breast: Secondary | ICD-10-CM | POA: Diagnosis not present

## 2018-08-16 DIAGNOSIS — Z809 Family history of malignant neoplasm, unspecified: Secondary | ICD-10-CM

## 2018-08-16 NOTE — Telephone Encounter (Signed)
Received a call from [place] in radonc to schedule a genetic counseling appt for the pt. Pt has been scheduled to see Roma Kayser on 3/4 at [place] will notify the pt.

## 2018-08-16 NOTE — Progress Notes (Addendum)
Radiation Oncology         [phone] ________________________________  Name: Gloria Mccoy        MRN: [id number]  Date of Service: 08/16/2018 DOB: 1946/05/15  CC:Redmond School, MD  Jovita Kussmaul, MD     REFERRING PHYSICIAN: Autumn Messing III, MD   DIAGNOSIS: The encounter diagnosis was Malignant neoplasm of lower-outer quadrant of left breast of female, estrogen receptor negative ([place]).   HISTORY OF PRESENT ILLNESS: Gloria Mccoy is a 73 y.o. female seen for a new diagnosis of left breast cancer. The patient was noted to have a possible mass in the posterior inner aspect of the left breast on screening mammogram. She underwent diagnostic imaging which revealed a 7 x 5 x 6 mm mass at 7:00, and her axilla was negative for adenopathy. She underwent a biopsy of the mass on 08/01/2018 which revealed a triple negative, grade 2 invasive ductal carcinoma with a Ki 67 of 40%. She comes today to discuss treatment options of her cancer.     PREVIOUS RADIATION THERAPY: No   PAST MEDICAL HISTORY:  Past Medical History:  Diagnosis Date  . Arthritis    "hands sometimes" (07/13/2017)  . Coronary artery disease    a. s/p CABG x3 in 06/2017 with LIMA-LAD, SVG-D1, and SVG-RI.  b. 10/2017: cath showing a widely patent LIMA-LAD with ostial occlusion of the SVG-RI and subtotally occluded atretic SVG-D1. Graft occlusion thought to be 2ry to improvement in pre-CABG stenoses.   . Dips tobacco   . Essential hypertension   . Hypothyroid   . Meniere disease   . Obesity   . Osteopenia 01/2012   T score -1.3 FRAX 7.9%/0.6%       PAST SURGICAL HISTORY: Past Surgical History:  Procedure Laterality Date  . CARDIAC CATHETERIZATION  07/13/2017  . CATARACT EXTRACTION W/PHACO Right 01/28/2015   Procedure: CATARACT EXTRACTION PHACO AND INTRAOCULAR LENS PLACEMENT :  CDE:  5.70;  Surgeon: Rutherford Guys, MD;  Location: [facility];  Service: Ophthalmology;  Laterality: Right;  . CATARACT EXTRACTION W/PHACO Left  02/11/2015   Procedure: CATARACT EXTRACTION PHACO AND INTRAOCULAR LENS PLACEMENT (IOC);  Surgeon: Rutherford Guys, MD;  Location: [facility];  Service: Ophthalmology;  Laterality: Left;  CDE: 7.38  . COLONOSCOPY N/A 09/26/2012   Procedure: COLONOSCOPY;  Surgeon: Jamesetta So, MD;  Location: [facility];  Service: Gastroenterology;  Laterality: N/A;  . CORONARY ARTERY BYPASS GRAFT N/A 07/15/2017   Procedure: CORONARY ARTERY BYPASS GRAFTING (CABG) X 3 USING LEFT INTERNAL MAMMARY ARTERY AND RIGHT SAPHENOUS VEIN- ENDOSCOPICALLY HARVESTED;  Surgeon: Melrose Nakayama, MD;  Location: [facility];  Service: Open Heart Surgery;  Laterality: N/A;  . LEFT HEART CATH AND CORONARY ANGIOGRAPHY N/A 07/13/2017   Procedure: LEFT HEART CATH AND CORONARY ANGIOGRAPHY;  Surgeon: Martinique, Peter M, MD;  Location: [facility];  Service: Cardiovascular;  Laterality: N/A;  . LEFT HEART CATH AND CORS/GRAFTS ANGIOGRAPHY N/A 11/03/2017   Procedure: LEFT HEART CATH AND CORS/GRAFTS ANGIOGRAPHY;  Surgeon: Troy Sine, MD;  Location: [facility];  Service: Cardiovascular;  Laterality: N/A;  . TEE WITHOUT CARDIOVERSION N/A 07/15/2017   Procedure: TRANSESOPHAGEAL ECHOCARDIOGRAM (TEE);  Surgeon: Melrose Nakayama, MD;  Location: [facility];  Service: Open Heart Surgery;  Laterality: N/A;  . TUBAL LIGATION    . TYMPANOPLASTY Left    fluid from ear drum     FAMILY HISTORY:  Family History  Problem Relation Age of Onset  . Diabetes Sister  AODM  . Heart disease Sister 33       CABG  . Heart disease Brother 16       In his 9s  . Cancer Maternal Uncle 60       COLON  . Diabetes Sister        AODM  . Hypertension Daughter   . Hypertension Daughter   . Breast cancer Cousin        PATERNAL COUSIN  . Heart attack Father        In his 25s     SOCIAL HISTORY:  reports that she has never smoked. She has quit using smokeless tobacco.  Her smokeless tobacco use included snuff. She reports current alcohol use of  about 1.0 standard drinks of alcohol per week. She reports that she does not use drugs. The patient is married and lives in [place]. She is accompanied by her daughter. The patient reports that her sister is another patient of Dr. Ida Rogue.   ALLERGIES: Sulfa antibiotics and Sulfasalazine   MEDICATIONS:  Current Outpatient Medications  Medication Sig Dispense Refill  . acetaminophen (TYLENOL) 325 MG tablet Take 2 tablets (650 mg total) by mouth every 6 (six) hours as needed for mild pain.    Marland Kitchen aspirin EC 81 MG tablet Take 81 mg by mouth daily.    Marland Kitchen atorvastatin (LIPITOR) 80 MG tablet Take 1 tablet (80 mg total) by mouth daily at 6 PM. 90 tablet 3  . Cholecalciferol (VITAMIN D PO) Take 1 tablet by mouth daily.     . clopidogrel (PLAVIX) 75 MG tablet Take 1 tablet (75 mg total) by mouth daily. 90 tablet 3  . Cyanocobalamin (VITAMIN B-12 PO) Take 1 tablet by mouth daily.    . fish oil-omega-3 fatty acids 1000 MG capsule Take 1 g by mouth daily.    . furosemide (LASIX) 40 MG tablet Take 1 tablet (40 mg total) by mouth daily. 30 tablet 1  . levothyroxine (SYNTHROID, LEVOTHROID) 75 MCG tablet Take 75 mcg by mouth daily.    Marland Kitchen lisinopril (PRINIVIL,ZESTRIL) 2.5 MG tablet Take 1 tablet (2.5 mg total) by mouth daily. 90 tablet 3  . metoprolol tartrate (LOPRESSOR) 25 MG tablet Take 1 tablet (25 mg total) by mouth 2 (two) times daily. 180 tablet 3  . VITAMIN E PO Take 1 tablet by mouth daily.      No current facility-administered medications for this encounter.      REVIEW OF SYSTEMS: On review of systems, the patient reports that she is doing well overall. She denies any chest pain, shortness of breath, cough, fevers, chills, night sweats, unintended weight changes. She denies any bowel or bladder disturbances, and denies abdominal pain, nausea or vomiting. She denies any new musculoskeletal or joint aches or pains. A complete review of systems is obtained and is otherwise negative.     PHYSICAL  EXAM:  Wt Readings from Last 3 Encounters:  05/04/18 197 lb 3.2 oz (89.4 kg)  12/07/17 195 lb 12.3 oz (88.8 kg)  11/18/17 196 lb 3.2 oz (89 kg)   Temp Readings from Last 3 Encounters:  11/03/17 97.7 F (36.5 C) (Oral)  07/20/17 98.6 F (37 C) (Oral)  02/11/15 97.5 F (36.4 C) (Oral)   BP Readings from Last 3 Encounters:  05/04/18 132/84  11/18/17 136/66  11/03/17 (!) 141/57   Pulse Readings from Last 3 Encounters:  05/04/18 60  11/18/17 65  11/03/17 64     In general this is a well appearing  African American female in no acute distress. She is alert and oriented x4 and appropriate throughout the examination. HEENT reveals that the patient is normocephalic, atraumatic. Cardiopulmonary assessment is negative for acute distress and she exhibits normal effort. Breast exam is deferred.   ECOG = 0  0 - Asymptomatic (Fully active, able to carry on all predisease activities without restriction)  1 - Symptomatic but completely ambulatory (Restricted in physically strenuous activity but ambulatory and able to carry out work of a light or sedentary nature. For example, light housework, office work)  2 - Symptomatic, <50% in bed during the day (Ambulatory and capable of all self care but unable to carry out any work activities. Up and about more than 50% of waking hours)  3 - Symptomatic, >50% in bed, but not bedbound (Capable of only limited self-care, confined to bed or chair 50% or more of waking hours)  4 - Bedbound (Completely disabled. Cannot carry on any self-care. Totally confined to bed or chair)  5 - Death   Eustace Pen MM, Creech RH, Tormey DC, et al. [phone]). "Toxicity and response criteria of the [facility]". [place] Oncol. 5 (6): 649-55    LABORATORY DATA:  Lab Results  Component Value Date   WBC 9.3 11/02/2017   HGB 13.1 11/02/2017   HCT 38.2 11/02/2017   MCV 80.1 11/02/2017   PLT 199 11/02/2017   Lab Results  Component Value Date   NA  140 11/02/2017   K 4.5 11/02/2017   CL 102 11/02/2017   CO2 29 11/02/2017   Lab Results  Component Value Date   ALT 14 11/18/2017   AST 18 11/18/2017   ALKPHOS 65 11/18/2017   BILITOT 0.7 11/18/2017      RADIOGRAPHY: US Breast Ltd Uni Left Inc Axilla  Result Date: 07/25/2018 CLINICAL DATA:  Screening recall for possible left breast mass. EXAM: DIGITAL DIAGNOSTIC UNILATERAL LEFT MAMMOGRAM WITH CAD AND TOMO LEFT BREAST ULTRASOUND COMPARISON:  Previous exam(s). ACR Breast Density Category b: There are scattered areas of fibroglandular density. FINDINGS: Spot compression CC and MLO tomograms were performed of the left breast. There is a mass with indistinct margins in the lower inner posterior left breast measuring approximately 0.6 cm. Mammographic images were processed with CAD. Targeted ultrasound of the lower left breast was performed. There is a hypoechoic mass with margin irregularity in the left breast at 7 o'clock 7 cm from nipple measuring 0.7 x 0.5 by 0.6 cm. No lymphadenopathy seen in the left axilla. IMPRESSION: Suspicious left breast mass. RECOMMENDATION: Ultrasound-guided biopsy of the mass in the left breast at the 7 o'clock position is recommended. This will be scheduled for the patient. I have discussed the findings and recommendations with the patient. Results were also provided in writing at the conclusion of the visit. If applicable, a reminder letter will be sent to the patient regarding the next appointment. BI-RADS CATEGORY  4: Suspicious. Electronically Signed   By: Everlean Alstrom M.D.   On: 07/25/2018 14:52   Mm Diag Breast Tomo Uni Left  Result Date: 07/25/2018 CLINICAL DATA:  Screening recall for possible left breast mass. EXAM: DIGITAL DIAGNOSTIC UNILATERAL LEFT MAMMOGRAM WITH CAD AND TOMO LEFT BREAST ULTRASOUND COMPARISON:  Previous exam(s). ACR Breast Density Category b: There are scattered areas of fibroglandular density. FINDINGS: Spot compression CC and MLO tomograms  were performed of the left breast. There is a mass with indistinct margins in the lower inner posterior left breast measuring approximately 0.6 cm.  Mammographic images were processed with CAD. Targeted ultrasound of the lower left breast was performed. There is a hypoechoic mass with margin irregularity in the left breast at 7 o'clock 7 cm from nipple measuring 0.7 x 0.5 by 0.6 cm. No lymphadenopathy seen in the left axilla. IMPRESSION: Suspicious left breast mass. RECOMMENDATION: Ultrasound-guided biopsy of the mass in the left breast at the 7 o'clock position is recommended. This will be scheduled for the patient. I have discussed the findings and recommendations with the patient. Results were also provided in writing at the conclusion of the visit. If applicable, a reminder letter will be sent to the patient regarding the next appointment. BI-RADS CATEGORY  4: Suspicious. Electronically Signed   By: Everlean Alstrom M.D.   On: 07/25/2018 14:52   Mm Clip Placement Left  Result Date: 08/01/2018 CLINICAL DATA:  Post ultrasound-guided biopsy of an indeterminate mass in the left breast at 7 o'clock 7 cm from nipple. EXAM: DIAGNOSTIC LEFT MAMMOGRAM POST ULTRASOUND BIOPSY COMPARISON:  Previous exam(s). FINDINGS: Mammographic images were obtained following ultrasound guided biopsy of a mass in the left breast at the 7 o'clock position. A ribbon shaped biopsy marking clip is positioned at the site of the biopsied mass in the left breast at the 7 o'clock position. IMPRESSION: Ribbon shaped biopsy marking clip at site of biopsied mass in the left breast at the 7 position. Final Assessment: Post Procedure Mammograms for Marker Placement Electronically Signed   By: Everlean Alstrom M.D.   On: 08/01/2018 08:58   [place] Lt Breast Bx W Loc Dev 1st Lesion Img Bx Spec US Guide  Addendum Date: 08/02/2018   ADDENDUM REPORT: 08/02/2018 16:45 ADDENDUM: Pathology of the left breast biopsy revealed INVASIVE DUCTAL CARCINOMA, GRADE  II. This was found to be concordant by Dr. Shelly Bombard. Recommendation: Surgical referral. At the patient's request, results and recommendations were relayed to the patient by phone by Jetta Lout, [place] on 08/02/2018. The patient stated she did well following the biopsy with no bleeding, bruising, or pain. Post biopsy instructions were reviewed with the patient and all of her questions were answered. Request for referral was relayed to Kathi Der, RT at [facility]. She will contact the patient and surgical referral will be placed. The patient was encouraged to contact Jetta Lout, [place] or the imaging department of [facility] with any further questions or concerns. Addendum by Jetta Lout, RRA on 08/02/2018 Electronically Signed   By: Everlean Alstrom M.D.   On: 08/02/2018 16:45   Result Date: 08/02/2018 CLINICAL DATA:  73 year old female with an indeterminate mass in the left breast at 7 o'clock 7 cm from nipple. EXAM: ULTRASOUND GUIDED LEFT BREAST CORE NEEDLE BIOPSY COMPARISON:  Previous exam(s). FINDINGS: I met with the patient and we discussed the procedure of ultrasound-guided biopsy, including benefits and alternatives. We discussed the high likelihood of a successful procedure. We discussed the risks of the procedure, including infection, bleeding, tissue injury, clip migration, and inadequate sampling. Informed written consent was given. The usual time-out protocol was performed immediately prior to the procedure. Lesion quadrant: Lower inner Using sterile technique and 1% Lidocaine as local anesthetic, under direct ultrasound visualization, a 14 gauge spring-loaded device was used to perform biopsy of the mass in the left breast at the 7 o'clock position using a medial to lateral approach. At the conclusion of the procedure a ribbon shaped tissue marker clip was deployed into the biopsy cavity. Follow up 2 view mammogram was performed and dictated  separately. IMPRESSION: Ultrasound guided  biopsy of the mass in the left breast at the 7 o'clock position. No apparent complications. Electronically Signed: By: Everlean Alstrom M.D. On: 08/01/2018 09:00       IMPRESSION/PLAN: 1. Stage IB, [id number] grade 2 triple negative invasive ductal carcinoma of the left breast. Dr. Lisbeth Renshaw discusses the pathology findings and reviews the nature of triple negative breast disease. She is awaiting cardiac clearance and is anticipating breast conservation with lumpectomy with sentinel node biopsy. Given the triple negative component, she would likely benefit from adjuvant chemotherapy. She has yet to meet with medical oncology and we will try to coordinate a referral as she desires to be seen here in [place]. She would benefit later on for external radiotherapy to the breast. We discussed the risks, benefits, short, and long term effects of radiotherapy, and the patient is interested in proceeding at the appropriate time. Dr. Lisbeth Renshaw discusses the delivery and logistics of radiotherapy and anticipates a course of 4 or up to 6 1/2 weeks of radiotherapy. We will see her back about a few weeks after completing chemotherapy. We will also notify the breast navigators of her case. 2. Possible genetic predisposition to malignancy. The patient is a candidate for genetic testing given her personal and family history. She was offered referral and is interested in meeting. A referral was sent in.    In a visit lasting 60 minutes, greater than 50% of the time was spent face to face discussing her case, and coordinating the patient's care.  The above documentation reflects my direct findings during this shared patient visit. Please see the separate note by Dr. Lisbeth Renshaw on this date for the remainder of the patient's plan of care.    Carola Rhine, PAC

## 2018-08-16 NOTE — Telephone Encounter (Signed)
Called patient to inform of genetics appt. on 08-23-18 @ 9 am with Roma Kayser, lvm for a return call

## 2018-08-17 ENCOUNTER — Encounter: Payer: Self-pay | Admitting: General Practice

## 2018-08-17 NOTE — Progress Notes (Signed)
[place] Psychosocial Distress Screening Clinical Social Work  Clinical Social Work was referred by distress screening protocol.  The patient scored a 9 on the Psychosocial Distress Thermometer which indicates severe distress. Clinical Social Worker contacted patient by phone to assess for distress and other psychosocial needs. Unable to reach patient, left VM w information about support Center, contact information and encouragement to call for support/resources as needed.    ONCBCN DISTRESS SCREENING 08/16/2018  Screening Type Initial Screening  Distress experienced in past week (1-10) 9  Family Problem type Partner  Emotional problem type Adjusting to illness  Information Concerns Type Lack of info about diagnosis;Lack of info about treatment  Physical Problem type Breathing;Constipation/diarrhea;Skin dry/itchy;Swollen arms/legs    Clinical Social Worker follow up needed: No.  If yes, follow up plan:  Beverely Pace, [place], LCSW Clinical Social Worker Phone:  [phone]

## 2018-08-18 ENCOUNTER — Encounter: Payer: Self-pay | Admitting: Oncology

## 2018-08-18 ENCOUNTER — Other Ambulatory Visit: Payer: Medicare HMO

## 2018-08-18 ENCOUNTER — Telehealth: Payer: Self-pay | Admitting: Oncology

## 2018-08-18 NOTE — Telephone Encounter (Signed)
A new patient appt has been scheduled for the pt to see Dr. Jana Hakim on 3/5 at 4pm. Pt has cld and confirmed the appt.

## 2018-08-22 ENCOUNTER — Ambulatory Visit: Payer: Medicare HMO | Admitting: Hematology and Oncology

## 2018-08-22 ENCOUNTER — Ambulatory Visit: Payer: Medicare HMO | Admitting: Cardiology

## 2018-08-22 ENCOUNTER — Encounter: Payer: Self-pay | Admitting: Cardiology

## 2018-08-22 VITALS — BP 140/72 | HR 65 | Ht 63.0 in | Wt 208.0 lb

## 2018-08-22 DIAGNOSIS — I2510 Atherosclerotic heart disease of native coronary artery without angina pectoris: Secondary | ICD-10-CM

## 2018-08-22 DIAGNOSIS — I10 Essential (primary) hypertension: Secondary | ICD-10-CM

## 2018-08-22 DIAGNOSIS — E782 Mixed hyperlipidemia: Secondary | ICD-10-CM | POA: Diagnosis not present

## 2018-08-22 DIAGNOSIS — Z01810 Encounter for preprocedural cardiovascular examination: Secondary | ICD-10-CM | POA: Diagnosis not present

## 2018-08-22 MED ORDER — LOSARTAN POTASSIUM 25 MG PO TABS: 12.5000 mg | ORAL_TABLET | Freq: Every day | ORAL | 3 refills | Status: DC

## 2018-08-22 NOTE — Patient Instructions (Signed)
Medication Instructions: START Losartan 12.5 mg daily  Labwork: None today  Procedures/Testing: None today  Follow-Up: 6 months with Dr.Branch  Any Additional Special Instructions Will Be Listed Below (If Applicable).     If you need a refill on your cardiac medications before your next appointment, please call your pharmacy.

## 2018-08-22 NOTE — Progress Notes (Signed)
Clinical Summary Gloria Mccoy is a 73 y.o.female seen today for follow up of the following medical problems.  1. CAD - admit Jan 2019 with unstable angina. - Jan 2019 with distal LM 80%, prox LAD 90%. She was referred for CABG/ s/p LIMA-LAD, SVG-D1, SVG-RI - Jan 2019 echo LVEF 55-60%  - 10/2017 admitted with chest pain. Admitted with NSTEMI - 10/2017 cath with ostial LAD 45%, mid LAD 80%, D1 40%, ramus 20%, LCX 25%, RCA patent. LIMA-LAD patent, SVG-ramus occluded, SVG-D1 99%. Recs for medical therapy   - sedentary lifestyle. DOE with 1 flight of stairs which is chronic and unchanged since her MI last year - compliant with meds, though she stopped lisinopril because was worreid about developing a cough similar to her other family members, though she herself never had symptoms    2. Hyperlipidemia - 10/2017 TC 79 TG 52 LDL 25 LDL 44 - labs followed by pcp  3. HTN - she stopped lisinopril on her own   4. Preoperative evaluation - being considered for breast lumpectomy  Past Medical History:  Diagnosis Date  . Arthritis    "hands sometimes" (07/13/2017)  . Coronary artery disease    a. s/p CABG x3 in 06/2017 with LIMA-LAD, SVG-D1, and SVG-RI.  b. 10/2017: cath showing a widely patent LIMA-LAD with ostial occlusion of the SVG-RI and subtotally occluded atretic SVG-D1. Graft occlusion thought to be 2ry to improvement in pre-CABG stenoses.   . Dips tobacco   . Essential hypertension   . Hypothyroid   . Meniere disease   . Obesity   . Osteopenia 01/2012   T score -1.3 FRAX 7.9%/0.6%     Allergies  Allergen Reactions  . Sulfa Antibiotics Shortness Of Breath  . Sulfasalazine Shortness Of Breath     Current Outpatient Medications  Medication Sig Dispense Refill  . acetaminophen (TYLENOL) 325 MG tablet Take 2 tablets (650 mg total) by mouth every 6 (six) hours as needed for mild pain.    Marland Kitchen aspirin EC 81 MG tablet Take 81 mg by mouth daily.    Marland Kitchen atorvastatin  (LIPITOR) 80 MG tablet Take 1 tablet (80 mg total) by mouth daily at 6 PM. 90 tablet 3  . Cholecalciferol (VITAMIN D PO) Take 1 tablet by mouth daily.     . clopidogrel (PLAVIX) 75 MG tablet Take 1 tablet (75 mg total) by mouth daily. 90 tablet 3  . Cyanocobalamin (VITAMIN B-12 PO) Take 1 tablet by mouth daily.    . fish oil-omega-3 fatty acids 1000 MG capsule Take 1 g by mouth daily.    . furosemide (LASIX) 40 MG tablet Take 1 tablet (40 mg total) by mouth daily. 30 tablet 1  . levothyroxine (SYNTHROID, LEVOTHROID) 75 MCG tablet Take 75 mcg by mouth daily.    . metoprolol tartrate (LOPRESSOR) 25 MG tablet Take 1 tablet (25 mg total) by mouth 2 (two) times daily. 180 tablet 3  . VITAMIN E PO Take 1 tablet by mouth daily.      No current facility-administered medications for this visit.      Past Surgical History:  Procedure Laterality Date  . CARDIAC CATHETERIZATION  07/13/2017  . CATARACT EXTRACTION W/PHACO Right 01/28/2015   Procedure: CATARACT EXTRACTION PHACO AND INTRAOCULAR LENS PLACEMENT :  CDE:  5.70;  Surgeon: Rutherford Guys, MD;  [facility]: [facility];  Service: Ophthalmology;  Laterality: Right;  . CATARACT EXTRACTION W/PHACO Left 02/11/2015   Procedure: CATARACT EXTRACTION PHACO AND INTRAOCULAR LENS PLACEMENT (IOC);  Surgeon: Rutherford Guys, MD;  Location: [facility];  Service: Ophthalmology;  Laterality: Left;  CDE: 7.38  . COLONOSCOPY N/A 09/26/2012   Procedure: COLONOSCOPY;  Surgeon: Jamesetta So, MD;  [facility];  Service: Gastroenterology;  Laterality: N/A;  . CORONARY ARTERY BYPASS GRAFT N/A 07/15/2017   Procedure: CORONARY ARTERY BYPASS GRAFTING (CABG) X 3 USING LEFT INTERNAL MAMMARY ARTERY AND RIGHT SAPHENOUS VEIN- ENDOSCOPICALLY HARVESTED;  Surgeon: Melrose Nakayama, MD;  Location: [place];  Service: Open Heart Surgery;  Laterality: N/A;  . LEFT HEART CATH AND CORONARY ANGIOGRAPHY N/A 07/13/2017   Procedure: LEFT HEART CATH AND CORONARY ANGIOGRAPHY;  Surgeon: Martinique, Peter  M, MD;  Location: [facility];  Service: Cardiovascular;  Laterality: N/A;  . LEFT HEART CATH AND CORS/GRAFTS ANGIOGRAPHY N/A 11/03/2017   Procedure: LEFT HEART CATH AND CORS/GRAFTS ANGIOGRAPHY;  Surgeon: Troy Sine, MD;  Location: [facility];  Service: Cardiovascular;  Laterality: N/A;  . TEE WITHOUT CARDIOVERSION N/A 07/15/2017   Procedure: TRANSESOPHAGEAL ECHOCARDIOGRAM (TEE);  Surgeon: Melrose Nakayama, MD;  Location: [facility];  Service: Open Heart Surgery;  Laterality: N/A;  . TUBAL LIGATION    . TYMPANOPLASTY Left    fluid from ear drum     Allergies  Allergen Reactions  . Sulfa Antibiotics Shortness Of Breath  . Sulfasalazine Shortness Of Breath      Family History  Problem Relation Age of Onset  . Diabetes Sister        AODM  . Heart disease Sister 18       CABG  . Breast cancer Sister   . Heart disease Brother 75       In his 45s  . Cancer Maternal Uncle 60       COLON  . Diabetes Sister        AODM  . Hypertension Daughter   . Hypertension Daughter   . Breast cancer Cousin        PATERNAL COUSIN  . Heart attack Father        In his 23s     Social History Gloria Mccoy reports that she has quit smoking. She has quit using smokeless tobacco.  Her smokeless tobacco use included snuff. Gloria Mccoy reports current alcohol use of about 1.0 standard drinks of alcohol per week.   Review of Systems CONSTITUTIONAL: No weight loss, fever, chills, weakness or fatigue.  HEENT: Eyes: No visual loss, blurred vision, double vision or yellow sclerae.No hearing loss, sneezing, congestion, runny nose or sore throat.  SKIN: No rash or itching.  CARDIOVASCULAR: per hpi RESPIRATORY: per hpi GASTROINTESTINAL: No anorexia, nausea, vomiting or diarrhea. No abdominal pain or blood.  GENITOURINARY: No burning on urination, no polyuria NEUROLOGICAL: No headache, dizziness, syncope, paralysis, ataxia, numbness or tingling in the extremities. No change in bowel or  bladder control.  MUSCULOSKELETAL: No muscle, back pain, joint pain or stiffness.  LYMPHATICS: No enlarged nodes. No history of splenectomy.  PSYCHIATRIC: No history of depression or anxiety.  ENDOCRINOLOGIC: No reports of sweating, cold or heat intolerance. No polyuria or polydipsia.  Marland Kitchen   Physical Examination Vitals:   08/22/18 1331  BP: 140/72  Pulse: 65  SpO2: 94%   Vitals:   08/22/18 1331  Weight: 208 lb (94.3 kg)  Height: 5\' 3"  (1.6 m)    Gen: resting comfortably, no acute distress HEENT: no scleral icterus, pupils equal round and reactive, no palptable cervical adenopathy,  CV: RRR, no m/r/g, no jvd Resp: Clear  to auscultation bilaterally GI: abdomen is soft, non-tender, non-distended, normal bowel sounds, no hepatosplenomegaly MSK: extremities are warm, no edema.  Skin: warm, no rash Neuro:  no focal deficits Psych: appropriate affect   Diagnostic Studies  Jan 2019 cath  Mid LM lesion is 25% stenosed.  Dist LM to Ost LAD lesion is 80% stenosed.  Ost Cx to Prox Cx lesion is 30% stenosed.  Ost 1st Mrg lesion is 50% stenosed.  Prox LAD lesion is 90% stenosed.  The left ventricular systolic function is normal.  LV end diastolic pressure is normal.  The left ventricular ejection fraction is 55-65% by visual estimate.  1. Single vessel obstructive CAD. Patient has complex ostial and proximal to mid LAD disease that involves the origin of the ramus intermediate and first diagonal respectively. 2. Normal LV function 3. Normal LVEDP  Plan: given complexity of lesions with ostial and bifurcation LAD disease I would recommend consideration for CABG.  Jan 2019 Carotid US Final Interpretation: Right Carotid: Velocities in the right ICA are consistent with a 1-39% stenosis.  Left Carotid: Velocities in the left ICA are consistent with a 1-39% stenosis. Vertebrals: Both vertebral arteries were patent with antegrade flow. Subclavians:  Jan 2019  echo Study Conclusions  - Left ventricle: The cavity size was normal. Wall thickness was increased in a pattern of mild LVH. Systolic function was normal. The estimated ejection fraction was in the range of 55% to 60%. Wall motion was normal; there were no regional wall motion abnormalities. Doppler parameters are consistent with abnormal left ventricular relaxation (grade 1 diastolic dysfunction). - Mitral valve: Valve area by pressure half-time: 1.26 cm^2.  Impressions:  - Normal LV systolic function; mild LVH; mild diastolic dysfunction.  10/2017 cath  Ost 1st Diag to 1st Diag lesion is 40% stenosed.  Ost Cx lesion is 25% stenosed.  Ost Ramus lesion is 20% stenosed.  Ost LAD lesion is 45% stenosed.  Prox LAD to Mid LAD lesion is 80% stenosed.  Origin lesion is 100% stenosed.  Origin lesion is 99% stenosed.  Origin to Dist Graft lesion is 99% stenosed.  Dist Graft lesion is 100% stenosed.  LV end diastolic pressure is mildly elevated.  The left ventricular systolic function is normal.  Hyperdynamic LV function with an EF of 65% without focal segmental wall motion abnormalities.  Native coronary obstructive disease with 40% ostial LAD stenosis, diffuse 40 to 50% proximal diagonal stenosis with 80% LAD stenosis diffusely after the diagonal takeoff and evidence for competitive filling to the mid LAD via the LIMA graft; 25% ostial smooth narrowing in the ramus intermediate vessel; 25% ostial smooth narrowing in the left circumflex vessel; normal RCA.  Widely patent LIMA graft which supplies the mid LAD.  Totally ostial occlusion of the SVG which had supplied the ramus intermediate vessel.  Subtotally occluded ostial vein graft with diffuse 99% narrowing insistent with an atretic graft which does not fill the diagonal vessel.  POST CATH RECOMMENDATION: Medical therapy. I suspect the early vein graft occlusion was contributed by improvement in the  prior pre-CABG stenoses.   Assessment and Plan  1. CAD -chronic stable SOB/DOE since her MI last year - continue DAPT at least until 10/2018 if not longer  2. Hyperlipidemia - continue statin, request labs from pcp   3. HTN -she refused to take lisinopril, never had a cough but worried about getting one like several of her famiyl members - start losartan 12.5mg  daily  4. Preoperative evaluation - ok to proceed  with lumpectomy. Chronic stable SOB/DOE since her MI last year, no progression or new symptoms - hold plavix 5 days prior to surgery, resume day after  F/u 6 months  Arnoldo Lenis, M.D.

## 2018-08-23 ENCOUNTER — Inpatient Hospital Stay: Payer: Medicare HMO

## 2018-08-23 ENCOUNTER — Inpatient Hospital Stay: Payer: Medicare HMO | Attending: Genetic Counselor | Admitting: Genetic Counselor

## 2018-08-23 ENCOUNTER — Encounter: Payer: Self-pay | Admitting: Genetic Counselor

## 2018-08-23 ENCOUNTER — Other Ambulatory Visit: Payer: Medicare HMO

## 2018-08-23 DIAGNOSIS — C50312 Malignant neoplasm of lower-inner quadrant of left female breast: Secondary | ICD-10-CM | POA: Insufficient documentation

## 2018-08-23 DIAGNOSIS — Z8249 Family history of ischemic heart disease and other diseases of the circulatory system: Secondary | ICD-10-CM | POA: Insufficient documentation

## 2018-08-23 DIAGNOSIS — Z803 Family history of malignant neoplasm of breast: Secondary | ICD-10-CM | POA: Diagnosis not present

## 2018-08-23 DIAGNOSIS — Z171 Estrogen receptor negative status [ER-]: Secondary | ICD-10-CM

## 2018-08-23 DIAGNOSIS — Z7982 Long term (current) use of aspirin: Secondary | ICD-10-CM | POA: Insufficient documentation

## 2018-08-23 DIAGNOSIS — M8580 Other specified disorders of bone density and structure, unspecified site: Secondary | ICD-10-CM | POA: Insufficient documentation

## 2018-08-23 DIAGNOSIS — Z79899 Other long term (current) drug therapy: Secondary | ICD-10-CM | POA: Insufficient documentation

## 2018-08-23 DIAGNOSIS — Z951 Presence of aortocoronary bypass graft: Secondary | ICD-10-CM | POA: Insufficient documentation

## 2018-08-23 DIAGNOSIS — Z800 Family history of malignant neoplasm of digestive organs: Secondary | ICD-10-CM

## 2018-08-23 DIAGNOSIS — C50512 Malignant neoplasm of lower-outer quadrant of left female breast: Secondary | ICD-10-CM

## 2018-08-23 DIAGNOSIS — I10 Essential (primary) hypertension: Secondary | ICD-10-CM | POA: Insufficient documentation

## 2018-08-23 DIAGNOSIS — I2510 Atherosclerotic heart disease of native coronary artery without angina pectoris: Secondary | ICD-10-CM | POA: Insufficient documentation

## 2018-08-23 DIAGNOSIS — Z87891 Personal history of nicotine dependence: Secondary | ICD-10-CM | POA: Insufficient documentation

## 2018-08-23 NOTE — Progress Notes (Signed)
REFERRING PROVIDER: Chauncey Cruel, MD [address] [place], [place] [address]  PRIMARY PROVIDER:  Redmond School, MD  PRIMARY REASON FOR VISIT:  1. Malignant neoplasm of lower-outer quadrant of left breast of female, estrogen receptor negative ([place])   2. Family history of breast cancer   3. Family history of colon cancer      HISTORY OF PRESENT ILLNESS:   Ms. Ballman, a 73 y.o. female, was seen for a [facility] cancer genetics consultation at the request of Dr. Jana Hakim due to a personal and family history of cancer.  Ms. Gellerman presents to clinic today to discuss the possibility of a hereditary predisposition to cancer, genetic testing, and to further clarify her future cancer risks, as well as potential cancer risks for family members.   In February 2020, at the age of 89, Ms. Crader was diagnosed with invasive ductal carcinoma of the left breast. She has not met with all of her care providers yet, but has discussed treatment with surgery and radiology.  Her tumor is triple negative.    CANCER HISTORY:   No history exists.     HORMONAL RISK FACTORS:  Menarche was at age 20.  First live birth at age 35.  OCP use for approximately 2 years.  Ovaries intact: yes.  Hysterectomy: no.  Menopausal status: postmenopausal.  HRT use: 0 years. Colonoscopy: yes; normal. Mammogram within the last year: yes. Number of breast biopsies: 1. Up to date with pelvic exams:  n/a. Any excessive radiation exposure in the past:  no  Past Medical History:  Diagnosis Date  . Arthritis    "hands sometimes" (07/13/2017)  . Coronary artery disease    a. s/p CABG x3 in 06/2017 with LIMA-LAD, SVG-D1, and SVG-RI.  b. 10/2017: cath showing a widely patent LIMA-LAD with ostial occlusion of the SVG-RI and subtotally occluded atretic SVG-D1. Graft occlusion thought to be 2ry to improvement in pre-CABG stenoses.   . Dips tobacco   . Essential hypertension   . Family history of breast cancer    . Family history of colon cancer   . Hypothyroid   . Meniere disease   . Obesity   . Osteopenia 01/2012   T score -1.3 FRAX 7.9%/0.6%    Past Surgical History:  Procedure Laterality Date  . CARDIAC CATHETERIZATION  07/13/2017  . CATARACT EXTRACTION W/PHACO Right 01/28/2015   Procedure: CATARACT EXTRACTION PHACO AND INTRAOCULAR LENS PLACEMENT :  CDE:  5.70;  Surgeon: Rutherford Guys, MD;  [facility];  Service: Ophthalmology;  Laterality: Right;  . CATARACT EXTRACTION W/PHACO Left 02/11/2015   Procedure: CATARACT EXTRACTION PHACO AND INTRAOCULAR LENS PLACEMENT (IOC);  Surgeon: Rutherford Guys, MD;  [facility];  Service: Ophthalmology;  Laterality: Left;  CDE: 7.38  . COLONOSCOPY N/A 09/26/2012   Procedure: COLONOSCOPY;  Surgeon: Jamesetta So, MD;  [facility];  Service: Gastroenterology;  Laterality: N/A;  . CORONARY ARTERY BYPASS GRAFT N/A 07/15/2017   Procedure: CORONARY ARTERY BYPASS GRAFTING (CABG) X 3 USING LEFT INTERNAL MAMMARY ARTERY AND RIGHT SAPHENOUS VEIN- ENDOSCOPICALLY HARVESTED;  Surgeon: Melrose Nakayama, MD;  Location: [facility];  Service: Open Heart Surgery;  Laterality: N/A;  . LEFT HEART CATH AND CORONARY ANGIOGRAPHY N/A 07/13/2017   Procedure: LEFT HEART CATH AND CORONARY ANGIOGRAPHY;  Surgeon: Martinique, Peter M, MD;  Location: [facility];  Service: Cardiovascular;  Laterality: N/A;  . LEFT HEART CATH AND CORS/GRAFTS ANGIOGRAPHY N/A 11/03/2017   Procedure: LEFT HEART CATH AND CORS/GRAFTS  ANGIOGRAPHY;  Surgeon: Troy Sine, MD;  Location: [facility];  Service: Cardiovascular;  Laterality: N/A;  . TEE WITHOUT CARDIOVERSION N/A 07/15/2017   Procedure: TRANSESOPHAGEAL ECHOCARDIOGRAM (TEE);  Surgeon: Melrose Nakayama, MD;  Location: [facility];  Service: Open Heart Surgery;  Laterality: N/A;  . TUBAL LIGATION    . TYMPANOPLASTY Left    fluid from ear drum    Social History   Socioeconomic History  . Marital status: Married    Spouse name:  Not on file  . Number of children: Not on file  . Years of education: Not on file  . Highest education level: Not on file  Occupational History  . Occupation: Retired  Scientific laboratory technician  . Financial resource strain: Not on file  . Food insecurity:    Worry: Not on file    Inability: Not on file  . Transportation needs:    Medical: No    Non-medical: No  Tobacco Use  . Smoking status: Former Research scientist (life sciences)  . Smokeless tobacco: Former Systems developer    Types: Snuff  Substance and Sexual Activity  . Alcohol use: Yes    Alcohol/week: 1.0 standard drinks    Types: 1 Standard drinks or equivalent per week  . Drug use: No  . Sexual activity: Yes    Birth control/protection: Surgical  Lifestyle  . Physical activity:    Days per week: Not on file    Minutes per session: Not on file  . Stress: Not on file  Relationships  . Social connections:    Talks on phone: Not on file    Gets together: Not on file    Attends religious service: Not on file    Active member of club or organization: Not on file    Attends meetings of clubs or organizations: Not on file    Relationship status: Not on file  Other Topics Concern  . Not on file  Social History Narrative  . Not on file     FAMILY HISTORY:  We obtained a detailed, 4-generation family history.  Significant diagnoses are listed below: Family History  Problem Relation Age of Onset  . Diabetes Sister        AODM  . Heart disease Sister 59       CABG  . Breast cancer Sister   . Heart disease Brother 78       In his 44s  . Colon cancer Maternal Uncle 64  . Diabetes Sister        AODM  . Hypertension Daughter   . Hypertension Daughter   . Breast cancer Cousin        PATERNAL COUSIN  . Heart attack Father        In his 41s  . Stroke Mother   . Stroke Maternal Grandmother   . Diabetes Maternal Grandfather        d. 109  . Heart attack Paternal Grandmother   . Colon cancer Maternal Uncle 36  . Breast cancer Cousin        dx in her 36s; mat  first cousin  . Breast cancer Cousin        dx 70s; d. 91s  . Breast cancer Niece 15    The patient has two daughters who are cancer free.  She ha nine sisters and three brothers.  One sister had breast cancer at 49 and another sister has a daughter with breast cancer at 80.  Both parents are deceased.  The patient's  mother had a stroke and died at 45.  She had nine siblings.  Two brothers had colon cancer.  One of those brothers and a daughter with breast cancer.  The maternal grandparents are deceased from strokes and diabetes.  The patients father had four sisters, two full brothers and a paternal half brother.  One sister had a daughter with breast cancer.  The paternal grandparents are deceased from non related cancers.  Ms. Raigoza is unaware of previous family history of genetic testing for hereditary cancer risks. Patient's maternal ancestors are of [place] and [place] American descent, and paternal ancestors are of [place] and [place] American descent. There is no reported Ashkenazi Jewish ancestry. There is no known consanguinity.  GENETIC COUNSELING ASSESSMENT: LILYROSE TANNEY is a 73 y.o. female with a personal history of breast cancer and a family history of breast and colon cancer which is somewhat suggestive of a hereditary cancer syndrome and predisposition to cancer. We, therefore, discussed and recommended the following at today's visit.   DISCUSSION: We discussed that about 5-10% of breast cancer is hereditary with most cases due to BRCA mutations. There are other genes associated with hereditary breast cancer syndromes as well as colon cancer.  To her knowledge, nobody in the family has had genetic testing, but she will ask around.  We reviewed the characteristics, features and inheritance patterns of hereditary cancer syndromes. We also discussed genetic testing, including the appropriate family members to test, the process of testing, insurance coverage and turn-around-time  for results. We discussed the implications of a negative, positive and/or variant of uncertain significant result. In order to get genetic test results in a timely manner so that Ms. Bialy can use these genetic test results for surgical decisions, we recommended Ms. Railey pursue genetic testing for the STAT panel. If this test is negative, we then recommend Ms. Arrighi pursue reflex genetic testing to the common hereditary cancer gene panel. The Hereditary Gene Panel offered by [facility] includes sequencing and/or deletion duplication testing of the following 47 genes: APC, ATM, AXIN2, BARD1, BMPR1A, BRCA1, BRCA2, BRIP1, CDH1, CDK4, CDKN2A (p14ARF), CDKN2A (p16INK4a), CHEK2, CTNNA1, DICER1, EPCAM (Deletion/duplication testing only), GREM1 (promoter region deletion/duplication testing only), KIT, MEN1, MLH1, MSH2, MSH3, MSH6, MUTYH, NBN, NF1, NHTL1, PALB2, PDGFRA, PMS2, POLD1, POLE, PTEN, RAD50, RAD51C, RAD51D, SDHB, SDHC, SDHD, SMAD4, SMARCA4. STK11, TP53, TSC1, TSC2, and VHL.  The following genes were evaluated for sequence changes only: SDHA and HOXB13 c.251G>A variant only.   Based on Ms. Collingswood personal and family history of cancer, she meets medical criteria for genetic testing. Despite that she meets criteria, she may still have an out of pocket cost. We discussed that if her out of pocket cost for testing is over $100, the laboratory will call and confirm whether she wants to proceed with testing.  If the out of pocket cost of testing is less than $100 she will be billed by the genetic testing laboratory.   PLAN: After considering the risks, benefits, and limitations, Ms. Persing  provided informed consent to pursue genetic testing and the blood sample was sent to [facility] for analysis of the STAT and common hereditary cancer panel. Results should be available within approximately 2-3 weeks' time, at which point they will be disclosed by telephone to Ms. Mengel, as will any additional  recommendations warranted by these results. Ms. Venezia will receive a summary of her genetic counseling visit and a copy of her results once available. This information will also be available in  [facility]. We encouraged Ms. Vasconez to remain in contact with cancer genetics annually so that we can continuously update the family history and inform her of any changes in cancer genetics and testing that may be of benefit for her family. Ms. Denno questions were answered to her satisfaction today. Our contact information was provided should additional questions or concerns arise.  Lastly, we encouraged Ms. Principato to remain in contact with cancer genetics annually so that we can continuously update the family history and inform her of any changes in cancer genetics and testing that may be of benefit for this family.   Ms.  Sexson questions were answered to her satisfaction today. Our contact information was provided should additional questions or concerns arise. Thank you for the referral and allowing [place] to share in the care of your patient.   Tipton Ballow P. Florene Glen, [place], [facility] Certified Genetic Counselor [email or web address] phone: [phone]  The patient was seen for a total of 48 minutes in face-to-face genetic counseling.  This patient was discussed with Drs. Magrinat, Lindi Adie and/or Burr Medico who agrees with the above.    _______________________________________________________________________ For Office Staff:  Number of people involved in session: 3 Was an Intern/ student involved with case: yes: [employer]

## 2018-08-23 NOTE — Progress Notes (Signed)
[place]  [phone] [phone]    ID: Gloria Mccoy DOB: March 08, 1946  MR#: [id number]  SWN#:[id number]  Patient Care Team: Redmond School, MD as PCP - General (Internal Medicine) Harl Bowie, Alphonse Guild, MD as PCP - Cardiology (Cardiology) , Virgie Dad, MD as Consulting Physician (Oncology) Kyung Rudd, MD as Consulting Physician (Radiation Oncology) Rutherford Guys, MD as Consulting Physician (Ophthalmology) Jovita Kussmaul, MD as Consulting Physician (General Surgery) OTHER MD:    CHIEF COMPLAINT: Triple negative breast cancer  CURRENT TREATMENT: Awaiting definitive surgery   HISTORY OF CURRENT ILLNESS: Gloria Mccoy had routine screening mammography on 07/12/2018 showing a possible abnormality in the left breast. She underwent unilateral left diagnostic mammography with tomography and left breast ultrasonography at [facility] on 07/25/2018 showing: Breast Density Category B. There is a mass with indistinct margins in the lower inner posterior left breast measuring approximately 0.6 cm. Targeted ultrasound of the lower left breast was performed. There is a hypoechoic mass with margin irregularity in the left breast at 7 o'clock, 7 cm from the nipple measuring 0.7 x 0.5 x 0.6 cm. No lymphadenopathy seen in the left axilla.   Accordingly on 08/01/2018 she proceeded to biopsy of the left breast area in question. The pathology from this procedure showed ([id number]): invasive ductal carcinoma, grade II. Prognostic indicators significant for: estrogen receptor, 0% negative and progesterone receptor, 0% negative. Proliferation marker Ki67 at 40%. HER2 negative (1+) by immunohistochemistry.   The patient's subsequent history is as detailed below.   INTERVAL HISTORY: Gloria Mccoy was evaluated in the multidisciplinary breast cancer clinic on 08/24/2018 accompanied by her daughter, Gloria Mccoy, and her sister, Gloria Mccoy.   Gloria Mccoy underwent a breast MRI on  08/24/2018. Results show breast composition B.  In the posterior third of the lower inner quadrant of the left breast there is an irregular enhancing mass measuring 1.3 cm.  There is no enhancement involving the pectoralis.  No abnormal abnormal appearing lymph nodes  REVIEW OF SYSTEMS: There were no specific symptoms leading to the original mammogram, which was routinely scheduled. The patient denies unusual headaches, visual changes, nausea, vomiting, stiff neck, dizziness, or gait imbalance. There has been no cough, phlegm production, or pleurisy, no chest pain or pressure, and no change in bowel or bladder habits. The patient denies fever, rash, bleeding, unexplained fatigue or unexplained weight loss. A detailed review of systems was otherwise entirely negative.   PAST MEDICAL HISTORY: Past Medical History:  Diagnosis Date  . Arthritis    "hands sometimes" (07/13/2017)  . Coronary artery disease    a. s/p CABG x3 in 06/2017 with LIMA-LAD, SVG-D1, and SVG-RI.  b. 10/2017: cath showing a widely patent LIMA-LAD with ostial occlusion of the SVG-RI and subtotally occluded atretic SVG-D1. Graft occlusion thought to be 2ry to improvement in pre-CABG stenoses.   . Dips tobacco   . Essential hypertension   . Family history of breast cancer   . Family history of colon cancer   . Hypothyroid   . Meniere disease   . Obesity   . Osteopenia 01/2012   T score -1.3 FRAX 7.9%/0.6%     PAST SURGICAL HISTORY: Past Surgical History:  Procedure Laterality Date  . CARDIAC CATHETERIZATION  07/13/2017  . CATARACT EXTRACTION W/PHACO Right 01/28/2015   Procedure: CATARACT EXTRACTION PHACO AND INTRAOCULAR LENS PLACEMENT :  CDE:  5.70;  Surgeon: Rutherford Guys, MD;  Location: [facility];  Service: Ophthalmology;  Laterality: Right;  . CATARACT EXTRACTION W/PHACO Left 02/11/2015  Procedure: CATARACT EXTRACTION PHACO AND INTRAOCULAR LENS PLACEMENT (IOC);  Surgeon: Rutherford Guys, MD;  Location: [facility];  Service:  Ophthalmology;  Laterality: Left;  CDE: 7.38  . COLONOSCOPY N/A 09/26/2012   Procedure: COLONOSCOPY;  Surgeon: Jamesetta So, MD;  [facility];  Service: Gastroenterology;  Laterality: N/A;  . CORONARY ARTERY BYPASS GRAFT N/A 07/15/2017   Procedure: CORONARY ARTERY BYPASS GRAFTING (CABG) X 3 USING LEFT INTERNAL MAMMARY ARTERY AND RIGHT SAPHENOUS VEIN- ENDOSCOPICALLY HARVESTED;  Surgeon: Melrose Nakayama, MD;  Location: [facility];  Service: Open Heart Surgery;  Laterality: N/A;  . LEFT HEART CATH AND CORONARY ANGIOGRAPHY N/A 07/13/2017   Procedure: LEFT HEART CATH AND CORONARY ANGIOGRAPHY;  Surgeon: Martinique, Peter M, MD;  Location: [facility];  Service: Cardiovascular;  Laterality: N/A;  . LEFT HEART CATH AND CORS/GRAFTS ANGIOGRAPHY N/A 11/03/2017   Procedure: LEFT HEART CATH AND CORS/GRAFTS ANGIOGRAPHY;  Surgeon: Troy Sine, MD;  Location: [facility];  Service: Cardiovascular;  Laterality: N/A;  . TEE WITHOUT CARDIOVERSION N/A 07/15/2017   Procedure: TRANSESOPHAGEAL ECHOCARDIOGRAM (TEE);  Surgeon: Melrose Nakayama, MD;  Location: [facility];  Service: Open Heart Surgery;  Laterality: N/A;  . TUBAL LIGATION    . TYMPANOPLASTY Left    fluid from ear drum     FAMILY HISTORY: Family History  Problem Relation Age of Onset  . Diabetes Sister        AODM  . Heart disease Sister 73       CABG  . Breast cancer Sister   . Heart disease Brother 61       In his 7s  . Colon cancer Maternal Uncle 38  . Diabetes Sister        AODM  . Hypertension Daughter   . Hypertension Daughter   . Breast cancer Cousin        PATERNAL COUSIN  . Heart attack Father        In his 59s  . Stroke Mother   . Stroke Maternal Grandmother   . Diabetes Maternal Grandfather        d. 109  . Heart attack Paternal Grandmother   . Colon cancer Maternal Uncle 60  . Breast cancer Cousin        dx in her 20s; mat first cousin  . Breast cancer Cousin        dx 42s; d. 61s  . Breast cancer  Niece 37   Gloria Mccoy's father died from a myocardial infarction at age 72. Patients' mother died from a stroke at age 81. The patient has 3 brothers and 9 sisters. One of Gloria Mccoy's sisters, Gloria Mccoy, was diagnosed with breast cancer at the age of 61. Azrielle also has a niece that was diagnosed with breast cancer at 26, and two cousins that were diagnosed with breast cancer.  She believes 1 of her brothers may have prostate cancer.  Patient denies anyone in her family having ovarian or pancreatic cancer. Skylar has an uncle that was diagnosed with colon cancer and an uncle that was diagnosed with "stomach" cancer.    GYNECOLOGIC HISTORY:  No LMP recorded. Patient is postmenopausal. 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 Her husband, Jason Fila, is a retired Administrator. They have two daughters. Their daughter, Gloria Mccoy, lives  in [place] and is a housewife.  Their other daughter works at [employer]. Zaley had one grandchild that is now deceased. She attends a [employer].    ADVANCED DIRECTIVES: Ashtin's husband, Jason Fila, is automatically her healthcare power of attorney.     HEALTH MAINTENANCE: Social History   Tobacco Use  . Smoking status: Former Research scientist (life sciences)  . Smokeless tobacco: Former Systems developer    Types: Snuff  Substance Use Topics  . Alcohol use: Yes    Alcohol/week: 1.0 standard drinks    Types: 1 Standard drinks or equivalent per week  . Drug use: No    Colonoscopy: yes, Dr. Arnoldo Morale  PAP:   Bone density: yes, 2016, -1.1 osteopenic   Allergies  Allergen Reactions  . Sulfa Antibiotics Shortness Of Breath  . Sulfasalazine Shortness Of Breath    Current Outpatient Medications  Medication Sig Dispense Refill  . acetaminophen (TYLENOL) 325 MG tablet Take 2 tablets (650 mg total) by mouth every 6 (six)  hours as needed for mild pain.    Marland Kitchen aspirin EC 81 MG tablet Take 81 mg by mouth daily.    Marland Kitchen atorvastatin (LIPITOR) 80 MG tablet Take 1 tablet (80 mg total) by mouth daily at 6 PM. 90 tablet 3  . Cholecalciferol (VITAMIN D PO) Take 1 tablet by mouth daily.     . clopidogrel (PLAVIX) 75 MG tablet Take 1 tablet (75 mg total) by mouth daily. 90 tablet 3  . Cyanocobalamin (VITAMIN B-12 PO) Take 1 tablet by mouth daily.    . fish oil-omega-3 fatty acids 1000 MG capsule Take 1 g by mouth daily.    . furosemide (LASIX) 40 MG tablet Take 1 tablet (40 mg total) by mouth daily. 30 tablet 1  . levothyroxine (SYNTHROID, LEVOTHROID) 75 MCG tablet Take 75 mcg by mouth daily.    Marland Kitchen losartan (COZAAR) 25 MG tablet Take 0.5 tablets (12.5 mg total) by mouth daily. 45 tablet 3  . metoprolol tartrate (LOPRESSOR) 25 MG tablet Take 1 tablet (25 mg total) by mouth 2 (two) times daily. 180 tablet 3  . VITAMIN E PO Take 1 tablet by mouth daily.      No current facility-administered medications for this visit.      OBJECTIVE: Middle-aged African-American woman in no acute distress  Vitals:   08/24/18 1527  BP: (!) 165/66  Pulse: 94  Resp: 18  Temp: 97.7 F (36.5 C)  SpO2: 94%     Body mass index is 36.97 kg/m.   Wt Readings from Last 3 Encounters:  08/24/18 208 lb 11.2 oz (94.7 kg)  08/22/18 208 lb (94.3 kg)  08/16/18 209 lb 9.6 oz (95.1 kg)      ECOG FS:1 - Symptomatic but completely ambulatory  Ocular: Sclerae unicteric, EOMs intact Lymphatic: No cervical or supraclavicular adenopathy Lungs no rales or rhonchi Heart regular rate and rhythm Abd soft, nontender, positive bowel sounds MSK no focal spinal tenderness, no joint edema Neuro: non-focal, well-oriented, appropriate affect Breasts: The right breast is unremarkable.  I do not palpate a well demarcated mass in the left breast.  There are no skin or nipple changes of concern.  Both axillae are benign.   LAB RESULTS:  CMP     Component  Value Date/Time   NA 140 11/02/2017 0934   K 4.5 11/02/2017 0934   CL 102 11/02/2017 0934   CO2 29 11/02/2017 0934   GLUCOSE 110 (H) 11/02/2017 0934   BUN 11 11/02/2017 0934   CREATININE 0.85  11/02/2017 0934   CALCIUM 8.9 11/02/2017 0934   PROT 7.6 11/18/2017 0935   ALBUMIN 3.6 11/18/2017 0935   AST 18 11/18/2017 0935   ALT 14 11/18/2017 0935   ALKPHOS 65 11/18/2017 0935   BILITOT 0.7 11/18/2017 0935   GFRNONAA >60 11/02/2017 0934   GFRAA >60 11/02/2017 0934    No results found for: TOTALPROTELP, ALBUMINELP, A1GS, A2GS, BETS, BETA2SER, GAMS, MSPIKE, SPEI  No results found for: KPAFRELGTCHN, LAMBDASER, [facility]  Lab Results  Component Value Date   WBC 9.3 11/02/2017   NEUTROABS 4.6 11/02/2017   HGB 13.1 11/02/2017   HCT 38.2 11/02/2017   MCV 80.1 11/02/2017   PLT 199 11/02/2017    '@LASTCHEMISTRY' @  No results found for: LABCA2  No components found for: [id number]  No results for input(s): INR in the last 168 hours.  No results found for: LABCA2  No results found for: BOF751  No results found for: WCH852  No results found for: DPO242  No results found for: CA2729  No components found for: HGQUANT  No results found for: CEA1 / No results found for: CEA1   No results found for: AFPTUMOR  No results found for: CHROMOGRNA  No results found for: PSA1  No visits with results within 3 Day(s) from this visit.  Latest known visit with results is:  Hospital Outpatient Visit on 11/18/2017  Component Date Value Ref Range Status  . Total Protein 11/18/2017 7.6  6.5 - 8.1 g/dL Final  . Albumin 11/18/2017 3.6  3.5 - 5.0 g/dL Final  . AST 11/18/2017 18  15 - 41 U/L Final  . ALT 11/18/2017 14  14 - 54 U/L Final  . Alkaline Phosphatase 11/18/2017 65  38 - 126 U/L Final  . Total Bilirubin 11/18/2017 0.7  0.3 - 1.2 mg/dL Final  . Bilirubin, Direct 11/18/2017 0.1  0.1 - 0.5 mg/dL Final  . Indirect Bilirubin 11/18/2017 0.6  0.3 - 0.9 mg/dL Final   Performed at [facility], [address] [place], [place] [address]  . Cholesterol 11/18/2017 79  0 - 200 mg/dL Final  . Triglycerides 11/18/2017 52  <150 mg/dL Final  . HDL 11/18/2017 25* >40 mg/dL Final  . Total CHOL/HDL Ratio 11/18/2017 3.2  RATIO Final  . VLDL 11/18/2017 10  0 - 40 mg/dL Final  . LDL Cholesterol 11/18/2017 44  0 - 99 mg/dL Final   Comment:        Total Cholesterol/HDL:CHD Risk Coronary Heart Disease Risk Table                     Men   Women  1/2 Average Risk   3.4   3.3  Average Risk       5.0   4.4  2 X Average Risk   9.6   7.1  3 X Average Risk  23.4   11.0        Use the calculated Patient Ratio above and the CHD Risk Table to determine the patient's CHD Risk.        ATP III CLASSIFICATION (LDL):  <100     mg/dL   Optimal  100-129  mg/dL   Near or Above                    Optimal  130-159  mg/dL   Borderline  160-189  mg/dL   High  >190     mg/dL   Very High Performed at [facility],  [address], [place], [place] [address]     (this displays the last labs from the last 3 days)  No results found for: TOTALPROTELP, ALBUMINELP, A1GS, A2GS, BETS, BETA2SER, GAMS, MSPIKE, SPEI (this displays SPEP labs)  No results found for: KPAFRELGTCHN, LAMBDASER, KAPLAMBRATIO (kappa/lambda light chains)  No results found for: HGBA, HGBA2QUANT, HGBFQUANT, HGBSQUAN (Hemoglobinopathy evaluation)   No results found for: LDH  No results found for: IRON, TIBC, IRONPCTSAT (Iron and TIBC)  No results found for: FERRITIN  Urinalysis    Component Value Date/Time   COLORURINE YELLOW 07/14/2017 2015   APPEARANCEUR HAZY (A) 07/14/2017 2015   LABSPEC 1.011 07/14/2017 2015   PHURINE 6.0 07/14/2017 2015   GLUCOSEU NEGATIVE 07/14/2017 2015   [place] 07/14/2017 2015   BILIRUBINUR NEGATIVE 07/14/2017 2015   KETONESUR NEGATIVE 07/14/2017 2015   PROTEINUR NEGATIVE 07/14/2017 2015   UROBILINOGEN 0.2 07/03/2014 0852   NITRITE NEGATIVE 07/14/2017 2015   LEUKOCYTESUR LARGE (A)  07/14/2017 2015     STUDIES:  Mr Breast Bilateral W Wo Contrast Inc Cad  Result Date: 08/24/2018 CLINICAL DATA:  73 year old patient recently diagnosed with invasive ductal carcinoma in the lower inner quadrant of the left breast. LABS:  Creatinine was obtained on site at [facility] at [address] Results: Creatinine 0.8 mg/dL. EXAM: BILATERAL BREAST MRI WITH AND WITHOUT CONTRAST TECHNIQUE: Multiplanar, multisequence MR images of both breasts were obtained prior to and following the intravenous administration of 10 ml of Gadavist Three-dimensional MR images were rendered by post-processing of the original MR data on an independent workstation. The three-dimensional MR images were interpreted, and findings are reported in the following complete MRI report for this study. Three dimensional images were evaluated at the independent DynaCad workstation COMPARISON:  Screening mammogram July 12, 2018 and prior screening mammograms dating back to 2010, diagnostic left mammogram left breast ultrasound left breast biopsy February 2020 and post clip mammogram February 2020. FINDINGS: Breast composition: b. Scattered fibroglandular tissue. Background parenchymal enhancement: Mild Right breast: No suspicious mass or abnormal enhancement. Benign intramammary lymph nodes in the outer right breast. Left breast: In the posterior third of the lower inner quadrant of the left breast is an irregular enhancing mass spanning approximately 1.3 x 0.8 x 0.5 cm with internal biopsy clip artifact and washout kinetics, corresponding to the biopsy-proven malignancy. The mass/enhancement do not involve the pectoralis muscle. Benign intramammary lymph node in the middle third of the outer left breast is mammographically stable. There is more glandular tissue in the left breast on the right, corresponding with chronic appearance of mammograms dating back to at least 2010. Lymph nodes: No abnormal appearing lymph nodes.  Ancillary findings:  Median sternotomy changes noted. IMPRESSION: Solitary biopsy-proven malignancy in the posterior third of the lower inner quadrant of the left breast. RECOMMENDATION: Continue treatment plan for known left breast malignancy. BI-RADS CATEGORY  6: Known biopsy-proven malignancy. Electronically Signed   By: Curlene Dolphin M.D.   On: 08/24/2018 15:51   Mm Clip Placement Left  Result Date: 08/01/2018 CLINICAL DATA:  Post ultrasound-guided biopsy of an indeterminate mass in the left breast at 7 o'clock 7 cm from nipple. EXAM: DIAGNOSTIC LEFT MAMMOGRAM POST ULTRASOUND BIOPSY COMPARISON:  Previous exam(s). FINDINGS: Mammographic images were obtained following ultrasound guided biopsy of a mass in the left breast at the 7 o'clock position. A ribbon shaped biopsy marking clip is positioned at the site of the biopsied mass in the left breast at the 7 o'clock position. IMPRESSION: Ribbon shaped  biopsy marking clip at site of biopsied mass in the left breast at the 7 position. Final Assessment: Post Procedure Mammograms for Marker Placement Electronically Signed   By: Everlean Alstrom M.D.   On: 08/01/2018 08:58   [place] Lt Breast Bx W Loc Dev 1st Lesion Img Bx Spec US Guide  Addendum Date: 08/02/2018   ADDENDUM REPORT: 08/02/2018 16:45 ADDENDUM: Pathology of the left breast biopsy revealed INVASIVE DUCTAL CARCINOMA, GRADE II. This was found to be concordant by Dr. Shelly Bombard. Recommendation: Surgical referral. At the patient's request, results and recommendations were relayed to the patient by phone by Jetta Lout, [place] on 08/02/2018. The patient stated she did well following the biopsy with no bleeding, bruising, or pain. Post biopsy instructions were reviewed with the patient and all of her questions were answered. Request for referral was relayed to Kathi Der, RT at [facility]. She will contact the patient and surgical referral will be placed. The patient was encouraged to contact Jetta Lout, [place] or the imaging department of [facility] with any further questions or concerns. Addendum by Jetta Lout, RRA on 08/02/2018 Electronically Signed   By: Everlean Alstrom M.D.   On: 08/02/2018 16:45   Result Date: 08/02/2018 CLINICAL DATA:  73 year old female with an indeterminate mass in the left breast at 7 o'clock 7 cm from nipple. EXAM: ULTRASOUND GUIDED LEFT BREAST CORE NEEDLE BIOPSY COMPARISON:  Previous exam(s). FINDINGS: I met with the patient and we discussed the procedure of ultrasound-guided biopsy, including benefits and alternatives. We discussed the high likelihood of a successful procedure. We discussed the risks of the procedure, including infection, bleeding, tissue injury, clip migration, and inadequate sampling. Informed written consent was given. The usual time-out protocol was performed immediately prior to the procedure. Lesion quadrant: Lower inner Using sterile technique and 1% Lidocaine as local anesthetic, under direct ultrasound visualization, a 14 gauge spring-loaded device was used to perform biopsy of the mass in the left breast at the 7 o'clock position using a medial to lateral approach. At the conclusion of the procedure a ribbon shaped tissue marker clip was deployed into the biopsy cavity. Follow up 2 view mammogram was performed and dictated separately. IMPRESSION: Ultrasound guided biopsy of the mass in the left breast at the 7 o'clock position. No apparent complications. Electronically Signed: By: Everlean Alstrom M.D. On: 08/01/2018 09:00     ELIGIBLE FOR AVAILABLE RESEARCH PROTOCOL: no   ASSESSMENT: 73 y.o. [place], [place] woman status post left breast lower inner quadrant biopsy 08/01/2018 for a clinical T1c N0, stage IB invasive ductal carcinoma, grade 2, triple negative, with an MIB-1 of 40%  (1) genetics testing pending  (2) definitive surgery pending  (3) consider adjuvant chemotherapy  (4) adjuvant radiation to follow   PLAN: I spent  approximately 60 minutes face to face with Earlie Server with more than 50% of that time spent in counseling and coordination of care. Specifically we reviewed the biology of the patient's diagnosis and the specifics of her situation. We first reviewed the fact that cancer is not one disease but more than 100 different diseases and that it is important to keep them separate-- otherwise when friends and relatives discuss their own cancer experiences with [place] confusion can result. Similarly we explained that if breast cancer spreads to the bone or liver, the patient would not have bone cancer or liver cancer, but breast cancer in the bone and breast cancer in the liver: one cancer in three places-- not 3  different cancers which otherwise would have to be treated in 3 different ways.  We discussed the difference between local and systemic therapy. In terms of loco-regional treatment, lumpectomy plus radiation is equivalent to mastectomy as far as survival is concerned. For this reason, and because the cosmetic results are generally superior, we recommend breast conserving surgery followed by radiation  We then discussed the rationale for systemic therapy. There is some risk that this cancer may have already spread to other parts of her body. Patients frequently ask at this point about bone scans, CAT scans and PET scans to find out if they have occult breast cancer somewhere else. The problem is that in early stage disease we are much more likely to find false positives then true cancers and this would expose the patient to unnecessary procedures as well as unnecessary radiation. Scans cannot answer the question the patient really would like to know, which is whether she has microscopic disease elsewhere in her body. For those reasons we do not recommend them.  Of course we would proceed to aggressive evaluation of any symptoms that might suggest metastatic disease, but that is not the case here.  Next we went  over the options for systemic therapy which are anti-estrogens, anti-HER-2 immunotherapy, and chemotherapy. Heavin does not meet criteria for anti-HER-2 immunotherapy or anti-estrogens.  The only systemic therapy available to his chemotherapy  Most patients in this situation would be offered chemotherapy.  Given Khalila's significant comorbidities, and her chronic angina, we have to balance the possible benefits of systemic treatment with competing causes of death.  I would like to have a better idea of her chance of living 2 or 5 years from her cardiologist.    Accordingly at this point while we have not made a definitive decision against chemotherapy, the plan is to wait and review the final pathology and at that time make a definitive decision.    Floris does qualify for genetics testing and this will be arranged. In patients who carry a deleterious mutation [for example in a  BRCA gene], the risk of a new breast cancer developing in the future may be sufficiently great that a patient may choose bilateral mastectomies. However if Merrilee wishes to keep her breasts in that situation it is safe to do so.  We would recommend intensified screening, which generally means not only yearly mammography but a yearly breast MRI as well.  Accordingly there is no need to wait on the definitive genetics results before proceeding to surgery  In short the plan is for surgery, then a consideration of chemotherapy, followed by radiation, and genetics testing  Dynasty has a good understanding of this plan.  She agrees with it.  She knows the goal of treatment in her case is cure.  She will call with any issues that may develop before the next visit.   , Virgie Dad, MD  08/24/18 6:08 PM Medical Oncology and Hematology [facility] [address] [place], [place] [address] Tel. [phone]    Fax. [phone]   I, Jacqualyn Posey am acting as a Education administrator for Chauncey Cruel, MD.   I, Lurline Del MD, have reviewed the above documentation for accuracy and completeness, and I agree with the above.

## 2018-08-24 ENCOUNTER — Ambulatory Visit
Admission: RE | Admit: 2018-08-24 | Discharge: 2018-08-24 | Disposition: A | Discharge status: Home / Self Care | Payer: Medicare HMO | Source: Ambulatory Visit | Attending: General Surgery | Admitting: General Surgery

## 2018-08-24 ENCOUNTER — Inpatient Hospital Stay: Payer: Medicare HMO

## 2018-08-24 ENCOUNTER — Inpatient Hospital Stay (HOSPITAL_BASED_OUTPATIENT_CLINIC_OR_DEPARTMENT_OTHER): Payer: Medicare HMO | Admitting: Oncology

## 2018-08-24 VITALS — BP 165/66 | HR 94 | Temp 97.7°F | Resp 18 | Ht 63.0 in | Wt 208.7 lb

## 2018-08-24 DIAGNOSIS — Z803 Family history of malignant neoplasm of breast: Secondary | ICD-10-CM | POA: Diagnosis not present

## 2018-08-24 DIAGNOSIS — Z8249 Family history of ischemic heart disease and other diseases of the circulatory system: Secondary | ICD-10-CM

## 2018-08-24 DIAGNOSIS — Z171 Estrogen receptor negative status [ER-]: Secondary | ICD-10-CM

## 2018-08-24 DIAGNOSIS — Z800 Family history of malignant neoplasm of digestive organs: Secondary | ICD-10-CM

## 2018-08-24 DIAGNOSIS — I10 Essential (primary) hypertension: Secondary | ICD-10-CM

## 2018-08-24 DIAGNOSIS — I2510 Atherosclerotic heart disease of native coronary artery without angina pectoris: Secondary | ICD-10-CM

## 2018-08-24 DIAGNOSIS — Z79899 Other long term (current) drug therapy: Secondary | ICD-10-CM

## 2018-08-24 DIAGNOSIS — I214 Non-ST elevation (NSTEMI) myocardial infarction: Secondary | ICD-10-CM

## 2018-08-24 DIAGNOSIS — Z7982 Long term (current) use of aspirin: Secondary | ICD-10-CM | POA: Diagnosis not present

## 2018-08-24 DIAGNOSIS — Z87891 Personal history of nicotine dependence: Secondary | ICD-10-CM | POA: Diagnosis not present

## 2018-08-24 DIAGNOSIS — C50312 Malignant neoplasm of lower-inner quadrant of left female breast: Secondary | ICD-10-CM

## 2018-08-24 DIAGNOSIS — C50512 Malignant neoplasm of lower-outer quadrant of left female breast: Secondary | ICD-10-CM

## 2018-08-24 DIAGNOSIS — Z951 Presence of aortocoronary bypass graft: Secondary | ICD-10-CM

## 2018-08-24 DIAGNOSIS — M8580 Other specified disorders of bone density and structure, unspecified site: Secondary | ICD-10-CM | POA: Diagnosis not present

## 2018-08-24 DIAGNOSIS — Z170 Estrogen receptor positive status [ER+]: Principal | ICD-10-CM

## 2018-08-24 MED ORDER — GADOBUTROL 1 MMOL/ML IV SOLN
10.0000 mL | Freq: Once | INTRAVENOUS | Status: AC | PRN
  Administered 2018-08-24: 10 mL via INTRAVENOUS

## 2018-08-28 ENCOUNTER — Other Ambulatory Visit: Payer: Self-pay | Admitting: General Surgery

## 2018-08-28 ENCOUNTER — Ambulatory Visit: Payer: Self-pay | Admitting: General Surgery

## 2018-08-28 DIAGNOSIS — C50512 Malignant neoplasm of lower-outer quadrant of left female breast: Secondary | ICD-10-CM

## 2018-08-28 DIAGNOSIS — Z171 Estrogen receptor negative status [ER-]: Principal | ICD-10-CM

## 2018-08-28 NOTE — Pre-Procedure Instructions (Addendum)
Gloria Mccoy  08/28/2018      [place], [place] - [address] [place] [address] Phone: [phone] Fax: [phone]    Your procedure is scheduled on March 13  Report to [facility] at 0730 A.M.  Call this number if you have problems the morning of surgery:  [phone]   Remember:  Do not eat after midnight. You can have clear liquids until 0630 am the morning of surgery.  Clear Liquids allowed: Black coffee only, Gatorade, carbonated beverages, and water    Take these medicines the morning of surgery with A SIP OF WATER  acetaminophen (TYLENOL)  If needed levothyroxine (SYNTHROID, LEVOTHROID) metoprolol tartrate (LOPRESSOR)  Stop Plavix 5 days prior to surgery  7 days prior to surgery STOP taking any Aspirin (unless otherwise instructed by your surgeon), Aleve, Naproxen, Ibuprofen, Motrin, Advil, Goody's, BC's, all herbal medications, fish oil, and all vitamins.  Follow your surgeon's instructions on when to stop Asprin.  If no instructions were given by your surgeon then you will need to call the office to get those instructions.       Do not wear jewelry, make-up or nail polish.  Do not wear lotions, powders, or perfumes, or deodorant.  Do not shave 48 hours prior to surgery.    Do not bring valuables to the hospital.  [facility] is not responsible for any belongings or valuables.  Contacts, dentures or bridgework may not be worn into surgery.  Leave your suitcase in the car.  After surgery it may be brought to your room.  For patients admitted to the hospital, discharge time will be determined by your treatment team.  Patients discharged the day of surgery will not be allowed to drive home.    Special instructions:   [facility]- Preparing For Surgery  Before surgery, you can play an important role. Because skin is not sterile, your skin needs to be as free of germs as possible. You  can reduce the number of germs on your skin by washing with CHG (chlorahexidine gluconate) Soap before surgery.  CHG is an antiseptic cleaner which kills germs and bonds with the skin to continue killing germs even after washing.    Oral Hygiene is also important to reduce your risk of infection.  Remember - BRUSH YOUR TEETH THE MORNING OF SURGERY WITH YOUR REGULAR TOOTHPASTE  Please do not use if you have an allergy to CHG or antibacterial soaps. If your skin becomes reddened/irritated stop using the CHG.  Do not shave (including legs and underarms) for at least 48 hours prior to first CHG shower. It is OK to shave your face.  Please follow these instructions carefully.   1. Shower the NIGHT BEFORE SURGERY and the MORNING OF SURGERY with CHG.   2. If you chose to wash your hair, wash your hair first as usual with your normal shampoo.  3. After you shampoo, rinse your hair and body thoroughly to remove the shampoo.  4. Use CHG as you would any other liquid soap. You can apply CHG directly to the skin and wash gently with a scrungie or a clean washcloth.   5. Apply the CHG Soap to your body ONLY FROM THE NECK DOWN.  Do not use on open wounds or open sores. Avoid contact with your eyes, ears, mouth and genitals (private parts). Wash Face and genitals (private parts)  with your normal soap.  6.  Wash thoroughly, paying special attention to the area where your surgery will be performed.  7. Thoroughly rinse your body with warm water from the neck down.  8. DO NOT shower/wash with your normal soap after using and rinsing off the CHG Soap.  9. Pat yourself dry with a CLEAN TOWEL.  10. Wear CLEAN PAJAMAS to bed the night before surgery, wear comfortable clothes the morning of surgery  11. Place CLEAN SHEETS on your bed the night of your first shower and DO NOT SLEEP WITH PETS.    Day of Surgery:  Do not apply any deodorants/lotions.  Please wear clean clothes to the hospital/surgery  center.   Remember to brush your teeth WITH YOUR REGULAR TOOTHPASTE.    Please read over the following fact sheets that you were given.

## 2018-08-29 ENCOUNTER — Other Ambulatory Visit: Payer: Self-pay

## 2018-08-29 ENCOUNTER — Encounter (HOSPITAL_COMMUNITY): Payer: Self-pay

## 2018-08-29 ENCOUNTER — Encounter: Payer: Self-pay | Admitting: Genetic Counselor

## 2018-08-29 ENCOUNTER — Telehealth: Payer: Self-pay | Admitting: Genetic Counselor

## 2018-08-29 ENCOUNTER — Encounter (HOSPITAL_COMMUNITY)
Admission: RE | Admit: 2018-08-29 | Discharge: 2018-08-29 | Disposition: A | Discharge status: Home / Self Care | Payer: Medicare HMO | Source: Ambulatory Visit | Attending: General Surgery | Admitting: General Surgery

## 2018-08-29 DIAGNOSIS — R05 Cough: Secondary | ICD-10-CM | POA: Diagnosis not present

## 2018-08-29 DIAGNOSIS — I2510 Atherosclerotic heart disease of native coronary artery without angina pectoris: Secondary | ICD-10-CM | POA: Diagnosis not present

## 2018-08-29 DIAGNOSIS — E6609 Other obesity due to excess calories: Secondary | ICD-10-CM | POA: Diagnosis not present

## 2018-08-29 DIAGNOSIS — Z6836 Body mass index (BMI) 36.0-36.9, adult: Secondary | ICD-10-CM | POA: Diagnosis not present

## 2018-08-29 DIAGNOSIS — E039 Hypothyroidism, unspecified: Secondary | ICD-10-CM | POA: Diagnosis not present

## 2018-08-29 DIAGNOSIS — I10 Essential (primary) hypertension: Secondary | ICD-10-CM | POA: Diagnosis not present

## 2018-08-29 DIAGNOSIS — C50512 Malignant neoplasm of lower-outer quadrant of left female breast: Secondary | ICD-10-CM | POA: Diagnosis not present

## 2018-08-29 DIAGNOSIS — Z1379 Encounter for other screening for genetic and chromosomal anomalies: Secondary | ICD-10-CM | POA: Insufficient documentation

## 2018-08-29 DIAGNOSIS — F419 Anxiety disorder, unspecified: Secondary | ICD-10-CM | POA: Diagnosis not present

## 2018-08-29 DIAGNOSIS — Z803 Family history of malignant neoplasm of breast: Secondary | ICD-10-CM | POA: Diagnosis not present

## 2018-08-29 DIAGNOSIS — Z1389 Encounter for screening for other disorder: Secondary | ICD-10-CM | POA: Diagnosis not present

## 2018-08-29 DIAGNOSIS — Z171 Estrogen receptor negative status [ER-]: Secondary | ICD-10-CM | POA: Diagnosis not present

## 2018-08-29 DIAGNOSIS — E063 Autoimmune thyroiditis: Secondary | ICD-10-CM | POA: Diagnosis not present

## 2018-08-29 DIAGNOSIS — Z01812 Encounter for preprocedural laboratory examination: Secondary | ICD-10-CM | POA: Insufficient documentation

## 2018-08-29 DIAGNOSIS — Z79899 Other long term (current) drug therapy: Secondary | ICD-10-CM | POA: Diagnosis not present

## 2018-08-29 DIAGNOSIS — Z8249 Family history of ischemic heart disease and other diseases of the circulatory system: Secondary | ICD-10-CM | POA: Diagnosis not present

## 2018-08-29 DIAGNOSIS — Z800 Family history of malignant neoplasm of digestive organs: Secondary | ICD-10-CM | POA: Diagnosis not present

## 2018-08-29 DIAGNOSIS — J45909 Unspecified asthma, uncomplicated: Secondary | ICD-10-CM | POA: Diagnosis not present

## 2018-08-29 DIAGNOSIS — Z79890 Hormone replacement therapy (postmenopausal): Secondary | ICD-10-CM | POA: Diagnosis not present

## 2018-08-29 DIAGNOSIS — I252 Old myocardial infarction: Secondary | ICD-10-CM | POA: Diagnosis not present

## 2018-08-29 DIAGNOSIS — Z87891 Personal history of nicotine dependence: Secondary | ICD-10-CM | POA: Diagnosis not present

## 2018-08-29 DIAGNOSIS — Z951 Presence of aortocoronary bypass graft: Secondary | ICD-10-CM | POA: Diagnosis not present

## 2018-08-29 DIAGNOSIS — I200 Unstable angina: Secondary | ICD-10-CM | POA: Diagnosis not present

## 2018-08-29 HISTORY — DX: Family history of other specified conditions: Z84.89

## 2018-08-29 HISTORY — DX: Acute myocardial infarction, unspecified: I21.9

## 2018-08-29 LAB — BASIC METABOLIC PANEL
Anion gap: 9 (ref 5–15)
BUN: 12 mg/dL (ref 8–23)
CO2: 23 mmol/L (ref 22–32)
Calcium: 9.1 mg/dL (ref 8.9–10.3)
Chloride: 107 mmol/L (ref 98–111)
Creatinine, Ser: 0.93 mg/dL (ref 0.44–1.00)
GFR calc Af Amer: >60 mL/min (ref >60)
GFR calc non Af Amer: >60 mL/min (ref >60)
GLUCOSE: 112 mg/dL — AB (ref 70–99)
Potassium: 3.8 mmol/L (ref 3.5–5.1)
Sodium: 139 mmol/L (ref 135–145)

## 2018-08-29 LAB — CBC
HCT: 38.9 % (ref 36.0–46.0)
HEMOGLOBIN: 13.7 g/dL (ref 12.0–15.0)
MCH: 29.7 pg (ref 26.0–34.0)
MCHC: 35.2 g/dL (ref 30.0–36.0)
MCV: 84.4 fL (ref 80.0–100.0)
Platelets: 271 10*3/uL (ref 150–400)
RBC: 4.61 MIL/uL (ref 3.87–5.11)
RDW: 13.9 % (ref 11.5–15.5)
WBC: 10.6 10*3/uL — ABNORMAL HIGH (ref 4.0–10.5)
nRBC: 0 % (ref 0.0–0.2)

## 2018-08-29 NOTE — Telephone Encounter (Signed)
LM on VM that results are back and to please call. 

## 2018-08-29 NOTE — Progress Notes (Addendum)
PCP - Redmond School Cardiologist - Branch  Chest x-ray - 11/02/17 EKG - 11/02/17 Stress Test - denies ECHO - 07/15/17 Cardiac Cath - 11/03/17  Blood Thinner Instructions: hold 5 days Plavix Aspirin Instructions: continue aspirn - left message for patient about insurctions  Anesthesia review: Yes, Hx Cad Patient stated that her sister had anesthesia complications her last surgery.  She was admitted after surgery for observation overnight and discharged the next day.   After she got home the sister began to have a foggy appearance, and would not respond to family. 911 called and the sister was readmitted for 6 days.  Family was told it was because of the sisters preexisting lung issues.  Sister is on oxygen at home.  Dr. Roanna Banning contacted about this history and he stated that it didn't seem significant due to the sisters history.  The Patient has never had any complications from anesthesia.    Also Patient received Clearance from Cardiology and Dr Harl Bowie advised the patient to contact her PCP because the patient has had an ongoing cough for the last year since her Stent placement.  Patient has not made that appointment.  Patient called PCP office today and will be seen today. Today's office visit has been requested making note that it was a future appointment that is being requested about.  Patient denies shortness of breath, fever, and chest pain at PAT appointment   Patient verbalized understanding of instructions that were given to them at the PAT appointment. Patient was also instructed that they will need to review over the PAT instructions again at home before surgery.

## 2018-08-30 NOTE — Anesthesia Preprocedure Evaluation (Addendum)
Anesthesia Evaluation  Patient identified by MRN, date of birth, ID band Patient awake    Reviewed: Allergy & Precautions, NPO status , Patient's Chart, lab work & pertinent test results  Airway Mallampati: II       Dental no notable dental hx. (+) Teeth Intact   Pulmonary former smoker,    Pulmonary exam normal breath sounds clear to auscultation       Cardiovascular hypertension, Pt. on medications and Pt. on home beta blockers + CAD and + Past MI  Normal cardiovascular exam Rhythm:Regular Rate:Normal     Neuro/Psych negative psych ROS   GI/Hepatic negative GI ROS, Neg liver ROS,   Endo/Other  Hypothyroidism   Renal/GU negative Renal ROS  negative genitourinary   Musculoskeletal   Abdominal (+) + obese,   Peds  Hematology   Anesthesia Other Findings   Reproductive/Obstetrics                           Anesthesia Physical Anesthesia Plan  ASA: III  Anesthesia Plan: General   Post-op Pain Management:  Regional for Post-op pain   Induction: Intravenous  PONV Risk Score and Plan: 3 and Ondansetron and Dexamethasone  Airway Management Planned: LMA  Additional Equipment:   Intra-op Plan:   Post-operative Plan:   Informed Consent: I have reviewed the patients History and Physical, chart, labs and discussed the procedure including the risks, benefits and alternatives for the proposed anesthesia with the patient or authorized representative who has indicated his/her understanding and acceptance.     Dental advisory given  Plan Discussed with: CRNA  Anesthesia Plan Comments: (Follows with cardiology for management of CAD. - admit 06/2017 with unstable angina. - 06/2017 with distal LM 80%, prox LAD 90%. She was referred for CABG/ s/p LIMA-LAD, SVG-D1, SVG-RI - 06/2017 echo LVEF 55-60% - 10/2017 admitted with chest pain. Admitted with NSTEMI - 10/2017 cath with ostial LAD 45%, mid LAD  80%, D1 40%, ramus 20%, LCX 25%, RCA patent. LIMA-LAD patent, SVG-ramus occluded, SVG-D1 99%. Recs for medical therapy.  Seen by Dr. Harl Bowie 08/22/18 and cleared for surgery "Ok to proceed with lumpectomy. Chronic stable SOB/DOE since her MI last year, no progression or new symptoms. Hold plavix 5 days prior to surgery, resume day after." Seen by PCP Dr. Gerarda Fraction 3/11 for mild chronic cough. Per notes her symptoms are stable, she has episodic wheezing concordant with exogenous irritants. She was given steroid and bronchodilator. )      Anesthesia Quick Evaluation

## 2018-08-31 ENCOUNTER — Ambulatory Visit
Admission: RE | Admit: 2018-08-31 | Discharge: 2018-08-31 | Disposition: A | Discharge status: Home / Self Care | Payer: Medicare HMO | Source: Ambulatory Visit | Attending: General Surgery | Admitting: General Surgery

## 2018-08-31 ENCOUNTER — Encounter: Payer: Self-pay | Admitting: *Deleted

## 2018-08-31 ENCOUNTER — Other Ambulatory Visit: Payer: Self-pay | Admitting: General Surgery

## 2018-08-31 ENCOUNTER — Other Ambulatory Visit: Payer: Self-pay

## 2018-08-31 DIAGNOSIS — C50512 Malignant neoplasm of lower-outer quadrant of left female breast: Secondary | ICD-10-CM

## 2018-08-31 DIAGNOSIS — Z171 Estrogen receptor negative status [ER-]: Principal | ICD-10-CM

## 2018-08-31 DIAGNOSIS — C50312 Malignant neoplasm of lower-inner quadrant of left female breast: Secondary | ICD-10-CM | POA: Diagnosis not present

## 2018-09-01 ENCOUNTER — Ambulatory Visit (HOSPITAL_COMMUNITY)
Admission: RE | Admit: 2018-09-01 | Discharge: 2018-09-01 | Disposition: A | Discharge status: Home / Self Care | Payer: Medicare HMO | Attending: General Surgery | Admitting: General Surgery

## 2018-09-01 ENCOUNTER — Ambulatory Visit (HOSPITAL_COMMUNITY): Payer: Medicare HMO | Admitting: Anesthesiology

## 2018-09-01 ENCOUNTER — Ambulatory Visit
Admission: RE | Admit: 2018-09-01 | Discharge: 2018-09-01 | Disposition: A | Discharge status: Home / Self Care | Payer: Medicare HMO | Source: Ambulatory Visit | Attending: General Surgery | Admitting: General Surgery

## 2018-09-01 ENCOUNTER — Ambulatory Visit (HOSPITAL_COMMUNITY): Payer: Medicare HMO | Admitting: Physician Assistant

## 2018-09-01 ENCOUNTER — Encounter (HOSPITAL_COMMUNITY)
Admission: RE | Admit: 2018-09-01 | Discharge: 2018-09-01 | Disposition: A | Discharge status: Home / Self Care | Payer: Medicare HMO | Source: Ambulatory Visit | Attending: General Surgery | Admitting: General Surgery

## 2018-09-01 ENCOUNTER — Encounter (HOSPITAL_COMMUNITY): Payer: Self-pay

## 2018-09-01 ENCOUNTER — Encounter (HOSPITAL_COMMUNITY)
Admission: RE | Disposition: A | Discharge status: Home / Self Care | Payer: Self-pay | Source: Home / Self Care | Attending: General Surgery

## 2018-09-01 DIAGNOSIS — C50512 Malignant neoplasm of lower-outer quadrant of left female breast: Secondary | ICD-10-CM | POA: Diagnosis not present

## 2018-09-01 DIAGNOSIS — Z79890 Hormone replacement therapy (postmenopausal): Secondary | ICD-10-CM | POA: Insufficient documentation

## 2018-09-01 DIAGNOSIS — I2510 Atherosclerotic heart disease of native coronary artery without angina pectoris: Secondary | ICD-10-CM | POA: Diagnosis not present

## 2018-09-01 DIAGNOSIS — C50912 Malignant neoplasm of unspecified site of left female breast: Secondary | ICD-10-CM | POA: Diagnosis not present

## 2018-09-01 DIAGNOSIS — Z171 Estrogen receptor negative status [ER-]: Secondary | ICD-10-CM | POA: Diagnosis not present

## 2018-09-01 DIAGNOSIS — G8918 Other acute postprocedural pain: Secondary | ICD-10-CM | POA: Diagnosis not present

## 2018-09-01 DIAGNOSIS — F419 Anxiety disorder, unspecified: Secondary | ICD-10-CM | POA: Diagnosis not present

## 2018-09-01 DIAGNOSIS — Z951 Presence of aortocoronary bypass graft: Secondary | ICD-10-CM | POA: Insufficient documentation

## 2018-09-01 DIAGNOSIS — I252 Old myocardial infarction: Secondary | ICD-10-CM | POA: Insufficient documentation

## 2018-09-01 DIAGNOSIS — I10 Essential (primary) hypertension: Secondary | ICD-10-CM | POA: Diagnosis not present

## 2018-09-01 DIAGNOSIS — E039 Hypothyroidism, unspecified: Secondary | ICD-10-CM | POA: Insufficient documentation

## 2018-09-01 DIAGNOSIS — Z87891 Personal history of nicotine dependence: Secondary | ICD-10-CM | POA: Diagnosis not present

## 2018-09-01 DIAGNOSIS — C50312 Malignant neoplasm of lower-inner quadrant of left female breast: Secondary | ICD-10-CM | POA: Diagnosis not present

## 2018-09-01 DIAGNOSIS — Z803 Family history of malignant neoplasm of breast: Secondary | ICD-10-CM | POA: Insufficient documentation

## 2018-09-01 DIAGNOSIS — Z800 Family history of malignant neoplasm of digestive organs: Secondary | ICD-10-CM | POA: Insufficient documentation

## 2018-09-01 DIAGNOSIS — Z79899 Other long term (current) drug therapy: Secondary | ICD-10-CM | POA: Insufficient documentation

## 2018-09-01 DIAGNOSIS — Z8249 Family history of ischemic heart disease and other diseases of the circulatory system: Secondary | ICD-10-CM | POA: Insufficient documentation

## 2018-09-01 HISTORY — PX: BREAST LUMPECTOMY WITH RADIOACTIVE SEED AND SENTINEL LYMPH NODE BIOPSY: SHX6550

## 2018-09-01 SURGERY — BREAST LUMPECTOMY WITH RADIOACTIVE SEED AND SENTINEL LYMPH NODE BIOPSY
Anesthesia: Regional | Site: Breast | Laterality: Left

## 2018-09-01 MED ORDER — SUCCINYLCHOLINE CHLORIDE 20 MG/ML IJ SOLN
INTRAMUSCULAR | Status: DC | PRN
  Administered 2018-09-01: 120 mg via INTRAVENOUS

## 2018-09-01 MED ORDER — FENTANYL CITRATE (PF) 100 MCG/2ML IJ SOLN
INTRAMUSCULAR | Status: DC | PRN
  Administered 2018-09-01: 100 ug via INTRAVENOUS

## 2018-09-01 MED ORDER — GABAPENTIN 300 MG PO CAPS
300.0000 mg | ORAL_CAPSULE | ORAL | Status: AC
  Administered 2018-09-01: 300 mg via ORAL

## 2018-09-01 MED ORDER — CHLORHEXIDINE GLUCONATE CLOTH 2 % EX PADS: 6.0000 | MEDICATED_PAD | Freq: Once | CUTANEOUS | Status: DC

## 2018-09-01 MED ORDER — SODIUM CHLORIDE 0.9 % IV SOLN
INTRAVENOUS | Status: DC | PRN
  Administered 2018-09-01: 30 ug/min via INTRAVENOUS

## 2018-09-01 MED ORDER — LIDOCAINE 2% (20 MG/ML) 5 ML SYRINGE
INTRAMUSCULAR | Status: AC
  Filled 2018-09-01: qty 5

## 2018-09-01 MED ORDER — GABAPENTIN 300 MG PO CAPS
ORAL_CAPSULE | ORAL | Status: AC
  Administered 2018-09-01: 300 mg via ORAL
  Filled 2018-09-01: qty 1

## 2018-09-01 MED ORDER — PHENYLEPHRINE 40 MCG/ML (10ML) SYRINGE FOR IV PUSH (FOR BLOOD PRESSURE SUPPORT)
PREFILLED_SYRINGE | INTRAVENOUS | Status: AC
  Filled 2018-09-01: qty 10

## 2018-09-01 MED ORDER — ACETAMINOPHEN 500 MG PO TABS
1000.0000 mg | ORAL_TABLET | ORAL | Status: AC
  Administered 2018-09-01: 1000 mg via ORAL

## 2018-09-01 MED ORDER — SUCCINYLCHOLINE CHLORIDE 200 MG/10ML IV SOSY
PREFILLED_SYRINGE | INTRAVENOUS | Status: AC
  Filled 2018-09-01: qty 10

## 2018-09-01 MED ORDER — CEFAZOLIN SODIUM-DEXTROSE 2-4 GM/100ML-% IV SOLN
INTRAVENOUS | Status: AC
  Filled 2018-09-01: qty 100

## 2018-09-01 MED ORDER — HYDROCODONE-ACETAMINOPHEN 5-325 MG PO TABS: 1.0000 | ORAL_TABLET | Freq: Four times a day (QID) | ORAL | 0 refills | Status: DC | PRN

## 2018-09-01 MED ORDER — DEXAMETHASONE SODIUM PHOSPHATE 4 MG/ML IJ SOLN
INTRAMUSCULAR | Status: DC | PRN
  Administered 2018-09-01: 5 mg via INTRAVENOUS

## 2018-09-01 MED ORDER — FENTANYL CITRATE (PF) 100 MCG/2ML IJ SOLN
INTRAMUSCULAR | Status: AC
  Administered 2018-09-01: 100 ug via INTRAVENOUS
  Filled 2018-09-01: qty 2

## 2018-09-01 MED ORDER — SODIUM CHLORIDE (PF) 0.9 % IJ SOLN
INTRAMUSCULAR | Status: AC
  Filled 2018-09-01: qty 10

## 2018-09-01 MED ORDER — ONDANSETRON HCL 4 MG/2ML IJ SOLN
INTRAMUSCULAR | Status: AC
  Filled 2018-09-01: qty 2

## 2018-09-01 MED ORDER — MEPERIDINE HCL 50 MG/ML IJ SOLN: 6.2500 mg | INTRAMUSCULAR | Status: DC | PRN

## 2018-09-01 MED ORDER — CEFAZOLIN SODIUM-DEXTROSE 2-4 GM/100ML-% IV SOLN
2.0000 g | INTRAVENOUS | Status: AC
  Administered 2018-09-01: 2 g via INTRAVENOUS

## 2018-09-01 MED ORDER — METHYLENE BLUE 0.5 % INJ SOLN
INTRAVENOUS | Status: AC
  Filled 2018-09-01: qty 10

## 2018-09-01 MED ORDER — LACTATED RINGERS IV SOLN
INTRAVENOUS | Status: DC
  Administered 2018-09-01: 08:00:00 via INTRAVENOUS

## 2018-09-01 MED ORDER — 0.9 % SODIUM CHLORIDE (POUR BTL) OPTIME
TOPICAL | Status: DC | PRN
  Administered 2018-09-01: 1000 mL

## 2018-09-01 MED ORDER — BUPIVACAINE-EPINEPHRINE (PF) 0.25% -1:200000 IJ SOLN
INTRAMUSCULAR | Status: AC
  Filled 2018-09-01: qty 30

## 2018-09-01 MED ORDER — DEXAMETHASONE SODIUM PHOSPHATE 10 MG/ML IJ SOLN
INTRAMUSCULAR | Status: AC
  Filled 2018-09-01: qty 1

## 2018-09-01 MED ORDER — EPHEDRINE SULFATE 50 MG/ML IJ SOLN
INTRAMUSCULAR | Status: DC | PRN
  Administered 2018-09-01: 10 mg via INTRAVENOUS

## 2018-09-01 MED ORDER — PHENYLEPHRINE HCL 10 MG/ML IJ SOLN
INTRAMUSCULAR | Status: DC | PRN
  Administered 2018-09-01 (×2): 40 ug via INTRAVENOUS

## 2018-09-01 MED ORDER — ACETAMINOPHEN 325 MG PO TABS: 325.0000 mg | ORAL_TABLET | ORAL | Status: DC | PRN

## 2018-09-01 MED ORDER — MIDAZOLAM HCL 2 MG/2ML IJ SOLN
INTRAMUSCULAR | Status: AC
  Filled 2018-09-01: qty 2

## 2018-09-01 MED ORDER — PROPOFOL 10 MG/ML IV BOLUS
INTRAVENOUS | Status: DC | PRN
  Administered 2018-09-01: 150 mg via INTRAVENOUS
  Administered 2018-09-01: 100 mg via INTRAVENOUS

## 2018-09-01 MED ORDER — FENTANYL CITRATE (PF) 250 MCG/5ML IJ SOLN
INTRAMUSCULAR | Status: AC
  Filled 2018-09-01: qty 5

## 2018-09-01 MED ORDER — OXYCODONE HCL 5 MG/5ML PO SOLN: 5.0000 mg | Freq: Once | ORAL | Status: DC | PRN

## 2018-09-01 MED ORDER — ONDANSETRON HCL 4 MG/2ML IJ SOLN: 4.0000 mg | Freq: Once | INTRAMUSCULAR | Status: DC | PRN

## 2018-09-01 MED ORDER — ACETAMINOPHEN 160 MG/5ML PO SOLN: 325.0000 mg | ORAL | Status: DC | PRN

## 2018-09-01 MED ORDER — ONDANSETRON HCL 4 MG/2ML IJ SOLN
INTRAMUSCULAR | Status: DC | PRN
  Administered 2018-09-01: 4 mg via INTRAVENOUS

## 2018-09-01 MED ORDER — LIDOCAINE 2% (20 MG/ML) 5 ML SYRINGE
INTRAMUSCULAR | Status: DC | PRN
  Administered 2018-09-01: 100 mg via INTRAVENOUS

## 2018-09-01 MED ORDER — ACETAMINOPHEN 500 MG PO TABS
ORAL_TABLET | ORAL | Status: AC
  Administered 2018-09-01: 1000 mg via ORAL
  Filled 2018-09-01: qty 2

## 2018-09-01 MED ORDER — FENTANYL CITRATE (PF) 100 MCG/2ML IJ SOLN: 25.0000 ug | INTRAMUSCULAR | Status: DC | PRN

## 2018-09-01 MED ORDER — EPHEDRINE 5 MG/ML INJ
INTRAVENOUS | Status: AC
  Filled 2018-09-01: qty 10

## 2018-09-01 MED ORDER — OXYCODONE HCL 5 MG PO TABS: 5.0000 mg | ORAL_TABLET | Freq: Once | ORAL | Status: DC | PRN

## 2018-09-01 MED ORDER — TECHNETIUM TC 99M SULFUR COLLOID FILTERED
1.0000 | Freq: Once | INTRAVENOUS | Status: AC | PRN
  Administered 2018-09-01: 1 via INTRADERMAL

## 2018-09-01 MED ORDER — FENTANYL CITRATE (PF) 100 MCG/2ML IJ SOLN
100.0000 ug | Freq: Once | INTRAMUSCULAR | Status: AC
  Administered 2018-09-01: 100 ug via INTRAVENOUS

## 2018-09-01 MED ORDER — BUPIVACAINE-EPINEPHRINE 0.25% -1:200000 IJ SOLN
INTRAMUSCULAR | Status: DC | PRN
  Administered 2018-09-01: 17 mL

## 2018-09-01 SURGICAL SUPPLY — 35 items
APPLIER CLIP 9.375 MED OPEN (MISCELLANEOUS) ×3
BINDER BREAST LRG (GAUZE/BANDAGES/DRESSINGS) IMPLANT
BINDER BREAST XLRG (GAUZE/BANDAGES/DRESSINGS) ×3 IMPLANT
CANISTER SUCT 3000ML PPV (MISCELLANEOUS) ×3 IMPLANT
CHLORAPREP W/TINT 26ML (MISCELLANEOUS) ×3 IMPLANT
CLIP APPLIE 9.375 MED OPEN (MISCELLANEOUS) ×1 IMPLANT
CONT SPEC 4OZ CLIKSEAL STRL BL (MISCELLANEOUS) ×3 IMPLANT
COVER PROBE W GEL 5X96 (DRAPES) ×3 IMPLANT
COVER SURGICAL LIGHT HANDLE (MISCELLANEOUS) ×3 IMPLANT
COVER WAND RF STERILE (DRAPES) IMPLANT
DERMABOND ADVANCED (GAUZE/BANDAGES/DRESSINGS) ×2
DERMABOND ADVANCED .7 DNX12 (GAUZE/BANDAGES/DRESSINGS) ×1 IMPLANT
DEVICE DUBIN SPECIMEN MAMMOGRA (MISCELLANEOUS) ×3 IMPLANT
DRAPE CHEST BREAST 15X10 FENES (DRAPES) ×3 IMPLANT
ELECT COATED BLADE 2.86 ST (ELECTRODE) ×3 IMPLANT
ELECT REM PT RETURN 9FT ADLT (ELECTROSURGICAL) ×3
ELECTRODE REM PT RTRN 9FT ADLT (ELECTROSURGICAL) ×1 IMPLANT
GLOVE BIO SURGEON STRL SZ7.5 (GLOVE) ×6 IMPLANT
GOWN STRL REUS W/ TWL LRG LVL3 (GOWN DISPOSABLE) ×2 IMPLANT
GOWN STRL REUS W/TWL LRG LVL3 (GOWN DISPOSABLE) ×4
KIT BASIN OR (CUSTOM PROCEDURE TRAY) ×3 IMPLANT
KIT MARKER MARGIN INK (KITS) ×3 IMPLANT
LIGHT WAVEGUIDE WIDE FLAT (MISCELLANEOUS) ×3 IMPLANT
NDL SAFETY ECLIPSE 18X1.5 (NEEDLE) IMPLANT
NEEDLE FILTER BLUNT 18X 1/2SAF (NEEDLE)
NEEDLE FILTER BLUNT 18X1 1/2 (NEEDLE) IMPLANT
NEEDLE HYPO 18GX1.5 SHARP (NEEDLE)
NEEDLE HYPO 25GX1X1/2 BEV (NEEDLE) ×3 IMPLANT
NS IRRIG 1000ML POUR BTL (IV SOLUTION) ×3 IMPLANT
PACK GENERAL/GYN (CUSTOM PROCEDURE TRAY) ×3 IMPLANT
SUT MNCRL AB 4-0 PS2 18 (SUTURE) ×6 IMPLANT
SUT VIC AB 3-0 SH 18 (SUTURE) ×3 IMPLANT
SYR CONTROL 10ML LL (SYRINGE) ×3 IMPLANT
TOWEL OR 17X24 6PK STRL BLUE (TOWEL DISPOSABLE) ×3 IMPLANT
TOWEL OR 17X26 10 PK STRL BLUE (TOWEL DISPOSABLE) ×3 IMPLANT

## 2018-09-01 NOTE — Transfer of Care (Signed)
Immediate Anesthesia Transfer of Care Note  Patient: Gloria Mccoy  Procedure(s) Performed: LEFT BREAST LUMPECTOMY WITH RADIOACTIVE SEED AND SENTINEL LYMPH NODE BIOPSY (Left Breast)  Patient Location: PACU  Anesthesia Type:General and Regional  Level of Consciousness: awake, alert , oriented and sedated  Airway & Oxygen Therapy: Patient Spontanous Breathing and Patient connected to nasal cannula oxygen  Post-op Assessment: Report given to RN, Post -op Vital signs reviewed and stable and Patient moving all extremities  Post vital signs: Reviewed and stable  Last Vitals:  Vitals Value Taken Time  BP 138/70 09/01/2018 11:44 AM  Temp    Pulse 59 09/01/2018 11:49 AM  Resp 13 09/01/2018 11:49 AM  SpO2 98 % 09/01/2018 11:49 AM  Vitals shown include unvalidated device data.  Last Pain:  Vitals:   09/01/18 0731  PainSc: 5          Complications: No apparent anesthesia complications

## 2018-09-01 NOTE — H&P (Signed)
Gloria Mccoy  Location: [facility] Patient #: [id number] DOB: 11/24/1945 Married / Language: English / Race: Black or African American Female   History of Present Illness The patient is a 73 year old female who presents with breast cancer. We are asked to see the patient in consultation by Dr. Redmond School to evaluate her for a new left breast cancer. The patient is a 73 year old white female who recently went for a routine screening mammogram. At that time she was found to have a 7 mm mass in the lower portion of the left breast. Her lymph nodes in her axilla looked normal. 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 She does have a history of breast cancer in a sister and a niece. She also had a maternal uncle with stomach cancer and a maternal uncle with colon cancer. She also reports that she had a myocardial infarction about one year ago and had a 3 vessel coronary artery bypass grafting surgery. She then had another myocardial infarction during rehabilitation. She seems to be doing well now. She does not smoke and quit dipping about one year ago   Past Surgical History  No pertinent past surgical history   Diagnostic Studies History Colonoscopy  1-5 years ago Mammogram  within last year  Allergies   No Known Drug Allergies    Medication History  Atorvastatin Calcium (10MG Tablet, Oral) Active. Cephalexin (500MG Capsule, Oral) Active. Furosemide (20MG Tablet, Oral) Active. Levothyroxine Sodium (75MCG Tablet, Oral) Active. Furosemide (40MG Tablet, Oral) Active. Lisinopril (2.5MG Tablet, Oral) Active. Metoprolol Tartrate (25MG Tablet, Oral) Active. Losartan Potassium (25MG Tablet, Oral) Active. Escitalopram Oxalate (10MG Tablet, Oral) Active. Medications Reconciled  Social History  Alcohol use  Occasional alcohol use. Caffeine use  Coffee. No drug use  Tobacco use   Former smoker.  Family History  Breast Cancer  Sister. Heart disease in female family member before age 9  Respiratory Condition  Daughter, Family Members In General. Thyroid problems  Mother.  Pregnancy / Birth History  Age at menarche  1 years. Age of menopause  50-55 Contraceptive History  Oral contraceptives. Gravida  2 Maternal age  69-20 Para  2 Regular periods   Other Problems  Anxiety Disorder  Back Pain  High blood pressure  Myocardial infarction  Thyroid Disease     Review of Systems  General Present- Fatigue. Not Present- Appetite Loss, Chills, Fever, Night Sweats, Weight Gain and Weight Loss. Skin Present- Dryness. Not Present- Change in Wart/Mole, Hives, Jaundice, New Lesions, Non-Healing Wounds, Rash and Ulcer. HEENT Present- Hearing Loss, Ringing in the Ears, Seasonal Allergies and Sinus Pain. Not Present- Earache, Hoarseness, Nose Bleed, Oral Ulcers, Sore Throat, Visual Disturbances, Wears glasses/contact lenses and Yellow Eyes. Respiratory Present- Wheezing. Not Present- Bloody sputum, Chronic Cough, Difficulty Breathing and Snoring. Cardiovascular Present- Chest Pain, Difficulty Breathing Lying Down, Shortness of Breath and Swelling of Extremities. Not Present- Leg Cramps, Palpitations and Rapid Heart Rate. Gastrointestinal Present- Constipation. Not Present- Abdominal Pain, Bloating, Bloody Stool, Change in Bowel Habits, Chronic diarrhea, Difficulty Swallowing, Excessive gas, Gets full quickly at meals, Hemorrhoids, Indigestion, Nausea, Rectal Pain and Vomiting. Female Genitourinary Present- Frequency and Urgency. Not Present- Nocturia, Painful Urination and Pelvic Pain. Musculoskeletal Present- Back Pain and Swelling of Extremities. Not Present- Joint Pain, Joint Stiffness, Muscle Pain and Muscle Weakness. Neurological Present- Decreased Memory and Headaches. Not Present- Fainting, Numbness, Seizures, Tingling, Tremor, Trouble walking and  Weakness.  Psychiatric Present- Anxiety and Change in Sleep Pattern. Not Present- Bipolar, Depression, Fearful and Frequent crying. Endocrine Present- Cold Intolerance and Excessive Hunger. Not Present- Hair Changes, Heat Intolerance, Hot flashes and New Diabetes. Hematology Present- Gland problems. Not Present- Blood Thinners, Easy Bruising, Excessive bleeding, HIV and Persistent Infections.  Vitals  Weight: 209.13 lb Height: 5.2in Body Surface Area: 0.32 m Body Mass Index: 5437.48 kg/m  Temp.: 97.32F(Temporal)  Pulse: 84 (Regular)  P.OX: 94% (Room air) BP: 132/82 (Sitting, Left Arm, Standard)       Physical Exam  General Mental Status-Alert. General Appearance-Consistent with stated age. Hydration-Well hydrated. Voice-Normal.  Head and Neck Head-normocephalic, atraumatic with no lesions or palpable masses. Trachea-midline. Thyroid Gland Characteristics - normal size and consistency.  Eye Eyeball - Bilateral-Extraocular movements intact. Sclera/Conjunctiva - Bilateral-No scleral icterus.  Chest and Lung Exam Chest and lung exam reveals -quiet, even and easy respiratory effort with no use of accessory muscles and on auscultation, normal breath sounds, no adventitious sounds and normal vocal resonance. Inspection Chest Wall - Normal. Back - normal.  Breast Note: There is no palpable mass in either breast. There is no palpable axillary, supraclavicular, or cervical lymphadenopathy.   Cardiovascular Cardiovascular examination reveals -normal heart sounds, regular rate and rhythm with no murmurs and normal pedal pulses bilaterally.  Abdomen Inspection Inspection of the abdomen reveals - No Hernias. Skin - Scar - no surgical scars. Palpation/Percussion Palpation and Percussion of the abdomen reveal - Soft, Non Tender, No Rebound tenderness, No Rigidity (guarding) and No hepatosplenomegaly. Auscultation Auscultation of the abdomen  reveals - Bowel sounds normal.  Neurologic Neurologic evaluation reveals -alert and oriented x 3 with no impairment of recent or remote memory. Mental Status-Normal.  Musculoskeletal Normal Exam - Left-Upper Extremity Strength Normal and Lower Extremity Strength Normal. Normal Exam - Right-Upper Extremity Strength Normal and Lower Extremity Strength Normal.  Lymphatic Head & Neck  General Head & Neck Lymphatics: Bilateral - Description - Normal. Axillary  General Axillary Region: Bilateral - Description - Normal. Tenderness - Non Tender. Femoral & Inguinal  Generalized Femoral & Inguinal Lymphatics: Bilateral - Description - Normal. Tenderness - Non Tender.    Assessment & Plan  MALIGNANT NEOPLASM OF LOWER-OUTER QUADRANT OF LEFT BREAST OF FEMALE, ESTROGEN RECEPTOR NEGATIVE (C50.512) Impression: The patient appears to have a small stage I cancer in the lower portion of the left breast. I have talked her about the different options for treatment and at this point she favors breast conservation. I think this is a reasonable way of treating her cancer. She is also a good candidate for sentinel node mapping. Given that her cancer is a triple negative she will need an MRI study of both breasts. With her recent history of myocardial infarction and coronary artery bypass grafting one year ago she will also need cardiac clearance by Dr. Roderic Palau branch. I will go ahead and make a referral to medical and radiation oncology also since she is triple negative. She may also be a candidate for genetic testing which will lead medical oncology decided on. If we get clearance then we will plan for a left breast radioactive seed localized lumpectomy and sentinel node mapping. Current Plans Referred to Oncology, for evaluation and follow up (Oncology). Routine. Pt Education - Breast Cancer: discussed with patient and provided information.

## 2018-09-01 NOTE — Telephone Encounter (Signed)
LM on VM that I was trying to reach her regarding her results.  Asked that she CB.

## 2018-09-01 NOTE — Interval H&P Note (Signed)
History and Physical Interval Note:  09/01/2018 8:53 AM  Gloria Mccoy  has presented today for surgery, with the diagnosis of LEFT BREAST CANCER.  The various methods of treatment have been discussed with the patient and family. After consideration of risks, benefits and other options for treatment, the patient has consented to  Procedure(s): LEFT BREAST LUMPECTOMY WITH RADIOACTIVE SEED AND SENTINEL LYMPH NODE BIOPSY (Left) as a surgical intervention.  The patient's history has been reviewed, patient examined, no change in status, stable for surgery.  I have reviewed the patient's chart and labs.  Questions were answered to the patient's satisfaction.     Autumn Messing III

## 2018-09-01 NOTE — Anesthesia Postprocedure Evaluation (Signed)
Anesthesia Post Note  Patient: Gloria Mccoy  Procedure(s) Performed: LEFT BREAST LUMPECTOMY WITH RADIOACTIVE SEED AND SENTINEL LYMPH NODE BIOPSY (Left Breast)     Patient location during evaluation: PACU Anesthesia Type: General Level of consciousness: awake and sedated Pain management: pain level controlled Vital Signs Assessment: post-procedure vital signs reviewed and stable Respiratory status: spontaneous breathing Cardiovascular status: stable Postop Assessment: no apparent nausea or vomiting Anesthetic complications: no    Last Vitals:  Vitals:   09/01/18 1217 09/01/18 1232  BP: (!) 158/68 (!) 157/80  Pulse: (!) 52 (!) 52  Resp: 11 11  Temp:    SpO2: 95% 90%    Last Pain:  Vitals:   09/01/18 1244  PainSc: 0-No pain   Pain Goal:                   Huston Foley

## 2018-09-01 NOTE — Anesthesia Procedure Notes (Signed)
Procedure Name: LMA Insertion Date/Time: 09/01/2018 9:59 AM Performed by: Scheryl Darter, CRNA Pre-anesthesia Checklist: Patient identified, Emergency Drugs available, Suction available and Patient being monitored Patient Re-evaluated:Patient Re-evaluated prior to induction Oxygen Delivery Method: Circle System Utilized Preoxygenation: Pre-oxygenation with 100% oxygen Induction Type: IV induction Ventilation: Mask ventilation without difficulty LMA: LMA inserted LMA Size: 4.0 Number of attempts: 1 Airway Equipment and Method: Bite block Placement Confirmation: positive ETCO2 Tube secured with: Tape Dental Injury: Teeth and Oropharynx as per pre-operative assessment

## 2018-09-01 NOTE — Anesthesia Procedure Notes (Signed)
Procedure Name: LMA Insertion Date/Time: 09/01/2018 10:32 AM Performed by: Scheryl Darter, CRNA Pre-anesthesia Checklist: Patient identified, Emergency Drugs available, Suction available and Patient being monitored Patient Re-evaluated:Patient Re-evaluated prior to induction Oxygen Delivery Method: Circle System Utilized Preoxygenation: Pre-oxygenation with 100% oxygen Induction Type: IV induction Ventilation: Mask ventilation without difficulty LMA: LMA inserted LMA Size: 4.0 Number of attempts: 1 Airway Equipment and Method: Bite block Placement Confirmation: positive ETCO2 Tube secured with: Tape Dental Injury: Teeth and Oropharynx as per pre-operative assessment  Comments: Previous LMA failure/attempted DL with glidescope by CRNA, could not visualize cords/DL x1 Dr Hatchett/cords not visualized/ #4 LMA replaced/good seal/good ventilation

## 2018-09-01 NOTE — Op Note (Signed)
09/01/2018  11:25 AM  PATIENT:  Gloria Mccoy  73 y.o. female  PRE-OPERATIVE DIAGNOSIS:  LEFT BREAST CANCER  POST-OPERATIVE DIAGNOSIS:  LEFT BREAST CANCER  PROCEDURE:  Procedure(s): LEFT BREAST LUMPECTOMY WITH RADIOACTIVE SEED LOCALIZATION AND DEEP LEFT AXILLARY SENTINEL LYMPH NODE BIOPSY (Left)  SURGEON:  Surgeon(s) and Role:    * Jovita Kussmaul, MD - Primary  PHYSICIAN ASSISTANT:   ASSISTANTS: none   ANESTHESIA:   local and general  EBL:  35 mL   BLOOD ADMINISTERED:none  DRAINS: none   LOCAL MEDICATIONS USED:  MARCAINE     SPECIMEN:  Source of Specimen:  LEFT BREAST TISSUE AND SENTINEL NODE X 3  DISPOSITION OF SPECIMEN:  PATHOLOGY  COUNTS:  YES  TOURNIQUET:  * No tourniquets in log *  DICTATION: .Dragon Dictation   After informed consent was obtained the patient was brought to the operating room and placed in the supine position on the operating table.  After adequate induction of general anesthesia the patient's left chest, breast, and axillary area were prepped with ChloraPrep, allowed to dry, and draped in usual sterile manner.  An appropriate timeout was performed.  Previously an I-125 seed was placed in the lower inner aspect of the left breast to mark an area of invasive breast cancer.  Earlier in the day the patient underwent injection of 1 mCi of technetium sulfur colloid in the subareolar position on the left.  The neoprobe was set to technetium and an area of radioactivity was readily identified in the left axilla.  This area was infiltrated with quarter percent Marcaine.  A transversely oriented small incision was made with a 15 blade knife.  The incision was carried through the skin and subcutaneous tissue sharply with the electrocautery until the deep left axillary space was entered.  Blunt dissection was carried out under the direction of the neoprobe until I was able to identify 2 hot lymph nodes and one palpable lymph node.  These were all excised sharply  with electrocautery and the lymphatics were controlled with clips.  Ex vivo counts on the first 2 nodes ranged from 100 to 1000.  The third node was palpable.  All 3 nodes were sent to pathology as sentinel nodes numbers 1 through 3.  No other hot or palpable lymph nodes were identified in the left axilla.  The deep layer of the wound was then closed with interrupted 3-0 Vicryl stitches.  The skin was closed with a running 4-0 Monocryl subcuticular stitch.  Dermabond dressings were applied.  Attention was then turned to the left breast.  The neoprobe was set to I-125 in the area of radioactivity was readily identified in the lower inner left breast.  This area was infiltrated with quarter percent Marcaine.  A inframammary fold incision was then made with a 15 blade knife.  The incision was carried through the skin and subcutaneous tissue sharply with electrocautery.  The dissection was then carried down to the chest wall and along the pectoralis muscle until we were well beyond the area of the cancer.  Next a circular portion of breast tissue was excised sharply around the radioactive seed while checking the area of radioactivity frequently.  Once the specimen was removed it was oriented with the appropriate paint colors.  A specimen radiograph was obtained that showed the clip and seed to be near the inferior edge of the specimen.  An additional inferior and anterior section of tissue was excised sharply from the lumpectomy cavity and marked  appropriately.  This was all sent to pathology for further evaluation.  Hemostasis was achieved using the Bovie electrocautery.  The cavity was marked with clips.  The wound was irrigated with saline.  The deep layer of the wound was then closed with interrupted 3-0 Vicryl stitches.  The skin was closed with a running 4-0 Monocryl subcuticular stitch.  Dermabond dressings were applied.  The patient tolerated the procedure well.  At the end of the case all needle sponge and  instrument counts were correct.  The patient was then awakened and taken recovery in stable condition.  PLAN OF CARE: Discharge to home after PACU  PATIENT DISPOSITION:  PACU - hemodynamically stable.   Delay start of Pharmacological VTE agent (>24hrs) due to surgical blood loss or risk of bleeding: not applicable

## 2018-09-01 NOTE — Anesthesia Procedure Notes (Addendum)
Anesthesia Regional Block: Pectoralis block   Pre-Anesthetic Checklist: ,, timeout performed, Correct Patient, Correct Site, Correct Laterality, Correct Procedure, Correct Position, site marked, Risks and benefits discussed,  Surgical consent,  Pre-op evaluation,  At surgeon's request and post-op pain management  Laterality: Left  Prep: chloraprep       Needles:  Injection technique: Single-shot  Needle Type: Echogenic Stimulator Needle     Needle Length: 10cm  Needle Gauge: 21   Needle insertion depth: 4.5 cm   Additional Needles:   Procedures:,,,, ultrasound used (permanent image in chart),,,,  Narrative:  Start time: 09/01/2018 8:50 AM End time: 09/01/2018 9:00 AM Injection made incrementally with aspirations every 5 mL.  Performed by: Personally  Anesthesiologist: Lyn Hollingshead, MD

## 2018-09-02 ENCOUNTER — Encounter (HOSPITAL_COMMUNITY): Payer: Self-pay | Admitting: General Surgery

## 2018-09-07 ENCOUNTER — Ambulatory Visit: Payer: Self-pay | Admitting: Genetic Counselor

## 2018-09-07 DIAGNOSIS — Z1379 Encounter for other screening for genetic and chromosomal anomalies: Secondary | ICD-10-CM

## 2018-09-07 NOTE — Progress Notes (Signed)
HPI:  Gloria Mccoy was previously seen in the [place] clinic due to a personal and family history of cancer and concerns regarding a hereditary predisposition to cancer. Please refer to our prior cancer genetics clinic note for more information regarding our discussion, assessment and recommendations, at the time. Gloria Mccoy recent genetic test results were disclosed to her, as were recommendations warranted by these results. These results and recommendations are discussed in more detail below.  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  The Hereditary Gene Panel offered by [facility] includes sequencing and/or deletion duplication testing of the following 47 genes: APC, ATM, AXIN2, BARD1, BMPR1A, BRCA1, BRCA2, BRIP1, CDH1, CDK4, CDKN2A (p14ARF), CDKN2A (p16INK4a), CHEK2, CTNNA1, DICER1, EPCAM (Deletion/duplication testing only), GREM1 (promoter region deletion/duplication testing only), KIT, MEN1, MLH1, MSH2, MSH3, MSH6, MUTYH, NBN, NF1, NHTL1, PALB2, PDGFRA, PMS2, POLD1, POLE, PTEN, RAD50, RAD51C, RAD51D, SDHB, SDHC, SDHD, SMAD4, SMARCA4. STK11, TP53, TSC1, TSC2, and VHL.  The following genes were evaluated for sequence changes only: SDHA and HOXB13 c.251G>A variant only. The report date is 08-29-2018.     FAMILY HISTORY:  We obtained a detailed, 4-generation family history.  Significant diagnoses are listed below: Family History  Problem Relation Age of Onset  . Diabetes Sister        AODM  . Heart disease Sister 34       CABG  . Breast cancer Sister   . Heart disease Brother 32       In his 34s  . Colon cancer Maternal Uncle 53  . Diabetes Sister        AODM  .  Hypertension Daughter   . Hypertension Daughter   . Breast cancer Cousin        PATERNAL COUSIN  . Heart attack Father        In his 52s  . Stroke Mother   . Stroke Maternal Grandmother   . Diabetes Maternal Grandfather        d. 109  . Heart attack Paternal Grandmother   . Colon cancer Maternal Uncle 54  . Breast cancer Cousin        dx in her 37s; mat first cousin  . Breast cancer Cousin        dx 64s; d. 16s  . Breast cancer Niece 24    The patient has two daughters who are cancer free.  She ha nine sisters and three brothers.  One sister had breast cancer at 16 and another sister has a daughter with breast cancer at 95.  Both parents are deceased.  The patient's mother had a stroke and died at 61.  She had nine siblings.  Two brothers had colon cancer.  One of those brothers and a daughter with breast cancer.  The maternal grandparents are deceased from strokes and diabetes.  The patients father had four sisters, two full brothers and a paternal half brother.  One sister had a daughter with breast cancer.  The paternal grandparents are deceased from non related cancers.  Gloria Mccoy is unaware of previous family history of genetic testing for hereditary cancer risks. Patient's maternal ancestors are of [place] and [place] American descent, and paternal ancestors  are of Cherokee and African American descent. There is no reported Ashkenazi Jewish ancestry. There is no known consanguinity.   GENETIC TEST RESULTS: Genetic testing reported out on August 29, 2018 through the Common hereditary cancer panel found no pathogenic mutations. The Hereditary Gene Panel offered by [facility] includes sequencing and/or deletion duplication testing of the following 48 genes: APC, ATM, AXIN2, BARD1, BMPR1A, BRCA1, BRCA2, BRIP1, CDH1, CDK4, CDKN2A (p14ARF), CDKN2A (p16INK4a), CHEK2, CTNNA1, DICER1, EPCAM (Deletion/duplication testing only), GREM1 (promoter region deletion/duplication testing only), KIT,  MEN1, MLH1, MSH2, MSH3, MSH6, MUTYH, NBN, NF1, NHTL1, PALB2, PDGFRA, PMS2, POLD1, POLE, PTEN, RAD50, RAD51C, RAD51D, RNF43, SDHB, SDHC, SDHD, SMAD4, SMARCA4. STK11, TP53, TSC1, TSC2, and VHL.  The following genes were evaluated for sequence changes only: SDHA and HOXB13 c.251G>A variant only. The test report has been scanned into EPIC and is located under the Molecular Pathology section of the Results Review tab.  A portion of the result report is included below for reference.     We discussed with Gloria Mccoy that because current genetic testing is not perfect, it is possible there may be a gene mutation in one of these genes that current testing cannot detect, but that chance is small.  We also discussed, that there could be another gene that has not yet been discovered, or that we have not yet tested, that is responsible for the cancer diagnoses in the family. It is also possible there is a hereditary cause for the cancer in the family that Gloria Mccoy did not inherit and therefore was not identified in her testing.  Therefore, it is important to remain in touch with cancer genetics in the future so that we can continue to offer Gloria Mccoy the most up to date genetic testing.   Genetic testing did identify a variant of uncertain significance (VUS) was identified in the MLH1 gene called c.1890T>G.  At this time, it is unknown if this variant is associated with increased cancer risk or if this is a normal finding, but most variants such as this get reclassified to being inconsequential. It should not be used to make medical management decisions. With time, we suspect the lab will determine the significance of this variant, if any. If we do learn more about it, we will try to contact Gloria Mccoy to discuss it further. However, it is important to stay in touch with [place] periodically and keep the address and phone number up to date.  ADDITIONAL GENETIC TESTING: We discussed with Gloria Mccoy that her genetic testing  was fairly extensive.  If there are genes identified to increase cancer risk that can be analyzed in the future, we would be happy to discuss and coordinate this testing at that time.    CANCER SCREENING RECOMMENDATIONS: Gloria Mccoy test result is considered negative (normal).  This means that we have not identified a hereditary cause for her personal and family history of cancer at this time. Most cancers happen by chance and this negative test suggests that her cancer may fall into this category.    While reassuring, this does not definitively rule out a hereditary predisposition to cancer. It is still possible that there could be genetic mutations that are undetectable by current technology. There could be genetic mutations in genes that have not been tested or identified to increase cancer risk.  Therefore, it is recommended she continue to follow the cancer management and screening guidelines provided by her oncology and primary healthcare provider.   An individual's cancer risk  and medical management are not determined by genetic test results alone. Overall cancer risk assessment incorporates additional factors, including personal medical history, family history, and any available genetic information that may result in a personalized plan for cancer prevention and surveillance  RECOMMENDATIONS FOR FAMILY MEMBERS:  Individuals in this family might be at some increased risk of developing cancer, over the general population risk, simply due to the family history of cancer.  We recommended women in this family have a yearly mammogram beginning at age 28, or 64 years younger than the earliest onset of cancer, an annual clinical breast exam, and perform monthly breast self-exams. Women in this family should also have a gynecological exam as recommended by their primary provider. All family members should have a colonoscopy by age 1.  FOLLOW-UP: Lastly, we discussed with Gloria Mccoy that cancer genetics is  a rapidly advancing field and it is possible that new genetic tests will be appropriate for her and/or her family members in the future. We encouraged her to remain in contact with cancer genetics on an annual basis so we can update her personal and family histories and let her know of advances in cancer genetics that may benefit this family.   Our contact number was provided. Gloria Mccoy questions were answered to her satisfaction, and she knows she is welcome to call us at anytime with additional questions or concerns.   Roma Kayser, MS, [facility] Certified Genetic Counselor [email or web address]

## 2018-09-07 NOTE — Telephone Encounter (Signed)
Revealed negative genetic testing.  Discussed that we do not know why she has breast cancer or why there is cancer in the family. It could be due to a different gene that we are not testing, or maybe our current technology may not be able to pick something up.  It will be important for her to keep in contact with genetics to keep up with whether additional testing may be needed.  Revealed there was one VUS.  This is still considered a normal test result.

## 2018-09-14 ENCOUNTER — Ambulatory Visit: Payer: Medicare HMO | Admitting: Cardiology

## 2018-09-25 ENCOUNTER — Telehealth: Payer: Self-pay | Admitting: Oncology

## 2018-09-25 NOTE — Telephone Encounter (Signed)
Tried to reach regarding change in schedule to webex visit

## 2018-09-26 ENCOUNTER — Telehealth: Payer: Self-pay | Admitting: Oncology

## 2018-09-26 NOTE — Telephone Encounter (Signed)
Left vm for patient regarding appt on 4/10. Told her to download the [employer] app and emailed her the join link. Told her to call back if she had any questions, as well as if she didn't have the technology for a virtual visit.

## 2018-09-29 ENCOUNTER — Inpatient Hospital Stay: Payer: Medicare HMO | Attending: Genetic Counselor | Admitting: Oncology

## 2018-09-29 ENCOUNTER — Other Ambulatory Visit: Payer: Medicare HMO

## 2018-09-29 DIAGNOSIS — Z171 Estrogen receptor negative status [ER-]: Secondary | ICD-10-CM | POA: Diagnosis not present

## 2018-09-29 DIAGNOSIS — Z1379 Encounter for other screening for genetic and chromosomal anomalies: Secondary | ICD-10-CM

## 2018-09-29 DIAGNOSIS — C50512 Malignant neoplasm of lower-outer quadrant of left female breast: Secondary | ICD-10-CM

## 2018-09-29 MED ORDER — PROCHLORPERAZINE MALEATE 10 MG PO TABS: 10.0000 mg | ORAL_TABLET | Freq: Four times a day (QID) | ORAL | 1 refills | Status: DC | PRN

## 2018-09-29 MED ORDER — DEXAMETHASONE 4 MG PO TABS: 8.0000 mg | ORAL_TABLET | Freq: Every day | ORAL | 1 refills | Status: DC

## 2018-09-29 NOTE — Progress Notes (Signed)
START OFF PATHWAY REGIMEN - Breast   [id number]:CMF (IV cyclophosphamide) q21 days:   A cycle is every 21 days:     Cyclophosphamide      Methotrexate      5-Fluorouracil   **Always confirm dose/schedule in your pharmacy ordering system**  Patient Characteristics: Postoperative without Neoadjuvant Therapy (Pathologic Staging), Invasive Disease, Adjuvant Therapy, HER2 Negative/Unknown/Equivocal, ER Negative/Unknown, Node Negative, pT1a-c, pN0/N40m or pT2 or Higher, pN0 Therapeutic Status: Postoperative without Neoadjuvant Therapy (Pathologic Staging) AJCC Grade: G2 AJCC N Category: pN0 AJCC M Category: cM0 ER Status: Negative (-) AJCC 8 Stage Grouping: IB HER2 Status: Negative (-) Oncotype Dx Recurrence Score: Not Appropriate AJCC T Category: pT1c PR Status: Negative (-) Intent of Therapy: Curative Intent, Discussed with Patient

## 2018-09-29 NOTE — Progress Notes (Signed)
[name]  [phone] [phone]    ID: Gloria Mccoy DOB: 10-07-1945  MR#: [id number]  SVX#:[id number]  Patient Care Team: Redmond School, MD as PCP - General (Internal Medicine) Harl Bowie, Alphonse Guild, MD as PCP - Cardiology (Cardiology) Arshia Rondon, Virgie Dad, MD as Consulting Physician (Oncology) Kyung Rudd, MD as Consulting Physician (Radiation Oncology) Rutherford Guys, MD as Consulting Physician (Ophthalmology) Jovita Kussmaul, MD as Consulting Physician (General Surgery) OTHER MD:    CHIEF COMPLAINT: Triple negative breast cancer  CURRENT TREATMENT: Adjuvant chemotherapy pending   HISTORY OF CURRENT ILLNESS: From the original intake note: Gloria Mccoy had routine screening mammography on 07/12/2018 showing a possible abnormality in the left breast. She underwent unilateral left diagnostic mammography with tomography and left breast ultrasonography at [facility] on 07/25/2018 showing: Breast Density Category B. There is a mass with indistinct margins in the lower inner posterior left breast measuring approximately 0.6 cm. Targeted ultrasound of the lower left breast was performed. There is a hypoechoic mass with margin irregularity in the left breast at 7 o'clock, 7 cm from the nipple measuring 0.7 x 0.5 x 0.6 cm. No lymphadenopathy seen in the left axilla.   Accordingly on 08/01/2018 she proceeded to biopsy of the left breast area in question. The pathology from this procedure showed ([id number]): invasive ductal carcinoma, grade II. Prognostic indicators significant for: estrogen receptor, 0% negative and progesterone receptor, 0% negative. Proliferation marker Ki67 at 40%. HER2 negative (1+) by immunohistochemistry.   The patient's subsequent history is as detailed below.   INTERVAL HISTORY: I connected with Gloria Mccoy on 09/29/18 at 11:00 AM EDT by telephone visit and verified that I am speaking with the correct person using two  identifiers.   I discussed the limitations, risks, security and privacy concerns of performing an evaluation and management service by telemedicine and the availability of in-person appointments. I also discussed with the patient that there may be a patient responsible charge related to this service. The patient expressed understanding and agreed to proceed.   Other persons participating in the visit and their role in the encounter: Patient's daughter  Patients location: Home Providers location: Clinic  Since the last visit here Gloria Mccoy proceeded to definitive left lumpectomy with sentinel lymph node biopsy on 09/01/2018.  This showed ([id number]) a 1.1 cm invasive ductal carcinoma, grade 2, with all 3 sentinel lymph nodes clear, and negative margins.  The patient has triple negative disease and generally chemotherapy is offered.  We have been concerned because of her cardiac history.  This is discussed further below.  REVIEW OF SYSTEMS: Gloria Mccoy did well with her surgery, with no unusual pain, bleeding, or fever.  She tells me she is doing "fine".  She is staying home because of the pandemic and going out only to shop for food.  She is not exercising currently.  She has had no fever, cough, shortness of breath, diarrhea, or any other unusual or worrisome symptoms.  A detailed review of systems today was otherwise stable.   PAST MEDICAL HISTORY: Past Medical History:  Diagnosis Date   Arthritis    "hands sometimes" (07/13/2017)   Coronary artery disease    a. s/p CABG x3 in 06/2017 with LIMA-LAD, SVG-D1, and SVG-RI.  b. 10/2017: cath showing a widely patent LIMA-LAD with ostial occlusion of the SVG-RI and subtotally occluded atretic SVG-D1. Graft occlusion thought to be 2ry to improvement in pre-CABG stenoses.    Essential hypertension    Family  history of adverse reaction to anesthesia    sister had a complication that was stated she had a "foggy" episode after her breast surgery was  readmitted 1 day post op after being discharged from the hospital. Sister also has significant lung problems that could have contributed to this    Family history of breast cancer    Family history of colon cancer    Hypothyroid    Meniere disease    Myocardial infarction ([place]) 06/2017   during cardaic rehab   Obesity    Osteopenia 01/2012   T score -1.3 FRAX 7.9%/0.6%     PAST SURGICAL HISTORY: Past Surgical History:  Procedure Laterality Date   BREAST LUMPECTOMY WITH RADIOACTIVE SEED AND SENTINEL LYMPH NODE BIOPSY Left 09/01/2018   Procedure: LEFT BREAST LUMPECTOMY WITH RADIOACTIVE SEED AND SENTINEL LYMPH NODE BIOPSY;  Surgeon: Jovita Kussmaul, MD;  Location: [facility];  Service: General;  Laterality: Left;   CARDIAC CATHETERIZATION  07/13/2017   CATARACT EXTRACTION W/PHACO Right 01/28/2015   Procedure: CATARACT EXTRACTION PHACO AND INTRAOCULAR LENS PLACEMENT :  CDE:  5.70;  Surgeon: Rutherford Guys, MD;  Location: [facility];  Service: Ophthalmology;  Laterality: Right;   CATARACT EXTRACTION W/PHACO Left 02/11/2015   Procedure: CATARACT EXTRACTION PHACO AND INTRAOCULAR LENS PLACEMENT (IOC);  Surgeon: Rutherford Guys, MD;  Location: [facility];  Service: Ophthalmology;  Laterality: Left;  CDE: 7.38   COLONOSCOPY N/A 09/26/2012   Procedure: COLONOSCOPY;  Surgeon: Jamesetta So, MD;  [facility];  Service: Gastroenterology;  Laterality: N/A;   CORONARY ARTERY BYPASS GRAFT N/A 07/15/2017   Procedure: CORONARY ARTERY BYPASS GRAFTING (CABG) X 3 USING LEFT INTERNAL MAMMARY ARTERY AND RIGHT SAPHENOUS VEIN- ENDOSCOPICALLY HARVESTED;  Surgeon: Melrose Nakayama, MD;  Location: [facility];  Service: Open Heart Surgery;  Laterality: N/A;   LEFT HEART CATH AND CORONARY ANGIOGRAPHY N/A 07/13/2017   Procedure: LEFT HEART CATH AND CORONARY ANGIOGRAPHY;  Surgeon: Martinique, Peter M, MD;  Location: [facility];  Service: Cardiovascular;  Laterality: N/A;   LEFT HEART CATH AND CORS/GRAFTS  ANGIOGRAPHY N/A 11/03/2017   Procedure: LEFT HEART CATH AND CORS/GRAFTS ANGIOGRAPHY;  Surgeon: Troy Sine, MD;  Location: [facility];  Service: Cardiovascular;  Laterality: N/A;   TEE WITHOUT CARDIOVERSION N/A 07/15/2017   Procedure: TRANSESOPHAGEAL ECHOCARDIOGRAM (TEE);  Surgeon: Melrose Nakayama, MD;  Location: [facility];  Service: Open Heart Surgery;  Laterality: N/A;   TUBAL LIGATION     TYMPANOPLASTY Left    fluid from ear drum     FAMILY HISTORY: Family History  Problem Relation Age of Onset   Diabetes Sister        AODM   Heart disease Sister 54       CABG   Breast cancer Sister    Heart disease Brother 40       In his 85s   Colon cancer Maternal Uncle 25   Diabetes Sister        AODM   Hypertension Daughter    Hypertension Daughter    Breast cancer Cousin        PATERNAL COUSIN   Heart attack Father        In his 4s   Stroke Mother    Stroke Maternal Grandmother    Diabetes Maternal Grandfather        d. 109   Heart attack Paternal Grandmother    Colon cancer Maternal Uncle 77   Breast cancer Cousin  dx in her 36s; mat first cousin   Breast cancer Cousin        dx 66s; d. 46s   Breast cancer Niece 26   Gloria Mccoy's father died from a myocardial infarction at age 85. Patients' mother died from a stroke at age 65. The patient has 3 brothers and 9 sisters. One of Gloria Mccoy's sisters, Gloria Mccoy, was diagnosed with breast cancer at the age of 30. Gloria Mccoy also has a niece that was diagnosed with breast cancer at 62, and two cousins that were diagnosed with breast cancer.  She believes 1 of her brothers may have prostate cancer.  Patient denies anyone in her family having ovarian or pancreatic cancer. Gloria Mccoy has an uncle that was diagnosed with colon cancer and an uncle that was diagnosed with "stomach" cancer.    GYNECOLOGIC HISTORY:  No LMP recorded. Patient is postmenopausal. 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 Her husband, Gloria Mccoy, is a retired Administrator. They have two daughters. Their daughter, Gloria Mccoy, lives in [place] and is a housewife.  Their other daughter works at [employer]. Gloria Mccoy had one grandchild that is now deceased. She attends a [employer].    ADVANCED DIRECTIVES: Anysha's husband, Gloria Mccoy, is automatically her healthcare power of attorney.     HEALTH MAINTENANCE: Social History   Tobacco Use   Smoking status: Former Smoker   Smokeless tobacco: Former Systems developer    Types: Snuff    Quit date: 07/31/2017  Substance Use Topics   Alcohol use: Yes    Alcohol/week: 1.0 standard drinks    Types: 1 Standard drinks or equivalent per week    Comment: occasionally   Drug use: No    Colonoscopy: yes, Dr. Arnoldo Morale  PAP:   Bone density: yes, 2016, -1.1 osteopenic   Allergies  Allergen Reactions   Sulfa Antibiotics Shortness Of Breath   Sulfasalazine Shortness Of Breath    Current Outpatient Medications  Medication Sig Dispense Refill   acetaminophen (TYLENOL) 325 MG tablet Take 2 tablets (650 mg total) by mouth every 6 (six) hours as needed for mild pain.     aspirin EC 81 MG tablet Take 81 mg by mouth daily.     atorvastatin (LIPITOR) 80 MG tablet Take 1 tablet (80 mg total) by mouth daily at 6 PM. 90 tablet 3   Cholecalciferol (VITAMIN D PO) Take 1 tablet by mouth daily.      clopidogrel (PLAVIX) 75 MG tablet Take 1 tablet (75 mg total) by mouth daily. 90 tablet 3   Cyanocobalamin (VITAMIN B-12 PO) Take 1 tablet by mouth daily.     fish oil-omega-3 fatty acids 1000 MG capsule Take 1 g by mouth daily.     furosemide (LASIX) 40 MG tablet Take 1 tablet (40 mg total) by mouth daily. 30 tablet 1   HYDROcodone-acetaminophen (NORCO/VICODIN) 5-325 MG tablet Take 1-2 tablets by mouth every 6 (six) hours  as needed for moderate pain or severe pain. 10 tablet 0   levothyroxine (SYNTHROID, LEVOTHROID) 75 MCG tablet Take 75 mcg by mouth daily.     losartan (COZAAR) 25 MG tablet Take 0.5 tablets (12.5 mg total) by mouth daily. 45 tablet 3   metoprolol tartrate (LOPRESSOR) 25 MG tablet Take 1 tablet (25 mg total) by mouth 2 (two) times daily.  180 tablet 3   VITAMIN E PO Take 1 tablet by mouth daily.      No current facility-administered medications for this visit.      OBJECTIVE: Middle-aged African-American woman   There were no vitals filed for this visit.   There is no height or weight on file to calculate BMI.   Wt Readings from Last 3 Encounters:  09/01/18 210 lb 8 oz (95.5 kg)  08/29/18 210 lb 8 oz (95.5 kg)  08/24/18 208 lb 11.2 oz (94.7 kg)      ECOG FS:1 - Symptomatic but completely ambulatory    LAB RESULTS:  CMP     Component Value Date/Time   NA 139 08/29/2018 0855   K 3.8 08/29/2018 0855   CL 107 08/29/2018 0855   CO2 23 08/29/2018 0855   GLUCOSE 112 (H) 08/29/2018 0855   BUN 12 08/29/2018 0855   CREATININE 0.93 08/29/2018 0855   CALCIUM 9.1 08/29/2018 0855   PROT 7.6 11/18/2017 0935   ALBUMIN 3.6 11/18/2017 0935   AST 18 11/18/2017 0935   ALT 14 11/18/2017 0935   ALKPHOS 65 11/18/2017 0935   BILITOT 0.7 11/18/2017 0935   GFRNONAA >60 08/29/2018 0855   GFRAA >60 08/29/2018 0855    No results found for: TOTALPROTELP, ALBUMINELP, A1GS, A2GS, BETS, BETA2SER, GAMS, MSPIKE, SPEI  No results found for: KPAFRELGTCHN, LAMBDASER, KAPLAMBRATIO  Lab Results  Component Value Date   WBC 10.6 (H) 08/29/2018   NEUTROABS 4.6 11/02/2017   HGB 13.7 08/29/2018   HCT 38.9 08/29/2018   MCV 84.4 08/29/2018   PLT 271 08/29/2018    '@LASTCHEMISTRY' @  No results found for: LABCA2  No components found for: BZJIRC789  No results for input(s): INR in the last 168 hours.  No results found for: LABCA2  No results found for: FYB017  No results found for: PZW258  No  results found for: NID782  No results found for: CA2729  No components found for: HGQUANT  No results found for: CEA1 / No results found for: CEA1   No results found for: AFPTUMOR  No results found for: CHROMOGRNA  No results found for: PSA1  No visits with results within 3 Day(s) from this visit.  Latest known visit with results is:  Hospital Outpatient Visit on 08/29/2018  Component Date Value Ref Range Status   WBC 08/29/2018 10.6* 4.0 - 10.5 K/uL Final   RBC 08/29/2018 4.61  3.87 - 5.11 MIL/uL Final   Hemoglobin 08/29/2018 13.7  12.0 - 15.0 g/dL Final   HCT 08/29/2018 38.9  36.0 - 46.0 % Final   MCV 08/29/2018 84.4  80.0 - 100.0 fL Final   MCH 08/29/2018 29.7  26.0 - 34.0 pg Final   MCHC 08/29/2018 35.2  30.0 - 36.0 g/dL Final   RDW 08/29/2018 13.9  11.5 - 15.5 % Final   Platelets 08/29/2018 271  150 - 400 K/uL Final   nRBC 08/29/2018 0.0  0.0 - 0.2 % Final   Performed at [facility], [place] [address] [place], [place] [address]   Sodium 08/29/2018 139  135 - 145 mmol/L Final   Potassium 08/29/2018 3.8  3.5 - 5.1 mmol/L Final   Chloride 08/29/2018 107  98 - 111 mmol/L Final   CO2 08/29/2018 23  22 - 32 mmol/L Final   Glucose, Bld 08/29/2018 112* 70 - 99 mg/dL Final   BUN 08/29/2018 12  8 - 23 mg/dL Final   Creatinine, Ser 08/29/2018 0.93  0.44 -  1.00 mg/dL Final   Calcium 08/29/2018 9.1  8.9 - 10.3 mg/dL Final   GFR calc non Af Amer 08/29/2018 >60  >60 mL/min Final   GFR calc Af Amer 08/29/2018 >60  >60 mL/min Final   Anion gap 08/29/2018 9  5 - 15 Final   Performed at [facility] [address], [place], [place] [address]    (this displays the last labs from the last 3 days)  No results found for: TOTALPROTELP, ALBUMINELP, A1GS, A2GS, BETS, BETA2SER, GAMS, MSPIKE, SPEI (this displays SPEP labs)  No results found for: KPAFRELGTCHN, LAMBDASER, KAPLAMBRATIO (kappa/lambda light chains)  No results found for: HGBA, HGBA2QUANT,  HGBFQUANT, HGBSQUAN (Hemoglobinopathy evaluation)   No results found for: LDH  No results found for: IRON, TIBC, IRONPCTSAT (Iron and TIBC)  No results found for: FERRITIN  Urinalysis    Component Value Date/Time   COLORURINE YELLOW 07/14/2017 2015   APPEARANCEUR HAZY (A) 07/14/2017 2015   LABSPEC 1.011 07/14/2017 2015   PHURINE 6.0 07/14/2017 2015   [place] 07/14/2017 2015   [place] 07/14/2017 2015   [place] 07/14/2017 2015   KETONESUR NEGATIVE 07/14/2017 2015   PROTEINUR NEGATIVE 07/14/2017 2015   UROBILINOGEN 0.2 07/03/2014 0852   NITRITE NEGATIVE 07/14/2017 2015   LEUKOCYTESUR LARGE (A) 07/14/2017 2015     STUDIES:  Nm Sentinel Node Inj-no Rpt (breast)  Result Date: 09/01/2018 Sulfur colloid was injected by the nuclear medicine technologist for melanoma sentinel node.   Mm Breast Surgical Specimen  Result Date: 09/01/2018 CLINICAL DATA:  LEFT breast lumpectomy for biopsy-proven invasive ductal carcinoma. Radioactive seed localization was performed yesterday. EXAM: SPECIMEN RADIOGRAPH OF THE LEFT BREAST COMPARISON:  Previous exam(s). FINDINGS: Status post excision of the LEFT breast. The radioactive seed and the ribbon shaped biopsy marker clip are present, completely intact, and are marked for pathology. This was discussed directly with the operating room nurse at time of interpretation on 08/22/2018 11:15 a.m. IMPRESSION: Specimen radiograph of the LEFT breast. Electronically Signed   By: Evangeline Dakin M.D.   On: 09/01/2018 11:22   [place] Lt Radioactive Seed Loc  Result Date: 08/31/2018 CLINICAL DATA:  Localization of the patient's known breast malignancy under ultrasound. EXAM: ULTRASOUND GUIDED RADIOACTIVE SEED LOCALIZATION OF THE LEFT BREAST COMPARISON:  Previous exam(s). FINDINGS: Patient presents for radioactive seed localization prior to surgery. I met with the patient and we discussed the procedure of seed localization including benefits  and alternatives. We discussed the high likelihood of a successful procedure. We discussed the risks of the procedure including infection, bleeding, tissue injury and further surgery. We discussed the low dose of radioactivity involved in the procedure. Informed, written consent was given. The usual time-out protocol was performed immediately prior to the procedure. Using ultrasound guidance, sterile technique, 1% lidocaine and an I-125 radioactive seed, the 7 o'clock left breast mass was localized using a lateral approach. The follow-up mammogram images confirm the seed in the expected location and were marked for the surgeon. Follow-up survey of the patient confirms presence of the radioactive seed. Order number of I-125 seed:  [id number]. Total activity:  0.250 reference Date: August 11, 2018 The patient tolerated the procedure well and was released from the [facility]. She was given instructions regarding seed removal. IMPRESSION: Radioactive seed localization left breast. No apparent complications. Electronically Signed   By: Dorise Bullion III M.D   On: 08/31/2018 16:02   Mm Clip Placement Left  Result Date: 08/31/2018 CLINICAL DATA:  Localization of  the patient's known breast malignancy under ultrasound. EXAM: ULTRASOUND GUIDED RADIOACTIVE SEED LOCALIZATION OF THE LEFT BREAST COMPARISON:  Previous exam(s). FINDINGS: Patient presents for radioactive seed localization prior to surgery. I met with the patient and we discussed the procedure of seed localization including benefits and alternatives. We discussed the high likelihood of a successful procedure. We discussed the risks of the procedure including infection, bleeding, tissue injury and further surgery. We discussed the low dose of radioactivity involved in the procedure. Informed, written consent was given. The usual time-out protocol was performed immediately prior to the procedure. Using ultrasound guidance, sterile technique, 1% lidocaine and  an I-125 radioactive seed, the 7 o'clock left breast mass was localized using a lateral approach. The follow-up mammogram images confirm the seed in the expected location and were marked for the surgeon. Follow-up survey of the patient confirms presence of the radioactive seed. Order number of I-125 seed:  [id number]. Total activity:  0.250 reference Date: August 11, 2018 The patient tolerated the procedure well and was released from the [facility]. She was given instructions regarding seed removal. IMPRESSION: Radioactive seed localization left breast. No apparent complications. Electronically Signed   By: Dorise Bullion III M.D   On: 08/31/2018 16:02     ELIGIBLE FOR AVAILABLE RESEARCH PROTOCOL: no   ASSESSMENT: 73 y.o. [place], [place] woman status post left breast lower inner quadrant biopsy 08/01/2018 for a clinical T1c N0, stage IB invasive ductal carcinoma, grade 2, triple negative, with an MIB-1 of 40%  (1) genetics testing 08/29/2018 through with Hereditary Gene Panel offered by [facility] found no deleterious mutations in APC, ATM, AXIN2, BARD1, BMPR1A, BRCA1, BRCA2, BRIP1, CDH1, CDK4, CDKN2A (p14ARF), CDKN2A (p16INK4a), CHEK2, CTNNA1, DICER1, EPCAM (Deletion/duplication testing only), GREM1 (promoter region deletion/duplication testing only), KIT, MEN1, MLH1, MSH2, MSH3, MSH6, MUTYH, NBN, NF1, NHTL1, PALB2, PDGFRA, PMS2, POLD1, POLE, PTEN, RAD50, RAD51C, RAD51D, SDHB, SDHC, SDHD, SMAD4, SMARCA4. STK11, TP53, TSC1, TSC2, and VHL.  The following genes were evaluated for sequence changes only: SDHA and HOXB13 c.251G>A variant only.   (a) MLH1 c.1890T>G VUS identified  (2) status post left lumpectomy with sentinel lymph node sampling 09/01/2022 a pT1c pN0, stage IB invasive ductal carcinoma, grade 2, with negative margins.  (a) a total of 3 sentinel lymph nodes were removed  (3) chemotherapy will consist of cyclophosphamide, methotrexate, and fluorouracil (CMF), repeated every 21 days x 8,  starting 10/24/2018  (4) adjuvant radiation to be given concurrently with cycles 3-4-5 of chemotherapy   PLAN: I reviewed the surgical results with Emmamae and her daughter.  They understand the patient had a small, node-negative tumor which was stage I.  Unfortunately this is triple negative.  This means that the only way to address the possibility of systemic spread of this tumor is with chemotherapy.  They understand that surgery and radiation are local forms of treatment and she will need radiation postlumpectomy of course to reduce the risk of local recurrence.  Of course radiation and surgery do not address the risk of this cancer already having spread to other parts of the body and an occult or microscopic fashion  Given her cardiac history I suggested CMF chemotherapy.  They understand that this can cause hair loss although most of the time it does not.  It can cause fatigue and low blood counts and nausea, but again these are usually pretty mild.  Most patients get a port placed.  She is going to try the first cycle by vein and then if she  wishes she may have a port placed for subsequent cycles  We will try to get the radiation started around the time of cycle #3 or between cycles 3 and 4.  Given the problem with the current pandemic, we are going to wait on the chemotherapy until what we expect to be the "peak" of the local infection rate will have passed.  She will see me 10/20/2018 and start her chemotherapy 10/24/2018  They know to call for any other issue that may develop before the next visit here.  Victorine Mcnee, Virgie Dad, MD  09/29/18 10:45 AM Medical Oncology and Hematology [facility] [address] [place],  [address] Tel. [phone]    Fax. [phone]

## 2018-10-02 ENCOUNTER — Telehealth: Payer: Self-pay | Admitting: Oncology

## 2018-10-02 NOTE — Telephone Encounter (Signed)
Tried to reach regarding schedule °

## 2018-10-03 ENCOUNTER — Telehealth: Payer: Self-pay | Admitting: Oncology

## 2018-10-03 NOTE — Telephone Encounter (Signed)
Canceled appt per 4/13 sch message - left message for patient that appt was cancelled .

## 2018-10-09 ENCOUNTER — Encounter: Payer: Medicare HMO | Admitting: Genetic Counselor

## 2018-10-09 ENCOUNTER — Ambulatory Visit: Payer: Medicare HMO | Admitting: Oncology

## 2018-10-09 ENCOUNTER — Other Ambulatory Visit: Payer: Medicare HMO

## 2018-10-19 ENCOUNTER — Telehealth: Payer: Self-pay | Admitting: Oncology

## 2018-10-19 NOTE — Telephone Encounter (Signed)
Calling regarding upcoming Webex appointment, patient feels more comfortable with this being a face to face visit rather than doing Webex. In person visit okay per provider.

## 2018-10-19 NOTE — Progress Notes (Signed)
[facility]  [phone] [phone]    ID: Gloria Mccoy DOB: 12/29/1945  MR#: [id number]  XAJ#:[id number]  Patient Care Team: Redmond School, MD as PCP - General (Internal Medicine) Harl Bowie, Alphonse Guild, MD as PCP - Cardiology (Cardiology) Magrinat, Virgie Dad, MD as Consulting Physician (Oncology) Kyung Rudd, MD as Consulting Physician (Radiation Oncology) Rutherford Guys, MD as Consulting Physician (Ophthalmology) Jovita Kussmaul, MD as Consulting Physician (General Surgery) Mauro Kaufmann, RN as Oncology Nurse Navigator Rockwell Germany, RN as Oncology Nurse Navigator OTHER MD:    CHIEF COMPLAINT: Triple negative breast cancer  CURRENT TREATMENT: Adjuvant chemotherapy    HISTORY OF CURRENT ILLNESS: From the original intake note:  Gloria Mccoy had routine screening mammography on 07/12/2018 showing a possible abnormality in the left breast. She underwent unilateral left diagnostic mammography with tomography and left breast ultrasonography at [facility] on 07/25/2018 showing: Breast Density Category B. There is a mass with indistinct margins in the lower inner posterior left breast measuring approximately 0.6 cm. Targeted ultrasound of the lower left breast was performed. There is a hypoechoic mass with margin irregularity in the left breast at 7 o'clock, 7 cm from the nipple measuring 0.7 x 0.5 x 0.6 cm. No lymphadenopathy seen in the left axilla.   Accordingly on 08/01/2018 she proceeded to biopsy of the left breast area in question. The pathology from this procedure showed ([id number]): invasive ductal carcinoma, grade II. Prognostic indicators significant for: estrogen receptor, 0% negative and progesterone receptor, 0% negative. Proliferation marker Ki67 at 40%. HER2 negative (1+) by immunohistochemistry.   The patient's subsequent history is as detailed below.   INTERVAL HISTORY: Gloria Mccoy was seen today for follow-up and treatment  of her triple negative breast cancer.   She will begin chemotherapy consisting of cyclophosphamide, methotrexate, and fluorouracil (CMF), repeated every 21 days x 8, starting 10/24/2018.   Since her last visit here, she has not undergone any additional studies.     REVIEW OF SYSTEMS: Gloria Mccoy has been isolating herself as part of the COVID-19 pandemic. She is taking appropriate precautions against the viral spread. She lives with her husband and daughter. For exercise, she has been working out in her yard. The patient denies unusual headaches, visual changes, nausea, vomiting, or dizziness. There has been no unusual cough, phlegm production, or pleurisy. This been no change in bowel or bladder habits. The patient denies unexplained fatigue or unexplained weight loss, bleeding, rash, or fever. A detailed review of systems was otherwise noncontributory.    PAST MEDICAL HISTORY: Past Medical History:  Diagnosis Date  . Arthritis    "hands sometimes" (07/13/2017)  . Coronary artery disease    a. s/p CABG x3 in 06/2017 with LIMA-LAD, SVG-D1, and SVG-RI.  b. 10/2017: cath showing a widely patent LIMA-LAD with ostial occlusion of the SVG-RI and subtotally occluded atretic SVG-D1. Graft occlusion thought to be 2ry to improvement in pre-CABG stenoses.   . Essential hypertension   . Family history of adverse reaction to anesthesia    sister had a complication that was stated she had a "foggy" episode after her breast surgery was readmitted 1 day post op after being discharged from the hospital. Sister also has significant lung problems that could have contributed to this   . Family history of breast cancer   . Family history of colon cancer   . Hypothyroid   . Meniere disease   . Myocardial infarction ([place]) 06/2017   during cardaic rehab  .  Obesity   . Osteopenia 01/2012   T score -1.3 FRAX 7.9%/0.6%     PAST SURGICAL HISTORY: Past Surgical History:  Procedure Laterality Date  . BREAST  LUMPECTOMY WITH RADIOACTIVE SEED AND SENTINEL LYMPH NODE BIOPSY Left 09/01/2018   Procedure: LEFT BREAST LUMPECTOMY WITH RADIOACTIVE SEED AND SENTINEL LYMPH NODE BIOPSY;  Surgeon: Toth, Paul III, MD;  Location: [facility] OR;  Service: General;  Laterality: Left;  . CARDIAC CATHETERIZATION  07/13/2017  . CATARACT EXTRACTION W/PHACO Right 01/28/2015   Procedure: CATARACT EXTRACTION PHACO AND INTRAOCULAR LENS PLACEMENT :  CDE:  5.70;  Surgeon: Mark Shapiro, MD;  Location: [facility] ORS;  Service: Ophthalmology;  Laterality: Right;  . CATARACT EXTRACTION W/PHACO Left 02/11/2015   Procedure: CATARACT EXTRACTION PHACO AND INTRAOCULAR LENS PLACEMENT (IOC);  Surgeon: Mark Shapiro, MD;  Location: [facility]S;  Service: Ophthalmology;  Laterality: Left;  CDE: 7.38  . COLONOSCOPY N/A 09/26/2012   Procedure: COLONOSCOPY;  Surgeon: Mark A Jenkins, MD;  Location: [facility];  Service: Gastroenterology;  Laterality: N/A;  . CORONARY ARTERY BYPASS GRAFT N/A 07/15/2017   Procedure: CORONARY ARTERY BYPASS GRAFTING (CABG) X 3 USING LEFT INTERNAL MAMMARY ARTERY AND RIGHT SAPHENOUS VEIN- ENDOSCOPICALLY HARVESTED;  Surgeon: Hendrickson, Steven C, MD;  Location: [facility] OR;  Service: Open Heart Surgery;  Laterality: N/A;  . LEFT HEART CATH AND CORONARY ANGIOGRAPHY N/A 07/13/2017   Procedure: LEFT HEART CATH AND CORONARY ANGIOGRAPHY;  Surgeon: Jordan, Peter M, MD;  Location: [facility] INVASIVE CV LAB;  Service: Cardiovascular;  Laterality: N/A;  . LEFT HEART CATH AND CORS/GRAFTS ANGIOGRAPHY N/A 11/03/2017   Procedure: LEFT HEART CATH AND CORS/GRAFTS ANGIOGRAPHY;  Surgeon: Kelly, Thomas A, MD;  Location[facility];  Service: Cardiovascular;  Laterality: N/A;  . TEE WITHOUT CARDIOVERSION N/A 07/15/2017   Procedure: TRANSESOPHAGEAL ECHOCARDIOGRAM (TEE);  Surgeon: Hendrickson, Steven C, MD;  Location: [facility] OR;  Service: Open Heart Surgery;  Laterality: N/A;  . TUBAL LIGATION    . TYMPANOPLASTY Left    fluid from ear drum     FAMILY HISTORY: Family  History  Problem Relation Age of Onset  . Diabetes Sister        AODM  . Heart disease Sister 36       CABG  . Breast cancer Sister   . Heart disease Brother 55       In his 50s  . Colon cancer Maternal Uncle 60  . Diabetes Sister        AODM  . Hypertension Daughter   . Hypertension Daughter   . Breast cancer Cousin        PATERNAL COUSIN  . Heart attack Father        In his 60s  . Stroke Mother   . Stroke Maternal Grandmother   . Diabetes Maternal Grandfather        d. 109  . Heart attack Paternal Grandmother   . Colon cancer Maternal Uncle 77  . Breast cancer Cousin        dx in her 60s; mat first cousin  . Breast cancer Cousin        dx 50s; d. 60s  . Breast cancer Niece 39   Gloria Mccoy's father died from a myocardial infarction at age 65. Patients' mother died from a stroke at age 66. The patient has 3 brothers and 9 sisters. One of Gloria Mccoy's sisters, Elizabeth, was diagnosed with breast cancer at the age of 65. Gloria Mccoy also has a niece that was diagnosed with breast cancer   at 39, and two cousins that were diagnosed with breast cancer.  She believes 1 of her brothers may have prostate cancer.  Patient denies anyone in her family having ovarian or pancreatic cancer. Gloria Mccoy has an uncle that was diagnosed with colon cancer and an uncle that was diagnosed with "stomach" cancer.    GYNECOLOGIC HISTORY:  No LMP recorded. Patient is postmenopausal. 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 Her husband, Percy, is a retired truck driver. They have two daughters. Their daughter, Gloria Mccoy, lives in [place] and is a housewife.  Their other daughter works at [employer]. Gloria Mccoy had one grandchild that is now deceased. She attends a [employer].    ADVANCED DIRECTIVES: Gloria Mccoy's husband, Percy, is  automatically her healthcare power of attorney.     HEALTH MAINTENANCE: Social History   Tobacco Use  . Smoking status: Former Smoker  . Smokeless tobacco: Former User    Types: Snuff    Quit date: 07/31/2017  Substance Use Topics  . Alcohol use: Yes    Alcohol/week: 1.0 standard drinks    Types: 1 Standard drinks or equivalent per week    Comment: occasionally  . Drug use: No    Colonoscopy: yes, Dr. Jenkins  PAP:   Bone density: yes, 2016, -1.1 osteopenic   Allergies  Allergen Reactions  . Sulfa Antibiotics Shortness Of Breath  . Sulfasalazine Shortness Of Breath    Current Outpatient Medications  Medication Sig Dispense Refill  . acetaminophen (TYLENOL) 325 MG tablet Take 2 tablets (650 mg total) by mouth every 6 (six) hours as needed for mild pain.    . aspirin EC 81 MG tablet Take 81 mg by mouth daily.    . atorvastatin (LIPITOR) 80 MG tablet Take 1 tablet (80 mg total) by mouth daily at 6 PM. 90 tablet 3  . Cholecalciferol (VITAMIN D PO) Take 1 tablet by mouth daily.     . clopidogrel (PLAVIX) 75 MG tablet Take 1 tablet (75 mg total) by mouth daily. 90 tablet 3  . Cyanocobalamin (VITAMIN B-12 PO) Take 1 tablet by mouth daily.    . dexamethasone (DECADRON) 4 MG tablet Take 1 tablet (4 mg total) by mouth daily. Start the day after chemotherapy for 2 days. Take with food. 30 tablet 1  . fish oil-omega-3 fatty acids 1000 MG capsule Take 1 g by mouth daily.    . furosemide (LASIX) 40 MG tablet Take 1 tablet (40 mg total) by mouth daily. 30 tablet 1  . HYDROcodone-acetaminophen (NORCO/VICODIN) 5-325 MG tablet Take 1-2 tablets by mouth every 6 (six) hours as needed for moderate pain or severe pain. 10 tablet 0  . levothyroxine (SYNTHROID, LEVOTHROID) 75 MCG tablet Take 75 mcg by mouth daily.    . losartan (COZAAR) 25 MG tablet Take 0.5 tablets (12.5 mg total) by mouth daily. 45 tablet 3  . metoprolol tartrate (LOPRESSOR) 25 MG tablet Take 1 tablet (25 mg total) by mouth 2  (two) times daily. 180 tablet 3  . prochlorperazine (COMPAZINE) 10 MG tablet Take 1 tablet (10 mg total) by mouth every 8 (eight) hours as needed (Nausea or vomiting). 30 tablet 1  . VITAMIN E PO Take 1 tablet by mouth daily.      No current facility-administered medications for this visit.        OBJECTIVE: Middle-aged African-American woman who appears stated age 65:   10/20/18 0933  BP: (!) 163/70  Pulse: 64  Resp: 19  Temp: (!) 97.3 F (36.3 C)  SpO2: 97%     Body mass index is 36.88 kg/m.   Wt Readings from Last 3 Encounters:  10/20/18 208 lb 3.2 oz (94.4 kg)  09/01/18 210 lb 8 oz (95.5 kg)  08/29/18 210 lb 8 oz (95.5 kg)      ECOG FS:1 - Symptomatic but completely ambulatory Sclerae unicteric, EOMs intact No cervical or supraclavicular adenopathy Lungs no rales or rhonchi Heart regular rate and rhythm Abd soft, nontender, positive bowel sounds MSK no focal spinal tenderness, no upper extremity lymphedema Neuro: nonfocal, well oriented, appropriate affect Breasts: The right breast is unremarkable.  The left breast is status post lumpectomy.  The cosmetic result is excellent.  There is no erythema, swelling, or significant induration.  Both axillae are benign.    LAB RESULTS:  CMP     Component Value Date/Time   NA 139 08/29/2018 0855   K 3.8 08/29/2018 0855   CL 107 08/29/2018 0855   CO2 23 08/29/2018 0855   GLUCOSE 112 (H) 08/29/2018 0855   BUN 12 08/29/2018 0855   CREATININE 0.93 08/29/2018 0855   CALCIUM 9.1 08/29/2018 0855   PROT 7.6 11/18/2017 0935   ALBUMIN 3.6 11/18/2017 0935   AST 18 11/18/2017 0935   ALT 14 11/18/2017 0935   ALKPHOS 65 11/18/2017 0935   BILITOT 0.7 11/18/2017 0935   GFRNONAA >60 08/29/2018 0855   GFRAA >60 08/29/2018 0855    No results found for: TOTALPROTELP, ALBUMINELP, A1GS, A2GS, BETS, BETA2SER, GAMS, MSPIKE, SPEI  No results found for: KPAFRELGTCHN, LAMBDASER, KAPLAMBRATIO  Lab Results  Component Value Date   WBC  10.6 (H) 08/29/2018   NEUTROABS 4.6 11/02/2017   HGB 13.7 08/29/2018   HCT 38.9 08/29/2018   MCV 84.4 08/29/2018   PLT 271 08/29/2018    _0 @  No results found for: LABCA2  No components found for: BHALPF790  No results for input(s): INR in the last 168 hours.  No results found for: LABCA2  No results found for: WIO973  No results found for: ZHG992  No results found for: EQA834  No results found for: CA2729  No components found for: HGQUANT  No results found for: CEA1 / No results found for: CEA1   No results found for: AFPTUMOR  No results found for: CHROMOGRNA  No results found for: PSA1  No visits with results within 3 Day(s) from this visit.  Latest known visit with results is:  Hospital Outpatient Visit on 08/29/2018  Component Date Value Ref Range Status  . WBC 08/29/2018 10.6* 4.0 - 10.5 K/uL Final  . RBC 08/29/2018 4.61  3.87 - 5.11 MIL/uL Final  . Hemoglobin 08/29/2018 13.7  12.0 - 15.0 g/dL Final  . HCT 08/29/2018 38.9  36.0 - 46.0 % Final  . MCV 08/29/2018 84.4  80.0 - 100.0 fL Final  . MCH 08/29/2018 29.7  26.0 - 34.0 pg Final  . MCHC 08/29/2018 35.2  30.0 - 36.0 g/dL Final  . RDW 08/29/2018 13.9  11.5 - 15.5 % Final  . Platelets 08/29/2018 271  150 - 400 K/uL Final  . nRBC 08/29/2018 0.0  0.0 - 0.2 % Final   Performed at [facility], [place] [address] [place], [place] [address]  . Sodium 08/29/2018 139  135 - 145 mmol/L Final  . Potassium 08/29/2018 3.8  3.5 - 5.1 mmol/L Final  . Chloride 08/29/2018 107  98 - 111 mmol/L Final  . CO2 08/29/2018 23  22 - 32 mmol/L Final  . Glucose, Bld 08/29/2018 112* 70 - 99 mg/dL Final  . BUN 08/29/2018 12  8 - 23 mg/dL Final  . Creatinine, Ser 08/29/2018 0.93  0.44 - 1.00 mg/dL Final  . Calcium 08/29/2018 9.1  8.9 - 10.3 mg/dL Final  . GFR calc non Af Amer 08/29/2018 >60  >60 mL/min Final  . GFR calc Af Amer 08/29/2018 >60  >60 mL/min Final  . Anion gap 08/29/2018 9  5 - 15 Final   Performed  at [facility], [address] [place], [place] [address]    (this displays the last labs from the last 3 days)  No results found for: TOTALPROTELP, ALBUMINELP, A1GS, A2GS, BETS, BETA2SER, GAMS, MSPIKE, SPEI (this displays SPEP labs)  No results found for: KPAFRELGTCHN, LAMBDASER, KAPLAMBRATIO (kappa/lambda light chains)  No results found for: HGBA, HGBA2QUANT, HGBFQUANT, HGBSQUAN (Hemoglobinopathy evaluation)   No results found for: LDH  No results found for: IRON, TIBC, IRONPCTSAT (Iron and TIBC)  No results found for: FERRITIN  Urinalysis    Component Value Date/Time   COLORURINE YELLOW 07/14/2017 2015   APPEARANCEUR HAZY (A) 07/14/2017 2015   LABSPEC 1.011 07/14/2017 2015   PHURINE 6.0 07/14/2017 2015   GLUCOSEU NEGATIVE 07/14/2017 2015   HGBUR NEGATIVE 07/14/2017 2015   BILIRUBINUR NEGATIVE 07/14/2017 2015   KETONESUR NEGATIVE 07/14/2017 2015   PROTEINUR NEGATIVE 07/14/2017 2015   UROBILINOGEN 0.2 07/03/2014 0852   NITRITE NEGATIVE 07/14/2017 2015   LEUKOCYTESUR LARGE (A) 07/14/2017 2015     STUDIES:  No results found.   ELIGIBLE FOR AVAILABLE RESEARCH PROTOCOL: no   ASSESSMENT: 72 y.o. [place],  woman status post left breast lower inner quadrant biopsy 08/01/2018 for a clinical T1c N0, stage IB invasive ductal carcinoma, grade 2, triple negative, with an MIB-1 of 40%  (1) genetics testing 08/29/2018 through with Hereditary Gene Panel offered by [facility] found no deleterious mutations in APC, ATM, AXIN2, BARD1, BMPR1A, BRCA1, BRCA2, BRIP1, CDH1, CDK4, CDKN2A (p14ARF), CDKN2A (p16INK4a), CHEK2, CTNNA1, DICER1, EPCAM (Deletion/duplication testing only), GREM1 (promoter region deletion/duplication testing only), KIT, MEN1, MLH1, MSH2, MSH3, MSH6, MUTYH, NBN, NF1, NHTL1, PALB2, PDGFRA, PMS2, POLD1, POLE, PTEN, RAD50, RAD51C, RAD51D, SDHB, SDHC, SDHD, SMAD4, SMARCA4. STK11, TP53, TSC1, TSC2, and VHL.  The following genes were evaluated for sequence changes  only: SDHA and HOXB13 c.251G>A variant only.   (a) MLH1 c.1890T>G VUS identified  (2) status post left lumpectomy with sentinel lymph node sampling 09/01/2022 a pT1c pN0, stage IB invasive ductal carcinoma, grade 2, with negative margins.  (a) a total of 3 sentinel lymph nodes were removed  (3) chemotherapy will consist of cyclophosphamide, methotrexate, and fluorouracil (CMF), repeated every 21 days x 8, starting 10/24/2018  (4) adjuvant radiation to be given concurrently with cycles 3-4-5 of chemotherapy   PLAN: Kasumi has recovered well from her surgery and she is ready to start chemotherapy next Tuesday, 10/24/2018.  She has a good understanding of the possible toxicities side effects and complications of CMF chemotherapy.    This type of chemotherapy frequently causes very minimal nausea so I am increasing her nausea medicine treatment Compazine to take before meals days 2 and 3 only, and dexamethasone 4 mg to take before breakfast only days 2 and 3 as well.  I am hopeful that the supportive medicines will not cause her any side effects   and yet will allow her to continue with as normal activities of daily living as possible.  I am going to see her between the first and second treatments just to make sure she has no unusual side effects  Note that she does not have a port.  If she has any phlebitis or concerns regarding access we can always get that Coumadin before cycle #2.  Beginning with cycle #3 she can start adjuvant radiation concurrent with the treatment.  She knows to call for any other issues that may develop before her next visit here.    Magrinat, Gustav C, MD  10/20/18 10:14 AM Medical Oncology and Hematology [facility] [address] [place],  [address] Tel. [phone]    Fax. [phone]   I, Amber Handy am acting as a scribe for Gustav C, Magrinat, MD.   I, Gustav Magrinat MD, have reviewed the above documentation for accuracy and  completeness, and I agree with the above.       

## 2018-10-20 ENCOUNTER — Inpatient Hospital Stay: Payer: Medicare HMO | Attending: Genetic Counselor | Admitting: Oncology

## 2018-10-20 ENCOUNTER — Other Ambulatory Visit: Payer: Self-pay

## 2018-10-20 ENCOUNTER — Telehealth: Payer: Self-pay | Admitting: Oncology

## 2018-10-20 VITALS — BP 163/70 | HR 64 | Temp 97.3°F | Resp 19 | Ht 63.0 in | Wt 208.2 lb

## 2018-10-20 DIAGNOSIS — Z7982 Long term (current) use of aspirin: Secondary | ICD-10-CM | POA: Diagnosis not present

## 2018-10-20 DIAGNOSIS — Z8249 Family history of ischemic heart disease and other diseases of the circulatory system: Secondary | ICD-10-CM | POA: Insufficient documentation

## 2018-10-20 DIAGNOSIS — Z79899 Other long term (current) drug therapy: Secondary | ICD-10-CM | POA: Insufficient documentation

## 2018-10-20 DIAGNOSIS — Z5111 Encounter for antineoplastic chemotherapy: Secondary | ICD-10-CM | POA: Diagnosis not present

## 2018-10-20 DIAGNOSIS — K5903 Drug induced constipation: Secondary | ICD-10-CM | POA: Diagnosis not present

## 2018-10-20 DIAGNOSIS — Z87891 Personal history of nicotine dependence: Secondary | ICD-10-CM | POA: Insufficient documentation

## 2018-10-20 DIAGNOSIS — C50312 Malignant neoplasm of lower-inner quadrant of left female breast: Secondary | ICD-10-CM | POA: Diagnosis not present

## 2018-10-20 DIAGNOSIS — Z171 Estrogen receptor negative status [ER-]: Secondary | ICD-10-CM

## 2018-10-20 DIAGNOSIS — C50512 Malignant neoplasm of lower-outer quadrant of left female breast: Secondary | ICD-10-CM | POA: Diagnosis not present

## 2018-10-20 DIAGNOSIS — Z803 Family history of malignant neoplasm of breast: Secondary | ICD-10-CM | POA: Insufficient documentation

## 2018-10-20 DIAGNOSIS — I10 Essential (primary) hypertension: Secondary | ICD-10-CM | POA: Diagnosis not present

## 2018-10-20 MED ORDER — PROCHLORPERAZINE MALEATE 10 MG PO TABS: 10.0000 mg | ORAL_TABLET | Freq: Three times a day (TID) | ORAL | 1 refills | Status: DC

## 2018-10-20 MED ORDER — DEXAMETHASONE 4 MG PO TABS: 4.0000 mg | ORAL_TABLET | Freq: Every day | ORAL | 1 refills | Status: DC

## 2018-10-20 MED ORDER — PROCHLORPERAZINE MALEATE 10 MG PO TABS: 10.0000 mg | ORAL_TABLET | Freq: Three times a day (TID) | ORAL | 1 refills | Status: DC | PRN

## 2018-10-20 NOTE — Telephone Encounter (Signed)
Scheduled appt per 5/1 sch message - unable to reach patient. Left message with appt date and time   

## 2018-10-23 ENCOUNTER — Other Ambulatory Visit: Payer: Self-pay

## 2018-10-23 ENCOUNTER — Inpatient Hospital Stay: Payer: Medicare HMO

## 2018-10-23 ENCOUNTER — Encounter: Payer: Self-pay | Admitting: Oncology

## 2018-10-23 ENCOUNTER — Telehealth: Payer: Self-pay | Admitting: *Deleted

## 2018-10-23 NOTE — Progress Notes (Signed)
Called pt to introduce myself as her Arboriculturist and to discuss copay assistance and the [employer].  Left a msg requesting she return my call at her earliest convenience.  If I don't hear from her I will plan to meet her on 10/24/18.

## 2018-10-24 ENCOUNTER — Inpatient Hospital Stay: Payer: Medicare HMO

## 2018-10-24 ENCOUNTER — Other Ambulatory Visit: Payer: Self-pay

## 2018-10-24 VITALS — BP 155/64 | HR 64 | Temp 98.7°F | Resp 18 | Wt 208.5 lb

## 2018-10-24 DIAGNOSIS — C50512 Malignant neoplasm of lower-outer quadrant of left female breast: Secondary | ICD-10-CM

## 2018-10-24 DIAGNOSIS — Z8249 Family history of ischemic heart disease and other diseases of the circulatory system: Secondary | ICD-10-CM | POA: Diagnosis not present

## 2018-10-24 DIAGNOSIS — Z79899 Other long term (current) drug therapy: Secondary | ICD-10-CM | POA: Diagnosis not present

## 2018-10-24 DIAGNOSIS — Z171 Estrogen receptor negative status [ER-]: Secondary | ICD-10-CM | POA: Diagnosis not present

## 2018-10-24 DIAGNOSIS — Z87891 Personal history of nicotine dependence: Secondary | ICD-10-CM | POA: Diagnosis not present

## 2018-10-24 DIAGNOSIS — I10 Essential (primary) hypertension: Secondary | ICD-10-CM | POA: Diagnosis not present

## 2018-10-24 DIAGNOSIS — C50312 Malignant neoplasm of lower-inner quadrant of left female breast: Secondary | ICD-10-CM | POA: Diagnosis not present

## 2018-10-24 DIAGNOSIS — Z7982 Long term (current) use of aspirin: Secondary | ICD-10-CM | POA: Diagnosis not present

## 2018-10-24 DIAGNOSIS — Z5111 Encounter for antineoplastic chemotherapy: Secondary | ICD-10-CM | POA: Diagnosis not present

## 2018-10-24 DIAGNOSIS — K5903 Drug induced constipation: Secondary | ICD-10-CM | POA: Diagnosis not present

## 2018-10-24 LAB — CBC WITH DIFFERENTIAL/PLATELET
Abs Immature Granulocytes: 0.03 10*3/uL (ref 0.00–0.07)
Basophils Absolute: 0.1 10*3/uL (ref 0.0–0.1)
Basophils Relative: 1 %
Eosinophils Absolute: 0.3 10*3/uL (ref 0.0–0.5)
Eosinophils Relative: 3 %
HCT: 37.5 % (ref 36.0–46.0)
Hemoglobin: 13.3 g/dL (ref 12.0–15.0)
Immature Granulocytes: 0 %
Lymphocytes Relative: 32 %
Lymphs Abs: 3.5 10*3/uL (ref 0.7–4.0)
MCH: 29.5 pg (ref 26.0–34.0)
MCHC: 35.5 g/dL (ref 30.0–36.0)
MCV: 83.1 fL (ref 80.0–100.0)
Monocytes Absolute: 1 10*3/uL (ref 0.1–1.0)
Monocytes Relative: 9 %
Neutro Abs: 6.1 10*3/uL (ref 1.7–7.7)
Neutrophils Relative %: 55 %
Platelets: 232 10*3/uL (ref 150–400)
RBC: 4.51 MIL/uL (ref 3.87–5.11)
RDW: 14 % (ref 11.5–15.5)
WBC: 11.1 10*3/uL — ABNORMAL HIGH (ref 4.0–10.5)
nRBC: 0 % (ref 0.0–0.2)

## 2018-10-24 LAB — COMPREHENSIVE METABOLIC PANEL
ALT: 18 U/L (ref 0–44)
AST: 18 U/L (ref 15–41)
Albumin: 3.5 g/dL (ref 3.5–5.0)
Alkaline Phosphatase: 74 U/L (ref 38–126)
Anion gap: 9 (ref 5–15)
BUN: 12 mg/dL (ref 8–23)
CO2: 27 mmol/L (ref 22–32)
Calcium: 9 mg/dL (ref 8.9–10.3)
Chloride: 106 mmol/L (ref 98–111)
Creatinine, Ser: 0.91 mg/dL (ref 0.44–1.00)
GFR calc Af Amer: >60 mL/min (ref >60)
GFR calc non Af Amer: >60 mL/min (ref >60)
Glucose, Bld: 129 mg/dL — ABNORMAL HIGH (ref 70–99)
Potassium: 3.6 mmol/L (ref 3.5–5.1)
Sodium: 142 mmol/L (ref 135–145)
Total Bilirubin: 0.9 mg/dL (ref 0.3–1.2)
Total Protein: 7.4 g/dL (ref 6.5–8.1)

## 2018-10-24 MED ORDER — FLUOROURACIL CHEMO INJECTION 2.5 GM/50ML
600.0000 mg/m2 | Freq: Once | INTRAVENOUS | Status: AC
  Administered 2018-10-24: 11:00:00 1200 mg via INTRAVENOUS
  Filled 2018-10-24: qty 24

## 2018-10-24 MED ORDER — SODIUM CHLORIDE 0.9 % IV SOLN
Freq: Once | INTRAVENOUS | Status: AC
  Administered 2018-10-24: 09:00:00 via INTRAVENOUS
  Filled 2018-10-24: qty 250

## 2018-10-24 MED ORDER — SODIUM CHLORIDE 0.9 % IV SOLN: 10.0000 mg | Freq: Once | INTRAVENOUS | Status: DC

## 2018-10-24 MED ORDER — METHOTREXATE SODIUM (PF) CHEMO INJECTION 250 MG/10ML
39.8000 mg/m2 | Freq: Once | INTRAMUSCULAR | Status: AC
  Administered 2018-10-24: 11:00:00 80 mg via INTRAVENOUS
  Filled 2018-10-24: qty 3.2

## 2018-10-24 MED ORDER — DEXAMETHASONE SODIUM PHOSPHATE 10 MG/ML IJ SOLN
INTRAMUSCULAR | Status: AC
  Filled 2018-10-24: qty 1

## 2018-10-24 MED ORDER — SODIUM CHLORIDE 0.9 % IV SOLN
600.0000 mg/m2 | Freq: Once | INTRAVENOUS | Status: AC
  Administered 2018-10-24: 1200 mg via INTRAVENOUS
  Filled 2018-10-24: qty 60

## 2018-10-24 MED ORDER — PALONOSETRON HCL INJECTION 0.25 MG/5ML
INTRAVENOUS | Status: AC
  Filled 2018-10-24: qty 5

## 2018-10-24 MED ORDER — PALONOSETRON HCL INJECTION 0.25 MG/5ML
0.2500 mg | Freq: Once | INTRAVENOUS | Status: AC
  Administered 2018-10-24: 0.25 mg via INTRAVENOUS

## 2018-10-24 MED ORDER — DEXAMETHASONE SODIUM PHOSPHATE 10 MG/ML IJ SOLN
10.0000 mg | Freq: Once | INTRAMUSCULAR | Status: AC
  Administered 2018-10-24: 10:00:00 10 mg via INTRAVENOUS

## 2018-10-24 NOTE — Patient Instructions (Signed)
[facility] Discharge Instructions for Patients Receiving Chemotherapy  Today you received the following chemotherapy agents Cytoxan, methotrexate and Adrucil (5FU).   To help prevent nausea and vomiting after your treatment, we encourage you to take your nausea medication as directed BUT NO ZOFRAN FOR 3 DAYS AFTER CHEMO. If you develop nausea and vomiting that is not controlled by your nausea medication, call the clinic.   BELOW ARE SYMPTOMS THAT SHOULD BE REPORTED IMMEDIATELY:  *FEVER GREATER THAN 100.5 F  *CHILLS WITH OR WITHOUT FEVER  NAUSEA AND VOMITING THAT IS NOT CONTROLLED WITH YOUR NAUSEA MEDICATION  *UNUSUAL SHORTNESS OF BREATH  *UNUSUAL BRUISING OR BLEEDING  TENDERNESS IN MOUTH AND THROAT WITH OR WITHOUT PRESENCE OF ULCERS  *URINARY PROBLEMS  *BOWEL PROBLEMS  UNUSUAL RASH Items with * indicate a potential emergency and should be followed up as soon as possible.  Feel free to call the clinic you have any questions or concerns. The clinic phone number is [phone].  Please show the [facility] at check-in to the Emergency Department and triage nurse.

## 2018-10-25 ENCOUNTER — Telehealth: Payer: Self-pay

## 2018-10-25 ENCOUNTER — Telehealth: Payer: Self-pay | Admitting: *Deleted

## 2018-10-25 NOTE — Telephone Encounter (Signed)
Nurse placed call to follow up with first time CMF treatment.  Patient has no complaints of nausea, or vomiting.  Tolerating food and fluids well.  Patient questioned on how to take dexamethasone, nurse provided instructions.  All questions answered, no further needs at this time.

## 2018-10-25 NOTE — Telephone Encounter (Signed)
Spoke to pt daughter, relate pt doing well, eating, no N/V. No questions or needs voiced at this time. Encourage to call with any concerns. Received verbal understanding.

## 2018-11-02 NOTE — Progress Notes (Signed)
[facility]  [phone]) [phone] [phone]) [phone]    ID: Gloria Mccoy DOB: 04/30/1946  MR#: [id number]  PJA#:[id number]  Patient Care Team: Redmond School, MD as PCP - General (Internal Medicine) Harl Bowie, Alphonse Guild, MD as PCP - Cardiology (Cardiology) Rainelle Sulewski, Virgie Dad, MD as Consulting Physician (Oncology) Kyung Rudd, MD as Consulting Physician (Radiation Oncology) Rutherford Guys, MD as Consulting Physician (Ophthalmology) Jovita Kussmaul, MD as Consulting Physician (General Surgery) Mauro Kaufmann, RN as Oncology Nurse Navigator Rockwell Germany, RN as Oncology Nurse Navigator OTHER MD:    CHIEF COMPLAINT: Triple negative breast cancer  CURRENT TREATMENT: Adjuvant chemotherapy    HISTORY OF CURRENT ILLNESS: From the original intake note:  Gloria Mccoy had routine screening mammography on 07/12/2018 showing a possible abnormality in the left breast. She underwent unilateral left diagnostic mammography with tomography and left breast ultrasonography at [facility] on 07/25/2018 showing: Breast Density Category B. There is a mass with indistinct margins in the lower inner posterior left breast measuring approximately 0.6 cm. Targeted ultrasound of the lower left breast was performed. There is a hypoechoic mass with margin irregularity in the left breast at 7 o'clock, 7 cm from the nipple measuring 0.7 x 0.5 x 0.6 cm. No lymphadenopathy seen in the left axilla.   Accordingly on 08/01/2018 she proceeded to biopsy of the left breast area in question. The pathology from this procedure showed ([id number]): invasive ductal carcinoma, grade II. Prognostic indicators significant for: estrogen receptor, 0% negative and progesterone receptor, 0% negative. Proliferation marker Ki67 at 40%. HER2 negative (1+) by immunohistochemistry.   The patient's subsequent history is as detailed below.   INTERVAL HISTORY: Gloria Mccoy was seen today for follow-up and treatment  of her triple negative breast cancer.   She continues on adjuvant chemotherapy consisting of cyclophosphamide, methotrexate, and fluorouracil (CMF), repeated every 21 days x 8. Today is day 11 cycle 1. She experienced one day of fatigue, which resolved. She had some constipation, which she treated with prune juice. She also had a mouth sore at the bottom of her right throat, which she rinsed with peroxide. She had no nausea and no vomiting.   Since her last visit here, she has not undergone any additional studies.     REVIEW OF SYSTEMS: Gloria Mccoy has been taking appropriate precautions against the spread of COVID-19; she does grocery shop, but she gets in and out as quickly as possible and only gets what she needs. The patient denies unusual headaches, visual changes, nausea, vomiting, or dizziness. There has been no unusual cough, phlegm production, or pleurisy. This been no change in bowel or bladder habits. The patient denies unexplained fatigue or unexplained weight loss, bleeding, rash, or fever. A detailed review of systems was otherwise noncontributory.    PAST MEDICAL HISTORY: Past Medical History:  Diagnosis Date   Arthritis    "hands sometimes" (07/13/2017)   Coronary artery disease    a. s/p CABG x3 in 06/2017 with LIMA-LAD, SVG-D1, and SVG-RI.  b. 10/2017: cath showing a widely patent LIMA-LAD with ostial occlusion of the SVG-RI and subtotally occluded atretic SVG-D1. Graft occlusion thought to be 2ry to improvement in pre-CABG stenoses.    Essential hypertension    Family history of adverse reaction to anesthesia    sister had a complication that was stated she had a "foggy" episode after her breast surgery was readmitted 1 day post op after being discharged from the hospital. Sister also has significant lung problems that could  have contributed to this    Family history of breast cancer    Family history of colon cancer    Hypothyroid    Meniere disease    Myocardial  infarction ([place]) 06/2017   during cardaic rehab   Obesity    Osteopenia 01/2012   T score -1.3 FRAX 7.9%/0.6%     PAST SURGICAL HISTORY: Past Surgical History:  Procedure Laterality Date   BREAST LUMPECTOMY WITH RADIOACTIVE SEED AND SENTINEL LYMPH NODE BIOPSY Left 09/01/2018   Procedure: LEFT BREAST LUMPECTOMY WITH RADIOACTIVE SEED AND SENTINEL LYMPH NODE BIOPSY;  Surgeon: Jovita Kussmaul, MD;  Location: [facility];  Service: General;  Laterality: Left;   CARDIAC CATHETERIZATION  07/13/2017   CATARACT EXTRACTION W/PHACO Right 01/28/2015   Procedure: CATARACT EXTRACTION PHACO AND INTRAOCULAR LENS PLACEMENT :  CDE:  5.70;  Surgeon: Rutherford Guys, MD;  Location[facility];  Service: Ophthalmology;  Laterality: Right;   CATARACT EXTRACTION W/PHACO Left 02/11/2015   Procedure: CATARACT EXTRACTION PHACO AND INTRAOCULAR LENS PLACEMENT (IOC);  Surgeon: Rutherford Guys, MD;  Location: [facility];  Service: Ophthalmology;  Laterality: Left;  CDE: 7.38   COLONOSCOPY N/A 09/26/2012   Procedure: COLONOSCOPY;  Surgeon: Jamesetta So, MD;  [facility];  Service: Gastroenterology;  Laterality: N/A;   CORONARY ARTERY BYPASS GRAFT N/A 07/15/2017   Procedure: CORONARY ARTERY BYPASS GRAFTING (CABG) X 3 USING LEFT INTERNAL MAMMARY ARTERY AND RIGHT SAPHENOUS VEIN- ENDOSCOPICALLY HARVESTED;  Surgeon: Melrose Nakayama, MD;  Location: [facility];  Service: Open Heart Surgery;  Laterality: N/A;   LEFT HEART CATH AND CORONARY ANGIOGRAPHY N/A 07/13/2017   Procedure: LEFT HEART CATH AND CORONARY ANGIOGRAPHY;  Surgeon: Martinique, Peter M, MD;  Location: [facility];  Service: Cardiovascular;  Laterality: N/A;   LEFT HEART CATH AND CORS/GRAFTS ANGIOGRAPHY N/A 11/03/2017   Procedure: LEFT HEART CATH AND CORS/GRAFTS ANGIOGRAPHY;  Surgeon: Troy Sine, MD;  Location: [facility];  Service: Cardiovascular;  Laterality: N/A;   TEE WITHOUT CARDIOVERSION N/A 07/15/2017   Procedure: TRANSESOPHAGEAL ECHOCARDIOGRAM  (TEE);  Surgeon: Melrose Nakayama, MD;  Location: [facility];  Service: Open Heart Surgery;  Laterality: N/A;   TUBAL LIGATION     TYMPANOPLASTY Left    fluid from ear drum     FAMILY HISTORY: Family History  Problem Relation Age of Onset   Diabetes Sister        AODM   Heart disease Sister 62       CABG   Breast cancer Sister    Heart disease Brother 73       In his 38s   Colon cancer Maternal Uncle 26   Diabetes Sister        AODM   Hypertension Daughter    Hypertension Daughter    Breast cancer Cousin        PATERNAL COUSIN   Heart attack Father        In his 18s   Stroke Mother    Stroke Maternal Grandmother    Diabetes Maternal Grandfather        d. 73   Heart attack Paternal Grandmother    Colon cancer Maternal Uncle 77   Breast cancer Cousin        dx in her 56s; mat first cousin   Breast cancer Cousin        dx 63s; d. 32s   Breast cancer Niece 58   Gloria Mccoy's father died from a myocardial infarction at age 56.  Patients' mother died from a stroke at age 97. The patient has 3 brothers and 9 sisters. One of Suan's sisters, [name], was diagnosed with breast cancer at the age of 37. Debhora also has a niece that was diagnosed with breast cancer at 84, and two cousins that were diagnosed with breast cancer.  She believes 1 of her brothers may have prostate cancer.  Patient denies anyone in her family having ovarian or pancreatic cancer. Mahdiya has an uncle that was diagnosed with colon cancer and an uncle that was diagnosed with "stomach" cancer.    GYNECOLOGIC HISTORY:  No LMP recorded. Patient is postmenopausal. 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 Her husband, Jason Fila, is a retired Administrator. They have two daughters. Their daughter, [name], lives in [place]  and is a housewife.  Their other daughter works at [employer]. Storm had one grandchild that is now deceased. She attends a [employer].    ADVANCED DIRECTIVES: Fonnie's husband, Jason Fila, is automatically her healthcare power of attorney.     HEALTH MAINTENANCE: Social History   Tobacco Use   Smoking status: Former Smoker   Smokeless tobacco: Former Systems developer    Types: Snuff    Quit date: 07/31/2017  Substance Use Topics   Alcohol use: Yes    Alcohol/week: 1.0 standard drinks    Types: 1 Standard drinks or equivalent per week    Comment: occasionally   Drug use: No    Colonoscopy: yes, Dr. Arnoldo Morale  PAP:   Bone density: yes, 2016, -1.1 osteopenic   Allergies  Allergen Reactions   Sulfa Antibiotics Shortness Of Breath   Sulfasalazine Shortness Of Breath    Current Outpatient Medications  Medication Sig Dispense Refill   acetaminophen (TYLENOL) 325 MG tablet Take 2 tablets (650 mg total) by mouth every 6 (six) hours as needed for mild pain.     aspirin EC 81 MG tablet Take 81 mg by mouth daily.     atorvastatin (LIPITOR) 80 MG tablet Take 1 tablet (80 mg total) by mouth daily at 6 PM. 90 tablet 3   Cholecalciferol (VITAMIN D PO) Take 1 tablet by mouth daily.      clopidogrel (PLAVIX) 75 MG tablet Take 1 tablet (75 mg total) by mouth daily. 90 tablet 3   Cyanocobalamin (VITAMIN B-12 PO) Take 1 tablet by mouth daily.     dexamethasone (DECADRON) 4 MG tablet Take 1 tablet (4 mg total) by mouth daily. Start the day after chemotherapy for 2 days. Take with food. 30 tablet 1   fish oil-omega-3 fatty acids 1000 MG capsule Take 1 g by mouth daily.     furosemide (LASIX) 40 MG tablet Take 1 tablet (40 mg total) by mouth daily. 30 tablet 1   HYDROcodone-acetaminophen (NORCO/VICODIN) 5-325 MG tablet Take 1-2 tablets by mouth every 6 (six) hours as needed for moderate pain or severe pain. 10 tablet 0   levothyroxine (SYNTHROID, LEVOTHROID) 75 MCG tablet Take 75 mcg  by mouth daily.     losartan (COZAAR) 25 MG tablet Take 0.5 tablets (12.5 mg total) by mouth daily. 45 tablet 3   metoprolol tartrate (LOPRESSOR) 25 MG tablet Take 1 tablet (25 mg total) by mouth 2 (two) times daily. 180 tablet 3   prochlorperazine (COMPAZINE) 10 MG tablet Take  1 tablet (10 mg total) by mouth 3 (three) times daily before meals. 30 tablet 1   VITAMIN E PO Take 1 tablet by mouth daily.      No current facility-administered medications for this visit.      OBJECTIVE: Middle-aged African-American woman in no acute distress Vitals:   11/03/18 1018  BP: (!) 117/52  Pulse: 63  Resp: 18  Temp: 97.9 F (36.6 C)  SpO2: 97%     Body mass index is 36.07 kg/m.   Wt Readings from Last 3 Encounters:  11/03/18 203 lb 9.6 oz (92.4 kg)  10/24/18 208 lb 8 oz (94.6 kg)  10/20/18 208 lb 3.2 oz (94.4 kg)      ECOG FS:1 - Symptomatic but completely ambulatory  Sclerae unicteric, EOMs intact Masked No cervical or supraclavicular adenopathy Lungs no rales or rhonchi Heart regular rate and rhythm Abd soft, nontender, positive bowel sounds MSK no focal spinal tenderness, no upper extremity lymphedema Neuro: nonfocal, well oriented, appropriate affect Breasts: Deferred   LAB RESULTS:  CMP     Component Value Date/Time   NA 142 10/24/2018 0833   K 3.6 10/24/2018 0833   CL 106 10/24/2018 0833   CO2 27 10/24/2018 0833   GLUCOSE 129 (H) 10/24/2018 0833   BUN 12 10/24/2018 0833   CREATININE 0.91 10/24/2018 0833   CALCIUM 9.0 10/24/2018 0833   PROT 7.4 10/24/2018 0833   ALBUMIN 3.5 10/24/2018 0833   AST 18 10/24/2018 0833   ALT 18 10/24/2018 0833   ALKPHOS 74 10/24/2018 0833   BILITOT 0.9 10/24/2018 0833   GFRNONAA >60 10/24/2018 0833   GFRAA >60 10/24/2018 0833    No results found for: TOTALPROTELP, ALBUMINELP, A1GS, A2GS, BETS, BETA2SER, GAMS, MSPIKE, SPEI  No results found for: KPAFRELGTCHN, LAMBDASER, KAPLAMBRATIO  Lab Results  Component Value Date   WBC  8.3 11/03/2018   NEUTROABS 4.8 11/03/2018   HGB 12.9 11/03/2018   HCT 36.6 11/03/2018   MCV 83.8 11/03/2018   PLT 236 11/03/2018    '@LASTCHEMISTRY' @  No results found for: LABCA2  No components found for: MVEHMC947  No results for input(s): INR in the last 168 hours.  No results found for: LABCA2  No results found for: SJG283  No results found for: MOQ947  No results found for: MLY650  No results found for: CA2729  No components found for: HGQUANT  No results found for: CEA1 / No results found for: CEA1   No results found for: AFPTUMOR  No results found for: CHROMOGRNA  No results found for: PSA1  Appointment on 11/03/2018  Component Date Value Ref Range Status   WBC 11/03/2018 8.3  4.0 - 10.5 K/uL Final   RBC 11/03/2018 4.37  3.87 - 5.11 MIL/uL Final   Hemoglobin 11/03/2018 12.9  12.0 - 15.0 g/dL Final   HCT 11/03/2018 36.6  36.0 - 46.0 % Final   MCV 11/03/2018 83.8  80.0 - 100.0 fL Final   MCH 11/03/2018 29.5  26.0 - 34.0 pg Final   MCHC 11/03/2018 35.2  30.0 - 36.0 g/dL Final   RDW 11/03/2018 13.7  11.5 - 15.5 % Final   Platelets 11/03/2018 236  150 - 400 K/uL Final   nRBC 11/03/2018 0.0  0.0 - 0.2 % Final   Neutrophils Relative % 11/03/2018 57  % Final   Neutro Abs 11/03/2018 4.8  1.7 - 7.7 K/uL Final   Lymphocytes Relative 11/03/2018 32  % Final   Lymphs Abs 11/03/2018 2.7  0.7 -  4.0 K/uL Final   Monocytes Relative 11/03/2018 7  % Final   Monocytes Absolute 11/03/2018 0.6  0.1 - 1.0 K/uL Final   Eosinophils Relative 11/03/2018 3  % Final   Eosinophils Absolute 11/03/2018 0.2  0.0 - 0.5 K/uL Final   Basophils Relative 11/03/2018 1  % Final   Basophils Absolute 11/03/2018 0.1  0.0 - 0.1 K/uL Final   Immature Granulocytes 11/03/2018 0  % Final   Abs Immature Granulocytes 11/03/2018 0.02  0.00 - 0.07 K/uL Final   Performed at [facility], [address] [place], [place] [address]    (this displays the last  labs from the last 3 days)  No results found for: TOTALPROTELP, ALBUMINELP, A1GS, A2GS, BETS, BETA2SER, GAMS, MSPIKE, SPEI (this displays SPEP labs)  No results found for: KPAFRELGTCHN, LAMBDASER, KAPLAMBRATIO (kappa/lambda light chains)  No results found for: HGBA, HGBA2QUANT, HGBFQUANT, HGBSQUAN (Hemoglobinopathy evaluation)   No results found for: LDH  No results found for: IRON, TIBC, IRONPCTSAT (Iron and TIBC)  No results found for: FERRITIN  Urinalysis    Component Value Date/Time   COLORURINE YELLOW 07/14/2017 2015   APPEARANCEUR HAZY (A) 07/14/2017 2015   LABSPEC 1.011 07/14/2017 2015   PHURINE 6.0 07/14/2017 2015   [place] 07/14/2017 2015   [place] 07/14/2017 2015   [place] 07/14/2017 2015   [place] 07/14/2017 2015   PROTEINUR NEGATIVE 07/14/2017 2015   UROBILINOGEN 0.2 07/03/2014 0852   NITRITE NEGATIVE 07/14/2017 2015   LEUKOCYTESUR LARGE (A) 07/14/2017 2015     STUDIES:  No results found.   ELIGIBLE FOR AVAILABLE RESEARCH PROTOCOL: no   ASSESSMENT: 73 y.o. [place], [place] woman status post left breast lower inner quadrant biopsy 08/01/2018 for a clinical T1c N0, stage IB invasive ductal carcinoma, grade 2, triple negative, with an MIB-1 of 40%  (1) genetics testing 08/29/2018 through with Hereditary Gene Panel offered by [facility] found no deleterious mutations in APC, ATM, AXIN2, BARD1, BMPR1A, BRCA1, BRCA2, BRIP1, CDH1, CDK4, CDKN2A (p14ARF), CDKN2A (p16INK4a), CHEK2, CTNNA1, DICER1, EPCAM (Deletion/duplication testing only), GREM1 (promoter region deletion/duplication testing only), KIT, MEN1, MLH1, MSH2, MSH3, MSH6, MUTYH, NBN, NF1, NHTL1, PALB2, PDGFRA, PMS2, POLD1, POLE, PTEN, RAD50, RAD51C, RAD51D, SDHB, SDHC, SDHD, SMAD4, SMARCA4. STK11, TP53, TSC1, TSC2, and VHL.  The following genes were evaluated for sequence changes only: SDHA and HOXB13 c.251G>A variant only.   (a) MLH1 c.1890T>G VUS identified  (2) status  post left lumpectomy with sentinel lymph node sampling 09/01/2022 a pT1c pN0, stage IB invasive ductal carcinoma, grade 2, with negative margins.  (a) a total of 3 sentinel lymph nodes were removed  (3) chemotherapy consisting of cyclophosphamide, methotrexate, and fluorouracil (CMF), repeated every 21 days x 8, starting 10/24/2018  (4) adjuvant radiation to be given concurrently with cycles 3-4-5 of chemotherapy   PLAN: Rocky did remarkably well with her first cycle of CMF and her counts today are quite good.  We will not need to add on pro or Neulasta and she will not need intercurrent antibiotics.  She is feeling overwhelmed by the financial situation.  She will be meeting with our financial advocate today.  We can set her up to start her adjuvant radiation with cycle #3 if that would work out for the radiation group.  Ideally she would meet with Dr. Lisbeth Renshaw on her next treatment day which will be 11/14/2018  She knows to call for any other issue that may develop before that visit.   Tarl Cephas, Virgie Dad,  MD  11/03/18 10:42 AM Medical Oncology and Hematology [facility] [address] [place], [place] [address] Tel. [phone]    Fax. [phone]   I, Jacqualyn Posey am acting as a Education administrator for Chauncey Cruel, MD.   I, Lurline Del MD, have reviewed the above documentation for accuracy and completeness, and I agree with the above.

## 2018-11-03 ENCOUNTER — Other Ambulatory Visit: Payer: Self-pay

## 2018-11-03 ENCOUNTER — Inpatient Hospital Stay (HOSPITAL_BASED_OUTPATIENT_CLINIC_OR_DEPARTMENT_OTHER): Payer: Medicare HMO | Admitting: Oncology

## 2018-11-03 ENCOUNTER — Inpatient Hospital Stay: Payer: Medicare HMO

## 2018-11-03 VITALS — BP 117/52 | HR 63 | Temp 97.9°F | Resp 18 | Ht 63.0 in | Wt 203.6 lb

## 2018-11-03 DIAGNOSIS — Z79899 Other long term (current) drug therapy: Secondary | ICD-10-CM | POA: Diagnosis not present

## 2018-11-03 DIAGNOSIS — Z8249 Family history of ischemic heart disease and other diseases of the circulatory system: Secondary | ICD-10-CM

## 2018-11-03 DIAGNOSIS — K5903 Drug induced constipation: Secondary | ICD-10-CM | POA: Diagnosis not present

## 2018-11-03 DIAGNOSIS — C50512 Malignant neoplasm of lower-outer quadrant of left female breast: Secondary | ICD-10-CM | POA: Diagnosis not present

## 2018-11-03 DIAGNOSIS — I10 Essential (primary) hypertension: Secondary | ICD-10-CM | POA: Diagnosis not present

## 2018-11-03 DIAGNOSIS — C50312 Malignant neoplasm of lower-inner quadrant of left female breast: Secondary | ICD-10-CM | POA: Diagnosis not present

## 2018-11-03 DIAGNOSIS — Z171 Estrogen receptor negative status [ER-]: Secondary | ICD-10-CM | POA: Diagnosis not present

## 2018-11-03 DIAGNOSIS — Z87891 Personal history of nicotine dependence: Secondary | ICD-10-CM | POA: Diagnosis not present

## 2018-11-03 DIAGNOSIS — Z5111 Encounter for antineoplastic chemotherapy: Secondary | ICD-10-CM | POA: Diagnosis not present

## 2018-11-03 DIAGNOSIS — Z7982 Long term (current) use of aspirin: Secondary | ICD-10-CM | POA: Diagnosis not present

## 2018-11-03 DIAGNOSIS — Z803 Family history of malignant neoplasm of breast: Secondary | ICD-10-CM | POA: Diagnosis not present

## 2018-11-03 LAB — COMPREHENSIVE METABOLIC PANEL
ALT: 17 U/L (ref 0–44)
AST: 18 U/L (ref 15–41)
Albumin: 3.4 g/dL — ABNORMAL LOW (ref 3.5–5.0)
Alkaline Phosphatase: 78 U/L (ref 38–126)
Anion gap: 8 (ref 5–15)
BUN: 9 mg/dL (ref 8–23)
CO2: 29 mmol/L (ref 22–32)
Calcium: 8.8 mg/dL — ABNORMAL LOW (ref 8.9–10.3)
Chloride: 104 mmol/L (ref 98–111)
Creatinine, Ser: 0.94 mg/dL (ref 0.44–1.00)
GFR calc Af Amer: >60 mL/min (ref >60)
GFR calc non Af Amer: >60 mL/min (ref >60)
Glucose, Bld: 103 mg/dL — ABNORMAL HIGH (ref 70–99)
Potassium: 3.4 mmol/L — ABNORMAL LOW (ref 3.5–5.1)
Sodium: 141 mmol/L (ref 135–145)
Total Bilirubin: 1.1 mg/dL (ref 0.3–1.2)
Total Protein: 7.3 g/dL (ref 6.5–8.1)

## 2018-11-03 LAB — CBC WITH DIFFERENTIAL/PLATELET
Abs Immature Granulocytes: 0.02 10*3/uL (ref 0.00–0.07)
Basophils Absolute: 0.1 10*3/uL (ref 0.0–0.1)
Basophils Relative: 1 %
Eosinophils Absolute: 0.2 10*3/uL (ref 0.0–0.5)
Eosinophils Relative: 3 %
HCT: 36.6 % (ref 36.0–46.0)
Hemoglobin: 12.9 g/dL (ref 12.0–15.0)
Immature Granulocytes: 0 %
Lymphocytes Relative: 32 %
Lymphs Abs: 2.7 10*3/uL (ref 0.7–4.0)
MCH: 29.5 pg (ref 26.0–34.0)
MCHC: 35.2 g/dL (ref 30.0–36.0)
MCV: 83.8 fL (ref 80.0–100.0)
Monocytes Absolute: 0.6 10*3/uL (ref 0.1–1.0)
Monocytes Relative: 7 %
Neutro Abs: 4.8 10*3/uL (ref 1.7–7.7)
Neutrophils Relative %: 57 %
Platelets: 236 10*3/uL (ref 150–400)
RBC: 4.37 MIL/uL (ref 3.87–5.11)
RDW: 13.7 % (ref 11.5–15.5)
WBC: 8.3 10*3/uL (ref 4.0–10.5)
nRBC: 0 % (ref 0.0–0.2)

## 2018-11-06 ENCOUNTER — Encounter: Payer: Self-pay | Admitting: Oncology

## 2018-11-06 ENCOUNTER — Telehealth: Payer: Self-pay | Admitting: Oncology

## 2018-11-06 ENCOUNTER — Telehealth: Payer: Self-pay

## 2018-11-06 NOTE — Telephone Encounter (Signed)
-----   Message from Gardenia Phlegm, NP sent at 11/06/2018  9:53 AM EDT ----- Patient potassium is slightly low, recommend increase potassium rich diet ----- Message ----- From: Buel Ream, Lab In [place] Sent: 11/03/2018  10:19 AM EDT To: Chauncey Cruel, MD

## 2018-11-06 NOTE — Progress Notes (Signed)
Met w/ pt on 11/03/18 to introduce myself as her Arboriculturist and to discuss copay assistance and the [employer].  Pt would like to apply so she gave me consent to apply in her behalf.  I applied to thePatient Advocate Foundationand she was approvedfor $16,000. The standard award is $2,500 and the additional $13,500 is accessible on a first-come, first- served basis as long as funding remains available in the [place] with a 6 month look back periodfrom 11/03/18.  Pt would like to apply for the [employer] so she will bring proof of income on 11/14/18.  If approved I will give her an expense sheet and my card for anyquestions or concernsshe may havein the future.

## 2018-11-06 NOTE — Telephone Encounter (Signed)
Tried to reach regarding schedule °

## 2018-11-06 NOTE — Telephone Encounter (Signed)
LVM for patient that potassium slightly low and per NP recommendations increase potassium in diet.  Center number left for call back with questions.

## 2018-11-14 ENCOUNTER — Other Ambulatory Visit: Payer: Self-pay | Admitting: Oncology

## 2018-11-14 ENCOUNTER — Inpatient Hospital Stay: Payer: Medicare HMO

## 2018-11-14 ENCOUNTER — Inpatient Hospital Stay (HOSPITAL_BASED_OUTPATIENT_CLINIC_OR_DEPARTMENT_OTHER): Payer: Medicare HMO | Admitting: Medical

## 2018-11-14 ENCOUNTER — Other Ambulatory Visit: Payer: Self-pay

## 2018-11-14 VITALS — BP 139/65 | HR 76 | Temp 97.8°F | Resp 18 | Wt 208.0 lb

## 2018-11-14 DIAGNOSIS — C50512 Malignant neoplasm of lower-outer quadrant of left female breast: Secondary | ICD-10-CM

## 2018-11-14 DIAGNOSIS — Z8249 Family history of ischemic heart disease and other diseases of the circulatory system: Secondary | ICD-10-CM | POA: Diagnosis not present

## 2018-11-14 DIAGNOSIS — T148XXA Other injury of unspecified body region, initial encounter: Secondary | ICD-10-CM

## 2018-11-14 DIAGNOSIS — Z5111 Encounter for antineoplastic chemotherapy: Secondary | ICD-10-CM | POA: Diagnosis not present

## 2018-11-14 DIAGNOSIS — Z87891 Personal history of nicotine dependence: Secondary | ICD-10-CM | POA: Diagnosis not present

## 2018-11-14 DIAGNOSIS — Z7982 Long term (current) use of aspirin: Secondary | ICD-10-CM | POA: Diagnosis not present

## 2018-11-14 DIAGNOSIS — I10 Essential (primary) hypertension: Secondary | ICD-10-CM | POA: Diagnosis not present

## 2018-11-14 DIAGNOSIS — K5903 Drug induced constipation: Secondary | ICD-10-CM | POA: Diagnosis not present

## 2018-11-14 DIAGNOSIS — C50312 Malignant neoplasm of lower-inner quadrant of left female breast: Secondary | ICD-10-CM | POA: Diagnosis not present

## 2018-11-14 DIAGNOSIS — Z171 Estrogen receptor negative status [ER-]: Secondary | ICD-10-CM | POA: Diagnosis not present

## 2018-11-14 DIAGNOSIS — Z79899 Other long term (current) drug therapy: Secondary | ICD-10-CM | POA: Diagnosis not present

## 2018-11-14 LAB — CBC WITH DIFFERENTIAL/PLATELET
Abs Immature Granulocytes: 0.26 10*3/uL — ABNORMAL HIGH (ref 0.00–0.07)
Basophils Absolute: 0.1 10*3/uL (ref 0.0–0.1)
Basophils Relative: 1 %
Eosinophils Absolute: 0.2 10*3/uL (ref 0.0–0.5)
Eosinophils Relative: 2 %
HCT: 37.5 % (ref 36.0–46.0)
Hemoglobin: 13.5 g/dL (ref 12.0–15.0)
Immature Granulocytes: 2 %
Lymphocytes Relative: 32 %
Lymphs Abs: 3.9 10*3/uL (ref 0.7–4.0)
MCH: 30.1 pg (ref 26.0–34.0)
MCHC: 36 g/dL (ref 30.0–36.0)
MCV: 83.7 fL (ref 80.0–100.0)
Monocytes Absolute: 1.6 10*3/uL — ABNORMAL HIGH (ref 0.1–1.0)
Monocytes Relative: 13 %
Neutro Abs: 6.1 10*3/uL (ref 1.7–7.7)
Neutrophils Relative %: 50 %
Platelets: 299 10*3/uL (ref 150–400)
RBC: 4.48 MIL/uL (ref 3.87–5.11)
RDW: 14 % (ref 11.5–15.5)
WBC: 12.2 10*3/uL — ABNORMAL HIGH (ref 4.0–10.5)
nRBC: 0 % (ref 0.0–0.2)

## 2018-11-14 LAB — COMPREHENSIVE METABOLIC PANEL
ALT: 17 U/L (ref 0–44)
AST: 17 U/L (ref 15–41)
Albumin: 3.5 g/dL (ref 3.5–5.0)
Alkaline Phosphatase: 80 U/L (ref 38–126)
Anion gap: 9 (ref 5–15)
BUN: 17 mg/dL (ref 8–23)
CO2: 26 mmol/L (ref 22–32)
Calcium: 9 mg/dL (ref 8.9–10.3)
Chloride: 105 mmol/L (ref 98–111)
Creatinine, Ser: 1.04 mg/dL — ABNORMAL HIGH (ref 0.44–1.00)
GFR calc Af Amer: >60 mL/min (ref >60)
GFR calc non Af Amer: 53 mL/min — ABNORMAL LOW (ref >60)
Glucose, Bld: 110 mg/dL — ABNORMAL HIGH (ref 70–99)
Potassium: 4.2 mmol/L (ref 3.5–5.1)
Sodium: 140 mmol/L (ref 135–145)
Total Bilirubin: 0.4 mg/dL (ref 0.3–1.2)
Total Protein: 7.6 g/dL (ref 6.5–8.1)

## 2018-11-14 MED ORDER — FLUOROURACIL CHEMO INJECTION 2.5 GM/50ML
600.0000 mg/m2 | Freq: Once | INTRAVENOUS | Status: AC
  Administered 2018-11-14: 16:00:00 1200 mg via INTRAVENOUS
  Filled 2018-11-14: qty 24

## 2018-11-14 MED ORDER — DEXAMETHASONE SODIUM PHOSPHATE 10 MG/ML IJ SOLN
INTRAMUSCULAR | Status: AC
  Filled 2018-11-14: qty 1

## 2018-11-14 MED ORDER — PALONOSETRON HCL INJECTION 0.25 MG/5ML
0.2500 mg | Freq: Once | INTRAVENOUS | Status: AC
  Administered 2018-11-14: 15:00:00 0.25 mg via INTRAVENOUS

## 2018-11-14 MED ORDER — SODIUM CHLORIDE 0.9 % IV SOLN
Freq: Once | INTRAVENOUS | Status: AC
  Administered 2018-11-14: 15:00:00 via INTRAVENOUS
  Filled 2018-11-14: qty 250

## 2018-11-14 MED ORDER — METHOTREXATE SODIUM (PF) CHEMO INJECTION 250 MG/10ML
39.8000 mg/m2 | Freq: Once | INTRAMUSCULAR | Status: AC
  Administered 2018-11-14: 16:00:00 80 mg via INTRAVENOUS
  Filled 2018-11-14: qty 3.2

## 2018-11-14 MED ORDER — PALONOSETRON HCL INJECTION 0.25 MG/5ML
INTRAVENOUS | Status: AC
  Filled 2018-11-14: qty 5

## 2018-11-14 MED ORDER — SODIUM CHLORIDE 0.9 % IV SOLN
600.0000 mg/m2 | Freq: Once | INTRAVENOUS | Status: AC
  Administered 2018-11-14: 16:00:00 1200 mg via INTRAVENOUS
  Filled 2018-11-14: qty 60

## 2018-11-14 MED ORDER — DEXAMETHASONE SODIUM PHOSPHATE 10 MG/ML IJ SOLN
10.0000 mg | Freq: Once | INTRAMUSCULAR | Status: AC
  Administered 2018-11-14: 10 mg via INTRAVENOUS

## 2018-11-14 NOTE — Progress Notes (Signed)
Pt developed large circular bump where labs were drawn. Measures approximately 1 inch in diameter. NS stopped. Sandi Mealy, PA notified. Per Sandi Mealy, PA ok to continue with treatment plan. Circular bump is most likely bleeding from where labs were drawn. Ice pack placed on area. Pt denies pain or discomfort at site. Will continue to follow.

## 2018-11-14 NOTE — Patient Instructions (Addendum)
[facility] Discharge Instructions for Patients Receiving Chemotherapy  Today you received the following chemotherapy agents Cytoxan, methotrexate and Adrucil (5FU).   To help prevent nausea and vomiting after your treatment, we encourage you to take your nausea medication as directed BUT NO ZOFRAN FOR 3 DAYS AFTER CHEMO. If you develop nausea and vomiting that is not controlled by your nausea medication, call the clinic.   BELOW ARE SYMPTOMS THAT SHOULD BE REPORTED IMMEDIATELY:  *FEVER GREATER THAN 100.5 F  *CHILLS WITH OR WITHOUT FEVER  NAUSEA AND VOMITING THAT IS NOT CONTROLLED WITH YOUR NAUSEA MEDICATION  *UNUSUAL SHORTNESS OF BREATH  *UNUSUAL BRUISING OR BLEEDING  TENDERNESS IN MOUTH AND THROAT WITH OR WITHOUT PRESENCE OF ULCERS  *URINARY PROBLEMS  *BOWEL PROBLEMS  UNUSUAL RASH Items with * indicate a potential emergency and should be followed up as soon as possible.  Feel free to call the clinic you have any questions or concerns. The clinic phone number is [phone].  Please show the [facility] at check-in to the Emergency Department and triage nurse.    Hematoma A hematoma is a collection of blood. A hematoma can happen:  Under the skin.  In an organ.  In a body space.  In a joint space.  In other tissues. The blood can thicken (clot) to form a lump that you can see and feel. The lump is often hard and may become sore and tender. The lump can be very small or very big. Most hematomas get better in a few days to weeks. However, some hematomas may be serious and need medical care. What are the causes? This condition is caused by:  An injury.  Blood that leaks under the skin.  Problems from surgeries.  Medical conditions that cause bleeding or bruising. What increases the risk? You are more likely to develop this condition if:  You are an older adult.  You use medicines that thin your blood. What are the signs or  symptoms? Symptoms depend on where the hematoma is in your body.  If the hematoma is under the skin, there is: ? A firm lump on the body. ? Pain and tenderness in the area. ? Bruising. The skin above the lump may be blue, dark blue, purple-red, or yellowish.  If the hematoma is deep in the tissues or body spaces, there may be: ? Blood in the stomach. This may cause pain in the belly (abdomen), weakness, passing out (fainting), and shortness of breath. ? Blood in the head. This may cause a headache, weakness, trouble speaking or understanding speech, or passing out. How is this diagnosed? This condition is diagnosed based on:  Your medical history.  A physical exam.  Imaging tests, such as ultrasound or CT scan.  Blood tests. How is this treated? Treatment depends on the cause, size, and location of the hematoma. Treatment may include:  Doing nothing. Many hematomas go away on their own without treatment.  Surgery or close monitoring. This may be needed for large hematomas or hematomas that affect the body's organs.  Medicines. These may be given if a medical condition caused the hematoma. Follow these instructions at home: Managing pain, stiffness, and swelling   If told, put ice on the area. ? Put ice in a plastic bag. ? Place a towel between your skin and the bag. ? Leave the ice on for 20 minutes, 2-3 times a day for the first two days.  If told, put heat on the affected  area after putting ice on the area for two days. Use the heat source that your doctor tells you to use. This could be a moist heat pack or a heating pad. To do this: ? Place a towel between your skin and the heat source. ? Leave the heat on for 20-30 minutes. ? Remove the heat if your skin turns bright red. This is very important if you are unable to feel pain, heat, or cold. You may have a greater risk of getting burned.  Raise (elevate) the affected area above the level of your heart while you are  sitting or lying down.  Wrap the affected area with an elastic bandage, if told by your doctor. Do not wrap the bandage too tightly.  If your hematoma is on a leg or foot and is painful, your doctor may give you crutches. Use them as told by your doctor. General instructions  Take over-the-counter and prescription medicines only as told by your doctor.  Keep all follow-up visits as told by your doctor. This is important. Contact a doctor if:  You have a fever.  The swelling or bruising gets worse.  You start to get more hematomas. Get help right away if:  Your pain gets worse.  Your pain is not getting better with medicine.  Your skin over the hematoma breaks or starts to bleed.  Your hematoma is in your chest or belly and you: ? Pass out. ? Feel weak. ? Become short of breath.  You have a hematoma on your scalp that is caused by a fall or injury, and you: ? Have a headache that gets worse. ? Have trouble speaking or understanding speech. ? Become less alert or you pass out. Summary  A hematoma is a collection of blood in any part of your body.  Most hematomas get better on their own in a few days to weeks. Some may need medical care.  Follow instructions from your doctor about how to care for your hematoma.  Contact a doctor if the swelling or bruising gets worse, or if you are short of breath. This information is not intended to replace advice given to you by your health care provider. Make sure you discuss any questions you have with your health care provider. Document Released: 07/15/2004 Document Revised: 11/10/2017 Document Reviewed: 11/10/2017 Elsevier Interactive Patient Education  2019 [employer].

## 2018-11-14 NOTE — Progress Notes (Signed)
Negative the patient was seen in the infusion room today as she was receiving chemotherapy.  It was noted that she had a large mass in her right dorsal forearm at the site of a phlebotomy.  The area was lateral and proximal to her IV site where she would be receiving chemotherapy. Today.     A cold pack was placed on the area.  The patient was instructed to use a cold pack to the area for 20 minutes each time 4 times daily.  The area was evaluated by the assistant lab supervisor today.  She reassured the patient that the area would gradually begin to resolve on its own but would likely cause a large bruise at that site.  Sandi Mealy, MHS, PA-C Physician Assistant

## 2018-11-15 ENCOUNTER — Other Ambulatory Visit: Payer: Self-pay | Admitting: Medical

## 2018-11-15 ENCOUNTER — Telehealth: Payer: Self-pay | Admitting: Radiation Oncology

## 2018-11-15 NOTE — Progress Notes (Signed)
Location of Breast Cancer:LIQ of left breast  Did patient present with symptoms (if so, please note symptoms) or was this found on screening mammography?: Screening mammography on 07/12/2018 showing a possible abnormality in the left breast. She underwent unilateral left diagnostic mammography with tomography and left breast ultrasonography at [facility] on 07/25/2018 showing: Breast Density Category B. There is a mass with indistinct margins in the lower inner posterior left breast measuring approximately 0.6 cm. Targeted ultrasound of the lower left breast was performed. There is a hypoechoic mass with margin irregularity in the left breast at 7 o'clock, 7 cm from the nipple measuring 0.7 x 0.5 x 0.6 cm. No lymphadenopathy seen in the left axilla.   Histology per Pathology Report: Left Lumpectomy 09/01/2018   Receptor Status: ER(-), PR (-), Her2-neu (-), Ki-67(40%)    Past/Anticipated interventions by surgeon, if any:  status post left lumpectomy with sentinel lymph node sampling 09/01/2022 a pT1c pN0, stage IB invasive ductal carcinoma, grade 2, with negative margins.             (a) a total of 3 sentinel lymph nodes were removed  Past/Anticipated interventions by medical oncology, if any: Chemotherapy  Dr. Jana Hakim 11/03/2018 -chemotherapy consisting of cyclophosphamide, methotrexate, and fluorouracil (CMF), repeated every 21 days x 8, starting 10/24/2018 -adjuvant radiation to be given concurrently with cycles 3-4-5 of chemotherapy  Lymphedema issues, if any:    Pain issues, if any:    SAFETY ISSUES:  Prior radiation? no  Pacemaker/ICD? no  Possible current pregnancy? No   Is the patient on methotrexate? no  Current Complaints / other details:   -Genetic Testing- no deleterious mutations    Cori Razor, RN 11/15/2018,1:52 PM

## 2018-11-15 NOTE — Telephone Encounter (Signed)
Left voicemail to confirm appt and verify information.

## 2018-11-16 ENCOUNTER — Encounter: Payer: Self-pay | Admitting: Radiation Oncology

## 2018-11-16 ENCOUNTER — Other Ambulatory Visit: Payer: Self-pay

## 2018-11-16 ENCOUNTER — Other Ambulatory Visit: Payer: Self-pay | Admitting: Oncology

## 2018-11-16 ENCOUNTER — Ambulatory Visit
Admission: RE | Admit: 2018-11-16 | Discharge: 2018-11-16 | Disposition: A | Discharge status: Home / Self Care | Payer: Medicare HMO | Source: Ambulatory Visit | Attending: Radiation Oncology | Admitting: Radiation Oncology

## 2018-11-16 VITALS — Ht 63.0 in | Wt 200.0 lb

## 2018-11-16 DIAGNOSIS — C50512 Malignant neoplasm of lower-outer quadrant of left female breast: Secondary | ICD-10-CM

## 2018-11-16 DIAGNOSIS — Z171 Estrogen receptor negative status [ER-]: Secondary | ICD-10-CM | POA: Diagnosis not present

## 2018-11-16 DIAGNOSIS — Z803 Family history of malignant neoplasm of breast: Secondary | ICD-10-CM | POA: Diagnosis not present

## 2018-11-16 DIAGNOSIS — Z9889 Other specified postprocedural states: Secondary | ICD-10-CM | POA: Diagnosis not present

## 2018-11-16 NOTE — Progress Notes (Signed)
Radiation Oncology         [phone] ________________________________  Outpatient Consultation - Conducted via telephone due to current COVID-19 concerns for limiting patient exposure  I spoke with the patient to conduct this consult visit via telephone to spare the patient unnecessary potential exposure in the healthcare setting during the current COVID-19 pandemic. The patient was notified in advance and was offered a [place] meeting to allow for face to face communication but unfortunately reported that they did not have the appropriate resources/technology to support such a visit and instead preferred to proceed with a telephone consult.   ________________________________  Name: Gloria Mccoy        MRN: [id number]  Date of Service: 11/16/2018 DOB: Aug 03, 1945  CC:Redmond School, MD  Magrinat, Virgie Dad, MD     REFERRING PHYSICIAN: Magrinat, Virgie Dad, MD   DIAGNOSIS: The encounter diagnosis was Malignant neoplasm of lower-outer quadrant of left breast of female, estrogen receptor negative ([place]).   HISTORY OF PRESENT ILLNESS: Gloria Mccoy is a 73 y.o. female originally seen in February 2020 for a new diagnosis of left breast cancer. The patient was noted to have a possible mass in the posterior inner aspect of the left breast on screening mammogram. She underwent diagnostic imaging which revealed a 7 x 5 x 6 mm mass at 7:00, and her axilla was negative for adenopathy. She underwent a biopsy of the mass on 08/01/2018 which revealed a triple negative, grade 2 invasive ductal carcinoma with a Ki 67 of 40%. She underwent lumpectomy with sentinel node biopsy on 09/01/2018 revealing a grade 2, 1.1 cm invasive ductal carcinoma with associated intermediate grade DICS. Her margins were negative, with the closest being <75mm to the posterior margin. Her three sampled nodes were negative for disease.  She was counseled on the rationale for adjuvant chemotherapy by Dr. Jana Hakim, and began CMF  chemotherapy on 10/24/2018.  Dr. Virgie Dad plan is for her to receive concurrent chemoradiation for cycles 3 4 and 5 she is contacted by phone today discuss these recommendations.    PREVIOUS RADIATION THERAPY: No   PAST MEDICAL HISTORY:  Past Medical History:  Diagnosis Date  . Arthritis    "hands sometimes" (07/13/2017)  . Coronary artery disease    a. s/p CABG x3 in 06/2017 with LIMA-LAD, SVG-D1, and SVG-RI.  b. 10/2017: cath showing a widely patent LIMA-LAD with ostial occlusion of the SVG-RI and subtotally occluded atretic SVG-D1. Graft occlusion thought to be 2ry to improvement in pre-CABG stenoses.   . Essential hypertension   . Family history of adverse reaction to anesthesia    sister had a complication that was stated she had a "foggy" episode after her breast surgery was readmitted 1 day post op after being discharged from the hospital. Sister also has significant lung problems that could have contributed to this   . Family history of breast cancer   . Family history of colon cancer   . Hypothyroid   . Meniere disease   . Myocardial infarction ([place]) 06/2017   during cardaic rehab  . Obesity   . Osteopenia 01/2012   T score -1.3 FRAX 7.9%/0.6%       PAST SURGICAL HISTORY: Past Surgical History:  Procedure Laterality Date  . BREAST LUMPECTOMY WITH RADIOACTIVE SEED AND SENTINEL LYMPH NODE BIOPSY Left 09/01/2018   Procedure: LEFT BREAST LUMPECTOMY WITH RADIOACTIVE SEED AND SENTINEL LYMPH NODE BIOPSY;  Surgeon: Jovita Kussmaul, MD;  Location: [facility] [place];  Service: General;  Laterality: Left;  .  CARDIAC CATHETERIZATION  07/13/2017  . CATARACT EXTRACTION W/PHACO Right 01/28/2015   Procedure: CATARACT EXTRACTION PHACO AND INTRAOCULAR LENS PLACEMENT :  CDE:  5.70;  Surgeon: Rutherford Guys, MD;  Location: [facility];  Service: Ophthalmology;  Laterality: Right;  . CATARACT EXTRACTION W/PHACO Left 02/11/2015   Procedure: CATARACT EXTRACTION PHACO AND INTRAOCULAR LENS PLACEMENT (IOC);  Surgeon:  Rutherford Guys, MD;  Location: [facility];  Service: Ophthalmology;  Laterality: Left;  CDE: 7.38  . COLONOSCOPY N/A 09/26/2012   Procedure: COLONOSCOPY;  Surgeon: Jamesetta So, MD;  [facility];  Service: Gastroenterology;  Laterality: N/A;  . CORONARY ARTERY BYPASS GRAFT N/A 07/15/2017   Procedure: CORONARY ARTERY BYPASS GRAFTING (CABG) X 3 USING LEFT INTERNAL MAMMARY ARTERY AND RIGHT SAPHENOUS VEIN- ENDOSCOPICALLY HARVESTED;  Surgeon: Melrose Nakayama, MD;  Location: [place];  Service: Open Heart Surgery;  Laterality: N/A;  . LEFT HEART CATH AND CORONARY ANGIOGRAPHY N/A 07/13/2017   Procedure: LEFT HEART CATH AND CORONARY ANGIOGRAPHY;  Surgeon: Martinique, Peter M, MD;  Location: [facility];  Service: Cardiovascular;  Laterality: N/A;  . LEFT HEART CATH AND CORS/GRAFTS ANGIOGRAPHY N/A 11/03/2017   Procedure: LEFT HEART CATH AND CORS/GRAFTS ANGIOGRAPHY;  Surgeon: Troy Sine, MD;  Location: [facility];  Service: Cardiovascular;  Laterality: N/A;  . TEE WITHOUT CARDIOVERSION N/A 07/15/2017   Procedure: TRANSESOPHAGEAL ECHOCARDIOGRAM (TEE);  Surgeon: Melrose Nakayama, MD;  Location: [facility];  Service: Open Heart Surgery;  Laterality: N/A;  . TUBAL LIGATION    . TYMPANOPLASTY Left    fluid from ear drum     FAMILY HISTORY:  Family History  Problem Relation Age of Onset  . Diabetes Sister        AODM  . Heart disease Sister 76       CABG  . Breast cancer Sister   . Heart disease Brother 32       In his 26s  . Colon cancer Maternal Uncle 62  . Diabetes Sister        AODM  . Hypertension Daughter   . Hypertension Daughter   . Breast cancer Cousin        PATERNAL COUSIN  . Heart attack Father        In his 66s  . Stroke Mother   . Stroke Maternal Grandmother   . Diabetes Maternal Grandfather        d. 109  . Heart attack Paternal Grandmother   . Colon cancer Maternal Uncle 85  . Breast cancer Cousin        dx in her 68s; mat first cousin  . Breast cancer  Cousin        dx 31s; d. 41s  . Breast cancer Niece 56     SOCIAL HISTORY:  reports that she has quit smoking. She quit smokeless tobacco use about 15 months ago.  Her smokeless tobacco use included snuff. She reports current alcohol use of about 1.0 standard drinks of alcohol per week. She reports that she does not use drugs. The patient is married and lives in [place].  The patient reports that her sister is another patient of Dr. Ida Rogue.   ALLERGIES: Sulfa antibiotics and Sulfasalazine   MEDICATIONS:  Current Outpatient Medications  Medication Sig Dispense Refill  . acetaminophen (TYLENOL) 325 MG tablet Take 2 tablets (650 mg total) by mouth every 6 (six) hours as needed for mild pain.    Marland Kitchen aspirin EC 81 MG tablet Take 81 mg  by mouth daily.    Marland Kitchen atorvastatin (LIPITOR) 80 MG tablet Take 1 tablet (80 mg total) by mouth daily at 6 PM. 90 tablet 3  . Cholecalciferol (VITAMIN D PO) Take 1 tablet by mouth daily.     . clopidogrel (PLAVIX) 75 MG tablet Take 1 tablet by mouth once daily 90 tablet 3  . Cyanocobalamin (VITAMIN B-12 PO) Take 1 tablet by mouth daily.    Marland Kitchen dexamethasone (DECADRON) 4 MG tablet Take 1 tablet (4 mg total) by mouth daily. Start the day after chemotherapy for 2 days. Take with food. 30 tablet 1  . fish oil-omega-3 fatty acids 1000 MG capsule Take 1 g by mouth daily.    . furosemide (LASIX) 40 MG tablet Take 1 tablet (40 mg total) by mouth daily. 30 tablet 1  . HYDROcodone-acetaminophen (NORCO/VICODIN) 5-325 MG tablet Take 1-2 tablets by mouth every 6 (six) hours as needed for moderate pain or severe pain. 10 tablet 0  . levothyroxine (SYNTHROID, LEVOTHROID) 75 MCG tablet Take 75 mcg by mouth daily.    Marland Kitchen losartan (COZAAR) 25 MG tablet Take 0.5 tablets (12.5 mg total) by mouth daily. 45 tablet 3  . metoprolol tartrate (LOPRESSOR) 25 MG tablet Take 1 tablet (25 mg total) by mouth 2 (two) times daily. 180 tablet 3  . prochlorperazine (COMPAZINE) 10 MG tablet Take 1  tablet (10 mg total) by mouth 3 (three) times daily before meals. 30 tablet 1  . VITAMIN E PO Take 1 tablet by mouth daily.      No current facility-administered medications for this encounter.      REVIEW OF SYSTEMS: On review of systems, the patient reports that she is doing well overall.  She reports that chemotherapy has been going pretty well thus far.  She denies any concerns with her healing, and states that she feels as though she is done quite well overall.  She denies any chest pain, shortness of breath, cough, fevers, chills, night sweats, unintended weight changes. She denies any bowel or bladder disturbances, and denies abdominal pain, nausea or vomiting. She denies any new musculoskeletal or joint aches or pains. A complete review of systems is obtained and is otherwise negative.     PHYSICAL EXAM:  Wt Readings from Last 3 Encounters:  11/16/18 200 lb (90.7 kg)  11/14/18 208 lb (94.3 kg)  11/03/18 203 lb 9.6 oz (92.4 kg)  Unable to assess due to the nature of encounter ECOG = 0  0 - Asymptomatic (Fully active, able to carry on all predisease activities without restriction)  1 - Symptomatic but completely ambulatory (Restricted in physically strenuous activity but ambulatory and able to carry out work of a light or sedentary nature. For example, light housework, office work)  2 - Symptomatic, <50% in bed during the day (Ambulatory and capable of all self care but unable to carry out any work activities. Up and about more than 50% of waking hours)  3 - Symptomatic, >50% in bed, but not bedbound (Capable of only limited self-care, confined to bed or chair 50% or more of waking hours)  4 - Bedbound (Completely disabled. Cannot carry on any self-care. Totally confined to bed or chair)  5 - Death   Eustace Pen MM, Creech RH, Tormey DC, et al. [phone]). "Toxicity and response criteria of the [facility] Group". [place] Oncol. 5 (6): 649-55    LABORATORY DATA:   Lab Results  Component Value Date   WBC 12.2 (H) 11/14/2018  HGB 13.5 11/14/2018   HCT 37.5 11/14/2018   MCV 83.7 11/14/2018   PLT 299 11/14/2018   Lab Results  Component Value Date   NA 140 11/14/2018   K 4.2 11/14/2018   CL 105 11/14/2018   CO2 26 11/14/2018   Lab Results  Component Value Date   ALT 17 11/14/2018   AST 17 11/14/2018   ALKPHOS 80 11/14/2018   BILITOT 0.4 11/14/2018      RADIOGRAPHY: No results found.     IMPRESSION/PLAN: 1. Stage IB, pT1cN0M0 grade 2 triple negative invasive ductal carcinoma of the left breast. Dr. Lisbeth Renshaw has reviewed her case and recommends a course of radiotherapy for in the adjuvant setting.  Dr. Jana Hakim would like her to receive concurrent treatment for cycles 3-4-5 of her CMF chemotherapy.  We discussed the rationale for treatment to reduce the risk of local recurrence. We discussed the risks, benefits, short, and long term effects of radiotherapy, and the patient is interested in proceeding at the appropriate time.  Given the ongoing chemotherapy, it is recommended that she receive 6-1/2 weeks of daily treatment.  At the end of the conversation the patient is interested in moving forward.  Verbal consent is obtained but we will discuss this and sign formal copies of her consent on Monday November 20, 2018.  We anticipate starting her treatment on 12/05/2018 along with her next course of chemotherapy.   Given current concerns for patient exposure during the COVID-19 pandemic, this encounter was conducted via telephone.  The patient has given verbal consent for this type of encounter. The time spent during this encounter was 25 minutes and 50% of that time was spent in the coordination of his care. The attendants for this meeting include Shona Simpson, [facility] and Atilano Ina  During the encounter, Shona Simpson [facility] was located at [facility] Radiation Oncology Department.  Gloria Mccoy  was located at home.      Carola Rhine, PAC

## 2018-11-16 NOTE — Progress Notes (Signed)
MTX removed from #3-4-5 as she will be receiving concurrent XRT  GM

## 2018-11-16 NOTE — Progress Notes (Signed)
Ht 5\' 3"  (1.6 m)   Wt 200 lb (90.7 kg)   BMI 35.43 kg/m   Wt Readings from Last 3 Encounters:  11/16/18 200 lb (90.7 kg)  11/14/18 208 lb (94.3 kg)  11/03/18 203 lb 9.6 oz (92.4 kg)

## 2018-11-17 ENCOUNTER — Ambulatory Visit: Payer: Medicare HMO | Admitting: Radiation Oncology

## 2018-11-17 ENCOUNTER — Encounter: Payer: Self-pay | Admitting: *Deleted

## 2018-11-20 ENCOUNTER — Ambulatory Visit
Admission: RE | Admit: 2018-11-20 | Discharge: 2018-11-20 | Disposition: A | Discharge status: Home / Self Care | Payer: Medicare HMO | Source: Ambulatory Visit | Attending: Radiation Oncology | Admitting: Radiation Oncology

## 2018-11-20 ENCOUNTER — Other Ambulatory Visit: Payer: Self-pay

## 2018-11-20 DIAGNOSIS — C50512 Malignant neoplasm of lower-outer quadrant of left female breast: Secondary | ICD-10-CM | POA: Insufficient documentation

## 2018-11-20 DIAGNOSIS — Z171 Estrogen receptor negative status [ER-]: Secondary | ICD-10-CM | POA: Insufficient documentation

## 2018-11-20 DIAGNOSIS — Z510 Encounter for antineoplastic radiation therapy: Secondary | ICD-10-CM | POA: Diagnosis not present

## 2018-11-21 ENCOUNTER — Telehealth: Payer: Self-pay | Admitting: *Deleted

## 2018-11-21 NOTE — Telephone Encounter (Signed)
This RN spoke with pt's daughter per her call stating concern due to issues " my mom is having a sore area on the back of her head- started over the weekend and continuing.  [name] gave her mother 2 aspirins for discomfort today.  She also states " mom is more sleepy since her last treatment ".  Per further inquiry - pt is very alert and oriented when awake. No noted issues with ambulation. She does have decreased appetite but is not nauseated. She does have food aversions.  This RN discussed above including better to use tylenol over aspirin during chemotherapy.  Discussed headache in focal area may be treated with ice and tylenol.  Increased sleepiness discussed with possible causes relating to current therapy side effects as well as emotional and physical stress the patient is under.  Of note pt had cardiac stents placed prior to cancer diagnosis- this RN did inquire with daughter if symptoms could be cardiac related- pt is scheduled to see cardiologist next week.  This RN reviewed with daughter side effects of chemo and interventions- including known food aversions - ideally goal is for hydration to be most important and then diet- and to allow pt choices of food that she " feels like eating " [name] states " she likes pizza ".  This RN informed [name] that pt can be seen by [place] declined visit at present and verbalized understanding to call if further concerns.  This note will be sent to MD for review of phone communication.

## 2018-11-26 NOTE — Progress Notes (Signed)
  Radiation Oncology         [phone] ________________________________  Name: Gloria Mccoy MRN: [id number]  Date: 11/20/2018  DOB: [birth year]  DIAGNOSIS:     ICD-10-CM   1. Malignant neoplasm of lower-outer quadrant of left breast of female, estrogen receptor negative ([place]) C50.512    Z17.1      SIMULATION AND TREATMENT PLANNING NOTE  The patient presented for simulation prior to beginning her course of radiation treatment for her diagnosis of left-sided breast cancer. The patient was placed in a supine position on a breast board. A customized vac-lock bag was constructed and this complex treatment device will be used on a daily basis during her treatment. In this fashion, a CT scan was obtained through the chest area and an isocenter was placed near the chest wall within the breast.  The patient will be planned to receive a course of radiation initially to a dose of 50.4 Gy. This will consist of a whole breast radiotherapy technique. To accomplish this, 2 customized blocks have been designed which will correspond to medial and lateral whole breast tangent fields. This treatment will be accomplished at 1.8 Gy per fraction. A forward planning technique will also be evaluated to determine if this approach improves the plan. It is anticipated that the patient will then receive a 10 Gy boost to the seroma cavity which has been contoured. This will be accomplished at 2 Gy per fraction.   This initial treatment will consist of a 3-D conformal technique. The seroma has been contoured as the primary target structure. Additionally, dose volume histograms of both this target as well as the lungs and heart will also be evaluated. Such an approach is necessary to ensure that the target area is adequately covered while the nearby critical  normal structures are adequately spared.  Plan:  The final anticipated total dose therefore will correspond to 60.4 Gy.   Special treatment procedure The  patient will also receive concurrent chemotherapy during the treatment. The patient may therefore experience increased toxicity or side effects and the patient will be monitored for such problems. This may require extra lab work as necessary. This therefore constitutes a special treatment procedure.     _______________________________   Jodelle Gross, MD, PhD

## 2018-11-26 NOTE — Progress Notes (Signed)
  Radiation Oncology         [phone] ________________________________  Name: Gloria Mccoy MRN: [id number]  Date: 11/20/2018  DOB: 09-01-1945  Optical Surface Tracking Plan:  Since intensity modulated radiotherapy (IMRT) and 3D conformal radiation treatment methods are predicated on accurate and precise positioning for treatment, intrafraction motion monitoring is medically necessary to ensure accurate and safe treatment delivery.  The ability to quantify intrafraction motion without excessive ionizing radiation dose can only be performed with optical surface tracking. Accordingly, surface imaging offers the opportunity to obtain 3D measurements of patient position throughout IMRT and 3D treatments without excessive radiation exposure.  I am ordering optical surface tracking for this patient's upcoming course of radiotherapy. ________________________________  Kyung Rudd, MD 11/26/2018 10:34 AM    Reference:   Ursula Alert, J, et al. Surface imaging-based analysis of intrafraction motion for breast radiotherapy patients.Journal of [place], n. 6, nov. 2014. ISSN [id number].   Available at: <http://www.jacmp.org/index.php/jacmp/article/view/4957>.

## 2018-11-28 ENCOUNTER — Ambulatory Visit: Payer: Medicare HMO | Admitting: Radiation Oncology

## 2018-11-28 DIAGNOSIS — E063 Autoimmune thyroiditis: Secondary | ICD-10-CM | POA: Diagnosis not present

## 2018-11-28 DIAGNOSIS — Z0001 Encounter for general adult medical examination with abnormal findings: Secondary | ICD-10-CM | POA: Diagnosis not present

## 2018-11-28 DIAGNOSIS — Z1389 Encounter for screening for other disorder: Secondary | ICD-10-CM | POA: Diagnosis not present

## 2018-11-28 DIAGNOSIS — M546 Pain in thoracic spine: Secondary | ICD-10-CM | POA: Diagnosis not present

## 2018-11-28 DIAGNOSIS — Z6835 Body mass index (BMI) 35.0-35.9, adult: Secondary | ICD-10-CM | POA: Diagnosis not present

## 2018-11-28 DIAGNOSIS — I2510 Atherosclerotic heart disease of native coronary artery without angina pectoris: Secondary | ICD-10-CM | POA: Diagnosis not present

## 2018-11-28 DIAGNOSIS — E7849 Other hyperlipidemia: Secondary | ICD-10-CM | POA: Diagnosis not present

## 2018-11-28 DIAGNOSIS — I10 Essential (primary) hypertension: Secondary | ICD-10-CM | POA: Diagnosis not present

## 2018-11-29 DIAGNOSIS — Z0001 Encounter for general adult medical examination with abnormal findings: Secondary | ICD-10-CM | POA: Diagnosis not present

## 2018-11-29 DIAGNOSIS — Z171 Estrogen receptor negative status [ER-]: Secondary | ICD-10-CM | POA: Diagnosis not present

## 2018-11-29 DIAGNOSIS — C50512 Malignant neoplasm of lower-outer quadrant of left female breast: Secondary | ICD-10-CM | POA: Diagnosis not present

## 2018-11-29 DIAGNOSIS — E7849 Other hyperlipidemia: Secondary | ICD-10-CM | POA: Diagnosis not present

## 2018-11-29 DIAGNOSIS — Z510 Encounter for antineoplastic radiation therapy: Secondary | ICD-10-CM | POA: Diagnosis not present

## 2018-11-29 DIAGNOSIS — Z6835 Body mass index (BMI) 35.0-35.9, adult: Secondary | ICD-10-CM | POA: Diagnosis not present

## 2018-11-29 DIAGNOSIS — Z1389 Encounter for screening for other disorder: Secondary | ICD-10-CM | POA: Diagnosis not present

## 2018-11-29 DIAGNOSIS — E6609 Other obesity due to excess calories: Secondary | ICD-10-CM | POA: Diagnosis not present

## 2018-12-03 ENCOUNTER — Encounter (HOSPITAL_COMMUNITY): Payer: Self-pay | Admitting: Emergency Medicine

## 2018-12-03 ENCOUNTER — Other Ambulatory Visit: Payer: Self-pay

## 2018-12-03 ENCOUNTER — Emergency Department (HOSPITAL_COMMUNITY)
Admission: EM | Admit: 2018-12-03 | Discharge: 2018-12-03 | Disposition: A | Discharge status: Home / Self Care | Payer: Medicare HMO | Attending: Emergency Medicine | Admitting: Emergency Medicine

## 2018-12-03 DIAGNOSIS — S61214A Laceration without foreign body of right ring finger without damage to nail, initial encounter: Secondary | ICD-10-CM | POA: Diagnosis not present

## 2018-12-03 DIAGNOSIS — Y939 Activity, unspecified: Secondary | ICD-10-CM | POA: Insufficient documentation

## 2018-12-03 DIAGNOSIS — Y999 Unspecified external cause status: Secondary | ICD-10-CM | POA: Insufficient documentation

## 2018-12-03 DIAGNOSIS — C50512 Malignant neoplasm of lower-outer quadrant of left female breast: Secondary | ICD-10-CM | POA: Diagnosis not present

## 2018-12-03 DIAGNOSIS — E039 Hypothyroidism, unspecified: Secondary | ICD-10-CM | POA: Diagnosis not present

## 2018-12-03 DIAGNOSIS — I2510 Atherosclerotic heart disease of native coronary artery without angina pectoris: Secondary | ICD-10-CM | POA: Diagnosis not present

## 2018-12-03 DIAGNOSIS — S61210A Laceration without foreign body of right index finger without damage to nail, initial encounter: Secondary | ICD-10-CM | POA: Diagnosis not present

## 2018-12-03 DIAGNOSIS — Z7902 Long term (current) use of antithrombotics/antiplatelets: Secondary | ICD-10-CM | POA: Insufficient documentation

## 2018-12-03 DIAGNOSIS — Y929 Unspecified place or not applicable: Secondary | ICD-10-CM | POA: Diagnosis not present

## 2018-12-03 DIAGNOSIS — Z23 Encounter for immunization: Secondary | ICD-10-CM | POA: Diagnosis not present

## 2018-12-03 DIAGNOSIS — Z7982 Long term (current) use of aspirin: Secondary | ICD-10-CM | POA: Diagnosis not present

## 2018-12-03 DIAGNOSIS — Z951 Presence of aortocoronary bypass graft: Secondary | ICD-10-CM | POA: Insufficient documentation

## 2018-12-03 DIAGNOSIS — W268XXA Contact with other sharp object(s), not elsewhere classified, initial encounter: Secondary | ICD-10-CM | POA: Diagnosis not present

## 2018-12-03 DIAGNOSIS — I10 Essential (primary) hypertension: Secondary | ICD-10-CM | POA: Insufficient documentation

## 2018-12-03 DIAGNOSIS — Z79899 Other long term (current) drug therapy: Secondary | ICD-10-CM | POA: Insufficient documentation

## 2018-12-03 DIAGNOSIS — I252 Old myocardial infarction: Secondary | ICD-10-CM | POA: Diagnosis not present

## 2018-12-03 MED ORDER — TETANUS-DIPHTH-ACELL PERTUSSIS 5-2.5-18.5 LF-MCG/0.5 IM SUSP
0.5000 mL | Freq: Once | INTRAMUSCULAR | Status: AC
  Administered 2018-12-03: 0.5 mL via INTRAMUSCULAR
  Filled 2018-12-03: qty 0.5

## 2018-12-03 NOTE — ED Provider Notes (Signed)
[facility] EMERGENCY DEPARTMENT Provider Note   CSN: [id number] Arrival date & time: 12/03/18  6213    History   Chief Complaint Chief Complaint  Patient presents with  . Laceration    HPI Gloria Mccoy is a 73 y.o. female.     HPI  The patient is a 73 year old female, she is on blood thinners because of her underlying history of coronary disease.  She is also recently been diagnosed with breast cancer and is been treated for this left-sided mass.  She is currently on chemotherapy.  She presents today with a laceration to the right index finger on the pad of the finger distally.  This occurred when she cut her finger on a can.  The patient states that it has been bleeding for the last 2-1/2 hours since it occurred, the bleeding is mild, controlled with pressure and a makeshift bandage prior to arrival.  Symptoms are persistent and mild  Past Medical History:  Diagnosis Date  . Arthritis    "hands sometimes" (07/13/2017)  . Coronary artery disease    a. s/p CABG x3 in 06/2017 with LIMA-LAD, SVG-D1, and SVG-RI.  b. 10/2017: cath showing a widely patent LIMA-LAD with ostial occlusion of the SVG-RI and subtotally occluded atretic SVG-D1. Graft occlusion thought to be 2ry to improvement in pre-CABG stenoses.   . Essential hypertension   . Family history of adverse reaction to anesthesia    sister had a complication that was stated she had a "foggy" episode after her breast surgery was readmitted 1 day post op after being discharged from the hospital. Sister also has significant lung problems that could have contributed to this   . Family history of breast cancer   . Family history of colon cancer   . Hypothyroid   . Meniere disease   . Myocardial infarction ([place]) 06/2017   during cardaic rehab  . Obesity   . Osteopenia 01/2012   T score -1.3 FRAX 7.9%/0.6%    Patient Active Problem List   Diagnosis Date Noted  . Genetic testing 08/29/2018  . Family history of breast  cancer   . Family history of colon cancer   . Malignant neoplasm of lower-outer quadrant of left breast of female, estrogen receptor negative ([place]) 08/16/2018  . NSTEMI (non-ST elevated myocardial infarction) ([place]) 11/02/2017  . S/P CABG x 3 07/15/2017  . Coronary artery disease 07/15/2017  . Unstable angina ([place]) 07/13/2017  . Essential hypertension 07/13/2017  . Obesity 07/13/2017  . Hyperglycemia 07/13/2017  . Hypothyroid 01/06/2012  . Meniere disease   . Arthritis     Past Surgical History:  Procedure Laterality Date  . BREAST LUMPECTOMY WITH RADIOACTIVE SEED AND SENTINEL LYMPH NODE BIOPSY Left 09/01/2018   Procedure: LEFT BREAST LUMPECTOMY WITH RADIOACTIVE SEED AND SENTINEL LYMPH NODE BIOPSY;  Surgeon: Jovita Kussmaul, MD;  Location: [place];  Service: General;  Laterality: Left;  . BREAST SURGERY    . CARDIAC CATHETERIZATION  07/13/2017  . CATARACT EXTRACTION W/PHACO Right 01/28/2015   Procedure: CATARACT EXTRACTION PHACO AND INTRAOCULAR LENS PLACEMENT :  CDE:  5.70;  Surgeon: Rutherford Guys, MD;  Location: [facility];  Service: Ophthalmology;  Laterality: Right;  . CATARACT EXTRACTION W/PHACO Left 02/11/2015   Procedure: CATARACT EXTRACTION PHACO AND INTRAOCULAR LENS PLACEMENT (IOC);  Surgeon: Rutherford Guys, MD;  Location: [facility];  Service: Ophthalmology;  Laterality: Left;  CDE: 7.38  . COLONOSCOPY N/A 09/26/2012   Procedure: COLONOSCOPY;  Surgeon: Jamesetta So, MD;  Location: [facility];  Service: Gastroenterology;  Laterality: N/A;  . CORONARY ARTERY BYPASS GRAFT N/A 07/15/2017   Procedure: CORONARY ARTERY BYPASS GRAFTING (CABG) X 3 USING LEFT INTERNAL MAMMARY ARTERY AND RIGHT SAPHENOUS VEIN- ENDOSCOPICALLY HARVESTED;  Surgeon: Melrose Nakayama, MD;  Location: [place];  Service: Open Heart Surgery;  Laterality: N/A;  . LEFT HEART CATH AND CORONARY ANGIOGRAPHY N/A 07/13/2017   Procedure: LEFT HEART CATH AND CORONARY ANGIOGRAPHY;  Surgeon: Martinique, Peter M, MD;  Location: [place] [facility];  Service: Cardiovascular;  Laterality: N/A;  . LEFT HEART CATH AND CORS/GRAFTS ANGIOGRAPHY N/A 11/03/2017   Procedure: LEFT HEART CATH AND CORS/GRAFTS ANGIOGRAPHY;  Surgeon: Troy Sine, MD;  Location: [place] [facility];  Service: Cardiovascular;  Laterality: N/A;  . TEE WITHOUT CARDIOVERSION N/A 07/15/2017   Procedure: TRANSESOPHAGEAL ECHOCARDIOGRAM (TEE);  Surgeon: Melrose Nakayama, MD;  Location: [place];  Service: Open Heart Surgery;  Laterality: N/A;  . TUBAL LIGATION    . TYMPANOPLASTY Left    fluid from ear drum     OB History    Gravida  2   Para  2   Term  2   Preterm      AB  0   Living  2     SAB      TAB      Ectopic      Multiple      Live Births               Home Medications    Prior to Admission medications   Medication Sig Start Date End Date Taking? Authorizing Provider  acetaminophen (TYLENOL) 325 MG tablet Take 2 tablets (650 mg total) by mouth every 6 (six) hours as needed for mild pain. 07/20/17   Nani Skillern, PA-C  aspirin EC 81 MG tablet Take 81 mg by mouth daily.    [provider]  atorvastatin (LIPITOR) 80 MG tablet Take 1 tablet (80 mg total) by mouth daily at 6 PM. 11/03/17   Kroeger, Lorelee Cover., PA-C  Cholecalciferol (VITAMIN D PO) Take 1 tablet by mouth daily.     [provider]  clopidogrel (PLAVIX) 75 MG tablet Take 1 tablet by mouth once daily 11/15/18   Arnoldo Lenis, MD  Cyanocobalamin (VITAMIN B-12 PO) Take 1 tablet by mouth daily.    [provider]  dexamethasone (DECADRON) 4 MG tablet Take 1 tablet (4 mg total) by mouth daily. Start the day after chemotherapy for 2 days. Take with food. 10/20/18   Magrinat, Virgie Dad, MD  fish oil-omega-3 fatty acids 1000 MG capsule Take 1 g by mouth daily.    [provider]  furosemide (LASIX) 40 MG tablet Take 1 tablet (40 mg total) by mouth daily. 09/06/17   Melrose Nakayama, MD  HYDROcodone-acetaminophen (NORCO/VICODIN) 5-325  MG tablet Take 1-2 tablets by mouth every 6 (six) hours as needed for moderate pain or severe pain. 09/01/18   Jovita Kussmaul, MD  levothyroxine (SYNTHROID, LEVOTHROID) 75 MCG tablet Take 75 mcg by mouth daily.    [provider]  losartan (COZAAR) 25 MG tablet Take 0.5 tablets (12.5 mg total) by mouth daily. 08/22/18 11/20/18  Arnoldo Lenis, MD  metoprolol tartrate (LOPRESSOR) 25 MG tablet Take 1 tablet (25 mg total) by mouth 2 (two) times daily. 05/04/18   Arnoldo Lenis, MD  prochlorperazine (COMPAZINE) 10 MG tablet Take 1 tablet (10 mg total) by mouth 3 (three) times daily before  meals. 10/20/18   Magrinat, Virgie Dad, MD  VITAMIN E PO Take 1 tablet by mouth daily.     [provider]    Family History Family History  Problem Relation Age of Onset  . Diabetes Sister        AODM  . Heart disease Sister 55       CABG  . Breast cancer Sister   . Heart disease Brother 67       In his 100s  . Colon cancer Maternal Uncle 46  . Diabetes Sister        AODM  . Hypertension Daughter   . Hypertension Daughter   . Breast cancer Cousin        PATERNAL COUSIN  . Heart attack Father        In his 61s  . Stroke Mother   . Stroke Maternal Grandmother   . Diabetes Maternal Grandfather        d. 109  . Heart attack Paternal Grandmother   . Colon cancer Maternal Uncle 28  . Breast cancer Cousin        dx in her 75s; mat first cousin  . Breast cancer Cousin        dx 66s; d. 65s  . Breast cancer Niece 30    Social History Social History   Tobacco Use  . Smoking status: Never Smoker  . Smokeless tobacco: Former Systems developer    Types: Snuff  Substance Use Topics  . Alcohol use: Yes    Alcohol/week: 1.0 standard drinks    Types: 1 Standard drinks or equivalent per week    Comment: occasionally  . Drug use: No     Allergies   Sulfa antibiotics and Sulfasalazine   Review of Systems Review of Systems  Constitutional: Negative for fever.  Gastrointestinal: Negative for  vomiting.  Skin: Positive for wound.       Laceration  Neurological: Negative for weakness and numbness.     Physical Exam Updated Vital Signs Ht 1.575 m (5\' 2" )   Wt 92.5 kg   BMI 37.31 kg/m   Physical Exam Constitutional:      General: She is not in acute distress.    Appearance: She is well-developed. She is not diaphoretic.  HENT:     Head: Normocephalic.  Eyes:     General: No scleral icterus.    Conjunctiva/sclera: Conjunctivae normal.  Cardiovascular:     Rate and Rhythm: Normal rate and regular rhythm.  Pulmonary:     Effort: Pulmonary effort is normal.     Breath sounds: Normal breath sounds.  Musculoskeletal: Normal range of motion.        General: Tenderness ( ttp over the laceration site) present.  Skin:    General: Skin is warm and dry.     Comments: Laceration located on right index finger on the pad The Laceration is flap shaped The depth is less than 1 mm The length is approximately 1 cm  The flap is devascularized very superficial epidermis  Neurological:     Mental Status: She is alert.     Coordination: Coordination normal.     Comments: Sensation and motor intact      ED Treatments / Results  Labs (all labs ordered are listed, but only abnormal results are displayed) Labs Reviewed - No data to display  EKG None  Radiology No results found.  Procedures Procedures (including critical care time)  Medications Ordered in ED Medications  Tdap (BOOSTRIX) injection  0.5 mL (has no administration in time range)     Initial Impression / Assessment and Plan / ED Course  I have reviewed the triage vital signs and the nursing notes.  Pertinent labs & imaging results that were available during my care of the patient were reviewed by me and considered in my medical decision making (see chart for details).        The wound was irrigated, will place on quick clot and a dressing.  The patient does not have any other significant findings.   This is not a repairable laceration, there is no blood flow to the piece of tissue that is filleted off the surface of the finger.  The patient was informed of the treatment plan and is agreeable, tetanus will be updated as needed.  Nurse applied dressing prior to discharge  Final Clinical Impressions(s) / ED Diagnoses   Final diagnoses:  Laceration of right index finger without foreign body without damage to nail, initial encounter    ED Discharge Orders    None       Noemi Chapel, MD 12/03/18 1013

## 2018-12-03 NOTE — Discharge Instructions (Signed)
Please soak your finger in water tomorrow morning prior to trying to take off the dressing.  If this continues to bleed you may rewrap the finger with the left over special gauze.  Tylenol or ibuprofen for finger discomfort.  Please keep the finger clean and dry until you change the dressing.  Apply antibiotic ointment starting tomorrow.  Seek medical exam for severe or worsening pain swelling or ongoing bleeding that soaks through the gauze.

## 2018-12-03 NOTE — ED Triage Notes (Signed)
Patient c/o laceration to right index finger. Per patient cut finger with metal can lid. Patient need tdap. Patient reports bleeding x1 hour, takes xarelto. Small amount of bleeding at this time.

## 2018-12-04 ENCOUNTER — Other Ambulatory Visit: Payer: Self-pay | Admitting: *Deleted

## 2018-12-04 MED ORDER — METOPROLOL TARTRATE 25 MG PO TABS: 25.0000 mg | ORAL_TABLET | Freq: Two times a day (BID) | ORAL | 3 refills | Status: DC

## 2018-12-04 NOTE — Progress Notes (Addendum)
Gloria Mccoy  [phone] [phone]    ID: Gloria Mccoy DOB: [birth year]  MR#: [id number]  QMV#:[id number]  Patient Care Team: Redmond School, MD as PCP - General (Internal Medicine) Harl Bowie, Alphonse Guild, MD as PCP - Cardiology (Cardiology) Magrinat, Virgie Dad, MD as Consulting Physician (Oncology) Kyung Rudd, MD as Consulting Physician (Radiation Oncology) Rutherford Guys, MD as Consulting Physician (Ophthalmology) Jovita Kussmaul, MD as Consulting Physician (General Surgery) Mauro Kaufmann, RN as Oncology Nurse Navigator Rockwell Germany, RN as Oncology Nurse Navigator OTHER MD:    CHIEF COMPLAINT: Triple negative breast cancer  CURRENT TREATMENT: Adjuvant chemotherapy, adjuvant radiation   INTERVAL HISTORY: Gloria Mccoy was seen today for follow-up and treatment of her triple negative breast cancer.   She continues on adjuvant chemotherapy consisting of cyclophosphamide, methotrexate, and fluorouracil (CMF), repeated every 21 days x 8. Methotrexate will be held for the next 3 cycles as she undergoes radiation treatments. Today is day 1 cycle 3.   Since her last visit, she followed up with Dr. Lisbeth Renshaw on 11/16/2018. She is scheduled to begin radiation treatments later today. She states laying on the table is difficult because of back pain.  Since her last visit here, she has not undergone any additional studies.     REVIEW OF SYSTEMS: Gloria Mccoy reports her back has been hurting, but today is better. She denies any bowel issues. She recently cut her finger on a can of beans. A detailed review of systems was otherwise entirely negative.   HISTORY OF CURRENT ILLNESS: From the original intake note:  Gloria Mccoy had routine screening mammography on 07/12/2018 showing a possible abnormality in the left breast. She underwent unilateral left diagnostic mammography with tomography and left breast ultrasonography at [facility] on 07/25/2018 showing:  Breast Density Category B. There is a mass with indistinct margins in the lower inner posterior left breast measuring approximately 0.6 cm. Targeted ultrasound of the lower left breast was performed. There is a hypoechoic mass with margin irregularity in the left breast at 7 o'clock, 7 cm from the nipple measuring 0.7 x 0.5 x 0.6 cm. No lymphadenopathy seen in the left axilla.   Accordingly on 08/01/2018 she proceeded to biopsy of the left breast area in question. The pathology from this procedure showed ([id number]): invasive ductal carcinoma, grade II. Prognostic indicators significant for: estrogen receptor, 0% negative and progesterone receptor, 0% negative. Proliferation marker Ki67 at 40%. HER2 negative (1+) by immunohistochemistry.   The patient's subsequent history is as detailed below.   PAST MEDICAL HISTORY: Past Medical History:  Diagnosis Date  . Arthritis    "hands sometimes" (07/13/2017)  . Coronary artery disease    a. s/p CABG x3 in 06/2017 with LIMA-LAD, SVG-D1, and SVG-RI.  b. 10/2017: cath showing a widely patent LIMA-LAD with ostial occlusion of the SVG-RI and subtotally occluded atretic SVG-D1. Graft occlusion thought to be 2ry to improvement in pre-CABG stenoses.   . Essential hypertension   . Family history of adverse reaction to anesthesia    sister had a complication that was stated she had a "foggy" episode after her breast surgery was readmitted 1 day post op after being discharged from the hospital. Sister also has significant lung problems that could have contributed to this   . Family history of breast cancer   . Family history of colon cancer   . Hypothyroid   . Meniere disease   . Myocardial infarction ([place]) 06/2017   during cardaic rehab  .  Obesity   . Osteopenia 01/2012   T score -1.3 FRAX 7.9%/0.6%     PAST SURGICAL HISTORY: Past Surgical History:  Procedure Laterality Date  . BREAST LUMPECTOMY WITH RADIOACTIVE SEED AND SENTINEL LYMPH NODE BIOPSY Left  09/01/2018   Procedure: LEFT BREAST LUMPECTOMY WITH RADIOACTIVE SEED AND SENTINEL LYMPH NODE BIOPSY;  Surgeon: Jovita Kussmaul, MD;  Location: [facility];  Service: General;  Laterality: Left;  . BREAST SURGERY    . CARDIAC CATHETERIZATION  07/13/2017  . CATARACT EXTRACTION W/PHACO Right 01/28/2015   Procedure: CATARACT EXTRACTION PHACO AND INTRAOCULAR LENS PLACEMENT :  CDE:  5.70;  Surgeon: Rutherford Guys, MD;  Location: [facility];  Service: Ophthalmology;  Laterality: Right;  . CATARACT EXTRACTION W/PHACO Left 02/11/2015   Procedure: CATARACT EXTRACTION PHACO AND INTRAOCULAR LENS PLACEMENT (IOC);  Surgeon: Rutherford Guys, MD;  Location: [facility];  Service: Ophthalmology;  Laterality: Left;  CDE: 7.38  . COLONOSCOPY N/A 09/26/2012   Procedure: COLONOSCOPY;  Surgeon: Jamesetta So, MD;  [facility];  Service: Gastroenterology;  Laterality: N/A;  . CORONARY ARTERY BYPASS GRAFT N/A 07/15/2017   Procedure: CORONARY ARTERY BYPASS GRAFTING (CABG) X 3 USING LEFT INTERNAL MAMMARY ARTERY AND RIGHT SAPHENOUS VEIN- ENDOSCOPICALLY HARVESTED;  Surgeon: Melrose Nakayama, MD;  Location: [facility];  Service: Open Heart Surgery;  Laterality: N/A;  . LEFT HEART CATH AND CORONARY ANGIOGRAPHY N/A 07/13/2017   Procedure: LEFT HEART CATH AND CORONARY ANGIOGRAPHY;  Surgeon: Martinique, Peter M, MD;  Location: [facility];  Service: Cardiovascular;  Laterality: N/A;  . LEFT HEART CATH AND CORS/GRAFTS ANGIOGRAPHY N/A 11/03/2017   Procedure: LEFT HEART CATH AND CORS/GRAFTS ANGIOGRAPHY;  Surgeon: Troy Sine, MD;  Location: [facility];  Service: Cardiovascular;  Laterality: N/A;  . TEE WITHOUT CARDIOVERSION N/A 07/15/2017   Procedure: TRANSESOPHAGEAL ECHOCARDIOGRAM (TEE);  Surgeon: Melrose Nakayama, MD;  Location: [facility];  Service: Open Heart Surgery;  Laterality: N/A;  . TUBAL LIGATION    . TYMPANOPLASTY Left    fluid from ear drum     FAMILY HISTORY: Family History  Problem Relation Age of Onset  .  Diabetes Sister        AODM  . Heart disease Sister 38       CABG  . Breast cancer Sister   . Heart disease Brother 56       In his 63s  . Colon cancer Maternal Uncle 77  . Diabetes Sister        AODM  . Hypertension Daughter   . Hypertension Daughter   . Breast cancer Cousin        PATERNAL COUSIN  . Heart attack Father        In his 69s  . Stroke Mother   . Stroke Maternal Grandmother   . Diabetes Maternal Grandfather        d. 109  . Heart attack Paternal Grandmother   . Colon cancer Maternal Uncle 19  . Breast cancer Cousin        dx in her 76s; mat first cousin  . Breast cancer Cousin        dx 22s; d. 75s  . Breast cancer Niece 72   Jamariyah's father died from a myocardial infarction at age 82. Patients' mother died from a stroke at age 74. The patient has 3 brothers and 9 sisters. One of Mina's sisters, [name], was diagnosed with breast cancer at the age of 15. Illianna also has a niece  that was diagnosed with breast cancer at 48, and two cousins that were diagnosed with breast cancer.  She believes 1 of her brothers may have prostate cancer.  Patient denies anyone in her family having ovarian or pancreatic cancer. Marquia has an uncle that was diagnosed with colon cancer and an uncle that was diagnosed with "stomach" cancer.    GYNECOLOGIC HISTORY:  No LMP recorded. Patient is postmenopausal. 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 Her husband, Jason Fila, is a retired Administrator. They have two daughters. Their daughter, [name], lives in [place] and is a housewife.  Their other daughter works at [employer]. Sarenity had one grandchild that is now deceased. She attends a [employer].    ADVANCED DIRECTIVES: Dannisha's husband, Jason Fila, is automatically her healthcare power of attorney.      HEALTH MAINTENANCE: Social History   Tobacco Use  . Smoking status: Never Smoker  . Smokeless tobacco: Former Systems developer    Types: Snuff  Substance Use Topics  . Alcohol use: Yes    Alcohol/week: 1.0 standard drinks    Types: 1 Standard drinks or equivalent per week    Comment: occasionally  . Drug use: No    Colonoscopy: yes, Dr. Arnoldo Morale  PAP:   Bone density: yes, 2016, -1.1 osteopenic   Allergies  Allergen Reactions  . Sulfa Antibiotics Shortness Of Breath  . Sulfasalazine Shortness Of Breath    Current Outpatient Medications  Medication Sig Dispense Refill  . acetaminophen (TYLENOL) 325 MG tablet Take 2 tablets (650 mg total) by mouth every 6 (six) hours as needed for mild pain.    Marland Kitchen aspirin EC 81 MG tablet Take 81 mg by mouth daily.    Marland Kitchen atorvastatin (LIPITOR) 80 MG tablet Take 1 tablet (80 mg total) by mouth daily at 6 PM. 90 tablet 3  . Cholecalciferol (VITAMIN D PO) Take 1 tablet by mouth daily.     . clopidogrel (PLAVIX) 75 MG tablet Take 1 tablet by mouth once daily 90 tablet 3  . Cyanocobalamin (VITAMIN B-12 PO) Take 1 tablet by mouth daily.    Marland Kitchen dexamethasone (DECADRON) 4 MG tablet Take 1 tablet (4 mg total) by mouth daily. Start the day after chemotherapy for 2 days. Take with food. 30 tablet 1  . fish oil-omega-3 fatty acids 1000 MG capsule Take 1 g by mouth daily.    . furosemide (LASIX) 40 MG tablet Take 1 tablet (40 mg total) by mouth daily. 30 tablet 1  . HYDROcodone-acetaminophen (NORCO/VICODIN) 5-325 MG tablet Take 1-2 tablets by mouth every 6 (six) hours as needed for moderate pain or severe pain. 10 tablet 0  . levothyroxine (SYNTHROID, LEVOTHROID) 75 MCG tablet Take 75 mcg by mouth daily.    Marland Kitchen losartan (COZAAR) 25 MG tablet Take 0.5 tablets (12.5 mg total) by mouth daily. 45 tablet 3  . metoprolol tartrate (LOPRESSOR) 25 MG tablet Take 1 tablet (25 mg total) by mouth 2 (two) times daily. 180 tablet 3  . prochlorperazine (COMPAZINE) 10 MG tablet Take 1 tablet  (10 mg total) by mouth 3 (three) times daily before meals. 30 tablet 1  . VITAMIN E PO Take 1 tablet by mouth daily.      No current facility-administered medications for this visit.      OBJECTIVE:  Middle-aged African-American woman who appears well Vitals:   12/05/18 1251  BP: (!) 150/48  Pulse: (!) 58  Resp: 18  Temp: 98.2 F (36.8 C)  SpO2: 96%     Body mass index is 37.66 kg/m.   Wt Readings from Last 3 Encounters:  12/05/18 205 lb 14.4 oz (93.4 kg)  12/03/18 204 lb (92.5 kg)  11/16/18 200 lb (90.7 kg)      ECOG FS:1 - Symptomatic but completely ambulatory  Sclerae unicteric, pupils round and equal No cervical or supraclavicular adenopathy Lungs no rales or rhonchi Heart regular rate and rhythm Abd soft, nontender, positive bowel sounds MSK no focal spinal tenderness, no upper extremity lymphedema Neuro: nonfocal, well oriented, appropriate affect Breasts: The right breast is unremarkable per the left breast status post lumpectomy.  Is currently receiving radiation.  There is no erythema.  Both axillae are benign.   LAB RESULTS:  CMP     Component Value Date/Time   NA 143 12/05/2018 1217   K 3.7 12/05/2018 1217   CL 108 12/05/2018 1217   CO2 24 12/05/2018 1217   GLUCOSE 93 12/05/2018 1217   BUN 11 12/05/2018 1217   CREATININE 0.91 12/05/2018 1217   CALCIUM 9.0 12/05/2018 1217   PROT 7.0 12/05/2018 1217   ALBUMIN 3.6 12/05/2018 1217   AST 22 12/05/2018 1217   ALT 17 12/05/2018 1217   ALKPHOS 80 12/05/2018 1217   BILITOT 0.8 12/05/2018 1217   GFRNONAA >60 12/05/2018 1217   GFRAA >60 12/05/2018 1217    No results found for: TOTALPROTELP, ALBUMINELP, A1GS, A2GS, BETS, BETA2SER, GAMS, MSPIKE, SPEI  No results found for: KPAFRELGTCHN, LAMBDASER, KAPLAMBRATIO  Lab Results  Component Value Date   WBC 9.8 12/05/2018   NEUTROABS 4.7 12/05/2018   HGB 12.8 12/05/2018   HCT 36.5 12/05/2018   MCV 84.7 12/05/2018   PLT 272 12/05/2018    _0 @   No results found for: LABCA2  No components found for: RJJOAC166  No results for input(s): INR in the last 168 hours.  No results found for: LABCA2  No results found for: AYT016  No results found for: WFU932  No results found for: TFT732  No results found for: CA2729  No components found for: HGQUANT  No results found for: CEA1 / No results found for: CEA1   No results found for: AFPTUMOR  No results found for: CHROMOGRNA  No results found for: PSA1  Appointment on 12/05/2018  Component Date Value Ref Range Status  . Sodium 12/05/2018 143  135 - 145 mmol/L Final  . Potassium 12/05/2018 3.7  3.5 - 5.1 mmol/L Final  . Chloride 12/05/2018 108  98 - 111 mmol/L Final  . CO2 12/05/2018 24  22 - 32 mmol/L Final  . Glucose, Bld 12/05/2018 93  70 - 99 mg/dL Final  . BUN 12/05/2018 11  8 - 23 mg/dL Final  . Creatinine, Ser 12/05/2018 0.91  0.44 - 1.00 mg/dL Final  . Calcium 12/05/2018 9.0  8.9 - 10.3 mg/dL Final  . Total Protein 12/05/2018 7.0  6.5 - 8.1 g/dL Final  . Albumin 12/05/2018 3.6  3.5 - 5.0 g/dL Final  . AST 12/05/2018 22  15 - 41 U/L Final  . ALT 12/05/2018 17  0 - 44 U/L Final  . Alkaline Phosphatase 12/05/2018 80  38 - 126 U/L Final  . Total Bilirubin 12/05/2018 0.8  0.3 - 1.2 mg/dL Final  . GFR calc non Af Amer 12/05/2018 >60  >60 mL/min Final  .  GFR calc Af Amer 12/05/2018 >60  >60 mL/min Final  . Anion gap 12/05/2018 11  5 - 15 Final   Performed at [facility], [place] [address], [place], [place] [address]  . WBC 12/05/2018 9.8  4.0 - 10.5 K/uL Final  . RBC 12/05/2018 4.31  3.87 - 5.11 MIL/uL Final  . Hemoglobin 12/05/2018 12.8  12.0 - 15.0 g/dL Final  . HCT 12/05/2018 36.5  36.0 - 46.0 % Final  . MCV 12/05/2018 84.7  80.0 - 100.0 fL Final  . MCH 12/05/2018 29.7  26.0 - 34.0 pg Final  . MCHC 12/05/2018 35.1  30.0 - 36.0 g/dL Final  . RDW 12/05/2018 14.6  11.5 - 15.5 % Final  . Platelets 12/05/2018 272  150 - 400 K/uL Final  . nRBC  12/05/2018 0.0  0.0 - 0.2 % Final  . Neutrophils Relative % 12/05/2018 48  % Final  . Neutro Abs 12/05/2018 4.7  1.7 - 7.7 K/uL Final  . Lymphocytes Relative 12/05/2018 32  % Final  . Lymphs Abs 12/05/2018 3.1  0.7 - 4.0 K/uL Final  . Monocytes Relative 12/05/2018 15  % Final  . Monocytes Absolute 12/05/2018 1.4* 0.1 - 1.0 K/uL Final  . Eosinophils Relative 12/05/2018 3  % Final  . Eosinophils Absolute 12/05/2018 0.3  0.0 - 0.5 K/uL Final  . Basophils Relative 12/05/2018 1  % Final  . Basophils Absolute 12/05/2018 0.1  0.0 - 0.1 K/uL Final  . Immature Granulocytes 12/05/2018 1  % Final  . Abs Immature Granulocytes 12/05/2018 0.14* 0.00 - 0.07 K/uL Final   Performed at [facility], [address] [place], [place] [address]    (this displays the last labs from the last 3 days)  No results found for: TOTALPROTELP, ALBUMINELP, A1GS, A2GS, BETS, BETA2SER, GAMS, MSPIKE, SPEI (this displays SPEP labs)  No results found for: KPAFRELGTCHN, LAMBDASER, KAPLAMBRATIO (kappa/lambda light chains)  No results found for: HGBA, HGBA2QUANT, HGBFQUANT, HGBSQUAN (Hemoglobinopathy evaluation)   No results found for: LDH  No results found for: IRON, TIBC, IRONPCTSAT (Iron and TIBC)  No results found for: FERRITIN  Urinalysis    Component Value Date/Time   COLORURINE YELLOW 07/14/2017 2015   APPEARANCEUR HAZY (A) 07/14/2017 2015   LABSPEC 1.011 07/14/2017 2015   PHURINE 6.0 07/14/2017 2015   [place] 07/14/2017 2015   [place] 07/14/2017 2015   [place] 07/14/2017 2015   [place] 07/14/2017 2015   PROTEINUR NEGATIVE 07/14/2017 2015   UROBILINOGEN 0.2 07/03/2014 0852   NITRITE NEGATIVE 07/14/2017 2015   LEUKOCYTESUR LARGE (A) 07/14/2017 2015     STUDIES:  No results found.   ELIGIBLE FOR AVAILABLE RESEARCH PROTOCOL: no   ASSESSMENT: 73 y.o. [place], [place] woman status post left breast lower inner quadrant biopsy  08/01/2018 for a clinical T1c N0, stage IB invasive ductal carcinoma, grade 2, triple negative, with an MIB-1 of 40%  (1) genetics testing 08/29/2018 through with Hereditary Gene Panel offered by [facility] found no deleterious mutations in APC, ATM, AXIN2, BARD1, BMPR1A, BRCA1, BRCA2, BRIP1, CDH1, CDK4, CDKN2A (p14ARF), CDKN2A (p16INK4a), CHEK2, CTNNA1, DICER1, EPCAM (Deletion/duplication testing only), GREM1 (promoter region deletion/duplication testing only), KIT, MEN1, MLH1, MSH2, MSH3, MSH6, MUTYH, NBN, NF1, NHTL1, PALB2, PDGFRA, PMS2, POLD1, POLE, PTEN, RAD50, RAD51C, RAD51D, SDHB, SDHC, SDHD, SMAD4, SMARCA4. STK11, TP53, TSC1, TSC2, and VHL.  The following genes were evaluated for sequence changes only: SDHA and HOXB13 c.251G>A variant only.   (a) MLH1 c.1890T>G VUS  identified  (2) status post left lumpectomy with sentinel lymph node sampling 09/01/2022 a pT1c pN0, stage IB invasive ductal carcinoma, grade 2, with negative margins.  (a) a total of 3 sentinel lymph nodes were removed  (3) chemotherapy consisting of cyclophosphamide, methotrexate, and fluorouracil (CMF), repeated every 21 days x 8, starting 10/24/2018  (4) adjuvant radiation to be given concurrently with cycles 3-4-5 of chemotherapy   PLAN: Norinne is tolerating CMF with no unusual side effects and she is proceeding to cycle #3 now.  We have omitted methotrexate from this and the next cycle because she is receiving concurrent adjuvant radiation  She will see [place] again in 3 weeks and then again in 6.  At that time likely she will have the methotrexate added back into her chemo  She knows to call for any other issue that may develop before the next visit.  Magrinat, Virgie Dad, MD  12/05/18 1:10 PM Medical Oncology and Hematology [facility] [address] [place], [place] [address] Tel. [phone]    Fax. [phone]    I, Wilburn Mylar, am acting as scribe for Dr. Virgie Dad. Magrinat.  I, Lurline Del MD, have reviewed the above documentation for accuracy and completeness, and I agree with the above.  Addendum: Ms. Saephanh stopped in today (12/06/2018) stating that she is getting tired of being poked and would like a port.  The order has been placed.

## 2018-12-05 ENCOUNTER — Other Ambulatory Visit: Payer: Medicare HMO

## 2018-12-05 ENCOUNTER — Inpatient Hospital Stay: Payer: Medicare HMO | Attending: Oncology

## 2018-12-05 ENCOUNTER — Ambulatory Visit
Admission: RE | Admit: 2018-12-05 | Discharge: 2018-12-05 | Disposition: A | Discharge status: Home / Self Care | Payer: Medicare HMO | Source: Ambulatory Visit | Attending: Radiation Oncology | Admitting: Radiation Oncology

## 2018-12-05 ENCOUNTER — Inpatient Hospital Stay (HOSPITAL_BASED_OUTPATIENT_CLINIC_OR_DEPARTMENT_OTHER): Payer: Medicare HMO | Admitting: Oncology

## 2018-12-05 ENCOUNTER — Ambulatory Visit (HOSPITAL_BASED_OUTPATIENT_CLINIC_OR_DEPARTMENT_OTHER): Payer: Medicare HMO | Admitting: Adult Health

## 2018-12-05 ENCOUNTER — Inpatient Hospital Stay: Payer: Medicare HMO

## 2018-12-05 ENCOUNTER — Encounter: Payer: Self-pay | Admitting: *Deleted

## 2018-12-05 ENCOUNTER — Encounter: Payer: Medicare HMO | Admitting: Adult Health

## 2018-12-05 ENCOUNTER — Other Ambulatory Visit: Payer: Self-pay

## 2018-12-05 VITALS — BP 150/48 | HR 58 | Temp 98.2°F | Resp 18 | Ht 62.0 in | Wt 205.9 lb

## 2018-12-05 DIAGNOSIS — Z171 Estrogen receptor negative status [ER-]: Secondary | ICD-10-CM

## 2018-12-05 DIAGNOSIS — C50512 Malignant neoplasm of lower-outer quadrant of left female breast: Secondary | ICD-10-CM | POA: Insufficient documentation

## 2018-12-05 DIAGNOSIS — I10 Essential (primary) hypertension: Secondary | ICD-10-CM | POA: Diagnosis not present

## 2018-12-05 DIAGNOSIS — Z5111 Encounter for antineoplastic chemotherapy: Secondary | ICD-10-CM | POA: Insufficient documentation

## 2018-12-05 DIAGNOSIS — Z79899 Other long term (current) drug therapy: Secondary | ICD-10-CM | POA: Diagnosis not present

## 2018-12-05 DIAGNOSIS — Z951 Presence of aortocoronary bypass graft: Secondary | ICD-10-CM | POA: Diagnosis not present

## 2018-12-05 DIAGNOSIS — M549 Dorsalgia, unspecified: Secondary | ICD-10-CM | POA: Insufficient documentation

## 2018-12-05 DIAGNOSIS — Z8249 Family history of ischemic heart disease and other diseases of the circulatory system: Secondary | ICD-10-CM | POA: Diagnosis not present

## 2018-12-05 DIAGNOSIS — Z7982 Long term (current) use of aspirin: Secondary | ICD-10-CM

## 2018-12-05 DIAGNOSIS — I2510 Atherosclerotic heart disease of native coronary artery without angina pectoris: Secondary | ICD-10-CM

## 2018-12-05 DIAGNOSIS — Z510 Encounter for antineoplastic radiation therapy: Secondary | ICD-10-CM | POA: Diagnosis not present

## 2018-12-05 DIAGNOSIS — Z803 Family history of malignant neoplasm of breast: Secondary | ICD-10-CM | POA: Insufficient documentation

## 2018-12-05 DIAGNOSIS — C50312 Malignant neoplasm of lower-inner quadrant of left female breast: Secondary | ICD-10-CM

## 2018-12-05 LAB — CBC WITH DIFFERENTIAL/PLATELET
Abs Immature Granulocytes: 0.14 10*3/uL — ABNORMAL HIGH (ref 0.00–0.07)
Basophils Absolute: 0.1 10*3/uL (ref 0.0–0.1)
Basophils Relative: 1 %
Eosinophils Absolute: 0.3 10*3/uL (ref 0.0–0.5)
Eosinophils Relative: 3 %
HCT: 36.5 % (ref 36.0–46.0)
Hemoglobin: 12.8 g/dL (ref 12.0–15.0)
Immature Granulocytes: 1 %
Lymphocytes Relative: 32 %
Lymphs Abs: 3.1 10*3/uL (ref 0.7–4.0)
MCH: 29.7 pg (ref 26.0–34.0)
MCHC: 35.1 g/dL (ref 30.0–36.0)
MCV: 84.7 fL (ref 80.0–100.0)
Monocytes Absolute: 1.4 10*3/uL — ABNORMAL HIGH (ref 0.1–1.0)
Monocytes Relative: 15 %
Neutro Abs: 4.7 10*3/uL (ref 1.7–7.7)
Neutrophils Relative %: 48 %
Platelets: 272 10*3/uL (ref 150–400)
RBC: 4.31 MIL/uL (ref 3.87–5.11)
RDW: 14.6 % (ref 11.5–15.5)
WBC: 9.8 10*3/uL (ref 4.0–10.5)
nRBC: 0 % (ref 0.0–0.2)

## 2018-12-05 LAB — COMPREHENSIVE METABOLIC PANEL
ALT: 17 U/L (ref 0–44)
AST: 22 U/L (ref 15–41)
Albumin: 3.6 g/dL (ref 3.5–5.0)
Alkaline Phosphatase: 80 U/L (ref 38–126)
Anion gap: 11 (ref 5–15)
BUN: 11 mg/dL (ref 8–23)
CO2: 24 mmol/L (ref 22–32)
Calcium: 9 mg/dL (ref 8.9–10.3)
Chloride: 108 mmol/L (ref 98–111)
Creatinine, Ser: 0.91 mg/dL (ref 0.44–1.00)
GFR calc Af Amer: >60 mL/min (ref >60)
GFR calc non Af Amer: >60 mL/min (ref >60)
Glucose, Bld: 93 mg/dL (ref 70–99)
Potassium: 3.7 mmol/L (ref 3.5–5.1)
Sodium: 143 mmol/L (ref 135–145)
Total Bilirubin: 0.8 mg/dL (ref 0.3–1.2)
Total Protein: 7 g/dL (ref 6.5–8.1)

## 2018-12-05 MED ORDER — PALONOSETRON HCL INJECTION 0.25 MG/5ML
0.2500 mg | Freq: Once | INTRAVENOUS | Status: AC
  Administered 2018-12-05: 0.25 mg via INTRAVENOUS

## 2018-12-05 MED ORDER — PALONOSETRON HCL INJECTION 0.25 MG/5ML
INTRAVENOUS | Status: AC
  Filled 2018-12-05: qty 5

## 2018-12-05 MED ORDER — SODIUM CHLORIDE 0.9 % IV SOLN
Freq: Once | INTRAVENOUS | Status: AC
  Administered 2018-12-05: 15:00:00 via INTRAVENOUS
  Filled 2018-12-05: qty 250

## 2018-12-05 MED ORDER — FLUOROURACIL CHEMO INJECTION 2.5 GM/50ML
600.0000 mg/m2 | Freq: Once | INTRAVENOUS | Status: AC
  Administered 2018-12-05: 1200 mg via INTRAVENOUS
  Filled 2018-12-05: qty 24

## 2018-12-05 MED ORDER — DEXAMETHASONE SODIUM PHOSPHATE 10 MG/ML IJ SOLN
10.0000 mg | Freq: Once | INTRAMUSCULAR | Status: AC
  Administered 2018-12-05: 10 mg via INTRAVENOUS

## 2018-12-05 MED ORDER — SODIUM CHLORIDE 0.9 % IV SOLN
600.0000 mg/m2 | Freq: Once | INTRAVENOUS | Status: AC
  Administered 2018-12-05: 1200 mg via INTRAVENOUS
  Filled 2018-12-05: qty 60

## 2018-12-05 MED ORDER — COLD PACK MISC ONCOLOGY
1.0000 | Freq: Once | Status: AC | PRN
  Administered 2018-12-05: 1 via TOPICAL
  Filled 2018-12-05: qty 1

## 2018-12-05 MED ORDER — DEXAMETHASONE SODIUM PHOSPHATE 10 MG/ML IJ SOLN
INTRAMUSCULAR | Status: AC
  Filled 2018-12-05: qty 1

## 2018-12-05 NOTE — Patient Instructions (Signed)
  [facility] Discharge Instructions for Patients Receiving Chemotherapy  Today you received the following chemotherapy agents Cytoxin; 5 FU  To help prevent nausea and vomiting after your treatment, we encourage you to take your nausea medication as directed  If you develop nausea and vomiting that is not controlled by your nausea medication, call the clinic.   BELOW ARE SYMPTOMS THAT SHOULD BE REPORTED IMMEDIATELY:  *FEVER GREATER THAN 100.5 F  *CHILLS WITH OR WITHOUT FEVER  NAUSEA AND VOMITING THAT IS NOT CONTROLLED WITH YOUR NAUSEA MEDICATION  *UNUSUAL SHORTNESS OF BREATH  *UNUSUAL BRUISING OR BLEEDING  TENDERNESS IN MOUTH AND THROAT WITH OR WITHOUT PRESENCE OF ULCERS  *URINARY PROBLEMS  *BOWEL PROBLEMS  UNUSUAL RASH Items with * indicate a potential emergency and should be followed up as soon as possible.  Feel free to call the clinic should you have any questions or concerns. The clinic phone number is [phone].  Please show the [place] at check-in to the Emergency Department and triage nurse.

## 2018-12-05 NOTE — Progress Notes (Signed)
After cytoxin infusion, the patient's arm was noted to be mildly swollen and the patient expressed tenderness when the nurse flushed saline.  Pharmacy was notified and the extravasation protocol was put in place. Dr. Jana Hakim was also notified and Ria Comment came over and assessed the patient and noted it was a  suspected extravasation and that it possibly happened during the flush. The patient will return the next 2 days and then one week for Dr. Jana Hakim to assess the arm.  Icing instructions were given and the patient verbalized understanding.

## 2018-12-06 ENCOUNTER — Other Ambulatory Visit: Payer: Self-pay

## 2018-12-06 ENCOUNTER — Ambulatory Visit
Admission: RE | Admit: 2018-12-06 | Discharge: 2018-12-06 | Disposition: A | Discharge status: Home / Self Care | Payer: Medicare HMO | Source: Ambulatory Visit | Attending: Radiation Oncology | Admitting: Radiation Oncology

## 2018-12-06 ENCOUNTER — Telehealth: Payer: Self-pay | Admitting: *Deleted

## 2018-12-06 DIAGNOSIS — Z171 Estrogen receptor negative status [ER-]: Secondary | ICD-10-CM | POA: Diagnosis not present

## 2018-12-06 DIAGNOSIS — C50512 Malignant neoplasm of lower-outer quadrant of left female breast: Secondary | ICD-10-CM | POA: Diagnosis not present

## 2018-12-06 DIAGNOSIS — Z510 Encounter for antineoplastic radiation therapy: Secondary | ICD-10-CM | POA: Diagnosis not present

## 2018-12-06 MED ORDER — METOPROLOL TARTRATE 25 MG PO TABS: 25.0000 mg | ORAL_TABLET | Freq: Two times a day (BID) | ORAL | 3 refills | Status: DC

## 2018-12-06 NOTE — Progress Notes (Signed)
S: Called to evaluate patient by RN.  Brandyn is a 73 year old woman with triple negative breast cancer undergoing CMF (methotrexate held this cycle due to radiation).  Called due to possible Cyclophosphamide extravasation.  Cyclophosphamide is classified as irritant.  RN notes that at time of Cyclophosphamide completion she was unable to get blood return from patient IV, however during and prior there was blood return. Patient denies any pain or discomfort at site.  RN reports site with mild swelling and no blood return possible from IV.    O: Patient is doing well, in no apparent distress, sitting up in her chair.  There is an ice pack over the IV site.  Mild swelling of about 3cm in diameter around IV, no erythema, no tenderness, no warmth.    A: Questionable Cyclophosphamide extravasation.    P: Patient to follow nursing instructions for extravasation given by RN which include icing the area 4 times a day.  She will return in 24, 48 hours per protocol, and in 1 week.  Patient is getting radiation, so she will stop by our office after her treatments. She knows to call for any worsening of the area.  RN removed patient IV without difficulty, and patient tolerated well.

## 2018-12-06 NOTE — Addendum Note (Signed)
Addended by: Chauncey Cruel on: 12/06/2018 09:03 AM   Modules accepted: Orders

## 2018-12-06 NOTE — Telephone Encounter (Signed)
Pt was to see nurse post radiation visit today for follow up evaluation of IV site.  This RN attempted to call pt at approximately 12 noon due to noted radiation appointment was at [place]- and this RN was had not been informed that pt asked to be seen prior to nurse arrival.   Per above call was informed pt was in [place] " seeing her doctor ".  This RN called pt on cell number and left message for a return call.  This RN then called home number again later in the day and spoke with the patient.  [name] states area " looks better and is feeling ok ".  This RN informed pt need for nurse evaluation for appropriate further recommendations - plan per need is pt will come in to treatment area in am post radiation for a nurse to assess and document for appropriate care.  Treatment room nurse notified of above.

## 2018-12-06 NOTE — Telephone Encounter (Signed)
Refill sent.

## 2018-12-07 ENCOUNTER — Ambulatory Visit
Admission: RE | Admit: 2018-12-07 | Discharge: 2018-12-07 | Disposition: A | Discharge status: Home / Self Care | Payer: Medicare HMO | Source: Ambulatory Visit | Attending: Radiation Oncology | Admitting: Radiation Oncology

## 2018-12-07 ENCOUNTER — Other Ambulatory Visit: Payer: Self-pay

## 2018-12-07 ENCOUNTER — Other Ambulatory Visit: Payer: Self-pay | Admitting: *Deleted

## 2018-12-07 ENCOUNTER — Encounter: Payer: Self-pay | Admitting: Oncology

## 2018-12-07 DIAGNOSIS — C50512 Malignant neoplasm of lower-outer quadrant of left female breast: Secondary | ICD-10-CM | POA: Diagnosis not present

## 2018-12-07 DIAGNOSIS — Z510 Encounter for antineoplastic radiation therapy: Secondary | ICD-10-CM | POA: Diagnosis not present

## 2018-12-07 DIAGNOSIS — Z171 Estrogen receptor negative status [ER-]: Secondary | ICD-10-CM | POA: Diagnosis not present

## 2018-12-07 NOTE — Progress Notes (Signed)
Pt is approved for the $1000 [employer] grant.  

## 2018-12-08 ENCOUNTER — Other Ambulatory Visit: Payer: Self-pay

## 2018-12-08 ENCOUNTER — Ambulatory Visit
Admission: RE | Admit: 2018-12-08 | Discharge: 2018-12-08 | Disposition: A | Discharge status: Home / Self Care | Payer: Medicare HMO | Source: Ambulatory Visit | Attending: Radiation Oncology | Admitting: Radiation Oncology

## 2018-12-08 ENCOUNTER — Telehealth: Payer: Self-pay | Admitting: Oncology

## 2018-12-08 DIAGNOSIS — C50512 Malignant neoplasm of lower-outer quadrant of left female breast: Secondary | ICD-10-CM | POA: Diagnosis not present

## 2018-12-08 DIAGNOSIS — Z510 Encounter for antineoplastic radiation therapy: Secondary | ICD-10-CM | POA: Diagnosis not present

## 2018-12-08 DIAGNOSIS — Z171 Estrogen receptor negative status [ER-]: Secondary | ICD-10-CM | POA: Diagnosis not present

## 2018-12-08 NOTE — Telephone Encounter (Signed)
I could not reach patient will mail

## 2018-12-11 ENCOUNTER — Ambulatory Visit
Admission: RE | Admit: 2018-12-11 | Discharge: 2018-12-11 | Disposition: A | Discharge status: Home / Self Care | Payer: Medicare HMO | Source: Ambulatory Visit | Attending: Radiation Oncology | Admitting: Radiation Oncology

## 2018-12-11 ENCOUNTER — Other Ambulatory Visit: Payer: Self-pay

## 2018-12-11 ENCOUNTER — Ambulatory Visit: Payer: Self-pay | Admitting: General Surgery

## 2018-12-11 DIAGNOSIS — Z171 Estrogen receptor negative status [ER-]: Secondary | ICD-10-CM | POA: Diagnosis not present

## 2018-12-11 DIAGNOSIS — C50512 Malignant neoplasm of lower-outer quadrant of left female breast: Secondary | ICD-10-CM | POA: Diagnosis not present

## 2018-12-11 DIAGNOSIS — Z510 Encounter for antineoplastic radiation therapy: Secondary | ICD-10-CM | POA: Diagnosis not present

## 2018-12-12 ENCOUNTER — Ambulatory Visit
Admission: RE | Admit: 2018-12-12 | Discharge: 2018-12-12 | Disposition: A | Discharge status: Home / Self Care | Payer: Medicare HMO | Source: Ambulatory Visit | Attending: Radiation Oncology | Admitting: Radiation Oncology

## 2018-12-12 ENCOUNTER — Other Ambulatory Visit: Payer: Self-pay

## 2018-12-12 ENCOUNTER — Telehealth: Payer: Self-pay | Admitting: Emergency Medicine

## 2018-12-12 DIAGNOSIS — C50512 Malignant neoplasm of lower-outer quadrant of left female breast: Secondary | ICD-10-CM | POA: Diagnosis not present

## 2018-12-12 DIAGNOSIS — Z171 Estrogen receptor negative status [ER-]: Secondary | ICD-10-CM | POA: Diagnosis not present

## 2018-12-12 DIAGNOSIS — Z510 Encounter for antineoplastic radiation therapy: Secondary | ICD-10-CM | POA: Diagnosis not present

## 2018-12-12 MED ORDER — METOPROLOL TARTRATE 25 MG PO TABS: 25.0000 mg | ORAL_TABLET | Freq: Two times a day (BID) | ORAL | 3 refills | Status: DC

## 2018-12-12 NOTE — Telephone Encounter (Signed)
Refilled metoprolol to [facility]

## 2018-12-12 NOTE — Telephone Encounter (Signed)
Pt presents to [facility] for 1 wk extravasation recheck on R forearm from Cytoxan.  No edema, redness, discoloration, or drainage present.  Pt denies any pain, tenderness, or other symptoms/concerns.  Spoke to PA [place] over the phone with assessment who approved pt for discharge today.  MD Magrinat aware.  Pt verbalized understanding of instructions to f/u as needed.  A&Ox4.  Ambulatory w/steady gait to exit with belongings.

## 2018-12-13 ENCOUNTER — Other Ambulatory Visit: Payer: Self-pay

## 2018-12-13 ENCOUNTER — Ambulatory Visit
Admission: RE | Admit: 2018-12-13 | Discharge: 2018-12-13 | Disposition: A | Discharge status: Home / Self Care | Payer: Medicare HMO | Source: Ambulatory Visit | Attending: Radiation Oncology | Admitting: Radiation Oncology

## 2018-12-13 DIAGNOSIS — Z510 Encounter for antineoplastic radiation therapy: Secondary | ICD-10-CM | POA: Diagnosis not present

## 2018-12-13 DIAGNOSIS — C50512 Malignant neoplasm of lower-outer quadrant of left female breast: Secondary | ICD-10-CM | POA: Diagnosis not present

## 2018-12-13 DIAGNOSIS — Z171 Estrogen receptor negative status [ER-]: Secondary | ICD-10-CM | POA: Diagnosis not present

## 2018-12-14 ENCOUNTER — Other Ambulatory Visit: Payer: Self-pay

## 2018-12-14 ENCOUNTER — Ambulatory Visit
Admission: RE | Admit: 2018-12-14 | Discharge: 2018-12-14 | Disposition: A | Discharge status: Home / Self Care | Payer: Medicare HMO | Source: Ambulatory Visit | Attending: Radiation Oncology | Admitting: Radiation Oncology

## 2018-12-14 DIAGNOSIS — Z510 Encounter for antineoplastic radiation therapy: Secondary | ICD-10-CM | POA: Diagnosis not present

## 2018-12-14 DIAGNOSIS — Z171 Estrogen receptor negative status [ER-]: Secondary | ICD-10-CM | POA: Diagnosis not present

## 2018-12-14 DIAGNOSIS — C50512 Malignant neoplasm of lower-outer quadrant of left female breast: Secondary | ICD-10-CM | POA: Diagnosis not present

## 2018-12-15 ENCOUNTER — Other Ambulatory Visit: Payer: Self-pay

## 2018-12-15 ENCOUNTER — Other Ambulatory Visit: Payer: Self-pay | Admitting: Physician Assistant

## 2018-12-15 ENCOUNTER — Ambulatory Visit
Admission: RE | Admit: 2018-12-15 | Discharge: 2018-12-15 | Disposition: A | Discharge status: Home / Self Care | Payer: Medicare HMO | Source: Ambulatory Visit | Attending: Radiation Oncology | Admitting: Radiation Oncology

## 2018-12-15 DIAGNOSIS — C50512 Malignant neoplasm of lower-outer quadrant of left female breast: Secondary | ICD-10-CM | POA: Diagnosis not present

## 2018-12-15 DIAGNOSIS — Z510 Encounter for antineoplastic radiation therapy: Secondary | ICD-10-CM | POA: Diagnosis not present

## 2018-12-15 DIAGNOSIS — Z171 Estrogen receptor negative status [ER-]: Secondary | ICD-10-CM | POA: Diagnosis not present

## 2018-12-17 DIAGNOSIS — Z510 Encounter for antineoplastic radiation therapy: Secondary | ICD-10-CM | POA: Diagnosis not present

## 2018-12-17 DIAGNOSIS — Z171 Estrogen receptor negative status [ER-]: Secondary | ICD-10-CM | POA: Diagnosis not present

## 2018-12-17 DIAGNOSIS — C50512 Malignant neoplasm of lower-outer quadrant of left female breast: Secondary | ICD-10-CM | POA: Diagnosis not present

## 2018-12-18 ENCOUNTER — Ambulatory Visit
Admission: RE | Admit: 2018-12-18 | Discharge: 2018-12-18 | Disposition: A | Discharge status: Home / Self Care | Payer: Medicare HMO | Source: Ambulatory Visit | Attending: Radiation Oncology | Admitting: Radiation Oncology

## 2018-12-18 ENCOUNTER — Other Ambulatory Visit: Payer: Self-pay

## 2018-12-18 ENCOUNTER — Other Ambulatory Visit: Payer: Self-pay | Admitting: Radiology

## 2018-12-18 DIAGNOSIS — Z171 Estrogen receptor negative status [ER-]: Secondary | ICD-10-CM | POA: Diagnosis not present

## 2018-12-18 DIAGNOSIS — C50512 Malignant neoplasm of lower-outer quadrant of left female breast: Secondary | ICD-10-CM | POA: Diagnosis not present

## 2018-12-18 DIAGNOSIS — Z510 Encounter for antineoplastic radiation therapy: Secondary | ICD-10-CM | POA: Diagnosis not present

## 2018-12-19 ENCOUNTER — Ambulatory Visit
Admission: RE | Admit: 2018-12-19 | Discharge: 2018-12-19 | Disposition: A | Discharge status: Home / Self Care | Payer: Medicare HMO | Source: Ambulatory Visit | Attending: Radiation Oncology | Admitting: Radiation Oncology

## 2018-12-19 ENCOUNTER — Inpatient Hospital Stay (HOSPITAL_COMMUNITY): Admission: RE | Admit: 2018-12-19 | Payer: Medicare HMO | Source: Ambulatory Visit

## 2018-12-19 ENCOUNTER — Ambulatory Visit (HOSPITAL_COMMUNITY): Payer: Medicare HMO

## 2018-12-19 ENCOUNTER — Other Ambulatory Visit: Payer: Self-pay

## 2018-12-19 DIAGNOSIS — C50512 Malignant neoplasm of lower-outer quadrant of left female breast: Secondary | ICD-10-CM | POA: Diagnosis not present

## 2018-12-19 DIAGNOSIS — Z171 Estrogen receptor negative status [ER-]: Secondary | ICD-10-CM | POA: Diagnosis not present

## 2018-12-19 DIAGNOSIS — Z510 Encounter for antineoplastic radiation therapy: Secondary | ICD-10-CM | POA: Diagnosis not present

## 2018-12-20 ENCOUNTER — Other Ambulatory Visit: Payer: Self-pay

## 2018-12-20 ENCOUNTER — Ambulatory Visit
Admission: RE | Admit: 2018-12-20 | Discharge: 2018-12-20 | Disposition: A | Discharge status: Home / Self Care | Payer: Medicare HMO | Source: Ambulatory Visit | Attending: Radiation Oncology | Admitting: Radiation Oncology

## 2018-12-20 DIAGNOSIS — Z171 Estrogen receptor negative status [ER-]: Secondary | ICD-10-CM | POA: Diagnosis not present

## 2018-12-20 DIAGNOSIS — Z510 Encounter for antineoplastic radiation therapy: Secondary | ICD-10-CM | POA: Insufficient documentation

## 2018-12-20 DIAGNOSIS — C50512 Malignant neoplasm of lower-outer quadrant of left female breast: Secondary | ICD-10-CM | POA: Insufficient documentation

## 2018-12-21 ENCOUNTER — Other Ambulatory Visit: Payer: Self-pay

## 2018-12-21 ENCOUNTER — Ambulatory Visit
Admission: RE | Admit: 2018-12-21 | Discharge: 2018-12-21 | Disposition: A | Discharge status: Home / Self Care | Payer: Medicare HMO | Source: Ambulatory Visit | Attending: Radiation Oncology | Admitting: Radiation Oncology

## 2018-12-21 DIAGNOSIS — C50512 Malignant neoplasm of lower-outer quadrant of left female breast: Secondary | ICD-10-CM | POA: Diagnosis not present

## 2018-12-21 DIAGNOSIS — Z171 Estrogen receptor negative status [ER-]: Secondary | ICD-10-CM | POA: Diagnosis not present

## 2018-12-21 DIAGNOSIS — Z510 Encounter for antineoplastic radiation therapy: Secondary | ICD-10-CM | POA: Diagnosis not present

## 2018-12-25 ENCOUNTER — Ambulatory Visit
Admission: RE | Admit: 2018-12-25 | Discharge: 2018-12-25 | Disposition: A | Discharge status: Home / Self Care | Payer: Medicare HMO | Source: Ambulatory Visit | Attending: Radiation Oncology | Admitting: Radiation Oncology

## 2018-12-25 ENCOUNTER — Other Ambulatory Visit: Payer: Self-pay

## 2018-12-25 DIAGNOSIS — Z510 Encounter for antineoplastic radiation therapy: Secondary | ICD-10-CM | POA: Diagnosis not present

## 2018-12-25 DIAGNOSIS — Z171 Estrogen receptor negative status [ER-]: Secondary | ICD-10-CM | POA: Diagnosis not present

## 2018-12-25 DIAGNOSIS — C50512 Malignant neoplasm of lower-outer quadrant of left female breast: Secondary | ICD-10-CM | POA: Diagnosis not present

## 2018-12-26 ENCOUNTER — Other Ambulatory Visit: Payer: Medicare HMO

## 2018-12-26 ENCOUNTER — Ambulatory Visit
Admission: RE | Admit: 2018-12-26 | Discharge: 2018-12-26 | Disposition: A | Discharge status: Home / Self Care | Payer: Medicare HMO | Source: Ambulatory Visit | Attending: Radiation Oncology | Admitting: Radiation Oncology

## 2018-12-26 ENCOUNTER — Inpatient Hospital Stay: Payer: Medicare HMO

## 2018-12-26 ENCOUNTER — Inpatient Hospital Stay (HOSPITAL_BASED_OUTPATIENT_CLINIC_OR_DEPARTMENT_OTHER): Payer: Medicare HMO | Admitting: Adult Health

## 2018-12-26 ENCOUNTER — Inpatient Hospital Stay: Payer: Medicare HMO | Attending: Genetic Counselor

## 2018-12-26 ENCOUNTER — Other Ambulatory Visit: Payer: Self-pay | Admitting: *Deleted

## 2018-12-26 ENCOUNTER — Other Ambulatory Visit: Payer: Self-pay

## 2018-12-26 ENCOUNTER — Encounter: Payer: Self-pay | Admitting: *Deleted

## 2018-12-26 ENCOUNTER — Encounter: Payer: Self-pay | Admitting: Adult Health

## 2018-12-26 VITALS — BP 159/67 | HR 63 | Temp 98.2°F | Resp 18 | Ht 62.0 in | Wt 207.0 lb

## 2018-12-26 DIAGNOSIS — Z951 Presence of aortocoronary bypass graft: Secondary | ICD-10-CM | POA: Insufficient documentation

## 2018-12-26 DIAGNOSIS — Z79899 Other long term (current) drug therapy: Secondary | ICD-10-CM | POA: Insufficient documentation

## 2018-12-26 DIAGNOSIS — R5383 Other fatigue: Secondary | ICD-10-CM | POA: Diagnosis not present

## 2018-12-26 DIAGNOSIS — G8929 Other chronic pain: Secondary | ICD-10-CM | POA: Insufficient documentation

## 2018-12-26 DIAGNOSIS — Z171 Estrogen receptor negative status [ER-]: Secondary | ICD-10-CM | POA: Diagnosis not present

## 2018-12-26 DIAGNOSIS — Z7982 Long term (current) use of aspirin: Secondary | ICD-10-CM | POA: Insufficient documentation

## 2018-12-26 DIAGNOSIS — I10 Essential (primary) hypertension: Secondary | ICD-10-CM | POA: Diagnosis not present

## 2018-12-26 DIAGNOSIS — C50512 Malignant neoplasm of lower-outer quadrant of left female breast: Secondary | ICD-10-CM

## 2018-12-26 DIAGNOSIS — Z8249 Family history of ischemic heart disease and other diseases of the circulatory system: Secondary | ICD-10-CM | POA: Diagnosis not present

## 2018-12-26 DIAGNOSIS — Z510 Encounter for antineoplastic radiation therapy: Secondary | ICD-10-CM | POA: Diagnosis not present

## 2018-12-26 DIAGNOSIS — Z791 Long term (current) use of non-steroidal anti-inflammatories (NSAID): Secondary | ICD-10-CM | POA: Diagnosis not present

## 2018-12-26 DIAGNOSIS — Z5111 Encounter for antineoplastic chemotherapy: Secondary | ICD-10-CM | POA: Insufficient documentation

## 2018-12-26 DIAGNOSIS — Z803 Family history of malignant neoplasm of breast: Secondary | ICD-10-CM | POA: Diagnosis not present

## 2018-12-26 LAB — CBC WITH DIFFERENTIAL/PLATELET
Abs Immature Granulocytes: 0.06 10*3/uL (ref 0.00–0.07)
Basophils Absolute: 0.1 10*3/uL (ref 0.0–0.1)
Basophils Relative: 1 %
Eosinophils Absolute: 0.3 10*3/uL (ref 0.0–0.5)
Eosinophils Relative: 5 %
HCT: 35.9 % — ABNORMAL LOW (ref 36.0–46.0)
Hemoglobin: 12.7 g/dL (ref 12.0–15.0)
Immature Granulocytes: 1 %
Lymphocytes Relative: 28 %
Lymphs Abs: 2.1 10*3/uL (ref 0.7–4.0)
MCH: 30 pg (ref 26.0–34.0)
MCHC: 35.4 g/dL (ref 30.0–36.0)
MCV: 84.7 fL (ref 80.0–100.0)
Monocytes Absolute: 1.2 10*3/uL — ABNORMAL HIGH (ref 0.1–1.0)
Monocytes Relative: 16 %
Neutro Abs: 3.7 10*3/uL (ref 1.7–7.7)
Neutrophils Relative %: 49 %
Platelets: 230 10*3/uL (ref 150–400)
RBC: 4.24 MIL/uL (ref 3.87–5.11)
RDW: 15.1 % (ref 11.5–15.5)
WBC: 7.5 10*3/uL (ref 4.0–10.5)
nRBC: 0 % (ref 0.0–0.2)

## 2018-12-26 LAB — COMPREHENSIVE METABOLIC PANEL
ALT: 12 U/L (ref 0–44)
AST: 18 U/L (ref 15–41)
Albumin: 3.6 g/dL (ref 3.5–5.0)
Alkaline Phosphatase: 77 U/L (ref 38–126)
Anion gap: 8 (ref 5–15)
BUN: 7 mg/dL — ABNORMAL LOW (ref 8–23)
CO2: 27 mmol/L (ref 22–32)
Calcium: 9 mg/dL (ref 8.9–10.3)
Chloride: 108 mmol/L (ref 98–111)
Creatinine, Ser: 0.89 mg/dL (ref 0.44–1.00)
GFR calc Af Amer: >60 mL/min (ref >60)
GFR calc non Af Amer: >60 mL/min (ref >60)
Glucose, Bld: 98 mg/dL (ref 70–99)
Potassium: 3.8 mmol/L (ref 3.5–5.1)
Sodium: 143 mmol/L (ref 135–145)
Total Bilirubin: 0.7 mg/dL (ref 0.3–1.2)
Total Protein: 7.3 g/dL (ref 6.5–8.1)

## 2018-12-26 MED ORDER — DEXAMETHASONE SODIUM PHOSPHATE 10 MG/ML IJ SOLN
INTRAMUSCULAR | Status: AC
  Filled 2018-12-26: qty 1

## 2018-12-26 MED ORDER — FLUOROURACIL CHEMO INJECTION 2.5 GM/50ML
600.0000 mg/m2 | Freq: Once | INTRAVENOUS | Status: AC
  Administered 2018-12-26: 1200 mg via INTRAVENOUS
  Filled 2018-12-26: qty 24

## 2018-12-26 MED ORDER — LIDOCAINE-PRILOCAINE 2.5-2.5 % EX CREA: 1.0000 "application " | TOPICAL_CREAM | CUTANEOUS | 0 refills | Status: DC | PRN

## 2018-12-26 MED ORDER — PALONOSETRON HCL INJECTION 0.25 MG/5ML
0.2500 mg | Freq: Once | INTRAVENOUS | Status: AC
  Administered 2018-12-26: 0.25 mg via INTRAVENOUS

## 2018-12-26 MED ORDER — SODIUM CHLORIDE 0.9 % IV SOLN
Freq: Once | INTRAVENOUS | Status: AC
  Administered 2018-12-26: 14:00:00 via INTRAVENOUS
  Filled 2018-12-26: qty 250

## 2018-12-26 MED ORDER — PALONOSETRON HCL INJECTION 0.25 MG/5ML
INTRAVENOUS | Status: AC
  Filled 2018-12-26: qty 5

## 2018-12-26 MED ORDER — SODIUM CHLORIDE 0.9 % IV SOLN
600.0000 mg/m2 | Freq: Once | INTRAVENOUS | Status: AC
  Administered 2018-12-26: 15:00:00 1200 mg via INTRAVENOUS
  Filled 2018-12-26: qty 60

## 2018-12-26 MED ORDER — DEXAMETHASONE SODIUM PHOSPHATE 10 MG/ML IJ SOLN
10.0000 mg | Freq: Once | INTRAMUSCULAR | Status: AC
  Administered 2018-12-26: 14:00:00 10 mg via INTRAVENOUS

## 2018-12-26 MED FILL — LIDOCAINE-PRILOCAINE CREAM: 2.5-2.5 | 10 days supply | Qty: 30 | Fill #0

## 2018-12-26 NOTE — Progress Notes (Signed)
[facility] [place] [facility]  [phone] [phone]    ID: Gloria Mccoy DOB: 29-Nov-1945  MR#: [id number]  IOX#:[id number]  Patient Care Team: Redmond School, MD as PCP - General (Internal Medicine) Harl Bowie, Alphonse Guild, MD as PCP - Cardiology (Cardiology) Magrinat, Virgie Dad, MD as Consulting Physician (Oncology) Kyung Rudd, MD as Consulting Physician (Radiation Oncology) Rutherford Guys, MD as Consulting Physician (Ophthalmology) Jovita Kussmaul, MD as Consulting Physician (General Surgery) Mauro Kaufmann, RN as Oncology Nurse Navigator Rockwell Germany, RN as Oncology Nurse Navigator OTHER MD:    CHIEF COMPLAINT: Triple negative breast cancer  CURRENT TREATMENT: Adjuvant chemotherapy, adjuvant radiation   INTERVAL HISTORY: Gloria Mccoy was seen today for follow-up and treatment of her triple negative breast cancer.   She continues on adjuvant chemotherapy consisting of cyclophosphamide, methotrexate, and fluorouracil (CMF), repeated every 21 days x 8. Methotrexate will be held for the next 3 cycles as she undergoes radiation treatments. Today is day 1 cycle 4.     REVIEW OF SYSTEMS: Gloria Mccoy is doing well today.  She is undergoing radiation daily.  She says her breast feels like a sunburn.  She notes that she was supposed to have port placed, however she forgot to stop taking her blood thinners.  This was rescheduled to Friday.    Gloria Mccoy isn't sure if she is taking her lasix.  She is having some more lower extremity swelling.  She has no chest pain, cough or shortness of breath.  Gloria Mccoy has some mild fatigue.  She deals with this by taking brief naps when needed.    Gloria Mccoy denies fever, chills, nausea, vomiting, bowel/bladder changes, or any other concerns.  A detailed ROS was otherwise non contributory.  She is keeping appropriate pandemic precautions.  HISTORY OF CURRENT ILLNESS: From the original intake note:  Gloria Mccoy had routine screening  mammography on 07/12/2018 showing a possible abnormality in the left breast. She underwent unilateral left diagnostic mammography with tomography and left breast ultrasonography at [facility] on 07/25/2018 showing: Breast Density Category B. There is a mass with indistinct margins in the lower inner posterior left breast measuring approximately 0.6 cm. Targeted ultrasound of the lower left breast was performed. There is a hypoechoic mass with margin irregularity in the left breast at 7 o'clock, 7 cm from the nipple measuring 0.7 x 0.5 x 0.6 cm. No lymphadenopathy seen in the left axilla.   Accordingly on 08/01/2018 she proceeded to biopsy of the left breast area in question. The pathology from this procedure showed ([id number]): invasive ductal carcinoma, grade II. Prognostic indicators significant for: estrogen receptor, 0% negative and progesterone receptor, 0% negative. Proliferation marker Ki67 at 40%. HER2 negative (1+) by immunohistochemistry.   The patient's subsequent history is as detailed below.   PAST MEDICAL HISTORY: Past Medical History:  Diagnosis Date  . Arthritis    "hands sometimes" (07/13/2017)  . Coronary artery disease    a. s/p CABG x3 in 06/2017 with LIMA-LAD, SVG-D1, and SVG-RI.  b. 10/2017: cath showing a widely patent LIMA-LAD with ostial occlusion of the SVG-RI and subtotally occluded atretic SVG-D1. Graft occlusion thought to be 2ry to improvement in pre-CABG stenoses.   . Essential hypertension   . Family history of adverse reaction to anesthesia    sister had a complication that was stated she had a "foggy" episode after her breast surgery was readmitted 1 day post op after being discharged from the hospital. Sister also has significant lung problems that could have  contributed to this   . Family history of breast cancer   . Family history of colon cancer   . Hypothyroid   . Meniere disease   . Myocardial infarction ([place]) 06/2017   during cardaic rehab  .  Obesity   . Osteopenia 01/2012   T score -1.3 FRAX 7.9%/0.6%     PAST SURGICAL HISTORY: Past Surgical History:  Procedure Laterality Date  . BREAST LUMPECTOMY WITH RADIOACTIVE SEED AND SENTINEL LYMPH NODE BIOPSY Left 09/01/2018   Procedure: LEFT BREAST LUMPECTOMY WITH RADIOACTIVE SEED AND SENTINEL LYMPH NODE BIOPSY;  Surgeon: Jovita Kussmaul, MD;  Location: [facility];  Service: General;  Laterality: Left;  . BREAST SURGERY    . CARDIAC CATHETERIZATION  07/13/2017  . CATARACT EXTRACTION W/PHACO Right 01/28/2015   Procedure: CATARACT EXTRACTION PHACO AND INTRAOCULAR LENS PLACEMENT :  CDE:  5.70;  Surgeon: Rutherford Guys, MD;  Location: [facility];  Service: Ophthalmology;  Laterality: Right;  . CATARACT EXTRACTION W/PHACO Left 02/11/2015   Procedure: CATARACT EXTRACTION PHACO AND INTRAOCULAR LENS PLACEMENT (IOC);  Surgeon: Rutherford Guys, MD;  Location: [facility];  Service: Ophthalmology;  Laterality: Left;  CDE: 7.38  . COLONOSCOPY N/A 09/26/2012   Procedure: COLONOSCOPY;  Surgeon: Jamesetta So, MD;  [facility];  Service: Gastroenterology;  Laterality: N/A;  . CORONARY ARTERY BYPASS GRAFT N/A 07/15/2017   Procedure: CORONARY ARTERY BYPASS GRAFTING (CABG) X 3 USING LEFT INTERNAL MAMMARY ARTERY AND RIGHT SAPHENOUS VEIN- ENDOSCOPICALLY HARVESTED;  Surgeon: Melrose Nakayama, MD;  Location: [facility];  Service: Open Heart Surgery;  Laterality: N/A;  . LEFT HEART CATH AND CORONARY ANGIOGRAPHY N/A 07/13/2017   Procedure: LEFT HEART CATH AND CORONARY ANGIOGRAPHY;  Surgeon: Martinique, Peter M, MD;  Location: [facility];  Service: Cardiovascular;  Laterality: N/A;  . LEFT HEART CATH AND CORS/GRAFTS ANGIOGRAPHY N/A 11/03/2017   Procedure: LEFT HEART CATH AND CORS/GRAFTS ANGIOGRAPHY;  Surgeon: [place] Sine, MD;  Location: [facility];  Service: Cardiovascular;  Laterality: N/A;  . TEE WITHOUT CARDIOVERSION N/A 07/15/2017   Procedure: TRANSESOPHAGEAL ECHOCARDIOGRAM (TEE);  Surgeon: Melrose Nakayama, MD;  Location: [facility];  Service: Open Heart Surgery;  Laterality: N/A;  . TUBAL LIGATION    . TYMPANOPLASTY Left    fluid from ear drum     FAMILY HISTORY: Family History  Problem Relation Age of Onset  . Diabetes Sister        AODM  . Heart disease Sister 78       CABG  . Breast cancer Sister   . Heart disease Brother 74       In his 67s  . Colon cancer Maternal Uncle 28  . Diabetes Sister        AODM  . Hypertension Daughter   . Hypertension Daughter   . Breast cancer Cousin        PATERNAL COUSIN  . Heart attack Father        In his 35s  . Stroke Mother   . Stroke Maternal Grandmother   . Diabetes Maternal Grandfather        d. 109  . Heart attack Paternal Grandmother   . Colon cancer Maternal Uncle 10  . Breast cancer Cousin        dx in her 64s; mat first cousin  . Breast cancer Cousin        dx 108s; d. 23s  . Breast cancer Niece 65   Gloria Mccoy's father died from a  myocardial infarction at age 67. Patients' mother died from a stroke at age 67. The patient has 3 brothers and 9 sisters. One of Gloria Mccoy's sisters, Gloria Mccoy, was diagnosed with breast cancer at the age of 73. Estalee also has a niece that was diagnosed with breast cancer at 37, and two cousins that were diagnosed with breast cancer.  She believes 1 of her brothers may have prostate cancer.  Patient denies anyone in her family having ovarian or pancreatic cancer. Gloria Mccoy has an uncle that was diagnosed with colon cancer and an uncle that was diagnosed with "stomach" cancer.    GYNECOLOGIC HISTORY:  No LMP recorded. Patient is postmenopausal. 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 Her husband, Gloria Mccoy, is a retired Administrator. They have two daughters. Their daughter, Gloria Mccoy, lives in [place] and is a housewife.  Their other  daughter works at [employer]. Gloria Mccoy had one grandchild that is now deceased. She attends a [employer].    ADVANCED DIRECTIVES: Gloria Mccoy's husband, Gloria Mccoy, is automatically her healthcare power of attorney.     HEALTH MAINTENANCE: Social History   Tobacco Use  . Smoking status: Never Smoker  . Smokeless tobacco: Former Systems developer    Types: Snuff  Substance Use Topics  . Alcohol use: Yes    Alcohol/week: 1.0 standard drinks    Types: 1 Standard drinks or equivalent per week    Comment: occasionally  . Drug use: No    Colonoscopy: yes, Dr. Arnoldo Morale  PAP:   Bone density: yes, 2016, -1.1 osteopenic   Allergies  Allergen Reactions  . Sulfa Antibiotics Shortness Of Breath  . Sulfasalazine Shortness Of Breath    Current Outpatient Medications  Medication Sig Dispense Refill  . acetaminophen (TYLENOL) 325 MG tablet Take 2 tablets (650 mg total) by mouth every 6 (six) hours as needed for mild pain.    Marland Kitchen aspirin EC 81 MG tablet Take 81 mg by mouth daily.    Marland Kitchen atorvastatin (LIPITOR) 80 MG tablet Take 1 tablet (80 mg total) by mouth daily at 6 PM. 90 tablet 3  . Cholecalciferol (VITAMIN D PO) Take 1 tablet by mouth daily.     . clopidogrel (PLAVIX) 75 MG tablet Take 1 tablet by mouth once daily 90 tablet 3  . Cyanocobalamin (VITAMIN B-12 PO) Take 1 tablet by mouth daily.    Marland Kitchen dexamethasone (DECADRON) 4 MG tablet Take 1 tablet (4 mg total) by mouth daily. Start the day after chemotherapy for 2 days. Take with food. 30 tablet 1  . fish oil-omega-3 fatty acids 1000 MG capsule Take 1 g by mouth daily.    . furosemide (LASIX) 40 MG tablet Take 1 tablet (40 mg total) by mouth daily. 30 tablet 1  . HYDROcodone-acetaminophen (NORCO/VICODIN) 5-325 MG tablet Take 1-2 tablets by mouth every 6 (six) hours as needed for moderate pain or severe pain. 10 tablet 0  . levothyroxine (SYNTHROID) 25 MCG tablet     . levothyroxine (SYNTHROID, LEVOTHROID) 75 MCG tablet Take 75 mcg by mouth daily.    .  metoprolol tartrate (LOPRESSOR) 25 MG tablet Take 1 tablet (25 mg total) by mouth 2 (two) times daily. 180 tablet 3  . prochlorperazine (COMPAZINE) 10 MG tablet Take 1 tablet (10 mg total) by mouth 3 (three) times daily before  meals. 30 tablet 1  . VITAMIN E PO Take 1 tablet by mouth daily.     Marland Kitchen losartan (COZAAR) 25 MG tablet Take 0.5 tablets (12.5 mg total) by mouth daily. 45 tablet 3   No current facility-administered medications for this visit.      OBJECTIVE:  Vitals:   12/26/18 1137  BP: (!) 159/67  Pulse: 63  Resp: 18  Temp: 98.2 F (36.8 C)  SpO2: 94%     Body mass index is 37.86 kg/m.   Wt Readings from Last 3 Encounters:  12/26/18 207 lb (93.9 kg)  12/05/18 205 lb 14.4 oz (93.4 kg)  12/03/18 204 lb (92.5 kg)  ECOG FS:1 - Symptomatic but completely ambulatory GENERAL: Patient is a well appearing female in no acute distress HEENT:  Sclerae anicteric.  Oropharynx clear and moist. No ulcerations or evidence of oropharyngeal candidiasis. Neck is supple.  NODES:  No cervical, supraclavicular, or axillary lymphadenopathy palpated.  BREAST EXAM:  Deferred. LUNGS:  Clear to auscultation bilaterally.  No wheezes or rhonchi. HEART:  Regular rate and rhythm. No murmur appreciated. ABDOMEN:  Soft, nontender.  Positive, normoactive bowel sounds. No organomegaly palpated. MSK:  No focal spinal tenderness to palpation. Full range of motion bilaterally in the upper extremities. EXTREMITIES:  No peripheral edema.   SKIN:  Clear with no obvious rashes or skin changes. No nail dyscrasia. NEURO:  Nonfocal. Well oriented.  Appropriate affect.     LAB RESULTS:  CMP     Component Value Date/Time   NA 143 12/26/2018 1118   K 3.8 12/26/2018 1118   CL 108 12/26/2018 1118   CO2 27 12/26/2018 1118   GLUCOSE 98 12/26/2018 1118   BUN 7 (L) 12/26/2018 1118   CREATININE 0.89 12/26/2018 1118   CALCIUM 9.0 12/26/2018 1118   PROT 7.3 12/26/2018 1118   ALBUMIN 3.6 12/26/2018 1118   AST 18  12/26/2018 1118   ALT 12 12/26/2018 1118   ALKPHOS 77 12/26/2018 1118   BILITOT 0.7 12/26/2018 1118   GFRNONAA >60 12/26/2018 1118   GFRAA >60 12/26/2018 1118    No results found for: TOTALPROTELP, ALBUMINELP, A1GS, A2GS, BETS, BETA2SER, GAMS, MSPIKE, SPEI  No results found for: KPAFRELGTCHN, LAMBDASER, KAPLAMBRATIO  Lab Results  Component Value Date   WBC 7.5 12/26/2018   NEUTROABS 3.7 12/26/2018   HGB 12.7 12/26/2018   HCT 35.9 (L) 12/26/2018   MCV 84.7 12/26/2018   PLT 230 12/26/2018    '@LASTCHEMISTRY' @  No results found for: LABCA2  No components found for: BVQXIH038  No results for input(s): INR in the last 168 hours.  No results found for: LABCA2  No results found for: UEK800  No results found for: LKJ179  No results found for: XTA569  No results found for: CA2729  No components found for: HGQUANT  No results found for: CEA1 / No results found for: CEA1   No results found for: AFPTUMOR  No results found for: CHROMOGRNA  No results found for: PSA1  Appointment on 12/26/2018  Component Date Value Ref Range Status  . WBC 12/26/2018 7.5  4.0 - 10.5 K/uL Final  . RBC 12/26/2018 4.24  3.87 - 5.11 MIL/uL Final  . Hemoglobin 12/26/2018 12.7  12.0 - 15.0 g/dL Final  . HCT 12/26/2018 35.9* 36.0 - 46.0 % Final  . MCV 12/26/2018 84.7  80.0 - 100.0 fL Final  . MCH 12/26/2018 30.0  26.0 - 34.0 pg Final  . MCHC 12/26/2018 35.4  30.0 - 36.0 g/dL  Final  . RDW 12/26/2018 15.1  11.5 - 15.5 % Final  . Platelets 12/26/2018 230  150 - 400 K/uL Final  . nRBC 12/26/2018 0.0  0.0 - 0.2 % Final  . Neutrophils Relative % 12/26/2018 49  % Final  . Neutro Abs 12/26/2018 3.7  1.7 - 7.7 K/uL Final  . Lymphocytes Relative 12/26/2018 28  % Final  . Lymphs Abs 12/26/2018 2.1  0.7 - 4.0 K/uL Final  . Monocytes Relative 12/26/2018 16  % Final  . Monocytes Absolute 12/26/2018 1.2* 0.1 - 1.0 K/uL Final  . Eosinophils Relative 12/26/2018 5  % Final  . Eosinophils Absolute  12/26/2018 0.3  0.0 - 0.5 K/uL Final  . Basophils Relative 12/26/2018 1  % Final  . Basophils Absolute 12/26/2018 0.1  0.0 - 0.1 K/uL Final  . Immature Granulocytes 12/26/2018 1  % Final  . Abs Immature Granulocytes 12/26/2018 0.06  0.00 - 0.07 K/uL Final   Performed at [facility], [place] [address], [place], [place] [address]  . Sodium 12/26/2018 143  135 - 145 mmol/L Final  . Potassium 12/26/2018 3.8  3.5 - 5.1 mmol/L Final  . Chloride 12/26/2018 108  98 - 111 mmol/L Final  . CO2 12/26/2018 27  22 - 32 mmol/L Final  . Glucose, Bld 12/26/2018 98  70 - 99 mg/dL Final  . BUN 12/26/2018 7* 8 - 23 mg/dL Final  . Creatinine, Ser 12/26/2018 0.89  0.44 - 1.00 mg/dL Final  . Calcium 12/26/2018 9.0  8.9 - 10.3 mg/dL Final  . Total Protein 12/26/2018 7.3  6.5 - 8.1 g/dL Final  . Albumin 12/26/2018 3.6  3.5 - 5.0 g/dL Final  . AST 12/26/2018 18  15 - 41 U/L Final  . ALT 12/26/2018 12  0 - 44 U/L Final  . Alkaline Phosphatase 12/26/2018 77  38 - 126 U/L Final  . Total Bilirubin 12/26/2018 0.7  0.3 - 1.2 mg/dL Final  . GFR calc non Af Amer 12/26/2018 >60  >60 mL/min Final  . GFR calc Af Amer 12/26/2018 >60  >60 mL/min Final  . Anion gap 12/26/2018 8  5 - 15 Final   Performed at [facility], [address] [place], [place] [address]    (this displays the last labs from the last 3 days)  No results found for: TOTALPROTELP, ALBUMINELP, A1GS, A2GS, BETS, BETA2SER, GAMS, MSPIKE, SPEI (this displays SPEP labs)  No results found for: KPAFRELGTCHN, LAMBDASER, KAPLAMBRATIO (kappa/lambda light chains)  No results found for: HGBA, HGBA2QUANT, HGBFQUANT, HGBSQUAN (Hemoglobinopathy evaluation)   No results found for: LDH  No results found for: IRON, TIBC, IRONPCTSAT (Iron and TIBC)  No results found for: FERRITIN  Urinalysis    Component Value Date/Time   COLORURINE YELLOW 07/14/2017 2015   APPEARANCEUR HAZY (A) 07/14/2017 2015   LABSPEC 1.011  07/14/2017 2015   PHURINE 6.0 07/14/2017 2015   [place] 07/14/2017 2015   [place] 07/14/2017 2015   [place] 07/14/2017 2015   [place] 07/14/2017 2015   PROTEINUR NEGATIVE 07/14/2017 2015   UROBILINOGEN 0.2 07/03/2014 0852   NITRITE NEGATIVE 07/14/2017 2015   LEUKOCYTESUR LARGE (A) 07/14/2017 2015     STUDIES:  No results found.   ELIGIBLE FOR AVAILABLE RESEARCH PROTOCOL: no   ASSESSMENT: 73 y.o. [place], [place] woman status post left breast lower inner quadrant biopsy 08/01/2018 for a clinical T1c N0, stage IB invasive ductal carcinoma, grade 2, triple negative, with an MIB-1 of  40%  (1) genetics testing 08/29/2018 through with Hereditary Gene Panel offered by [facility] found no deleterious mutations in APC, ATM, AXIN2, BARD1, BMPR1A, BRCA1, BRCA2, BRIP1, CDH1, CDK4, CDKN2A (p14ARF), CDKN2A (p16INK4a), CHEK2, CTNNA1, DICER1, EPCAM (Deletion/duplication testing only), GREM1 (promoter region deletion/duplication testing only), KIT, MEN1, MLH1, MSH2, MSH3, MSH6, MUTYH, NBN, NF1, NHTL1, PALB2, PDGFRA, PMS2, POLD1, POLE, PTEN, RAD50, RAD51C, RAD51D, SDHB, SDHC, SDHD, SMAD4, SMARCA4. STK11, TP53, TSC1, TSC2, and VHL.  The following genes were evaluated for sequence changes only: SDHA and HOXB13 c.251G>A variant only.   (a) MLH1 c.1890T>G VUS identified  (2) status post left lumpectomy with sentinel lymph node sampling 09/01/2022 a pT1c pN0, stage IB invasive ductal carcinoma, grade 2, with negative margins.  (a) a total of 3 sentinel lymph nodes were removed  (3) chemotherapy consisting of cyclophosphamide, methotrexate, and fluorouracil (CMF), repeated every 21 days x 8, starting 10/24/2018  (4) adjuvant radiation to be given concurrently with cycles 3-4-5 of chemotherapy   PLAN: Hugh is doing well today.  Her labs are stable and she is tolerating treatment well.  She will proceed with her next cycle of CMF (without Methotrexate due to radiation).     Safiyah and I talked about healthy diet and exercise today.  She will undergo port placement on Friday.  She tells me she has been instructed to hold certain medications until after her placement.  She is doing this.  Jlee will return in 3 weeks for labs, f/u and her next treatment.  I let her know that since she will have a port at her next visit, she will have a flush appointment where they will draw labs from her port prior to her appointment with me.  I sent in Emla cream for her and explained to her how to use it.    She knows to call for any other issue that may develop before the next visit.  A total of (30) minutes of face-to-face time was spent with this patient with greater than 50% of that time in counseling and care-coordination.   Wilber Bihari, NP  12/26/18 11:55 AM Medical Oncology and Hematology [facility] [address] [place], [place] [address] Tel. [phone]    Fax. [phone]

## 2018-12-26 NOTE — Progress Notes (Signed)
Good blood return noted from peripheral IV before, during, and after Fluorouracil push.

## 2018-12-26 NOTE — Patient Instructions (Signed)
[facility] Discharge Instructions for Patients Receiving Chemotherapy  Today you received the following chemotherapy agents Cytoxan and Fluorouracil.  To help prevent nausea and vomiting after your treatment, we encourage you to take your nausea medication as directed.  If you develop nausea and vomiting that is not controlled by your nausea medication, call the clinic.   BELOW ARE SYMPTOMS THAT SHOULD BE REPORTED IMMEDIATELY:  *FEVER GREATER THAN 100.5 F  *CHILLS WITH OR WITHOUT FEVER  NAUSEA AND VOMITING THAT IS NOT CONTROLLED WITH YOUR NAUSEA MEDICATION  *UNUSUAL SHORTNESS OF BREATH  *UNUSUAL BRUISING OR BLEEDING  TENDERNESS IN MOUTH AND THROAT WITH OR WITHOUT PRESENCE OF ULCERS  *URINARY PROBLEMS  *BOWEL PROBLEMS  UNUSUAL RASH Items with * indicate a potential emergency and should be followed up as soon as possible.  Feel free to call the clinic should you have any questions or concerns. The clinic phone number is [phone].  Please show the [facility] at check-in to the Emergency Department and triage nurse.

## 2018-12-27 ENCOUNTER — Ambulatory Visit
Admission: RE | Admit: 2018-12-27 | Discharge: 2018-12-27 | Disposition: A | Discharge status: Home / Self Care | Payer: Medicare HMO | Source: Ambulatory Visit | Attending: Radiation Oncology | Admitting: Radiation Oncology

## 2018-12-27 ENCOUNTER — Other Ambulatory Visit: Payer: Self-pay | Admitting: Student

## 2018-12-27 ENCOUNTER — Other Ambulatory Visit: Payer: Self-pay

## 2018-12-27 DIAGNOSIS — Z171 Estrogen receptor negative status [ER-]: Secondary | ICD-10-CM | POA: Diagnosis not present

## 2018-12-27 DIAGNOSIS — Z510 Encounter for antineoplastic radiation therapy: Secondary | ICD-10-CM | POA: Diagnosis not present

## 2018-12-27 DIAGNOSIS — C50512 Malignant neoplasm of lower-outer quadrant of left female breast: Secondary | ICD-10-CM | POA: Diagnosis not present

## 2018-12-28 ENCOUNTER — Ambulatory Visit
Admission: RE | Admit: 2018-12-28 | Discharge: 2018-12-28 | Disposition: A | Discharge status: Home / Self Care | Payer: Medicare HMO | Source: Ambulatory Visit | Attending: Radiation Oncology | Admitting: Radiation Oncology

## 2018-12-28 ENCOUNTER — Other Ambulatory Visit: Payer: Self-pay

## 2018-12-28 ENCOUNTER — Other Ambulatory Visit: Payer: Self-pay | Admitting: Student

## 2018-12-28 DIAGNOSIS — C50512 Malignant neoplasm of lower-outer quadrant of left female breast: Secondary | ICD-10-CM | POA: Diagnosis not present

## 2018-12-28 DIAGNOSIS — Z171 Estrogen receptor negative status [ER-]: Secondary | ICD-10-CM | POA: Diagnosis not present

## 2018-12-28 DIAGNOSIS — Z510 Encounter for antineoplastic radiation therapy: Secondary | ICD-10-CM | POA: Diagnosis not present

## 2018-12-29 ENCOUNTER — Ambulatory Visit (HOSPITAL_COMMUNITY)
Admission: RE | Admit: 2018-12-29 | Discharge: 2018-12-29 | Disposition: A | Discharge status: Home / Self Care | Payer: Medicare HMO | Source: Ambulatory Visit | Attending: Oncology | Admitting: Oncology

## 2018-12-29 ENCOUNTER — Ambulatory Visit
Admission: RE | Admit: 2018-12-29 | Discharge: 2018-12-29 | Disposition: A | Discharge status: Home / Self Care | Payer: Medicare HMO | Source: Ambulatory Visit | Attending: Radiation Oncology | Admitting: Radiation Oncology

## 2018-12-29 ENCOUNTER — Ambulatory Visit (HOSPITAL_COMMUNITY)
Admission: RE | Admit: 2018-12-29 | Discharge: 2018-12-29 | Disposition: A | Discharge status: Home / Self Care | Payer: Medicare HMO | Source: Ambulatory Visit | Attending: Internal Medicine | Admitting: Internal Medicine

## 2018-12-29 ENCOUNTER — Other Ambulatory Visit: Payer: Self-pay | Admitting: Oncology

## 2018-12-29 ENCOUNTER — Encounter (HOSPITAL_COMMUNITY): Payer: Self-pay

## 2018-12-29 ENCOUNTER — Other Ambulatory Visit: Payer: Self-pay

## 2018-12-29 DIAGNOSIS — C50512 Malignant neoplasm of lower-outer quadrant of left female breast: Secondary | ICD-10-CM

## 2018-12-29 DIAGNOSIS — Z171 Estrogen receptor negative status [ER-]: Secondary | ICD-10-CM | POA: Diagnosis not present

## 2018-12-29 DIAGNOSIS — Z79899 Other long term (current) drug therapy: Secondary | ICD-10-CM | POA: Insufficient documentation

## 2018-12-29 DIAGNOSIS — I252 Old myocardial infarction: Secondary | ICD-10-CM | POA: Diagnosis not present

## 2018-12-29 DIAGNOSIS — Z7982 Long term (current) use of aspirin: Secondary | ICD-10-CM | POA: Diagnosis not present

## 2018-12-29 DIAGNOSIS — E039 Hypothyroidism, unspecified: Secondary | ICD-10-CM | POA: Diagnosis not present

## 2018-12-29 DIAGNOSIS — Z510 Encounter for antineoplastic radiation therapy: Secondary | ICD-10-CM | POA: Diagnosis not present

## 2018-12-29 DIAGNOSIS — Z951 Presence of aortocoronary bypass graft: Secondary | ICD-10-CM | POA: Diagnosis not present

## 2018-12-29 DIAGNOSIS — I10 Essential (primary) hypertension: Secondary | ICD-10-CM | POA: Insufficient documentation

## 2018-12-29 DIAGNOSIS — Z9889 Other specified postprocedural states: Secondary | ICD-10-CM | POA: Diagnosis not present

## 2018-12-29 DIAGNOSIS — E669 Obesity, unspecified: Secondary | ICD-10-CM | POA: Insufficient documentation

## 2018-12-29 DIAGNOSIS — Z452 Encounter for adjustment and management of vascular access device: Secondary | ICD-10-CM | POA: Diagnosis not present

## 2018-12-29 DIAGNOSIS — M8580 Other specified disorders of bone density and structure, unspecified site: Secondary | ICD-10-CM | POA: Diagnosis not present

## 2018-12-29 DIAGNOSIS — Z5111 Encounter for antineoplastic chemotherapy: Secondary | ICD-10-CM | POA: Diagnosis not present

## 2018-12-29 DIAGNOSIS — C50912 Malignant neoplasm of unspecified site of left female breast: Secondary | ICD-10-CM | POA: Diagnosis not present

## 2018-12-29 DIAGNOSIS — I2510 Atherosclerotic heart disease of native coronary artery without angina pectoris: Secondary | ICD-10-CM | POA: Diagnosis not present

## 2018-12-29 DIAGNOSIS — Z79890 Hormone replacement therapy (postmenopausal): Secondary | ICD-10-CM | POA: Insufficient documentation

## 2018-12-29 HISTORY — PX: IR IMAGING GUIDED PORT INSERTION: IMG5740

## 2018-12-29 LAB — CBC
HCT: 36.7 % (ref 36.0–46.0)
Hemoglobin: 12.4 g/dL (ref 12.0–15.0)
MCH: 29.5 pg (ref 26.0–34.0)
MCHC: 33.8 g/dL (ref 30.0–36.0)
MCV: 87.4 fL (ref 80.0–100.0)
Platelets: 221 10*3/uL (ref 150–400)
RBC: 4.2 MIL/uL (ref 3.87–5.11)
RDW: 15.1 % (ref 11.5–15.5)
WBC: 8.7 10*3/uL (ref 4.0–10.5)
nRBC: 0 % (ref 0.0–0.2)

## 2018-12-29 LAB — PROTIME-INR
INR: 1.1 (ref 0.8–1.2)
Prothrombin Time: 13.8 seconds (ref 11.4–15.2)

## 2018-12-29 MED ORDER — FENTANYL CITRATE (PF) 100 MCG/2ML IJ SOLN
INTRAMUSCULAR | Status: AC | PRN
  Administered 2018-12-29 (×2): 50 ug via INTRAVENOUS

## 2018-12-29 MED ORDER — FENTANYL CITRATE (PF) 100 MCG/2ML IJ SOLN
INTRAMUSCULAR | Status: AC
  Filled 2018-12-29: qty 2

## 2018-12-29 MED ORDER — MIDAZOLAM HCL 2 MG/2ML IJ SOLN
INTRAMUSCULAR | Status: AC | PRN
  Administered 2018-12-29 (×2): 1 mg via INTRAVENOUS

## 2018-12-29 MED ORDER — SODIUM CHLORIDE 0.9 % IV SOLN
INTRAVENOUS | Status: DC
  Administered 2018-12-29: 13:00:00 via INTRAVENOUS

## 2018-12-29 MED ORDER — MIDAZOLAM HCL 2 MG/2ML IJ SOLN
INTRAMUSCULAR | Status: AC
  Filled 2018-12-29: qty 4

## 2018-12-29 MED ORDER — LIDOCAINE-EPINEPHRINE 1 %-1:100000 IJ SOLN
INTRAMUSCULAR | Status: AC
  Filled 2018-12-29: qty 1

## 2018-12-29 MED ORDER — LIDOCAINE HCL (PF) 1 % IJ SOLN
INTRAMUSCULAR | Status: AC | PRN
  Administered 2018-12-29: 10 mL

## 2018-12-29 MED ORDER — CEFAZOLIN SODIUM-DEXTROSE 2-4 GM/100ML-% IV SOLN
2.0000 g | Freq: Once | INTRAVENOUS | Status: AC
  Administered 2018-12-29: 15:00:00 2 g via INTRAVENOUS

## 2018-12-29 MED ORDER — HEPARIN SOD (PORK) LOCK FLUSH 100 UNIT/ML IV SOLN
INTRAVENOUS | Status: AC
  Filled 2018-12-29: qty 5

## 2018-12-29 MED ORDER — CEFAZOLIN SODIUM-DEXTROSE 2-4 GM/100ML-% IV SOLN
INTRAVENOUS | Status: AC
  Administered 2018-12-29: 2 g via INTRAVENOUS
  Filled 2018-12-29: qty 100

## 2018-12-29 MED ORDER — LIDOCAINE HCL (PF) 1 % IJ SOLN
INTRAMUSCULAR | Status: AC | PRN
  Administered 2018-12-29: 5 mL

## 2018-12-29 NOTE — Procedures (Signed)
Interventional Radiology Procedure Note  Procedure: Placement of a right IJ approach single lumen PowerPort.  Tip is positioned at the superior cavoatrial junction and catheter is ready for immediate use.  Complications: No immediate Recommendations:  - Ok to shower tomorrow - Do not submerge for 7 days - Routine line care   Signed,  Heath K. McCullough, MD   

## 2018-12-29 NOTE — Consult Note (Signed)
Chief Complaint: Patient was seen in consultation today for   Referring Physician(s): Chauncey Cruel  Supervising Physician: Jacqulynn Cadet  Patient Status: [facility] - Out-pt  History of Present Illness: Gloria Mccoy is a 73 y.o. female with history of left breast carcinoma diagnosed in February of this year, status post lumpectomy.  She has poor venous access and presents today for Port-A-Cath placement for additional chemotherapy.  Past Medical History:  Diagnosis Date  . Arthritis    "hands sometimes" (07/13/2017)  . Coronary artery disease    a. s/p CABG x3 in 06/2017 with LIMA-LAD, SVG-D1, and SVG-RI.  b. 10/2017: cath showing a widely patent LIMA-LAD with ostial occlusion of the SVG-RI and subtotally occluded atretic SVG-D1. Graft occlusion thought to be 2ry to improvement in pre-CABG stenoses.   . Essential hypertension   . Family history of adverse reaction to anesthesia    sister had a complication that was stated she had a "foggy" episode after her breast surgery was readmitted 1 day post op after being discharged from the hospital. Sister also has significant lung problems that could have contributed to this   . Family history of breast cancer   . Family history of colon cancer   . Hypothyroid   . Meniere disease   . Myocardial infarction ([place]) 06/2017   during cardaic rehab  . Obesity   . Osteopenia 01/2012   T score -1.3 FRAX 7.9%/0.6%    Past Surgical History:  Procedure Laterality Date  . BREAST LUMPECTOMY WITH RADIOACTIVE SEED AND SENTINEL LYMPH NODE BIOPSY Left 09/01/2018   Procedure: LEFT BREAST LUMPECTOMY WITH RADIOACTIVE SEED AND SENTINEL LYMPH NODE BIOPSY;  Surgeon: Jovita Kussmaul, MD;  Location: [place];  Service: General;  Laterality: Left;  . BREAST SURGERY    . CARDIAC CATHETERIZATION  07/13/2017  . CATARACT EXTRACTION W/PHACO Right 01/28/2015   Procedure: CATARACT EXTRACTION PHACO AND INTRAOCULAR LENS PLACEMENT :  CDE:  5.70;  Surgeon: Rutherford Guys, MD;  Location: [facility];  Service: Ophthalmology;  Laterality: Right;  . CATARACT EXTRACTION W/PHACO Left 02/11/2015   Procedure: CATARACT EXTRACTION PHACO AND INTRAOCULAR LENS PLACEMENT (IOC);  Surgeon: Rutherford Guys, MD;  Location: [facility];  Service: Ophthalmology;  Laterality: Left;  CDE: 7.38  . COLONOSCOPY N/A 09/26/2012   Procedure: COLONOSCOPY;  Surgeon: Jamesetta So, MD;  Location: [facility];  Service: Gastroenterology;  Laterality: N/A;  . CORONARY ARTERY BYPASS GRAFT N/A 07/15/2017   Procedure: CORONARY ARTERY BYPASS GRAFTING (CABG) X 3 USING LEFT INTERNAL MAMMARY ARTERY AND RIGHT SAPHENOUS VEIN- ENDOSCOPICALLY HARVESTED;  Surgeon: Melrose Nakayama, MD;  Location: [place];  Service: Open Heart Surgery;  Laterality: N/A;  . LEFT HEART CATH AND CORONARY ANGIOGRAPHY N/A 07/13/2017   Procedure: LEFT HEART CATH AND CORONARY ANGIOGRAPHY;  Surgeon: Martinique, Peter M, MD;  Location: [place] [facility];  Service: Cardiovascular;  Laterality: N/A;  . LEFT HEART CATH AND CORS/GRAFTS ANGIOGRAPHY N/A 11/03/2017   Procedure: LEFT HEART CATH AND CORS/GRAFTS ANGIOGRAPHY;  Surgeon: Troy Sine, MD;  Location: [place] [facility];  Service: Cardiovascular;  Laterality: N/A;  . TEE WITHOUT CARDIOVERSION N/A 07/15/2017   Procedure: TRANSESOPHAGEAL ECHOCARDIOGRAM (TEE);  Surgeon: Melrose Nakayama, MD;  Location: [place];  Service: Open Heart Surgery;  Laterality: N/A;  . TUBAL LIGATION    . TYMPANOPLASTY Left    fluid from ear drum    Allergies: Aloe vera, Sulfa antibiotics, and Sulfasalazine  Medications: Prior to Admission medications   Medication  Sig Start Date End Date Taking? Authorizing Provider  acetaminophen (TYLENOL) 325 MG tablet Take 2 tablets (650 mg total) by mouth every 6 (six) hours as needed for mild pain. 07/20/17   Nani Skillern, PA-C  aspirin EC 81 MG tablet Take 81 mg by mouth daily.    [provider]  atorvastatin (LIPITOR) 80 MG tablet Take 1 tablet  (80 mg total) by mouth daily at 6 PM. 11/03/17   Kroeger, Lorelee Cover., PA-C  Cholecalciferol (VITAMIN D PO) Take 1 tablet by mouth daily.     [provider]  clopidogrel (PLAVIX) 75 MG tablet Take 1 tablet by mouth once daily 11/15/18   Arnoldo Lenis, MD  Cyanocobalamin (VITAMIN B-12 PO) Take 1 tablet by mouth daily.    [provider]  dexamethasone (DECADRON) 4 MG tablet Take 1 tablet (4 mg total) by mouth daily. Start the day after chemotherapy for 2 days. Take with food. 10/20/18   Magrinat, Virgie Dad, MD  fish oil-omega-3 fatty acids 1000 MG capsule Take 1 g by mouth daily.    [provider]  furosemide (LASIX) 40 MG tablet Take 1 tablet (40 mg total) by mouth daily. 09/06/17   Melrose Nakayama, MD  HYDROcodone-acetaminophen (NORCO/VICODIN) 5-325 MG tablet Take 1-2 tablets by mouth every 6 (six) hours as needed for moderate pain or severe pain. 09/01/18   Jovita Kussmaul, MD  levothyroxine (SYNTHROID) 25 MCG tablet  12/18/18   [provider]  levothyroxine (SYNTHROID, LEVOTHROID) 75 MCG tablet Take 75 mcg by mouth daily.    [provider]  lidocaine-prilocaine (EMLA) cream Apply 1 application topically as needed. 12/26/18   Magrinat, Virgie Dad, MD  losartan (COZAAR) 25 MG tablet Take 0.5 tablets (12.5 mg total) by mouth daily. 08/22/18 11/20/18  Arnoldo Lenis, MD  metoprolol tartrate (LOPRESSOR) 25 MG tablet Take 1 tablet (25 mg total) by mouth 2 (two) times daily. 12/12/18   Arnoldo Lenis, MD  prochlorperazine (COMPAZINE) 10 MG tablet Take 1 tablet (10 mg total) by mouth 3 (three) times daily before meals. 10/20/18   Magrinat, Virgie Dad, MD  VITAMIN E PO Take 1 tablet by mouth daily.     [provider]     Family History  Problem Relation Age of Onset  . Diabetes Sister        AODM  . Heart disease Sister 66       CABG  . Breast cancer Sister   . Heart disease Brother 52       In his 24s  . Colon cancer Maternal Uncle 2  .  Diabetes Sister        AODM  . Hypertension Daughter   . Hypertension Daughter   . Breast cancer Cousin        PATERNAL COUSIN  . Heart attack Father        In his 26s  . Stroke Mother   . Stroke Maternal Grandmother   . Diabetes Maternal Grandfather        d. 109  . Heart attack Paternal Grandmother   . Colon cancer Maternal Uncle 58  . Breast cancer Cousin        dx in her 46s; mat first cousin  . Breast cancer Cousin        dx 23s; d. 35s  . Breast cancer Niece 51    Social History   Socioeconomic History  . Marital status: Married    Spouse name: Not  on file  . Number of children: Not on file  . Years of education: Not on file  . Highest education level: Not on file  Occupational History  . Occupation: Retired  Scientific laboratory technician  . Financial resource strain: Not on file  . Food insecurity    Worry: Not on file    Inability: Not on file  . Transportation needs    Medical: No    Non-medical: No  Tobacco Use  . Smoking status: Never Smoker  . Smokeless tobacco: Former Systems developer    Types: Snuff  Substance and Sexual Activity  . Alcohol use: Yes    Alcohol/week: 1.0 standard drinks    Types: 1 Standard drinks or equivalent per week    Comment: occasionally  . Drug use: No  . Sexual activity: Yes    Birth control/protection: Surgical  Lifestyle  . Physical activity    Days per week: Not on file    Minutes per session: Not on file  . Stress: Not on file  Relationships  . Social Herbalist on phone: Not on file    Gets together: Not on file    Attends religious service: Not on file    Active member of club or organization: Not on file    Attends meetings of clubs or organizations: Not on file    Relationship status: Not on file  Other Topics Concern  . Not on file  Social History Narrative  . Not on file      Review of Systems denies fever, headache, worsening chest pain, dyspnea, cough, abdominal pain, nausea, vomiting or bleeding.  She does have  lower extremity swelling and back pain.  Vital Signs: Blood pressure 148/78, heart rate 61, temp 98.3, respirations 16, O2 sats 94% room air   Physical Exam awake, alert.  Chest with slightly diminished breath sounds left base, right clear.  Heart with regular rate and rhythm.  Abdomen soft, positive bowel sounds, nontender.  Bilateral lower extremity edema noted with some erythema and warmth.  Imaging: No results found.  Labs:  CBC: Recent Labs    11/03/18 1006 11/14/18 1322 12/05/18 1217 12/26/18 1118  WBC 8.3 12.2* 9.8 7.5  HGB 12.9 13.5 12.8 12.7  HCT 36.6 37.5 36.5 35.9*  PLT 236 299 272 230    COAGS: No results for input(s): INR, APTT in the last 8760 hours.  BMP: Recent Labs    11/03/18 1006 11/14/18 1322 12/05/18 1217 12/26/18 1118  NA 141 140 143 143  K 3.4* 4.2 3.7 3.8  CL 104 105 108 108  CO2 29 26 24 27   GLUCOSE 103* 110* 93 98  BUN 9 17 11  7*  CALCIUM 8.8* 9.0 9.0 9.0  CREATININE 0.94 1.04* 0.91 0.89  GFRNONAA >60 53* >60 >60  GFRAA >60 >60 >60 >60    LIVER FUNCTION TESTS: Recent Labs    11/03/18 1006 11/14/18 1322 12/05/18 1217 12/26/18 1118  BILITOT 1.1 0.4 0.8 0.7  AST 18 17 22 18   ALT 17 17 17 12   ALKPHOS 78 80 80 77  PROT 7.3 7.6 7.0 7.3  ALBUMIN 3.4* 3.5 3.6 3.6    TUMOR MARKERS: No results for input(s): AFPTM, CEA, CA199, CHROMGRNA in the last 8760 hours.  Assessment and Plan: 73 y.o. female with history of left breast carcinoma diagnosed in February of this year, status post lumpectomy.  She has poor venous access and presents today for Port-A-Cath placement for additional chemotherapy.Risks and benefits of image  guided port-a-catheter placement was discussed with the patient including, but not limited to bleeding, infection, pneumothorax, or fibrin sheath development and need for additional procedures.  All of the patient's questions were answered, patient is agreeable to proceed. Consent signed and in chart.     Thank  you for this interesting consult.  I greatly enjoyed meeting Gloria Mccoy and look forward to participating in their care.  A copy of this report was sent to the requesting provider on this date.  Electronically Signed: D. Rowe Robert, PA-C 12/29/2018, 12:42 PM   I spent a total of  25 minutes   in face to face in clinical consultation, greater than 50% of which was counseling/coordinating care for Port-A-Cath placement

## 2018-12-29 NOTE — Discharge Instructions (Signed)
Moderate Conscious Sedation, Adult, Care After °These instructions provide you with information about caring for yourself after your procedure. Your health care provider may also give you more specific instructions. Your treatment has been planned according to current medical practices, but problems sometimes occur. Call your health care provider if you have any problems or questions after your procedure. °What can I expect after the procedure? °After your procedure, it is common: °· To feel sleepy for several hours. °· To feel clumsy and have poor balance for several hours. °· To have poor judgment for several hours. °· To vomit if you eat too soon. °Follow these instructions at home: °For at least 24 hours after the procedure: ° °· Do not: °? Participate in activities where you could fall or become injured. °? Drive. °? Use heavy machinery. °? Drink alcohol. °? Take sleeping pills or medicines that cause drowsiness. °? Make important decisions or sign legal documents. °? Take care of children on your own. °· Rest. °Eating and drinking °· Follow the diet recommended by your health care provider. °· If you vomit: °? Drink water, juice, or soup when you can drink without vomiting. °? Make sure you have little or no nausea before eating solid foods. °General instructions °· Have a responsible adult stay with you until you are awake and alert. °· Take over-the-counter and prescription medicines only as told by your health care provider. °· If you smoke, do not smoke without supervision. °· Keep all follow-up visits as told by your health care provider. This is important. °Contact a health care provider if: °· You keep feeling nauseous or you keep vomiting. °· You feel light-headed. °· You develop a rash. °· You have a fever. °Get help right away if: °· You have trouble breathing. °This information is not intended to replace advice given to you by your health care provider. Make sure you discuss any questions you have  with your health care provider. °Document Released: 03/28/2013 Document Revised: 05/20/2017 Document Reviewed: 09/27/2015 °Elsevier Patient Education © 2020 [employer] ° ° °Implanted Port Home Guide °An implanted port is a device that is placed under the skin. It is usually placed in the chest. The device can be used to give IV medicine, to take blood, or for dialysis. You may have an implanted port if: °· You need IV medicine that would be irritating to the small veins in your hands or arms. °· You need IV medicines, such as antibiotics, for a long period of time. °· You need IV nutrition for a long period of time. °· You need dialysis. °Having a port means that your health care provider will not need to use the veins in your arms for these procedures. You may have fewer limitations when using a port than you would if you used other types of long-term IVs, and you will likely be able to return to normal activities after your incision heals. °An implanted port has two main parts: °· Reservoir. The reservoir is the part where a needle is inserted to give medicines or draw blood. The reservoir is round. After it is placed, it appears as a small, raised area under your skin. °· Catheter. The catheter is a thin, flexible tube that connects the reservoir to a vein. Medicine that is inserted into the reservoir goes into the catheter and then into the vein. °How is my port accessed? °To access your port: °· A numbing cream may be placed on the skin over the port site. °·   Your health care provider will put on a mask and sterile gloves. °· The skin over your port will be cleaned carefully with a germ-killing soap and allowed to dry. °· Your health care provider will gently pinch the port and insert a needle into it. °· Your health care provider will check for a blood return to make sure the port is in the vein and is not clogged. °· If your port needs to remain accessed to get medicine continuously (constant infusion),  your health care provider will place a clear bandage (dressing) over the needle site. The dressing and needle will need to be changed every week, or as told by your health care provider. °What is flushing? °Flushing helps keep the port from getting clogged. Follow instructions from your health care provider about how and when to flush the port. Ports are usually flushed with saline solution or a medicine called heparin. The need for flushing will depend on how the port is used: °· If the port is only used from time to time to give medicines or draw blood, the port may need to be flushed: °? Before and after medicines have been given. °? Before and after blood has been drawn. °? As part of routine maintenance. Flushing may be recommended every 4-6 weeks. °· If a constant infusion is running, the port may not need to be flushed. °· Throw away any syringes in a disposal container that is meant for sharp items (sharps container). You can buy a sharps container from a pharmacy, or you can make one by using an empty hard plastic bottle with a cover. °How long will my port stay implanted? °The port can stay in for as long as your health care provider thinks it is needed. When it is time for the port to come out, a surgery will be done to remove it. The surgery will be similar to the procedure that was done to put the port in. °Follow these instructions at home: ° °· Flush your port as told by your health care provider. °· If you need an infusion over several days, follow instructions from your health care provider about how to take care of your port site. Make sure you: °? Wash your hands with soap and water before you change your dressing. If soap and water are not available, use alcohol-based hand sanitizer. °? Change your dressing as told by your health care provider.  You may shower and remove your dressing tomorrow.  DO NOT use EMLA cream for 2 weeks after port placement as this cream will remove surgical glue on your  incision.   °? Place any used dressings or infusion bags into a plastic bag. Throw that bag in the trash. °? Keep the dressing that covers the needle clean and dry. Do not get it wet. °? Do not use scissors or sharp objects near the tube. °? Keep the tube clamped, unless it is being used. °· Check your port site every day for signs of infection. Check for: °? Redness, swelling, or pain. °? Fluid or blood. °? Pus or a bad smell. °· Protect the skin around the port site. °? Avoid wearing bra straps that rub or irritate the site. °? Protect the skin around your port from seat belts. Place a soft pad over your chest if needed. °· Bathe or shower as told by your health care provider. The site may get wet as long as you are not actively receiving an infusion.  You may shower   tomorrow. °· Return to your normal activities as told by your health care provider. Ask your health care provider what activities are safe for you. °· Carry a medical alert card or wear a medical alert bracelet at all times. This will let health care providers know that you have an implanted port in case of an emergency. °Get help right away if: °· You have redness, swelling, or pain at the port site. °· You have fluid or blood coming from your port site. °· You have pus or a bad smell coming from the port site. °· You have a fever. °Summary °· Implanted ports are usually placed in the chest for long-term IV access. °· Follow instructions from your health care provider about flushing the port and changing bandages (dressings). °· Take care of the area around your port by avoiding clothing that puts pressure on the area, and by watching for signs of infection. °· Protect the skin around your port from seat belts. Place a soft pad over your chest if needed. °· Get help right away if you have a fever or you have redness, swelling, pain, drainage, or a bad smell at the port site. °This information is not intended to replace advice given to you by your  health care provider. Make sure you discuss any questions you have with your health care provider. °Document Released: 06/07/2005 Document Revised: 09/29/2018 Document Reviewed: 07/10/2016 °Elsevier Patient Education © 2020 [employer] Inc[employer] ° ° °

## 2019-01-01 ENCOUNTER — Other Ambulatory Visit: Payer: Self-pay

## 2019-01-01 ENCOUNTER — Ambulatory Visit
Admission: RE | Admit: 2019-01-01 | Discharge: 2019-01-01 | Disposition: A | Discharge status: Home / Self Care | Payer: Medicare HMO | Source: Ambulatory Visit | Attending: Radiation Oncology | Admitting: Radiation Oncology

## 2019-01-01 DIAGNOSIS — Z510 Encounter for antineoplastic radiation therapy: Secondary | ICD-10-CM | POA: Diagnosis not present

## 2019-01-01 DIAGNOSIS — Z171 Estrogen receptor negative status [ER-]: Secondary | ICD-10-CM | POA: Diagnosis not present

## 2019-01-01 DIAGNOSIS — C50512 Malignant neoplasm of lower-outer quadrant of left female breast: Secondary | ICD-10-CM | POA: Diagnosis not present

## 2019-01-02 ENCOUNTER — Other Ambulatory Visit: Payer: Self-pay

## 2019-01-02 ENCOUNTER — Ambulatory Visit
Admission: RE | Admit: 2019-01-02 | Discharge: 2019-01-02 | Disposition: A | Discharge status: Home / Self Care | Payer: Medicare HMO | Source: Ambulatory Visit | Attending: Radiation Oncology | Admitting: Radiation Oncology

## 2019-01-02 DIAGNOSIS — Z510 Encounter for antineoplastic radiation therapy: Secondary | ICD-10-CM | POA: Diagnosis not present

## 2019-01-02 DIAGNOSIS — C50512 Malignant neoplasm of lower-outer quadrant of left female breast: Secondary | ICD-10-CM | POA: Diagnosis not present

## 2019-01-02 DIAGNOSIS — Z171 Estrogen receptor negative status [ER-]: Secondary | ICD-10-CM | POA: Diagnosis not present

## 2019-01-03 ENCOUNTER — Ambulatory Visit
Admission: RE | Admit: 2019-01-03 | Discharge: 2019-01-03 | Disposition: A | Discharge status: Home / Self Care | Payer: Medicare HMO | Source: Ambulatory Visit | Attending: Radiation Oncology | Admitting: Radiation Oncology

## 2019-01-03 ENCOUNTER — Other Ambulatory Visit: Payer: Medicare HMO

## 2019-01-03 ENCOUNTER — Other Ambulatory Visit: Payer: Self-pay

## 2019-01-03 DIAGNOSIS — Z171 Estrogen receptor negative status [ER-]: Secondary | ICD-10-CM | POA: Diagnosis not present

## 2019-01-03 DIAGNOSIS — R6889 Other general symptoms and signs: Secondary | ICD-10-CM | POA: Diagnosis not present

## 2019-01-03 DIAGNOSIS — Z510 Encounter for antineoplastic radiation therapy: Secondary | ICD-10-CM | POA: Diagnosis not present

## 2019-01-03 DIAGNOSIS — Z20822 Contact with and (suspected) exposure to covid-19: Secondary | ICD-10-CM

## 2019-01-03 DIAGNOSIS — C50512 Malignant neoplasm of lower-outer quadrant of left female breast: Secondary | ICD-10-CM | POA: Diagnosis not present

## 2019-01-04 ENCOUNTER — Other Ambulatory Visit: Payer: Self-pay

## 2019-01-04 ENCOUNTER — Ambulatory Visit
Admission: RE | Admit: 2019-01-04 | Discharge: 2019-01-04 | Disposition: A | Discharge status: Home / Self Care | Payer: Medicare HMO | Source: Ambulatory Visit | Attending: Radiation Oncology | Admitting: Radiation Oncology

## 2019-01-04 DIAGNOSIS — C50512 Malignant neoplasm of lower-outer quadrant of left female breast: Secondary | ICD-10-CM | POA: Diagnosis not present

## 2019-01-04 DIAGNOSIS — Z171 Estrogen receptor negative status [ER-]: Secondary | ICD-10-CM | POA: Diagnosis not present

## 2019-01-04 DIAGNOSIS — Z510 Encounter for antineoplastic radiation therapy: Secondary | ICD-10-CM | POA: Diagnosis not present

## 2019-01-05 ENCOUNTER — Ambulatory Visit
Admission: RE | Admit: 2019-01-05 | Discharge: 2019-01-05 | Disposition: A | Discharge status: Home / Self Care | Payer: Medicare HMO | Source: Ambulatory Visit | Attending: Radiation Oncology | Admitting: Radiation Oncology

## 2019-01-05 ENCOUNTER — Other Ambulatory Visit: Payer: Self-pay

## 2019-01-05 DIAGNOSIS — C50512 Malignant neoplasm of lower-outer quadrant of left female breast: Secondary | ICD-10-CM | POA: Diagnosis not present

## 2019-01-05 DIAGNOSIS — Z510 Encounter for antineoplastic radiation therapy: Secondary | ICD-10-CM | POA: Diagnosis not present

## 2019-01-05 DIAGNOSIS — Z171 Estrogen receptor negative status [ER-]: Secondary | ICD-10-CM | POA: Diagnosis not present

## 2019-01-07 LAB — NOVEL CORONAVIRUS, NAA: SARS-CoV-2, NAA: NOT DETECTED

## 2019-01-08 ENCOUNTER — Other Ambulatory Visit: Payer: Self-pay

## 2019-01-08 ENCOUNTER — Ambulatory Visit
Admission: RE | Admit: 2019-01-08 | Discharge: 2019-01-08 | Disposition: A | Discharge status: Home / Self Care | Payer: Medicare HMO | Source: Ambulatory Visit | Attending: Radiation Oncology | Admitting: Radiation Oncology

## 2019-01-08 DIAGNOSIS — Z510 Encounter for antineoplastic radiation therapy: Secondary | ICD-10-CM | POA: Diagnosis not present

## 2019-01-08 DIAGNOSIS — Z171 Estrogen receptor negative status [ER-]: Secondary | ICD-10-CM | POA: Diagnosis not present

## 2019-01-08 DIAGNOSIS — C50512 Malignant neoplasm of lower-outer quadrant of left female breast: Secondary | ICD-10-CM | POA: Diagnosis not present

## 2019-01-09 ENCOUNTER — Ambulatory Visit
Admission: RE | Admit: 2019-01-09 | Discharge: 2019-01-09 | Disposition: A | Discharge status: Home / Self Care | Payer: Medicare HMO | Source: Ambulatory Visit | Attending: Radiation Oncology | Admitting: Radiation Oncology

## 2019-01-09 ENCOUNTER — Other Ambulatory Visit: Payer: Self-pay

## 2019-01-09 DIAGNOSIS — C50512 Malignant neoplasm of lower-outer quadrant of left female breast: Secondary | ICD-10-CM | POA: Diagnosis not present

## 2019-01-09 DIAGNOSIS — Z171 Estrogen receptor negative status [ER-]: Secondary | ICD-10-CM | POA: Diagnosis not present

## 2019-01-09 DIAGNOSIS — Z510 Encounter for antineoplastic radiation therapy: Secondary | ICD-10-CM | POA: Diagnosis not present

## 2019-01-10 ENCOUNTER — Other Ambulatory Visit: Payer: Self-pay

## 2019-01-10 ENCOUNTER — Ambulatory Visit
Admission: RE | Admit: 2019-01-10 | Discharge: 2019-01-10 | Disposition: A | Discharge status: Home / Self Care | Payer: Medicare HMO | Source: Ambulatory Visit | Attending: Radiation Oncology | Admitting: Radiation Oncology

## 2019-01-10 DIAGNOSIS — Z171 Estrogen receptor negative status [ER-]: Secondary | ICD-10-CM | POA: Diagnosis not present

## 2019-01-10 DIAGNOSIS — Z510 Encounter for antineoplastic radiation therapy: Secondary | ICD-10-CM | POA: Diagnosis not present

## 2019-01-10 DIAGNOSIS — C50512 Malignant neoplasm of lower-outer quadrant of left female breast: Secondary | ICD-10-CM | POA: Diagnosis not present

## 2019-01-11 ENCOUNTER — Ambulatory Visit
Admission: RE | Admit: 2019-01-11 | Discharge: 2019-01-11 | Disposition: A | Discharge status: Home / Self Care | Payer: Medicare HMO | Source: Ambulatory Visit | Attending: Radiation Oncology | Admitting: Radiation Oncology

## 2019-01-11 ENCOUNTER — Other Ambulatory Visit: Payer: Self-pay

## 2019-01-11 DIAGNOSIS — C50512 Malignant neoplasm of lower-outer quadrant of left female breast: Secondary | ICD-10-CM | POA: Diagnosis not present

## 2019-01-11 DIAGNOSIS — Z171 Estrogen receptor negative status [ER-]: Secondary | ICD-10-CM | POA: Diagnosis not present

## 2019-01-11 DIAGNOSIS — Z510 Encounter for antineoplastic radiation therapy: Secondary | ICD-10-CM | POA: Diagnosis not present

## 2019-01-12 ENCOUNTER — Ambulatory Visit: Payer: Medicare HMO | Admitting: Radiation Oncology

## 2019-01-12 ENCOUNTER — Ambulatory Visit
Admission: RE | Admit: 2019-01-12 | Discharge: 2019-01-12 | Disposition: A | Discharge status: Home / Self Care | Payer: Medicare HMO | Source: Ambulatory Visit | Attending: Radiation Oncology | Admitting: Radiation Oncology

## 2019-01-12 ENCOUNTER — Other Ambulatory Visit: Payer: Self-pay

## 2019-01-12 DIAGNOSIS — Z171 Estrogen receptor negative status [ER-]: Secondary | ICD-10-CM | POA: Diagnosis not present

## 2019-01-12 DIAGNOSIS — C50512 Malignant neoplasm of lower-outer quadrant of left female breast: Secondary | ICD-10-CM | POA: Diagnosis not present

## 2019-01-12 DIAGNOSIS — Z510 Encounter for antineoplastic radiation therapy: Secondary | ICD-10-CM | POA: Diagnosis not present

## 2019-01-15 ENCOUNTER — Ambulatory Visit
Admission: RE | Admit: 2019-01-15 | Discharge: 2019-01-15 | Disposition: A | Discharge status: Home / Self Care | Payer: Medicare HMO | Source: Ambulatory Visit | Attending: Radiation Oncology | Admitting: Radiation Oncology

## 2019-01-15 ENCOUNTER — Other Ambulatory Visit: Payer: Self-pay | Admitting: Radiation Oncology

## 2019-01-15 ENCOUNTER — Telehealth: Payer: Self-pay | Admitting: Hematology

## 2019-01-15 ENCOUNTER — Other Ambulatory Visit: Payer: Self-pay

## 2019-01-15 DIAGNOSIS — Z510 Encounter for antineoplastic radiation therapy: Secondary | ICD-10-CM | POA: Diagnosis not present

## 2019-01-15 DIAGNOSIS — Z171 Estrogen receptor negative status [ER-]: Secondary | ICD-10-CM | POA: Diagnosis not present

## 2019-01-15 DIAGNOSIS — C50512 Malignant neoplasm of lower-outer quadrant of left female breast: Secondary | ICD-10-CM | POA: Diagnosis not present

## 2019-01-15 MED ORDER — HYDROCODONE-ACETAMINOPHEN 5-325 MG PO TABS: 1.0000 | ORAL_TABLET | Freq: Four times a day (QID) | ORAL | 0 refills | Status: DC | PRN

## 2019-01-15 MED FILL — HYDROCODON-APAP 5-325: 5-325 | 7 days supply | Qty: 60 | Fill #0

## 2019-01-15 NOTE — Progress Notes (Signed)
I saw the patient via webex due to skin concerns. She's just started her boost but has large pendulous breasts, and her left breast is full and she has moist desquamation along the inframammary fold and base of the left axilla. She did not tolerate sonafine or radiaplex, and we've discussed neosporin at night time in these sites, and hydrogel pads during the day. She was given a new rx for hydrocodone/apap as well. She will finish treatment this week. We also discussed trying to elevate the breast with ace wrap or compression camisole.

## 2019-01-15 NOTE — Progress Notes (Signed)
[place]  [phone] [phone]    ID: Gloria Mccoy DOB: Feb 03, 1946  MR#: [id number]  JME#:[id number]  Patient Care Team: Redmond School, MD as PCP - General (Internal Medicine) Harl Bowie, Alphonse Guild, MD as PCP - Cardiology (Cardiology) Yair Dusza, Virgie Dad, MD as Consulting Physician (Oncology) Kyung Rudd, MD as Consulting Physician (Radiation Oncology) Rutherford Guys, MD as Consulting Physician (Ophthalmology) Jovita Kussmaul, MD as Consulting Physician (General Surgery) Mauro Kaufmann, RN as Oncology Nurse Navigator Rockwell Germany, RN as Oncology Nurse Navigator OTHER MD:    CHIEF COMPLAINT: Triple negative breast cancer  CURRENT TREATMENT: Adjuvant chemotherapy, adjuvant radiation   INTERVAL HISTORY: Gloria Mccoy was seen today for follow-up and treatment of her triple negative breast cancer.   She is currently undergoing radiation treatments and is scheduled to finish this Friday, 01/19/2019. She reports fatigue and severe pain. She takes oxycodone for the pain, but she notes this constipates her.  It also makes her sleepy.  She also takes Aleve.  She continues on adjuvant chemotherapy consisting of cyclophosphamide, methotrexate, and fluorouracil (CMF), repeated every 21 days x 8. Methotrexate is being held as she undergoes radiation treatments. Because of her severe pain, we are holding her treatment today.  She underwent port placement on 12/29/2018.  This was uneventful  Since her last visit, she was tested for Covid-19 on 01/03/2019. Fortunately, her results were negative.   REVIEW OF SYSTEMS: Gloria Mccoy reports at home is her husband and daughter. Her daughter takes care of them, but Clark states she she does her own shopping.  A detailed review of systems was otherwise entirely negative.   HISTORY OF CURRENT ILLNESS: From the original intake note:  Gloria Mccoy had routine screening mammography on 07/12/2018 showing a possible  abnormality in the left breast. She underwent unilateral left diagnostic mammography with tomography and left breast ultrasonography at [facility] on 07/25/2018 showing: Breast Density Category B. There is a mass with indistinct margins in the lower inner posterior left breast measuring approximately 0.6 cm. Targeted ultrasound of the lower left breast was performed. There is a hypoechoic mass with margin irregularity in the left breast at 7 o'clock, 7 cm from the nipple measuring 0.7 x 0.5 x 0.6 cm. No lymphadenopathy seen in the left axilla.   Accordingly on 08/01/2018 she proceeded to biopsy of the left breast area in question. The pathology from this procedure showed ([id number]): invasive ductal carcinoma, grade II. Prognostic indicators significant for: estrogen receptor, 0% negative and progesterone receptor, 0% negative. Proliferation marker Ki67 at 40%. HER2 negative (1+) by immunohistochemistry.   The patient's subsequent history is as detailed below.   PAST MEDICAL HISTORY: Past Medical History:  Diagnosis Date  . Arthritis    "hands sometimes" (07/13/2017)  . Coronary artery disease    a. s/p CABG x3 in 06/2017 with LIMA-LAD, SVG-D1, and SVG-RI.  b. 10/2017: cath showing a widely patent LIMA-LAD with ostial occlusion of the SVG-RI and subtotally occluded atretic SVG-D1. Graft occlusion thought to be 2ry to improvement in pre-CABG stenoses.   . Essential hypertension   . Family history of adverse reaction to anesthesia    sister had a complication that was stated she had a "foggy" episode after her breast surgery was readmitted 1 day post op after being discharged from the hospital. Sister also has significant lung problems that could have contributed to this   . Family history of breast cancer   . Family history of colon cancer   .  Hypothyroid   . Meniere disease   . Myocardial infarction ([place]) 06/2017   during cardaic rehab  . Obesity   . Osteopenia 01/2012   T score -1.3  FRAX 7.9%/0.6%     PAST SURGICAL HISTORY: Past Surgical History:  Procedure Laterality Date  . BREAST LUMPECTOMY WITH RADIOACTIVE SEED AND SENTINEL LYMPH NODE BIOPSY Left 09/01/2018   Procedure: LEFT BREAST LUMPECTOMY WITH RADIOACTIVE SEED AND SENTINEL LYMPH NODE BIOPSY;  Surgeon: Jovita Kussmaul, MD;  Location: [facility];  Service: General;  Laterality: Left;  . BREAST SURGERY    . CARDIAC CATHETERIZATION  07/13/2017  . CATARACT EXTRACTION W/PHACO Right 01/28/2015   Procedure: CATARACT EXTRACTION PHACO AND INTRAOCULAR LENS PLACEMENT :  CDE:  5.70;  Surgeon: Rutherford Guys, MD;  Location: [facility];  Service: Ophthalmology;  Laterality: Right;  . CATARACT EXTRACTION W/PHACO Left 02/11/2015   Procedure: CATARACT EXTRACTION PHACO AND INTRAOCULAR LENS PLACEMENT (IOC);  Surgeon: Rutherford Guys, MD;  Location: [facility];  Service: Ophthalmology;  Laterality: Left;  CDE: 7.38  . COLONOSCOPY N/A 09/26/2012   Procedure: COLONOSCOPY;  Surgeon: Jamesetta So, MD;  [facility];  Service: Gastroenterology;  Laterality: N/A;  . CORONARY ARTERY BYPASS GRAFT N/A 07/15/2017   Procedure: CORONARY ARTERY BYPASS GRAFTING (CABG) X 3 USING LEFT INTERNAL MAMMARY ARTERY AND RIGHT SAPHENOUS VEIN- ENDOSCOPICALLY HARVESTED;  Surgeon: Melrose Nakayama, MD;  Location: [facility];  Service: Open Heart Surgery;  Laterality: N/A;  . IR IMAGING GUIDED PORT INSERTION  12/29/2018  . LEFT HEART CATH AND CORONARY ANGIOGRAPHY N/A 07/13/2017   Procedure: LEFT HEART CATH AND CORONARY ANGIOGRAPHY;  Surgeon: Martinique, Peter M, MD;  Location: [facility];  Service: Cardiovascular;  Laterality: N/A;  . LEFT HEART CATH AND CORS/GRAFTS ANGIOGRAPHY N/A 11/03/2017   Procedure: LEFT HEART CATH AND CORS/GRAFTS ANGIOGRAPHY;  Surgeon: Troy Sine, MD;  Location: [facility];  Service: Cardiovascular;  Laterality: N/A;  . TEE WITHOUT CARDIOVERSION N/A 07/15/2017   Procedure: TRANSESOPHAGEAL ECHOCARDIOGRAM (TEE);  Surgeon: Melrose Nakayama, MD;  Location: [facility];  Service: Open Heart Surgery;  Laterality: N/A;  . TUBAL LIGATION    . TYMPANOPLASTY Left    fluid from ear drum     FAMILY HISTORY: Family History  Problem Relation Age of Onset  . Diabetes Sister        AODM  . Heart disease Sister 56       CABG  . Breast cancer Sister   . Heart disease Brother 61       In his 25s  . Colon cancer Maternal Uncle 68  . Diabetes Sister        AODM  . Hypertension Daughter   . Hypertension Daughter   . Breast cancer Cousin        PATERNAL COUSIN  . Heart attack Father        In his 54s  . Stroke Mother   . Stroke Maternal Grandmother   . Diabetes Maternal Grandfather        d. 109  . Heart attack Paternal Grandmother   . Colon cancer Maternal Uncle 9  . Breast cancer Cousin        dx in her 39s; mat first cousin  . Breast cancer Cousin        dx 6s; d. 56s  . Breast cancer Niece 56   Carle's father died from a myocardial infarction at age 70. Patients' mother died from a stroke at age  73. The patient has 3 brothers and 9 sisters. One of Gloria Mccoy's sisters, [name], was diagnosed with breast cancer at the age of 71. Alyviah also has a niece that was diagnosed with breast cancer at 39, and two cousins that were diagnosed with breast cancer.  She believes 1 of her brothers may have prostate cancer.  Patient denies anyone in her family having ovarian or pancreatic cancer. Sotiria has an uncle that was diagnosed with colon cancer and an uncle that was diagnosed with "stomach" cancer.    GYNECOLOGIC HISTORY:  No LMP recorded. Patient is postmenopausal. 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 Her husband, Gloria Mccoy, is a retired Administrator. They have two daughters. Their daughter, Gloria Mccoy, lives in [place] and is a housewife.  Their other  daughter works at [employer]. Cathaleen had one grandchild that is now deceased. She attends a [employer].    ADVANCED DIRECTIVES: Terisha's husband, Gloria Mccoy, is automatically her healthcare power of attorney.     HEALTH MAINTENANCE: Social History   Tobacco Use  . Smoking status: Never Smoker  . Smokeless tobacco: Former Systems developer    Types: Snuff  Substance Use Topics  . Alcohol use: Yes    Alcohol/week: 1.0 standard drinks    Types: 1 Standard drinks or equivalent per week    Comment: occasionally  . Drug use: No    Colonoscopy: yes, Dr. Arnoldo Morale  PAP:   Bone density: yes, 2016, -1.1 osteopenic   Allergies  Allergen Reactions  . Aloe Vera Itching    Per patient severe itching  . Sulfa Antibiotics Shortness Of Breath  . Sulfasalazine Shortness Of Breath    Current Outpatient Medications  Medication Sig Dispense Refill  . acetaminophen (TYLENOL) 325 MG tablet Take 2 tablets (650 mg total) by mouth every 6 (six) hours as needed for mild pain.    Marland Kitchen aspirin EC 81 MG tablet Take 81 mg by mouth daily.    Marland Kitchen atorvastatin (LIPITOR) 80 MG tablet Take 1 tablet (80 mg total) by mouth daily at 6 PM. 90 tablet 3  . Cholecalciferol (VITAMIN D PO) Take 1 tablet by mouth daily.     . clopidogrel (PLAVIX) 75 MG tablet Take 1 tablet by mouth once daily 90 tablet 3  . Cyanocobalamin (VITAMIN B-12 PO) Take 1 tablet by mouth daily.    Marland Kitchen dexamethasone (DECADRON) 4 MG tablet Take 1 tablet (4 mg total) by mouth daily. Start the day after chemotherapy for 2 days. Take with food. 30 tablet 1  . fish oil-omega-3 fatty acids 1000 MG capsule Take 1 g by mouth daily.    . furosemide (LASIX) 40 MG tablet Take 1 tablet (40 mg total) by mouth daily. 30 tablet 1  . HYDROcodone-acetaminophen (NORCO/VICODIN) 5-325 MG tablet Take 1-2 tablets by mouth every 6 (six) hours as needed for moderate pain or severe pain. 60 tablet 0  . levothyroxine (SYNTHROID) 25 MCG tablet     . levothyroxine (SYNTHROID,  LEVOTHROID) 75 MCG tablet Take 75 mcg by mouth daily.    Marland Kitchen lidocaine-prilocaine (EMLA) cream Apply 1 application topically as needed. 30 g 0  . losartan (COZAAR) 25 MG tablet Take 0.5 tablets (12.5 mg total) by mouth daily. 45 tablet 3  . meloxicam (MOBIC) 15 MG tablet Take 15 mg by  mouth daily.    . metoprolol tartrate (LOPRESSOR) 25 MG tablet Take 1 tablet (25 mg total) by mouth 2 (two) times daily. 180 tablet 3  . prochlorperazine (COMPAZINE) 10 MG tablet Take 1 tablet (10 mg total) by mouth 3 (three) times daily before meals. 30 tablet 1  . VITAMIN E PO Take 1 tablet by mouth daily.      No current facility-administered medications for this visit.      OBJECTIVE: Obese African-American woman who appears stated age Vitals:   01/16/19 0903  BP: 137/62  Pulse: 60  Resp: 18  Temp: 98.5 F (36.9 C)  SpO2: 95%     Body mass index is 38.32 kg/m.   Wt Readings from Last 3 Encounters:  01/16/19 209 lb 8 oz (95 kg)  12/26/18 207 lb (93.9 kg)  12/05/18 205 lb 14.4 oz (93.4 kg)  ECOG FS:1 - Symptomatic but completely ambulatory  Sclerae unicteric, EOMs intact No cervical or supraclavicular adenopathy Lungs no rales or rhonchi Heart regular rate and rhythm Abd soft, nontender, positive bowel sounds MSK no focal spinal tenderness, no upper extremity lymphedema Neuro: nonfocal, well oriented, appropriate affect Breasts: The right breast is unremarkable.  The left breast is currently undergoing radiation.  There is significant hyperpigmentation.  There is fairly severe wet desquamation particularly in the inframammary fold and dry desquamation in the left axilla.   LAB RESULTS:  CMP     Component Value Date/Time   NA 143 12/26/2018 1118   K 3.8 12/26/2018 1118   CL 108 12/26/2018 1118   CO2 27 12/26/2018 1118   GLUCOSE 98 12/26/2018 1118   BUN 7 (L) 12/26/2018 1118   CREATININE 0.89 12/26/2018 1118   CALCIUM 9.0 12/26/2018 1118   PROT 7.3 12/26/2018 1118   ALBUMIN 3.6  12/26/2018 1118   AST 18 12/26/2018 1118   ALT 12 12/26/2018 1118   ALKPHOS 77 12/26/2018 1118   BILITOT 0.7 12/26/2018 1118   GFRNONAA >60 12/26/2018 1118   GFRAA >60 12/26/2018 1118    No results found for: TOTALPROTELP, ALBUMINELP, A1GS, A2GS, BETS, BETA2SER, GAMS, MSPIKE, SPEI  No results found for: KPAFRELGTCHN, LAMBDASER, KAPLAMBRATIO  Lab Results  Component Value Date   WBC 7.2 01/16/2019   NEUTROABS 3.5 01/16/2019   HGB 11.7 (L) 01/16/2019   HCT 33.5 (L) 01/16/2019   MCV 85.0 01/16/2019   PLT 244 01/16/2019    '@LASTCHEMISTRY' @  No results found for: LABCA2  No components found for: WIOXBD532  No results for input(s): INR in the last 168 hours.  No results found for: LABCA2  No results found for: DJM426  No results found for: STM196  No results found for: QIW979  No results found for: CA2729  No components found for: HGQUANT  No results found for: CEA1 / No results found for: CEA1   No results found for: AFPTUMOR  No results found for: CHROMOGRNA  No results found for: PSA1  Appointment on 01/16/2019  Component Date Value Ref Range Status  . WBC 01/16/2019 7.2  4.0 - 10.5 K/uL Final  . RBC 01/16/2019 3.94  3.87 - 5.11 MIL/uL Final  . Hemoglobin 01/16/2019 11.7* 12.0 - 15.0 g/dL Final  . HCT 01/16/2019 33.5* 36.0 - 46.0 % Final  . MCV 01/16/2019 85.0  80.0 - 100.0 fL Final  . MCH 01/16/2019 29.7  26.0 - 34.0 pg Final  . MCHC 01/16/2019 34.9  30.0 - 36.0 g/dL Final  . RDW 01/16/2019 14.3  11.5 - 15.5 %  Final  . Platelets 01/16/2019 244  150 - 400 K/uL Final  . nRBC 01/16/2019 0.0  0.0 - 0.2 % Final  . Neutrophils Relative % 01/16/2019 49  % Final  . Neutro Abs 01/16/2019 3.5  1.7 - 7.7 K/uL Final  . Lymphocytes Relative 01/16/2019 21  % Final  . Lymphs Abs 01/16/2019 1.5  0.7 - 4.0 K/uL Final  . Monocytes Relative 01/16/2019 22  % Final  . Monocytes Absolute 01/16/2019 1.6* 0.1 - 1.0 K/uL Final  . Eosinophils Relative 01/16/2019 6  % Final  .  Eosinophils Absolute 01/16/2019 0.4  0.0 - 0.5 K/uL Final  . Basophils Relative 01/16/2019 1  % Final  . Basophils Absolute 01/16/2019 0.1  0.0 - 0.1 K/uL Final  . Immature Granulocytes 01/16/2019 1  % Final  . Abs Immature Granulocytes 01/16/2019 0.09* 0.00 - 0.07 K/uL Final   Performed at [facility], [address] [place],  [address]    (this displays the last labs from the last 3 days)  No results found for: TOTALPROTELP, ALBUMINELP, A1GS, A2GS, BETS, BETA2SER, GAMS, MSPIKE, SPEI (this displays SPEP labs)  No results found for: KPAFRELGTCHN, LAMBDASER, KAPLAMBRATIO (kappa/lambda light chains)  No results found for: HGBA, HGBA2QUANT, HGBFQUANT, HGBSQUAN (Hemoglobinopathy evaluation)   No results found for: LDH  No results found for: IRON, TIBC, IRONPCTSAT (Iron and TIBC)  No results found for: FERRITIN  Urinalysis    Component Value Date/Time   COLORURINE YELLOW 07/14/2017 2015   APPEARANCEUR HAZY (A) 07/14/2017 2015   LABSPEC 1.011 07/14/2017 2015   PHURINE 6.0 07/14/2017 2015   GLUCOSEU NEGATIVE 07/14/2017 2015   [place] 07/14/2017 2015   [place] 07/14/2017 2015   KETONESUR NEGATIVE 07/14/2017 2015   PROTEINUR NEGATIVE 07/14/2017 2015   UROBILINOGEN 0.2 07/03/2014 0852   NITRITE NEGATIVE 07/14/2017 2015   LEUKOCYTESUR LARGE (A) 07/14/2017 2015     STUDIES:  Ir Imaging Guided Port Insertion  Result Date: 12/29/2018 INDICATION: 73 year old female with a history of left-sided breast cancer now with multifocal metastatic disease. She presents for port catheter placement for chemotherapy. EXAM: IMPLANTED PORT A CATH PLACEMENT WITH ULTRASOUND AND FLUOROSCOPIC GUIDANCE MEDICATIONS: 2 g Ancef; The antibiotic was administered within an appropriate time interval prior to skin puncture. ANESTHESIA/SEDATION: Versed 2 mg IV; Fentanyl 100 mcg IV; Moderate Sedation Time:  20 minutes The patient was continuously monitored  during the procedure by the interventional radiology nurse under my direct supervision. FLUOROSCOPY TIME:  0 minutes, 48 seconds (13 mGy) COMPLICATIONS: None immediate. PROCEDURE: The right neck and chest was prepped with chlorhexidine, and draped in the usual sterile fashion using maximum barrier technique (cap and mask, sterile gown, sterile gloves, large sterile sheet, hand hygiene and cutaneous antiseptic). Local anesthesia was attained by infiltration with 1% lidocaine with epinephrine. Ultrasound demonstrated patency of the right internal jugular vein, and this was documented with an image. Under real-time ultrasound guidance, this vein was accessed with a 21 gauge micropuncture needle and image documentation was performed. A small dermatotomy was made at the access site with an 11 scalpel. A 0.018" wire was advanced into the SVC and the access needle exchanged for a 68F micropuncture vascular sheath. The 0.018" wire was then removed and a 0.035" wire advanced into the IVC. An appropriate location for the subcutaneous reservoir was selected below the clavicle and an incision was made through the skin and underlying soft tissues. The subcutaneous tissues were then dissected using a combination of blunt and  sharp surgical technique and a pocket was formed. A single lumen power injectable portacatheter was then tunneled through the subcutaneous tissues from the pocket to the dermatotomy and the port reservoir placed within the subcutaneous pocket. The venous access site was then serially dilated and a peel away vascular sheath placed over the wire. The wire was removed and the port catheter advanced into position under fluoroscopic guidance. The catheter tip is positioned in the superior cavoatrial junction. This was documented with a spot image. The portacatheter was then tested and found to flush and aspirate well. The port was flushed with saline followed by 100 units/mL heparinized saline. The pocket was then  closed in two layers using first subdermal inverted interrupted absorbable sutures followed by a running subcuticular suture. The epidermis was then sealed with Dermabond. The dermatotomy at the venous access site was also closed with Dermabond. IMPRESSION: Successful placement of a right IJ approach Power Port with ultrasound and fluoroscopic guidance. The catheter is ready for use. Electronically Signed   By: Jacqulynn Cadet M.D.   On: 12/29/2018 15:22     ELIGIBLE FOR AVAILABLE RESEARCH PROTOCOL: no   ASSESSMENT: 73 y.o. Gloria Mccoy, [place] woman status post left breast lower inner quadrant biopsy 08/01/2018 for a clinical T1c N0, stage IB invasive ductal carcinoma, grade 2, triple negative, with an MIB-1 of 40%  (1) genetics testing 08/29/2018 through with Hereditary Gene Panel offered by [facility] found no deleterious mutations in APC, ATM, AXIN2, BARD1, BMPR1A, BRCA1, BRCA2, BRIP1, CDH1, CDK4, CDKN2A (p14ARF), CDKN2A (p16INK4a), CHEK2, CTNNA1, DICER1, EPCAM (Deletion/duplication testing only), GREM1 (promoter region deletion/duplication testing only), KIT, MEN1, MLH1, MSH2, MSH3, MSH6, MUTYH, NBN, NF1, NHTL1, PALB2, PDGFRA, PMS2, POLD1, POLE, PTEN, RAD50, RAD51C, RAD51D, SDHB, SDHC, SDHD, SMAD4, SMARCA4. STK11, TP53, TSC1, TSC2, and VHL.  The following genes were evaluated for sequence changes only: SDHA and HOXB13 c.251G>A variant only.   (a) MLH1 c.1890T>G VUS identified  (2) status post left lumpectomy with sentinel lymph node sampling 09/01/2022 a pT1c pN0, stage IB invasive ductal carcinoma, grade 2, with negative margins.  (a) a total of 3 sentinel lymph nodes were removed  (3) chemotherapy consisting of cyclophosphamide, methotrexate, and fluorouracil (CMF), repeated every 21 days x 8, starting 10/24/2018  (4) adjuvant radiation given concurrently with cycles 3-4-5 of chemotherapy   PLAN: Elody is having a rough time with her radiation treatments.  I do not think we can proceed to  chemotherapy today given her wet desquamation.  Dr. Sondra Come dropped by yesterday to alert me regarding that.  Accordingly we are postponing her CMF treatment from today to 01/25/2019.  I have suggested that she use the creams that they provide her downstairs.  She says she is not using the one cream she was given because it has aloe vera and she is allergic to that.  From the pain she can continue to use Tylenol, Aleve, and oxycodone as prescribed.  I am going to not add back the methotrexate to her treatment until cycle 6, which will be postponed of course to August 27.  She knows to call for any other issue that may develop before then.  Virgie Dad. Plumer Mittelstaedt, MD  01/16/19 9:12 AM Medical Oncology and Hematology [facility] [address] [place], [place] [address] Tel. [phone]    Fax. [phone]    I, Wilburn Mylar, am acting as scribe for Dr. Virgie Dad. Milka Windholz.  Lindie Spruce MD, have reviewed the above documentation for accuracy and completeness, and I  agree with the above.

## 2019-01-15 NOTE — Telephone Encounter (Signed)
Pt is aware covid 19 test is negative °

## 2019-01-16 ENCOUNTER — Other Ambulatory Visit: Payer: Medicare HMO

## 2019-01-16 ENCOUNTER — Inpatient Hospital Stay: Payer: Medicare HMO

## 2019-01-16 ENCOUNTER — Other Ambulatory Visit: Payer: Self-pay

## 2019-01-16 ENCOUNTER — Ambulatory Visit: Payer: Medicare HMO

## 2019-01-16 ENCOUNTER — Inpatient Hospital Stay (HOSPITAL_BASED_OUTPATIENT_CLINIC_OR_DEPARTMENT_OTHER): Payer: Medicare HMO | Admitting: Oncology

## 2019-01-16 ENCOUNTER — Ambulatory Visit
Admission: RE | Admit: 2019-01-16 | Discharge: 2019-01-16 | Disposition: A | Discharge status: Home / Self Care | Payer: Medicare HMO | Source: Ambulatory Visit | Attending: Radiation Oncology | Admitting: Radiation Oncology

## 2019-01-16 VITALS — BP 137/62 | HR 60 | Temp 98.5°F | Resp 18 | Ht 62.0 in | Wt 209.5 lb

## 2019-01-16 DIAGNOSIS — Z171 Estrogen receptor negative status [ER-]: Secondary | ICD-10-CM | POA: Diagnosis not present

## 2019-01-16 DIAGNOSIS — Z510 Encounter for antineoplastic radiation therapy: Secondary | ICD-10-CM | POA: Diagnosis not present

## 2019-01-16 DIAGNOSIS — Z803 Family history of malignant neoplasm of breast: Secondary | ICD-10-CM

## 2019-01-16 DIAGNOSIS — Z7982 Long term (current) use of aspirin: Secondary | ICD-10-CM | POA: Diagnosis not present

## 2019-01-16 DIAGNOSIS — I10 Essential (primary) hypertension: Secondary | ICD-10-CM

## 2019-01-16 DIAGNOSIS — R5383 Other fatigue: Secondary | ICD-10-CM | POA: Diagnosis not present

## 2019-01-16 DIAGNOSIS — G8929 Other chronic pain: Secondary | ICD-10-CM | POA: Diagnosis not present

## 2019-01-16 DIAGNOSIS — C50512 Malignant neoplasm of lower-outer quadrant of left female breast: Secondary | ICD-10-CM

## 2019-01-16 DIAGNOSIS — E039 Hypothyroidism, unspecified: Secondary | ICD-10-CM

## 2019-01-16 DIAGNOSIS — Z8249 Family history of ischemic heart disease and other diseases of the circulatory system: Secondary | ICD-10-CM

## 2019-01-16 DIAGNOSIS — Z951 Presence of aortocoronary bypass graft: Secondary | ICD-10-CM

## 2019-01-16 DIAGNOSIS — Z95828 Presence of other vascular implants and grafts: Secondary | ICD-10-CM | POA: Insufficient documentation

## 2019-01-16 DIAGNOSIS — Z791 Long term (current) use of non-steroidal anti-inflammatories (NSAID): Secondary | ICD-10-CM

## 2019-01-16 DIAGNOSIS — Z79899 Other long term (current) drug therapy: Secondary | ICD-10-CM

## 2019-01-16 LAB — CBC WITH DIFFERENTIAL/PLATELET
Abs Immature Granulocytes: 0.09 10*3/uL — ABNORMAL HIGH (ref 0.00–0.07)
Basophils Absolute: 0.1 10*3/uL (ref 0.0–0.1)
Basophils Relative: 1 %
Eosinophils Absolute: 0.4 10*3/uL (ref 0.0–0.5)
Eosinophils Relative: 6 %
HCT: 33.5 % — ABNORMAL LOW (ref 36.0–46.0)
Hemoglobin: 11.7 g/dL — ABNORMAL LOW (ref 12.0–15.0)
Immature Granulocytes: 1 %
Lymphocytes Relative: 21 %
Lymphs Abs: 1.5 10*3/uL (ref 0.7–4.0)
MCH: 29.7 pg (ref 26.0–34.0)
MCHC: 34.9 g/dL (ref 30.0–36.0)
MCV: 85 fL (ref 80.0–100.0)
Monocytes Absolute: 1.6 10*3/uL — ABNORMAL HIGH (ref 0.1–1.0)
Monocytes Relative: 22 %
Neutro Abs: 3.5 10*3/uL (ref 1.7–7.7)
Neutrophils Relative %: 49 %
Platelets: 244 10*3/uL (ref 150–400)
RBC: 3.94 MIL/uL (ref 3.87–5.11)
RDW: 14.3 % (ref 11.5–15.5)
WBC: 7.2 10*3/uL (ref 4.0–10.5)
nRBC: 0 % (ref 0.0–0.2)

## 2019-01-16 LAB — CMP (CANCER CENTER ONLY)
ALT: 10 U/L (ref 0–44)
AST: 22 U/L (ref 15–41)
Albumin: 3.2 g/dL — ABNORMAL LOW (ref 3.5–5.0)
Alkaline Phosphatase: 70 U/L (ref 38–126)
Anion gap: 9 (ref 5–15)
BUN: 10 mg/dL (ref 8–23)
CO2: 26 mmol/L (ref 22–32)
Calcium: 9 mg/dL (ref 8.9–10.3)
Chloride: 106 mmol/L (ref 98–111)
Creatinine: 0.78 mg/dL (ref 0.44–1.00)
GFR, Est AFR Am: >60 mL/min (ref >60)
GFR, Estimated: >60 mL/min (ref >60)
Glucose, Bld: 105 mg/dL — ABNORMAL HIGH (ref 70–99)
Potassium: 3.9 mmol/L (ref 3.5–5.1)
Sodium: 141 mmol/L (ref 135–145)
Total Bilirubin: 0.7 mg/dL (ref 0.3–1.2)
Total Protein: 6.7 g/dL (ref 6.5–8.1)

## 2019-01-16 MED ORDER — SODIUM CHLORIDE 0.9% FLUSH
10.0000 mL | Freq: Once | INTRAVENOUS | Status: AC
  Administered 2019-01-16: 10 mL
  Filled 2019-01-16: qty 10

## 2019-01-17 ENCOUNTER — Other Ambulatory Visit: Payer: Self-pay

## 2019-01-17 ENCOUNTER — Ambulatory Visit
Admission: RE | Admit: 2019-01-17 | Discharge: 2019-01-17 | Disposition: A | Discharge status: Home / Self Care | Payer: Medicare HMO | Source: Ambulatory Visit | Attending: Radiation Oncology | Admitting: Radiation Oncology

## 2019-01-17 DIAGNOSIS — C50512 Malignant neoplasm of lower-outer quadrant of left female breast: Secondary | ICD-10-CM | POA: Diagnosis not present

## 2019-01-17 DIAGNOSIS — Z510 Encounter for antineoplastic radiation therapy: Secondary | ICD-10-CM | POA: Diagnosis not present

## 2019-01-17 DIAGNOSIS — Z171 Estrogen receptor negative status [ER-]: Secondary | ICD-10-CM | POA: Diagnosis not present

## 2019-01-18 ENCOUNTER — Ambulatory Visit
Admission: RE | Admit: 2019-01-18 | Discharge: 2019-01-18 | Disposition: A | Discharge status: Home / Self Care | Payer: Medicare HMO | Source: Ambulatory Visit | Attending: Radiation Oncology | Admitting: Radiation Oncology

## 2019-01-18 ENCOUNTER — Other Ambulatory Visit: Payer: Self-pay

## 2019-01-18 DIAGNOSIS — C50512 Malignant neoplasm of lower-outer quadrant of left female breast: Secondary | ICD-10-CM | POA: Diagnosis not present

## 2019-01-18 DIAGNOSIS — Z171 Estrogen receptor negative status [ER-]: Secondary | ICD-10-CM | POA: Diagnosis not present

## 2019-01-18 DIAGNOSIS — Z510 Encounter for antineoplastic radiation therapy: Secondary | ICD-10-CM | POA: Diagnosis not present

## 2019-01-19 ENCOUNTER — Ambulatory Visit
Admission: RE | Admit: 2019-01-19 | Discharge: 2019-01-19 | Disposition: A | Discharge status: Home / Self Care | Payer: Medicare HMO | Source: Ambulatory Visit | Attending: Radiation Oncology | Admitting: Radiation Oncology

## 2019-01-19 ENCOUNTER — Other Ambulatory Visit: Payer: Self-pay

## 2019-01-19 ENCOUNTER — Encounter: Payer: Self-pay | Admitting: Radiation Oncology

## 2019-01-19 DIAGNOSIS — Z171 Estrogen receptor negative status [ER-]: Secondary | ICD-10-CM | POA: Diagnosis not present

## 2019-01-19 DIAGNOSIS — C50512 Malignant neoplasm of lower-outer quadrant of left female breast: Secondary | ICD-10-CM | POA: Diagnosis not present

## 2019-01-19 DIAGNOSIS — Z510 Encounter for antineoplastic radiation therapy: Secondary | ICD-10-CM | POA: Diagnosis not present

## 2019-01-24 ENCOUNTER — Other Ambulatory Visit: Payer: Self-pay

## 2019-01-24 ENCOUNTER — Inpatient Hospital Stay: Payer: Medicare HMO

## 2019-01-24 ENCOUNTER — Inpatient Hospital Stay (HOSPITAL_BASED_OUTPATIENT_CLINIC_OR_DEPARTMENT_OTHER): Payer: Medicare HMO | Admitting: Adult Health

## 2019-01-24 ENCOUNTER — Encounter: Payer: Self-pay | Admitting: Adult Health

## 2019-01-24 ENCOUNTER — Inpatient Hospital Stay: Payer: Medicare HMO | Attending: Genetic Counselor

## 2019-01-24 VITALS — Temp 98.0°F

## 2019-01-24 VITALS — BP 117/56 | HR 60 | Resp 18 | Ht 62.0 in | Wt 209.5 lb

## 2019-01-24 DIAGNOSIS — M1990 Unspecified osteoarthritis, unspecified site: Secondary | ICD-10-CM | POA: Diagnosis not present

## 2019-01-24 DIAGNOSIS — C50512 Malignant neoplasm of lower-outer quadrant of left female breast: Secondary | ICD-10-CM

## 2019-01-24 DIAGNOSIS — Z833 Family history of diabetes mellitus: Secondary | ICD-10-CM | POA: Diagnosis not present

## 2019-01-24 DIAGNOSIS — Z951 Presence of aortocoronary bypass graft: Secondary | ICD-10-CM | POA: Diagnosis not present

## 2019-01-24 DIAGNOSIS — Z171 Estrogen receptor negative status [ER-]: Secondary | ICD-10-CM

## 2019-01-24 DIAGNOSIS — R600 Localized edema: Secondary | ICD-10-CM | POA: Diagnosis not present

## 2019-01-24 DIAGNOSIS — Z8249 Family history of ischemic heart disease and other diseases of the circulatory system: Secondary | ICD-10-CM | POA: Insufficient documentation

## 2019-01-24 DIAGNOSIS — I10 Essential (primary) hypertension: Secondary | ICD-10-CM | POA: Diagnosis not present

## 2019-01-24 DIAGNOSIS — Z923 Personal history of irradiation: Secondary | ICD-10-CM | POA: Diagnosis not present

## 2019-01-24 DIAGNOSIS — R5383 Other fatigue: Secondary | ICD-10-CM | POA: Insufficient documentation

## 2019-01-24 DIAGNOSIS — Z79899 Other long term (current) drug therapy: Secondary | ICD-10-CM | POA: Diagnosis not present

## 2019-01-24 DIAGNOSIS — M7989 Other specified soft tissue disorders: Secondary | ICD-10-CM | POA: Insufficient documentation

## 2019-01-24 DIAGNOSIS — C50312 Malignant neoplasm of lower-inner quadrant of left female breast: Secondary | ICD-10-CM | POA: Diagnosis not present

## 2019-01-24 DIAGNOSIS — I2510 Atherosclerotic heart disease of native coronary artery without angina pectoris: Secondary | ICD-10-CM | POA: Diagnosis not present

## 2019-01-24 DIAGNOSIS — Z5111 Encounter for antineoplastic chemotherapy: Secondary | ICD-10-CM | POA: Diagnosis present

## 2019-01-24 DIAGNOSIS — Z95828 Presence of other vascular implants and grafts: Secondary | ICD-10-CM

## 2019-01-24 DIAGNOSIS — N644 Mastodynia: Secondary | ICD-10-CM | POA: Diagnosis not present

## 2019-01-24 DIAGNOSIS — Z803 Family history of malignant neoplasm of breast: Secondary | ICD-10-CM | POA: Insufficient documentation

## 2019-01-24 DIAGNOSIS — I252 Old myocardial infarction: Secondary | ICD-10-CM | POA: Diagnosis not present

## 2019-01-24 DIAGNOSIS — Z882 Allergy status to sulfonamides status: Secondary | ICD-10-CM | POA: Diagnosis not present

## 2019-01-24 DIAGNOSIS — Z800 Family history of malignant neoplasm of digestive organs: Secondary | ICD-10-CM | POA: Insufficient documentation

## 2019-01-24 DIAGNOSIS — Z823 Family history of stroke: Secondary | ICD-10-CM | POA: Insufficient documentation

## 2019-01-24 LAB — CBC WITH DIFFERENTIAL/PLATELET
Abs Immature Granulocytes: 0.03 10*3/uL (ref 0.00–0.07)
Basophils Absolute: 0.1 10*3/uL (ref 0.0–0.1)
Basophils Relative: 1 %
Eosinophils Absolute: 0.3 10*3/uL (ref 0.0–0.5)
Eosinophils Relative: 4 %
HCT: 33.3 % — ABNORMAL LOW (ref 36.0–46.0)
Hemoglobin: 11.7 g/dL — ABNORMAL LOW (ref 12.0–15.0)
Immature Granulocytes: 0 %
Lymphocytes Relative: 22 %
Lymphs Abs: 1.6 10*3/uL (ref 0.7–4.0)
MCH: 30 pg (ref 26.0–34.0)
MCHC: 35.1 g/dL (ref 30.0–36.0)
MCV: 85.4 fL (ref 80.0–100.0)
Monocytes Absolute: 1.8 10*3/uL — ABNORMAL HIGH (ref 0.1–1.0)
Monocytes Relative: 25 %
Neutro Abs: 3.6 10*3/uL (ref 1.7–7.7)
Neutrophils Relative %: 48 %
Platelets: 187 10*3/uL (ref 150–400)
RBC: 3.9 MIL/uL (ref 3.87–5.11)
RDW: 14.8 % (ref 11.5–15.5)
WBC: 7.5 10*3/uL (ref 4.0–10.5)
nRBC: 0 % (ref 0.0–0.2)

## 2019-01-24 LAB — COMPREHENSIVE METABOLIC PANEL
ALT: 13 U/L (ref 0–44)
AST: 20 U/L (ref 15–41)
Albumin: 3.3 g/dL — ABNORMAL LOW (ref 3.5–5.0)
Alkaline Phosphatase: 68 U/L (ref 38–126)
Anion gap: 10 (ref 5–15)
BUN: 12 mg/dL (ref 8–23)
CO2: 25 mmol/L (ref 22–32)
Calcium: 8.9 mg/dL (ref 8.9–10.3)
Chloride: 106 mmol/L (ref 98–111)
Creatinine, Ser: 0.81 mg/dL (ref 0.44–1.00)
GFR calc Af Amer: >60 mL/min (ref >60)
GFR calc non Af Amer: >60 mL/min (ref >60)
Glucose, Bld: 111 mg/dL — ABNORMAL HIGH (ref 70–99)
Potassium: 4 mmol/L (ref 3.5–5.1)
Sodium: 141 mmol/L (ref 135–145)
Total Bilirubin: 0.6 mg/dL (ref 0.3–1.2)
Total Protein: 6.9 g/dL (ref 6.5–8.1)

## 2019-01-24 MED ORDER — SODIUM CHLORIDE 0.9% FLUSH
10.0000 mL | Freq: Once | INTRAVENOUS | Status: AC
  Administered 2019-01-24: 10 mL
  Filled 2019-01-24: qty 10

## 2019-01-24 MED ORDER — SODIUM CHLORIDE 0.9 % IV SOLN
600.0000 mg/m2 | Freq: Once | INTRAVENOUS | Status: AC
  Administered 2019-01-24: 1200 mg via INTRAVENOUS
  Filled 2019-01-24: qty 60

## 2019-01-24 MED ORDER — PALONOSETRON HCL INJECTION 0.25 MG/5ML
INTRAVENOUS | Status: AC
  Filled 2019-01-24: qty 5

## 2019-01-24 MED ORDER — DEXAMETHASONE SODIUM PHOSPHATE 10 MG/ML IJ SOLN
INTRAMUSCULAR | Status: AC
  Filled 2019-01-24: qty 1

## 2019-01-24 MED ORDER — FLUOROURACIL CHEMO INJECTION 2.5 GM/50ML
600.0000 mg/m2 | Freq: Once | INTRAVENOUS | Status: AC
  Administered 2019-01-24: 1200 mg via INTRAVENOUS
  Filled 2019-01-24: qty 24

## 2019-01-24 MED ORDER — SODIUM CHLORIDE 0.9 % IV SOLN
Freq: Once | INTRAVENOUS | Status: AC
  Administered 2019-01-24: 09:00:00 via INTRAVENOUS
  Filled 2019-01-24: qty 250

## 2019-01-24 MED ORDER — DEXAMETHASONE SODIUM PHOSPHATE 10 MG/ML IJ SOLN
10.0000 mg | Freq: Once | INTRAMUSCULAR | Status: AC
  Administered 2019-01-24: 10 mg via INTRAVENOUS

## 2019-01-24 MED ORDER — SODIUM CHLORIDE 0.9% FLUSH
10.0000 mL | INTRAVENOUS | Status: DC | PRN
  Administered 2019-01-24: 11:00:00 10 mL
  Filled 2019-01-24: qty 10

## 2019-01-24 MED ORDER — PALONOSETRON HCL INJECTION 0.25 MG/5ML
0.2500 mg | Freq: Once | INTRAVENOUS | Status: AC
  Administered 2019-01-24: 0.25 mg via INTRAVENOUS

## 2019-01-24 MED ORDER — HEPARIN SOD (PORK) LOCK FLUSH 100 UNIT/ML IV SOLN
500.0000 [IU] | Freq: Once | INTRAVENOUS | Status: AC | PRN
  Administered 2019-01-24: 11:00:00 500 [IU]
  Filled 2019-01-24: qty 5

## 2019-01-24 NOTE — Patient Instructions (Signed)
[facility] Discharge Instructions for Patients Receiving Chemotherapy  Today you received the following chemotherapy agents Cytoxan and Fluorouracil.  To help prevent nausea and vomiting after your treatment, we encourage you to take your nausea medication as directed.  If you develop nausea and vomiting that is not controlled by your nausea medication, call the clinic.   BELOW ARE SYMPTOMS THAT SHOULD BE REPORTED IMMEDIATELY:  *FEVER GREATER THAN 100.5 F  *CHILLS WITH OR WITHOUT FEVER  NAUSEA AND VOMITING THAT IS NOT CONTROLLED WITH YOUR NAUSEA MEDICATION  *UNUSUAL SHORTNESS OF BREATH  *UNUSUAL BRUISING OR BLEEDING  TENDERNESS IN MOUTH AND THROAT WITH OR WITHOUT PRESENCE OF ULCERS  *URINARY PROBLEMS  *BOWEL PROBLEMS  UNUSUAL RASH Items with * indicate a potential emergency and should be followed up as soon as possible.  Feel free to call the clinic should you have any questions or concerns. The clinic phone number is [phone].  Please show the [facility] at check-in to the Emergency Department and triage nurse.

## 2019-01-24 NOTE — Progress Notes (Signed)
[facility]  Telephone:([phone]) [phone] Fax:([phone]) [phone]    ID: BRENTLEE SCIARA DOB: 10/02/1945  MR#: [id number]  MEQ#:[id number]  Patient Care Team: Redmond School, MD as PCP - General (Internal Medicine) Harl Bowie, Alphonse Guild, MD as PCP - Cardiology (Cardiology) Magrinat, Virgie Dad, MD as Consulting Physician (Oncology) Kyung Rudd, MD as Consulting Physician (Radiation Oncology) Rutherford Guys, MD as Consulting Physician (Ophthalmology) Jovita Kussmaul, MD as Consulting Physician (General Surgery) Mauro Kaufmann, RN as Oncology Nurse Navigator Rockwell Germany, RN as Oncology Nurse Navigator OTHER MD:    CHIEF COMPLAINT: Triple negative breast cancer  CURRENT TREATMENT: Adjuvant chemotherapy, adjuvant radiation   INTERVAL HISTORY: Gloria Mccoy was seen today for follow-up and treatment of her triple negative breast cancer.   Gloria Mccoy completed radiation last week, on 7/31.  This had caused significant pain.  She says her skin is still peeling and she is applying the cream provided.  Her pain is about a 7, which is improved from prior.  She takes one Norco tablet per day.  She last received this on 7/27 #60 from radiation oncology.   She continues on adjuvant chemotherapy consisting of cyclophosphamide, methotrexate, and fluorouracil (CMF), repeated every 21 days x 8. Methotrexate is being held as she undergoes radiation treatments. Because of her pain, her treatment was delayed from last week, until today.    REVIEW OF SYSTEMS: Gloria Mccoy is feeling well otherwise.  She notes that her fatigue is improving.  She notes her legs are swelling, however, this has been chronic for her as Gloria Mccoy called me when she was undergoing port placement to tell me about her leg swelling several weeks ago.  Gloria Mccoy hasn't been walking as much as she previously did, but is planning on increasing her activity level.  She says her appetite is good and she is eating and drinking without any issues.     Gloria Mccoy denies fever or chills.  She is without chest pain, palpitations, cough, or shortness of breath.  She has no nausea, vomiting, bowel/bladder concerns, dysphagia.  A detailed ROS was otherwise non contributory.     HISTORY OF CURRENT ILLNESS: From the original intake note:  Gloria Mccoy had routine screening mammography on 07/12/2018 showing a possible abnormality in the left breast. She underwent unilateral left diagnostic mammography with tomography and left breast ultrasonography at [facility] on 07/25/2018 showing: Breast Density Category B. There is a mass with indistinct margins in the lower inner posterior left breast measuring approximately 0.6 cm. Targeted ultrasound of the lower left breast was performed. There is a hypoechoic mass with margin irregularity in the left breast at 7 o'clock, 7 cm from the nipple measuring 0.7 x 0.5 x 0.6 cm. No lymphadenopathy seen in the left axilla.   Accordingly on 08/01/2018 she proceeded to biopsy of the left breast area in question. The pathology from this procedure showed ([id number]): invasive ductal carcinoma, grade II. Prognostic indicators significant for: estrogen receptor, 0% negative and progesterone receptor, 0% negative. Proliferation marker Ki67 at 40%. HER2 negative (1+) by immunohistochemistry.   The patient's subsequent history is as detailed below.   PAST MEDICAL HISTORY: Past Medical History:  Diagnosis Date  . Arthritis    "hands sometimes" (07/13/2017)  . Coronary artery disease    a. s/p CABG x3 in 06/2017 with LIMA-LAD, SVG-D1, and SVG-RI.  b. 10/2017: cath showing a widely patent LIMA-LAD with ostial occlusion of the SVG-RI and subtotally occluded atretic SVG-D1. Graft occlusion thought to be 2ry  to improvement in pre-CABG stenoses.   . Essential hypertension   . Family history of adverse reaction to anesthesia    sister had a complication that was stated she had a "foggy" episode after her breast surgery  was readmitted 1 day post op after being discharged from the hospital. Sister also has significant lung problems that could have contributed to this   . Family history of breast cancer   . Family history of colon cancer   . Hypothyroid   . Meniere disease   . Myocardial infarction ([place]) 06/2017   during cardaic rehab  . Obesity   . Osteopenia 01/2012   T score -1.3 FRAX 7.9%/0.6%     PAST SURGICAL HISTORY: Past Surgical History:  Procedure Laterality Date  . BREAST LUMPECTOMY WITH RADIOACTIVE SEED AND SENTINEL LYMPH NODE BIOPSY Left 09/01/2018   Procedure: LEFT BREAST LUMPECTOMY WITH RADIOACTIVE SEED AND SENTINEL LYMPH NODE BIOPSY;  Surgeon: Jovita Kussmaul, MD;  Location: [facility];  Service: General;  Laterality: Left;  . BREAST SURGERY    . CARDIAC CATHETERIZATION  07/13/2017  . CATARACT EXTRACTION W/PHACO Right 01/28/2015   Procedure: CATARACT EXTRACTION PHACO AND INTRAOCULAR LENS PLACEMENT :  CDE:  5.70;  Surgeon: Rutherford Guys, MD;  Location: [facility];  Service: Ophthalmology;  Laterality: Right;  . CATARACT EXTRACTION W/PHACO Left 02/11/2015   Procedure: CATARACT EXTRACTION PHACO AND INTRAOCULAR LENS PLACEMENT (IOC);  Surgeon: Rutherford Guys, MD;  Location: [facility];  Service: Ophthalmology;  Laterality: Left;  CDE: 7.38  . COLONOSCOPY N/A 09/26/2012   Procedure: COLONOSCOPY;  Surgeon: Jamesetta So, MD;  [facility];  Service: Gastroenterology;  Laterality: N/A;  . CORONARY ARTERY BYPASS GRAFT N/A 07/15/2017   Procedure: CORONARY ARTERY BYPASS GRAFTING (CABG) X 3 USING LEFT INTERNAL MAMMARY ARTERY AND RIGHT SAPHENOUS VEIN- ENDOSCOPICALLY HARVESTED;  Surgeon: Melrose Nakayama, MD;  Location: [facility];  Service: Open Heart Surgery;  Laterality: N/A;  . IR IMAGING GUIDED PORT INSERTION  12/29/2018  . LEFT HEART CATH AND CORONARY ANGIOGRAPHY N/A 07/13/2017   Procedure: LEFT HEART CATH AND CORONARY ANGIOGRAPHY;  Surgeon: Martinique, Peter M, MD;  Location: [facility];  Service:  Cardiovascular;  Laterality: N/A;  . LEFT HEART CATH AND CORS/GRAFTS ANGIOGRAPHY N/A 11/03/2017   Procedure: LEFT HEART CATH AND CORS/GRAFTS ANGIOGRAPHY;  Surgeon: Troy Sine, MD;  Location: [facility];  Service: Cardiovascular;  Laterality: N/A;  . TEE WITHOUT CARDIOVERSION N/A 07/15/2017   Procedure: TRANSESOPHAGEAL ECHOCARDIOGRAM (TEE);  Surgeon: Melrose Nakayama, MD;  Location: [facility];  Service: Open Heart Surgery;  Laterality: N/A;  . TUBAL LIGATION    . TYMPANOPLASTY Left    fluid from ear drum     FAMILY HISTORY: Family History  Problem Relation Age of Onset  . Diabetes Sister        AODM  . Heart disease Sister 50       CABG  . Breast cancer Sister   . Heart disease Brother 78       In his 5s  . Colon cancer Maternal Uncle 23  . Diabetes Sister        AODM  . Hypertension Daughter   . Hypertension Daughter   . Breast cancer Cousin        PATERNAL COUSIN  . Heart attack Father        In his 30s  . Stroke Mother   . Stroke Maternal Grandmother   . Diabetes Maternal Grandfather  d. 109  . Heart attack Paternal Grandmother   . Colon cancer Maternal Uncle 76  . Breast cancer Cousin        dx in her 56s; mat first cousin  . Breast cancer Cousin        dx 63s; d. 67s  . Breast cancer Niece 109   Gloria Mccoy's father died from a myocardial infarction at age 28. Patients' mother died from a stroke at age 30. The patient has 3 brothers and 9 sisters. One of Gloria Mccoy's sisters, Gloria Mccoy, was diagnosed with breast cancer at the age of 75. Gloria Mccoy also has a niece that was diagnosed with breast cancer at 24, and two cousins that were diagnosed with breast cancer.  She believes 1 of her brothers may have prostate cancer.  Patient denies anyone in her family having ovarian or pancreatic cancer. Eylin has an uncle that was diagnosed with colon cancer and an uncle that was diagnosed with "stomach" cancer.    GYNECOLOGIC HISTORY:  No LMP recorded. Patient is  postmenopausal. 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 Her husband, Gloria Mccoy, is a retired Administrator. They have two daughters. Their daughter, Gloria Mccoy, lives in [place] and is a housewife.  Their other daughter works at [employer]. Gloria Mccoy had one grandchild that is now deceased. She attends a [employer].    ADVANCED DIRECTIVES: Gloria Mccoy's husband, Gloria Mccoy, is automatically her healthcare power of attorney.     HEALTH MAINTENANCE: Social History   Tobacco Use  . Smoking status: Never Smoker  . Smokeless tobacco: Former Systems developer    Types: Snuff  Substance Use Topics  . Alcohol use: Yes    Alcohol/week: 1.0 standard drinks    Types: 1 Standard drinks or equivalent per week    Comment: occasionally  . Drug use: No    Colonoscopy: yes, Dr. Arnoldo Morale  PAP:   Bone density: yes, 2016, -1.1 osteopenic   Allergies  Allergen Reactions  . Aloe Vera Itching    Per patient severe itching  . Sulfa Antibiotics Shortness Of Breath  . Sulfasalazine Shortness Of Breath    Current Outpatient Medications  Medication Sig Dispense Refill  . acetaminophen (TYLENOL) 325 MG tablet Take 2 tablets (650 mg total) by mouth every 6 (six) hours as needed for mild pain.    Marland Kitchen aspirin EC 81 MG tablet Take 81 mg by mouth daily.    Marland Kitchen atorvastatin (LIPITOR) 80 MG tablet Take 1 tablet (80 mg total) by mouth daily at 6 PM. 90 tablet 3  . Cholecalciferol (VITAMIN D PO) Take 1 tablet by mouth daily.     . clopidogrel (PLAVIX) 75 MG tablet Take 1 tablet by mouth once daily 90 tablet 3  . Cyanocobalamin (VITAMIN B-12 PO) Take 1 tablet by mouth daily.    Marland Kitchen dexamethasone (DECADRON) 4 MG tablet Take 1 tablet (4 mg total) by mouth daily. Start the day after chemotherapy for 2 days. Take with food. 30 tablet 1  . fish oil-omega-3 fatty acids  1000 MG capsule Take 1 g by mouth daily.    . furosemide (LASIX) 40 MG tablet Take 1 tablet (40 mg total) by mouth daily. 30 tablet 1  . HYDROcodone-acetaminophen (NORCO/VICODIN) 5-325 MG tablet Take 1-2 tablets by mouth every 6 (six) hours as needed for moderate  pain or severe pain. 60 tablet 0  . levothyroxine (SYNTHROID) 25 MCG tablet     . levothyroxine (SYNTHROID, LEVOTHROID) 75 MCG tablet Take 75 mcg by mouth daily.    Marland Kitchen lidocaine-prilocaine (EMLA) cream Apply 1 application topically as needed. 30 g 0  . meloxicam (MOBIC) 15 MG tablet Take 15 mg by mouth daily.    . metoprolol tartrate (LOPRESSOR) 25 MG tablet Take 1 tablet (25 mg total) by mouth 2 (two) times daily. 180 tablet 3  . prochlorperazine (COMPAZINE) 10 MG tablet Take 1 tablet (10 mg total) by mouth 3 (three) times daily before meals. 30 tablet 1  . VITAMIN E PO Take 1 tablet by mouth daily.     Marland Kitchen losartan (COZAAR) 25 MG tablet Take 0.5 tablets (12.5 mg total) by mouth daily. 45 tablet 3   No current facility-administered medications for this visit.      OBJECTIVE:  Vitals:   01/24/19 0825  BP: (!) 117/56  Pulse: 60  Resp: 18  SpO2: 94%     Body mass index is 38.32 kg/m.   Wt Readings from Last 3 Encounters:  01/24/19 209 lb 8 oz (95 kg)  01/16/19 209 lb 8 oz (95 kg)  12/26/18 207 lb (93.9 kg)  ECOG FS:1 - Symptomatic but completely ambulatory GENERAL: Patient is a well appearing female in no acute distress HEENT:  Sclerae anicteric.  Oropharynx clear and moist. No ulcerations or evidence of oropharyngeal candidiasis. Neck is supple.  NODES:  No cervical, supraclavicular, or axillary lymphadenopathy palpated.  BREAST EXAM:  Left breast with hyperpigmentation, healing desquamation noted on left upper outer breast and underneath her breast LUNGS:  Clear to auscultation bilaterally.  No wheezes or rhonchi. HEART:  Regular rate and rhythm. No murmur appreciated. ABDOMEN:  Soft, nontender.  Positive, normoactive bowel  sounds. No organomegaly palpated. MSK:  No focal spinal tenderness to palpation. Full range of motion bilaterally in the upper extremities. EXTREMITIES:  1+ bilateral lower extremity edema SKIN:  Clear with no obvious rashes or skin changes. No nail dyscrasia. NEURO:  Nonfocal. Well oriented.  Appropriate affect.     LAB RESULTS:  CMP     Component Value Date/Time   NA 141 01/16/2019 0840   K 3.9 01/16/2019 0840   CL 106 01/16/2019 0840   CO2 26 01/16/2019 0840   GLUCOSE 105 (H) 01/16/2019 0840   BUN 10 01/16/2019 0840   CREATININE 0.78 01/16/2019 0840   CALCIUM 9.0 01/16/2019 0840   PROT 6.7 01/16/2019 0840   ALBUMIN 3.2 (L) 01/16/2019 0840   AST 22 01/16/2019 0840   ALT 10 01/16/2019 0840   ALKPHOS 70 01/16/2019 0840   BILITOT 0.7 01/16/2019 0840   GFRNONAA >60 01/16/2019 0840   GFRAA >60 01/16/2019 0840    No results found for: TOTALPROTELP, ALBUMINELP, A1GS, A2GS, BETS, BETA2SER, GAMS, MSPIKE, SPEI  No results found for: KPAFRELGTCHN, LAMBDASER, KAPLAMBRATIO  Lab Results  Component Value Date   WBC 7.5 01/24/2019   NEUTROABS 3.6 01/24/2019   HGB 11.7 (L) 01/24/2019   HCT 33.3 (L) 01/24/2019   MCV 85.4 01/24/2019   PLT 187 01/24/2019    '@LASTCHEMISTRY' @  No results found for: LABCA2  No components found for: [id number]  No results for input(s): INR in the last 168 hours.  No results found for: LABCA2  No results found for: WUJ811  No results found for: BJY782  No results found for: NFA213  No results found for: CA2729  No components found for:  HGQUANT  No results found for: CEA1 / No results found for: CEA1   No results found for: AFPTUMOR  No results found for: [place]  No results found for: PSA1  Appointment on 01/24/2019  Component Date Value Ref Range Status  . WBC 01/24/2019 7.5  4.0 - 10.5 K/uL Final  . RBC 01/24/2019 3.90  3.87 - 5.11 MIL/uL Final  . Hemoglobin 01/24/2019 11.7* 12.0 - 15.0 g/dL Final  . HCT 01/24/2019 33.3*  36.0 - 46.0 % Final  . MCV 01/24/2019 85.4  80.0 - 100.0 fL Final  . MCH 01/24/2019 30.0  26.0 - 34.0 pg Final  . MCHC 01/24/2019 35.1  30.0 - 36.0 g/dL Final  . RDW 01/24/2019 14.8  11.5 - 15.5 % Final  . Platelets 01/24/2019 187  150 - 400 K/uL Final  . nRBC 01/24/2019 0.0  0.0 - 0.2 % Final  . Neutrophils Relative % 01/24/2019 48  % Final  . Neutro Abs 01/24/2019 3.6  1.7 - 7.7 K/uL Final  . Lymphocytes Relative 01/24/2019 22  % Final  . Lymphs Abs 01/24/2019 1.6  0.7 - 4.0 K/uL Final  . Monocytes Relative 01/24/2019 25  % Final  . Monocytes Absolute 01/24/2019 1.8* 0.1 - 1.0 K/uL Final  . Eosinophils Relative 01/24/2019 4  % Final  . Eosinophils Absolute 01/24/2019 0.3  0.0 - 0.5 K/uL Final  . Basophils Relative 01/24/2019 1  % Final  . Basophils Absolute 01/24/2019 0.1  0.0 - 0.1 K/uL Final  . Immature Granulocytes 01/24/2019 0  % Final  . Abs Immature Granulocytes 01/24/2019 0.03  0.00 - 0.07 K/uL Final   Performed at [facility], [address] [place], [place] [address]    (this displays the last labs from the last 3 days)  No results found for: TOTALPROTELP, ALBUMINELP, A1GS, A2GS, BETS, BETA2SER, GAMS, MSPIKE, SPEI (this displays SPEP labs)  No results found for: KPAFRELGTCHN, LAMBDASER, KAPLAMBRATIO (kappa/lambda light chains)  No results found for: HGBA, HGBA2QUANT, HGBFQUANT, HGBSQUAN (Hemoglobinopathy evaluation)   No results found for: LDH  No results found for: IRON, TIBC, IRONPCTSAT (Iron and TIBC)  No results found for: FERRITIN  Urinalysis    Component Value Date/Time   COLORURINE YELLOW 07/14/2017 2015   APPEARANCEUR HAZY (A) 07/14/2017 2015   LABSPEC 1.011 07/14/2017 2015   PHURINE 6.0 07/14/2017 2015   GLUCOSEU NEGATIVE 07/14/2017 2015   [place] 07/14/2017 2015   [place] 07/14/2017 2015   KETONESUR NEGATIVE 07/14/2017 2015   PROTEINUR NEGATIVE 07/14/2017 2015   UROBILINOGEN 0.2 07/03/2014 0852    NITRITE NEGATIVE 07/14/2017 2015   LEUKOCYTESUR LARGE (A) 07/14/2017 2015     STUDIES:  Ir Imaging Guided Port Insertion  Result Date: 12/29/2018 INDICATION: 73 year old female with a history of left-sided breast cancer now with multifocal metastatic disease. She presents for port catheter placement for chemotherapy. EXAM: IMPLANTED PORT A CATH PLACEMENT WITH ULTRASOUND AND FLUOROSCOPIC GUIDANCE MEDICATIONS: 2 g Ancef; The antibiotic was administered within an appropriate time interval prior to skin puncture. ANESTHESIA/SEDATION: Versed 2 mg IV; Fentanyl 100 mcg IV; Moderate Sedation Time:  20 minutes The patient was continuously monitored during the procedure by the interventional radiology nurse under my direct supervision. FLUOROSCOPY TIME:  0 minutes, 48 seconds (13 mGy) COMPLICATIONS: None immediate. PROCEDURE: The right neck and chest was prepped with chlorhexidine, and draped in the usual sterile fashion using maximum barrier technique (cap and mask, sterile gown, sterile gloves, large sterile sheet, hand hygiene and cutaneous  antiseptic). Local anesthesia was attained by infiltration with 1% lidocaine with epinephrine. Ultrasound demonstrated patency of the right internal jugular vein, and this was documented with an image. Under real-time ultrasound guidance, this vein was accessed with a 21 gauge micropuncture needle and image documentation was performed. A small dermatotomy was made at the access site with an 11 scalpel. A 0.018" wire was advanced into the SVC and the access needle exchanged for a 50F micropuncture vascular sheath. The 0.018" wire was then removed and a 0.035" wire advanced into the IVC. An appropriate location for the subcutaneous reservoir was selected below the clavicle and an incision was made through the skin and underlying soft tissues. The subcutaneous tissues were then dissected using a combination of blunt and sharp surgical technique and a pocket was formed. A single  lumen power injectable portacatheter was then tunneled through the subcutaneous tissues from the pocket to the dermatotomy and the port reservoir placed within the subcutaneous pocket. The venous access site was then serially dilated and a peel away vascular sheath placed over the wire. The wire was removed and the port catheter advanced into position under fluoroscopic guidance. The catheter tip is positioned in the superior cavoatrial junction. This was documented with a spot image. The portacatheter was then tested and found to flush and aspirate well. The port was flushed with saline followed by 100 units/mL heparinized saline. The pocket was then closed in two layers using first subdermal inverted interrupted absorbable sutures followed by a running subcuticular suture. The epidermis was then sealed with Dermabond. The dermatotomy at the venous access site was also closed with Dermabond. IMPRESSION: Successful placement of a right IJ approach Power Port with ultrasound and fluoroscopic guidance. The catheter is ready for use. Electronically Signed   By: Jacqulynn Cadet M.D.   On: 12/29/2018 15:22     ELIGIBLE FOR AVAILABLE RESEARCH PROTOCOL: no   ASSESSMENT: 73 y.o. [place], [place] woman status post left breast lower inner quadrant biopsy 08/01/2018 for a clinical T1c N0, stage IB invasive ductal carcinoma, grade 2, triple negative, with an MIB-1 of 40%  (1) genetics testing 08/29/2018 through with Hereditary Gene Panel offered by [facility] found no deleterious mutations in APC, ATM, AXIN2, BARD1, BMPR1A, BRCA1, BRCA2, BRIP1, CDH1, CDK4, CDKN2A (p14ARF), CDKN2A (p16INK4a), CHEK2, CTNNA1, DICER1, EPCAM (Deletion/duplication testing only), GREM1 (promoter region deletion/duplication testing only), KIT, MEN1, MLH1, MSH2, MSH3, MSH6, MUTYH, NBN, NF1, NHTL1, PALB2, PDGFRA, PMS2, POLD1, POLE, PTEN, RAD50, RAD51C, RAD51D, SDHB, SDHC, SDHD, SMAD4, SMARCA4. STK11, TP53, TSC1, TSC2, and VHL.  The following genes  were evaluated for sequence changes only: SDHA and HOXB13 c.251G>A variant only.   (a) MLH1 c.1890T>G VUS identified  (2) status post left lumpectomy with sentinel lymph node sampling 09/01/2022 a pT1c pN0, stage IB invasive ductal carcinoma, grade 2, with negative margins.  (a) a total of 3 sentinel lymph nodes were removed  (3) chemotherapy consisting of cyclophosphamide, methotrexate, and fluorouracil (CMF), repeated every 21 days x 8, starting 10/24/2018  (4) adjuvant radiation given concurrently with cycles 3-4-5 of chemotherapy   PLAN: Lakresha is feeling better today and healing from the radiation.  This is good to hear.  She is going to proceed with cycle 5 of CMF chemotherapy (without Methotrexate due to recent radiation therapy).  Her labs were stable thus far (CMET pending at time of appointment completion).  Catilyn does have continued lower extremity swelling.  This is mild.  I recommended that as she regains mobility and strength that she  elevate her legs after taking short walks.  We also discussed sodium limitation in her diet and wearing compression hose.    Savanah's breast is healing nicely.  She is requiring less pain medication for pain, as her pain is decreasing.  She was recommended to continue to use the cream on her breast supplied by radiation and to make sure she is taking stool softeners/laxatives as needed while on Norco to prevent opiod induced constipation.  PMP aware reviewed today.    Gloria Mccoy will return in 3 weeks for labs, f/u, and her next CMF.  She was recommended to continue with the appropriate pandemic precautions. She knows to call for any questions that may arise between now and her next appointment.  We are happy to see her sooner if needed.  A total of (20) minutes of face-to-face time was spent with this patient with greater than 50% of that time in counseling and care-coordination.   Wilber Bihari, NP  01/24/19 8:44 AM Medical Oncology and  Hematology [facility] [address] [place], [place] [address] Tel. [phone]    Fax. [phone]

## 2019-02-06 ENCOUNTER — Ambulatory Visit: Payer: Medicare HMO | Admitting: Adult Health

## 2019-02-06 ENCOUNTER — Other Ambulatory Visit: Payer: Medicare HMO

## 2019-02-06 ENCOUNTER — Ambulatory Visit: Payer: Medicare HMO

## 2019-02-12 ENCOUNTER — Telehealth: Payer: Self-pay | Admitting: Radiation Oncology

## 2019-02-12 NOTE — Telephone Encounter (Signed)
  Radiation Oncology         [phone]) [phone] ________________________________  Name: Gloria Mccoy MRN: [id number]  Date of Service: 02/12/2019  DOB: [birth year]  Post Treatment Telephone Note  Diagnosis:   Stage IB, pT1cN0M0 grade 2 triple negative invasive ductal carcinoma of the left breast.   Interval Since Last Radiation:  4 weeks   12/05/2018-01/19/2019: The left breast was treated to 50.4 Gy in 28 fractions, followed by a 10 Gy boost to the lumpectomy cavity. She was treated with this fractionation schedule due to ongoing chemotherapy.  Narrative:  The patient was contacted today for routine follow-up. During treatment she did very well with radiotherapy and unfortunately did have significant desquamation requiring hydrogel pads and neosporin.   Impression/Plan: 1. Stage IB, pT1cN0M0 grade 2 triple negative invasive ductal carcinoma of the left breast.  I left her a voicemail asking her to call me back to discuss her skin care. She will also continue to follow up with Dr. Jana Hakim in medical oncology as currently scheduled.     Carola Rhine, PAC

## 2019-02-13 ENCOUNTER — Encounter: Payer: Self-pay | Admitting: *Deleted

## 2019-02-15 ENCOUNTER — Encounter: Payer: Self-pay | Admitting: Adult Health

## 2019-02-15 ENCOUNTER — Inpatient Hospital Stay: Payer: Medicare HMO

## 2019-02-15 ENCOUNTER — Inpatient Hospital Stay (HOSPITAL_BASED_OUTPATIENT_CLINIC_OR_DEPARTMENT_OTHER): Payer: Medicare HMO | Admitting: Adult Health

## 2019-02-15 ENCOUNTER — Other Ambulatory Visit: Payer: Self-pay

## 2019-02-15 VITALS — BP 138/52 | HR 75 | Temp 99.1°F | Resp 18 | Ht 62.0 in | Wt 205.0 lb

## 2019-02-15 DIAGNOSIS — Z171 Estrogen receptor negative status [ER-]: Secondary | ICD-10-CM

## 2019-02-15 DIAGNOSIS — C50512 Malignant neoplasm of lower-outer quadrant of left female breast: Secondary | ICD-10-CM

## 2019-02-15 DIAGNOSIS — R5383 Other fatigue: Secondary | ICD-10-CM | POA: Diagnosis not present

## 2019-02-15 DIAGNOSIS — R600 Localized edema: Secondary | ICD-10-CM | POA: Diagnosis not present

## 2019-02-15 DIAGNOSIS — C50312 Malignant neoplasm of lower-inner quadrant of left female breast: Secondary | ICD-10-CM | POA: Diagnosis not present

## 2019-02-15 DIAGNOSIS — I2510 Atherosclerotic heart disease of native coronary artery without angina pectoris: Secondary | ICD-10-CM | POA: Diagnosis not present

## 2019-02-15 DIAGNOSIS — Z95828 Presence of other vascular implants and grafts: Secondary | ICD-10-CM

## 2019-02-15 DIAGNOSIS — N644 Mastodynia: Secondary | ICD-10-CM | POA: Diagnosis not present

## 2019-02-15 DIAGNOSIS — M1990 Unspecified osteoarthritis, unspecified site: Secondary | ICD-10-CM | POA: Diagnosis not present

## 2019-02-15 DIAGNOSIS — M7989 Other specified soft tissue disorders: Secondary | ICD-10-CM | POA: Diagnosis not present

## 2019-02-15 DIAGNOSIS — I10 Essential (primary) hypertension: Secondary | ICD-10-CM | POA: Diagnosis not present

## 2019-02-15 LAB — COMPREHENSIVE METABOLIC PANEL
ALT: 12 U/L (ref 0–44)
AST: 18 U/L (ref 15–41)
Albumin: 3.5 g/dL (ref 3.5–5.0)
Alkaline Phosphatase: 74 U/L (ref 38–126)
Anion gap: 9 (ref 5–15)
BUN: 9 mg/dL (ref 8–23)
CO2: 26 mmol/L (ref 22–32)
Calcium: 8.5 mg/dL — ABNORMAL LOW (ref 8.9–10.3)
Chloride: 107 mmol/L (ref 98–111)
Creatinine, Ser: 0.83 mg/dL (ref 0.44–1.00)
GFR calc Af Amer: >60 mL/min (ref >60)
GFR calc non Af Amer: >60 mL/min (ref >60)
Glucose, Bld: 124 mg/dL — ABNORMAL HIGH (ref 70–99)
Potassium: 3.1 mmol/L — ABNORMAL LOW (ref 3.5–5.1)
Sodium: 142 mmol/L (ref 135–145)
Total Bilirubin: 1.1 mg/dL (ref 0.3–1.2)
Total Protein: 6.9 g/dL (ref 6.5–8.1)

## 2019-02-15 LAB — CBC WITH DIFFERENTIAL/PLATELET
Abs Immature Granulocytes: 0.03 10*3/uL (ref 0.00–0.07)
Basophils Absolute: 0 10*3/uL (ref 0.0–0.1)
Basophils Relative: 0 %
Eosinophils Absolute: 0.3 10*3/uL (ref 0.0–0.5)
Eosinophils Relative: 4 %
HCT: 34.9 % — ABNORMAL LOW (ref 36.0–46.0)
Hemoglobin: 12.5 g/dL (ref 12.0–15.0)
Immature Granulocytes: 0 %
Lymphocytes Relative: 22 %
Lymphs Abs: 2 10*3/uL (ref 0.7–4.0)
MCH: 29.4 pg (ref 26.0–34.0)
MCHC: 35.8 g/dL (ref 30.0–36.0)
MCV: 82.1 fL (ref 80.0–100.0)
Monocytes Absolute: 1.2 10*3/uL — ABNORMAL HIGH (ref 0.1–1.0)
Monocytes Relative: 13 %
Neutro Abs: 5.5 10*3/uL (ref 1.7–7.7)
Neutrophils Relative %: 61 %
Platelets: 174 10*3/uL (ref 150–400)
RBC: 4.25 MIL/uL (ref 3.87–5.11)
RDW: 15.1 % (ref 11.5–15.5)
WBC: 9 10*3/uL (ref 4.0–10.5)
nRBC: 0 % (ref 0.0–0.2)

## 2019-02-15 MED ORDER — SODIUM CHLORIDE 0.9% FLUSH
10.0000 mL | Freq: Once | INTRAVENOUS | Status: AC
  Administered 2019-02-15: 10 mL
  Filled 2019-02-15: qty 10

## 2019-02-15 NOTE — Patient Instructions (Signed)

## 2019-02-15 NOTE — Progress Notes (Signed)
[place]  [phone] [phone]    ID: Gloria Mccoy DOB: 09/11/1945  MR#: [id number]  VEL#:[id number]  Patient Care Team: Redmond School, MD as PCP - General (Internal Medicine) Harl Bowie, Alphonse Guild, MD as PCP - Cardiology (Cardiology) Magrinat, Virgie Dad, MD as Consulting Physician (Oncology) Kyung Rudd, MD as Consulting Physician (Radiation Oncology) Rutherford Guys, MD as Consulting Physician (Ophthalmology) Jovita Kussmaul, MD as Consulting Physician (General Surgery) Mauro Kaufmann, RN as Oncology Nurse Navigator Rockwell Germany, RN as Oncology Nurse Navigator OTHER MD:    CHIEF COMPLAINT: Triple negative breast cancer  CURRENT TREATMENT: Adjuvant chemotherapy, adjuvant radiation   INTERVAL HISTORY: Gloria Mccoy was seen today for follow-up and treatment of her triple negative breast cancer.   Gloria Mccoy completed radiation on 7/31 and is healing well from this.  She continues on adjuvant chemotherapy consisting of cyclophosphamide, methotrexate, and fluorouracil (CMF), repeated every 21 days x 8. She is currently cycle 6 day 1 of her therapy.    REVIEW OF SYSTEMS: Gloria Mccoy is doing well.  She notes that her lower extremity swelling is improved.  She is active.  She did receive her flu and shingles vaccine yesterday.  She says after she received it, she developed a rash on her arms that resolved after taking 2 benadryl.  It hasn't returned.  Gloria Mccoy denies any fever or chills.  She is without nausea, vomiting, bowel/bladder changes.  She has no cough, shortness of breath, chest pain, or palpitations.  Her breast pain is much improved, and when it bothers her she takes tylenol if needed.  A detailed ROS was otherwise non contributory.     HISTORY OF CURRENT ILLNESS: From the original intake note:  Gloria Mccoy had routine screening mammography on 07/12/2018 showing a possible abnormality in the left breast. She underwent unilateral left  diagnostic mammography with tomography and left breast ultrasonography at [facility] on 07/25/2018 showing: Breast Density Category B. There is a mass with indistinct margins in the lower inner posterior left breast measuring approximately 0.6 cm. Targeted ultrasound of the lower left breast was performed. There is a hypoechoic mass with margin irregularity in the left breast at 7 o'clock, 7 cm from the nipple measuring 0.7 x 0.5 x 0.6 cm. No lymphadenopathy seen in the left axilla.   Accordingly on 08/01/2018 she proceeded to biopsy of the left breast area in question. The pathology from this procedure showed ([id number]): invasive ductal carcinoma, grade II. Prognostic indicators significant for: estrogen receptor, 0% negative and progesterone receptor, 0% negative. Proliferation marker Ki67 at 40%. HER2 negative (1+) by immunohistochemistry.   The patient's subsequent history is as detailed below.   PAST MEDICAL HISTORY: Past Medical History:  Diagnosis Date  . Arthritis    "hands sometimes" (07/13/2017)  . Coronary artery disease    a. s/p CABG x3 in 06/2017 with LIMA-LAD, SVG-D1, and SVG-RI.  b. 10/2017: cath showing a widely patent LIMA-LAD with ostial occlusion of the SVG-RI and subtotally occluded atretic SVG-D1. Graft occlusion thought to be 2ry to improvement in pre-CABG stenoses.   . Essential hypertension   . Family history of adverse reaction to anesthesia    sister had a complication that was stated she had a "foggy" episode after her breast surgery was readmitted 1 day post op after being discharged from the hospital. Sister also has significant lung problems that could have contributed to this   . Family history of breast cancer   . Family history of  colon cancer   . Hypothyroid   . Meniere disease   . Myocardial infarction ([place]) 06/2017   during cardaic rehab  . Obesity   . Osteopenia 01/2012   T score -1.3 FRAX 7.9%/0.6%     PAST SURGICAL HISTORY: Past Surgical  History:  Procedure Laterality Date  . BREAST LUMPECTOMY WITH RADIOACTIVE SEED AND SENTINEL LYMPH NODE BIOPSY Left 09/01/2018   Procedure: LEFT BREAST LUMPECTOMY WITH RADIOACTIVE SEED AND SENTINEL LYMPH NODE BIOPSY;  Surgeon: Jovita Kussmaul, MD;  Location: [facility];  Service: General;  Laterality: Left;  . BREAST SURGERY    . CARDIAC CATHETERIZATION  07/13/2017  . CATARACT EXTRACTION W/PHACO Right 01/28/2015   Procedure: CATARACT EXTRACTION PHACO AND INTRAOCULAR LENS PLACEMENT :  CDE:  5.70;  Surgeon: Rutherford Guys, MD;  Location: [facility];  Service: Ophthalmology;  Laterality: Right;  . CATARACT EXTRACTION W/PHACO Left 02/11/2015   Procedure: CATARACT EXTRACTION PHACO AND INTRAOCULAR LENS PLACEMENT (IOC);  Surgeon: Rutherford Guys, MD;  Location: [facility];  Service: Ophthalmology;  Laterality: Left;  CDE: 7.38  . COLONOSCOPY N/A 09/26/2012   Procedure: COLONOSCOPY;  Surgeon: Jamesetta So, MD;  [facility];  Service: Gastroenterology;  Laterality: N/A;  . CORONARY ARTERY BYPASS GRAFT N/A 07/15/2017   Procedure: CORONARY ARTERY BYPASS GRAFTING (CABG) X 3 USING LEFT INTERNAL MAMMARY ARTERY AND RIGHT SAPHENOUS VEIN- ENDOSCOPICALLY HARVESTED;  Surgeon: Melrose Nakayama, MD;  Location: [facility];  Service: Open Heart Surgery;  Laterality: N/A;  . IR IMAGING GUIDED PORT INSERTION  12/29/2018  . LEFT HEART CATH AND CORONARY ANGIOGRAPHY N/A 07/13/2017   Procedure: LEFT HEART CATH AND CORONARY ANGIOGRAPHY;  Surgeon: Martinique, Peter M, MD;  Location: [facility];  Service: Cardiovascular;  Laterality: N/A;  . LEFT HEART CATH AND CORS/GRAFTS ANGIOGRAPHY N/A 11/03/2017   Procedure: LEFT HEART CATH AND CORS/GRAFTS ANGIOGRAPHY;  Surgeon: Troy Sine, MD;  Location: [facility];  Service: Cardiovascular;  Laterality: N/A;  . TEE WITHOUT CARDIOVERSION N/A 07/15/2017   Procedure: TRANSESOPHAGEAL ECHOCARDIOGRAM (TEE);  Surgeon: Melrose Nakayama, MD;  Location: [facility];  Service: Open Heart Surgery;   Laterality: N/A;  . TUBAL LIGATION    . TYMPANOPLASTY Left    fluid from ear drum     FAMILY HISTORY: Family History  Problem Relation Age of Onset  . Diabetes Sister        AODM  . Heart disease Sister 48       CABG  . Breast cancer Sister   . Heart disease Brother 37       In his 71s  . Colon cancer Maternal Uncle 68  . Diabetes Sister        AODM  . Hypertension Daughter   . Hypertension Daughter   . Breast cancer Cousin        PATERNAL COUSIN  . Heart attack Father        In his 15s  . Stroke Mother   . Stroke Maternal Grandmother   . Diabetes Maternal Grandfather        d. 109  . Heart attack Paternal Grandmother   . Colon cancer Maternal Uncle 66  . Breast cancer Cousin        dx in her 88s; mat first cousin  . Breast cancer Cousin        dx 47s; d. 23s  . Breast cancer Niece 13   Nakoma's father died from a myocardial infarction at age 31. Patients' mother died  from a stroke at age 32. The patient has 3 brothers and 9 sisters. One of Launi's sisters, [name], was diagnosed with breast cancer at the age of 6. Abeeha also has a niece that was diagnosed with breast cancer at 53, and two cousins that were diagnosed with breast cancer.  She believes 1 of her brothers may have prostate cancer.  Patient denies anyone in her family having ovarian or pancreatic cancer. Kiearra has an uncle that was diagnosed with colon cancer and an uncle that was diagnosed with "stomach" cancer.    GYNECOLOGIC HISTORY:  No LMP recorded. Patient is postmenopausal. 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 Her husband, Jason Fila, is a retired Administrator. They have two daughters. Their daughter, [name], lives in [place] and is a housewife.  Their other daughter works at [employer]. Fabiha had one  grandchild that is now deceased. She attends a [employer].    ADVANCED DIRECTIVES: Eviana's husband, Jason Fila, is automatically her healthcare power of attorney.     HEALTH MAINTENANCE: Social History   Tobacco Use  . Smoking status: Never Smoker  . Smokeless tobacco: Former Systems developer    Types: Snuff  Substance Use Topics  . Alcohol use: Yes    Alcohol/week: 1.0 standard drinks    Types: 1 Standard drinks or equivalent per week    Comment: occasionally  . Drug use: No    Colonoscopy: yes, Dr. Arnoldo Morale  PAP:   Bone density: yes, 2016, -1.1 osteopenic   Allergies  Allergen Reactions  . Aloe Vera Itching    Per patient severe itching  . Sulfa Antibiotics Shortness Of Breath  . Sulfasalazine Shortness Of Breath    Current Outpatient Medications  Medication Sig Dispense Refill  . acetaminophen (TYLENOL) 325 MG tablet Take 2 tablets (650 mg total) by mouth every 6 (six) hours as needed for mild pain.    Marland Kitchen aspirin EC 81 MG tablet Take 81 mg by mouth daily.    Marland Kitchen atorvastatin (LIPITOR) 80 MG tablet Take 1 tablet (80 mg total) by mouth daily at 6 PM. 90 tablet 3  . Cholecalciferol (VITAMIN D PO) Take 1 tablet by mouth daily.     . clopidogrel (PLAVIX) 75 MG tablet Take 1 tablet by mouth once daily 90 tablet 3  . Cyanocobalamin (VITAMIN B-12 PO) Take 1 tablet by mouth daily.    Marland Kitchen dexamethasone (DECADRON) 4 MG tablet Take 1 tablet (4 mg total) by mouth daily. Start the day after chemotherapy for 2 days. Take with food. 30 tablet 1  . fish oil-omega-3 fatty acids 1000 MG capsule Take 1 g by mouth daily.    . furosemide (LASIX) 40 MG tablet Take 1 tablet (40 mg total) by mouth daily. 30 tablet 1  . HYDROcodone-acetaminophen (NORCO/VICODIN) 5-325 MG tablet Take 1-2 tablets by mouth every 6 (six) hours as needed for moderate pain or severe pain. 60 tablet 0  . levothyroxine (SYNTHROID) 25 MCG tablet     . levothyroxine (SYNTHROID, LEVOTHROID) 75 MCG tablet Take 75 mcg by mouth daily.    Marland Kitchen  lidocaine-prilocaine (EMLA) cream Apply 1 application topically as needed. 30 g 0  . losartan (COZAAR) 25 MG tablet Take 0.5 tablets (12.5 mg total) by mouth daily. 45 tablet 3  . meloxicam (MOBIC) 15 MG  tablet Take 15 mg by mouth daily.    . metoprolol tartrate (LOPRESSOR) 25 MG tablet Take 1 tablet (25 mg total) by mouth 2 (two) times daily. 180 tablet 3  . prochlorperazine (COMPAZINE) 10 MG tablet Take 1 tablet (10 mg total) by mouth 3 (three) times daily before meals. 30 tablet 1  . VITAMIN E PO Take 1 tablet by mouth daily.      No current facility-administered medications for this visit.      OBJECTIVE:  Vitals:   02/15/19 1120  BP: (!) 138/52  Pulse: 75  Resp: 18  Temp: 99.1 F (37.3 C)  SpO2: 94%     Body mass index is 37.49 kg/m.   Wt Readings from Last 3 Encounters:  02/15/19 205 lb (93 kg)  01/24/19 209 lb 8 oz (95 kg)  01/16/19 209 lb 8 oz (95 kg)  ECOG FS:1 - Symptomatic but completely ambulatory GENERAL: Patient is a well appearing female in no acute distress HEENT:  Sclerae anicteric.  Oropharynx clear and moist. No ulcerations or evidence of oropharyngeal candidiasis. Neck is supple.  NODES:  No cervical, supraclavicular, or axillary lymphadenopathy palpated.  BREAST EXAM:  deferred LUNGS:  Clear to auscultation bilaterally.  No wheezes or rhonchi. HEART:  Regular rate and rhythm. No murmur appreciated. ABDOMEN:  Soft, nontender.  Positive, normoactive bowel sounds. No organomegaly palpated. MSK:  No focal spinal tenderness to palpation. Full range of motion bilaterally in the upper extremities. EXTREMITIES:  1+ bilateral lower extremity edema SKIN:  Clear with no obvious rashes or skin changes. No nail dyscrasia. NEURO:  Nonfocal. Well oriented.  Appropriate affect.     LAB RESULTS:  CMP     Component Value Date/Time   NA 141 01/24/2019 0811   K 4.0 01/24/2019 0811   CL 106 01/24/2019 0811   CO2 25 01/24/2019 0811   GLUCOSE 111 (H) 01/24/2019 0811    BUN 12 01/24/2019 0811   CREATININE 0.81 01/24/2019 0811   CREATININE 0.78 01/16/2019 0840   CALCIUM 8.9 01/24/2019 0811   PROT 6.9 01/24/2019 0811   ALBUMIN 3.3 (L) 01/24/2019 0811   AST 20 01/24/2019 0811   AST 22 01/16/2019 0840   ALT 13 01/24/2019 0811   ALT 10 01/16/2019 0840   ALKPHOS 68 01/24/2019 0811   BILITOT 0.6 01/24/2019 0811   BILITOT 0.7 01/16/2019 0840   GFRNONAA >60 01/24/2019 0811   GFRNONAA >60 01/16/2019 0840   GFRAA >60 01/24/2019 0811   GFRAA >60 01/16/2019 0840    No results found for: TOTALPROTELP, ALBUMINELP, A1GS, A2GS, BETS, BETA2SER, GAMS, MSPIKE, SPEI  No results found for: KPAFRELGTCHN, LAMBDASER, [facility]  Lab Results  Component Value Date   WBC 9.0 02/15/2019   NEUTROABS 5.5 02/15/2019   HGB 12.5 02/15/2019   HCT 34.9 (L) 02/15/2019   MCV 82.1 02/15/2019   PLT 174 02/15/2019    '@LASTCHEMISTRY' @  No results found for: LABCA2  No components found for: KCMKLK917  No results for input(s): INR in the last 168 hours.  No results found for: LABCA2  No results found for: HXT056  No results found for: PVX480  No results found for: XKP537  No results found for: CA2729  No components found for: HGQUANT  No results found for: CEA1 / No results found for: CEA1   No results found for: AFPTUMOR  No results found for: CHROMOGRNA  No results found for: PSA1  Appointment on 02/15/2019  Component Date Value Ref Range Status  . WBC  02/15/2019 9.0  4.0 - 10.5 K/uL Final  . RBC 02/15/2019 4.25  3.87 - 5.11 MIL/uL Final  . Hemoglobin 02/15/2019 12.5  12.0 - 15.0 g/dL Final  . HCT 02/15/2019 34.9* 36.0 - 46.0 % Final  . MCV 02/15/2019 82.1  80.0 - 100.0 fL Final  . MCH 02/15/2019 29.4  26.0 - 34.0 pg Final  . MCHC 02/15/2019 35.8  30.0 - 36.0 g/dL Final  . RDW 02/15/2019 15.1  11.5 - 15.5 % Final  . Platelets 02/15/2019 174  150 - 400 K/uL Final  . nRBC 02/15/2019 0.0  0.0 - 0.2 % Final  . Neutrophils Relative % 02/15/2019 61  %  Final  . Neutro Abs 02/15/2019 5.5  1.7 - 7.7 K/uL Final  . Lymphocytes Relative 02/15/2019 22  % Final  . Lymphs Abs 02/15/2019 2.0  0.7 - 4.0 K/uL Final  . Monocytes Relative 02/15/2019 13  % Final  . Monocytes Absolute 02/15/2019 1.2* 0.1 - 1.0 K/uL Final  . Eosinophils Relative 02/15/2019 4  % Final  . Eosinophils Absolute 02/15/2019 0.3  0.0 - 0.5 K/uL Final  . Basophils Relative 02/15/2019 0  % Final  . Basophils Absolute 02/15/2019 0.0  0.0 - 0.1 K/uL Final  . Immature Granulocytes 02/15/2019 0  % Final  . Abs Immature Granulocytes 02/15/2019 0.03  0.00 - 0.07 K/uL Final   Performed at [facility], [address] [place], [place] [address]    (this displays the last labs from the last 3 days)  No results found for: TOTALPROTELP, ALBUMINELP, A1GS, A2GS, BETS, BETA2SER, GAMS, MSPIKE, SPEI (this displays SPEP labs)  No results found for: KPAFRELGTCHN, LAMBDASER, KAPLAMBRATIO (kappa/lambda light chains)  No results found for: HGBA, HGBA2QUANT, HGBFQUANT, HGBSQUAN (Hemoglobinopathy evaluation)   No results found for: LDH  No results found for: IRON, TIBC, IRONPCTSAT (Iron and TIBC)  No results found for: FERRITIN  Urinalysis    Component Value Date/Time   COLORURINE YELLOW 07/14/2017 2015   APPEARANCEUR HAZY (A) 07/14/2017 2015   LABSPEC 1.011 07/14/2017 2015   PHURINE 6.0 07/14/2017 2015   [place] 07/14/2017 2015   [place] 07/14/2017 2015   [place] 07/14/2017 2015   [place] 07/14/2017 2015   PROTEINUR NEGATIVE 07/14/2017 2015   UROBILINOGEN 0.2 07/03/2014 0852   NITRITE NEGATIVE 07/14/2017 2015   LEUKOCYTESUR LARGE (A) 07/14/2017 2015     STUDIES:  No results found.   ELIGIBLE FOR AVAILABLE RESEARCH PROTOCOL: no   ASSESSMENT: 73 y.o. [place], [place] woman status post left breast lower inner quadrant biopsy 08/01/2018 for a clinical T1c N0, stage IB invasive ductal carcinoma, grade 2, triple  negative, with an MIB-1 of 40%  (1) genetics testing 08/29/2018 through with Hereditary Gene Panel offered by [facility] found no deleterious mutations in APC, ATM, AXIN2, BARD1, BMPR1A, BRCA1, BRCA2, BRIP1, CDH1, CDK4, CDKN2A (p14ARF), CDKN2A (p16INK4a), CHEK2, CTNNA1, DICER1, EPCAM (Deletion/duplication testing only), GREM1 (promoter region deletion/duplication testing only), KIT, MEN1, MLH1, MSH2, MSH3, MSH6, MUTYH, NBN, NF1, NHTL1, PALB2, PDGFRA, PMS2, POLD1, POLE, PTEN, RAD50, RAD51C, RAD51D, SDHB, SDHC, SDHD, SMAD4, SMARCA4. STK11, TP53, TSC1, TSC2, and VHL.  The following genes were evaluated for sequence changes only: SDHA and HOXB13 c.251G>A variant only.   (a) MLH1 c.1890T>G VUS identified  (2) status post left lumpectomy with sentinel lymph node sampling 09/01/2022 a pT1c pN0, stage IB invasive ductal carcinoma, grade 2, with negative margins.  (a) a total of 3 sentinel lymph nodes were removed  (3) chemotherapy  consisting of cyclophosphamide, methotrexate, and fluorouracil (CMF), repeated every 21 days x 8, starting 10/24/2018  (4) adjuvant radiation given concurrently with cycles 3-4-5 of chemotherapy from 12/05/2018-01/19/2019.  The left breast was treated to 50.4 Gy in 28 fractions, followed by a 10 Gy boost to the lumpectomy cavity.   PLAN: Gloria Mccoy is doing well today.  She is tolerating her treatment well, and her breast continues to heal from her radiation.  She received a flu and shingles vaccine yesterday.  I reviewed that with Dr. Jana Hakim.  He would like for her to wait one week to receive her treatment, because he doesn't think she is going to receive any benefit from her immunizations if she receives treatment today.  I explained this to her and she verbalized understanding.  She will continue with her activity level which is good, and will take tylenol if needed for her breast pain.  We will see her back in 1 week for labs, f/u, and her sixth cycle of CMF chemotherapy.  She was  recommended to continue with the appropriate pandemic precautions.  She knows to call for any questions that may arise between now and her next appointment.  We are happy to see her sooner if needed.   A total of (30) minutes of face-to-face time was spent with this patient with greater than 50% of that time in counseling and care-coordination.   Wilber Bihari, NP  02/15/19 11:44 AM Medical Oncology and Hematology [facility] [address] [place], [place] [address] Tel. [phone]    Fax. [phone]

## 2019-02-16 ENCOUNTER — Telehealth: Payer: Self-pay | Admitting: Adult Health

## 2019-02-16 NOTE — Telephone Encounter (Signed)
I left a message regarding schedule  

## 2019-02-18 ENCOUNTER — Other Ambulatory Visit: Payer: Self-pay | Admitting: Medical

## 2019-02-19 DIAGNOSIS — E782 Mixed hyperlipidemia: Secondary | ICD-10-CM | POA: Diagnosis not present

## 2019-02-19 DIAGNOSIS — E063 Autoimmune thyroiditis: Secondary | ICD-10-CM | POA: Diagnosis not present

## 2019-02-19 DIAGNOSIS — E7849 Other hyperlipidemia: Secondary | ICD-10-CM | POA: Diagnosis not present

## 2019-02-19 DIAGNOSIS — I10 Essential (primary) hypertension: Secondary | ICD-10-CM | POA: Diagnosis not present

## 2019-02-20 ENCOUNTER — Other Ambulatory Visit: Payer: Self-pay

## 2019-02-20 MED ORDER — ATORVASTATIN CALCIUM 80 MG PO TABS: 80.0000 mg | ORAL_TABLET | Freq: Every day | ORAL | 3 refills | Status: DC

## 2019-02-22 ENCOUNTER — Inpatient Hospital Stay (HOSPITAL_BASED_OUTPATIENT_CLINIC_OR_DEPARTMENT_OTHER): Payer: Medicare HMO | Admitting: Adult Health

## 2019-02-22 ENCOUNTER — Encounter: Payer: Self-pay | Admitting: Adult Health

## 2019-02-22 ENCOUNTER — Inpatient Hospital Stay: Payer: Medicare HMO | Attending: Genetic Counselor

## 2019-02-22 ENCOUNTER — Inpatient Hospital Stay: Payer: Medicare HMO

## 2019-02-22 ENCOUNTER — Other Ambulatory Visit: Payer: Self-pay

## 2019-02-22 VITALS — BP 155/46 | HR 73 | Temp 98.7°F | Resp 18 | Ht 62.0 in | Wt 206.6 lb

## 2019-02-22 DIAGNOSIS — Z79899 Other long term (current) drug therapy: Secondary | ICD-10-CM | POA: Insufficient documentation

## 2019-02-22 DIAGNOSIS — Z8249 Family history of ischemic heart disease and other diseases of the circulatory system: Secondary | ICD-10-CM | POA: Diagnosis not present

## 2019-02-22 DIAGNOSIS — H109 Unspecified conjunctivitis: Secondary | ICD-10-CM | POA: Insufficient documentation

## 2019-02-22 DIAGNOSIS — C50312 Malignant neoplasm of lower-inner quadrant of left female breast: Secondary | ICD-10-CM | POA: Diagnosis not present

## 2019-02-22 DIAGNOSIS — I10 Essential (primary) hypertension: Secondary | ICD-10-CM | POA: Insufficient documentation

## 2019-02-22 DIAGNOSIS — C50512 Malignant neoplasm of lower-outer quadrant of left female breast: Secondary | ICD-10-CM

## 2019-02-22 DIAGNOSIS — Z882 Allergy status to sulfonamides status: Secondary | ICD-10-CM | POA: Insufficient documentation

## 2019-02-22 DIAGNOSIS — I2510 Atherosclerotic heart disease of native coronary artery without angina pectoris: Secondary | ICD-10-CM | POA: Diagnosis not present

## 2019-02-22 DIAGNOSIS — Z5111 Encounter for antineoplastic chemotherapy: Secondary | ICD-10-CM | POA: Diagnosis not present

## 2019-02-22 DIAGNOSIS — Z171 Estrogen receptor negative status [ER-]: Secondary | ICD-10-CM | POA: Diagnosis not present

## 2019-02-22 DIAGNOSIS — Z823 Family history of stroke: Secondary | ICD-10-CM | POA: Diagnosis not present

## 2019-02-22 DIAGNOSIS — Z800 Family history of malignant neoplasm of digestive organs: Secondary | ICD-10-CM | POA: Insufficient documentation

## 2019-02-22 DIAGNOSIS — Z833 Family history of diabetes mellitus: Secondary | ICD-10-CM | POA: Insufficient documentation

## 2019-02-22 DIAGNOSIS — Z923 Personal history of irradiation: Secondary | ICD-10-CM | POA: Diagnosis not present

## 2019-02-22 DIAGNOSIS — Z803 Family history of malignant neoplasm of breast: Secondary | ICD-10-CM | POA: Insufficient documentation

## 2019-02-22 DIAGNOSIS — I252 Old myocardial infarction: Secondary | ICD-10-CM | POA: Diagnosis not present

## 2019-02-22 DIAGNOSIS — R5383 Other fatigue: Secondary | ICD-10-CM | POA: Diagnosis not present

## 2019-02-22 DIAGNOSIS — Z7289 Other problems related to lifestyle: Secondary | ICD-10-CM | POA: Diagnosis not present

## 2019-02-22 LAB — COMPREHENSIVE METABOLIC PANEL
ALT: 13 U/L (ref 0–44)
AST: 20 U/L (ref 15–41)
Albumin: 3.4 g/dL — ABNORMAL LOW (ref 3.5–5.0)
Alkaline Phosphatase: 73 U/L (ref 38–126)
Anion gap: 6 (ref 5–15)
BUN: 10 mg/dL (ref 8–23)
CO2: 31 mmol/L (ref 22–32)
Calcium: 8.8 mg/dL — ABNORMAL LOW (ref 8.9–10.3)
Chloride: 107 mmol/L (ref 98–111)
Creatinine, Ser: 0.82 mg/dL (ref 0.44–1.00)
GFR calc Af Amer: >60 mL/min (ref >60)
GFR calc non Af Amer: >60 mL/min (ref >60)
Glucose, Bld: 116 mg/dL — ABNORMAL HIGH (ref 70–99)
Potassium: 3.4 mmol/L — ABNORMAL LOW (ref 3.5–5.1)
Sodium: 144 mmol/L (ref 135–145)
Total Bilirubin: 0.6 mg/dL (ref 0.3–1.2)
Total Protein: 6.6 g/dL (ref 6.5–8.1)

## 2019-02-22 LAB — CBC WITH DIFFERENTIAL/PLATELET
Abs Immature Granulocytes: 0.03 10*3/uL (ref 0.00–0.07)
Basophils Absolute: 0.1 10*3/uL (ref 0.0–0.1)
Basophils Relative: 1 %
Eosinophils Absolute: 0.4 10*3/uL (ref 0.0–0.5)
Eosinophils Relative: 6 %
HCT: 34.3 % — ABNORMAL LOW (ref 36.0–46.0)
Hemoglobin: 12 g/dL (ref 12.0–15.0)
Immature Granulocytes: 0 %
Lymphocytes Relative: 23 %
Lymphs Abs: 1.8 10*3/uL (ref 0.7–4.0)
MCH: 29.9 pg (ref 26.0–34.0)
MCHC: 35 g/dL (ref 30.0–36.0)
MCV: 85.3 fL (ref 80.0–100.0)
Monocytes Absolute: 1.2 10*3/uL — ABNORMAL HIGH (ref 0.1–1.0)
Monocytes Relative: 16 %
Neutro Abs: 4 10*3/uL (ref 1.7–7.7)
Neutrophils Relative %: 54 %
Platelets: 187 10*3/uL (ref 150–400)
RBC: 4.02 MIL/uL (ref 3.87–5.11)
RDW: 15.4 % (ref 11.5–15.5)
WBC: 7.5 10*3/uL (ref 4.0–10.5)
nRBC: 0 % (ref 0.0–0.2)

## 2019-02-22 MED ORDER — PALONOSETRON HCL INJECTION 0.25 MG/5ML
0.2500 mg | Freq: Once | INTRAVENOUS | Status: AC
  Administered 2019-02-22: 0.25 mg via INTRAVENOUS

## 2019-02-22 MED ORDER — SODIUM CHLORIDE 0.9 % IV SOLN
Freq: Once | INTRAVENOUS | Status: AC
  Administered 2019-02-22: 15:00:00 via INTRAVENOUS
  Filled 2019-02-22: qty 250

## 2019-02-22 MED ORDER — TOBRAMYCIN-DEXAMETHASONE 0.3-0.1 % OP SUSP: 2.0000 [drp] | OPHTHALMIC | 0 refills | Status: DC

## 2019-02-22 MED ORDER — SODIUM CHLORIDE 0.9% FLUSH
10.0000 mL | INTRAVENOUS | Status: DC | PRN
  Administered 2019-02-22: 17:00:00 10 mL
  Filled 2019-02-22: qty 10

## 2019-02-22 MED ORDER — FLUOROURACIL CHEMO INJECTION 2.5 GM/50ML
600.0000 mg/m2 | Freq: Once | INTRAVENOUS | Status: AC
  Administered 2019-02-22: 1200 mg via INTRAVENOUS
  Filled 2019-02-22: qty 24

## 2019-02-22 MED ORDER — METHOTREXATE SODIUM (PF) CHEMO INJECTION 250 MG/10ML
80.0000 mg | Freq: Once | INTRAMUSCULAR | Status: AC
  Administered 2019-02-22: 17:00:00 80 mg via INTRAVENOUS
  Filled 2019-02-22: qty 3.2

## 2019-02-22 MED ORDER — HEPARIN SOD (PORK) LOCK FLUSH 100 UNIT/ML IV SOLN
500.0000 [IU] | Freq: Once | INTRAVENOUS | Status: AC | PRN
  Administered 2019-02-22: 500 [IU]
  Filled 2019-02-22: qty 5

## 2019-02-22 MED ORDER — DEXAMETHASONE SODIUM PHOSPHATE 10 MG/ML IJ SOLN
INTRAMUSCULAR | Status: AC
  Filled 2019-02-22: qty 1

## 2019-02-22 MED ORDER — SODIUM CHLORIDE 0.9 % IV SOLN
600.0000 mg/m2 | Freq: Once | INTRAVENOUS | Status: AC
  Administered 2019-02-22: 1200 mg via INTRAVENOUS
  Filled 2019-02-22: qty 60

## 2019-02-22 MED ORDER — PALONOSETRON HCL INJECTION 0.25 MG/5ML
INTRAVENOUS | Status: AC
  Filled 2019-02-22: qty 5

## 2019-02-22 MED ORDER — DEXAMETHASONE SODIUM PHOSPHATE 10 MG/ML IJ SOLN
10.0000 mg | Freq: Once | INTRAMUSCULAR | Status: AC
  Administered 2019-02-22: 10 mg via INTRAVENOUS

## 2019-02-22 MED FILL — TOBRAMYCIN-DEXAMETH OPTH SU: 0.3-0.1 | 8 days supply | Qty: 5 | Fill #0

## 2019-02-22 NOTE — Progress Notes (Signed)
[place]  [phone] Fax:([phone]) [phone]    ID: ZORAIDA HAVRILLA DOB: 1945-07-03  MR#: [id number]  PYK#:[id number]  Patient Care Team: Redmond School, MD as PCP - General (Internal Medicine) Harl Bowie, Alphonse Guild, MD as PCP - Cardiology (Cardiology) Magrinat, Virgie Dad, MD as Consulting Physician (Oncology) Kyung Rudd, MD as Consulting Physician (Radiation Oncology) Rutherford Guys, MD as Consulting Physician (Ophthalmology) Jovita Kussmaul, MD as Consulting Physician (General Surgery) Mauro Kaufmann, RN as Oncology Nurse Navigator Rockwell Germany, RN as Oncology Nurse Navigator OTHER MD:    CHIEF COMPLAINT: Triple negative breast cancer  CURRENT TREATMENT: Adjuvant chemotherapy, adjuvant radiation   INTERVAL HISTORY: Zanasia was seen today for follow-up and treatment of her triple negative breast cancer.   Margaretann completed radiation on 7/31 and is healing well from this.  She continues on adjuvant chemotherapy consisting of cyclophosphamide, methotrexate, and fluorouracil (CMF), repeated every 21 days x 8. She is currently cycle 6 day 1 of her therapy.  Her chemo was held last week because she received a flu shot and shingles vaccine.    REVIEW OF SYSTEMS: Gustie is feeling well.  Her skin is healing well and she is applying lotion to this regularly.  Her eye is itching and this started yesterday.  She continues to follow pandemic precautions.  She denies any new pain, fever, chills, chest pain, palpitations, or shortness of breath.  She hasn't had any bowel/bladder changes, nausea, vomiting.  A detailed ROS was otherwise non contributory.    HISTORY OF CURRENT ILLNESS: From the original intake note:  CHRISANN MELARAGNO had routine screening mammography on 07/12/2018 showing a possible abnormality in the left breast. She underwent unilateral left diagnostic mammography with tomography and left breast ultrasonography at [facility] on 07/25/2018  showing: Breast Density Category B. There is a mass with indistinct margins in the lower inner posterior left breast measuring approximately 0.6 cm. Targeted ultrasound of the lower left breast was performed. There is a hypoechoic mass with margin irregularity in the left breast at 7 o'clock, 7 cm from the nipple measuring 0.7 x 0.5 x 0.6 cm. No lymphadenopathy seen in the left axilla.   Accordingly on 08/01/2018 she proceeded to biopsy of the left breast area in question. The pathology from this procedure showed ([id number]): invasive ductal carcinoma, grade II. Prognostic indicators significant for: estrogen receptor, 0% negative and progesterone receptor, 0% negative. Proliferation marker Ki67 at 40%. HER2 negative (1+) by immunohistochemistry.   The patient's subsequent history is as detailed below.   PAST MEDICAL HISTORY: Past Medical History:  Diagnosis Date  . Arthritis    "hands sometimes" (07/13/2017)  . Coronary artery disease    a. s/p CABG x3 in 06/2017 with LIMA-LAD, SVG-D1, and SVG-RI.  b. 10/2017: cath showing a widely patent LIMA-LAD with ostial occlusion of the SVG-RI and subtotally occluded atretic SVG-D1. Graft occlusion thought to be 2ry to improvement in pre-CABG stenoses.   . Essential hypertension   . Family history of adverse reaction to anesthesia    sister had a complication that was stated she had a "foggy" episode after her breast surgery was readmitted 1 day post op after being discharged from the hospital. Sister also has significant lung problems that could have contributed to this   . Family history of breast cancer   . Family history of colon cancer   . Hypothyroid   . Meniere disease   . Myocardial infarction ([place]) 06/2017   during cardaic rehab  .  Obesity   . Osteopenia 01/2012   T score -1.3 FRAX 7.9%/0.6%     PAST SURGICAL HISTORY: Past Surgical History:  Procedure Laterality Date  . BREAST LUMPECTOMY WITH RADIOACTIVE SEED AND SENTINEL LYMPH NODE  BIOPSY Left 09/01/2018   Procedure: LEFT BREAST LUMPECTOMY WITH RADIOACTIVE SEED AND SENTINEL LYMPH NODE BIOPSY;  Surgeon: Jovita Kussmaul, MD;  Location: [facility];  Service: General;  Laterality: Left;  . BREAST SURGERY    . CARDIAC CATHETERIZATION  07/13/2017  . CATARACT EXTRACTION W/PHACO Right 01/28/2015   Procedure: CATARACT EXTRACTION PHACO AND INTRAOCULAR LENS PLACEMENT :  CDE:  5.70;  Surgeon: Rutherford Guys, MD;  Location: [facility];  Service: Ophthalmology;  Laterality: Right;  . CATARACT EXTRACTION W/PHACO Left 02/11/2015   Procedure: CATARACT EXTRACTION PHACO AND INTRAOCULAR LENS PLACEMENT (IOC);  Surgeon: Rutherford Guys, MD;  Location: [facility];  Service: Ophthalmology;  Laterality: Left;  CDE: 7.38  . COLONOSCOPY N/A 09/26/2012   Procedure: COLONOSCOPY;  Surgeon: Jamesetta So, MD;  [facility];  Service: Gastroenterology;  Laterality: N/A;  . CORONARY ARTERY BYPASS GRAFT N/A 07/15/2017   Procedure: CORONARY ARTERY BYPASS GRAFTING (CABG) X 3 USING LEFT INTERNAL MAMMARY ARTERY AND RIGHT SAPHENOUS VEIN- ENDOSCOPICALLY HARVESTED;  Surgeon: Melrose Nakayama, MD;  Location: [facility];  Service: Open Heart Surgery;  Laterality: N/A;  . IR IMAGING GUIDED PORT INSERTION  12/29/2018  . LEFT HEART CATH AND CORONARY ANGIOGRAPHY N/A 07/13/2017   Procedure: LEFT HEART CATH AND CORONARY ANGIOGRAPHY;  Surgeon: Martinique, Peter M, MD;  Location: [facility];  Service: Cardiovascular;  Laterality: N/A;  . LEFT HEART CATH AND CORS/GRAFTS ANGIOGRAPHY N/A 11/03/2017   Procedure: LEFT HEART CATH AND CORS/GRAFTS ANGIOGRAPHY;  Surgeon: Troy Sine, MD;  Location: [facility];  Service: Cardiovascular;  Laterality: N/A;  . TEE WITHOUT CARDIOVERSION N/A 07/15/2017   Procedure: TRANSESOPHAGEAL ECHOCARDIOGRAM (TEE);  Surgeon: Melrose Nakayama, MD;  Location: [facility];  Service: Open Heart Surgery;  Laterality: N/A;  . TUBAL LIGATION    . TYMPANOPLASTY Left    fluid from ear drum     FAMILY  HISTORY: Family History  Problem Relation Age of Onset  . Diabetes Sister        AODM  . Heart disease Sister 31       CABG  . Breast cancer Sister   . Heart disease Brother 93       In his 52s  . Colon cancer Maternal Uncle 63  . Diabetes Sister        AODM  . Hypertension Daughter   . Hypertension Daughter   . Breast cancer Cousin        PATERNAL COUSIN  . Heart attack Father        In his 79s  . Stroke Mother   . Stroke Maternal Grandmother   . Diabetes Maternal Grandfather        d. 109  . Heart attack Paternal Grandmother   . Colon cancer Maternal Uncle 36  . Breast cancer Cousin        dx in her 11s; mat first cousin  . Breast cancer Cousin        dx 28s; d. 9s  . Breast cancer Niece 32   Pamella's father died from a myocardial infarction at age 33. Patients' mother died from a stroke at age 73. The patient has 3 brothers and 9 sisters. One of Adhya's sisters, [name], was diagnosed with breast cancer at  the age of 45. Makinzee also has a niece that was diagnosed with breast cancer at 59, and two cousins that were diagnosed with breast cancer.  She believes 1 of her brothers may have prostate cancer.  Patient denies anyone in her family having ovarian or pancreatic cancer. Kellianne has an uncle that was diagnosed with colon cancer and an uncle that was diagnosed with "stomach" cancer.    GYNECOLOGIC HISTORY:  No LMP recorded. Patient is postmenopausal. 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 Her husband, Jason Fila, is a retired Administrator. They have two daughters. Their daughter, [name], lives in [place] and is a housewife.  Their other daughter works at [employer]. Zorina had one grandchild that is now deceased. She attends a [employer].    ADVANCED DIRECTIVES: Evella's husband,  Jason Fila, is automatically her healthcare power of attorney.     HEALTH MAINTENANCE: Social History   Tobacco Use  . Smoking status: Never Smoker  . Smokeless tobacco: Former Systems developer    Types: Snuff  Substance Use Topics  . Alcohol use: Yes    Alcohol/week: 1.0 standard drinks    Types: 1 Standard drinks or equivalent per week    Comment: occasionally  . Drug use: No    Colonoscopy: yes, Dr. Arnoldo Morale  PAP:   Bone density: yes, 2016, -1.1 osteopenic   Allergies  Allergen Reactions  . Aloe Vera Itching    Per patient severe itching  . Sulfa Antibiotics Shortness Of Breath  . Sulfasalazine Shortness Of Breath    Current Outpatient Medications  Medication Sig Dispense Refill  . acetaminophen (TYLENOL) 325 MG tablet Take 2 tablets (650 mg total) by mouth every 6 (six) hours as needed for mild pain.    Marland Kitchen aspirin EC 81 MG tablet Take 81 mg by mouth daily.    Marland Kitchen atorvastatin (LIPITOR) 80 MG tablet Take 1 tablet (80 mg total) by mouth daily at 6 PM. 90 tablet 3  . Cholecalciferol (VITAMIN D PO) Take 1 tablet by mouth daily.     . clopidogrel (PLAVIX) 75 MG tablet Take 1 tablet by mouth once daily 90 tablet 3  . Cyanocobalamin (VITAMIN B-12 PO) Take 1 tablet by mouth daily.    Marland Kitchen dexamethasone (DECADRON) 4 MG tablet Take 1 tablet (4 mg total) by mouth daily. Start the day after chemotherapy for 2 days. Take with food. 30 tablet 1  . fish oil-omega-3 fatty acids 1000 MG capsule Take 1 g by mouth daily.    . furosemide (LASIX) 40 MG tablet Take 1 tablet (40 mg total) by mouth daily. 30 tablet 1  . HYDROcodone-acetaminophen (NORCO/VICODIN) 5-325 MG tablet Take 1-2 tablets by mouth every 6 (six) hours as needed for moderate pain or severe pain. 60 tablet 0  . levothyroxine (SYNTHROID) 25 MCG tablet     . levothyroxine (SYNTHROID, LEVOTHROID) 75 MCG tablet Take 75 mcg by mouth daily.    Marland Kitchen lidocaine-prilocaine (EMLA) cream Apply 1 application topically as needed. 30 g 0  . meloxicam (MOBIC) 15 MG  tablet Take 15 mg by mouth daily.    . metoprolol tartrate (LOPRESSOR) 25 MG tablet Take 1 tablet (25 mg total) by mouth 2 (two) times daily. 180 tablet 3  . prochlorperazine (COMPAZINE) 10 MG tablet Take 1 tablet (10 mg  total) by mouth 3 (three) times daily before meals. 30 tablet 1  . VITAMIN E PO Take 1 tablet by mouth daily.     Marland Kitchen losartan (COZAAR) 25 MG tablet Take 0.5 tablets (12.5 mg total) by mouth daily. 45 tablet 3  . tobramycin-dexamethasone (TOBRADEX) ophthalmic solution Place 2 drops into the right eye every 4 (four) hours while awake. 5 mL 0   No current facility-administered medications for this visit.      OBJECTIVE:  Vitals:   02/22/19 1313  BP: (!) 155/46  Pulse: 73  Resp: 18  Temp: 98.7 F (37.1 C)  SpO2: 94%     Body mass index is 37.79 kg/m.   Wt Readings from Last 3 Encounters:  02/22/19 206 lb 9.6 oz (93.7 kg)  02/15/19 205 lb (93 kg)  01/24/19 209 lb 8 oz (95 kg)  ECOG FS:1 - Symptomatic but completely ambulatory GENERAL: Patient is a well appearing female in no acute distress HEENT:  Sclerae anicteric.  Right eye sclera is injected, draining clear to purulent drainage.  Oropharynx clear and moist. No ulcerations or evidence of oropharyngeal candidiasis. Neck is supple.  NODES:  No cervical, supraclavicular, or axillary lymphadenopathy palpated.  BREAST EXAM:  deferred LUNGS:  Clear to auscultation bilaterally.  No wheezes or rhonchi. HEART:  Regular rate and rhythm. No murmur appreciated. ABDOMEN:  Soft, nontender.  Positive, normoactive bowel sounds. No organomegaly palpated. MSK:  No focal spinal tenderness to palpation. Full range of motion bilaterally in the upper extremities. EXTREMITIES:  Scant bilateral lower extremity edema SKIN:  Clear with no obvious rashes or skin changes. No nail dyscrasia. NEURO:  Nonfocal. Well oriented.  Appropriate affect.     LAB RESULTS:  CMP     Component Value Date/Time   NA 142 02/15/2019 1102   K 3.1 (L)  02/15/2019 1102   CL 107 02/15/2019 1102   CO2 26 02/15/2019 1102   GLUCOSE 124 (H) 02/15/2019 1102   BUN 9 02/15/2019 1102   CREATININE 0.83 02/15/2019 1102   CREATININE 0.78 01/16/2019 0840   CALCIUM 8.5 (L) 02/15/2019 1102   PROT 6.9 02/15/2019 1102   ALBUMIN 3.5 02/15/2019 1102   AST 18 02/15/2019 1102   AST 22 01/16/2019 0840   ALT 12 02/15/2019 1102   ALT 10 01/16/2019 0840   ALKPHOS 74 02/15/2019 1102   BILITOT 1.1 02/15/2019 1102   BILITOT 0.7 01/16/2019 0840   GFRNONAA >60 02/15/2019 1102   GFRNONAA >60 01/16/2019 0840   GFRAA >60 02/15/2019 1102   GFRAA >60 01/16/2019 0840    No results found for: TOTALPROTELP, ALBUMINELP, A1GS, A2GS, BETS, BETA2SER, GAMS, MSPIKE, SPEI  No results found for: KPAFRELGTCHN, LAMBDASER, KAPLAMBRATIO  Lab Results  Component Value Date   WBC 7.5 02/22/2019   NEUTROABS 4.0 02/22/2019   HGB 12.0 02/22/2019   HCT 34.3 (L) 02/22/2019   MCV 85.3 02/22/2019   PLT 187 02/22/2019    '@LASTCHEMISTRY' @  No results found for: LABCA2  No components found for: [id number]  No results for input(s): INR in the last 168 hours.  No results found for: LABCA2  No results found for: DDU202  No results found for: RKY706  No results found for: CBJ628  No results found for: CA2729  No components found for: HGQUANT  No results found for: CEA1 / No results found for: CEA1   No results found for: AFPTUMOR  No results found for: [place]  No results found for: PSA1  Appointment on 02/22/2019  Component Date Value Ref Range Status  . WBC 02/22/2019 7.5  4.0 - 10.5 K/uL Final  . RBC 02/22/2019 4.02  3.87 - 5.11 MIL/uL Final  . Hemoglobin 02/22/2019 12.0  12.0 - 15.0 g/dL Final  . HCT 02/22/2019 34.3* 36.0 - 46.0 % Final  . MCV 02/22/2019 85.3  80.0 - 100.0 fL Final  . MCH 02/22/2019 29.9  26.0 - 34.0 pg Final  . MCHC 02/22/2019 35.0  30.0 - 36.0 g/dL Final  . RDW 02/22/2019 15.4  11.5 - 15.5 % Final  . Platelets 02/22/2019 187  150 -  400 K/uL Final  . nRBC 02/22/2019 0.0  0.0 - 0.2 % Final  . Neutrophils Relative % 02/22/2019 54  % Final  . Neutro Abs 02/22/2019 4.0  1.7 - 7.7 K/uL Final  . Lymphocytes Relative 02/22/2019 23  % Final  . Lymphs Abs 02/22/2019 1.8  0.7 - 4.0 K/uL Final  . Monocytes Relative 02/22/2019 16  % Final  . Monocytes Absolute 02/22/2019 1.2* 0.1 - 1.0 K/uL Final  . Eosinophils Relative 02/22/2019 6  % Final  . Eosinophils Absolute 02/22/2019 0.4  0.0 - 0.5 K/uL Final  . Basophils Relative 02/22/2019 1  % Final  . Basophils Absolute 02/22/2019 0.1  0.0 - 0.1 K/uL Final  . Immature Granulocytes 02/22/2019 0  % Final  . Abs Immature Granulocytes 02/22/2019 0.03  0.00 - 0.07 K/uL Final   Performed at [facility], [address] [place], [place] [address]    (this displays the last labs from the last 3 days)  No results found for: TOTALPROTELP, ALBUMINELP, A1GS, A2GS, BETS, BETA2SER, GAMS, MSPIKE, SPEI (this displays SPEP labs)  No results found for: KPAFRELGTCHN, LAMBDASER, KAPLAMBRATIO (kappa/lambda light chains)  No results found for: HGBA, HGBA2QUANT, HGBFQUANT, HGBSQUAN (Hemoglobinopathy evaluation)   No results found for: LDH  No results found for: IRON, TIBC, IRONPCTSAT (Iron and TIBC)  No results found for: FERRITIN  Urinalysis    Component Value Date/Time   COLORURINE YELLOW 07/14/2017 2015   APPEARANCEUR HAZY (A) 07/14/2017 2015   LABSPEC 1.011 07/14/2017 2015   PHURINE 6.0 07/14/2017 2015   [place] 07/14/2017 2015   [place] 07/14/2017 2015   [place] 07/14/2017 2015   [place] 07/14/2017 2015   PROTEINUR NEGATIVE 07/14/2017 2015   UROBILINOGEN 0.2 07/03/2014 0852   NITRITE NEGATIVE 07/14/2017 2015   LEUKOCYTESUR LARGE (A) 07/14/2017 2015     STUDIES:  No results found.   ELIGIBLE FOR AVAILABLE RESEARCH PROTOCOL: no   ASSESSMENT: 73 y.o. [place], [place] woman status post left breast lower inner  quadrant biopsy 08/01/2018 for a clinical T1c N0, stage IB invasive ductal carcinoma, grade 2, triple negative, with an MIB-1 of 40%  (1) genetics testing 08/29/2018 through with Hereditary Gene Panel offered by [facility] found no deleterious mutations in APC, ATM, AXIN2, BARD1, BMPR1A, BRCA1, BRCA2, BRIP1, CDH1, CDK4, CDKN2A (p14ARF), CDKN2A (p16INK4a), CHEK2, CTNNA1, DICER1, EPCAM (Deletion/duplication testing only), GREM1 (promoter region deletion/duplication testing only), KIT, MEN1, MLH1, MSH2, MSH3, MSH6, MUTYH, NBN, NF1, NHTL1, PALB2, PDGFRA, PMS2, POLD1, POLE, PTEN, RAD50, RAD51C, RAD51D, SDHB, SDHC, SDHD, SMAD4, SMARCA4. STK11, TP53, TSC1, TSC2, and VHL.  The following genes were evaluated for sequence changes only: SDHA and HOXB13 c.251G>A variant only.   (a) MLH1 c.1890T>G VUS identified  (2) status post left lumpectomy with sentinel lymph node sampling 09/01/2022 a pT1c pN0, stage IB invasive ductal carcinoma, grade 2, with negative margins.  (a) a total of  3 sentinel lymph nodes were removed  (3) chemotherapy consisting of cyclophosphamide, methotrexate, and fluorouracil (CMF), repeated every 21 days x 8, starting 10/24/2018  (4) adjuvant radiation given concurrently with cycles 3-4-5 of chemotherapy from 12/05/2018-01/19/2019.  The left breast was treated to 50.4 Gy in 28 fractions, followed by a 10 Gy boost to the lumpectomy cavity.   PLAN: Florette is doing well today.  Her labs are stable and she is tolerating her adjuvant CMF chemotherapy well.  She will proceed with her sixth cycle of this today.  Kaori is applying lotion to her breast where she had radiation, and it is healing well.  I recommended she continue to do this.  I think the issue with the eye is bacterial conjunctivitis.  She has no vision impairment.  I placed an order for tobradex eye gtts today.    Dafney will return in 3 weeks for labs, f/u, and her seventh cycle of CMF chemotherapy.  She was recommended to  continue with the appropriate pandemic precautions.  She knows to call for any questions that may arise between now and her next appointment.  We are happy to see her sooner if needed.   A total of (20) minutes of face-to-face time was spent with this patient with greater than 50% of that time in counseling and care-coordination.   Wilber Bihari, NP  02/22/19 1:42 PM Medical Oncology and Hematology [facility] [address] [place], [place] [address] Tel. [phone]    Fax. [phone]

## 2019-02-22 NOTE — Patient Instructions (Signed)
[facility] Discharge Instructions for Patients Receiving Chemotherapy  Today you received the following chemotherapy agents Cyclophosphamide (CYTOXAN), Methotrexate (PF) & Flourouracil (ADRUCIL).  To help prevent nausea and vomiting after your treatment, we encourage you to take your nausea medication as prescribed.   If you develop nausea and vomiting that is not controlled by your nausea medication, call the clinic.   BELOW ARE SYMPTOMS THAT SHOULD BE REPORTED IMMEDIATELY:  *FEVER GREATER THAN 100.5 F  *CHILLS WITH OR WITHOUT FEVER  NAUSEA AND VOMITING THAT IS NOT CONTROLLED WITH YOUR NAUSEA MEDICATION  *UNUSUAL SHORTNESS OF BREATH  *UNUSUAL BRUISING OR BLEEDING  TENDERNESS IN MOUTH AND THROAT WITH OR WITHOUT PRESENCE OF ULCERS  *URINARY PROBLEMS  *BOWEL PROBLEMS  UNUSUAL RASH Items with * indicate a potential emergency and should be followed up as soon as possible.  Feel free to call the clinic should you have any questions or concerns. The clinic phone number is [phone].  Please show the CHEMO ALERT CARD at check-in to the Emergency Department and triage nurse.  Coronavirus (COVID-19) Are you at risk?  Are you at risk for the Coronavirus (COVID-19)?  To be considered HIGH RISK for Coronavirus (COVID-19), you have to meet the following criteria:  . Traveled to [place], [place], [place], [place] or [place]; or in the [place] to [place], [place], [place], or [place]; and have fever, cough, and shortness of breath within the last 2 weeks of travel OR . Been in close contact with a person diagnosed with COVID-19 within the last 2 weeks and have fever, cough, and shortness of breath . IF YOU DO NOT MEET THESE CRITERIA, YOU ARE CONSIDERED LOW RISK FOR COVID-19.  What to do if you are HIGH RISK for COVID-19?  . If you are having a medical emergency, call 911. . Seek medical care right away. Before you go to a doctor's office, urgent care or  emergency department, call ahead and tell them about your recent travel, contact with someone diagnosed with COVID-19, and your symptoms. You should receive instructions from your physician's office regarding next steps of care.  . When you arrive at healthcare provider, tell the healthcare staff immediately you have returned from visiting [place], [place], [place], [place] or [place]; or traveled in the [place] to [place], [place], [place], or [place]; in the last two weeks or you have been in close contact with a person diagnosed with COVID-19 in the last 2 weeks.   . Tell the health care staff about your symptoms: fever, cough and shortness of breath. . After you have been seen by a medical provider, you will be either: o Tested for (COVID-19) and discharged home on quarantine except to seek medical care if symptoms worsen, and asked to  - Stay home and avoid contact with others until you get your results (4-5 days)  - Avoid travel on public transportation if possible (such as bus, train, or airplane) or o Sent to the Emergency Department by EMS for evaluation, COVID-19 testing, and possible admission depending on your condition and test results.  What to do if you are LOW RISK for COVID-19?  Reduce your risk of any infection by using the same precautions used for avoiding the common cold or flu:  . Wash your hands often with soap and warm water for at least 20 seconds.  If soap and water are not readily available, use an alcohol-based hand sanitizer with at least 60% alcohol.  .   If coughing or sneezing, cover your mouth and nose by coughing or sneezing into the elbow areas of your shirt or coat, into a tissue or into your sleeve (not your hands). . Avoid shaking hands with others and consider head nods or verbal greetings only. . Avoid touching your eyes, nose, or mouth with unwashed hands.  . Avoid close contact with people who are sick. . Avoid places or events with large numbers  of people in one location, like concerts or sporting events. . Carefully consider travel plans you have or are making. . If you are planning any travel outside or inside the [place], visit the CDC's Travelers' Health webpage for the latest health notices. . If you have some symptoms but not all symptoms, continue to monitor at home and seek medical attention if your symptoms worsen. . If you are having a medical emergency, call 911.   [place] / e-Visit: [email or web address]         [facility]: [facility]: [phone]                   [facility]: [phone]

## 2019-02-23 ENCOUNTER — Telehealth: Payer: Self-pay | Admitting: Cardiology

## 2019-02-23 NOTE — Telephone Encounter (Signed)

## 2019-02-27 ENCOUNTER — Other Ambulatory Visit: Payer: Self-pay

## 2019-02-27 ENCOUNTER — Telehealth (INDEPENDENT_AMBULATORY_CARE_PROVIDER_SITE_OTHER): Payer: Medicare HMO | Admitting: Cardiology

## 2019-02-27 ENCOUNTER — Encounter: Payer: Self-pay | Admitting: Cardiology

## 2019-02-27 VITALS — BP 137/79 | Ht 66.0 in | Wt 189.0 lb

## 2019-02-27 DIAGNOSIS — I2510 Atherosclerotic heart disease of native coronary artery without angina pectoris: Secondary | ICD-10-CM

## 2019-02-27 DIAGNOSIS — I10 Essential (primary) hypertension: Secondary | ICD-10-CM

## 2019-02-27 DIAGNOSIS — E782 Mixed hyperlipidemia: Secondary | ICD-10-CM

## 2019-02-27 MED ORDER — ATORVASTATIN CALCIUM 80 MG PO TABS: 80.0000 mg | ORAL_TABLET | Freq: Every day | ORAL | 3 refills | Status: DC

## 2019-02-27 MED ORDER — LOSARTAN POTASSIUM 25 MG PO TABS: 12.5000 mg | ORAL_TABLET | Freq: Every day | ORAL | 3 refills | Status: DC

## 2019-02-27 MED ORDER — METOPROLOL TARTRATE 25 MG PO TABS: 25.0000 mg | ORAL_TABLET | Freq: Two times a day (BID) | ORAL | 3 refills | Status: DC

## 2019-02-27 MED ORDER — FUROSEMIDE 40 MG PO TABS: 40.0000 mg | ORAL_TABLET | Freq: Every day | ORAL | 3 refills | Status: DC

## 2019-02-27 NOTE — Telephone Encounter (Signed)
Yes I would continue atorvastatin

## 2019-02-27 NOTE — Progress Notes (Signed)
Virtual Visit via Telephone Note   This visit type was conducted due to national recommendations for restrictions regarding the COVID-19 Pandemic (e.g. social distancing) in an effort to limit this patient's exposure and mitigate transmission in our community.  Due to her co-morbid illnesses, this patient is at least at moderate risk for complications without adequate follow up.  This format is felt to be most appropriate for this patient at this time.  The patient did not have access to video technology/had technical difficulties with video requiring transitioning to audio format only (telephone).  All issues noted in this document were discussed and addressed.  No physical exam could be performed with this format.  Please refer to the patient's chart for her  consent to telehealth for [facility].   Date:  02/27/2019   ID:  Gloria Mccoy, DOB 09-03-1945, MRN [id number]  Patient Location: Home Provider Location: Office  PCP:  Redmond School, MD  Cardiologist:  Carlyle Dolly, MD  Electrophysiologist:  None   Evaluation Performed:  Follow-Up Visit  Chief Complaint:  Follow up   History of Present Illness:    Gloria Mccoy is a 73 y.o. female seen today for follow up of the following medical problems.  1. CAD - admit Jan 2019 with unstable angina. - Jan 2019 with distal LM 80%, prox LAD 90%. She was referred for CABG/ s/p LIMA-LAD, SVG-D1, SVG-RI - Jan 2019 echo LVEF 55-60%  - 10/2017 admitted with chest pain. Admitted with NSTEMI - 10/2017 cath with ostial LAD 45%, mid LAD 80%, D1 40%, ramus 20%, LCX 25%, RCA patent. LIMA-LAD patent, SVG-ramus occluded, SVG-D1 99%. Recs for medical therapy, discharged on DAPT with plans for 1 year of treatment.   - some recent chest painmidchest. Aching pain, 8/10 in severity. Can occur at any time. No other associated. Lasts just a few seconds. Occurs about once a week.       2. Hyperlipidemia - 10/2017 TC 79 TG 52 HDL 25 LDL 44  - most recent labs by pcp - compliant with statin  3. HTN - compliant with meds   4. Breast cancer - s/p lumpectomy -she is on chemoradiation   The patient does not have symptoms concerning for COVID-19 infection (fever, chills, cough, or new shortness of breath).    Past Medical History:  Diagnosis Date  . Arthritis    "hands sometimes" (07/13/2017)  . Coronary artery disease    a. s/p CABG x3 in 06/2017 with LIMA-LAD, SVG-D1, and SVG-RI.  b. 10/2017: cath showing a widely patent LIMA-LAD with ostial occlusion of the SVG-RI and subtotally occluded atretic SVG-D1. Graft occlusion thought to be 2ry to improvement in pre-CABG stenoses.   . Essential hypertension   . Family history of adverse reaction to anesthesia    sister had a complication that was stated she had a "foggy" episode after her breast surgery was readmitted 1 day post op after being discharged from the hospital. Sister also has significant lung problems that could have contributed to this   . Family history of breast cancer   . Family history of colon cancer   . Hypothyroid   . Meniere disease   . Myocardial infarction ([place]) 06/2017   during cardaic rehab  . Obesity   . Osteopenia 01/2012   T score -1.3 FRAX 7.9%/0.6%   Past Surgical History:  Procedure Laterality Date  . BREAST LUMPECTOMY WITH RADIOACTIVE SEED AND SENTINEL LYMPH NODE BIOPSY Left 09/01/2018   Procedure: LEFT BREAST LUMPECTOMY  WITH RADIOACTIVE SEED AND SENTINEL LYMPH NODE BIOPSY;  Surgeon: Jovita Kussmaul, MD;  Location: [facility];  Service: General;  Laterality: Left;  . BREAST SURGERY    . CARDIAC CATHETERIZATION  07/13/2017  . CATARACT EXTRACTION W/PHACO Right 01/28/2015   Procedure: CATARACT EXTRACTION PHACO AND INTRAOCULAR LENS PLACEMENT :  CDE:  5.70;  Surgeon: Rutherford Guys, MD;  Location: [facility];  Service: Ophthalmology;  Laterality: Right;  . CATARACT EXTRACTION W/PHACO Left 02/11/2015   Procedure: CATARACT EXTRACTION PHACO AND INTRAOCULAR  LENS PLACEMENT (IOC);  Surgeon: Rutherford Guys, MD;  Location: [facility];  Service: Ophthalmology;  Laterality: Left;  CDE: 7.38  . COLONOSCOPY N/A 09/26/2012   Procedure: COLONOSCOPY;  Surgeon: Jamesetta So, MD;  [facility];  Service: Gastroenterology;  Laterality: N/A;  . CORONARY ARTERY BYPASS GRAFT N/A 07/15/2017   Procedure: CORONARY ARTERY BYPASS GRAFTING (CABG) X 3 USING LEFT INTERNAL MAMMARY ARTERY AND RIGHT SAPHENOUS VEIN- ENDOSCOPICALLY HARVESTED;  Surgeon: Melrose Nakayama, MD;  Location: [facility] [facility];  Service: Open Heart Surgery;  Laterality: N/A;  . IR IMAGING GUIDED PORT INSERTION  12/29/2018  . LEFT HEART CATH AND CORONARY ANGIOGRAPHY N/A 07/13/2017   Procedure: LEFT HEART CATH AND CORONARY ANGIOGRAPHY;  Surgeon: Martinique, Peter M, MD;  Location: [facility];  Service: Cardiovascular;  Laterality: N/A;  . LEFT HEART CATH AND CORS/GRAFTS ANGIOGRAPHY N/A 11/03/2017   Procedure: LEFT HEART CATH AND CORS/GRAFTS ANGIOGRAPHY;  Surgeon: Troy Sine, MD;  Location: [facility];  Service: Cardiovascular;  Laterality: N/A;  . TEE WITHOUT CARDIOVERSION N/A 07/15/2017   Procedure: TRANSESOPHAGEAL ECHOCARDIOGRAM (TEE);  Surgeon: Melrose Nakayama, MD;  Location: [facility];  Service: Open Heart Surgery;  Laterality: N/A;  . TUBAL LIGATION    . TYMPANOPLASTY Left    fluid from ear drum     No outpatient medications have been marked as taking for the 02/27/19 encounter (Appointment) with Arnoldo Lenis, MD.     Allergies:   Aloe vera, Sulfa antibiotics, and Sulfasalazine   Social History   Tobacco Use  . Smoking status: Never Smoker  . Smokeless tobacco: Former Systems developer    Types: Snuff  Substance Use Topics  . Alcohol use: Yes    Alcohol/week: 1.0 standard drinks    Types: 1 Standard drinks or equivalent per week    Comment: occasionally  . Drug use: No     Family Hx: The patient's family history includes Breast cancer in her cousin, cousin, cousin, and sister;  Breast cancer (age of onset: 26) in her niece; Colon cancer (age of onset: 34) in her maternal uncle; Colon cancer (age of onset: 62) in her maternal uncle; Diabetes in her maternal grandfather, sister, and sister; Heart attack in her father and paternal grandmother; Heart disease (age of onset: 63) in her sister; Heart disease (age of onset: 33) in her brother; Hypertension in her daughter and daughter; Stroke in her maternal grandmother and mother.  ROS:   Please see the history of present illness.     All other systems reviewed and are negative.   Prior CV studies:   The following studies were reviewed today:  Jan 2019 cath  Mid LM lesion is 25% stenosed.  Dist LM to Ost LAD lesion is 80% stenosed.  Ost Cx to Prox Cx lesion is 30% stenosed.  Ost 1st Mrg lesion is 50% stenosed.  Prox LAD lesion is 90% stenosed.  The left ventricular systolic function is normal.  LV end  diastolic pressure is normal.  The left ventricular ejection fraction is 55-65% by visual estimate.  1. Single vessel obstructive CAD. Patient has complex ostial and proximal to mid LAD disease that involves the origin of the ramus intermediate and first diagonal respectively. 2. Normal LV function 3. Normal LVEDP  Plan: given complexity of lesions with ostial and bifurcation LAD disease I would recommend consideration for CABG.  Jan 2019 Carotid US Final Interpretation: Right Carotid: Velocities in the right ICA are consistent with a 1-39% stenosis.  Left Carotid: Velocities in the left ICA are consistent with a 1-39% stenosis. Vertebrals: Both vertebral arteries were patent with antegrade flow. Subclavians:  Jan 2019 echo Study Conclusions  - Left ventricle: The cavity size was normal. Wall thickness was increased in a pattern of mild LVH. Systolic function was normal. The estimated ejection fraction was in the range of 55% to 60%. Wall motion was normal; there were no regional wall  motion abnormalities. Doppler parameters are consistent with abnormal left ventricular relaxation (grade 1 diastolic dysfunction). - Mitral valve: Valve area by pressure half-time: 1.26 cm^2.  Impressions:  - Normal LV systolic function; mild LVH; mild diastolic dysfunction.  10/2017 cath  Ost 1st Diag to 1st Diag lesion is 40% stenosed.  Ost Cx lesion is 25% stenosed.  Ost Ramus lesion is 20% stenosed.  Ost LAD lesion is 45% stenosed.  Prox LAD to Mid LAD lesion is 80% stenosed.  Origin lesion is 100% stenosed.  Origin lesion is 99% stenosed.  Origin to Dist Graft lesion is 99% stenosed.  Dist Graft lesion is 100% stenosed.  LV end diastolic pressure is mildly elevated.  The left ventricular systolic function is normal.  Hyperdynamic LV function with an EF of 65% without focal segmental wall motion abnormalities.  Native coronary obstructive disease with 40% ostial LAD stenosis, diffuse 40 to 50% proximal diagonal stenosis with 80% LAD stenosis diffusely after the diagonal takeoff and evidence for competitive filling to the mid LAD via the LIMA graft; 25% ostial smooth narrowing in the ramus intermediate vessel; 25% ostial smooth narrowing in the left circumflex vessel; normal RCA.  Widely patent LIMA graft which supplies the mid LAD.  Totally ostial occlusion of the SVG which had supplied the ramus intermediate vessel.  Subtotally occluded ostial vein graft with diffuse 99% narrowing insistent with an atretic graft which does not fill the diagonal vessel.  POST CATHRECOMMENDATION: Medical therapy. I suspect the early vein graft occlusion was contributed by improvement in the prior pre-CABG stenoses.    Labs/Other Tests and Data Reviewed:    EKG:  No ECG reviewed.  Recent Labs: 02/22/2019: ALT 13; BUN 10; Creatinine, Ser 0.82; Hemoglobin 12.0; Platelets 187; Potassium 3.4; Sodium 144   Recent Lipid Panel Lab Results  Component Value Date/Time    CHOL 79 11/18/2017 09:35 AM   TRIG 52 11/18/2017 09:35 AM   HDL 25 (L) 11/18/2017 09:35 AM   CHOLHDL 3.2 11/18/2017 09:35 AM   LDLCALC 44 11/18/2017 09:35 AM    Wt Readings from Last 3 Encounters:  02/22/19 206 lb 9.6 oz (93.7 kg)  02/15/19 205 lb (93 kg)  01/24/19 209 lb 8 oz (95 kg)     Objective:    Vital Signs:   Today's Vitals   02/27/19 1239  BP: 137/79  Weight: 189 lb (85.7 kg)  Height: 5\' 6"  (1.676 m)   Body mass index is 30.51 kg/m.  NOrmal affect. Normal speech pattern and tone. Comfortable, no apparent distress. No audible  signs of SOb or wheezing.   ASSESSMENT & PLAN:    1. CAD -recent atypical chest pain, continue to monitor at this time - has been over 1 year on DAPT, she will stop plavix.   2. Hyperlipidemia -has been at goal, continue statin. Requets pcp labs   3. HTN -somewhat up and down, overall trends look to be reasonable - continue current meds    COVID-19 Education: The signs and symptoms of COVID-19 were discussed with the patient and how to seek care for testing (follow up with PCP or arrange E-visit).  The importance of social distancing was discussed today.  Time:   Today, I have spent 17 minutes with the patient with telehealth technology discussing the above problems.     Medication Adjustments/Labs and Tests Ordered: Current medicines are reviewed at length with the patient today.  Concerns regarding medicines are outlined above.   Tests Ordered: No orders of the defined types were placed in this encounter.   Medication Changes: No orders of the defined types were placed in this encounter.   Follow Up:  In Person in 6 month(s)  Signed, Carlyle Dolly, MD  02/27/2019 10:35 AM    [place]

## 2019-02-27 NOTE — Patient Instructions (Signed)
Medication Instructions:  STOP PLAVIX  Labwork: I WILL REQUEST LABS FROM PCP   Testing/Procedures: NONE  Follow-Up: Your physician wants you to follow-up in: 6 MONTHS . You will receive a reminder letter in the mail two months in advance. If you don't receive a letter, please call our office to schedule the follow-up appointment.   Any Other Special Instructions Will Be Listed Below (If Applicable).     If you need a refill on your cardiac medications before your next appointment, please call your pharmacy.

## 2019-02-27 NOTE — Addendum Note (Signed)
Addended by: Debbora Lacrosse R on: 02/27/2019 01:34 PM   Modules accepted: Orders

## 2019-02-28 NOTE — Progress Notes (Signed)
  Radiation Oncology         [phone]) [phone] ________________________________  Name: Gloria Mccoy MRN: [id number]  Date: 01/19/2019  DOB: 1945/11/22  End of Treatment Note  Diagnosis:   left-sided breast cancer     Indication for treatment:  Curative       Radiation treatment dates:   12/05/2018 through 01/19/2019  Site/dose:   The patient initially received a dose of 50.4 Gy in 28 fractions to the breast using whole-breast tangent fields. This was delivered using a 3-D conformal technique. The patient then received a boost to the seroma. This delivered an additional 10 Gy in 5 fractions using a 3 field photon boost technique. The total dose was 60.4 Gy.  Narrative: The patient tolerated radiation treatment relatively well.   The patient had some expected skin irritation as she progressed during treatment. Moist desquamation was not present at the end of treatment.  Plan: The patient has completed radiation treatment. The patient will return to radiation oncology clinic for routine followup in one month. I advised the patient to call or return sooner if they have any questions or concerns related to their recovery or treatment. ________________________________  Jodelle Gross, M.D., Ph.D.

## 2019-03-08 ENCOUNTER — Other Ambulatory Visit: Payer: Medicare HMO

## 2019-03-08 ENCOUNTER — Ambulatory Visit: Payer: Medicare HMO | Admitting: Adult Health

## 2019-03-08 ENCOUNTER — Ambulatory Visit: Payer: Medicare HMO

## 2019-03-12 DIAGNOSIS — E039 Hypothyroidism, unspecified: Secondary | ICD-10-CM | POA: Diagnosis not present

## 2019-03-12 DIAGNOSIS — R002 Palpitations: Secondary | ICD-10-CM | POA: Diagnosis not present

## 2019-03-12 DIAGNOSIS — Z6835 Body mass index (BMI) 35.0-35.9, adult: Secondary | ICD-10-CM | POA: Diagnosis not present

## 2019-03-12 DIAGNOSIS — E6609 Other obesity due to excess calories: Secondary | ICD-10-CM | POA: Diagnosis not present

## 2019-03-12 DIAGNOSIS — I10 Essential (primary) hypertension: Secondary | ICD-10-CM | POA: Diagnosis not present

## 2019-03-12 DIAGNOSIS — E063 Autoimmune thyroiditis: Secondary | ICD-10-CM | POA: Diagnosis not present

## 2019-03-15 ENCOUNTER — Other Ambulatory Visit: Payer: Self-pay

## 2019-03-15 ENCOUNTER — Inpatient Hospital Stay: Payer: Medicare HMO

## 2019-03-15 ENCOUNTER — Inpatient Hospital Stay (HOSPITAL_BASED_OUTPATIENT_CLINIC_OR_DEPARTMENT_OTHER): Payer: Medicare HMO | Admitting: Adult Health

## 2019-03-15 ENCOUNTER — Encounter: Payer: Self-pay | Admitting: Oncology

## 2019-03-15 VITALS — BP 163/64 | HR 67 | Temp 98.5°F | Resp 18 | Ht 66.0 in | Wt 204.0 lb

## 2019-03-15 DIAGNOSIS — H109 Unspecified conjunctivitis: Secondary | ICD-10-CM | POA: Diagnosis not present

## 2019-03-15 DIAGNOSIS — C50512 Malignant neoplasm of lower-outer quadrant of left female breast: Secondary | ICD-10-CM | POA: Diagnosis not present

## 2019-03-15 DIAGNOSIS — I252 Old myocardial infarction: Secondary | ICD-10-CM | POA: Diagnosis not present

## 2019-03-15 DIAGNOSIS — Z171 Estrogen receptor negative status [ER-]: Secondary | ICD-10-CM

## 2019-03-15 DIAGNOSIS — C50312 Malignant neoplasm of lower-inner quadrant of left female breast: Secondary | ICD-10-CM | POA: Diagnosis not present

## 2019-03-15 DIAGNOSIS — I10 Essential (primary) hypertension: Secondary | ICD-10-CM | POA: Diagnosis not present

## 2019-03-15 DIAGNOSIS — Z95828 Presence of other vascular implants and grafts: Secondary | ICD-10-CM

## 2019-03-15 DIAGNOSIS — Z5111 Encounter for antineoplastic chemotherapy: Secondary | ICD-10-CM | POA: Diagnosis not present

## 2019-03-15 DIAGNOSIS — I2510 Atherosclerotic heart disease of native coronary artery without angina pectoris: Secondary | ICD-10-CM | POA: Diagnosis not present

## 2019-03-15 DIAGNOSIS — R5383 Other fatigue: Secondary | ICD-10-CM | POA: Diagnosis not present

## 2019-03-15 DIAGNOSIS — Z7289 Other problems related to lifestyle: Secondary | ICD-10-CM | POA: Diagnosis not present

## 2019-03-15 LAB — COMPREHENSIVE METABOLIC PANEL
ALT: 12 U/L (ref 0–44)
AST: 20 U/L (ref 15–41)
Albumin: 3.5 g/dL (ref 3.5–5.0)
Alkaline Phosphatase: 81 U/L (ref 38–126)
Anion gap: 5 (ref 5–15)
BUN: 10 mg/dL (ref 8–23)
CO2: 28 mmol/L (ref 22–32)
Calcium: 8.8 mg/dL — ABNORMAL LOW (ref 8.9–10.3)
Chloride: 109 mmol/L (ref 98–111)
Creatinine, Ser: 0.77 mg/dL (ref 0.44–1.00)
GFR calc Af Amer: >60 mL/min (ref >60)
GFR calc non Af Amer: >60 mL/min (ref >60)
Glucose, Bld: 99 mg/dL (ref 70–99)
Potassium: 3.4 mmol/L — ABNORMAL LOW (ref 3.5–5.1)
Sodium: 142 mmol/L (ref 135–145)
Total Bilirubin: 0.6 mg/dL (ref 0.3–1.2)
Total Protein: 6.7 g/dL (ref 6.5–8.1)

## 2019-03-15 LAB — CBC WITH DIFFERENTIAL/PLATELET
Abs Immature Granulocytes: 0.1 10*3/uL — ABNORMAL HIGH (ref 0.00–0.07)
Basophils Absolute: 0.1 10*3/uL (ref 0.0–0.1)
Basophils Relative: 1 %
Eosinophils Absolute: 0.3 10*3/uL (ref 0.0–0.5)
Eosinophils Relative: 4 %
HCT: 34.6 % — ABNORMAL LOW (ref 36.0–46.0)
Hemoglobin: 12.1 g/dL (ref 12.0–15.0)
Immature Granulocytes: 1 %
Lymphocytes Relative: 23 %
Lymphs Abs: 1.8 10*3/uL (ref 0.7–4.0)
MCH: 29.4 pg (ref 26.0–34.0)
MCHC: 35 g/dL (ref 30.0–36.0)
MCV: 84.2 fL (ref 80.0–100.0)
Monocytes Absolute: 1.9 10*3/uL — ABNORMAL HIGH (ref 0.1–1.0)
Monocytes Relative: 24 %
Neutro Abs: 3.7 10*3/uL (ref 1.7–7.7)
Neutrophils Relative %: 47 %
Platelets: 228 10*3/uL (ref 150–400)
RBC: 4.11 MIL/uL (ref 3.87–5.11)
RDW: 15.8 % — ABNORMAL HIGH (ref 11.5–15.5)
WBC: 7.8 10*3/uL (ref 4.0–10.5)
nRBC: 0 % (ref 0.0–0.2)

## 2019-03-15 MED ORDER — CETAPHIL MOISTURIZING EX LOTN: 1.0000 "application " | TOPICAL_LOTION | CUTANEOUS | 0 refills | Status: DC | PRN

## 2019-03-15 MED ORDER — AQUAPHOR EX OINT: TOPICAL_OINTMENT | CUTANEOUS | 0 refills | Status: DC | PRN

## 2019-03-15 MED ORDER — METHOTREXATE SODIUM (PF) CHEMO INJECTION 250 MG/10ML
39.8000 mg/m2 | Freq: Once | INTRAMUSCULAR | Status: AC
  Administered 2019-03-15: 80 mg via INTRAVENOUS
  Filled 2019-03-15: qty 3.2

## 2019-03-15 MED ORDER — SODIUM CHLORIDE 0.9% FLUSH
10.0000 mL | INTRAVENOUS | Status: DC | PRN
  Administered 2019-03-15: 10 mL
  Filled 2019-03-15: qty 10

## 2019-03-15 MED ORDER — SODIUM CHLORIDE 0.9 % IV SOLN
Freq: Once | INTRAVENOUS | Status: AC
  Administered 2019-03-15: 14:00:00 via INTRAVENOUS
  Filled 2019-03-15: qty 250

## 2019-03-15 MED ORDER — FLUOROURACIL CHEMO INJECTION 2.5 GM/50ML
600.0000 mg/m2 | Freq: Once | INTRAVENOUS | Status: AC
  Administered 2019-03-15: 1200 mg via INTRAVENOUS
  Filled 2019-03-15: qty 24

## 2019-03-15 MED ORDER — SODIUM CHLORIDE 0.9% FLUSH
10.0000 mL | Freq: Once | INTRAVENOUS | Status: AC
  Administered 2019-03-15: 13:00:00 10 mL
  Filled 2019-03-15: qty 10

## 2019-03-15 MED ORDER — SODIUM CHLORIDE 0.9 % IV SOLN
600.0000 mg/m2 | Freq: Once | INTRAVENOUS | Status: AC
  Administered 2019-03-15: 15:00:00 1200 mg via INTRAVENOUS
  Filled 2019-03-15: qty 60

## 2019-03-15 MED ORDER — PALONOSETRON HCL INJECTION 0.25 MG/5ML
0.2500 mg | Freq: Once | INTRAVENOUS | Status: AC
  Administered 2019-03-15: 0.25 mg via INTRAVENOUS

## 2019-03-15 MED ORDER — DEXAMETHASONE SODIUM PHOSPHATE 10 MG/ML IJ SOLN
10.0000 mg | Freq: Once | INTRAMUSCULAR | Status: AC
  Administered 2019-03-15: 10 mg via INTRAVENOUS

## 2019-03-15 MED ORDER — DEXAMETHASONE SODIUM PHOSPHATE 10 MG/ML IJ SOLN
INTRAMUSCULAR | Status: AC
  Filled 2019-03-15: qty 1

## 2019-03-15 MED ORDER — VITAMIN E 45 MG (100 UNIT) PO CAPS: 100.0000 [IU] | ORAL_CAPSULE | Freq: Every day | ORAL | 0 refills | Status: AC

## 2019-03-15 MED ORDER — PALONOSETRON HCL INJECTION 0.25 MG/5ML
INTRAVENOUS | Status: AC
  Filled 2019-03-15: qty 5

## 2019-03-15 MED ORDER — HEPARIN SOD (PORK) LOCK FLUSH 100 UNIT/ML IV SOLN
500.0000 [IU] | Freq: Once | INTRAVENOUS | Status: AC | PRN
  Administered 2019-03-15: 500 [IU]
  Filled 2019-03-15: qty 5

## 2019-03-15 NOTE — Patient Instructions (Signed)
[facility] Discharge Instructions for Patients Receiving Chemotherapy  Today you received the following chemotherapy agents: Cytoxan, methotrexate, 5FU  To help prevent nausea and vomiting after your treatment, we encourage you to take your nausea medication as directed.    If you develop nausea and vomiting that is not controlled by your nausea medication, call the clinic.   BELOW ARE SYMPTOMS THAT SHOULD BE REPORTED IMMEDIATELY:  *FEVER GREATER THAN 100.5 F  *CHILLS WITH OR WITHOUT FEVER  NAUSEA AND VOMITING THAT IS NOT CONTROLLED WITH YOUR NAUSEA MEDICATION  *UNUSUAL SHORTNESS OF BREATH  *UNUSUAL BRUISING OR BLEEDING  TENDERNESS IN MOUTH AND THROAT WITH OR WITHOUT PRESENCE OF ULCERS  *URINARY PROBLEMS  *BOWEL PROBLEMS  UNUSUAL RASH Items with * indicate a potential emergency and should be followed up as soon as possible.  Feel free to call the clinic should you have any questions or concerns. The clinic phone number is [phone].  Please show the CHEMO ALERT CARD at check-in to the Emergency Department and triage nurse.   

## 2019-03-15 NOTE — Progress Notes (Signed)
[facility]  [phone] Fax:([phone]    ID: Gloria Mccoy DOB: 04-05-1946  MR#: [id number]  XFG#:[id number]  Patient Care Team: Redmond School, MD as PCP - General (Internal Medicine) Harl Bowie, Alphonse Guild, MD as PCP - Cardiology (Cardiology) Magrinat, Virgie Dad, MD as Consulting Physician (Oncology) Kyung Rudd, MD as Consulting Physician (Radiation Oncology) Rutherford Guys, MD as Consulting Physician (Ophthalmology) Jovita Kussmaul, MD as Consulting Physician (General Surgery) Mauro Kaufmann, RN as Oncology Nurse Navigator Rockwell Germany, RN as Oncology Nurse Navigator OTHER MD:    CHIEF COMPLAINT: Triple negative breast cancer  CURRENT TREATMENT: Adjuvant chemotherapy, adjuvant radiation   INTERVAL HISTORY: Gloria Mccoy was seen today for follow-up and treatment of her triple negative breast cancer.   Gloria Mccoy completed radiation on 7/31 and is healing well from this.  She continues on adjuvant chemotherapy consisting of cyclophosphamide, methotrexate, and fluorouracil (CMF), repeated every 21 days x 8. She is currently cycle 7 day 1 of her therapy.  She is doing well today.    REVIEW OF SYSTEMS: Gloria Mccoy is feeling well.  Her skin continues to heal and she wants to know if I can recommend and prescribe some OTC lotion for her so that her health card will pay for it.  She also wants her vitamin e supplementation refilled.   Gloria Mccoy is mildly fatigued, but is otherwise doing well and has no issues.  She has no cough, shortness of breath, chest pain, or palpitations.  She is without fevers, chills, headaches, nausea, vomiting, bowel/bladder changes.  She denies any skin rashes, lesions, or mucositis.  A detailed ROS was otherwise non contributory today.    HISTORY OF CURRENT ILLNESS: From the original intake note:  Gloria Mccoy had routine screening mammography on 07/12/2018 showing a possible abnormality in the left breast. She underwent  unilateral left diagnostic mammography with tomography and left breast ultrasonography at [facility] on 07/25/2018 showing: Breast Density Category B. There is a mass with indistinct margins in the lower inner posterior left breast measuring approximately 0.6 cm. Targeted ultrasound of the lower left breast was performed. There is a hypoechoic mass with margin irregularity in the left breast at 7 o'clock, 7 cm from the nipple measuring 0.7 x 0.5 x 0.6 cm. No lymphadenopathy seen in the left axilla.   Accordingly on 08/01/2018 she proceeded to biopsy of the left breast area in question. The pathology from this procedure showed ([id number]): invasive ductal carcinoma, grade II. Prognostic indicators significant for: estrogen receptor, 0% negative and progesterone receptor, 0% negative. Proliferation marker Ki67 at 40%. HER2 negative (1+) by immunohistochemistry.   The patient's subsequent history is as detailed below.   PAST MEDICAL HISTORY: Past Medical History:  Diagnosis Date  . Arthritis    "hands sometimes" (07/13/2017)  . Coronary artery disease    a. s/p CABG x3 in 06/2017 with LIMA-LAD, SVG-D1, and SVG-RI.  b. 10/2017: cath showing a widely patent LIMA-LAD with ostial occlusion of the SVG-RI and subtotally occluded atretic SVG-D1. Graft occlusion thought to be 2ry to improvement in pre-CABG stenoses.   . Essential hypertension   . Family history of adverse reaction to anesthesia    sister had a complication that was stated she had a "foggy" episode after her breast surgery was readmitted 1 day post op after being discharged from the hospital. Sister also has significant lung problems that could have contributed to this   . Family history of breast cancer   . Family history  of colon cancer   . Hypothyroid   . Meniere disease   . Myocardial infarction ([place]) 06/2017   during cardaic rehab  . Obesity   . Osteopenia 01/2012   T score -1.3 FRAX 7.9%/0.6%     PAST SURGICAL HISTORY:  Past Surgical History:  Procedure Laterality Date  . BREAST LUMPECTOMY WITH RADIOACTIVE SEED AND SENTINEL LYMPH NODE BIOPSY Left 09/01/2018   Procedure: LEFT BREAST LUMPECTOMY WITH RADIOACTIVE SEED AND SENTINEL LYMPH NODE BIOPSY;  Surgeon: Jovita Kussmaul, MD;  Location: [place] [facility];  Service: General;  Laterality: Left;  . BREAST SURGERY    . CARDIAC CATHETERIZATION  07/13/2017  . CATARACT EXTRACTION W/PHACO Right 01/28/2015   Procedure: CATARACT EXTRACTION PHACO AND INTRAOCULAR LENS PLACEMENT :  CDE:  5.70;  Surgeon: Rutherford Guys, MD;  Location: [facility];  Service: Ophthalmology;  Laterality: Right;  . CATARACT EXTRACTION W/PHACO Left 02/11/2015   Procedure: CATARACT EXTRACTION PHACO AND INTRAOCULAR LENS PLACEMENT (IOC);  Surgeon: Rutherford Guys, MD;  Location: [facility];  Service: Ophthalmology;  Laterality: Left;  CDE: 7.38  . COLONOSCOPY N/A 09/26/2012   Procedure: COLONOSCOPY;  Surgeon: Jamesetta So, MD;  [facility];  Service: Gastroenterology;  Laterality: N/A;  . CORONARY ARTERY BYPASS GRAFT N/A 07/15/2017   Procedure: CORONARY ARTERY BYPASS GRAFTING (CABG) X 3 USING LEFT INTERNAL MAMMARY ARTERY AND RIGHT SAPHENOUS VEIN- ENDOSCOPICALLY HARVESTED;  Surgeon: Melrose Nakayama, MD;  Location: [facility];  Service: Open Heart Surgery;  Laterality: N/A;  . IR IMAGING GUIDED PORT INSERTION  12/29/2018  . LEFT HEART CATH AND CORONARY ANGIOGRAPHY N/A 07/13/2017   Procedure: LEFT HEART CATH AND CORONARY ANGIOGRAPHY;  Surgeon: Martinique, Peter M, MD;  Location: [facility];  Service: Cardiovascular;  Laterality: N/A;  . LEFT HEART CATH AND CORS/GRAFTS ANGIOGRAPHY N/A 11/03/2017   Procedure: LEFT HEART CATH AND CORS/GRAFTS ANGIOGRAPHY;  Surgeon: Troy Sine, MD;  Location: [facility];  Service: Cardiovascular;  Laterality: N/A;  . TEE WITHOUT CARDIOVERSION N/A 07/15/2017   Procedure: TRANSESOPHAGEAL ECHOCARDIOGRAM (TEE);  Surgeon: Melrose Nakayama, MD;  Location: [facility];  Service: Open  Heart Surgery;  Laterality: N/A;  . TUBAL LIGATION    . TYMPANOPLASTY Left    fluid from ear drum     FAMILY HISTORY: Family History  Problem Relation Age of Onset  . Diabetes Sister        AODM  . Heart disease Sister 61       CABG  . Breast cancer Sister   . Heart disease Brother 54       In his 49s  . Colon cancer Maternal Uncle 60  . Diabetes Sister        AODM  . Hypertension Daughter   . Hypertension Daughter   . Breast cancer Cousin        PATERNAL COUSIN  . Heart attack Father        In his 43s  . Stroke Mother   . Stroke Maternal Grandmother   . Diabetes Maternal Grandfather        d. 109  . Heart attack Paternal Grandmother   . Colon cancer Maternal Uncle 7  . Breast cancer Cousin        dx in her 62s; mat first cousin  . Breast cancer Cousin        dx 32s; d. 30s  . Breast cancer Niece 39   Parilee's father died from a myocardial infarction at age 15. Patients' mother  died from a stroke at age 59. The patient has 3 brothers and 9 sisters. One of Yulia's sisters, [name], was diagnosed with breast cancer at the age of 11. Lidwina also has a niece that was diagnosed with breast cancer at 68, and two cousins that were diagnosed with breast cancer.  She believes 1 of her brothers may have prostate cancer.  Patient denies anyone in her family having ovarian or pancreatic cancer. Jaeley has an uncle that was diagnosed with colon cancer and an uncle that was diagnosed with "stomach" cancer.    GYNECOLOGIC HISTORY:  No LMP recorded. Patient is postmenopausal. 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 Her husband, Gloria Mccoy, is a retired Administrator. They have two daughters. Their daughter, [name], lives in [place] and is a housewife.  Their other daughter works at [employer]. Carlisia  had one grandchild that is now deceased. She attends a [employer].    ADVANCED DIRECTIVES: Gloria Mccoy's husband, Gloria Mccoy, is automatically her healthcare power of attorney.     HEALTH MAINTENANCE: Social History   Tobacco Use  . Smoking status: Never Smoker  . Smokeless tobacco: Former Systems developer    Types: Snuff  Substance Use Topics  . Alcohol use: Yes    Alcohol/week: 1.0 standard drinks    Types: 1 Standard drinks or equivalent per week    Comment: occasionally  . Drug use: No    Colonoscopy: yes, Dr. Arnoldo Morale  PAP:   Bone density: yes, 2016, -1.1 osteopenic   Allergies  Allergen Reactions  . Aloe Vera Itching    Per patient severe itching  . Sulfa Antibiotics Shortness Of Breath  . Sulfasalazine Shortness Of Breath    Current Outpatient Medications  Medication Sig Dispense Refill  . acetaminophen (TYLENOL) 325 MG tablet Take 2 tablets (650 mg total) by mouth every 6 (six) hours as needed for mild pain.    Marland Kitchen aspirin EC 81 MG tablet Take 81 mg by mouth daily.    . Cholecalciferol (VITAMIN D PO) Take 1 tablet by mouth daily.     . Cyanocobalamin (VITAMIN B-12 PO) Take 1 tablet by mouth daily.    Marland Kitchen dexamethasone (DECADRON) 4 MG tablet Take 1 tablet (4 mg total) by mouth daily. Start the day after chemotherapy for 2 days. Take with food. 30 tablet 1  . fish oil-omega-3 fatty acids 1000 MG capsule Take 1 g by mouth daily.    . furosemide (LASIX) 40 MG tablet Take 1 tablet (40 mg total) by mouth daily. 90 tablet 3  . HYDROcodone-acetaminophen (NORCO/VICODIN) 5-325 MG tablet Take 1-2 tablets by mouth every 6 (six) hours as needed for moderate pain or severe pain. 60 tablet 0  . levothyroxine (SYNTHROID) 25 MCG tablet     . levothyroxine (SYNTHROID, LEVOTHROID) 75 MCG tablet Take 75 mcg by mouth daily.    Marland Kitchen lidocaine-prilocaine (EMLA) cream Apply 1 application topically as needed. 30 g 0  . losartan (COZAAR) 25 MG tablet Take 0.5 tablets (12.5 mg total) by mouth daily. 45 tablet 3  .  meloxicam (MOBIC) 15 MG tablet Take 15 mg by mouth daily.    . metoprolol tartrate (LOPRESSOR) 25 MG tablet Take 1 tablet (25 mg total) by mouth 2 (two) times daily. 180 tablet 3  . prochlorperazine (COMPAZINE) 10 MG  tablet Take 1 tablet (10 mg total) by mouth 3 (three) times daily before meals. 30 tablet 1  . tobramycin-dexamethasone (TOBRADEX) ophthalmic solution Place 2 drops into the right eye every 4 (four) hours while awake. 5 mL 0  . VITAMIN E PO Take 1 tablet by mouth daily.      No current facility-administered medications for this visit.      OBJECTIVE:  Vitals:   03/15/19 1315  BP: (!) 163/64  Pulse: 67  Resp: 18  Temp: 98.5 F (36.9 C)  SpO2: 95%     Body mass index is 32.93 kg/m.   Wt Readings from Last 3 Encounters:  03/15/19 204 lb (92.5 kg)  02/27/19 189 lb (85.7 kg)  02/22/19 206 lb 9.6 oz (93.7 kg)  ECOG FS:1 - Symptomatic but completely ambulatory GENERAL: Patient is a well appearing female in no acute distress HEENT:  Sclerae anicteric.   Oropharynx clear and moist. No ulcerations or evidence of oropharyngeal candidiasis. Neck is supple.  NODES:  No cervical, supraclavicular, or axillary lymphadenopathy palpated.  BREAST EXAM:  deferred LUNGS:  Clear to auscultation bilaterally.  No wheezes or rhonchi. HEART:  Regular rate and rhythm. No murmur appreciated. ABDOMEN:  Soft, nontender.  Positive, normoactive bowel sounds. No organomegaly palpated. MSK:  No focal spinal tenderness to palpation. Full range of motion bilaterally in the upper extremities. EXTREMITIES:  Scant bilateral lower extremity edema SKIN:  Clear with no obvious rashes or skin changes. No nail dyscrasia. NEURO:  Nonfocal. Well oriented.  Appropriate affect.     LAB RESULTS:  CMP     Component Value Date/Time   NA 142 03/15/2019 1225   K 3.4 (L) 03/15/2019 1225   CL 109 03/15/2019 1225   CO2 28 03/15/2019 1225   GLUCOSE 99 03/15/2019 1225   BUN 10 03/15/2019 1225   CREATININE  0.77 03/15/2019 1225   CREATININE 0.78 01/16/2019 0840   CALCIUM 8.8 (L) 03/15/2019 1225   PROT 6.7 03/15/2019 1225   ALBUMIN 3.5 03/15/2019 1225   AST 20 03/15/2019 1225   AST 22 01/16/2019 0840   ALT 12 03/15/2019 1225   ALT 10 01/16/2019 0840   ALKPHOS 81 03/15/2019 1225   BILITOT 0.6 03/15/2019 1225   BILITOT 0.7 01/16/2019 0840   GFRNONAA >60 03/15/2019 1225   GFRNONAA >60 01/16/2019 0840   GFRAA >60 03/15/2019 1225   GFRAA >60 01/16/2019 0840    No results found for: TOTALPROTELP, ALBUMINELP, A1GS, A2GS, BETS, BETA2SER, GAMS, MSPIKE, SPEI  No results found for: KPAFRELGTCHN, LAMBDASER, KAPLAMBRATIO  Lab Results  Component Value Date   WBC 7.8 03/15/2019   NEUTROABS 3.7 03/15/2019   HGB 12.1 03/15/2019   HCT 34.6 (L) 03/15/2019   MCV 84.2 03/15/2019   PLT 228 03/15/2019    '@LASTCHEMISTRY' @  No results found for: LABCA2  No components found for: HTDSKA768  No results for input(s): INR in the last 168 hours.  No results found for: LABCA2  No results found for: TLX726  No results found for: OMB559  No results found for: RCB638  No results found for: CA2729  No components found for: HGQUANT  No results found for: CEA1 / No results found for: CEA1   No results found for: AFPTUMOR  No results found for: CHROMOGRNA  No results found for: PSA1  Appointment on 03/15/2019  Component Date Value Ref Range Status  . Sodium 03/15/2019 142  135 - 145 mmol/L Final  . Potassium 03/15/2019 3.4* 3.5 - 5.1 mmol/L  Final  . Chloride 03/15/2019 109  98 - 111 mmol/L Final  . CO2 03/15/2019 28  22 - 32 mmol/L Final  . Glucose, Bld 03/15/2019 99  70 - 99 mg/dL Final  . BUN 03/15/2019 10  8 - 23 mg/dL Final  . Creatinine, Ser 03/15/2019 0.77  0.44 - 1.00 mg/dL Final  . Calcium 03/15/2019 8.8* 8.9 - 10.3 mg/dL Final  . Total Protein 03/15/2019 6.7  6.5 - 8.1 g/dL Final  . Albumin 03/15/2019 3.5  3.5 - 5.0 g/dL Final  . AST 03/15/2019 20  15 - 41 U/L Final  . ALT  03/15/2019 12  0 - 44 U/L Final  . Alkaline Phosphatase 03/15/2019 81  38 - 126 U/L Final  . Total Bilirubin 03/15/2019 0.6  0.3 - 1.2 mg/dL Final  . GFR calc non Af Amer 03/15/2019 >60  >60 mL/min Final  . GFR calc Af Amer 03/15/2019 >60  >60 mL/min Final  . Anion gap 03/15/2019 5  5 - 15 Final   Performed at [facility], [place] [address] [place], [place] [address]  . WBC 03/15/2019 7.8  4.0 - 10.5 K/uL Final  . RBC 03/15/2019 4.11  3.87 - 5.11 MIL/uL Final  . Hemoglobin 03/15/2019 12.1  12.0 - 15.0 g/dL Final  . HCT 03/15/2019 34.6* 36.0 - 46.0 % Final  . MCV 03/15/2019 84.2  80.0 - 100.0 fL Final  . MCH 03/15/2019 29.4  26.0 - 34.0 pg Final  . MCHC 03/15/2019 35.0  30.0 - 36.0 g/dL Final  . RDW 03/15/2019 15.8* 11.5 - 15.5 % Final  . Platelets 03/15/2019 228  150 - 400 K/uL Final  . nRBC 03/15/2019 0.0  0.0 - 0.2 % Final  . Neutrophils Relative % 03/15/2019 47  % Final  . Neutro Abs 03/15/2019 3.7  1.7 - 7.7 K/uL Final  . Lymphocytes Relative 03/15/2019 23  % Final  . Lymphs Abs 03/15/2019 1.8  0.7 - 4.0 K/uL Final  . Monocytes Relative 03/15/2019 24  % Final  . Monocytes Absolute 03/15/2019 1.9* 0.1 - 1.0 K/uL Final  . Eosinophils Relative 03/15/2019 4  % Final  . Eosinophils Absolute 03/15/2019 0.3  0.0 - 0.5 K/uL Final  . Basophils Relative 03/15/2019 1  % Final  . Basophils Absolute 03/15/2019 0.1  0.0 - 0.1 K/uL Final  . Immature Granulocytes 03/15/2019 1  % Final  . Abs Immature Granulocytes 03/15/2019 0.10* 0.00 - 0.07 K/uL Final   Performed at [facility], [address] [place], [place] [address]    (this displays the last labs from the last 3 days)  No results found for: TOTALPROTELP, ALBUMINELP, A1GS, A2GS, BETS, BETA2SER, GAMS, MSPIKE, SPEI (this displays SPEP labs)  No results found for: KPAFRELGTCHN, LAMBDASER, KAPLAMBRATIO (kappa/lambda light chains)  No results found for: HGBA, HGBA2QUANT, HGBFQUANT,  HGBSQUAN (Hemoglobinopathy evaluation)   No results found for: LDH  No results found for: IRON, TIBC, IRONPCTSAT (Iron and TIBC)  No results found for: FERRITIN  Urinalysis    Component Value Date/Time   COLORURINE YELLOW 07/14/2017 2015   APPEARANCEUR HAZY (A) 07/14/2017 2015   LABSPEC 1.011 07/14/2017 2015   PHURINE 6.0 07/14/2017 2015   [place] 07/14/2017 2015   [place] 07/14/2017 2015   [place] 07/14/2017 2015   KETONESUR NEGATIVE 07/14/2017 2015   PROTEINUR NEGATIVE 07/14/2017 2015   UROBILINOGEN 0.2 07/03/2014 0852   NITRITE NEGATIVE 07/14/2017 2015   LEUKOCYTESUR LARGE (A) 07/14/2017 2015  STUDIES:  No results found.   ELIGIBLE FOR AVAILABLE RESEARCH PROTOCOL: no   ASSESSMENT: 73 y.o. [place], [place] woman status post left breast lower inner quadrant biopsy 08/01/2018 for a clinical T1c N0, stage IB invasive ductal carcinoma, grade 2, triple negative, with an MIB-1 of 40%  (1) genetics testing 08/29/2018 through with Hereditary Gene Panel offered by [facility] found no deleterious mutations in APC, ATM, AXIN2, BARD1, BMPR1A, BRCA1, BRCA2, BRIP1, CDH1, CDK4, CDKN2A (p14ARF), CDKN2A (p16INK4a), CHEK2, CTNNA1, DICER1, EPCAM (Deletion/duplication testing only), GREM1 (promoter region deletion/duplication testing only), KIT, MEN1, MLH1, MSH2, MSH3, MSH6, MUTYH, NBN, NF1, NHTL1, PALB2, PDGFRA, PMS2, POLD1, POLE, PTEN, RAD50, RAD51C, RAD51D, SDHB, SDHC, SDHD, SMAD4, SMARCA4. STK11, TP53, TSC1, TSC2, and VHL.  The following genes were evaluated for sequence changes only: SDHA and HOXB13 c.251G>A variant only.   (a) MLH1 c.1890T>G VUS identified  (2) status post left lumpectomy with sentinel lymph node sampling 09/01/2022 a pT1c pN0, stage IB invasive ductal carcinoma, grade 2, with negative margins.  (a) a total of 3 sentinel lymph nodes were removed  (3) chemotherapy consisting of cyclophosphamide, methotrexate, and fluorouracil (CMF), repeated  every 21 days x 8, starting 10/24/2018  (4) adjuvant radiation given concurrently with cycles 3-4-5 of chemotherapy from 12/05/2018-01/19/2019.  The left breast was treated to 50.4 Gy in 28 fractions, followed by a 10 Gy boost to the lumpectomy cavity.   PLAN: Zamara is doing well today.  Her labs are stable and she will continue on CMF today.  She is tolerating it well.   We reviewed her skin and the creams that I sent in.  I sent in Cetaphil and Aquaphor.  I also sent in her vitamin e.  She was appreciative.    Brian will return in 3 weeks for labs, f/u, and #8 and final cycle of CMF chemotherapy.  She was recommended to continue with the appropriate pandemic precautions.  She knows to call for any questions that may arise between now and her next appointment.  We are happy to see her sooner if needed.   A total of (20) minutes of face-to-face time was spent with this patient with greater than 50% of that time in counseling and care-coordination.   Wilber Bihari, NP  03/15/19 1:46 PM Medical Oncology and Hematology [facility] [address] [place], [place] [address] Tel. [phone]    Fax. [phone]

## 2019-03-19 ENCOUNTER — Encounter: Payer: Self-pay | Admitting: Adult Health

## 2019-03-23 IMAGING — MG 2D DIGITAL SCREENING BILATERAL MAMMOGRAM WITH 3D TOMO WITH CAD
8 series · 8 of 24 positions shown · non-contrast
Comparison: Previous exam(s).

CLINICAL DATA: Screening.

EXAM:
2D DIGITAL SCREENING BILATERAL MAMMOGRAM WITH 3D TOMO WITH CAD

[L MLO]
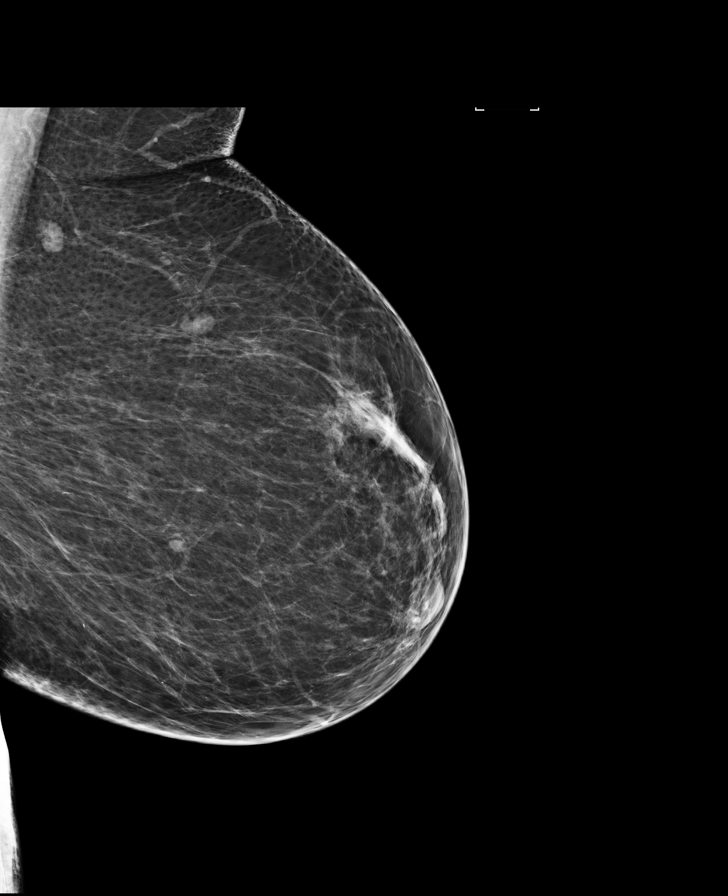

[R MLO]
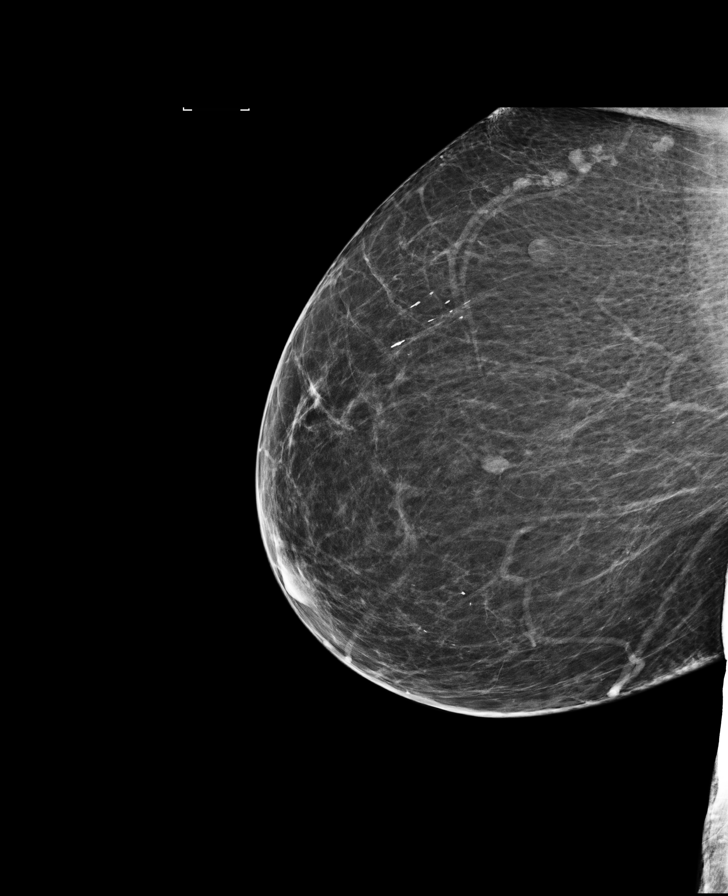

[L CC]
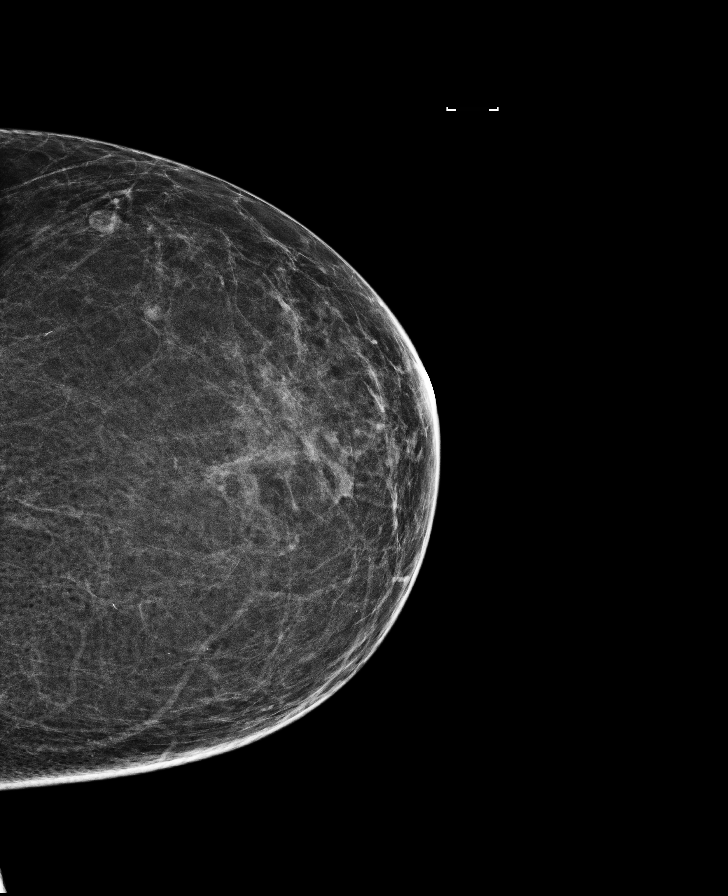

[R CC]
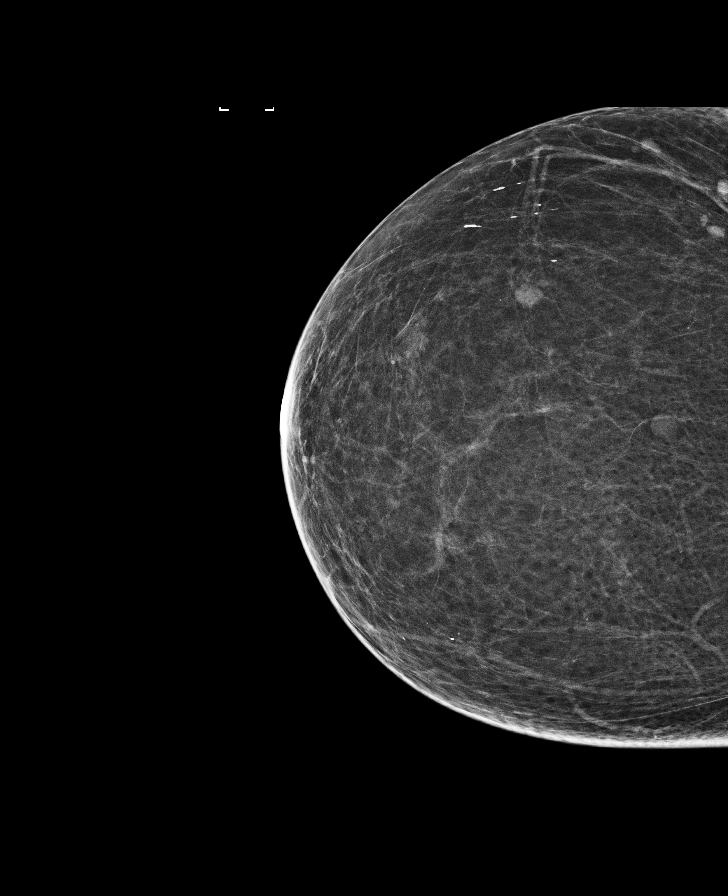

[R MLO tomo · tomo slice 35/69.0]
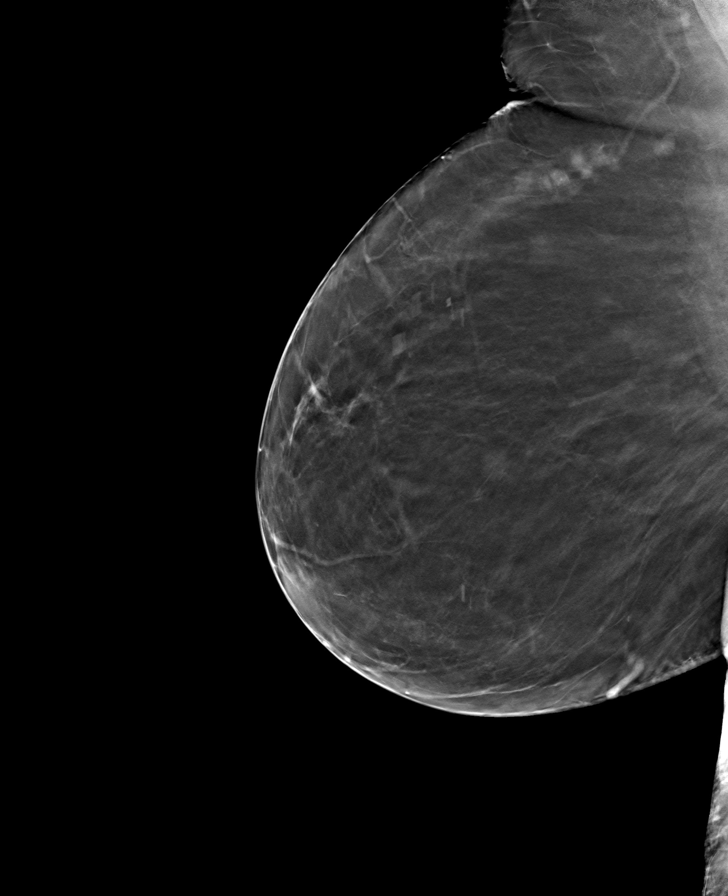

[R CC tomo · tomo slice 29/58.0]
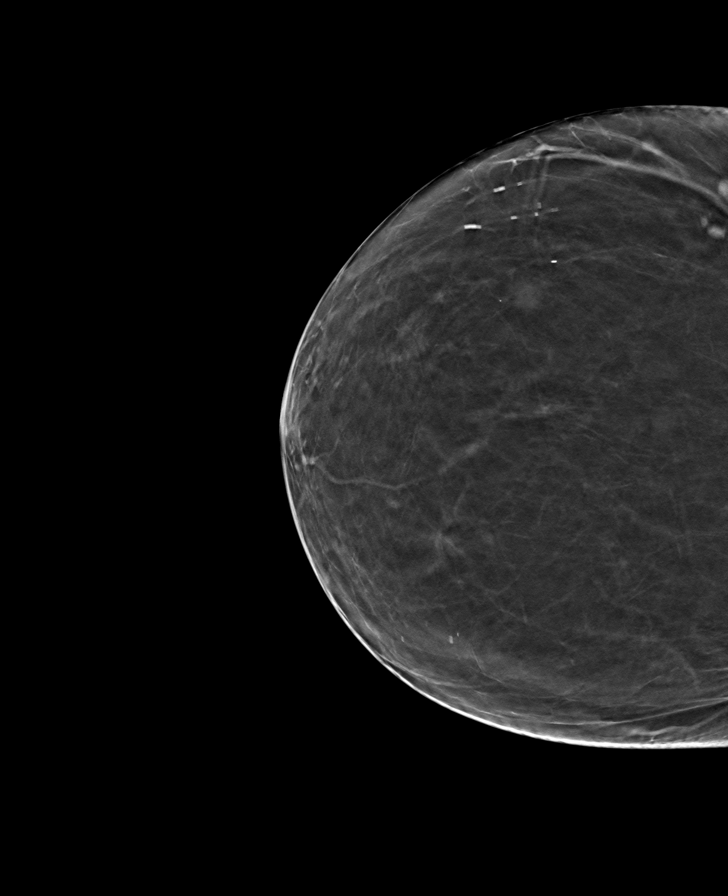

[L CC tomo · tomo slice 31/60.0]
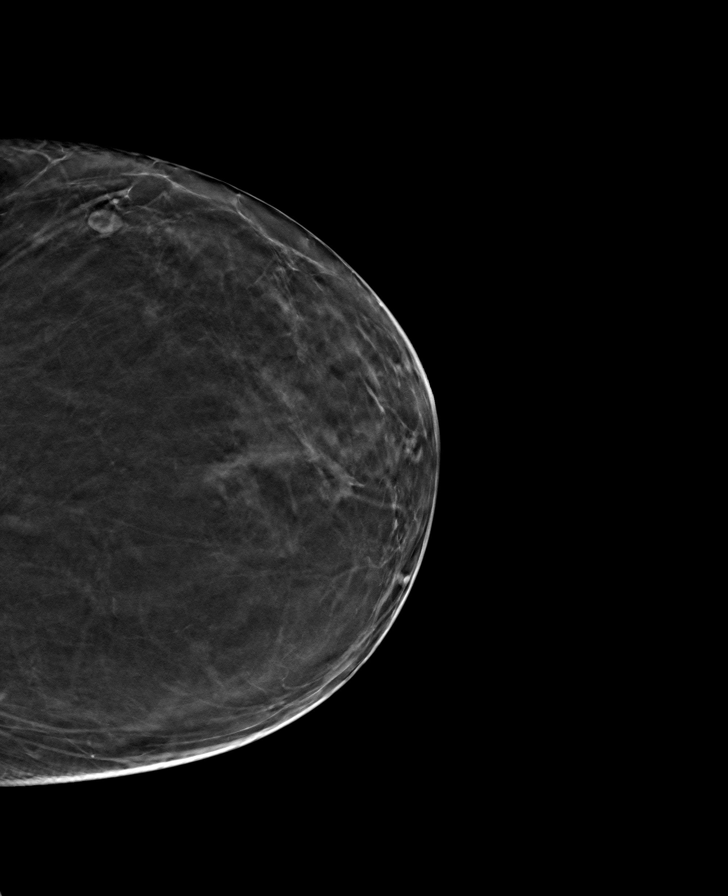

[L MLO tomo · tomo slice 37/72.0]
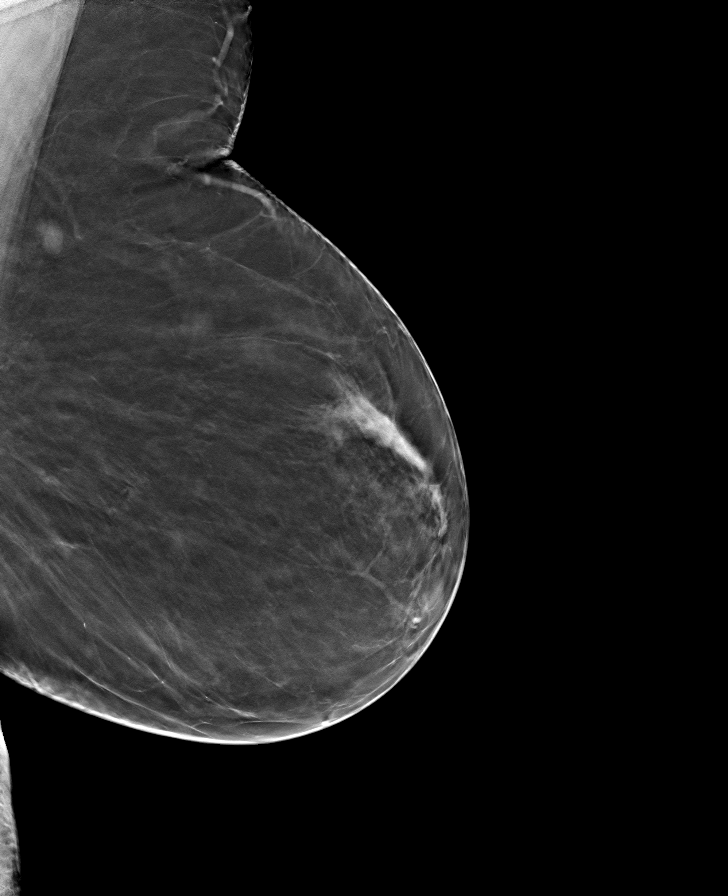

[8 of 24 positions shown; findings below may reference images not displayed]

ACR Breast Density Category b: There are scattered areas of
fibroglandular density.
FINDINGS: There are no findings suspicious for malignancy. Images were
processed with CAD.
IMPRESSION: No mammographic evidence of malignancy. A result letter of this
screening mammogram will be mailed directly to the patient.

RECOMMENDATION:
Screening mammogram in one year. (Code:[id number]-P-ZS0)

BI-RADS CATEGORY  1: Negative.

## 2019-03-29 DIAGNOSIS — R7309 Other abnormal glucose: Secondary | ICD-10-CM | POA: Diagnosis not present

## 2019-03-29 DIAGNOSIS — E7489 Other specified disorders of carbohydrate metabolism: Secondary | ICD-10-CM | POA: Diagnosis not present

## 2019-03-30 IMAGING — DX DG CHEST 2V
2 series · 2 of 2 positions shown · non-contrast
Comparison: 08/25/2015

CLINICAL DATA: Chest pain for several weeks with shortness of
Breath

EXAM:
CHEST  2 VIEW

[chest pa]
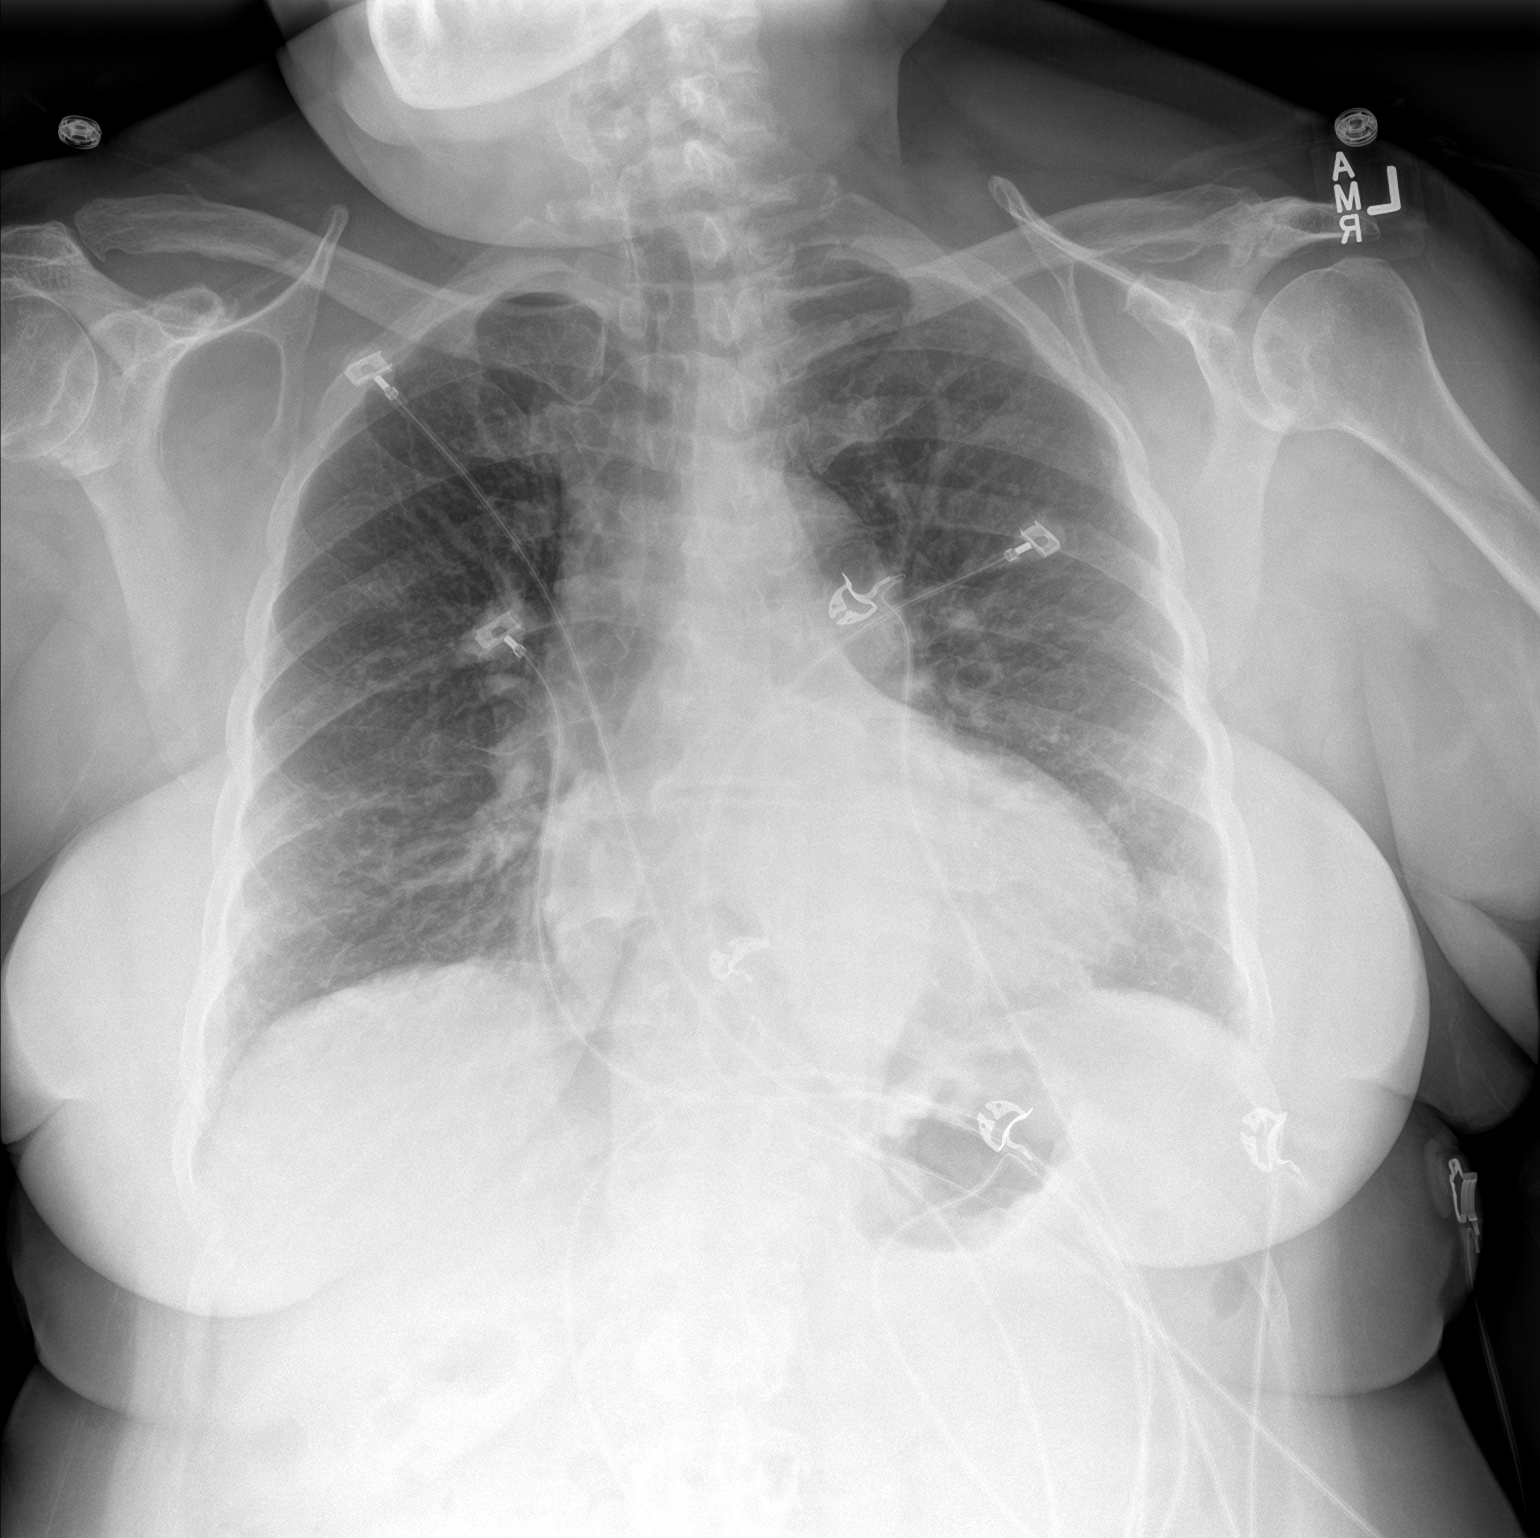

[chest lat]
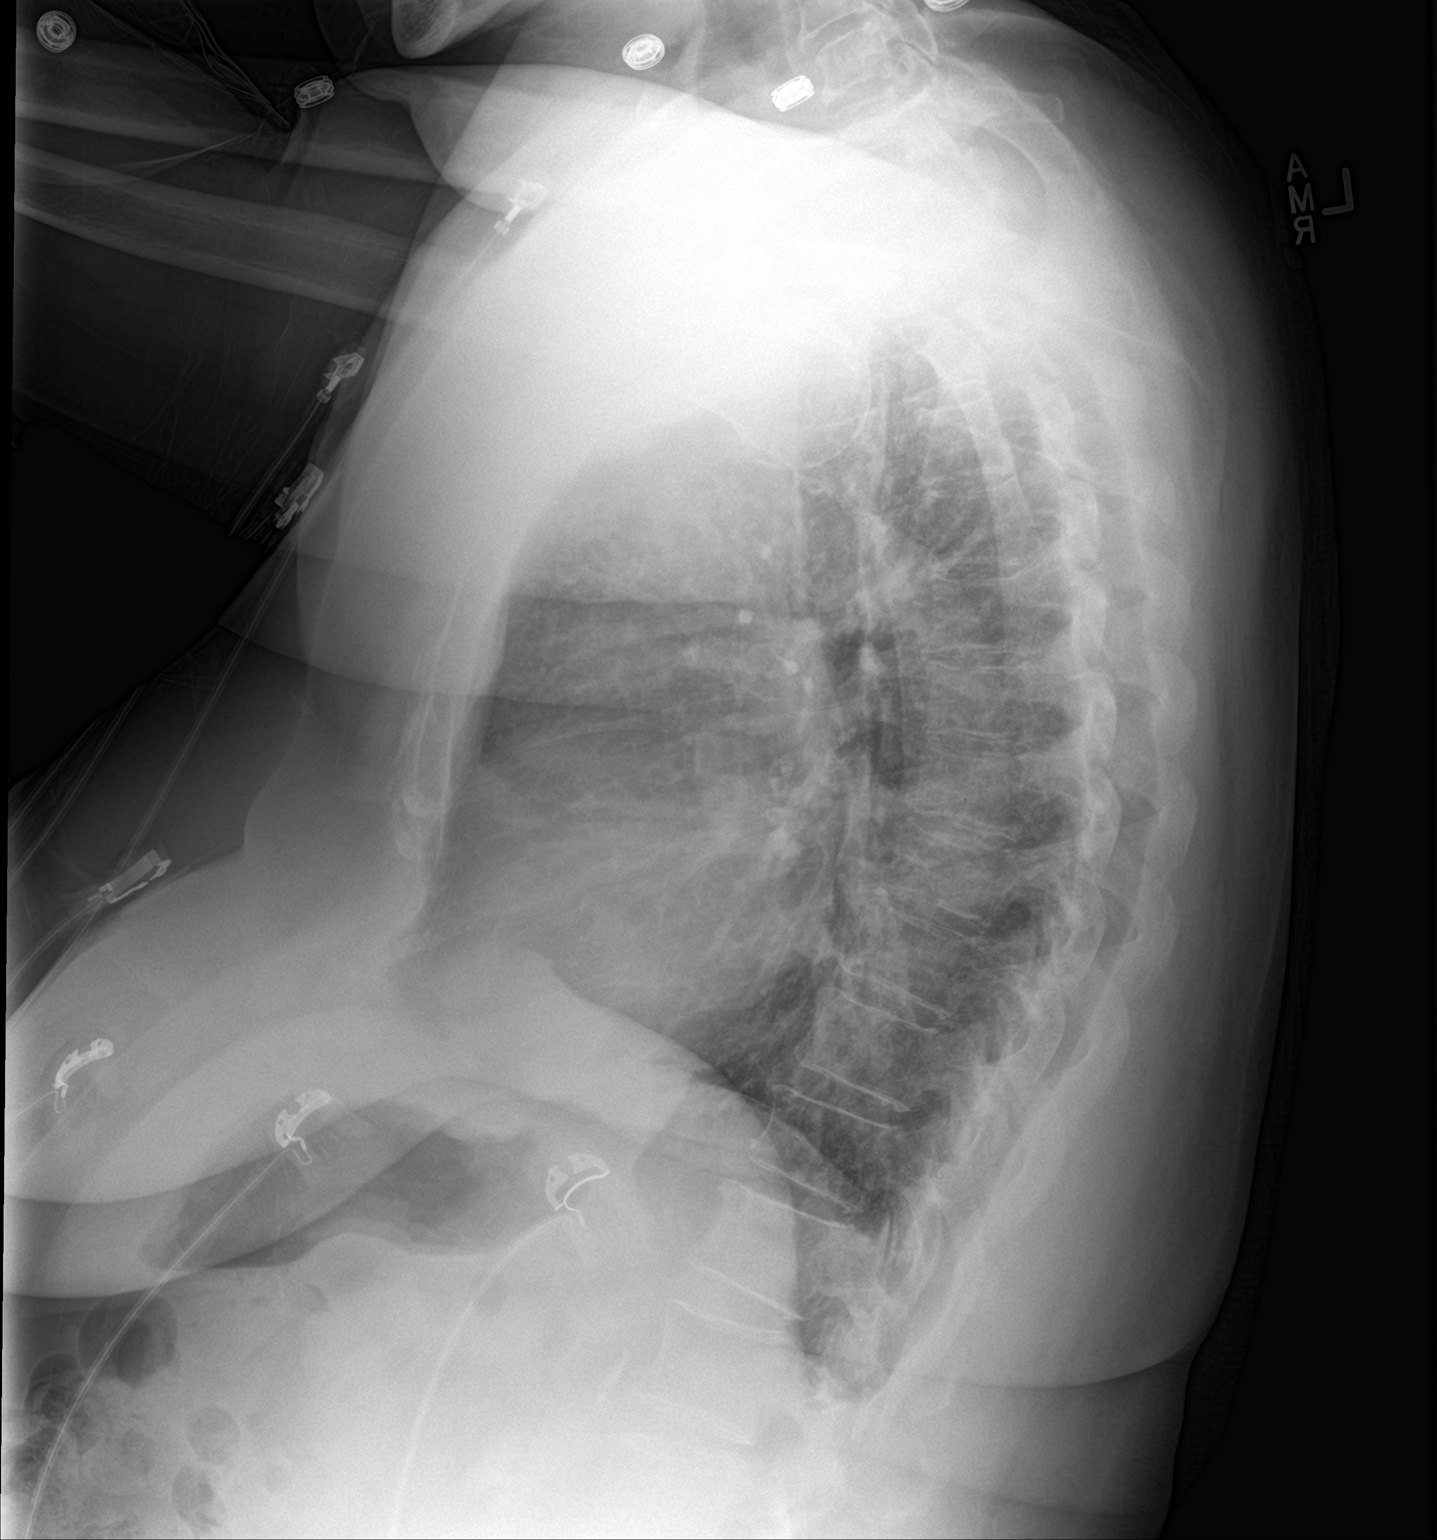

[2 of 2 positions shown; findings below may reference images not displayed]

FINDINGS: Cardiac shadow remains at the upper limits of normal in size. The
lungs are well aerated bilaterally. Mild atelectatic changes are
noted in the bases. No focal infiltrate or sizable effusion is seen.
No acute bony abnormality is noted.
IMPRESSION: Mild bibasilar atelectasis.

## 2019-04-01 IMAGING — DX DG CHEST 1V PORT
1 series · 1 of 1 positions shown · non-contrast
Comparison: PA and lateral chest 07/13/2017.

CLINICAL DATA: Status post CABG today.

EXAM:
PORTABLE CHEST 1 VIEW

[chest ap]
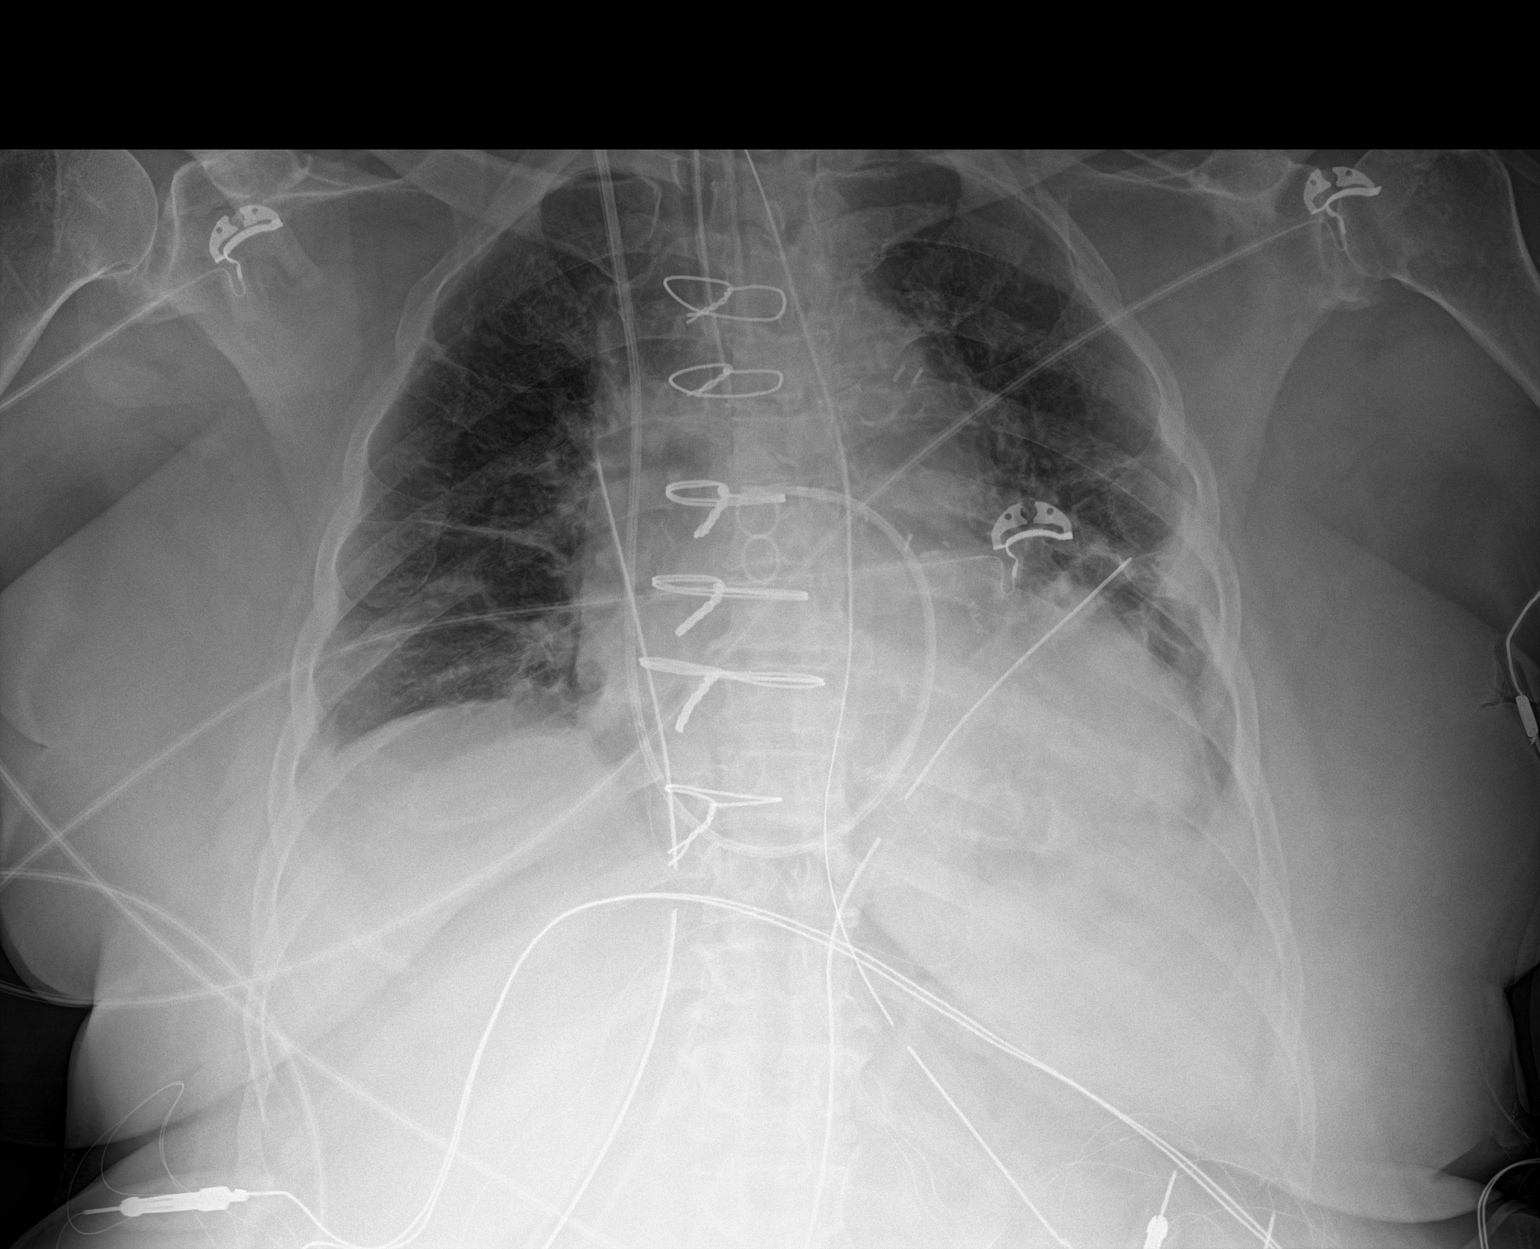

[1 of 1 positions shown; findings below may reference images not displayed]

FINDINGS: Endotracheal tube is in place with the tip 0.6 cm above the carina.
Recommend withdrawal of 2-2.5 cm. Right IJ approach Swan-Ganz
catheter tip is in the right main pulmonary artery. Side port of NG
tube is in the stomach. Mediastinal drain and left chest tube are
noted.

There is left basilar atelectasis and very small left effusion.
Minimal atelectasis on the right is also noted. Cardiomegaly is
present. No edema. Atherosclerosis noted.
IMPRESSION: ETT tip is 0.5 cm above the carina. The tube should be withdrawn
2-2.5 cm. Support tubes and lines are otherwise in good position. No
pneumothorax.

Bibasilar atelectasis is more notable on the left. Small left
effusion is seen.

## 2019-04-05 ENCOUNTER — Encounter: Payer: Self-pay | Admitting: Adult Health

## 2019-04-05 ENCOUNTER — Inpatient Hospital Stay: Payer: Medicare HMO

## 2019-04-05 ENCOUNTER — Other Ambulatory Visit: Payer: Self-pay

## 2019-04-05 ENCOUNTER — Encounter: Payer: Self-pay | Admitting: *Deleted

## 2019-04-05 ENCOUNTER — Inpatient Hospital Stay: Payer: Medicare HMO | Attending: Genetic Counselor

## 2019-04-05 ENCOUNTER — Encounter: Payer: Self-pay | Admitting: Oncology

## 2019-04-05 ENCOUNTER — Inpatient Hospital Stay (HOSPITAL_BASED_OUTPATIENT_CLINIC_OR_DEPARTMENT_OTHER): Payer: Medicare HMO | Admitting: Adult Health

## 2019-04-05 VITALS — BP 124/40 | HR 59 | Temp 98.2°F | Resp 18 | Ht 66.0 in | Wt 203.7 lb

## 2019-04-05 DIAGNOSIS — Z803 Family history of malignant neoplasm of breast: Secondary | ICD-10-CM | POA: Diagnosis not present

## 2019-04-05 DIAGNOSIS — Z171 Estrogen receptor negative status [ER-]: Secondary | ICD-10-CM | POA: Diagnosis not present

## 2019-04-05 DIAGNOSIS — R634 Abnormal weight loss: Secondary | ICD-10-CM | POA: Insufficient documentation

## 2019-04-05 DIAGNOSIS — I252 Old myocardial infarction: Secondary | ICD-10-CM | POA: Insufficient documentation

## 2019-04-05 DIAGNOSIS — C50312 Malignant neoplasm of lower-inner quadrant of left female breast: Secondary | ICD-10-CM | POA: Insufficient documentation

## 2019-04-05 DIAGNOSIS — M1990 Unspecified osteoarthritis, unspecified site: Secondary | ICD-10-CM | POA: Insufficient documentation

## 2019-04-05 DIAGNOSIS — C50512 Malignant neoplasm of lower-outer quadrant of left female breast: Secondary | ICD-10-CM

## 2019-04-05 DIAGNOSIS — Z823 Family history of stroke: Secondary | ICD-10-CM | POA: Insufficient documentation

## 2019-04-05 DIAGNOSIS — Z800 Family history of malignant neoplasm of digestive organs: Secondary | ICD-10-CM | POA: Diagnosis not present

## 2019-04-05 DIAGNOSIS — Z5111 Encounter for antineoplastic chemotherapy: Secondary | ICD-10-CM | POA: Insufficient documentation

## 2019-04-05 DIAGNOSIS — Z8249 Family history of ischemic heart disease and other diseases of the circulatory system: Secondary | ICD-10-CM | POA: Diagnosis not present

## 2019-04-05 DIAGNOSIS — Z79899 Other long term (current) drug therapy: Secondary | ICD-10-CM | POA: Insufficient documentation

## 2019-04-05 DIAGNOSIS — I2510 Atherosclerotic heart disease of native coronary artery without angina pectoris: Secondary | ICD-10-CM | POA: Insufficient documentation

## 2019-04-05 DIAGNOSIS — Z923 Personal history of irradiation: Secondary | ICD-10-CM | POA: Diagnosis not present

## 2019-04-05 DIAGNOSIS — I10 Essential (primary) hypertension: Secondary | ICD-10-CM | POA: Diagnosis not present

## 2019-04-05 DIAGNOSIS — Z833 Family history of diabetes mellitus: Secondary | ICD-10-CM | POA: Insufficient documentation

## 2019-04-05 DIAGNOSIS — Z95828 Presence of other vascular implants and grafts: Secondary | ICD-10-CM

## 2019-04-05 DIAGNOSIS — Z882 Allergy status to sulfonamides status: Secondary | ICD-10-CM | POA: Diagnosis not present

## 2019-04-05 LAB — COMPREHENSIVE METABOLIC PANEL
ALT: 12 U/L (ref 0–44)
AST: 19 U/L (ref 15–41)
Albumin: 3.3 g/dL — ABNORMAL LOW (ref 3.5–5.0)
Alkaline Phosphatase: 72 U/L (ref 38–126)
Anion gap: 9 (ref 5–15)
BUN: 14 mg/dL (ref 8–23)
CO2: 27 mmol/L (ref 22–32)
Calcium: 8.4 mg/dL — ABNORMAL LOW (ref 8.9–10.3)
Chloride: 105 mmol/L (ref 98–111)
Creatinine, Ser: 0.88 mg/dL (ref 0.44–1.00)
GFR calc Af Amer: >60 mL/min (ref >60)
GFR calc non Af Amer: >60 mL/min (ref >60)
Glucose, Bld: 140 mg/dL — ABNORMAL HIGH (ref 70–99)
Potassium: 3.2 mmol/L — ABNORMAL LOW (ref 3.5–5.1)
Sodium: 141 mmol/L (ref 135–145)
Total Bilirubin: 0.4 mg/dL (ref 0.3–1.2)
Total Protein: 6.2 g/dL — ABNORMAL LOW (ref 6.5–8.1)

## 2019-04-05 LAB — CBC WITH DIFFERENTIAL/PLATELET
Abs Immature Granulocytes: 0.07 10*3/uL (ref 0.00–0.07)
Basophils Absolute: 0.1 10*3/uL (ref 0.0–0.1)
Basophils Relative: 1 %
Eosinophils Absolute: 0.4 10*3/uL (ref 0.0–0.5)
Eosinophils Relative: 6 %
HCT: 32.8 % — ABNORMAL LOW (ref 36.0–46.0)
Hemoglobin: 11.6 g/dL — ABNORMAL LOW (ref 12.0–15.0)
Immature Granulocytes: 1 %
Lymphocytes Relative: 25 %
Lymphs Abs: 1.8 10*3/uL (ref 0.7–4.0)
MCH: 29.7 pg (ref 26.0–34.0)
MCHC: 35.4 g/dL (ref 30.0–36.0)
MCV: 84.1 fL (ref 80.0–100.0)
Monocytes Absolute: 1.4 10*3/uL — ABNORMAL HIGH (ref 0.1–1.0)
Monocytes Relative: 20 %
Neutro Abs: 3.4 10*3/uL (ref 1.7–7.7)
Neutrophils Relative %: 47 %
Platelets: 274 10*3/uL (ref 150–400)
RBC: 3.9 MIL/uL (ref 3.87–5.11)
RDW: 15.9 % — ABNORMAL HIGH (ref 11.5–15.5)
WBC: 7.2 10*3/uL (ref 4.0–10.5)
nRBC: 0 % (ref 0.0–0.2)

## 2019-04-05 MED ORDER — DEXAMETHASONE SODIUM PHOSPHATE 10 MG/ML IJ SOLN
INTRAMUSCULAR | Status: AC
  Filled 2019-04-05: qty 1

## 2019-04-05 MED ORDER — SODIUM CHLORIDE 0.9% FLUSH
10.0000 mL | INTRAVENOUS | Status: DC | PRN
  Administered 2019-04-05: 10 mL
  Filled 2019-04-05: qty 10

## 2019-04-05 MED ORDER — SODIUM CHLORIDE 0.9 % IV SOLN
Freq: Once | INTRAVENOUS | Status: AC
  Administered 2019-04-05: 14:00:00 via INTRAVENOUS
  Filled 2019-04-05: qty 250

## 2019-04-05 MED ORDER — DEXAMETHASONE SODIUM PHOSPHATE 10 MG/ML IJ SOLN
10.0000 mg | Freq: Once | INTRAMUSCULAR | Status: AC
  Administered 2019-04-05: 10 mg via INTRAVENOUS

## 2019-04-05 MED ORDER — PALONOSETRON HCL INJECTION 0.25 MG/5ML
INTRAVENOUS | Status: AC
  Filled 2019-04-05: qty 5

## 2019-04-05 MED ORDER — PALONOSETRON HCL INJECTION 0.25 MG/5ML
0.2500 mg | Freq: Once | INTRAVENOUS | Status: AC
  Administered 2019-04-05: 15:00:00 0.25 mg via INTRAVENOUS

## 2019-04-05 MED ORDER — SODIUM CHLORIDE 0.9% FLUSH
10.0000 mL | Freq: Once | INTRAVENOUS | Status: AC
  Administered 2019-04-05: 10 mL
  Filled 2019-04-05: qty 10

## 2019-04-05 MED ORDER — HEPARIN SOD (PORK) LOCK FLUSH 100 UNIT/ML IV SOLN
500.0000 [IU] | Freq: Once | INTRAVENOUS | Status: AC | PRN
  Administered 2019-04-05: 500 [IU]
  Filled 2019-04-05: qty 5

## 2019-04-05 MED ORDER — FLUOROURACIL CHEMO INJECTION 2.5 GM/50ML
600.0000 mg/m2 | Freq: Once | INTRAVENOUS | Status: AC
  Administered 2019-04-05: 1200 mg via INTRAVENOUS
  Filled 2019-04-05: qty 24

## 2019-04-05 MED ORDER — METHOTREXATE SODIUM (PF) CHEMO INJECTION 250 MG/10ML
39.8000 mg/m2 | Freq: Once | INTRAMUSCULAR | Status: AC
  Administered 2019-04-05: 80 mg via INTRAVENOUS
  Filled 2019-04-05: qty 3.2

## 2019-04-05 MED ORDER — SODIUM CHLORIDE 0.9 % IV SOLN
600.0000 mg/m2 | Freq: Once | INTRAVENOUS | Status: AC
  Administered 2019-04-05: 1200 mg via INTRAVENOUS
  Filled 2019-04-05: qty 60

## 2019-04-05 NOTE — Progress Notes (Signed)
[place]  [phone] [phone]    ID: Gloria Mccoy DOB: 1945-11-29  MR#: [id number]  JQG#:[id number]  Patient Care Team: Redmond School, MD as PCP - General (Internal Medicine) Harl Bowie, Alphonse Guild, MD as PCP - Cardiology (Cardiology) Magrinat, Virgie Dad, MD as Consulting Physician (Oncology) Kyung Rudd, MD as Consulting Physician (Radiation Oncology) Rutherford Guys, MD as Consulting Physician (Ophthalmology) Jovita Kussmaul, MD as Consulting Physician (General Surgery) Mauro Kaufmann, RN as Oncology Nurse Navigator Rockwell Germany, RN as Oncology Nurse Navigator OTHER MD:    CHIEF COMPLAINT: Triple negative breast cancer  CURRENT TREATMENT: Adjuvant chemotherapy, adjuvant radiation   INTERVAL HISTORY: Gloria Mccoy was seen today for follow-up and treatment of her triple negative breast cancer.   Gloria Mccoy completed radiation on 7/31 and is healing well from this.  She continues on adjuvant chemotherapy consisting of cyclophosphamide, methotrexate, and fluorouracil (CMF), repeated every 21 days x 8. She is currently cycle 8 day 1 of her therapy, and is due to finish.  She is doing well today.    REVIEW OF SYSTEMS: Gloria Mccoy is doing well.  She denies any fatigue, and notes she is walking and doing yardwork.  She is without any issues, other than that her left nipple sometimes is hard.  She denies any fever or chills, chest pain, palpitations, cough, shortness of breath.  She has no nausea, vomiting, bowel/bladder changes, or any other concerns.  A detailed ROS was otherwise non contributory.    HISTORY OF CURRENT ILLNESS: From the original intake note:  Gloria Mccoy had routine screening mammography on 07/12/2018 showing a possible abnormality in the left breast. She underwent unilateral left diagnostic mammography with tomography and left breast ultrasonography at [facility] on 07/25/2018 showing: Breast Density Category B. There is a  mass with indistinct margins in the lower inner posterior left breast measuring approximately 0.6 cm. Targeted ultrasound of the lower left breast was performed. There is a hypoechoic mass with margin irregularity in the left breast at 7 o'clock, 7 cm from the nipple measuring 0.7 x 0.5 x 0.6 cm. No lymphadenopathy seen in the left axilla.   Accordingly on 08/01/2018 she proceeded to biopsy of the left breast area in question. The pathology from this procedure showed ([id number]): invasive ductal carcinoma, grade II. Prognostic indicators significant for: estrogen receptor, 0% negative and progesterone receptor, 0% negative. Proliferation marker Ki67 at 40%. HER2 negative (1+) by immunohistochemistry.   The patient's subsequent history is as detailed below.   PAST MEDICAL HISTORY: Past Medical History:  Diagnosis Date  . Arthritis    "hands sometimes" (07/13/2017)  . Coronary artery disease    a. s/p CABG x3 in 06/2017 with LIMA-LAD, SVG-D1, and SVG-RI.  b. 10/2017: cath showing a widely patent LIMA-LAD with ostial occlusion of the SVG-RI and subtotally occluded atretic SVG-D1. Graft occlusion thought to be 2ry to improvement in pre-CABG stenoses.   . Essential hypertension   . Family history of adverse reaction to anesthesia    sister had a complication that was stated she had a "foggy" episode after her breast surgery was readmitted 1 day post op after being discharged from the hospital. Sister also has significant lung problems that could have contributed to this   . Family history of breast cancer   . Family history of colon cancer   . Hypothyroid   . Meniere disease   . Myocardial infarction ([place]) 06/2017   during cardaic rehab  . Obesity   .  Osteopenia 01/2012   T score -1.3 FRAX 7.9%/0.6%     PAST SURGICAL HISTORY: Past Surgical History:  Procedure Laterality Date  . BREAST LUMPECTOMY WITH RADIOACTIVE SEED AND SENTINEL LYMPH NODE BIOPSY Left 09/01/2018   Procedure: LEFT BREAST  LUMPECTOMY WITH RADIOACTIVE SEED AND SENTINEL LYMPH NODE BIOPSY;  Surgeon: Jovita Kussmaul, MD;  Location: [facility];  Service: General;  Laterality: Left;  . BREAST SURGERY    . CARDIAC CATHETERIZATION  07/13/2017  . CATARACT EXTRACTION W/PHACO Right 01/28/2015   Procedure: CATARACT EXTRACTION PHACO AND INTRAOCULAR LENS PLACEMENT :  CDE:  5.70;  Surgeon: Rutherford Guys, MD;  Location: [facility];  Service: Ophthalmology;  Laterality: Right;  . CATARACT EXTRACTION W/PHACO Left 02/11/2015   Procedure: CATARACT EXTRACTION PHACO AND INTRAOCULAR LENS PLACEMENT (IOC);  Surgeon: Rutherford Guys, MD;  Location: [facility];  Service: Ophthalmology;  Laterality: Left;  CDE: 7.38  . COLONOSCOPY N/A 09/26/2012   Procedure: COLONOSCOPY;  Surgeon: Jamesetta So, MD;  [facility];  Service: Gastroenterology;  Laterality: N/A;  . CORONARY ARTERY BYPASS GRAFT N/A 07/15/2017   Procedure: CORONARY ARTERY BYPASS GRAFTING (CABG) X 3 USING LEFT INTERNAL MAMMARY ARTERY AND RIGHT SAPHENOUS VEIN- ENDOSCOPICALLY HARVESTED;  Surgeon: Melrose Nakayama, MD;  Location: [facility];  Service: Open Heart Surgery;  Laterality: N/A;  . IR IMAGING GUIDED PORT INSERTION  12/29/2018  . LEFT HEART CATH AND CORONARY ANGIOGRAPHY N/A 07/13/2017   Procedure: LEFT HEART CATH AND CORONARY ANGIOGRAPHY;  Surgeon: Martinique, Peter M, MD;  Location: [facility];  Service: Cardiovascular;  Laterality: N/A;  . LEFT HEART CATH AND CORS/GRAFTS ANGIOGRAPHY N/A 11/03/2017   Procedure: LEFT HEART CATH AND CORS/GRAFTS ANGIOGRAPHY;  Surgeon: Troy Sine, MD;  Location: [facility];  Service: Cardiovascular;  Laterality: N/A;  . TEE WITHOUT CARDIOVERSION N/A 07/15/2017   Procedure: TRANSESOPHAGEAL ECHOCARDIOGRAM (TEE);  Surgeon: Melrose Nakayama, MD;  Location: [facility];  Service: Open Heart Surgery;  Laterality: N/A;  . TUBAL LIGATION    . TYMPANOPLASTY Left    fluid from ear drum     FAMILY HISTORY: Family History  Problem Relation Age of  Onset  . Diabetes Sister        AODM  . Heart disease Sister 50       CABG  . Breast cancer Sister   . Heart disease Brother 48       In his 10s  . Colon cancer Maternal Uncle 108  . Diabetes Sister        AODM  . Hypertension Daughter   . Hypertension Daughter   . Breast cancer Cousin        PATERNAL COUSIN  . Heart attack Father        In his 76s  . Stroke Mother   . Stroke Maternal Grandmother   . Diabetes Maternal Grandfather        d. 109  . Heart attack Paternal Grandmother   . Colon cancer Maternal Uncle 67  . Breast cancer Cousin        dx in her 78s; mat first cousin  . Breast cancer Cousin        dx 42s; d. 43s  . Breast cancer Niece 81   Gloria Mccoy's father died from a myocardial infarction at age 66. Patients' mother died from a stroke at age 62. The patient has 3 brothers and 9 sisters. One of Gloria Mccoy's sisters, [name], was diagnosed with breast cancer at the age of 84.  Gloria Mccoy also has a niece that was diagnosed with breast cancer at 18, and two cousins that were diagnosed with breast cancer.  She believes 1 of her brothers may have prostate cancer.  Patient denies anyone in her family having ovarian or pancreatic cancer. Kazoua has an uncle that was diagnosed with colon cancer and an uncle that was diagnosed with "stomach" cancer.    GYNECOLOGIC HISTORY:  No LMP recorded. Patient is postmenopausal. 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 Her husband, Jason Fila, is a retired Administrator. They have two daughters. Their daughter, Gloria Mccoy, lives in [place] and is a housewife.  Their other daughter works at [employer]. Gloria Mccoy had one grandchild that is now deceased. She attends a [employer].    ADVANCED DIRECTIVES: Gloria Mccoy's husband, Jason Fila, is automatically her healthcare power of  attorney.     HEALTH MAINTENANCE: Social History   Tobacco Use  . Smoking status: Never Smoker  . Smokeless tobacco: Former Systems developer    Types: Snuff  Substance Use Topics  . Alcohol use: Yes    Alcohol/week: 1.0 standard drinks    Types: 1 Standard drinks or equivalent per week    Comment: occasionally  . Drug use: No    Colonoscopy: yes, Dr. Arnoldo Morale  PAP:   Bone density: yes, 2016, -1.1 osteopenic   Allergies  Allergen Reactions  . Aloe Vera Itching    Per patient severe itching  . Sulfa Antibiotics Shortness Of Breath  . Sulfasalazine Shortness Of Breath    Current Outpatient Medications  Medication Sig Dispense Refill  . acetaminophen (TYLENOL) 325 MG tablet Take 2 tablets (650 mg total) by mouth every 6 (six) hours as needed for mild pain.    Marland Kitchen aspirin EC 81 MG tablet Take 81 mg by mouth daily.    . cetaphil (CETAPHIL) lotion Apply 1 application topically as needed for dry skin. 236 mL 0  . Cholecalciferol (VITAMIN D PO) Take 1 tablet by mouth daily.     . Cyanocobalamin (VITAMIN B-12 PO) Take 1 tablet by mouth daily.    Marland Kitchen dexamethasone (DECADRON) 4 MG tablet Take 1 tablet (4 mg total) by mouth daily. Start the day after chemotherapy for 2 days. Take with food. 30 tablet 1  . fish oil-omega-3 fatty acids 1000 MG capsule Take 1 g by mouth daily.    . furosemide (LASIX) 40 MG tablet Take 1 tablet (40 mg total) by mouth daily. 90 tablet 3  . HYDROcodone-acetaminophen (NORCO/VICODIN) 5-325 MG tablet Take 1-2 tablets by mouth every 6 (six) hours as needed for moderate pain or severe pain. 60 tablet 0  . levothyroxine (SYNTHROID) 25 MCG tablet     . levothyroxine (SYNTHROID, LEVOTHROID) 75 MCG tablet Take 75 mcg by mouth daily.    Marland Kitchen lidocaine-prilocaine (EMLA) cream Apply 1 application topically as needed. 30 g 0  . losartan (COZAAR) 25 MG tablet Take 0.5 tablets (12.5 mg total) by mouth daily. 45 tablet 3  . meloxicam (MOBIC) 15 MG tablet Take 15 mg by mouth daily.    .  metoprolol tartrate (LOPRESSOR) 25 MG tablet Take 1 tablet (25 mg total) by mouth 2 (two) times daily. 180 tablet 3  . mineral oil-hydrophilic petrolatum (AQUAPHOR) ointment Apply topically as needed for dry skin. 420 g 0  .  prochlorperazine (COMPAZINE) 10 MG tablet Take 1 tablet (10 mg total) by mouth 3 (three) times daily before meals. 30 tablet 1  . tobramycin-dexamethasone (TOBRADEX) ophthalmic solution Place 2 drops into the right eye every 4 (four) hours while awake. 5 mL 0  . vitamin E 100 UNIT capsule Take 1 capsule (100 Units total) by mouth daily. 30 capsule 0   No current facility-administered medications for this visit.      OBJECTIVE:  Vitals:   04/05/19 1328  BP: (!) 124/40  Pulse: (!) 59  Resp: 18  Temp: 98.2 F (36.8 C)  SpO2: 96%     Body mass index is 32.88 kg/m.   Wt Readings from Last 3 Encounters:  04/05/19 203 lb 11.2 oz (92.4 kg)  03/15/19 204 lb (92.5 kg)  02/27/19 189 lb (85.7 kg)  ECOG FS:1 - Symptomatic but completely ambulatory GENERAL: Patient is a well appearing female in no acute distress HEENT:  Sclerae anicteric.   Oropharynx clear and moist. No ulcerations or evidence of oropharyngeal candidiasis. Neck is supple.  NODES:  No cervical, supraclavicular, or axillary lymphadenopathy palpated.  BREAST EXAM:  Right breast benign, left breast s/p lumpectomy and radiation, no sign of local recurrence, skin is thickened and hyperpigmented from radiation LUNGS:  Clear to auscultation bilaterally.  No wheezes or rhonchi. HEART:  Regular rate and rhythm. No murmur appreciated. ABDOMEN:  Soft, nontender.  Positive, normoactive bowel sounds. No organomegaly palpated. MSK:  No focal spinal tenderness to palpation. Full range of motion bilaterally in the upper extremities. EXTREMITIES:  Scant bilateral lower extremity edema SKIN:  Clear with no obvious rashes or skin changes. No nail dyscrasia. NEURO:  Nonfocal. Well oriented.  Appropriate affect.     LAB  RESULTS:  CMP     Component Value Date/Time   NA 142 03/15/2019 1225   K 3.4 (L) 03/15/2019 1225   CL 109 03/15/2019 1225   CO2 28 03/15/2019 1225   GLUCOSE 99 03/15/2019 1225   BUN 10 03/15/2019 1225   CREATININE 0.77 03/15/2019 1225   CREATININE 0.78 01/16/2019 0840   CALCIUM 8.8 (L) 03/15/2019 1225   PROT 6.7 03/15/2019 1225   ALBUMIN 3.5 03/15/2019 1225   AST 20 03/15/2019 1225   AST 22 01/16/2019 0840   ALT 12 03/15/2019 1225   ALT 10 01/16/2019 0840   ALKPHOS 81 03/15/2019 1225   BILITOT 0.6 03/15/2019 1225   BILITOT 0.7 01/16/2019 0840   GFRNONAA >60 03/15/2019 1225   GFRNONAA >60 01/16/2019 0840   GFRAA >60 03/15/2019 1225   GFRAA >60 01/16/2019 0840    No results found for: TOTALPROTELP, ALBUMINELP, A1GS, A2GS, BETS, BETA2SER, GAMS, MSPIKE, SPEI  No results found for: KPAFRELGTCHN, LAMBDASER, KAPLAMBRATIO  Lab Results  Component Value Date   WBC 7.2 04/05/2019   NEUTROABS 3.4 04/05/2019   HGB 11.6 (L) 04/05/2019   HCT 32.8 (L) 04/05/2019   MCV 84.1 04/05/2019   PLT 274 04/05/2019    '@LASTCHEMISTRY' @  No results found for: LABCA2  No components found for: BLTJQZ009  No results for input(s): INR in the last 168 hours.  No results found for: LABCA2  No results found for: QZR007  No results found for: MAU633  No results found for: HLK562  No results found for: CA2729  No components found for: HGQUANT  No results found for: CEA1 / No results found for: CEA1   No results found for: AFPTUMOR  No results found for: [place]  No results found for: PSA1  Appointment on 04/05/2019  Component Date Value Ref Range Status  . WBC 04/05/2019 7.2  4.0 - 10.5 K/uL Final  . RBC 04/05/2019 3.90  3.87 - 5.11 MIL/uL Final  . Hemoglobin 04/05/2019 11.6* 12.0 - 15.0 g/dL Final  . HCT 04/05/2019 32.8* 36.0 - 46.0 % Final  . MCV 04/05/2019 84.1  80.0 - 100.0 fL Final  . MCH 04/05/2019 29.7  26.0 - 34.0 pg Final  . MCHC 04/05/2019 35.4  30.0 - 36.0 g/dL  Final  . RDW 04/05/2019 15.9* 11.5 - 15.5 % Final  . Platelets 04/05/2019 274  150 - 400 K/uL Final  . nRBC 04/05/2019 0.0  0.0 - 0.2 % Final  . Neutrophils Relative % 04/05/2019 47  % Final  . Neutro Abs 04/05/2019 3.4  1.7 - 7.7 K/uL Final  . Lymphocytes Relative 04/05/2019 25  % Final  . Lymphs Abs 04/05/2019 1.8  0.7 - 4.0 K/uL Final  . Monocytes Relative 04/05/2019 20  % Final  . Monocytes Absolute 04/05/2019 1.4* 0.1 - 1.0 K/uL Final  . Eosinophils Relative 04/05/2019 6  % Final  . Eosinophils Absolute 04/05/2019 0.4  0.0 - 0.5 K/uL Final  . Basophils Relative 04/05/2019 1  % Final  . Basophils Absolute 04/05/2019 0.1  0.0 - 0.1 K/uL Final  . Immature Granulocytes 04/05/2019 1  % Final  . Abs Immature Granulocytes 04/05/2019 0.07  0.00 - 0.07 K/uL Final   Performed at [facility], [address] [place], [place] [address]    (this displays the last labs from the last 3 days)  No results found for: TOTALPROTELP, ALBUMINELP, A1GS, A2GS, BETS, BETA2SER, GAMS, MSPIKE, SPEI (this displays SPEP labs)  No results found for: KPAFRELGTCHN, LAMBDASER, KAPLAMBRATIO (kappa/lambda light chains)  No results found for: HGBA, HGBA2QUANT, HGBFQUANT, HGBSQUAN (Hemoglobinopathy evaluation)   No results found for: LDH  No results found for: IRON, TIBC, IRONPCTSAT (Iron and TIBC)  No results found for: FERRITIN  Urinalysis    Component Value Date/Time   COLORURINE YELLOW 07/14/2017 2015   APPEARANCEUR HAZY (A) 07/14/2017 2015   LABSPEC 1.011 07/14/2017 2015   PHURINE 6.0 07/14/2017 2015   [place] 07/14/2017 2015   [place] 07/14/2017 2015   [place] 07/14/2017 2015   [place] 07/14/2017 2015   PROTEINUR NEGATIVE 07/14/2017 2015   UROBILINOGEN 0.2 07/03/2014 0852   NITRITE NEGATIVE 07/14/2017 2015   LEUKOCYTESUR LARGE (A) 07/14/2017 2015     STUDIES:  No results found.   ELIGIBLE FOR AVAILABLE RESEARCH  PROTOCOL: no   ASSESSMENT: 73 y.o. [place], [place] woman status post left breast lower inner quadrant biopsy 08/01/2018 for a clinical T1c N0, stage IB invasive ductal carcinoma, grade 2, triple negative, with an MIB-1 of 40%  (1) genetics testing 08/29/2018 through with Hereditary Gene Panel offered by [facility] found no deleterious mutations in APC, ATM, AXIN2, BARD1, BMPR1A, BRCA1, BRCA2, BRIP1, CDH1, CDK4, CDKN2A (p14ARF), CDKN2A (p16INK4a), CHEK2, CTNNA1, DICER1, EPCAM (Deletion/duplication testing only), GREM1 (promoter region deletion/duplication testing only), KIT, MEN1, MLH1, MSH2, MSH3, MSH6, MUTYH, NBN, NF1, NHTL1, PALB2, PDGFRA, PMS2, POLD1, POLE, PTEN, RAD50, RAD51C, RAD51D, SDHB, SDHC, SDHD, SMAD4, SMARCA4. STK11, TP53, TSC1, TSC2, and VHL.  The following genes were evaluated for sequence changes only: SDHA and HOXB13 c.251G>A variant only.   (a) MLH1 c.1890T>G VUS identified  (2) status post left lumpectomy with sentinel lymph node sampling 09/01/2022 a pT1c pN0, stage IB invasive ductal carcinoma, grade 2, with negative margins.  (a) a  total of 3 sentinel lymph nodes were removed  (3) chemotherapy consisting of cyclophosphamide, methotrexate, and fluorouracil (CMF), repeated every 21 days x 8, from 10/24/2018-04/05/2019  (4) adjuvant radiation given concurrently with cycles 3-4-5 of chemotherapy from 12/05/2018-01/19/2019.  The left breast was treated to 50.4 Gy in 28 fractions, followed by a 10 Gy boost to the lumpectomy cavity.   PLAN: Damarys is doing well today.  Her labs remain stable and she will complete her final chemotherapy treatment today with CMF.  She and I reviewed her plan now, which is to monitor her closely to ensure she doesn't develop any signs of recurrence.    Merdith was recommended healthy diet and exercise.  I counseled her on weight loss and encouraged her to lose some weight as it can not only reduce her risk for future cancers, but also improve her heart  health.  She understands this.  Ayisha will return in 3 months for SCP visit.  She was recommended to continue with the appropriate pandemic precautions. She knows to call for any questions that may arise between now and her next appointment.  We are happy to see her sooner if needed.  A total of (20) minutes of face-to-face time was spent with this patient with greater than 50% of that time in counseling and care-coordination.   Wilber Bihari, NP  04/05/19 1:30 PM Medical Oncology and Hematology [facility] [address] [place], [place] [address] Tel. [phone]    Fax. [phone]

## 2019-04-05 NOTE — Patient Instructions (Signed)

## 2019-04-05 NOTE — Patient Instructions (Signed)
[facility] Discharge Instructions for Patients Receiving Chemotherapy  Today you received the following chemotherapy agents: Cytoxan, methotrexate, 5FU  To help prevent nausea and vomiting after your treatment, we encourage you to take your nausea medication as directed.    If you develop nausea and vomiting that is not controlled by your nausea medication, call the clinic.   BELOW ARE SYMPTOMS THAT SHOULD BE REPORTED IMMEDIATELY:  *FEVER GREATER THAN 100.5 F  *CHILLS WITH OR WITHOUT FEVER  NAUSEA AND VOMITING THAT IS NOT CONTROLLED WITH YOUR NAUSEA MEDICATION  *UNUSUAL SHORTNESS OF BREATH  *UNUSUAL BRUISING OR BLEEDING  TENDERNESS IN MOUTH AND THROAT WITH OR WITHOUT PRESENCE OF ULCERS  *URINARY PROBLEMS  *BOWEL PROBLEMS  UNUSUAL RASH Items with * indicate a potential emergency and should be followed up as soon as possible.  Feel free to call the clinic should you have any questions or concerns. The clinic phone number is [phone].  Please show the CHEMO ALERT CARD at check-in to the Emergency Department and triage nurse.   

## 2019-04-06 ENCOUNTER — Encounter: Payer: Self-pay | Admitting: *Deleted

## 2019-04-06 ENCOUNTER — Other Ambulatory Visit: Payer: Self-pay | Admitting: Adult Health

## 2019-04-06 ENCOUNTER — Telehealth: Payer: Self-pay | Admitting: Adult Health

## 2019-04-06 NOTE — Telephone Encounter (Signed)
I talk with patient regarding schedule  

## 2019-04-09 ENCOUNTER — Encounter: Payer: Self-pay | Admitting: *Deleted

## 2019-04-24 ENCOUNTER — Other Ambulatory Visit: Payer: Self-pay | Admitting: Radiology

## 2019-04-25 ENCOUNTER — Other Ambulatory Visit: Payer: Self-pay | Admitting: Radiology

## 2019-04-26 ENCOUNTER — Other Ambulatory Visit: Payer: Self-pay

## 2019-04-26 ENCOUNTER — Ambulatory Visit (HOSPITAL_COMMUNITY)
Admission: RE | Admit: 2019-04-26 | Discharge: 2019-04-26 | Disposition: A | Discharge status: Home / Self Care | Payer: Medicare HMO | Source: Ambulatory Visit | Attending: Oncology | Admitting: Oncology

## 2019-04-26 ENCOUNTER — Ambulatory Visit (HOSPITAL_COMMUNITY)
Admission: RE | Admit: 2019-04-26 | Discharge: 2019-04-26 | Disposition: A | Discharge status: Home / Self Care | Payer: Medicare HMO | Source: Ambulatory Visit | Attending: Adult Health | Admitting: Adult Health

## 2019-04-26 ENCOUNTER — Encounter (HOSPITAL_COMMUNITY): Payer: Self-pay

## 2019-04-26 DIAGNOSIS — Z79890 Hormone replacement therapy (postmenopausal): Secondary | ICD-10-CM | POA: Insufficient documentation

## 2019-04-26 DIAGNOSIS — Z853 Personal history of malignant neoplasm of breast: Secondary | ICD-10-CM | POA: Diagnosis not present

## 2019-04-26 DIAGNOSIS — I10 Essential (primary) hypertension: Secondary | ICD-10-CM | POA: Diagnosis not present

## 2019-04-26 DIAGNOSIS — E669 Obesity, unspecified: Secondary | ICD-10-CM | POA: Insufficient documentation

## 2019-04-26 DIAGNOSIS — Z7982 Long term (current) use of aspirin: Secondary | ICD-10-CM | POA: Diagnosis not present

## 2019-04-26 DIAGNOSIS — I2510 Atherosclerotic heart disease of native coronary artery without angina pectoris: Secondary | ICD-10-CM | POA: Diagnosis not present

## 2019-04-26 DIAGNOSIS — C50512 Malignant neoplasm of lower-outer quadrant of left female breast: Secondary | ICD-10-CM

## 2019-04-26 DIAGNOSIS — Z452 Encounter for adjustment and management of vascular access device: Secondary | ICD-10-CM | POA: Insufficient documentation

## 2019-04-26 DIAGNOSIS — I252 Old myocardial infarction: Secondary | ICD-10-CM | POA: Insufficient documentation

## 2019-04-26 DIAGNOSIS — Z79899 Other long term (current) drug therapy: Secondary | ICD-10-CM | POA: Insufficient documentation

## 2019-04-26 DIAGNOSIS — M1990 Unspecified osteoarthritis, unspecified site: Secondary | ICD-10-CM | POA: Insufficient documentation

## 2019-04-26 DIAGNOSIS — E039 Hypothyroidism, unspecified: Secondary | ICD-10-CM | POA: Diagnosis not present

## 2019-04-26 HISTORY — PX: IR REMOVAL TUN ACCESS W/ PORT W/O FL MOD SED: IMG2290

## 2019-04-26 LAB — BASIC METABOLIC PANEL
Anion gap: 9 (ref 5–15)
BUN: 11 mg/dL (ref 8–23)
CO2: 25 mmol/L (ref 22–32)
Calcium: 9 mg/dL (ref 8.9–10.3)
Chloride: 107 mmol/L (ref 98–111)
Creatinine, Ser: 0.85 mg/dL (ref 0.44–1.00)
GFR calc Af Amer: >60 mL/min (ref >60)
GFR calc non Af Amer: >60 mL/min (ref >60)
Glucose, Bld: 96 mg/dL (ref 70–99)
Potassium: 3.6 mmol/L (ref 3.5–5.1)
Sodium: 141 mmol/L (ref 135–145)

## 2019-04-26 LAB — PROTIME-INR
INR: 1 (ref 0.8–1.2)
Prothrombin Time: 13 seconds (ref 11.4–15.2)

## 2019-04-26 LAB — CBC WITH DIFFERENTIAL/PLATELET
Abs Immature Granulocytes: 0.15 10*3/uL — ABNORMAL HIGH (ref 0.00–0.07)
Basophils Absolute: 0.1 10*3/uL (ref 0.0–0.1)
Basophils Relative: 1 %
Eosinophils Absolute: 0.3 10*3/uL (ref 0.0–0.5)
Eosinophils Relative: 4 %
HCT: 37.8 % (ref 36.0–46.0)
Hemoglobin: 12.9 g/dL (ref 12.0–15.0)
Immature Granulocytes: 2 %
Lymphocytes Relative: 27 %
Lymphs Abs: 2.1 10*3/uL (ref 0.7–4.0)
MCH: 29.4 pg (ref 26.0–34.0)
MCHC: 34.1 g/dL (ref 30.0–36.0)
MCV: 86.1 fL (ref 80.0–100.0)
Monocytes Absolute: 1.3 10*3/uL — ABNORMAL HIGH (ref 0.1–1.0)
Monocytes Relative: 18 %
Neutro Abs: 3.7 10*3/uL (ref 1.7–7.7)
Neutrophils Relative %: 48 %
Platelets: 268 10*3/uL (ref 150–400)
RBC: 4.39 MIL/uL (ref 3.87–5.11)
RDW: 16.7 % — ABNORMAL HIGH (ref 11.5–15.5)
WBC: 7.6 10*3/uL (ref 4.0–10.5)
nRBC: 0 % (ref 0.0–0.2)

## 2019-04-26 MED ORDER — CEFAZOLIN SODIUM-DEXTROSE 2-4 GM/100ML-% IV SOLN
INTRAVENOUS | Status: AC
  Filled 2019-04-26: qty 100

## 2019-04-26 MED ORDER — CEFAZOLIN SODIUM-DEXTROSE 2-4 GM/100ML-% IV SOLN: 2.0000 g | INTRAVENOUS | Status: DC

## 2019-04-26 MED ORDER — SODIUM CHLORIDE 0.9 % IV SOLN
INTRAVENOUS | Status: DC
  Administered 2019-04-26: 14:00:00 via INTRAVENOUS

## 2019-04-26 MED ORDER — MIDAZOLAM HCL 2 MG/2ML IJ SOLN
INTRAMUSCULAR | Status: AC | PRN
  Administered 2019-04-26 (×2): 1 mg via INTRAVENOUS

## 2019-04-26 MED ORDER — LIDOCAINE HCL (PF) 1 % IJ SOLN
INTRAMUSCULAR | Status: AC | PRN
  Administered 2019-04-26: 10 mL

## 2019-04-26 MED ORDER — FENTANYL CITRATE (PF) 100 MCG/2ML IJ SOLN
INTRAMUSCULAR | Status: AC
  Filled 2019-04-26: qty 2

## 2019-04-26 MED ORDER — FENTANYL CITRATE (PF) 100 MCG/2ML IJ SOLN
INTRAMUSCULAR | Status: AC | PRN
  Administered 2019-04-26 (×2): 50 ug via INTRAVENOUS

## 2019-04-26 MED ORDER — MIDAZOLAM HCL 2 MG/2ML IJ SOLN
INTRAMUSCULAR | Status: AC
  Filled 2019-04-26: qty 2

## 2019-04-26 NOTE — Procedures (Signed)
Interventional Radiology Procedure Note  Procedure: Port removal  Complications: None  Estimated Blood Loss: < 10 mL  Findings: Right chest port removed. Wound closed.  Gloria Mccoy. Kathlene Cote, M.D Pager:  [phone]

## 2019-04-26 NOTE — H&P (Signed)
Chief Complaint: Patient was seen in consultation today for breast cancer in remission/Port-a-cath removal.  Referring Physician(s): [place]  Supervising Physician: Aletta Edouard  Patient Status: [facility] - Out-pt  History of Present Illness: Gloria Mccoy is a 73 y.o. female with a past medical history of hypertension, CAD, MI 06/2017, triple negative breast cancer, meniere disease, hypothyroidism, arthritis, osteopenia, and obesity. She was unfortunately diagnosed with triple negative left breast cancer in 07/2018. Her cancer is managed by Dr. Jana Hakim and Wilber Bihari, NP. She had a Port-a-cath placed in IR 12/29/2018 by Dr. Laurence Ferrari. She has completed a round of adjuvant chemotherapy and adjuvant radiation. Based on most recent work-up, she is currently in remission.  IR requested by Wilber Bihari, NP for possible Port-a-cath removal. Patient awake and alert laying in bed with no complaints at this time. Denies fever, chills, chest pain, dyspnea, abdominal pain, or headache.   Past Medical History:  Diagnosis Date  . Arthritis    "hands sometimes" (07/13/2017)  . Coronary artery disease    a. s/p CABG x3 in 06/2017 with LIMA-LAD, SVG-D1, and SVG-RI.  b. 10/2017: cath showing a widely patent LIMA-LAD with ostial occlusion of the SVG-RI and subtotally occluded atretic SVG-D1. Graft occlusion thought to be 2ry to improvement in pre-CABG stenoses.   . Essential hypertension   . Family history of adverse reaction to anesthesia    sister had a complication that was stated she had a "foggy" episode after her breast surgery was readmitted 1 day post op after being discharged from the hospital. Sister also has significant lung problems that could have contributed to this   . Family history of breast cancer   . Family history of colon cancer   . Hypothyroid   . Meniere disease   . Myocardial infarction ([place]) 06/2017   during cardaic rehab  . Obesity   . Osteopenia  01/2012   T score -1.3 FRAX 7.9%/0.6%    Past Surgical History:  Procedure Laterality Date  . BREAST LUMPECTOMY WITH RADIOACTIVE SEED AND SENTINEL LYMPH NODE BIOPSY Left 09/01/2018   Procedure: LEFT BREAST LUMPECTOMY WITH RADIOACTIVE SEED AND SENTINEL LYMPH NODE BIOPSY;  Surgeon: Jovita Kussmaul, MD;  Location: [place];  Service: General;  Laterality: Left;  . BREAST SURGERY    . CARDIAC CATHETERIZATION  07/13/2017  . CATARACT EXTRACTION W/PHACO Right 01/28/2015   Procedure: CATARACT EXTRACTION PHACO AND INTRAOCULAR LENS PLACEMENT :  CDE:  5.70;  Surgeon: Rutherford Guys, MD;  Location: [facility];  Service: Ophthalmology;  Laterality: Right;  . CATARACT EXTRACTION W/PHACO Left 02/11/2015   Procedure: CATARACT EXTRACTION PHACO AND INTRAOCULAR LENS PLACEMENT (IOC);  Surgeon: Rutherford Guys, MD;  Location: [facility];  Service: Ophthalmology;  Laterality: Left;  CDE: 7.38  . COLONOSCOPY N/A 09/26/2012   Procedure: COLONOSCOPY;  Surgeon: Jamesetta So, MD;  Location: [facility];  Service: Gastroenterology;  Laterality: N/A;  . CORONARY ARTERY BYPASS GRAFT N/A 07/15/2017   Procedure: CORONARY ARTERY BYPASS GRAFTING (CABG) X 3 USING LEFT INTERNAL MAMMARY ARTERY AND RIGHT SAPHENOUS VEIN- ENDOSCOPICALLY HARVESTED;  Surgeon: Melrose Nakayama, MD;  Location: [place];  Service: Open Heart Surgery;  Laterality: N/A;  . IR IMAGING GUIDED PORT INSERTION  12/29/2018  . LEFT HEART CATH AND CORONARY ANGIOGRAPHY N/A 07/13/2017   Procedure: LEFT HEART CATH AND CORONARY ANGIOGRAPHY;  Surgeon: Martinique, Peter M, MD;  Location: [place] [facility];  Service: Cardiovascular;  Laterality: N/A;  . LEFT HEART CATH AND CORS/GRAFTS ANGIOGRAPHY N/A 11/03/2017  Procedure: LEFT HEART CATH AND CORS/GRAFTS ANGIOGRAPHY;  Surgeon: Troy Sine, MD;  Location: [place] [facility];  Service: Cardiovascular;  Laterality: N/A;  . TEE WITHOUT CARDIOVERSION N/A 07/15/2017   Procedure: TRANSESOPHAGEAL ECHOCARDIOGRAM (TEE);  Surgeon: Melrose Nakayama, MD;  Location: [place];  Service: Open Heart Surgery;  Laterality: N/A;  . TUBAL LIGATION    . TYMPANOPLASTY Left    fluid from ear drum    Allergies: Aloe vera, Sulfa antibiotics, and Sulfasalazine  Medications: Prior to Admission medications   Medication Sig Start Date End Date Taking? Authorizing Provider  acetaminophen (TYLENOL) 325 MG tablet Take 2 tablets (650 mg total) by mouth every 6 (six) hours as needed for mild pain. 07/20/17   Nani Skillern, PA-C  aspirin EC 81 MG tablet Take 81 mg by mouth daily.    [provider]  cetaphil (CETAPHIL) lotion Apply 1 application topically as needed for dry skin. 03/15/19   Gardenia Phlegm, NP  Cholecalciferol (VITAMIN D PO) Take 1 tablet by mouth daily.     [provider]  Cyanocobalamin (VITAMIN B-12 PO) Take 1 tablet by mouth daily.    [provider]  dexamethasone (DECADRON) 4 MG tablet Take 1 tablet (4 mg total) by mouth daily. Start the day after chemotherapy for 2 days. Take with food. 10/20/18   Magrinat, Virgie Dad, MD  fish oil-omega-3 fatty acids 1000 MG capsule Take 1 g by mouth daily.    [provider]  furosemide (LASIX) 40 MG tablet Take 1 tablet (40 mg total) by mouth daily. 02/27/19   Arnoldo Lenis, MD  HYDROcodone-acetaminophen (NORCO/VICODIN) 5-325 MG tablet Take 1-2 tablets by mouth every 6 (six) hours as needed for moderate pain or severe pain. 01/15/19   Hayden Pedro, PA-C  levothyroxine (SYNTHROID) 25 MCG tablet  12/18/18   [provider]  levothyroxine (SYNTHROID, LEVOTHROID) 75 MCG tablet Take 75 mcg by mouth daily.    [provider]  lidocaine-prilocaine (EMLA) cream Apply 1 application topically as needed. 12/26/18   Magrinat, Virgie Dad, MD  losartan (COZAAR) 25 MG tablet Take 0.5 tablets (12.5 mg total) by mouth daily. 02/27/19 05/28/19  Arnoldo Lenis, MD  meloxicam (MOBIC) 15 MG tablet Take 15 mg by mouth daily.    [provider]  metoprolol tartrate (LOPRESSOR) 25 MG tablet Take 1 tablet (25 mg total) by mouth 2 (two) times daily. 02/27/19   Arnoldo Lenis, MD  mineral oil-hydrophilic petrolatum (AQUAPHOR) ointment Apply topically as needed for dry skin. 03/15/19   Causey, Charlestine Massed, NP  tobramycin-dexamethasone [facility]) ophthalmic solution Place 2 drops into the right eye every 4 (four) hours while awake. Patient not taking: Reported on 04/05/2019 02/22/19   Gardenia Phlegm, NP  vitamin E 100 UNIT capsule Take 1 capsule (100 Units total) by mouth daily. 03/15/19   Gardenia Phlegm, NP     Family History  Problem Relation Age of Onset  . Diabetes Sister        AODM  . Heart disease Sister 65       CABG  . Breast cancer Sister   . Heart disease Brother 29       In his 61s  . Colon cancer Maternal Uncle 59  . Diabetes Sister        AODM  . Hypertension Daughter   . Hypertension Daughter   . Breast cancer Cousin        PATERNAL COUSIN  .  Heart attack Father        In his 49s  . Stroke Mother   . Stroke Maternal Grandmother   . Diabetes Maternal Grandfather        d. 109  . Heart attack Paternal Grandmother   . Colon cancer Maternal Uncle 68  . Breast cancer Cousin        dx in her 29s; mat first cousin  . Breast cancer Cousin        dx 2s; d. 88s  . Breast cancer Niece 39    Social History   Socioeconomic History  . Marital status: Married    Spouse name: Not on file  . Number of children: Not on file  . Years of education: Not on file  . Highest education level: Not on file  Occupational History  . Occupation: Retired  Scientific laboratory technician  . Financial resource strain: Not on file  . Food insecurity    Worry: Not on file    Inability: Not on file  . Transportation needs    Medical: No    Non-medical: No  Tobacco Use  . Smoking status: Never Smoker  . Smokeless tobacco: Former Systems developer    Types: Snuff  Substance and Sexual Activity  . Alcohol use: Yes     Alcohol/week: 1.0 standard drinks    Types: 1 Standard drinks or equivalent per week    Comment: occasionally  . Drug use: No  . Sexual activity: Yes    Birth control/protection: Surgical  Lifestyle  . Physical activity    Days per week: Not on file    Minutes per session: Not on file  . Stress: Not on file  Relationships  . Social Herbalist on phone: Not on file    Gets together: Not on file    Attends religious service: Not on file    Active member of club or organization: Not on file    Attends meetings of clubs or organizations: Not on file    Relationship status: Not on file  Other Topics Concern  . Not on file  Social History Narrative  . Not on file     Review of Systems: A 12 point ROS discussed and pertinent positives are indicated in the HPI above.  All other systems are negative.  Review of Systems  Constitutional: Negative for chills and fever.  Respiratory: Negative for shortness of breath and wheezing.   Cardiovascular: Negative for chest pain and palpitations.  Gastrointestinal: Negative for abdominal pain.  Neurological: Negative for headaches.  Psychiatric/Behavioral: Negative for behavioral problems and confusion.    Vital Signs: BP 132/65   Pulse 63   Temp 98.3 F (36.8 C) (Oral)   Resp 16   Ht 5\' 2"  (1.575 m)   Wt 191 lb (86.6 kg)   SpO2 99%   BMI 34.93 kg/m   Physical Exam Vitals signs and nursing note reviewed.  Constitutional:      General: She is not in acute distress.    Appearance: Normal appearance.  Cardiovascular:     Rate and Rhythm: Normal rate and regular rhythm.     Heart sounds: Normal heart sounds. No murmur.  Pulmonary:     Effort: Pulmonary effort is normal. No respiratory distress.     Breath sounds: Normal breath sounds. No wheezing.  Skin:    General: Skin is warm and dry.  Neurological:     Mental Status: She is alert and oriented to person, place, and  time.  Psychiatric:        Mood and Affect: Mood  normal.        Behavior: Behavior normal.        Thought Content: Thought content normal.        Judgment: Judgment normal.      MD Evaluation Airway: WNL Heart: WNL Abdomen: WNL Chest/ Lungs: WNL ASA  Classification: 3 Mallampati/Airway Score: Two   Imaging: No results found.  Labs:  CBC: Recent Labs    02/22/19 1252 03/15/19 1225 04/05/19 1307 04/26/19 1338  WBC 7.5 7.8 7.2 7.6  HGB 12.0 12.1 11.6* 12.9  HCT 34.3* 34.6* 32.8* 37.8  PLT 187 228 274 268    COAGS: Recent Labs    12/29/18 1239 04/26/19 1338  INR 1.1 1.0    BMP: Recent Labs    02/22/19 1252 03/15/19 1225 04/05/19 1307 04/26/19 1338  NA 144 142 141 141  K 3.4* 3.4* 3.2* 3.6  CL 107 109 105 107  CO2 31 28 27 25   GLUCOSE 116* 99 140* 96  BUN 10 10 14 11   CALCIUM 8.8* 8.8* 8.4* 9.0  CREATININE 0.82 0.77 0.88 0.85  GFRNONAA >60 >60 >60 >60  GFRAA >60 >60 >60 >60    LIVER FUNCTION TESTS: Recent Labs    02/15/19 1102 02/22/19 1252 03/15/19 1225 04/05/19 1307  BILITOT 1.1 0.6 0.6 0.4  AST 18 20 20 19   ALT 12 13 12 12   ALKPHOS 74 73 81 72  PROT 6.9 6.6 6.7 6.2*  ALBUMIN 3.5 3.4* 3.5 3.3*     Assessment and Plan:  Breast cancer currently in remission. Plan for Port-a-cath removal today in IR. Patient is NPO. Afebrile and WBCs WNL. She does not take blood thinners. INR 1.0 today.  Risks and benefits of image guided port-a-catheter removal were discussed with the patient including, but not limited to bleeding, infection, pneumothorax, or need for additional procedures. All of the patient's questions were answered, patient is agreeable to proceed. Consent signed and in chart.   Thank you for this interesting consult.  I greatly enjoyed meeting MONIE STROZEWSKI and look forward to participating in their care.  A copy of this report was sent to the requesting provider on this date.  Electronically Signed: Earley Abide, PA-C 04/26/2019, 2:16 PM   I spent a total of 40  Minutes in face to face in clinical consultation, greater than 50% of which was counseling/coordinating care for breast cancer in remission/Port-a-cath removal.

## 2019-04-26 NOTE — Discharge Instructions (Signed)
Implanted Port Removal, Care After °This sheet gives you information about how to care for yourself after your procedure. Your health care provider may also give you more specific instructions. If you have problems or questions, contact your health care provider. °What can I expect after the procedure? °After the procedure, it is common to have: °· Soreness or pain near your incision. °· Some swelling or bruising near your incision. °Follow these instructions at home: °Medicines °· Take over-the-counter and prescription medicines only as told by your health care provider. °· If you were prescribed an antibiotic medicine, take it as told by your health care provider. Do not stop taking the antibiotic even if you start to feel better. °Bathing °· Do not take baths, swim, or use a hot tub until your health care provider approves. Ask your health care provider if you can take showers. You may only be allowed to take sponge baths. °Incision care ° °· Follow instructions from your health care provider about how to take care of your incision. Make sure you: °? Wash your hands with soap and water before you change your bandage (dressing). If soap and water are not available, use hand sanitizer. °? Change your dressing as told by your health care provider. °? Keep your dressing dry. °? Leave stitches (sutures), skin glue, or adhesive strips in place. These skin closures may need to stay in place for 2 weeks or longer. If adhesive strip edges start to loosen and curl up, you may trim the loose edges. Do not remove adhesive strips completely unless your health care provider tells you to do that. °· Check your incision area every day for signs of infection. Check for: °? More redness, swelling, or pain. °? More fluid or blood. °? Warmth. °? Pus or a bad smell. °Driving ° °· Do not drive for 24 hours if you were given a medicine to help you relax (sedative) during your procedure. °· If you did not receive a sedative, ask your  health care provider when it is safe to drive. °Activity °· Return to your normal activities as told by your health care provider. Ask your health care provider what activities are safe for you. °· Do not lift anything that is heavier than 10 lb (4.5 kg), or the limit that you are told, until your health care provider says that it is safe. °· Do not do activities that involve lifting your arms over your head. °General instructions °· Do not use any products that contain nicotine or tobacco, such as cigarettes and e-cigarettes. These can delay healing. If you need help quitting, ask your health care provider. °· Keep all follow-up visits as told by your health care provider. This is important. °Contact a health care provider if: °· You have more redness, swelling, or pain around your incision. °· You have more fluid or blood coming from your incision. °· Your incision feels warm to the touch. °· You have pus or a bad smell coming from your incision. °· You have pain that is not relieved by your pain medicine. °Get help right away if you have: °· A fever or chills. °· Chest pain. °· Difficulty breathing. °Summary °· After the procedure, it is common to have pain, soreness, swelling, or bruising near your incision. °· If you were prescribed an antibiotic medicine, take it as told by your health care provider. Do not stop taking the antibiotic even if you start to feel better. °· Do not drive for 24 hours   if you were given a sedative during your procedure. °· Return to your normal activities as told by your health care provider. Ask your health care provider what activities are safe for you. °This information is not intended to replace advice given to you by your health care provider. Make sure you discuss any questions you have with your health care provider. °Document Released: 05/19/2015 Document Revised: 07/21/2017 Document Reviewed: 07/21/2017 °Elsevier Patient Education © 2020 [employer] ° ° ° °Moderate  Conscious Sedation, Adult, Care After °These instructions provide you with information about caring for yourself after your procedure. Your health care provider may also give you more specific instructions. Your treatment has been planned according to current medical practices, but problems sometimes occur. Call your health care provider if you have any problems or questions after your procedure. °What can I expect after the procedure? °After your procedure, it is common: °· To feel sleepy for several hours. °· To feel clumsy and have poor balance for several hours. °· To have poor judgment for several hours. °· To vomit if you eat too soon. °Follow these instructions at home: °For at least 24 hours after the procedure: ° °· Do not: °? Participate in activities where you could fall or become injured. °? Drive. °? Use heavy machinery. °? Drink alcohol. °? Take sleeping pills or medicines that cause drowsiness. °? Make important decisions or sign legal documents. °? Take care of children on your own. °· Rest. °Eating and drinking °· Follow the diet recommended by your health care provider. °· If you vomit: °? Drink water, juice, or soup when you can drink without vomiting. °? Make sure you have little or no nausea before eating solid foods. °General instructions °· Have a responsible adult stay with you until you are awake and alert. °· Take over-the-counter and prescription medicines only as told by your health care provider. °· If you smoke, do not smoke without supervision. °· Keep all follow-up visits as told by your health care provider. This is important. °Contact a health care provider if: °· You keep feeling nauseous or you keep vomiting. °· You feel light-headed. °· You develop a rash. °· You have a fever. °Get help right away if: °· You have trouble breathing. °This information is not intended to replace advice given to you by your health care provider. Make sure you discuss any questions you have with your  health care provider. °Document Released: 03/28/2013 Document Revised: 05/20/2017 Document Reviewed: 09/27/2015 °Elsevier Patient Education © 2020 [employer] ° °

## 2019-05-04 ENCOUNTER — Other Ambulatory Visit: Payer: Self-pay | Admitting: *Deleted

## 2019-05-04 DIAGNOSIS — Z20828 Contact with and (suspected) exposure to other viral communicable diseases: Secondary | ICD-10-CM | POA: Diagnosis not present

## 2019-05-04 DIAGNOSIS — Z20822 Contact with and (suspected) exposure to covid-19: Secondary | ICD-10-CM

## 2019-05-07 LAB — NOVEL CORONAVIRUS, NAA: SARS-CoV-2, NAA: DETECTED — AB

## 2019-05-09 DIAGNOSIS — E119 Type 2 diabetes mellitus without complications: Secondary | ICD-10-CM | POA: Diagnosis not present

## 2019-05-14 ENCOUNTER — Other Ambulatory Visit: Payer: Self-pay

## 2019-05-14 NOTE — Patient Outreach (Signed)
[facility]) Care Management  05/14/2019  Gloria Mccoy [birth year] 73 [id number]  Initial Assessment Telephone Screen  Referral Date: 05/11/2019 Referral Source: eye screening completed on 05/09/2019 at [facility] Referral Reason: "ongoing disease mgmt" Insurance: [employer]  Outreach attempt #1 to patient. Spoke with patient who denies any acute issues or concerns at present. She live with supportive spouse and daughter. She stets that she is independent with ADLs/IADLs. She denies any falls and does not use assistive devices at present.  Conditions: Per chart review, patient has PMH that includes but not limited to CAD s/p CABG, Breast CA and DM. Patient denies having Diabetes and reports he was told she "does not have it." She does not monitor cbgs in the home. Last A1C was 6.3(Oct 2020). Patient tate she was diagnosed with Breast CA earlier this year. She just finished undergoing chemo and radiation treatments a few weeks ago. She voices that treatments did make her sick some but her symptoms have resolved. Patient tested positive for COVID-19 on 05/04/2019. She is unaware as to how she caught virus as she states she has not been going out and interacting with other people. She denies any symptoms. She voices that she has not had any COVID symptoms. She did not have any prior to testing and only got tested as a precaution and not because she was experiencing symptoms. She states that she has been quarantining in the home away from her spouse and daughter. However, she did admit to leaving the home to go get eye screening exam last week. RN CM reviewed COVID-19 guidelines and safety precautions. Reinforced to patient importance of strictly quarantining herself for the entire time period per CDC guidelines.She voices that her spouse and daughter both got tested and were negative.  Medications: Med review completed with pt. She denies any issues managing and/or  affording meds at this time.   Current Medications:  Current Outpatient Medications  Medication Sig Dispense Refill  . acetaminophen (TYLENOL) 325 MG tablet Take 2 tablets (650 mg total) by mouth every 6 (six) hours as needed for mild pain.    Marland Kitchen aspirin EC 81 MG tablet Take 81 mg by mouth daily.    . cetaphil (CETAPHIL) lotion Apply 1 application topically as needed for dry skin. 236 mL 0  . Cholecalciferol (VITAMIN D PO) Take 1 tablet by mouth daily.     . Cyanocobalamin (VITAMIN B-12 PO) Take 1 tablet by mouth daily.    . fish oil-omega-3 fatty acids 1000 MG capsule Take 1 g by mouth daily.    . furosemide (LASIX) 40 MG tablet Take 1 tablet (40 mg total) by mouth daily. 90 tablet 3  . HYDROcodone-acetaminophen (NORCO/VICODIN) 5-325 MG tablet Take 1-2 tablets by mouth every 6 (six) hours as needed for moderate pain or severe pain. 60 tablet 0  . levothyroxine (SYNTHROID) 25 MCG tablet     . levothyroxine (SYNTHROID, LEVOTHROID) 75 MCG tablet Take 75 mcg by mouth daily.    Marland Kitchen lidocaine-prilocaine (EMLA) cream Apply 1 application topically as needed. 30 g 0  . losartan (COZAAR) 25 MG tablet Take 0.5 tablets (12.5 mg total) by mouth daily. 45 tablet 3  . metoprolol tartrate (LOPRESSOR) 25 MG tablet Take 1 tablet (25 mg total) by mouth 2 (two) times daily. 180 tablet 3  . mineral oil-hydrophilic petrolatum (AQUAPHOR) ointment Apply topically as needed for dry skin. 420 g 0  . vitamin E 100 UNIT capsule Take 1 capsule (100 Units total)  by mouth daily. 30 capsule 0  . dexamethasone (DECADRON) 4 MG tablet Take 1 tablet (4 mg total) by mouth daily. Start the day after chemotherapy for 2 days. Take with food. (Patient not taking: Reported on 05/14/2019) 30 tablet 1  . meloxicam (MOBIC) 15 MG tablet Take 15 mg by mouth daily.    Marland Kitchen tobramycin-dexamethasone (TOBRADEX) ophthalmic solution Place 2 drops into the right eye every 4 (four) hours while awake. (Patient not taking: Reported on 04/05/2019) 5 mL 0    No current facility-administered medications for this visit.     Functional Status:  In your present state of health, do you have any difficulty performing the following activities: 05/14/2019 04/26/2019  Hearing? N N  Vision? N N  Difficulty concentrating or making decisions? N N  Walking or climbing stairs? N N  Dressing or bathing? N N  Doing errands, shopping? N -  Preparing Food and eating ? N -  Using the Toilet? N -  In the past six months, have you accidently leaked urine? N -  Do you have problems with loss of bowel control? N -  Managing your Medications? N -  Managing your Finances? N -  Housekeeping or managing your Housekeeping? N -  Some recent data might be hidden    Fall/Depression Screening: Fall Risk  05/14/2019 09/05/2017  Falls in the past year? 0 No  Number falls in past yr: 0 -  Injury with Fall? 0 -  Risk for fall due to : Impaired vision;Medication side effect (No Data)  Risk for fall due to: Comment - No History  Follow up Falls evaluation completed;Education provided -   PHQ 2/9 Scores 05/14/2019 12/08/2017 09/05/2017  PHQ - 2 Score 0 0 0  PHQ- 9 Score - 0 4  Exception Documentation - - (No Data)    Assessment:  THN CM Care Plan Problem One     Most Recent Value  Care Plan Problem One  Patient positive for COVID-19.  Role Documenting the Problem One  Care Management Telephonic [place] for Problem One  Active  [facility] Long Term Goal   Patient will have no hospital admission related to COVID-19 over the next 31 days.  THN Long Term Goal Start Date  05/14/19  Interventions for Problem One Long Term Goal  RN CM assessed for acute sxs/issues and concerns. RN CM reviewed with pt s/s of worsening condition and when to seek medical attention.  RN CM confirmed pt. knows when,how and why to contact MDs for changes in condition.  THN CM Short Term Goal #1   Patient will continue to verbalize having no symptoms related to COVID-19 over the next 30  days,  THN CM Short Term Goal #1 Start Date  05/14/19  Interventions for Short Term Goal #1  RN CM reviewed with pt. s/s of worsenind condition and action plan.    THN CM Care Plan Problem Two     Most Recent Value  Care Plan Problem Two  Knowledge deficit related to disease process and mgmt of Breast CA.  Role Documenting the Problem Two  Care Management Telephonic [place] for Problem Two  Active  THN Long Term Goal  Patient will report effectiveness of CA tx therapies as evidenced by scan results over the next 90 days.  THN CM Short Term Goal #1   Patient will report no SEs from current Breast CA treatments within the next 30 days.  Consent: [facility] services reviewed and discussed with patient. Verbal consent for services given. Patient in agreement with ongoing disease mgmt/education and support.    Plan: RN CM discussed with patient next outreach within the month of December. Patient gave verbal consent and in agreement with RN CM follow up timeframe. Patient aware that they may contact RN CM sooner for any issues or concerns. RN CM will send welcome letter to patient. RN CM will send barriers letter and route encounter to PCP.  Enzo Montgomery, RN,BSN,CCM [place] Management Telephonic Care Management Coordinator Direct Phone: [phone] Toll Free: [phone] Fax: [phone]

## 2019-06-11 ENCOUNTER — Other Ambulatory Visit: Payer: Self-pay

## 2019-06-11 NOTE — Patient Outreach (Signed)
[facility]) Care Management  06/11/2019  Gloria Mccoy June 24, 1945 [id number]   Telephone Assessment   Outreach attempt #1 to patient. A female answered the phone and reported that patient was gone with her daughter shopping.     Plan: RN CM will make outreach attempt to patient within the month of January.   Enzo Montgomery, RN,BSN,CCM [place] Management Telephonic Care Management Coordinator Direct Phone: [phone] Toll Free: [phone] Fax: [phone]

## 2019-06-13 ENCOUNTER — Ambulatory Visit: Payer: Self-pay

## 2019-06-18 ENCOUNTER — Encounter: Payer: Self-pay | Admitting: *Deleted

## 2019-06-19 ENCOUNTER — Telehealth: Payer: Self-pay | Admitting: Cardiology

## 2019-06-19 NOTE — Telephone Encounter (Signed)
Confirmed with [facility] cath lab, patient has no stents placed, had CABG, lmtcb-c

## 2019-06-19 NOTE — Telephone Encounter (Signed)
Wants something stating that she had stints placed

## 2019-06-20 NOTE — Telephone Encounter (Signed)
Called pt. No answer, left message for pt to return call.  

## 2019-06-21 NOTE — Telephone Encounter (Signed)
Called pt. No answer. Left message for pt to return call.  

## 2019-06-22 DIAGNOSIS — Z9221 Personal history of antineoplastic chemotherapy: Secondary | ICD-10-CM

## 2019-06-22 DIAGNOSIS — Z923 Personal history of irradiation: Secondary | ICD-10-CM

## 2019-06-22 HISTORY — DX: Personal history of antineoplastic chemotherapy: Z92.21

## 2019-06-22 HISTORY — DX: Personal history of irradiation: Z92.3

## 2019-06-25 ENCOUNTER — Other Ambulatory Visit (HOSPITAL_COMMUNITY): Payer: Self-pay | Admitting: Internal Medicine

## 2019-06-25 DIAGNOSIS — Z1231 Encounter for screening mammogram for malignant neoplasm of breast: Secondary | ICD-10-CM

## 2019-06-26 ENCOUNTER — Telehealth: Payer: Self-pay | Admitting: *Deleted

## 2019-06-26 ENCOUNTER — Encounter: Payer: Self-pay | Admitting: Adult Health

## 2019-06-26 ENCOUNTER — Inpatient Hospital Stay: Payer: Medicare HMO | Attending: Genetic Counselor | Admitting: Adult Health

## 2019-06-26 ENCOUNTER — Other Ambulatory Visit: Payer: Self-pay

## 2019-06-26 VITALS — BP 156/55 | HR 69 | Temp 97.4°F | Resp 18 | Ht 62.0 in | Wt 207.2 lb

## 2019-06-26 DIAGNOSIS — Z171 Estrogen receptor negative status [ER-]: Secondary | ICD-10-CM

## 2019-06-26 DIAGNOSIS — R234 Changes in skin texture: Secondary | ICD-10-CM | POA: Insufficient documentation

## 2019-06-26 DIAGNOSIS — Z923 Personal history of irradiation: Secondary | ICD-10-CM | POA: Diagnosis not present

## 2019-06-26 DIAGNOSIS — E039 Hypothyroidism, unspecified: Secondary | ICD-10-CM | POA: Diagnosis not present

## 2019-06-26 DIAGNOSIS — Z803 Family history of malignant neoplasm of breast: Secondary | ICD-10-CM | POA: Diagnosis not present

## 2019-06-26 DIAGNOSIS — I10 Essential (primary) hypertension: Secondary | ICD-10-CM | POA: Diagnosis not present

## 2019-06-26 DIAGNOSIS — R609 Edema, unspecified: Secondary | ICD-10-CM | POA: Insufficient documentation

## 2019-06-26 DIAGNOSIS — Z8249 Family history of ischemic heart disease and other diseases of the circulatory system: Secondary | ICD-10-CM | POA: Diagnosis not present

## 2019-06-26 DIAGNOSIS — Z79899 Other long term (current) drug therapy: Secondary | ICD-10-CM | POA: Insufficient documentation

## 2019-06-26 DIAGNOSIS — Z833 Family history of diabetes mellitus: Secondary | ICD-10-CM | POA: Diagnosis not present

## 2019-06-26 DIAGNOSIS — Z823 Family history of stroke: Secondary | ICD-10-CM | POA: Diagnosis not present

## 2019-06-26 DIAGNOSIS — Z800 Family history of malignant neoplasm of digestive organs: Secondary | ICD-10-CM | POA: Insufficient documentation

## 2019-06-26 DIAGNOSIS — M1990 Unspecified osteoarthritis, unspecified site: Secondary | ICD-10-CM | POA: Diagnosis not present

## 2019-06-26 DIAGNOSIS — Z7289 Other problems related to lifestyle: Secondary | ICD-10-CM | POA: Diagnosis not present

## 2019-06-26 DIAGNOSIS — C50312 Malignant neoplasm of lower-inner quadrant of left female breast: Secondary | ICD-10-CM | POA: Diagnosis not present

## 2019-06-26 DIAGNOSIS — C50512 Malignant neoplasm of lower-outer quadrant of left female breast: Secondary | ICD-10-CM

## 2019-06-26 DIAGNOSIS — Z882 Allergy status to sulfonamides status: Secondary | ICD-10-CM | POA: Diagnosis not present

## 2019-06-26 DIAGNOSIS — I252 Old myocardial infarction: Secondary | ICD-10-CM | POA: Diagnosis not present

## 2019-06-26 MED ORDER — CETAPHIL MOISTURIZING EX LOTN: 1.0000 "application " | TOPICAL_LOTION | CUTANEOUS | 0 refills | Status: DC | PRN

## 2019-06-26 MED ORDER — DOXYCYCLINE HYCLATE 100 MG PO TABS: 100.0000 mg | ORAL_TABLET | Freq: Two times a day (BID) | ORAL | 0 refills | Status: DC

## 2019-06-26 MED ORDER — AQUAPHOR EX OINT: TOPICAL_OINTMENT | CUTANEOUS | 0 refills | Status: DC | PRN

## 2019-06-26 MED FILL — DOXYCYCLINE HYCLATE 100 MG: 100 | 10 days supply | Qty: 20 | Fill #0

## 2019-06-26 NOTE — Progress Notes (Signed)
[facility]  [phone] Fax:([phone]) [phone]    ID: Gloria Mccoy DOB: 05/06/1946  MR#: [id number]  FEO#:[id number]  Patient Care Team: Redmond School, MD as PCP - General (Internal Medicine) Harl Bowie, Alphonse Guild, MD as PCP - Cardiology (Cardiology) Magrinat, Virgie Dad, MD as Consulting Physician (Oncology) Kyung Rudd, MD as Consulting Physician (Radiation Oncology) Rutherford Guys, MD as Consulting Physician (Ophthalmology) Jovita Kussmaul, MD as Consulting Physician (General Surgery) Mauro Kaufmann, RN as Oncology Nurse Navigator Rockwell Germany, RN as Oncology Nurse Navigator Florance, Tomasa Blase, RN as [place] Management OTHER MD:    CHIEF COMPLAINT: Triple negative breast cancer  CURRENT TREATMENT: observation   INTERVAL HISTORY: Ima has h/o triple negative breast cancer and has recently completed chemotherapy and adjuvant radiation therapy.  She is here today for evaluation of left breast erythema, warmth, swelling, and pain.  She notes that this is new.    She completed her adjuvant radiation therapy on 01/19/2019.  She notes that this breast change started about 3 weeks ago and has been slowly worsening.  She was urged by her daughter to come in and make an appointment today.    REVIEW OF SYSTEMS: Asal denies fevers or chills.  She has no cough, shortness of breath, chest pain, or palpitations.  She is without nausea, vomiting, bowel/bladder changes, headaches, or any other concerns.  A detailed ROS was otherwise non contributory.    HISTORY OF CURRENT ILLNESS: From the original intake note:  Gloria Mccoy had routine screening mammography on 07/12/2018 showing a possible abnormality in the left breast. She underwent unilateral left diagnostic mammography with tomography and left breast ultrasonography at [facility] on 07/25/2018 showing: Breast Density Category B. There is a mass with indistinct margins  in the lower inner posterior left breast measuring approximately 0.6 cm. Targeted ultrasound of the lower left breast was performed. There is a hypoechoic mass with margin irregularity in the left breast at 7 o'clock, 7 cm from the nipple measuring 0.7 x 0.5 x 0.6 cm. No lymphadenopathy seen in the left axilla.   Accordingly on 08/01/2018 she proceeded to biopsy of the left breast area in question. The pathology from this procedure showed ([id number]): invasive ductal carcinoma, grade II. Prognostic indicators significant for: estrogen receptor, 0% negative and progesterone receptor, 0% negative. Proliferation marker Ki67 at 40%. HER2 negative (1+) by immunohistochemistry.   The patient's subsequent history is as detailed below.   PAST MEDICAL HISTORY: Past Medical History:  Diagnosis Date  . Arthritis    "hands sometimes" (07/13/2017)  . Coronary artery disease    a. s/p CABG x3 in 06/2017 with LIMA-LAD, SVG-D1, and SVG-RI.  b. 10/2017: cath showing a widely patent LIMA-LAD with ostial occlusion of the SVG-RI and subtotally occluded atretic SVG-D1. Graft occlusion thought to be 2ry to improvement in pre-CABG stenoses.   . Essential hypertension   . Family history of adverse reaction to anesthesia    sister had a complication that was stated she had a "foggy" episode after her breast surgery was readmitted 1 day post op after being discharged from the hospital. Sister also has significant lung problems that could have contributed to this   . Family history of breast cancer   . Family history of colon cancer   . Hypothyroid   . Meniere disease   . Myocardial infarction ([place]) 06/2017   during cardaic rehab  . Obesity   . Osteopenia 01/2012   T score -  1.3 FRAX 7.9%/0.6%     PAST SURGICAL HISTORY: Past Surgical History:  Procedure Laterality Date  . BREAST LUMPECTOMY WITH RADIOACTIVE SEED AND SENTINEL LYMPH NODE BIOPSY Left 09/01/2018   Procedure: LEFT BREAST LUMPECTOMY WITH RADIOACTIVE  SEED AND SENTINEL LYMPH NODE BIOPSY;  Surgeon: Jovita Kussmaul, MD;  Location: [facility];  Service: General;  Laterality: Left;  . BREAST SURGERY    . CARDIAC CATHETERIZATION  07/13/2017  . CATARACT EXTRACTION W/PHACO Right 01/28/2015   Procedure: CATARACT EXTRACTION PHACO AND INTRAOCULAR LENS PLACEMENT :  CDE:  5.70;  Surgeon: Rutherford Guys, MD;  Location: [facility];  Service: Ophthalmology;  Laterality: Right;  . CATARACT EXTRACTION W/PHACO Left 02/11/2015   Procedure: CATARACT EXTRACTION PHACO AND INTRAOCULAR LENS PLACEMENT (IOC);  Surgeon: Rutherford Guys, MD;  Location: [facility];  Service: Ophthalmology;  Laterality: Left;  CDE: 7.38  . COLONOSCOPY N/A 09/26/2012   Procedure: COLONOSCOPY;  Surgeon: Jamesetta So, MD;  [facility];  Service: Gastroenterology;  Laterality: N/A;  . CORONARY ARTERY BYPASS GRAFT N/A 07/15/2017   Procedure: CORONARY ARTERY BYPASS GRAFTING (CABG) X 3 USING LEFT INTERNAL MAMMARY ARTERY AND RIGHT SAPHENOUS VEIN- ENDOSCOPICALLY HARVESTED;  Surgeon: Melrose Nakayama, MD;  Location: [facility];  Service: Open Heart Surgery;  Laterality: N/A;  . IR IMAGING GUIDED PORT INSERTION  12/29/2018  . IR REMOVAL TUN ACCESS W/ PORT W/O FL MOD SED  04/26/2019  . LEFT HEART CATH AND CORONARY ANGIOGRAPHY N/A 07/13/2017   Procedure: LEFT HEART CATH AND CORONARY ANGIOGRAPHY;  Surgeon: Martinique, Peter M, MD;  Location: [facility];  Service: Cardiovascular;  Laterality: N/A;  . LEFT HEART CATH AND CORS/GRAFTS ANGIOGRAPHY N/A 11/03/2017   Procedure: LEFT HEART CATH AND CORS/GRAFTS ANGIOGRAPHY;  Surgeon: Troy Sine, MD;  Location: [facility];  Service: Cardiovascular;  Laterality: N/A;  . TEE WITHOUT CARDIOVERSION N/A 07/15/2017   Procedure: TRANSESOPHAGEAL ECHOCARDIOGRAM (TEE);  Surgeon: Melrose Nakayama, MD;  Location: [facility];  Service: Open Heart Surgery;  Laterality: N/A;  . TUBAL LIGATION    . TYMPANOPLASTY Left    fluid from ear drum     FAMILY HISTORY: Family  History  Problem Relation Age of Onset  . Diabetes Sister        AODM  . Heart disease Sister 3       CABG  . Breast cancer Sister   . Heart disease Brother 66       In his 58s  . Colon cancer Maternal Uncle 51  . Diabetes Sister        AODM  . Hypertension Daughter   . Hypertension Daughter   . Breast cancer Cousin        PATERNAL COUSIN  . Heart attack Father        In his 48s  . Stroke Mother   . Stroke Maternal Grandmother   . Diabetes Maternal Grandfather        d. 109  . Heart attack Paternal Grandmother   . Colon cancer Maternal Uncle 69  . Breast cancer Cousin        dx in her 77s; mat first cousin  . Breast cancer Cousin        dx 2s; d. 57s  . Breast cancer Niece 35   Kazandra's father died from a myocardial infarction at age 58. Patients' mother died from a stroke at age 41. The patient has 3 brothers and 9 sisters. One of Taliana's sisters, [name], was diagnosed  with breast cancer at the age of 36. Franki also has a niece that was diagnosed with breast cancer at 64, and two cousins that were diagnosed with breast cancer.  She believes 1 of her brothers may have prostate cancer.  Patient denies anyone in her family having ovarian or pancreatic cancer. Dakisha has an uncle that was diagnosed with colon cancer and an uncle that was diagnosed with "stomach" cancer.    GYNECOLOGIC HISTORY:  No LMP recorded. Patient is postmenopausal. 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 Her husband, Jason Fila, is a retired Administrator. They have two daughters. Their daughter, [name], lives in [place] and is a housewife.  Their other daughter works at [employer]. Lashanti had one grandchild that is now deceased. She attends a [employer].    ADVANCED DIRECTIVES: Keileigh's husband, Jason Fila, is  automatically her healthcare power of attorney.     HEALTH MAINTENANCE: Social History   Tobacco Use  . Smoking status: Never Smoker  . Smokeless tobacco: Former Systems developer    Types: Snuff  Substance Use Topics  . Alcohol use: Yes    Alcohol/week: 1.0 standard drinks    Types: 1 Standard drinks or equivalent per week    Comment: occasionally  . Drug use: No    Colonoscopy: yes, Dr. Arnoldo Morale  PAP:   Bone density: yes, 2016, -1.1 osteopenic   Allergies  Allergen Reactions  . Aloe Vera Itching    Per patient severe itching  . Sulfa Antibiotics Shortness Of Breath  . Sulfasalazine Shortness Of Breath    Current Outpatient Medications  Medication Sig Dispense Refill  . acetaminophen (TYLENOL) 325 MG tablet Take 2 tablets (650 mg total) by mouth every 6 (six) hours as needed for mild pain.    Marland Kitchen aspirin EC 81 MG tablet Take 81 mg by mouth daily.    . cetaphil (CETAPHIL) lotion Apply 1 application topically as needed for dry skin. 236 mL 0  . Cholecalciferol (VITAMIN D PO) Take 1 tablet by mouth daily.     . Cyanocobalamin (VITAMIN B-12 PO) Take 1 tablet by mouth daily.    Marland Kitchen dexamethasone (DECADRON) 4 MG tablet Take 1 tablet (4 mg total) by mouth daily. Start the day after chemotherapy for 2 days. Take with food. 30 tablet 1  . fish oil-omega-3 fatty acids 1000 MG capsule Take 1 g by mouth daily.    . furosemide (LASIX) 40 MG tablet Take 1 tablet (40 mg total) by mouth daily. 90 tablet 3  . HYDROcodone-acetaminophen (NORCO/VICODIN) 5-325 MG tablet Take 1-2 tablets by mouth every 6 (six) hours as needed for moderate pain or severe pain. 60 tablet 0  . levothyroxine (SYNTHROID) 25 MCG tablet     . levothyroxine (SYNTHROID, LEVOTHROID) 75 MCG tablet Take 75 mcg by mouth daily.    Marland Kitchen lidocaine-prilocaine (EMLA) cream Apply 1 application topically as needed. 30 g 0  . meloxicam (MOBIC) 15 MG tablet Take 15 mg by mouth daily.    . metoprolol tartrate (LOPRESSOR) 25 MG tablet Take 1 tablet (25  mg total) by mouth 2 (two) times daily. 180 tablet 3  . mineral oil-hydrophilic petrolatum (AQUAPHOR) ointment Apply topically as needed for dry skin. 420 g 0  . tobramycin-dexamethasone (TOBRADEX) ophthalmic solution Place 2 drops into the right eye  every 4 (four) hours while awake. 5 mL 0  . vitamin E 100 UNIT capsule Take 1 capsule (100 Units total) by mouth daily. 30 capsule 0  . losartan (COZAAR) 25 MG tablet Take 0.5 tablets (12.5 mg total) by mouth daily. 45 tablet 3   No current facility-administered medications for this visit.     OBJECTIVE:  Vitals:   06/26/19 1530 06/26/19 1537  BP: (!) 176/60 (!) 156/55  Pulse: 74 69  Resp: 19 18  Temp: (!) 97.4 F (36.3 C)   SpO2: 90% 95%     Body mass index is 37.9 kg/m.   Wt Readings from Last 3 Encounters:  06/26/19 207 lb 3.2 oz (94 kg)  04/26/19 191 lb (86.6 kg)  04/05/19 203 lb 11.2 oz (92.4 kg)  ECOG FS:1 - Symptomatic but completely ambulatory GENERAL: Patient is a well appearing female in no acute distress HEENT:  Sclerae anicteric.   Oropharynx clear and moist. No ulcerations or evidence of oropharyngeal candidiasis. Neck is supple.  NODES:  No cervical, supraclavicular, or axillary lymphadenopathy palpated.  BREAST EXAM:  Right breast benign, left breast s/p lumpectomy and radiation.  Left breast is swollen throughout, but warm and tender in left upper outer quadrant.  Slight erythema noted.  No sign of abscess or fluid accumulation. LUNGS:  Clear to auscultation bilaterally.  No wheezes or rhonchi. HEART:  Regular rate and rhythm. No murmur appreciated. ABDOMEN:  Soft, nontender.  Positive, normoactive bowel sounds. No organomegaly palpated. MSK:  No focal spinal tenderness to palpation. Full range of motion bilaterally in the upper extremities. EXTREMITIES:  Scant bilateral lower extremity edema SKIN:  Clear with no obvious rashes or skin changes. No nail dyscrasia. NEURO:  Nonfocal. Well oriented.  Appropriate  affect.     LAB RESULTS:  CMP     Component Value Date/Time   NA 141 04/26/2019 1338   K 3.6 04/26/2019 1338   CL 107 04/26/2019 1338   CO2 25 04/26/2019 1338   GLUCOSE 96 04/26/2019 1338   BUN 11 04/26/2019 1338   CREATININE 0.85 04/26/2019 1338   CREATININE 0.78 01/16/2019 0840   CALCIUM 9.0 04/26/2019 1338   PROT 6.2 (L) 04/05/2019 1307   ALBUMIN 3.3 (L) 04/05/2019 1307   AST 19 04/05/2019 1307   AST 22 01/16/2019 0840   ALT 12 04/05/2019 1307   ALT 10 01/16/2019 0840   ALKPHOS 72 04/05/2019 1307   BILITOT 0.4 04/05/2019 1307   BILITOT 0.7 01/16/2019 0840   GFRNONAA >60 04/26/2019 1338   GFRNONAA >60 01/16/2019 0840   GFRAA >60 04/26/2019 1338   GFRAA >60 01/16/2019 0840    No results found for: TOTALPROTELP, ALBUMINELP, A1GS, A2GS, BETS, BETA2SER, GAMS, MSPIKE, SPEI  No results found for: KPAFRELGTCHN, LAMBDASER, KAPLAMBRATIO  Lab Results  Component Value Date   WBC 7.6 04/26/2019   NEUTROABS 3.7 04/26/2019   HGB 12.9 04/26/2019   HCT 37.8 04/26/2019   MCV 86.1 04/26/2019   PLT 268 04/26/2019    _0 @  No results found for: LABCA2  No components found for: [id number]  No results for input(s): INR in the last 168 hours.  No results found for: LABCA2  No results found for: KZL935  No results found for: TSV779  No results found for: TJQ300  No results found for: CA2729  No components found for: HGQUANT  No results found for: CEA1 / No results found for: CEA1   No results found for: AFPTUMOR  No results found for: [facility]  No results found for: PSA1  No visits with results within 3 Day(s) from this visit.  Latest known visit with results is:  Orders Only on 05/04/2019  Component Date Value Ref Range Status  . SARS-CoV-2, NAA 05/04/2019 Detected* Not Detected Final   Comment: This nucleic acid amplification test was developed and its performance characteristics determined by [employer] and [employer]. Nucleic acid amplification  tests include PCR and TMA. This test has not been FDA cleared or approved. This test has been authorized by FDA under an Emergency Use Authorization (EUA). This test is only authorized for the duration of time the declaration that circumstances exist justifying the authorization of the emergency use of in vitro diagnostic tests for detection of SARS-CoV-2 virus and/or diagnosis of COVID-19 infection under section 564(b)(1) of the Act, 21 U.S.C. 102HEN-2(D) (1), unless the authorization is terminated or revoked sooner. When diagnostic testing is negative, the possibility of a false negative result should be considered in the context of a patient's recent exposures and the presence of clinical signs and symptoms consistent with COVID-19. An individual without symptoms of COVID-19 and who is not shedding SARS-CoV-2 virus would                           expect to have a negative (not detected) result in this assay.     (this displays the last labs from the last 3 days)  No results found for: TOTALPROTELP, ALBUMINELP, A1GS, A2GS, BETS, BETA2SER, GAMS, MSPIKE, SPEI (this displays SPEP labs)  No results found for: KPAFRELGTCHN, LAMBDASER, KAPLAMBRATIO (kappa/lambda light chains)  No results found for: HGBA, HGBA2QUANT, HGBFQUANT, HGBSQUAN (Hemoglobinopathy evaluation)   No results found for: LDH  No results found for: IRON, TIBC, IRONPCTSAT (Iron and TIBC)  No results found for: FERRITIN  Urinalysis    Component Value Date/Time   COLORURINE YELLOW 07/14/2017 2015   APPEARANCEUR HAZY (A) 07/14/2017 2015   LABSPEC 1.011 07/14/2017 2015   PHURINE 6.0 07/14/2017 2015   GLUCOSEU NEGATIVE 07/14/2017 2015   HGBUR NEGATIVE 07/14/2017 2015   BILIRUBINUR NEGATIVE 07/14/2017 2015   [place] 07/14/2017 2015   PROTEINUR NEGATIVE 07/14/2017 2015   UROBILINOGEN 0.2 07/03/2014 0852   NITRITE NEGATIVE 07/14/2017 2015   LEUKOCYTESUR LARGE (A) 07/14/2017 2015     STUDIES:  No  results found.   ELIGIBLE FOR AVAILABLE RESEARCH PROTOCOL: no   ASSESSMENT: 74 y.o. [place], [place] woman status post left breast lower inner quadrant biopsy 08/01/2018 for a clinical T1c N0, stage IB invasive ductal carcinoma, grade 2, triple negative, with an MIB-1 of 40%  (1) genetics testing 08/29/2018 through with Hereditary Gene Panel offered by [facility] found no deleterious mutations in APC, ATM, AXIN2, BARD1, BMPR1A, BRCA1, BRCA2, BRIP1, CDH1, CDK4, CDKN2A (p14ARF), CDKN2A (p16INK4a), CHEK2, CTNNA1, DICER1, EPCAM (Deletion/duplication testing only), GREM1 (promoter region deletion/duplication testing only), KIT, MEN1, MLH1, MSH2, MSH3, MSH6, MUTYH, NBN, NF1, NHTL1, PALB2, PDGFRA, PMS2, POLD1, POLE, PTEN, RAD50, RAD51C, RAD51D, SDHB, SDHC, SDHD, SMAD4, SMARCA4. STK11, TP53, TSC1, TSC2, and VHL.  The following genes were evaluated for sequence changes only: SDHA and HOXB13 c.251G>A variant only.   (a) MLH1 c.1890T>G VUS identified  (2) status post left lumpectomy with sentinel lymph node sampling 09/01/2022 a pT1c pN0, stage IB invasive ductal carcinoma, grade 2, with negative margins.  (a) a total of 3 sentinel lymph nodes were removed  (3) chemotherapy consisting of cyclophosphamide, methotrexate, and fluorouracil (CMF), repeated every 21 days x 8, from 10/24/2018-04/05/2019  (  4) adjuvant radiation given concurrently with cycles 3-4-5 of chemotherapy from 12/05/2018-01/19/2019.  The left breast was treated to 50.4 Gy in 28 fractions, followed by a 10 Gy boost to the lumpectomy cavity.   PLAN: Nilsa is here for evaluation of her new left breast abnormality.  I reviewed with her there are three things that could be going on.    1.  She likely has a breast cellulitis.  I placed a prescription for Doxycycline 130m PO BID x 10 days for her to take.    2.  She has a recent h/o triple negative breast cancer.  Due to the breast changes, I have ordered a diagnostic mammogram and ultrasound of  the left breast to be completed at the breast center.    3.  She may need some PT of the breast if she continues to have generalized swelling of her left breast.  We will await her mammogram that will take place next week.     A total of (20) minutes of face-to-face time was spent with this patient with greater than 50% of that time in counseling and care-coordination.   LWilber Bihari NP  06/26/19 3:40 PM Medical Oncology and Hematology [facility]AWilliam[address] [place] [address]Tel. [phone]   Fax. [phone]

## 2019-06-26 NOTE — Telephone Encounter (Signed)
This RN spoke with pt per her call stating her breast is hard and tender.   She states she started noticing this about 2 weeks ago " but I thought it was normal but now it is getting more sore "  She states area is on the nipple and outside of left breast.  Breast is tender to touch and darker then other breast ( but was like this post radiation) Breast is dry to touch.  This RN requested pt to come in today for assessment of area for possible cellulitis.  Appointment placed on [facility]/NP schedule per Symptom Management not available per provider on Pal.

## 2019-07-03 ENCOUNTER — Other Ambulatory Visit: Payer: Self-pay | Admitting: Adult Health

## 2019-07-03 DIAGNOSIS — C50512 Malignant neoplasm of lower-outer quadrant of left female breast: Secondary | ICD-10-CM

## 2019-07-06 ENCOUNTER — Other Ambulatory Visit: Payer: Self-pay

## 2019-07-06 NOTE — Patient Outreach (Signed)
[facility]) Care Management  07/06/2019  Gloria Mccoy [birth year] [id number]   Telephone Assessment   Outreach attempt#1 to patient. No answer. RN CM left HIPAA compliant voicemail message along with contact info.     Plan: RN CM will make outreach attempt to patient within the month of February if no return call from patient.   Enzo Montgomery, RN,BSN,CCM [facility] Management Telephonic Care Management Coordinator Direct Phone: [phone] Toll Free: [phone] Fax: [phone]

## 2019-07-09 ENCOUNTER — Ambulatory Visit: Payer: Self-pay

## 2019-07-10 ENCOUNTER — Ambulatory Visit (HOSPITAL_COMMUNITY)
Admission: RE | Admit: 2019-07-10 | Discharge: 2019-07-10 | Disposition: A | Discharge status: Home / Self Care | Payer: Medicare HMO | Source: Ambulatory Visit | Attending: Adult Health | Admitting: Adult Health

## 2019-07-10 ENCOUNTER — Ambulatory Visit (HOSPITAL_COMMUNITY): Payer: Medicare HMO

## 2019-07-10 ENCOUNTER — Other Ambulatory Visit: Payer: Self-pay

## 2019-07-10 ENCOUNTER — Ambulatory Visit (HOSPITAL_COMMUNITY): Admission: RE | Admit: 2019-07-10 | Payer: Medicare HMO | Source: Ambulatory Visit

## 2019-07-10 DIAGNOSIS — Z171 Estrogen receptor negative status [ER-]: Secondary | ICD-10-CM | POA: Diagnosis not present

## 2019-07-10 DIAGNOSIS — C50512 Malignant neoplasm of lower-outer quadrant of left female breast: Secondary | ICD-10-CM

## 2019-07-10 DIAGNOSIS — R928 Other abnormal and inconclusive findings on diagnostic imaging of breast: Secondary | ICD-10-CM | POA: Diagnosis not present

## 2019-07-16 ENCOUNTER — Ambulatory Visit (HOSPITAL_COMMUNITY): Payer: Medicare HMO

## 2019-07-17 ENCOUNTER — Inpatient Hospital Stay: Payer: Medicare HMO | Admitting: Adult Health

## 2019-07-17 ENCOUNTER — Other Ambulatory Visit: Payer: Self-pay

## 2019-07-17 ENCOUNTER — Encounter: Payer: Self-pay | Admitting: Adult Health

## 2019-07-17 VITALS — BP 163/63 | HR 80 | Temp 98.2°F | Resp 18 | Ht 62.0 in | Wt 207.6 lb

## 2019-07-17 DIAGNOSIS — C50512 Malignant neoplasm of lower-outer quadrant of left female breast: Secondary | ICD-10-CM | POA: Diagnosis not present

## 2019-07-17 DIAGNOSIS — R234 Changes in skin texture: Secondary | ICD-10-CM | POA: Diagnosis not present

## 2019-07-17 DIAGNOSIS — R609 Edema, unspecified: Secondary | ICD-10-CM | POA: Diagnosis not present

## 2019-07-17 DIAGNOSIS — Z7289 Other problems related to lifestyle: Secondary | ICD-10-CM | POA: Diagnosis not present

## 2019-07-17 DIAGNOSIS — C50312 Malignant neoplasm of lower-inner quadrant of left female breast: Secondary | ICD-10-CM | POA: Diagnosis not present

## 2019-07-17 DIAGNOSIS — M1990 Unspecified osteoarthritis, unspecified site: Secondary | ICD-10-CM | POA: Diagnosis not present

## 2019-07-17 DIAGNOSIS — I252 Old myocardial infarction: Secondary | ICD-10-CM | POA: Diagnosis not present

## 2019-07-17 DIAGNOSIS — Z171 Estrogen receptor negative status [ER-]: Secondary | ICD-10-CM

## 2019-07-17 DIAGNOSIS — E039 Hypothyroidism, unspecified: Secondary | ICD-10-CM | POA: Diagnosis not present

## 2019-07-17 DIAGNOSIS — I10 Essential (primary) hypertension: Secondary | ICD-10-CM | POA: Diagnosis not present

## 2019-07-17 NOTE — Progress Notes (Signed)
SURVIVORSHIP  VISIT:    BRIEF ONCOLOGIC HISTORY:  Oncology History  Malignant neoplasm of lower-outer quadrant of left breast of female, estrogen receptor negative ([place])  08/01/2018 Initial Diagnosis   Screening mammography revealed an abnormality in the left breast. Diagnostic testing revealed a 0.7 cm mass at 7 o'clock, 7 cm from the nipple. No lymphadenopathy seen in the left axilla. Biopsy ([id number]) revealed: IDC, grade II; ER 0%, PR 0%, Ki67 40%, HER2 negative (1+). Clinical T1c N0.   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  The Hereditary Gene Panel offered by [facility] includes sequencing and/or deletion duplication testing of the following 47 genes: APC, ATM, AXIN2, BARD1, BMPR1A, BRCA1, BRCA2, BRIP1, CDH1, CDK4, CDKN2A (p14ARF), CDKN2A (p16INK4a), CHEK2, CTNNA1, DICER1, EPCAM (Deletion/duplication testing only), GREM1 (promoter region deletion/duplication testing only), KIT, MEN1, MLH1, MSH2, MSH3, MSH6, MUTYH, NBN, NF1, NHTL1, PALB2, PDGFRA, PMS2, POLD1, POLE, PTEN, RAD50, RAD51C, RAD51D, SDHB, SDHC, SDHD, SMAD4, SMARCA4. STK11, TP53, TSC1, TSC2, and VHL.  The following genes were evaluated for sequence changes only: SDHA and HOXB13 c.251G>A variant only. The report date is 08-29-2018.   08/24/2018 Breast MRI   Bilateral breast MRI with and without contrast.  Right breast: No suspicious mass or abnormal enhancement. Benign intramammary lymph nodes in the outer right breast.   Left breast: In the posterior third of the lower inner quadrant of the left breast is an irregular enhancing mass spanning approximately 1.3 x 0.8 x 0.5 cm with internal biopsy clip artifact and washout kinetics, corresponding to the   biopsy-proven malignancy. The mass/enhancement do not involve the pectoralis muscle. Benign intramammary lymph node in the middle third of the outer left breast is mammographically stable. There is more glandular tissue in the left  breast on the right, corresponding with chronic appearance of mammograms dating back to at least 2010.  Lymph nodes: No abnormal appearing lymph nodes.   09/01/2018 Surgery   Left lumpectomy Marlou Starks) [phone]): IDC, nottingham grade 2 of 3, 1.1 cm. Margins uninvolved. 0/3 lymph nodes positive for carcinoma. Pathologic stage pT1c pN0.   09/13/2018 Cancer Staging   Staging form: Breast, AJCC 8th Edition - Pathologic: Stage IB (pT1c, pN0, cM0, G2, ER-, PR-, HER2-) - Signed by Gardenia Phlegm, NP on 09/13/2018   10/24/2018 -  Chemotherapy   The patient had palonosetron (ALOXI) injection 0.25 mg, 0.25 mg, Intravenous,  Once, 8 of 8 cycles Administration: 0.25 mg (10/24/2018), 0.25 mg (11/14/2018), 0.25 mg (12/05/2018), 0.25 mg (12/26/2018), 0.25 mg (01/24/2019), 0.25 mg (02/22/2019), 0.25 mg (03/15/2019), 0.25 mg (04/05/2019) methotrexate (PF) chemo injection 80 mg, 39.8 mg/m2 = 80.5 mg, Intravenous,  Once, 5 of 5 cycles Administration: 80 mg (10/24/2018), 80 mg (11/14/2018), 80 mg (02/22/2019), 80 mg (03/15/2019), 80 mg (04/05/2019) cyclophosphamide (CYTOXAN) 1,200 mg in sodium chloride 0.9 % 250 mL chemo infusion, 600 mg/m2 = 1,200 mg, Intravenous,  Once, 8 of 8 cycles Administration: 1,200 mg (10/24/2018), 1,200 mg (11/14/2018), 1,200 mg (12/05/2018), 1,200 mg (12/26/2018), 1,200 mg (01/24/2019), 1,200 mg (02/22/2019), 1,200 mg (03/15/2019), 1,200 mg (04/05/2019) fluorouracil (ADRUCIL) chemo injection 1,200 mg, 600 mg/m2 = 1,200 mg, Intravenous,  Once, 8 of 8 cycles Administration: 1,200 mg (10/24/2018), 1,200 mg (11/14/2018), 1,200 mg (12/05/2018), 1,200 mg (12/26/2018), 1,200 mg (01/24/2019), 1,200 mg (02/22/2019), 1,200 mg (03/15/2019), 1,200 mg (04/05/2019)  for chemotherapy treatment.    12/05/2018 - 01/19/2019 Radiation Therapy   Lisbeth Renshaw) The patient initially received a dose of 50.4 Gy in 28 fractions to the breast  using whole-breast tangent fields. This was delivered using a 3-D conformal technique. The patient then  received a boost to the seroma. This delivered an additional 10 Gy in 5 fractions using a 3 field photon boost technique. The total dose was 60.4 Gy.     INTERVAL HISTORY:  Gloria Mccoy to review her survivorship care plan detailing her treatment course for breast cancer, as well as monitoring long-term side effects of that treatment, education regarding health maintenance, screening, and overall wellness and health promotion.     Overall, Gloria Mccoy reports feeling quite well.  She was seen a couple of weeks ago for breast cellulitis.  She took 2 weeks of doxycline and had f/u mammogram and ultrasound that showed no evidence of malignancy.   The erythema and swelling has improved since she completed her antibiotics.  She has mild tenderness and pain, however it is improving.    REVIEW OF SYSTEMS:  Review of Systems  Constitutional: Negative for appetite change, chills, fatigue, fever and unexpected weight change.  HENT:   Negative for hearing loss, lump/mass and trouble swallowing.   Eyes: Negative for eye problems and icterus.  Respiratory: Negative for chest tightness, cough and shortness of breath.   Cardiovascular: Negative for chest pain, leg swelling and palpitations.  Gastrointestinal: Negative for abdominal distention, abdominal pain, constipation, diarrhea, nausea and vomiting.  Endocrine: Negative for hot flashes.  Musculoskeletal: Negative for arthralgias.  Skin: Negative for itching and rash.  Neurological: Negative for dizziness, extremity weakness, headaches and numbness.  Hematological: Negative for adenopathy. Does not bruise/bleed easily.  Psychiatric/Behavioral: Negative for depression. The patient is not nervous/anxious.   Breast: Denies any new nodularity, masses, nipple changes, or nipple discharge.      ONCOLOGY TREATMENT TEAM:  1. Surgeon:  Dr. Marlou Starks at [facility] 2. Medical Oncologist: Dr. Jana Hakim  3. Radiation Oncologist: Dr. Lisbeth Renshaw    PAST  MEDICAL/SURGICAL HISTORY:  Past Medical History:  Diagnosis Date  . Arthritis    "hands sometimes" (07/13/2017)  . Coronary artery disease    a. s/p CABG x3 in 06/2017 with LIMA-LAD, SVG-D1, and SVG-RI.  b. 10/2017: cath showing a widely patent LIMA-LAD with ostial occlusion of the SVG-RI and subtotally occluded atretic SVG-D1. Graft occlusion thought to be 2ry to improvement in pre-CABG stenoses.   . Essential hypertension   . Family history of adverse reaction to anesthesia    sister had a complication that was stated she had a "foggy" episode after her breast surgery was readmitted 1 day post op after being discharged from the hospital. Sister also has significant lung problems that could have contributed to this   . Family history of breast cancer   . Family history of colon cancer   . Hypothyroid   . Meniere disease   . Myocardial infarction ([place]) 06/2017   during cardaic rehab  . Obesity   . Osteopenia 01/2012   T score -1.3 FRAX 7.9%/0.6%   Past Surgical History:  Procedure Laterality Date  . BREAST LUMPECTOMY WITH RADIOACTIVE SEED AND SENTINEL LYMPH NODE BIOPSY Left 09/01/2018   Procedure: LEFT BREAST LUMPECTOMY WITH RADIOACTIVE SEED AND SENTINEL LYMPH NODE BIOPSY;  Surgeon: Jovita Kussmaul, MD;  Location: [place];  Service: General;  Laterality: Left;  . BREAST SURGERY    . CARDIAC CATHETERIZATION  07/13/2017  . CATARACT EXTRACTION W/PHACO Right 01/28/2015   Procedure: CATARACT EXTRACTION PHACO AND INTRAOCULAR LENS PLACEMENT :  CDE:  5.70;  Surgeon: Rutherford Guys, MD;  Location: [facility];  Service: Ophthalmology;  Laterality: Right;  . CATARACT EXTRACTION W/PHACO Left 02/11/2015   Procedure: CATARACT EXTRACTION PHACO AND INTRAOCULAR LENS PLACEMENT (IOC);  Surgeon: Rutherford Guys, MD;  Location: [facility];  Service: Ophthalmology;  Laterality: Left;  CDE: 7.38  . COLONOSCOPY N/A 09/26/2012   Procedure: COLONOSCOPY;  Surgeon: Jamesetta So, MD;  [facility];  Service:  Gastroenterology;  Laterality: N/A;  . CORONARY ARTERY BYPASS GRAFT N/A 07/15/2017   Procedure: CORONARY ARTERY BYPASS GRAFTING (CABG) X 3 USING LEFT INTERNAL MAMMARY ARTERY AND RIGHT SAPHENOUS VEIN- ENDOSCOPICALLY HARVESTED;  Surgeon: Melrose Nakayama, MD;  Location: [place];  Service: Open Heart Surgery;  Laterality: N/A;  . IR IMAGING GUIDED PORT INSERTION  12/29/2018  . IR REMOVAL TUN ACCESS W/ PORT W/O FL MOD SED  04/26/2019  . LEFT HEART CATH AND CORONARY ANGIOGRAPHY N/A 07/13/2017   Procedure: LEFT HEART CATH AND CORONARY ANGIOGRAPHY;  Surgeon: Martinique, Peter M, MD;  Location: [place] [facility];  Service: Cardiovascular;  Laterality: N/A;  . LEFT HEART CATH AND CORS/GRAFTS ANGIOGRAPHY N/A 11/03/2017   Procedure: LEFT HEART CATH AND CORS/GRAFTS ANGIOGRAPHY;  Surgeon: Troy Sine, MD;  Location: [place] [facility];  Service: Cardiovascular;  Laterality: N/A;  . TEE WITHOUT CARDIOVERSION N/A 07/15/2017   Procedure: TRANSESOPHAGEAL ECHOCARDIOGRAM (TEE);  Surgeon: Melrose Nakayama, MD;  Location: [place];  Service: Open Heart Surgery;  Laterality: N/A;  . TUBAL LIGATION    . TYMPANOPLASTY Left    fluid from ear drum     ALLERGIES:  Allergies  Allergen Reactions  . Aloe Vera Itching    Per patient severe itching  . Sulfa Antibiotics Shortness Of Breath  . Sulfasalazine Shortness Of Breath     CURRENT MEDICATIONS:  Outpatient Encounter Medications as of 07/17/2019  Medication Sig Note  . acetaminophen (TYLENOL) 325 MG tablet Take 2 tablets (650 mg total) by mouth every 6 (six) hours as needed for mild pain.   Marland Kitchen aspirin EC 81 MG tablet Take 81 mg by mouth daily.   . cetaphil (CETAPHIL) lotion Apply 1 application topically as needed for dry skin.   . Cholecalciferol (VITAMIN D PO) Take 1 tablet by mouth daily.    . Cyanocobalamin (VITAMIN B-12 PO) Take 1 tablet by mouth daily.   Marland Kitchen doxycycline (VIBRA-TABS) 100 MG tablet Take 1 tablet (100 mg total) by mouth 2 (two) times daily.    . fish oil-omega-3 fatty acids 1000 MG capsule Take 1 g by mouth daily.   . furosemide (LASIX) 40 MG tablet Take 1 tablet (40 mg total) by mouth daily.   Marland Kitchen HYDROcodone-acetaminophen (NORCO/VICODIN) 5-325 MG tablet Take 1-2 tablets by mouth every 6 (six) hours as needed for moderate pain or severe pain.   Marland Kitchen levothyroxine (SYNTHROID) 25 MCG tablet    . levothyroxine (SYNTHROID, LEVOTHROID) 75 MCG tablet Take 75 mcg by mouth daily.   Marland Kitchen lidocaine-prilocaine (EMLA) cream Apply 1 application topically as needed. 12/29/2018: Has not used   . losartan (COZAAR) 25 MG tablet Take 0.5 tablets (12.5 mg total) by mouth daily.   . meloxicam (MOBIC) 15 MG tablet Take 15 mg by mouth daily. 12/29/2018: Has not taken  . metoprolol tartrate (LOPRESSOR) 25 MG tablet Take 1 tablet (25 mg total) by mouth 2 (two) times daily.   . mineral oil-hydrophilic petrolatum (AQUAPHOR) ointment Apply topically as needed for dry skin.   Marland Kitchen tobramycin-dexamethasone (TOBRADEX) ophthalmic solution Place 2 drops into the right eye every 4 (four) hours while  awake.   . vitamin E 100 UNIT capsule Take 1 capsule (100 Units total) by mouth daily.    No facility-administered encounter medications on file as of 07/17/2019.     ONCOLOGIC FAMILY HISTORY:  Family History  Problem Relation Age of Onset  . Diabetes Sister        AODM  . Heart disease Sister 61       CABG  . Breast cancer Sister   . Heart disease Brother 1       In his 35s  . Colon cancer Maternal Uncle 23  . Diabetes Sister        AODM  . Hypertension Daughter   . Hypertension Daughter   . Breast cancer Cousin        PATERNAL COUSIN  . Heart attack Father        In his 63s  . Stroke Mother   . Stroke Maternal Grandmother   . Diabetes Maternal Grandfather        d. 109  . Heart attack Paternal Grandmother   . Colon cancer Maternal Uncle 78  . Breast cancer Cousin        dx in her 1s; mat first cousin  . Breast cancer Cousin        dx 105s; d. 49s  .  Breast cancer Niece 18     GENETIC COUNSELING/TESTING: SEE ABOVE  SOCIAL HISTORY:  Social History   Socioeconomic History  . Marital status: Married    Spouse name: Not on file  . Number of children: Not on file  . Years of education: Not on file  . Highest education level: Not on file  Occupational History  . Occupation: Retired  Tobacco Use  . Smoking status: Never Smoker  . Smokeless tobacco: Former Systems developer    Types: Snuff  Substance and Sexual Activity  . Alcohol use: Yes    Alcohol/week: 1.0 standard drinks    Types: 1 Standard drinks or equivalent per week    Comment: occasionally  . Drug use: No  . Sexual activity: Yes    Birth control/protection: Surgical  Other Topics Concern  . Not on file  Social History Narrative  . Not on file   Social Determinants of Health   Financial Resource Strain:   . Difficulty of Paying Living Expenses: Not on file  Food Insecurity: No Food Insecurity  . Worried About Charity fundraiser in the Last Year: Never true  . Ran Out of Food in the Last Year: Never true  Transportation Needs: No Transportation Needs  . Lack of Transportation (Medical): No  . Lack of Transportation (Non-Medical): No  Physical Activity:   . Days of Exercise per Week: Not on file  . Minutes of Exercise per Session: Not on file  Stress:   . Feeling of Stress : Not on file  Social Connections:   . Frequency of Communication with Friends and Family: Not on file  . Frequency of Social Gatherings with Friends and Family: Not on file  . Attends Religious Services: Not on file  . Active Member of Clubs or Organizations: Not on file  . Attends Archivist Meetings: Not on file  . Marital Status: Not on file  Intimate Partner Violence:   . Fear of Current or Ex-Partner: Not on file  . Emotionally Abused: Not on file  . Physically Abused: Not on file  . Sexually Abused: Not on file     OBSERVATIONS/OBJECTIVE:  BP (!) 163/63 (  BP Location: Right  Arm, Patient Position: Sitting)   Pulse 80   Temp 98.2 F (36.8 C) (Temporal)   Resp 18   Ht '5\' 2"'  (1.575 m)   Wt 207 lb 9.6 oz (94.2 kg)   SpO2 92%   BMI 37.97 kg/m  GENERAL: Patient is a well appearing female in no acute distress HEENT:  Sclerae anicteric.  Oropharynx clear and moist. No ulcerations or evidence of oropharyngeal candidiasis. Neck is supple.  NODES:  No cervical, supraclavicular, or axillary lymphadenopathy palpated.  BREAST EXAM:  Right breast benign, left breast s/p lumpectomy and radiation, no sign of local recurrence.   LUNGS:  Clear to auscultation bilaterally.  No wheezes or rhonchi. HEART:  Regular rate and rhythm. No murmur appreciated. ABDOMEN:  Soft, nontender.  Positive, normoactive bowel sounds. No organomegaly palpated. MSK:  No focal spinal tenderness to palpation. Full range of motion bilaterally in the upper extremities. EXTREMITIES:  No peripheral edema.   SKIN:  Clear with no obvious rashes or skin changes. No nail dyscrasia. NEURO:  Nonfocal. Well oriented.  Appropriate affect.    LABORATORY DATA:  None for this visit.  DIAGNOSTIC IMAGING:   CLINICAL DATA:  Status post left lumpectomy, radiation therapy chemotherapy for breast cancer in 2020. She developed left breast redness and increased warmth three weeks ago. Two weeks ago she was placed on antibiotics and this has resolved. She continues to have a heavy sensation in the left breast and left breast skin thickening.She reports that her last radiation therapy treatment was in November 2020.  EXAM: DIGITAL DIAGNOSTIC BILATERAL MAMMOGRAM WITH CAD AND TOMO  COMPARISON:  Previous exam(s).  ACR Breast Density Category b: There are scattered areas of fibroglandular density.  FINDINGS: Interval post lumpectomy and postradiation changes on the left with moderate diffuse skin thickening. No findings suspicious for malignancy in either breast.  On physical examination today, the  patient has skin thickening in the breast, most pronounced anteriorly, with a dark brown coloration of the skin. There is no skin redness or increased warmth today. There was no palpable mass.  Mammographic images were processed with CAD.  IMPRESSION: 1. Expected postradiation changes on the left. 2. No evidence of malignancy in either breast.  RECOMMENDATION: Bilateral diagnostic mammogram in 1 year.  I have discussed the findings and recommendations with the patient. If applicable, a reminder letter will be sent to the patient regarding the next appointment.  BI-RADS CATEGORY  2: Benign.   Electronically Signed   By: Claudie Revering M.D.   On: 07/10/2019 15:29     ASSESSMENT AND PLAN:  Ms.. Mccoy is a pleasant 74 y.o. female with Stage IB left breast invasive ductal carcinoma, ER-/PR-/HER2-, diagnosed in 07/2018, treated with lumpectomy, adjuvant chemotherapy, and adjuvant radiation therapy.  She presents to the Survivorship Clinic for our initial meeting and routine follow-up post-completion of treatment for breast cancer.    1. Stage IB left breast cancer:  Gloria Mccoy is continuing to recover from definitive treatment for breast cancer. She will follow-up with her medical oncologist, Dr. Jana Hakim in 3 months with history and physical exam per surveillance protocol.   Her mammogram is due 06/2019. Today, a comprehensive survivorship care plan and treatment summary was reviewed with the patient today detailing her breast cancer diagnosis, treatment course, potential late/long-term effects of treatment, appropriate follow-up care with recommendations for the future, and patient education resources.  A copy of this summary, along with a letter will be sent to the patient's primary  care provider via mail/fax/In Basket message after today's visit.    2. Bone health:  Given Gloria Mccoy's age/history of breast cancer, she is at risk for bone demineralization.  She was given education  on specific activities to promote bone health.  3. Cancer screening:  Due to Gloria Mccoy history and her age, she should receive screening for skin cancers, colon cancer, and gynecologic cancers.  The information and recommendations are listed on the patient's comprehensive care plan/treatment summary and were reviewed in detail with the patient.    4. Health maintenance and wellness promotion: Gloria Mccoy was encouraged to consume 5-7 servings of fruits and vegetables per day. We reviewed the "Nutrition Rainbow" handout, as well as the handout "Take Control of Your Health and Reduce Your Cancer Risk" from the [place].  She was also encouraged to engage in moderate to vigorous exercise for 30 minutes per day most days of the week. We discussed the [employer] fitness program, which is designed for cancer survivors to help them become more physically fit after cancer treatments.  She was instructed to limit her alcohol consumption and continue to abstain from tobacco use.     5. Support services/counseling: It is not uncommon for this period of the patient's cancer care trajectory to be one of many emotions and stressors.  We discussed how this can be increasingly difficult during the times of quarantine and social distancing due to the COVID-19 pandemic.   She was given information regarding our available services and encouraged to contact me with any questions or for help enrolling in any of our support group/programs.    Follow up instructions:    -Return to cancer center in 3 months for f/u with Dr. Jana Hakim  -Mammogram due in 06/2019 -She was recommended to continue with the appropriate pandemic precautions.  She knows to call for any questions that may arise between now and her next appointment.  We are happy to see her sooner if needed.  Time spent this encounter: 30 minutes  Scot Dock, NP

## 2019-07-18 ENCOUNTER — Telehealth: Payer: Self-pay | Admitting: Adult Health

## 2019-07-18 NOTE — Telephone Encounter (Signed)
I left a message regarding schedule  

## 2019-08-03 ENCOUNTER — Other Ambulatory Visit: Payer: Self-pay

## 2019-08-03 NOTE — Patient Outreach (Signed)
[facility]) Care Management  08/03/2019  RYNLEIGH STAAL [birth year] [id number]   Telephone Assessment    Outreach attempt to patient. No answer at present.    Plan: RN CM will make outreach attempt to patient within the month of March. RN CM will send unsuccessful outreach letter to patient.   Enzo Montgomery, RN,BSN,CCM [place] Management Telephonic Care Management Coordinator Direct Phone: [phone] Toll Free: [phone] Fax: [phone]

## 2019-08-06 ENCOUNTER — Telehealth: Payer: Self-pay

## 2019-08-06 NOTE — Telephone Encounter (Signed)
08-03-19/Daughter called stating Pt had a COVID shot, now her arm hurst. Should she make an appt here.  Please call [phone]  Thanks renee

## 2019-08-06 NOTE — Telephone Encounter (Signed)
LM for [name] to call me back-cc

## 2019-08-07 NOTE — Telephone Encounter (Signed)
Spoke with pt's daughter. She stated that she spoke with pcp yesterday and was informed to take tylenol.

## 2019-08-08 ENCOUNTER — Ambulatory Visit: Payer: Self-pay

## 2019-08-23 ENCOUNTER — Other Ambulatory Visit: Payer: Self-pay

## 2019-08-23 NOTE — Patient Outreach (Signed)
[facility]) Care Management  08/23/2019  Gloria Mccoy 11/11/1945 [id number]   Telephone Assessment   Outreach attempt #4 to patient. No answer after multiple rings.     Plan: RN CM will make outreach attempt to patient within the month of May.   Enzo Montgomery, RN,BSN,CCM [place] Management Telephonic Care Management Coordinator Direct Phone: [phone] Toll Free: [phone] Fax: [phone]

## 2019-08-30 ENCOUNTER — Ambulatory Visit: Payer: Self-pay

## 2019-09-24 DIAGNOSIS — Z6835 Body mass index (BMI) 35.0-35.9, adult: Secondary | ICD-10-CM | POA: Diagnosis not present

## 2019-09-24 DIAGNOSIS — Z0000 Encounter for general adult medical examination without abnormal findings: Secondary | ICD-10-CM | POA: Diagnosis not present

## 2019-09-24 DIAGNOSIS — Z1389 Encounter for screening for other disorder: Secondary | ICD-10-CM | POA: Diagnosis not present

## 2019-09-24 DIAGNOSIS — E063 Autoimmune thyroiditis: Secondary | ICD-10-CM | POA: Diagnosis not present

## 2019-10-14 NOTE — Progress Notes (Signed)
[place]  [phone] [phone]    ID: Gloria Mccoy DOB: [birth year]  MR#: [id number]  PNT#:[id number]  Patient Care Team: Redmond School, MD as PCP - General (Internal Medicine) Harl Bowie, Alphonse Guild, MD as PCP - Cardiology (Cardiology) Magrinat, Virgie Dad, MD as Consulting Physician (Oncology) Kyung Rudd, MD as Consulting Physician (Radiation Oncology) Rutherford Guys, MD as Consulting Physician (Ophthalmology) Jovita Kussmaul, MD as Consulting Physician (General Surgery) Florance, Tomasa Blase, RN as [place] Management OTHER MD:   THE PATIENT DID NOT SHOW FOR HER APPOINTMENT 10/15/2019  CHIEF COMPLAINT: Triple negative breast cancer  CURRENT TREATMENT: observation   INTERVAL HISTORY: Algis Downs today for follow up of her triple negative breast cancer. She continues under observation.    She saw my nurse practitioner in January complaining of left breast discomfort and no other concerns.She underwent bilateral diagnostic mammography with tomography at [facility] on 07/10/2019 showing: breast density category B; no evidence of malignancy in either breast.  There were the predicted skin thickening and hyperpigmentation secondary to earlier radiation.   REVIEW OF SYSTEMS: Fraya    HISTORY OF CURRENT ILLNESS: From the original intake note:  [name] had routine screening mammography on 07/12/2018 showing a possible abnormality in the left breast. She underwent unilateral left diagnostic mammography with tomography and left breast ultrasonography at [facility] on 07/25/2018 showing: Breast Density Category B. There is a mass with indistinct margins in the lower inner posterior left breast measuring approximately 0.6 cm. Targeted ultrasound of the lower left breast was performed. There is a hypoechoic mass with margin irregularity in the left breast at 7 o'clock, 7 cm from the nipple  measuring 0.7 x 0.5 x 0.6 cm. No lymphadenopathy seen in the left axilla.   Accordingly on 08/01/2018 she proceeded to biopsy of the left breast area in question. The pathology from this procedure showed ([id number]): invasive ductal carcinoma, grade II. Prognostic indicators significant for: estrogen receptor, 0% negative and progesterone receptor, 0% negative. Proliferation marker Ki67 at 40%. HER2 negative (1+) by immunohistochemistry.   The patient's subsequent history is as detailed below.   PAST MEDICAL HISTORY: Past Medical History:  Diagnosis Date  . Arthritis    "hands sometimes" (07/13/2017)  . Coronary artery disease    a. s/p CABG x3 in 06/2017 with LIMA-LAD, SVG-D1, and SVG-RI.  b. 10/2017: cath showing a widely patent LIMA-LAD with ostial occlusion of the SVG-RI and subtotally occluded atretic SVG-D1. Graft occlusion thought to be 2ry to improvement in pre-CABG stenoses.   . Essential hypertension   . Family history of adverse reaction to anesthesia    sister had a complication that was stated she had a "foggy" episode after her breast surgery was readmitted 1 day post op after being discharged from the hospital. Sister also has significant lung problems that could have contributed to this   . Family history of breast cancer   . Family history of colon cancer   . Hypothyroid   . Meniere disease   . Myocardial infarction ([place]) 06/2017   during cardaic rehab  . Obesity   . Osteopenia 01/2012   T score -1.3 FRAX 7.9%/0.6%    PAST SURGICAL HISTORY: Past Surgical History:  Procedure Laterality Date  . BREAST LUMPECTOMY WITH RADIOACTIVE SEED AND SENTINEL LYMPH NODE BIOPSY Left 09/01/2018   Procedure: LEFT BREAST LUMPECTOMY WITH RADIOACTIVE SEED AND SENTINEL LYMPH NODE BIOPSY;  Surgeon: Jovita Kussmaul, MD;  Location: [facility]  OR;  Service: General;  Laterality: Left;  . BREAST SURGERY    . CARDIAC CATHETERIZATION  07/13/2017  . CATARACT EXTRACTION W/PHACO Right 01/28/2015   Procedure:  CATARACT EXTRACTION PHACO AND INTRAOCULAR LENS PLACEMENT :  CDE:  5.70;  Surgeon: Rutherford Guys, MD;  Location: [facility];  Service: Ophthalmology;  Laterality: Right;  . CATARACT EXTRACTION W/PHACO Left 02/11/2015   Procedure: CATARACT EXTRACTION PHACO AND INTRAOCULAR LENS PLACEMENT (IOC);  Surgeon: Rutherford Guys, MD;  Location: [facility];  Service: Ophthalmology;  Laterality: Left;  CDE: 7.38  . COLONOSCOPY N/A 09/26/2012   Procedure: COLONOSCOPY;  Surgeon: Jamesetta So, MD;  Location: [facility];  Service: Gastroenterology;  Laterality: N/A;  . CORONARY ARTERY BYPASS GRAFT N/A 07/15/2017   Procedure: CORONARY ARTERY BYPASS GRAFTING (CABG) X 3 USING LEFT INTERNAL MAMMARY ARTERY AND RIGHT SAPHENOUS VEIN- ENDOSCOPICALLY HARVESTED;  Surgeon: Melrose Nakayama, MD;  Location: [place];  Service: Open Heart Surgery;  Laterality: N/A;  . IR IMAGING GUIDED PORT INSERTION  12/29/2018  . IR REMOVAL TUN ACCESS W/ PORT W/O FL MOD SED  04/26/2019  . LEFT HEART CATH AND CORONARY ANGIOGRAPHY N/A 07/13/2017   Procedure: LEFT HEART CATH AND CORONARY ANGIOGRAPHY;  Surgeon: Martinique, Peter M, MD;  Location: [place] [facility];  Service: Cardiovascular;  Laterality: N/A;  . LEFT HEART CATH AND CORS/GRAFTS ANGIOGRAPHY N/A 11/03/2017   Procedure: LEFT HEART CATH AND CORS/GRAFTS ANGIOGRAPHY;  Surgeon: Troy Sine, MD;  Location: [place] [facility];  Service: Cardiovascular;  Laterality: N/A;  . TEE WITHOUT CARDIOVERSION N/A 07/15/2017   Procedure: TRANSESOPHAGEAL ECHOCARDIOGRAM (TEE);  Surgeon: Melrose Nakayama, MD;  Location: [place];  Service: Open Heart Surgery;  Laterality: N/A;  . TUBAL LIGATION    . TYMPANOPLASTY Left    fluid from ear drum    FAMILY HISTORY: Family History  Problem Relation Age of Onset  . Diabetes Sister        AODM  . Heart disease Sister 98       CABG  . Breast cancer Sister   . Heart disease Brother 51       In his 46s  . Colon cancer Maternal Uncle 40  . Diabetes Sister        AODM   . Hypertension Daughter   . Hypertension Daughter   . Breast cancer Cousin        PATERNAL COUSIN  . Heart attack Father        In his 29s  . Stroke Mother   . Stroke Maternal Grandmother   . Diabetes Maternal Grandfather        d. 109  . Heart attack Paternal Grandmother   . Colon cancer Maternal Uncle 67  . Breast cancer Cousin        dx in her 50s; mat first cousin  . Breast cancer Cousin        dx 52s; d. 4s  . Breast cancer Niece 49   Brytnee's father died from a myocardial infarction at age 10. Patients' mother died from a stroke at age 55. The patient has 3 brothers and 9 sisters. One of Hudson's sisters, [name], was diagnosed with breast cancer at the age of 45. Karlei also has a niece that was diagnosed with breast cancer at 72, and two cousins that were diagnosed with breast cancer.  She believes 1 of her brothers may have prostate cancer.  Patient denies anyone in her family having ovarian or pancreatic cancer. Evangelyne has  an uncle that was diagnosed with colon cancer and an uncle that was diagnosed with "stomach" cancer.    GYNECOLOGIC HISTORY:  No LMP recorded. Patient is postmenopausal. 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 Her husband, Jason Fila, is a retired Administrator. They have two daughters. Their daughter, [name], lives in [place] and is a housewife.  Their other daughter works at [employer]. Azoria had one grandchild that is now deceased. She attends a [employer].    ADVANCED DIRECTIVES: Gargi's husband, Jason Fila, is automatically her healthcare power of attorney.     HEALTH MAINTENANCE: Social History   Tobacco Use  . Smoking status: Never Smoker  . Smokeless tobacco: Former Systems developer    Types: Snuff  Substance Use Topics  . Alcohol use: Yes    Alcohol/week: 1.0  standard drinks    Types: 1 Standard drinks or equivalent per week    Comment: occasionally  . Drug use: No    Colonoscopy: yes, Dr. Arnoldo Morale  PAP:   Bone density: yes, 2016, -1.1 osteopenic   Allergies  Allergen Reactions  . Aloe Vera Itching    Per patient severe itching  . Sulfa Antibiotics Shortness Of Breath  . Sulfasalazine Shortness Of Breath    Current Outpatient Medications  Medication Sig Dispense Refill  . acetaminophen (TYLENOL) 325 MG tablet Take 2 tablets (650 mg total) by mouth every 6 (six) hours as needed for mild pain.    Marland Kitchen aspirin EC 81 MG tablet Take 81 mg by mouth daily.    . cetaphil (CETAPHIL) lotion Apply 1 application topically as needed for dry skin. 236 mL 0  . Cholecalciferol (VITAMIN D PO) Take 1 tablet by mouth daily.     . Cyanocobalamin (VITAMIN B-12 PO) Take 1 tablet by mouth daily.    Marland Kitchen doxycycline (VIBRA-TABS) 100 MG tablet Take 1 tablet (100 mg total) by mouth 2 (two) times daily. 20 tablet 0  . fish oil-omega-3 fatty acids 1000 MG capsule Take 1 g by mouth daily.    . furosemide (LASIX) 40 MG tablet Take 1 tablet (40 mg total) by mouth daily. 90 tablet 3  . HYDROcodone-acetaminophen (NORCO/VICODIN) 5-325 MG tablet Take 1-2 tablets by mouth every 6 (six) hours as needed for moderate pain or severe pain. 60 tablet 0  . levothyroxine (SYNTHROID) 25 MCG tablet     . levothyroxine (SYNTHROID, LEVOTHROID) 75 MCG tablet Take 75 mcg by mouth daily.    Marland Kitchen lidocaine-prilocaine (EMLA) cream Apply 1 application topically as needed. (Patient not taking: Reported on 07/17/2019) 30 g 0  . losartan (COZAAR) 25 MG tablet Take 0.5 tablets (12.5 mg total) by mouth daily. 45 tablet 3  . meloxicam (MOBIC) 15 MG tablet Take 15 mg by mouth daily.    . metoprolol tartrate (LOPRESSOR) 25 MG tablet Take 1 tablet (25 mg total) by mouth 2 (two) times daily. (Patient not taking: Reported on 07/17/2019) 180 tablet 3  . mineral oil-hydrophilic petrolatum (AQUAPHOR) ointment Apply  topically as needed for dry skin. 420 g 0  . tobramycin-dexamethasone (TOBRADEX) ophthalmic solution Place 2 drops into the right eye every 4 (four) hours while awake. 5 mL 0  . vitamin E 100 UNIT capsule Take 1 capsule (100 Units total) by mouth daily. 30 capsule 0   No current  facility-administered medications for this visit.     OBJECTIVE:  There were no vitals filed for this visit.   There is no height or weight on file to calculate BMI.   Wt Readings from Last 3 Encounters:  07/17/19 207 lb 9.6 oz (94.2 kg)  06/26/19 207 lb 3.2 oz (94 kg)  04/26/19 191 lb (86.6 kg)  ECOG FS:1 - Symptomatic but completely ambulatory    LAB RESULTS:  CMP     Component Value Date/Time   NA 141 04/26/2019 1338   K 3.6 04/26/2019 1338   CL 107 04/26/2019 1338   CO2 25 04/26/2019 1338   GLUCOSE 96 04/26/2019 1338   BUN 11 04/26/2019 1338   CREATININE 0.85 04/26/2019 1338   CREATININE 0.78 01/16/2019 0840   CALCIUM 9.0 04/26/2019 1338   PROT 6.2 (L) 04/05/2019 1307   ALBUMIN 3.3 (L) 04/05/2019 1307   AST 19 04/05/2019 1307   AST 22 01/16/2019 0840   ALT 12 04/05/2019 1307   ALT 10 01/16/2019 0840   ALKPHOS 72 04/05/2019 1307   BILITOT 0.4 04/05/2019 1307   BILITOT 0.7 01/16/2019 0840   GFRNONAA >60 04/26/2019 1338   GFRNONAA >60 01/16/2019 0840   GFRAA >60 04/26/2019 1338   GFRAA >60 01/16/2019 0840    No results found for: TOTALPROTELP, ALBUMINELP, A1GS, A2GS, BETS, BETA2SER, GAMS, MSPIKE, SPEI  No results found for: KPAFRELGTCHN, LAMBDASER, KAPLAMBRATIO  Lab Results  Component Value Date   WBC 7.6 04/26/2019   NEUTROABS 3.7 04/26/2019   HGB 12.9 04/26/2019   HCT 37.8 04/26/2019   MCV 86.1 04/26/2019   PLT 268 04/26/2019   No results found for: LABCA2  No components found for: YQMGNO037  No results for input(s): INR in the last 168 hours.  No results found for: LABCA2  No results found for: CWU889  No results found for: VQX450  No results found for: TUU828  No  results found for: CA2729  No components found for: HGQUANT  No results found for: CEA1 / No results found for: CEA1   No results found for: AFPTUMOR  No results found for: CHROMOGRNA  No results found for: HGBA, HGBA2QUANT, HGBFQUANT, HGBSQUAN (Hemoglobinopathy evaluation)   No results found for: LDH  No results found for: IRON, TIBC, IRONPCTSAT (Iron and TIBC)  No results found for: FERRITIN  Urinalysis    Component Value Date/Time   COLORURINE YELLOW 07/14/2017 2015   APPEARANCEUR HAZY (A) 07/14/2017 2015   LABSPEC 1.011 07/14/2017 2015   PHURINE 6.0 07/14/2017 2015   [place] 07/14/2017 2015   [place] 07/14/2017 2015   [place] 07/14/2017 2015   [place] 07/14/2017 2015   PROTEINUR NEGATIVE 07/14/2017 2015   UROBILINOGEN 0.2 07/03/2014 0852   NITRITE NEGATIVE 07/14/2017 2015   LEUKOCYTESUR LARGE (A) 07/14/2017 2015    STUDIES:  No results found.   ELIGIBLE FOR AVAILABLE RESEARCH PROTOCOL: no   ASSESSMENT: 74 y.o. [place], [place] woman status post left breast lower inner quadrant biopsy 08/01/2018 for a clinical T1c N0, stage IB invasive ductal carcinoma, grade 2, triple negative, with an MIB-1 of 40%  (1) genetics testing 08/29/2018 through with Hereditary Gene Panel offered by [facility] found no deleterious mutations in APC, ATM, AXIN2, BARD1, BMPR1A, BRCA1, BRCA2, BRIP1, CDH1, CDK4, CDKN2A (p14ARF), CDKN2A (p16INK4a), CHEK2, CTNNA1, DICER1, EPCAM (Deletion/duplication testing only), GREM1 (promoter region deletion/duplication testing only), KIT, MEN1, MLH1, MSH2, MSH3, MSH6, MUTYH, NBN, NF1, NHTL1, PALB2, PDGFRA, PMS2, POLD1, POLE, PTEN, RAD50, RAD51C, RAD51D, SDHB, SDHC,  SDHD, SMAD4, SMARCA4. STK11, TP53, TSC1, TSC2, and VHL.  The following genes were evaluated for sequence changes only: SDHA and HOXB13 c.251G>A variant only.   (a) MLH1 c.1890T>G VUS identified  (2) status post left lumpectomy with sentinel lymph node sampling  09/01/2018 for a pT1c pN0, stage IB invasive ductal carcinoma, grade 2, with negative margins.  (a) a total of 3 sentinel lymph nodes were removed  (3) chemotherapy consisting of cyclophosphamide, methotrexate, and fluorouracil (CMF), repeated every 21 days x 8, from 10/24/2018-04/05/2019  (4) adjuvant radiation given concurrently with cycles 3-4-5 of chemotherapy from 12/05/2018-01/19/2019.  The left breast was treated to 50.4 Gy in 28 fractions, followed by a 10 Gy boost to the lumpectomy cavity.   PLAN: Colette id not show for her 10/15/2019 appointment. A letter has been sent.   Virgie Dad. Magrinat, MD  10/14/19 11:09 AM Medical Oncology and Hematology [facility] [place], [place] [address] Tel. [phone]    Fax. [phone]    I, Wilburn Mylar, am acting as scribe for Dr. Virgie Dad. Magrinat.  I, Lurline Del MD, have reviewed the above documentation for accuracy and completeness, and I agree with the above.    *Total Encounter Time as defined by the Centers for Medicare and Medicaid Services includes, in addition to the face-to-face time of a patient visit (documented in the note above) non-face-to-face time: obtaining and reviewing outside history, ordering and reviewing medications, tests or procedures, care coordination (communications with other health care professionals or caregivers) and documentation in the medical record.

## 2019-10-15 ENCOUNTER — Inpatient Hospital Stay (HOSPITAL_BASED_OUTPATIENT_CLINIC_OR_DEPARTMENT_OTHER): Payer: Medicare HMO | Admitting: Oncology

## 2019-10-15 ENCOUNTER — Inpatient Hospital Stay: Payer: Medicare HMO | Attending: Genetic Counselor

## 2019-10-15 ENCOUNTER — Encounter: Payer: Self-pay | Admitting: Oncology

## 2019-10-15 DIAGNOSIS — Z951 Presence of aortocoronary bypass graft: Secondary | ICD-10-CM

## 2019-10-15 DIAGNOSIS — I200 Unstable angina: Secondary | ICD-10-CM

## 2019-10-15 DIAGNOSIS — C50512 Malignant neoplasm of lower-outer quadrant of left female breast: Secondary | ICD-10-CM

## 2019-10-15 DIAGNOSIS — Z171 Estrogen receptor negative status [ER-]: Secondary | ICD-10-CM

## 2019-10-22 DIAGNOSIS — E6609 Other obesity due to excess calories: Secondary | ICD-10-CM | POA: Diagnosis not present

## 2019-10-22 DIAGNOSIS — Z6835 Body mass index (BMI) 35.0-35.9, adult: Secondary | ICD-10-CM | POA: Diagnosis not present

## 2019-10-22 DIAGNOSIS — G5601 Carpal tunnel syndrome, right upper limb: Secondary | ICD-10-CM | POA: Diagnosis not present

## 2019-10-31 ENCOUNTER — Other Ambulatory Visit: Payer: Self-pay

## 2019-10-31 NOTE — Patient Outreach (Signed)
[facility]) Care Management  10/31/2019  Gloria Mccoy 1946-02-22 [id number]   Telephone Assessment    Outreach attempt to patient. No answer after multiple rings.      Plan: RN CM will make outreach attempt to patient within the month of July.   Enzo Montgomery, RN,BSN,CCM [place] Management Telephonic Care Management Coordinator Direct Phone: [phone] Toll Free: [phone] Fax: [phone]

## 2019-11-02 ENCOUNTER — Ambulatory Visit: Payer: Self-pay

## 2019-12-08 ENCOUNTER — Other Ambulatory Visit: Payer: Self-pay | Admitting: Cardiology

## 2019-12-10 DIAGNOSIS — Z681 Body mass index (BMI) 19 or less, adult: Secondary | ICD-10-CM | POA: Diagnosis not present

## 2019-12-10 DIAGNOSIS — J329 Chronic sinusitis, unspecified: Secondary | ICD-10-CM | POA: Diagnosis not present

## 2019-12-10 DIAGNOSIS — E063 Autoimmune thyroiditis: Secondary | ICD-10-CM | POA: Diagnosis not present

## 2019-12-10 DIAGNOSIS — I10 Essential (primary) hypertension: Secondary | ICD-10-CM | POA: Diagnosis not present

## 2019-12-10 DIAGNOSIS — J042 Acute laryngotracheitis: Secondary | ICD-10-CM | POA: Diagnosis not present

## 2019-12-27 ENCOUNTER — Other Ambulatory Visit: Payer: Self-pay

## 2019-12-27 NOTE — Patient Outreach (Signed)
[facility]) Care Management  12/27/2019  Gloria Mccoy 03-24-1946 [id number]   Telephone Assessment    Unsuccessful; quarterly outreach attempt to patient.        Plan: RN CM will make quarterly outreach attempt within the month of October.   Enzo Montgomery, RN,BSN,CCM [place] Management Telephonic Care Management Coordinator Direct Phone: [phone] Toll Free: [phone] Fax: [phone]

## 2020-01-02 ENCOUNTER — Encounter: Payer: Self-pay | Admitting: Student

## 2020-01-02 ENCOUNTER — Other Ambulatory Visit: Payer: Self-pay

## 2020-01-02 ENCOUNTER — Ambulatory Visit: Payer: Medicare HMO | Admitting: Student

## 2020-01-02 VITALS — BP 116/58 | HR 62 | Ht 62.0 in | Wt 199.0 lb

## 2020-01-02 DIAGNOSIS — R600 Localized edema: Secondary | ICD-10-CM

## 2020-01-02 DIAGNOSIS — E785 Hyperlipidemia, unspecified: Secondary | ICD-10-CM | POA: Diagnosis not present

## 2020-01-02 DIAGNOSIS — I10 Essential (primary) hypertension: Secondary | ICD-10-CM

## 2020-01-02 DIAGNOSIS — I2510 Atherosclerotic heart disease of native coronary artery without angina pectoris: Secondary | ICD-10-CM

## 2020-01-02 MED ORDER — ATORVASTATIN CALCIUM 80 MG PO TABS: 80.0000 mg | ORAL_TABLET | Freq: Every day | ORAL | 3 refills | Status: DC

## 2020-01-02 MED ORDER — FUROSEMIDE 40 MG PO TABS: 40.0000 mg | ORAL_TABLET | Freq: Every day | ORAL | 3 refills | Status: DC

## 2020-01-02 MED ORDER — NITROGLYCERIN 0.4 MG SL SUBL: 0.4000 mg | SUBLINGUAL_TABLET | SUBLINGUAL | 3 refills | Status: DC | PRN

## 2020-01-02 MED ORDER — LOSARTAN POTASSIUM 25 MG PO TABS: 12.5000 mg | ORAL_TABLET | Freq: Every day | ORAL | 3 refills | Status: DC

## 2020-01-02 MED ORDER — METOPROLOL TARTRATE 25 MG PO TABS: 25.0000 mg | ORAL_TABLET | Freq: Two times a day (BID) | ORAL | 3 refills | Status: DC

## 2020-01-02 NOTE — Patient Instructions (Signed)
Medication Instructions:  Re-start Atorvastatin 80 mg daily at dinner    *If you need a refill on your cardiac medications before your next appointment, please call your pharmacy*   Lab Work: None today If you have labs (blood work) drawn today and your tests are completely normal, you will receive your results only by: Marland Kitchen MyChart Message (if you have MyChart) OR . A paper copy in the mail If you have any lab test that is abnormal or we need to change your treatment, we will call you to review the results.   Testing/Procedures: None today   Follow-Up: At [facility] you and your health needs are our priority.  As part of our continuing mission to provide you with exceptional heart care, we have created designated Provider Care Teams.  These Care Teams include your primary Cardiologist (physician) and Advanced Practice Providers (APPs -  Physician Assistants and Nurse Practitioners) who all work together to provide you with the care you need, when you need it.  We recommend signing up for the patient portal called "MyChart".  Sign up information is provided on this After Visit Summary.  MyChart is used to connect with patients for Virtual Visits (Telemedicine).  Patients are able to view lab/test results, encounter notes, upcoming appointments, etc.  Non-urgent messages can be sent to your provider as well.   To learn more about what you can do with MyChart, go to [email or web address].    Your next appointment:   12 month(s)  The format for your next appointment:   In Person  Provider:   Carlyle Dolly, MD   Other Instructions None      Thank you for choosing [facility] !

## 2020-01-02 NOTE — Progress Notes (Signed)
Cardiology Office Note    Date:  01/02/2020   ID:  Gloria Mccoy, DOB [birth year], MRN [id number]  PCP:  Redmond School, MD  Cardiologist: Gloria Dolly, MD    Chief Complaint  Patient presents with  . Follow-up    Annual Visit    History of Present Illness:    Gloria Mccoy is a 74 y.o. female with past medical history of CAD (s/p CABG in 06/2017 with LIMA-LAD, SVG-D1, and SVG-RI, cath in 10/2017 showing patent LIMA-LAD with ostial occlusion of SVG-RI and occluded atretic SVG-D1 thought to be secondary to improvement in prior pre-CABG stenoses), HTN, HLD, Hypothyroidism, breast cancer (s/p lumpectomy and chemoradiation) and tobacco use (smokeless tobacco) who presents to the office today for annual follow-up.   She most recently had a phone visit with Dr. Harl Mccoy in 02/2019 and reported some intermittent chest discomfort at that time which could last for a few seconds and spontaneously resolved. This was not associated with exertion. Plavix was discontinued given she was more than a year out from her event and she was continued on ASA 81mg  daily, Lasix 40mg  daily, Losartan 12.5 mg daily, Lopressor 25mg  BID and Atorvastatin 80mg  daily.   In talking with the patient today, she reports overall doing well from a cardiac perspective since her last office visit. She denies any recent exertional chest pain or palpitations. She does have NYHA class II symptoms but reports she is very mindful of her activity level and stops if she feels more fatigued. She did help her husband in gathering wood the other day and denies any symptoms with this. No recent orthopnea, PND or lower extremity edema. She did have a tooth extraction last week but reports healing well from this.   For unclear reasons, she is no longer listed as taking Atorvastatin 80 mg daily. Her daughter reports this has not been refilled by her pharmacy in several months.  Past Medical History:  Diagnosis Date  . Arthritis     "hands sometimes" (07/13/2017)  . Coronary artery disease    a. s/p CABG x3 in 06/2017 with LIMA-LAD, SVG-D1, and SVG-RI.  b. 10/2017: cath showing a widely patent LIMA-LAD with ostial occlusion of the SVG-RI and subtotally occluded atretic SVG-D1. Graft occlusion thought to be 2ry to improvement in pre-CABG stenoses.   . Essential hypertension   . Family history of adverse reaction to anesthesia    sister had a complication that was stated she had a "foggy" episode after her breast surgery was readmitted 1 day post op after being discharged from the hospital. Sister also has significant lung problems that could have contributed to this   . Family history of breast cancer   . Family history of colon cancer   . Hypothyroid   . Meniere disease   . Myocardial infarction ([place]) 06/2017   during cardaic rehab  . Obesity   . Osteopenia 01/2012   T score -1.3 FRAX 7.9%/0.6%    Past Surgical History:  Procedure Laterality Date  . BREAST LUMPECTOMY WITH RADIOACTIVE SEED AND SENTINEL LYMPH NODE BIOPSY Left 09/01/2018   Procedure: LEFT BREAST LUMPECTOMY WITH RADIOACTIVE SEED AND SENTINEL LYMPH NODE BIOPSY;  Surgeon: Jovita Kussmaul, MD;  Location: [facility];  Service: General;  Laterality: Left;  . BREAST SURGERY    . CARDIAC CATHETERIZATION  07/13/2017  . CATARACT EXTRACTION W/PHACO Right 01/28/2015   Procedure: CATARACT EXTRACTION PHACO AND INTRAOCULAR LENS PLACEMENT :  CDE:  5.70;  Surgeon: Rutherford Guys, MD;  Location: [facility];  Service: Ophthalmology;  Laterality: Right;  . CATARACT EXTRACTION W/PHACO Left 02/11/2015   Procedure: CATARACT EXTRACTION PHACO AND INTRAOCULAR LENS PLACEMENT (IOC);  Surgeon: Rutherford Guys, MD;  Location: [facility];  Service: Ophthalmology;  Laterality: Left;  CDE: 7.38  . COLONOSCOPY N/A 09/26/2012   Procedure: COLONOSCOPY;  Surgeon: Jamesetta So, MD;  [facility];  Service: Gastroenterology;  Laterality: N/A;  . CORONARY ARTERY BYPASS GRAFT N/A 07/15/2017   Procedure:  CORONARY ARTERY BYPASS GRAFTING (CABG) X 3 USING LEFT INTERNAL MAMMARY ARTERY AND RIGHT SAPHENOUS VEIN- ENDOSCOPICALLY HARVESTED;  Surgeon: Melrose Nakayama, MD;  Location: [facility];  Service: Open Heart Surgery;  Laterality: N/A;  . IR IMAGING GUIDED PORT INSERTION  12/29/2018  . IR REMOVAL TUN ACCESS W/ PORT W/O FL MOD SED  04/26/2019  . LEFT HEART CATH AND CORONARY ANGIOGRAPHY N/A 07/13/2017   Procedure: LEFT HEART CATH AND CORONARY ANGIOGRAPHY;  Surgeon: Martinique, Peter M, MD;  Location: [facility];  Service: Cardiovascular;  Laterality: N/A;  . LEFT HEART CATH AND CORS/GRAFTS ANGIOGRAPHY N/A 11/03/2017   Procedure: LEFT HEART CATH AND CORS/GRAFTS ANGIOGRAPHY;  Surgeon: Troy Sine, MD;  Location: [facility];  Service: Cardiovascular;  Laterality: N/A;  . TEE WITHOUT CARDIOVERSION N/A 07/15/2017   Procedure: TRANSESOPHAGEAL ECHOCARDIOGRAM (TEE);  Surgeon: Melrose Nakayama, MD;  Location: [facility];  Service: Open Heart Surgery;  Laterality: N/A;  . TUBAL LIGATION    . TYMPANOPLASTY Left    fluid from ear drum    Current Medications: Outpatient Medications Prior to Visit  Medication Sig Dispense Refill  . acetaminophen (TYLENOL) 325 MG tablet Take 2 tablets (650 mg total) by mouth every 6 (six) hours as needed for mild pain.    Marland Kitchen aspirin EC 81 MG tablet Take 81 mg by mouth daily.    . cetaphil (CETAPHIL) lotion Apply 1 application topically as needed for dry skin. 236 mL 0  . Cholecalciferol (VITAMIN D PO) Take 1 tablet by mouth daily.     . Cyanocobalamin (VITAMIN B-12 PO) Take 1 tablet by mouth daily.    . fish oil-omega-3 fatty acids 1000 MG capsule Take 1 g by mouth daily.    Marland Kitchen HYDROcodone-acetaminophen (NORCO/VICODIN) 5-325 MG tablet Take 1-2 tablets by mouth every 6 (six) hours as needed for moderate pain or severe pain. 60 tablet 0  . levothyroxine (SYNTHROID) 25 MCG tablet     . levothyroxine (SYNTHROID, LEVOTHROID) 75 MCG tablet Take 75 mcg by mouth daily.    .  mineral oil-hydrophilic petrolatum (AQUAPHOR) ointment Apply topically as needed for dry skin. 420 g 0  . furosemide (LASIX) 40 MG tablet Take 1 tablet (40 mg total) by mouth daily. 90 tablet 3  . lidocaine-prilocaine (EMLA) cream Apply 1 application topically as needed. 30 g 0  . losartan (COZAAR) 25 MG tablet Take 0.5 tablets (12.5 mg total) by mouth daily. 45 tablet 3  . metoprolol tartrate (LOPRESSOR) 25 MG tablet TAKE 1 TABLET TWICE DAILY 180 tablet 3  . vitamin E 100 UNIT capsule Take 1 capsule (100 Units total) by mouth daily. 30 capsule 0  . doxycycline (VIBRA-TABS) 100 MG tablet Take 1 tablet (100 mg total) by mouth 2 (two) times daily. 20 tablet 0  . meloxicam (MOBIC) 15 MG tablet Take 15 mg by mouth daily.    Marland Kitchen tobramycin-dexamethasone (TOBRADEX) ophthalmic solution Place 2 drops into the right eye every 4 (four) hours while awake. 5 mL 0  No facility-administered medications prior to visit.     Allergies:   Aloe vera, Sulfa antibiotics, and Sulfasalazine   Social History   Socioeconomic History  . Marital status: Married    Spouse name: Not on file  . Number of children: Not on file  . Years of education: Not on file  . Highest education level: Not on file  Occupational History  . Occupation: Retired  Tobacco Use  . Smoking status: Never Smoker  . Smokeless tobacco: Former Systems developer    Types: Snuff  Vaping Use  . Vaping Use: Never used  Substance and Sexual Activity  . Alcohol use: Yes    Alcohol/week: 1.0 standard drink    Types: 1 Standard drinks or equivalent per week    Comment: occasionally  . Drug use: No  . Sexual activity: Yes    Birth control/protection: Surgical  Other Topics Concern  . Not on file  Social History Narrative  . Not on file   Social Determinants of Health   Financial Resource Strain:   . Difficulty of Paying Living Expenses:   Food Insecurity: No Food Insecurity  . Worried About Charity fundraiser in the Last Year: Never true  . Ran  Out of Food in the Last Year: Never true  Transportation Needs:   . Lack of Transportation (Medical):   Marland Kitchen Lack of Transportation (Non-Medical):   Physical Activity:   . Days of Exercise per Week:   . Minutes of Exercise per Session:   Stress:   . Feeling of Stress :   Social Connections:   . Frequency of Communication with Friends and Family:   . Frequency of Social Gatherings with Friends and Family:   . Attends Religious Services:   . Active Member of Clubs or Organizations:   . Attends Archivist Meetings:   Marland Kitchen Marital Status:      Family History:  The patient's family history includes Breast cancer in her cousin, cousin, cousin, and sister; Breast cancer (age of onset: 60) in her niece; Colon cancer (age of onset: 24) in her maternal uncle; Colon cancer (age of onset: 31) in her maternal uncle; Diabetes in her maternal grandfather, sister, and sister; Heart attack in her father and paternal grandmother; Heart disease (age of onset: 86) in her sister; Heart disease (age of onset: 31) in her brother; Hypertension in her daughter and daughter; Stroke in her maternal grandmother and mother.   Review of Systems:   Please see the history of present illness.     General:  No chills, fever, night sweats or weight changes.  Cardiovascular:  No chest pain, edema, orthopnea, palpitations, paroxysmal nocturnal dyspnea. Positive for dyspnea on exertion (at baseline).  Dermatological: No rash, lesions/masses Respiratory: No cough, dyspnea Urologic: No hematuria, dysuria Abdominal:   No nausea, vomiting, diarrhea, bright red blood per rectum, melena, or hematemesis Neurologic:  No visual changes, wkns, changes in mental status. All other systems reviewed and are otherwise negative except as noted above.   Physical Exam:    VS:  BP (!) 116/58   Pulse 62   Ht 5\' 2"  (1.575 m)   Wt 199 lb (90.3 kg)   SpO2 95%   BMI 36.40 kg/m    General: Well developed, well nourished,female  appearing in no acute distress. Head: Normocephalic, atraumatic, sclera non-icteric.  Neck: No carotid bruits. JVD not elevated.  Lungs: Respirations regular and unlabored, without wheezes or rales.  Heart: Regular rate and rhythm. No S3 or  S4.  No murmur, no rubs, or gallops appreciated. Abdomen: Soft, non-tender, non-distended. No obvious abdominal masses. Msk:  Strength and tone appear normal for age. No obvious joint deformities or effusions. Extremities: No clubbing or cyanosis. No lower extremity edema.  Distal pedal pulses are 2+ bilaterally. Neuro: Alert and oriented X 3. Moves all extremities spontaneously. No focal deficits noted. Psych:  Responds to questions appropriately with a normal affect. Skin: No rashes or lesions noted  Wt Readings from Last 3 Encounters:  01/02/20 199 lb (90.3 kg)  07/17/19 207 lb 9.6 oz (94.2 kg)  06/26/19 207 lb 3.2 oz (94 kg)     Studies/Labs Reviewed:   EKG:  EKG is ordered today. The ekg ordered today demonstrates NSR, HR 60 with inferior infarct pattern. No acute ST abnormalities when compared to prior tracings.   Recent Labs: 04/05/2019: ALT 12 04/26/2019: BUN 11; Creatinine, Ser 0.85; Hemoglobin 12.9; Platelets 268; Potassium 3.6; Sodium 141   Lipid Panel    Component Value Date/Time   CHOL 79 11/18/2017 0935   TRIG 52 11/18/2017 0935   HDL 25 (L) 11/18/2017 0935   CHOLHDL 3.2 11/18/2017 0935   VLDL 10 11/18/2017 0935   LDLCALC 44 11/18/2017 0935    Additional studies/ records that were reviewed today include:   Echocardiogram: 06/2017 Study Conclusions   - Left ventricle: The cavity size was normal. Wall thickness was  increased in a pattern of mild LVH. Systolic function was normal.  The estimated ejection fraction was in the range of 55% to 60%.  Wall motion was normal; there were no regional wall motion  abnormalities. Doppler parameters are consistent with abnormal  left ventricular relaxation (grade 1 diastolic  dysfunction).  - Mitral valve: Valve area by pressure half-time: 1.26 cm^2.   Impressions:   - Normal LV systolic function; mild LVH; mild diastolic  dysfunction.   Cardiac Catheterization: 10/2017  Ost 1st Diag to 1st Diag lesion is 40% stenosed.  Ost Cx lesion is 25% stenosed.  Ost Ramus lesion is 20% stenosed.  Ost LAD lesion is 45% stenosed.  Prox LAD to Mid LAD lesion is 80% stenosed.  Origin lesion is 100% stenosed.  Origin lesion is 99% stenosed.  Origin to Dist Graft lesion is 99% stenosed.  Dist Graft lesion is 100% stenosed.  LV end diastolic pressure is mildly elevated.  The left ventricular systolic function is normal.   Hyperdynamic LV function with an EF of 65% without focal segmental wall motion abnormalities.  Native coronary obstructive disease with 40% ostial LAD stenosis, diffuse 40 to 50% proximal diagonal stenosis with 80% LAD stenosis diffusely after the diagonal takeoff and evidence for competitive filling to the mid LAD via the LIMA graft; 25% ostial smooth narrowing in the ramus intermediate vessel; 25% ostial smooth narrowing in the left circumflex vessel; normal RCA.  Widely patent LIMA graft which supplies the mid LAD.  Totally ostial occlusion of the SVG which had supplied the ramus intermediate vessel.  Subtotally occluded ostial vein graft with diffuse 99% narrowing insistent with an atretic graft  which does not fill the diagonal vessel.  RECOMMENDATION: Medical therapy.  I suspect the early vein graft occlusion was contributed by improvement in the prior pre-CABG stenoses.   Assessment:    1. Coronary artery disease involving native coronary artery of native heart without angina pectoris   2. Essential hypertension   3. Hyperlipidemia LDL goal <70   4. Bilateral lower extremity edema      Plan:  In order of problems listed above:  1. CAD - She is s/p CABG in 06/2017 with LIMA-LAD, SVG-D1, and SVG-RI with cath in  10/2017 showing patent LIMA-LAD with ostial occlusion of SVG-RI and occluded atretic SVG-D1 thought to be secondary to improvement in prior pre-CABG stenoses.  - She has baseline dyspnea on exertion but denies any acute changes in this. No recent chest pain.  - Continue current medication regimen with ASA 81mg  daily and Lopressor 25mg  BID. Will restart Atorvastatin 80mg  daily as she was tolerating this well but appears it was stopped due to not having refills at her pharmacy.   2. HTN - BP is well-controlled at 116/58 during today's visit. Continue current medication regimen with Losartan 12.5mg  daily and Lopressor 25mg  BID.   3. HLD - LDL was previously in 44 in 2019. Will request a copy of most recent records. She has been off Atorvastatin 80mg  for several months and was informed to resume this. Goal LDL is less than 70 with her known CAD.   4. Lower Extremity Edema - She does not have pitting edema on examination today. Remains on Lasix 40mg  daily. She reports having labs with her PCP within the past 1-2 months and I will request a copy of these. Creatinine was stable at 0.85 in 04/2019.    Medication Adjustments/Labs and Tests Ordered: Current medicines are reviewed at length with the patient today.  Concerns regarding medicines are outlined above.  Medication changes, Labs and Tests ordered today are listed in the Patient Instructions below. Patient Instructions  Medication Instructions:  Re-start Atorvastatin 80 mg daily at dinner  *If you need a refill on your cardiac medications before your next appointment, please call your pharmacy*   Lab Work: None today If you have labs (blood work) drawn today and your tests are completely normal, you will receive your results only by: Marland Kitchen MyChart Message (if you have MyChart) OR . A paper copy in the mail If you have any lab test that is abnormal or we need to change your treatment, we will call you to review the  results.   Testing/Procedures: None today   Follow-Up: At [facility], you and your health needs are our priority.  As part of our continuing mission to provide you with exceptional heart care, we have created designated Provider Care Teams.  These Care Teams include your primary Cardiologist (physician) and Advanced Practice Providers (APPs -  Physician Assistants and Nurse Practitioners) who all work together to provide you with the care you need, when you need it.  We recommend signing up for the patient portal called "MyChart".  Sign up information is provided on this After Visit Summary.  MyChart is used to connect with patients for Virtual Visits (Telemedicine).  Patients are able to view lab/test results, encounter notes, upcoming appointments, etc.  Non-urgent messages can be sent to your provider as well.   To learn more about what you can do with MyChart, go to [email or web address].    Your next appointment:   12 month(s)  The format for your next appointment:   In Person  Provider:   Carlyle Dolly, MD   Other Instructions None   Thank you for choosing [facility] !       Signed, Erma Heritage, PA-C  01/02/2020 7:38 PM    [facility] [address] [place], [place] [address] Phone: [phone] Fax: [phone]

## 2020-01-04 ENCOUNTER — Ambulatory Visit: Payer: Self-pay

## 2020-01-09 ENCOUNTER — Telehealth: Payer: Self-pay | Admitting: Adult Health

## 2020-01-09 NOTE — Telephone Encounter (Signed)
Rescheduled appointment per 7/21 message. I spoke with patient's sister, Lafe Garin, and left a message with her for the patient to call us back about appointment change.

## 2020-01-10 ENCOUNTER — Telehealth: Payer: Self-pay | Admitting: Adult Health

## 2020-01-10 NOTE — Telephone Encounter (Signed)
Rescheduled appointment per provider message. I sent patient letter with updated appointment calendar.

## 2020-01-28 ENCOUNTER — Emergency Department (HOSPITAL_COMMUNITY)
Admission: EM | Admit: 2020-01-28 | Discharge: 2020-01-28 | Disposition: A | Discharge status: Home / Self Care | Payer: Medicare HMO | Attending: Emergency Medicine | Admitting: Emergency Medicine

## 2020-01-28 ENCOUNTER — Encounter (HOSPITAL_COMMUNITY): Payer: Self-pay | Admitting: Emergency Medicine

## 2020-01-28 ENCOUNTER — Emergency Department (HOSPITAL_COMMUNITY): Payer: Medicare HMO

## 2020-01-28 ENCOUNTER — Ambulatory Visit: Payer: Self-pay

## 2020-01-28 ENCOUNTER — Other Ambulatory Visit: Payer: Self-pay

## 2020-01-28 DIAGNOSIS — Y9289 Other specified places as the place of occurrence of the external cause: Secondary | ICD-10-CM | POA: Insufficient documentation

## 2020-01-28 DIAGNOSIS — E039 Hypothyroidism, unspecified: Secondary | ICD-10-CM | POA: Insufficient documentation

## 2020-01-28 DIAGNOSIS — Y9389 Activity, other specified: Secondary | ICD-10-CM | POA: Insufficient documentation

## 2020-01-28 DIAGNOSIS — I2510 Atherosclerotic heart disease of native coronary artery without angina pectoris: Secondary | ICD-10-CM | POA: Diagnosis not present

## 2020-01-28 DIAGNOSIS — W108XXA Fall (on) (from) other stairs and steps, initial encounter: Secondary | ICD-10-CM | POA: Diagnosis not present

## 2020-01-28 DIAGNOSIS — M7989 Other specified soft tissue disorders: Secondary | ICD-10-CM | POA: Diagnosis not present

## 2020-01-28 DIAGNOSIS — Y999 Unspecified external cause status: Secondary | ICD-10-CM | POA: Insufficient documentation

## 2020-01-28 DIAGNOSIS — C50512 Malignant neoplasm of lower-outer quadrant of left female breast: Secondary | ICD-10-CM | POA: Insufficient documentation

## 2020-01-28 DIAGNOSIS — S0003XA Contusion of scalp, initial encounter: Secondary | ICD-10-CM | POA: Diagnosis not present

## 2020-01-28 DIAGNOSIS — S9032XA Contusion of left foot, initial encounter: Secondary | ICD-10-CM | POA: Insufficient documentation

## 2020-01-28 DIAGNOSIS — S0990XA Unspecified injury of head, initial encounter: Secondary | ICD-10-CM | POA: Diagnosis not present

## 2020-01-28 DIAGNOSIS — W19XXXA Unspecified fall, initial encounter: Secondary | ICD-10-CM

## 2020-01-28 DIAGNOSIS — I10 Essential (primary) hypertension: Secondary | ICD-10-CM | POA: Diagnosis not present

## 2020-01-28 DIAGNOSIS — Z79899 Other long term (current) drug therapy: Secondary | ICD-10-CM | POA: Diagnosis not present

## 2020-01-28 DIAGNOSIS — S91312A Laceration without foreign body, left foot, initial encounter: Secondary | ICD-10-CM | POA: Diagnosis not present

## 2020-01-28 DIAGNOSIS — I6782 Cerebral ischemia: Secondary | ICD-10-CM | POA: Diagnosis not present

## 2020-01-28 NOTE — Discharge Instructions (Addendum)
Return if any problems.  See your Physician for recheck in 1 week if pain persist °

## 2020-01-28 NOTE — ED Triage Notes (Signed)
Pt c/o of fall yesterday. C/o of headache, left leg pain and left great toe

## 2020-01-28 NOTE — ED Provider Notes (Signed)
[facility] EMERGENCY DEPARTMENT Provider Note   CSN: [id number] Arrival date & time: 01/28/20  2878     History Chief Complaint  Patient presents with  . Fall    IRIANA Mccoy is a 74 y.o. female.  The history is provided by the patient. No language interpreter was used.  Fall This is a new problem. The current episode started 3 to 5 hours ago. The problem occurs constantly. The problem has been gradually worsening. Pertinent negatives include no chest pain and no headaches. Nothing aggravates the symptoms. Nothing relieves the symptoms. She has tried nothing for the symptoms. The treatment provided moderate relief.  Pt complains of pain in her head and her left foot.  Pt reports she slipped on a stair.  Pt is unsure if she lost consciousness.Pt has a cut on her finger.      Past Medical History:  Diagnosis Date  . Arthritis    "hands sometimes" (07/13/2017)  . Coronary artery disease    a. s/p CABG x3 in 06/2017 with LIMA-LAD, SVG-D1, and SVG-RI.  b. 10/2017: cath showing a widely patent LIMA-LAD with ostial occlusion of the SVG-RI and subtotally occluded atretic SVG-D1. Graft occlusion thought to be 2ry to improvement in pre-CABG stenoses.   . Essential hypertension   . Family history of adverse reaction to anesthesia    sister had a complication that was stated she had a "foggy" episode after her breast surgery was readmitted 1 day post op after being discharged from the hospital. Sister also has significant lung problems that could have contributed to this   . Family history of breast cancer   . Family history of colon cancer   . Hypothyroid   . Meniere disease   . Myocardial infarction ([place]) 06/2017   during cardaic rehab  . Obesity   . Osteopenia 01/2012   T score -1.3 FRAX 7.9%/0.6%    Patient Active Problem List   Diagnosis Date Noted  . Port-A-Cath in place 01/16/2019  . Genetic testing 08/29/2018  . Family history of breast cancer   . Family history of  colon cancer   . Malignant neoplasm of lower-outer quadrant of left breast of female, estrogen receptor negative ([place]) 08/16/2018  . NSTEMI (non-ST elevated myocardial infarction) ([place]) 11/02/2017  . S/P CABG x 3 07/15/2017  . Coronary artery disease 07/15/2017  . Unstable angina ([place]) 07/13/2017  . Essential hypertension 07/13/2017  . Obesity 07/13/2017  . Hyperglycemia 07/13/2017  . Hypothyroid 01/06/2012  . Meniere disease   . Arthritis     Past Surgical History:  Procedure Laterality Date  . BREAST LUMPECTOMY WITH RADIOACTIVE SEED AND SENTINEL LYMPH NODE BIOPSY Left 09/01/2018   Procedure: LEFT BREAST LUMPECTOMY WITH RADIOACTIVE SEED AND SENTINEL LYMPH NODE BIOPSY;  Surgeon: Jovita Kussmaul, MD;  Location: [place];  Service: General;  Laterality: Left;  . BREAST SURGERY    . CARDIAC CATHETERIZATION  07/13/2017  . CATARACT EXTRACTION W/PHACO Right 01/28/2015   Procedure: CATARACT EXTRACTION PHACO AND INTRAOCULAR LENS PLACEMENT :  CDE:  5.70;  Surgeon: Rutherford Guys, MD;  Location: [facility];  Service: Ophthalmology;  Laterality: Right;  . CATARACT EXTRACTION W/PHACO Left 02/11/2015   Procedure: CATARACT EXTRACTION PHACO AND INTRAOCULAR LENS PLACEMENT (IOC);  Surgeon: Rutherford Guys, MD;  Location: [facility];  Service: Ophthalmology;  Laterality: Left;  CDE: 7.38  . COLONOSCOPY N/A 09/26/2012   Procedure: COLONOSCOPY;  Surgeon: Jamesetta So, MD;  [facility];  Service: Gastroenterology;  Laterality: N/A;  .  CORONARY ARTERY BYPASS GRAFT N/A 07/15/2017   Procedure: CORONARY ARTERY BYPASS GRAFTING (CABG) X 3 USING LEFT INTERNAL MAMMARY ARTERY AND RIGHT SAPHENOUS VEIN- ENDOSCOPICALLY HARVESTED;  Surgeon: Melrose Nakayama, MD;  Location: [facility];  Service: Open Heart Surgery;  Laterality: N/A;  . IR IMAGING GUIDED PORT INSERTION  12/29/2018  . IR REMOVAL TUN ACCESS W/ PORT W/O FL MOD SED  04/26/2019  . LEFT HEART CATH AND CORONARY ANGIOGRAPHY N/A 07/13/2017   Procedure: LEFT HEART CATH AND  CORONARY ANGIOGRAPHY;  Surgeon: Martinique, Peter M, MD;  Location: [place] [facility];  Service: Cardiovascular;  Laterality: N/A;  . LEFT HEART CATH AND CORS/GRAFTS ANGIOGRAPHY N/A 11/03/2017   Procedure: LEFT HEART CATH AND CORS/GRAFTS ANGIOGRAPHY;  Surgeon: Troy Sine, MD;  Location: [place] [facility];  Service: Cardiovascular;  Laterality: N/A;  . TEE WITHOUT CARDIOVERSION N/A 07/15/2017   Procedure: TRANSESOPHAGEAL ECHOCARDIOGRAM (TEE);  Surgeon: Melrose Nakayama, MD;  Location: [facility];  Service: Open Heart Surgery;  Laterality: N/A;  . TUBAL LIGATION    . TYMPANOPLASTY Left    fluid from ear drum     OB History    Gravida  2   Para  2   Term  2   Preterm      AB  0   Living  2     SAB      TAB      Ectopic      Multiple      Live Births              Family History  Problem Relation Age of Onset  . Diabetes Sister        AODM  . Heart disease Sister 24       CABG  . Breast cancer Sister   . Heart disease Brother 107       In his 82s  . Colon cancer Maternal Uncle 50  . Diabetes Sister        AODM  . Hypertension Daughter   . Hypertension Daughter   . Breast cancer Cousin        PATERNAL COUSIN  . Heart attack Father        In his 41s  . Stroke Mother   . Stroke Maternal Grandmother   . Diabetes Maternal Grandfather        d. 109  . Heart attack Paternal Grandmother   . Colon cancer Maternal Uncle 61  . Breast cancer Cousin        dx in her 41s; mat first cousin  . Breast cancer Cousin        dx 54s; d. 68s  . Breast cancer Niece 4    Social History   Tobacco Use  . Smoking status: Never Smoker  . Smokeless tobacco: Former Systems developer    Types: Snuff  Vaping Use  . Vaping Use: Never used  Substance Use Topics  . Alcohol use: Yes    Alcohol/week: 1.0 standard drink    Types: 1 Standard drinks or equivalent per week    Comment: occasionally  . Drug use: No    Home Medications Prior to Admission medications   Medication Sig Start  Date End Date Taking? Authorizing Provider  acetaminophen (TYLENOL) 325 MG tablet Take 2 tablets (650 mg total) by mouth every 6 (six) hours as needed for mild pain. 07/20/17   Nani Skillern, PA-C  aspirin EC 81 MG tablet Take 81 mg by mouth daily.  [provider]  atorvastatin (LIPITOR) 80 MG tablet Take 1 tablet (80 mg total) by mouth daily at 6 PM. 01/02/20   Strader, [place] M, PA-C  cetaphil (CETAPHIL) lotion Apply 1 application topically as needed for dry skin. 06/26/19   Gardenia Phlegm, NP  Cholecalciferol (VITAMIN D PO) Take 1 tablet by mouth daily.     [provider]  Cyanocobalamin (VITAMIN B-12 PO) Take 1 tablet by mouth daily.    [provider]  fish oil-omega-3 fatty acids 1000 MG capsule Take 1 g by mouth daily.    [provider]  furosemide (LASIX) 40 MG tablet Take 1 tablet (40 mg total) by mouth daily. 01/02/20   Strader, Fransisco Hertz, PA-C  HYDROcodone-acetaminophen (NORCO/VICODIN) 5-325 MG tablet Take 1-2 tablets by mouth every 6 (six) hours as needed for moderate pain or severe pain. 01/15/19   Hayden Pedro, PA-C  levothyroxine (SYNTHROID) 25 MCG tablet  12/18/18   [provider]  levothyroxine (SYNTHROID, LEVOTHROID) 75 MCG tablet Take 75 mcg by mouth daily.    [provider]  losartan (COZAAR) 25 MG tablet Take 0.5 tablets (12.5 mg total) by mouth daily. 01/02/20 12/27/20  Strader, Fransisco Hertz, PA-C  metoprolol tartrate (LOPRESSOR) 25 MG tablet Take 1 tablet (25 mg total) by mouth 2 (two) times daily. 01/02/20   Strader, Fransisco Hertz, PA-C  mineral oil-hydrophilic petrolatum (AQUAPHOR) ointment Apply topically as needed for dry skin. 06/26/19   Causey, Charlestine Massed, NP  nitroGLYCERIN (NITROSTAT) 0.4 MG SL tablet Place 1 tablet (0.4 mg total) under the tongue every 5 (five) minutes as needed for chest pain. 01/02/20   Strader, Fransisco Hertz, PA-C  vitamin E 100 UNIT capsule Take 1 capsule (100 Units total)  by mouth daily. 03/15/19   Gardenia Phlegm, NP    Allergies    Aloe vera, Sulfa antibiotics, and Sulfasalazine  Review of Systems   Review of Systems  Cardiovascular: Negative for chest pain.  Neurological: Negative for headaches.  All other systems reviewed and are negative.   Physical Exam Updated Vital Signs BP (!) 158/78 (BP Location: Right Arm)   Pulse 69   Temp 98.5 F (36.9 C) (Oral)   Resp 18   Ht 5\' 2"  (1.575 m)   Wt 90.3 kg   SpO2 95%   BMI 36.40 kg/m   Physical Exam Vitals and nursing note reviewed.  Constitutional:      Appearance: She is well-developed.  HENT:     Head: Normocephalic.     Comments: Bruised swollen area right occipital scalp     Mouth/Throat:     Mouth: Mucous membranes are moist.  Cardiovascular:     Rate and Rhythm: Normal rate.  Pulmonary:     Effort: Pulmonary effort is normal.  Abdominal:     General: There is no distension.  Musculoskeletal:        General: Normal range of motion.     Cervical back: Normal range of motion.  Skin:    General: Skin is warm.     Comments: Abrasion left 1st toe   Neurological:     Mental Status: She is alert and oriented to person, place, and time.  Psychiatric:        Mood and Affect: Mood normal.     ED Results / Procedures / Treatments   Labs (all labs ordered are listed, but only abnormal results are displayed) Labs Reviewed - No data to display  EKG None  Radiology CT  Head Wo Contrast  Result Date: 01/28/2020 CLINICAL DATA:  Fall yesterday with trauma to occipital region. EXAM: CT HEAD WITHOUT CONTRAST TECHNIQUE: Contiguous axial images were obtained from the base of the skull through the vertex without intravenous contrast. COMPARISON:  05/30/2009 FINDINGS: Brain: Ventricles, cisterns and other CSF spaces are normal. There is no mass, mass effect, shift of midline structures or acute hemorrhage. No evidence of acute infarction. Evidence of minimal chronic ischemic  microvascular disease. Vascular: No hyperdense vessel or unexpected calcification. Skull: Normal. Negative for fracture or focal lesion. Sinuses/Orbits: No acute finding. Suggestion previous partial left mastoid antrectomy. Other: None. IMPRESSION: No acute findings. Electronically Signed   By: Marin Olp M.D.   On: 01/28/2020 10:30   DG Foot Complete Left  Result Date: 01/28/2020 CLINICAL DATA:  Status post fall. Pain in left foot with laceration at medial aspect of great toe. EXAM: LEFT FOOT - COMPLETE 3+ VIEW COMPARISON:  None. FINDINGS: There is diffuse soft tissue swelling. Plantar and posterior calcaneal heel spurs noted. No fractures or dislocation. IMPRESSION: 1. No acute bone abnormality. 2. Soft tissue swelling. Electronically Signed   By: Kerby Moors M.D.   On: 01/28/2020 10:36    Procedures Procedures (including critical care time)  Medications Ordered in ED Medications - No data to display  ED Course  I have reviewed the triage vital signs and the nursing notes.  Pertinent labs & imaging results that were available during my care of the patient were reviewed by me and considered in my medical decision making (see chart for details).    MDM Rules/Calculators/A&P                          MDM:  Pt p[laced in a dressing.  Pt advised to follow up with her MD for recheck   Final Clinical Impression(s) / ED Diagnoses Final diagnoses:  Fall, initial encounter  Contusion of occipital region of scalp, initial encounter  Contusion of left foot, initial encounter    Rx / DC Orders ED Discharge Orders    None    An After Visit Summary was printed and given to the patient.    Fransico Meadow, [place] 01/28/20 1058    Margette Fast, MD 01/30/20 1945

## 2020-01-28 NOTE — ED Notes (Signed)
Patient has skin tear to left great toe.  Quarter size red area to back of head.

## 2020-01-28 NOTE — ED Notes (Signed)
Patient transported to X-ray 

## 2020-03-28 ENCOUNTER — Other Ambulatory Visit: Payer: Self-pay

## 2020-03-28 NOTE — Patient Outreach (Signed)
[facility]) Care Management  03/28/2020  Gloria Mccoy 1946-03-02 [id number]   Telephone Assessment    RN CM received notification from [facility] administrative assistant that patient called requesting a call on her cell phone([phone]). RN CM made outreach attempt to number and call went straight to voicemail box which has not been set up.      Plan: RN CM will make outreach attempt to patient within the month of Nov if no return call.  Enzo Montgomery, RN,BSN,CCM [facility] Telephonic Care Management Coordinator Direct Phone: [phone] Toll Free: [phone] Fax: [phone]

## 2020-04-15 ENCOUNTER — Other Ambulatory Visit: Payer: Medicare HMO

## 2020-04-15 ENCOUNTER — Ambulatory Visit: Payer: Medicare HMO | Admitting: Adult Health

## 2020-04-16 ENCOUNTER — Other Ambulatory Visit: Payer: Self-pay

## 2020-04-16 ENCOUNTER — Encounter: Payer: Self-pay | Admitting: Adult Health

## 2020-04-16 ENCOUNTER — Inpatient Hospital Stay: Payer: Medicare HMO | Admitting: Adult Health

## 2020-04-16 ENCOUNTER — Inpatient Hospital Stay: Payer: Medicare HMO | Attending: Nurse Practitioner

## 2020-04-16 VITALS — BP 163/71 | HR 65 | Temp 97.3°F | Resp 18 | Ht 62.0 in | Wt 206.5 lb

## 2020-04-16 DIAGNOSIS — I10 Essential (primary) hypertension: Secondary | ICD-10-CM | POA: Diagnosis not present

## 2020-04-16 DIAGNOSIS — I2510 Atherosclerotic heart disease of native coronary artery without angina pectoris: Secondary | ICD-10-CM | POA: Diagnosis not present

## 2020-04-16 DIAGNOSIS — I252 Old myocardial infarction: Secondary | ICD-10-CM | POA: Insufficient documentation

## 2020-04-16 DIAGNOSIS — Z79899 Other long term (current) drug therapy: Secondary | ICD-10-CM | POA: Diagnosis not present

## 2020-04-16 DIAGNOSIS — C50312 Malignant neoplasm of lower-inner quadrant of left female breast: Secondary | ICD-10-CM | POA: Diagnosis not present

## 2020-04-16 DIAGNOSIS — Z171 Estrogen receptor negative status [ER-]: Secondary | ICD-10-CM

## 2020-04-16 DIAGNOSIS — Z8249 Family history of ischemic heart disease and other diseases of the circulatory system: Secondary | ICD-10-CM | POA: Insufficient documentation

## 2020-04-16 DIAGNOSIS — C50512 Malignant neoplasm of lower-outer quadrant of left female breast: Secondary | ICD-10-CM | POA: Diagnosis not present

## 2020-04-16 DIAGNOSIS — Z833 Family history of diabetes mellitus: Secondary | ICD-10-CM | POA: Insufficient documentation

## 2020-04-16 DIAGNOSIS — M25531 Pain in right wrist: Secondary | ICD-10-CM | POA: Diagnosis not present

## 2020-04-16 DIAGNOSIS — Z882 Allergy status to sulfonamides status: Secondary | ICD-10-CM | POA: Diagnosis not present

## 2020-04-16 DIAGNOSIS — Z823 Family history of stroke: Secondary | ICD-10-CM | POA: Insufficient documentation

## 2020-04-16 DIAGNOSIS — Z800 Family history of malignant neoplasm of digestive organs: Secondary | ICD-10-CM | POA: Diagnosis not present

## 2020-04-16 DIAGNOSIS — Z803 Family history of malignant neoplasm of breast: Secondary | ICD-10-CM | POA: Diagnosis not present

## 2020-04-16 LAB — CBC WITH DIFFERENTIAL/PLATELET
Abs Immature Granulocytes: 0.02 10*3/uL (ref 0.00–0.07)
Basophils Absolute: 0.1 10*3/uL (ref 0.0–0.1)
Basophils Relative: 1 %
Eosinophils Absolute: 0.4 10*3/uL (ref 0.0–0.5)
Eosinophils Relative: 4 %
HCT: 36.3 % (ref 36.0–46.0)
Hemoglobin: 13.2 g/dL (ref 12.0–15.0)
Immature Granulocytes: 0 %
Lymphocytes Relative: 26 %
Lymphs Abs: 2.6 10*3/uL (ref 0.7–4.0)
MCH: 31.2 pg (ref 26.0–34.0)
MCHC: 36.4 g/dL — ABNORMAL HIGH (ref 30.0–36.0)
MCV: 85.8 fL (ref 80.0–100.0)
Monocytes Absolute: 1.3 10*3/uL — ABNORMAL HIGH (ref 0.1–1.0)
Monocytes Relative: 13 %
Neutro Abs: 5.8 10*3/uL (ref 1.7–7.7)
Neutrophils Relative %: 56 %
Platelets: 228 10*3/uL (ref 150–400)
RBC: 4.23 MIL/uL (ref 3.87–5.11)
RDW: 13.7 % (ref 11.5–15.5)
WBC: 10.1 10*3/uL (ref 4.0–10.5)
nRBC: 0 % (ref 0.0–0.2)

## 2020-04-16 LAB — COMPREHENSIVE METABOLIC PANEL
ALT: 15 U/L (ref 0–44)
AST: 19 U/L (ref 15–41)
Albumin: 3.6 g/dL (ref 3.5–5.0)
Alkaline Phosphatase: 81 U/L (ref 38–126)
Anion gap: 2 — ABNORMAL LOW (ref 5–15)
BUN: 14 mg/dL (ref 8–23)
CO2: 31 mmol/L (ref 22–32)
Calcium: 9.3 mg/dL (ref 8.9–10.3)
Chloride: 106 mmol/L (ref 98–111)
Creatinine, Ser: 0.98 mg/dL (ref 0.44–1.00)
GFR, Estimated: >60 mL/min (ref >60)
Glucose, Bld: 95 mg/dL (ref 70–99)
Potassium: 3.8 mmol/L (ref 3.5–5.1)
Sodium: 139 mmol/L (ref 135–145)
Total Bilirubin: 0.8 mg/dL (ref 0.3–1.2)
Total Protein: 7.2 g/dL (ref 6.5–8.1)

## 2020-04-16 NOTE — Progress Notes (Signed)
[name]  Telephone:(336) [phone] Fax:([phone]) [phone]    ID: [name] DOB: 25-Jun-1945  MR#: [id number]  JDB#:[id number]  Patient Care Team: Redmond School, MD as PCP - General (Internal Medicine) Harl Bowie, Alphonse Guild, MD as PCP - Cardiology (Cardiology) Magrinat, Virgie Dad, MD as Consulting Physician (Oncology) Kyung Rudd, MD as Consulting Physician (Radiation Oncology) Rutherford Guys, MD as Consulting Physician (Ophthalmology) Jovita Kussmaul, MD as Consulting Physician (General Surgery) Florance, Tomasa Blase, RN as [place] Management OTHER MD:   CHIEF COMPLAINT: Triple negative breast cancer  CURRENT TREATMENT: observation   INTERVAL HISTORY: Gloria Mccoy is here today for follow up of her triple negative breast cancer. She continues under observation.    She underwent bilateral diagnostic mammography with tomography at [facility] on 07/10/2019 showing: breast density category B; no evidence of malignancy in either breast.  She fell in 01/2020 and underwent CT head which was negative.    Neomia is here with her daughter and says that she has no concerns today.    REVIEW OF SYSTEMS: Gloria Mccoy is doing well today.  She has some right wrist pain that is worse in the morning.  She describes it as a stiffness, however it improves as the day goes on.  She is without any other concerns.  She did lose her balance a couple of months ago and fell.  She doesn't exercise regularly, but remains active.  She has had no headaches, dizziness, vision issues, bowel/bladder changes, nausea, vomiting, new pain or any other concerns.  A detailed ROS was otherwise non contributory.     HISTORY OF CURRENT ILLNESS: From the original intake note:  Gloria Mccoy had routine screening mammography on 07/12/2018 showing a possible abnormality in the left breast. She underwent unilateral left diagnostic mammography with tomography and left breast  ultrasonography at [facility] on 07/25/2018 showing: Breast Density Category B. There is a mass with indistinct margins in the lower inner posterior left breast measuring approximately 0.6 cm. Targeted ultrasound of the lower left breast was performed. There is a hypoechoic mass with margin irregularity in the left breast at 7 o'clock, 7 cm from the nipple measuring 0.7 x 0.5 x 0.6 cm. No lymphadenopathy seen in the left axilla.   Accordingly on 08/01/2018 she proceeded to biopsy of the left breast area in question. The pathology from this procedure showed ([id number]): invasive ductal carcinoma, grade II. Prognostic indicators significant for: estrogen receptor, 0% negative and progesterone receptor, 0% negative. Proliferation marker Ki67 at 40%. HER2 negative (1+) by immunohistochemistry.   The patient's subsequent history is as detailed below.   PAST MEDICAL HISTORY: Past Medical History:  Diagnosis Date   Arthritis    "hands sometimes" (07/13/2017)   Coronary artery disease    a. s/p CABG x3 in 06/2017 with LIMA-LAD, SVG-D1, and SVG-RI.  b. 10/2017: cath showing a widely patent LIMA-LAD with ostial occlusion of the SVG-RI and subtotally occluded atretic SVG-D1. Graft occlusion thought to be 2ry to improvement in pre-CABG stenoses.    Essential hypertension    Family history of adverse reaction to anesthesia    sister had a complication that was stated she had a "foggy" episode after her breast surgery was readmitted 1 day post op after being discharged from the hospital. Sister also has significant lung problems that could have contributed to this    Family history of breast cancer    Family history of colon cancer    Hypothyroid  Meniere disease    Myocardial infarction ([place]) 06/2017   during cardaic rehab   Obesity    Osteopenia 01/2012   T score -1.3 FRAX 7.9%/0.6%    PAST SURGICAL HISTORY: Past Surgical History:  Procedure Laterality Date   BREAST LUMPECTOMY  WITH RADIOACTIVE SEED AND SENTINEL LYMPH NODE BIOPSY Left 09/01/2018   Procedure: LEFT BREAST LUMPECTOMY WITH RADIOACTIVE SEED AND SENTINEL LYMPH NODE BIOPSY;  Surgeon: Jovita Kussmaul, MD;  Location: [facility];  Service: General;  Laterality: Left;   BREAST SURGERY     CARDIAC CATHETERIZATION  07/13/2017   CATARACT EXTRACTION W/PHACO Right 01/28/2015   Procedure: CATARACT EXTRACTION PHACO AND INTRAOCULAR LENS PLACEMENT :  CDE:  5.70;  Surgeon: Rutherford Guys, MD;  Location[facility];  Service: Ophthalmology;  Laterality: Right;   CATARACT EXTRACTION W/PHACO Left 02/11/2015   Procedure: CATARACT EXTRACTION PHACO AND INTRAOCULAR LENS PLACEMENT (IOC);  Surgeon: Rutherford Guys, MD;  Location: [facility];  Service: Ophthalmology;  Laterality: Left;  CDE: 7.38   COLONOSCOPY N/A 09/26/2012   Procedure: COLONOSCOPY;  Surgeon: Jamesetta So, MD;  [facility];  Service: Gastroenterology;  Laterality: N/A;   CORONARY ARTERY BYPASS GRAFT N/A 07/15/2017   Procedure: CORONARY ARTERY BYPASS GRAFTING (CABG) X 3 USING LEFT INTERNAL MAMMARY ARTERY AND RIGHT SAPHENOUS VEIN- ENDOSCOPICALLY HARVESTED;  Surgeon: Melrose Nakayama, MD;  Location: [facility];  Service: Open Heart Surgery;  Laterality: N/A;   IR IMAGING GUIDED PORT INSERTION  12/29/2018   IR REMOVAL TUN ACCESS W/ PORT W/O FL MOD SED  04/26/2019   LEFT HEART CATH AND CORONARY ANGIOGRAPHY N/A 07/13/2017   Procedure: LEFT HEART CATH AND CORONARY ANGIOGRAPHY;  Surgeon: Martinique, Peter M, MD;  Location: [facility];  Service: Cardiovascular;  Laterality: N/A;   LEFT HEART CATH AND CORS/GRAFTS ANGIOGRAPHY N/A 11/03/2017   Procedure: LEFT HEART CATH AND CORS/GRAFTS ANGIOGRAPHY;  Surgeon: Troy Sine, MD;  Location: [facility];  Service: Cardiovascular;  Laterality: N/A;   TEE WITHOUT CARDIOVERSION N/A 07/15/2017   Procedure: TRANSESOPHAGEAL ECHOCARDIOGRAM (TEE);  Surgeon: Melrose Nakayama, MD;  Location: [facility];  Service: Open Heart Surgery;   Laterality: N/A;   TUBAL LIGATION     TYMPANOPLASTY Left    fluid from ear drum    FAMILY HISTORY: Family History  Problem Relation Age of Onset   Diabetes Sister        AODM   Heart disease Sister 39       CABG   Breast cancer Sister    Heart disease Brother 47       In his 68s   Colon cancer Maternal Uncle 32   Diabetes Sister        AODM   Hypertension Daughter    Hypertension Daughter    Breast cancer Cousin        PATERNAL COUSIN   Heart attack Father        In his 9s   Stroke Mother    Stroke Maternal Grandmother    Diabetes Maternal Grandfather        d. 55   Heart attack Paternal Grandmother    Colon cancer Maternal Uncle 77   Breast cancer Cousin        dx in her 47s; mat first cousin   Breast cancer Cousin        dx 42s; d. 51s   Breast cancer Niece 45   Khaleelah's father died from a myocardial infarction at age 14.  Patients' mother died from a stroke at age 21. The patient has 3 brothers and 9 sisters. One of Jerrilynn's sisters, [name], was diagnosed with breast cancer at the age of 64. Eren also has a niece that was diagnosed with breast cancer at 40, and two cousins that were diagnosed with breast cancer.  She believes 1 of her brothers may have prostate cancer.  Patient denies anyone in her family having ovarian or pancreatic cancer. Tenesha has an uncle that was diagnosed with colon cancer and an uncle that was diagnosed with "stomach" cancer.    GYNECOLOGIC HISTORY:  No LMP recorded. Patient is postmenopausal. 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 Her husband, Jason Fila, is a retired Administrator. They have two daughters. Their daughter, [name], lives in [place] and is a housewife.  Their other daughter works at [employer]. Tamika had one grandchild  that is now deceased. She attends a [employer].    ADVANCED DIRECTIVES: Maycee's husband, Jason Fila, is automatically her healthcare power of attorney.     HEALTH MAINTENANCE: Social History   Tobacco Use   Smoking status: Never Smoker   Smokeless tobacco: Former Systems developer    Types: Snuff  Vaping Use   Vaping Use: Never used  Substance Use Topics   Alcohol use: Yes    Alcohol/week: 1.0 standard drink    Types: 1 Standard drinks or equivalent per week    Comment: occasionally   Drug use: No    Colonoscopy: yes, Dr. Arnoldo Morale  PAP:   Bone density: yes, 2016, -1.1 osteopenic   Allergies  Allergen Reactions   Aloe Vera Itching    Per patient severe itching   Sulfa Antibiotics Shortness Of Breath   Sulfasalazine Shortness Of Breath    Current Outpatient Medications  Medication Sig Dispense Refill   acetaminophen (TYLENOL) 325 MG tablet Take 2 tablets (650 mg total) by mouth every 6 (six) hours as needed for mild pain.     aspirin EC 81 MG tablet Take 81 mg by mouth daily.     atorvastatin (LIPITOR) 80 MG tablet Take 1 tablet (80 mg total) by mouth daily at 6 PM. 90 tablet 3   cetaphil (CETAPHIL) lotion Apply 1 application topically as needed for dry skin. 236 mL 0   Cholecalciferol (VITAMIN D PO) Take 1 tablet by mouth daily.      Cyanocobalamin (VITAMIN B-12 PO) Take 1 tablet by mouth daily.     fish oil-omega-3 fatty acids 1000 MG capsule Take 1 g by mouth daily.     furosemide (LASIX) 40 MG tablet Take 1 tablet (40 mg total) by mouth daily. 90 tablet 3   levothyroxine (SYNTHROID) 25 MCG tablet      levothyroxine (SYNTHROID, LEVOTHROID) 75 MCG tablet Take 75 mcg by mouth daily.     losartan (COZAAR) 25 MG tablet Take 0.5 tablets (12.5 mg total) by mouth daily. 45 tablet 3   metoprolol tartrate (LOPRESSOR) 25 MG tablet Take 1 tablet (25 mg total) by mouth 2 (two) times daily. 180 tablet 3   mineral oil-hydrophilic petrolatum (AQUAPHOR) ointment Apply  topically as needed for dry skin. 420 g 0   nitroGLYCERIN (NITROSTAT) 0.4 MG SL tablet Place 1 tablet (0.4 mg total) under the tongue every 5 (five) minutes as needed for chest  pain. 25 tablet 3   vitamin E 100 UNIT capsule Take 1 capsule (100 Units total) by mouth daily. 30 capsule 0   No current facility-administered medications for this visit.     OBJECTIVE:  Vitals:   04/16/20 1144  BP: (!) 163/71  Pulse: 65  Resp: 18  Temp: (!) 97.3 F (36.3 C)  SpO2: 93%     Body mass index is 37.77 kg/m.   Wt Readings from Last 3 Encounters:  04/16/20 206 lb 8 oz (93.7 kg)  01/28/20 199 lb (90.3 kg)  01/02/20 199 lb (90.3 kg)  ECOG FS:1 - Symptomatic but completely ambulatory GENERAL: Patient is a well appearing female in no acute distress HEENT:  Sclerae anicteric.  Oropharynx clear and moist. No ulcerations or evidence of oropharyngeal candidiasis. Neck is supple.  NODES:  No cervical, supraclavicular, or axillary lymphadenopathy palpated.  BREAST EXAM:  Left breast s/p lumpectomy and radiation, no sign of local recurrence, right breast benign LUNGS:  Clear to auscultation bilaterally.  No wheezes or rhonchi. HEART:  Regular rate and rhythm. No murmur appreciated. ABDOMEN:  Soft, nontender.  Positive, normoactive bowel sounds. No organomegaly palpated. MSK:  No focal spinal tenderness to palpation. Full range of motion bilaterally in the upper extremities. EXTREMITIES:  No peripheral edema.   SKIN:  Clear with no obvious rashes or skin changes. No nail dyscrasia. NEURO:  Nonfocal. Well oriented.  Appropriate affect.    LAB RESULTS:  CMP     Component Value Date/Time   NA 141 04/26/2019 1338   K 3.6 04/26/2019 1338   CL 107 04/26/2019 1338   CO2 25 04/26/2019 1338   GLUCOSE 96 04/26/2019 1338   BUN 11 04/26/2019 1338   CREATININE 0.85 04/26/2019 1338   CREATININE 0.78 01/16/2019 0840   CALCIUM 9.0 04/26/2019 1338   PROT 6.2 (L) 04/05/2019 1307   ALBUMIN 3.3 (L)  04/05/2019 1307   AST 19 04/05/2019 1307   AST 22 01/16/2019 0840   ALT 12 04/05/2019 1307   ALT 10 01/16/2019 0840   ALKPHOS 72 04/05/2019 1307   BILITOT 0.4 04/05/2019 1307   BILITOT 0.7 01/16/2019 0840   GFRNONAA >60 04/26/2019 1338   GFRNONAA >60 01/16/2019 0840   GFRAA >60 04/26/2019 1338   GFRAA >60 01/16/2019 0840    No results found for: TOTALPROTELP, ALBUMINELP, A1GS, A2GS, BETS, BETA2SER, GAMS, MSPIKE, SPEI  No results found for: KPAFRELGTCHN, LAMBDASER, KAPLAMBRATIO  Lab Results  Component Value Date   WBC 10.1 04/16/2020   NEUTROABS 5.8 04/16/2020   HGB 13.2 04/16/2020   HCT 36.3 04/16/2020   MCV 85.8 04/16/2020   PLT 228 04/16/2020   No results found for: LABCA2  No components found for: MBWGYK599  No results for input(s): INR in the last 168 hours.  No results found for: LABCA2  No results found for: JTT017  No results found for: BLT903  No results found for: ESP233  No results found for: CA2729  No components found for: HGQUANT  No results found for: CEA1 / No results found for: CEA1   No results found for: AFPTUMOR  No results found for: CHROMOGRNA  No results found for: HGBA, HGBA2QUANT, HGBFQUANT, HGBSQUAN (Hemoglobinopathy evaluation)   No results found for: LDH  No results found for: IRON, TIBC, IRONPCTSAT (Iron and TIBC)  No results found for: FERRITIN  Urinalysis    Component Value Date/Time   COLORURINE YELLOW 07/14/2017 2015   APPEARANCEUR HAZY (A) 07/14/2017 2015   LABSPEC 1.011 07/14/2017  2015   PHURINE 6.0 07/14/2017 2015   GLUCOSEU NEGATIVE 07/14/2017 2015   HGBUR NEGATIVE 07/14/2017 2015   BILIRUBINUR NEGATIVE 07/14/2017 2015   KETONESUR NEGATIVE 07/14/2017 2015   PROTEINUR NEGATIVE 07/14/2017 2015   UROBILINOGEN 0.2 07/03/2014 0852   NITRITE NEGATIVE 07/14/2017 2015   LEUKOCYTESUR LARGE (A) 07/14/2017 2015    STUDIES:  No results found.   ELIGIBLE FOR AVAILABLE RESEARCH PROTOCOL: no   ASSESSMENT: 74  y.o. [place], [place] woman status post left breast lower inner quadrant biopsy 08/01/2018 for a clinical T1c N0, stage IB invasive ductal carcinoma, grade 2, triple negative, with an MIB-1 of 40%  (1) genetics testing 08/29/2018 through with Hereditary Gene Panel offered by [facility] found no deleterious mutations in APC, ATM, AXIN2, BARD1, BMPR1A, BRCA1, BRCA2, BRIP1, CDH1, CDK4, CDKN2A (p14ARF), CDKN2A (p16INK4a), CHEK2, CTNNA1, DICER1, EPCAM (Deletion/duplication testing only), GREM1 (promoter region deletion/duplication testing only), KIT, MEN1, MLH1, MSH2, MSH3, MSH6, MUTYH, NBN, NF1, NHTL1, PALB2, PDGFRA, PMS2, POLD1, POLE, PTEN, RAD50, RAD51C, RAD51D, SDHB, SDHC, SDHD, SMAD4, SMARCA4. STK11, TP53, TSC1, TSC2, and VHL.  The following genes were evaluated for sequence changes only: SDHA and HOXB13 c.251G>A variant only.   (a) MLH1 c.1890T>G VUS identified  (2) status post left lumpectomy with sentinel lymph node sampling 09/01/2018 for a pT1c pN0, stage IB invasive ductal carcinoma, grade 2, with negative margins.  (a) a total of 3 sentinel lymph nodes were removed  (3) chemotherapy consisting of cyclophosphamide, methotrexate, and fluorouracil (CMF), repeated every 21 days x 8, from 10/24/2018-04/05/2019  (4) adjuvant radiation given concurrently with cycles 3-4-5 of chemotherapy from 12/05/2018-01/19/2019.  The left breast was treated to 50.4 Gy in 28 fractions, followed by a 10 Gy boost to the lumpectomy cavity.   PLAN:  Gloria Mccoy is doing well today.  She has no signs of breast cancer recurrence.  This is favorable.  I counseled Kelis that in cases of patients with triple negative breast cancer, it is more common for recurrence to happen early.  I encouraged her to keep her f/u appointments with [place].    Caili will undergo repeat diagnostic mammogram in 06/2020.  She was recommended to continue to f/u with her PCP for her health maintenance.    We discussed healthy diet and exercise  recommendations in detail.  Shinika will return in 6 months for labs and f/u.  She knows to call for any questions that may arise between now and her next appointment.  We are happy to see her sooner if needed.    Total encounter time: 30 minutes*  Wilber Bihari, NP 04/16/20 12:01 PM Medical Oncology and Hematology [facility] [place], [place] [address] Tel. [phone]    Fax. [phone]  *Total Encounter Time as defined by the Centers for Medicare and Medicaid Services includes, in addition to the face-to-face time of a patient visit (documented in the note above) non-face-to-face time: obtaining and reviewing outside history, ordering and reviewing medications, tests or procedures, care coordination (communications with other health care professionals or caregivers) and documentation in the medical record.

## 2020-04-30 ENCOUNTER — Other Ambulatory Visit: Payer: Self-pay

## 2020-04-30 NOTE — Patient Outreach (Signed)
[facility]) Care Management  04/30/2020  KEANDRA MEDERO 1945-11-27 [id number]   Telephone Assessment    Unsuccessful outreach attempt to patient at both numbers listed for patient.      Plan: RN CM will make  outreach attempt to patient within the month of Dec  if no return call from patient.  Enzo Montgomery, RN,BSN,CCM [facility] Management Telephonic Care Management Coordinator Direct Phone: [phone] Toll Free: [phone] Fax: [phone]

## 2020-05-05 ENCOUNTER — Ambulatory Visit: Payer: Self-pay

## 2020-05-11 ENCOUNTER — Other Ambulatory Visit: Payer: Self-pay | Admitting: Cardiology

## 2020-05-28 DIAGNOSIS — Z961 Presence of intraocular lens: Secondary | ICD-10-CM | POA: Diagnosis not present

## 2020-05-28 DIAGNOSIS — H524 Presbyopia: Secondary | ICD-10-CM | POA: Diagnosis not present

## 2020-06-04 ENCOUNTER — Other Ambulatory Visit: Payer: Self-pay

## 2020-06-04 NOTE — Patient Outreach (Signed)
[place]) Care Management  06/04/2020  Gloria Mccoy Nov 17, 1945 [id number]   Telephone Assessment   Unsuccessful outreach attempt to patient.      Plan: RN CM will make quarterly outreach attempt to patient within the month of Feb if no return call form patient. RN CM will send unsuccessful outreach letter to patient.   Enzo Montgomery, RN,BSN,CCM [place] Management Telephonic Care Management Coordinator Direct Phone: [phone] Toll Free: [phone] Fax: [phone]

## 2020-06-10 ENCOUNTER — Ambulatory Visit: Payer: Self-pay

## 2020-06-18 ENCOUNTER — Other Ambulatory Visit (HOSPITAL_COMMUNITY): Payer: Self-pay | Admitting: Internal Medicine

## 2020-06-18 DIAGNOSIS — Z9889 Other specified postprocedural states: Secondary | ICD-10-CM

## 2020-07-15 ENCOUNTER — Telehealth: Payer: Self-pay | Admitting: Student

## 2020-07-15 ENCOUNTER — Encounter (HOSPITAL_COMMUNITY): Payer: Self-pay

## 2020-07-15 ENCOUNTER — Encounter (HOSPITAL_COMMUNITY): Payer: Medicare HMO

## 2020-07-15 ENCOUNTER — Ambulatory Visit (HOSPITAL_COMMUNITY): Payer: Medicare HMO

## 2020-07-15 DIAGNOSIS — M25511 Pain in right shoulder: Secondary | ICD-10-CM | POA: Diagnosis not present

## 2020-07-15 DIAGNOSIS — M25531 Pain in right wrist: Secondary | ICD-10-CM | POA: Diagnosis not present

## 2020-07-15 DIAGNOSIS — M5030 Other cervical disc degeneration, unspecified cervical region: Secondary | ICD-10-CM | POA: Diagnosis not present

## 2020-07-15 DIAGNOSIS — Z6835 Body mass index (BMI) 35.0-35.9, adult: Secondary | ICD-10-CM | POA: Diagnosis not present

## 2020-07-15 NOTE — Telephone Encounter (Signed)
Pt was given 10 days of steroids from PCP. Pt daughter states that arthritis is starting to impact pt ADL's. They are asking if patient may have advil or aleve for pain. Please advise.

## 2020-07-15 NOTE — Telephone Encounter (Signed)
New message      Daughter in lobby waiting -  Her PCP wants her to take advil or aleve because she is having a lot of pain from arthritis - she is on a strict tylenol regimen.

## 2020-07-17 NOTE — Telephone Encounter (Signed)
Returned call to patient and daughter, no answer. Left msg on pt voice mail.

## 2020-07-17 NOTE — Telephone Encounter (Signed)
With her history of heat disease would need to avoid NSAIDs like adville. Tylenol safest thing, for any other alternatives would have to defer to pcp  Zandra Abts MD

## 2020-07-18 NOTE — Telephone Encounter (Signed)
Contacted patients daughter and relayed the information from Dr. Harl Bowie. She verbalized understanding and said they would contact PCP if Tylenol did not help with patients pain.

## 2020-07-22 ENCOUNTER — Other Ambulatory Visit: Payer: Self-pay

## 2020-07-22 NOTE — Patient Outreach (Addendum)
[facility] [facility]) Care Management  07/22/2020  Gloria Mccoy [birth year] [id number]   Telephone Assessment    Multiple attempts to establish contact with patient. No successful outreach for over a year. No response from letter mailed to patient.  Plan: RN CM will proceed with case closure.   Enzo Montgomery, RN,BSN,CCM [place] Management Telephonic Care Management Coordinator Direct Phone: [phone] Toll Free: [phone] Fax: [phone]

## 2020-08-01 ENCOUNTER — Ambulatory Visit: Payer: Self-pay

## 2020-08-05 ENCOUNTER — Other Ambulatory Visit: Payer: Self-pay

## 2020-08-05 ENCOUNTER — Ambulatory Visit (HOSPITAL_COMMUNITY)
Admission: RE | Admit: 2020-08-05 | Discharge: 2020-08-05 | Disposition: A | Discharge status: Home / Self Care | Payer: Medicare HMO | Source: Ambulatory Visit | Attending: Internal Medicine | Admitting: Internal Medicine

## 2020-08-05 ENCOUNTER — Other Ambulatory Visit (HOSPITAL_COMMUNITY): Payer: Medicare HMO

## 2020-08-05 DIAGNOSIS — Z9889 Other specified postprocedural states: Secondary | ICD-10-CM | POA: Diagnosis not present

## 2020-08-05 DIAGNOSIS — R922 Inconclusive mammogram: Secondary | ICD-10-CM | POA: Diagnosis not present

## 2020-08-05 DIAGNOSIS — Z853 Personal history of malignant neoplasm of breast: Secondary | ICD-10-CM | POA: Insufficient documentation

## 2020-08-29 ENCOUNTER — Telehealth (INDEPENDENT_AMBULATORY_CARE_PROVIDER_SITE_OTHER): Payer: Medicare HMO | Admitting: Cardiology

## 2020-08-29 ENCOUNTER — Encounter: Payer: Self-pay | Admitting: Cardiology

## 2020-08-29 ENCOUNTER — Other Ambulatory Visit: Payer: Self-pay

## 2020-08-29 VITALS — BP 146/76 | HR 68 | Ht 62.0 in | Wt 196.0 lb

## 2020-08-29 DIAGNOSIS — I2510 Atherosclerotic heart disease of native coronary artery without angina pectoris: Secondary | ICD-10-CM | POA: Diagnosis not present

## 2020-08-29 DIAGNOSIS — E782 Mixed hyperlipidemia: Secondary | ICD-10-CM | POA: Diagnosis not present

## 2020-08-29 DIAGNOSIS — I10 Essential (primary) hypertension: Secondary | ICD-10-CM | POA: Diagnosis not present

## 2020-08-29 NOTE — Patient Instructions (Signed)
Medication Instructions:  Your physician recommends that you continue on your current medications as directed. Please refer to the Current Medication list given to you today.  *If you need a refill on your cardiac medications before your next appointment, please call your pharmacy*   Lab Work: NONE   If you have labs (blood work) drawn today and your tests are completely normal, you will receive your results only by: Marland Kitchen MyChart Message (if you have MyChart) OR . A paper copy in the mail If you have any lab test that is abnormal or we need to change your treatment, we will call you to review the results.   Testing/Procedures: Your physician has requested that you regularly monitor and record your blood pressure readings at home. Please use the same machine at the same time of day to check your readings and record them to bring to your follow-up visit.   Follow-Up: At [facility], you and your health needs are our priority.  As part of our continuing mission to provide you with exceptional heart care, we have created designated Provider Care Teams.  These Care Teams include your primary Cardiologist (physician) and Advanced Practice Providers (APPs -  Physician Assistants and Nurse Practitioners) who all work together to provide you with the care you need, when you need it.  We recommend signing up for the patient portal called "MyChart".  Sign up information is provided on this After Visit Summary.  MyChart is used to connect with patients for Virtual Visits (Telemedicine).  Patients are able to view lab/test results, encounter notes, upcoming appointments, etc.  Non-urgent messages can be sent to your provider as well.   To learn more about what you can do with MyChart, go to [email or web address].    Your next appointment:   6 month(s)  The format for your next appointment:   In Person  Provider:   Carlyle Dolly, MD   Other Instructions Thank you for choosing [facility]!

## 2020-08-29 NOTE — Progress Notes (Signed)
Virtual Visit via Telephone Note   This visit type was conducted due to national recommendations for restrictions regarding the COVID-19 Pandemic (e.g. social distancing) in an effort to limit this patient's exposure and mitigate transmission in our community.  Due to her co-morbid illnesses, this patient is at least at moderate risk for complications without adequate follow up.  This format is felt to be most appropriate for this patient at this time.  The patient did not have access to video technology/had technical difficulties with video requiring transitioning to audio format only (telephone).  All issues noted in this document were discussed and addressed.  No physical exam could be performed with this format.  Please refer to the patient's chart for her  consent to telehealth for [facility].    Date:  08/29/2020   ID:  NOTNAMED CROUCHER, DOB [birth year], MRN [id number] The patient was identified using 2 identifiers.  Patient Location: Home Provider Location: Office/Clinic   PCP:  Redmond School, [place]  Cardiologist:  Carlyle Dolly, MD  Advanced Practice Provider:  No care team member to display Electrophysiologist:  None  :[id number]}   Evaluation Performed:  Follow-Up Visit  Chief Complaint:  FOllow up  History of Present Illness:    AILYNE PAWLEY is a 75 y.o. female seen today for follow up of the following medical problems.  1. CAD - admit Jan 2019 with unstable angina. - Jan 2019 with distal LM 80%, prox LAD 90%. She was referred for CABG/ s/p LIMA-LAD, SVG-D1, SVG-RI - Jan 2019 echo LVEF 55-60%  - 10/2017 admitted with chest pain. Admitted with NSTEMI - 10/2017 cath with ostial LAD 45%, mid LAD 80%, D1 40%, ramus 20%, LCX 25%, RCA patent. LIMA-LAD patent, SVG-ramus occluded, SVG-D1 99%. Recs for medical therapy, discharged on DAPT with plans for 1 year of treatment.     - compliant with meds - no recent chest  pains   2. Hyperlipidemia  - at 12/2019 visit had run out of atorvastatin, was restarted at that time.  - reports recent labs with pcp  3. HTN She is compliant with meds   4. Breast cancer - s/p lumpectomy -she is on chemoradiation    The patient does not have symptoms concerning for COVID-19 infection (fever, chills, cough, or new shortness of breath).    Past Medical History:  Diagnosis Date  . Arthritis    "hands sometimes" (07/13/2017)  . Coronary artery disease    a. s/p CABG x3 in 06/2017 with LIMA-LAD, SVG-D1, and SVG-RI.  b. 10/2017: cath showing a widely patent LIMA-LAD with ostial occlusion of the SVG-RI and subtotally occluded atretic SVG-D1. Graft occlusion thought to be 2ry to improvement in pre-CABG stenoses.   . Essential hypertension   . Family history of adverse reaction to anesthesia    sister had a complication that was stated she had a "foggy" episode after her breast surgery was readmitted 1 day post op after being discharged from the hospital. Sister also has significant lung problems that could have contributed to this   . Family history of breast cancer   . Family history of colon cancer   . Hypothyroid   . Meniere disease   . Myocardial infarction ([place]) 06/2017   during cardaic rehab  . Obesity   . Osteopenia 01/2012   T score -1.3 FRAX 7.9%/0.6%   Past Surgical History:  Procedure Laterality Date  . BREAST LUMPECTOMY WITH RADIOACTIVE SEED AND SENTINEL LYMPH  NODE BIOPSY Left 09/01/2018   Procedure: LEFT BREAST LUMPECTOMY WITH RADIOACTIVE SEED AND SENTINEL LYMPH NODE BIOPSY;  Surgeon: Jovita Kussmaul, MD;  Location: [place];  Service: General;  Laterality: Left;  . BREAST SURGERY    . CARDIAC CATHETERIZATION  07/13/2017  . CATARACT EXTRACTION W/PHACO Right 01/28/2015   Procedure: CATARACT EXTRACTION PHACO AND INTRAOCULAR LENS PLACEMENT :  CDE:  5.70;  Surgeon: Rutherford Guys, MD;  [facility];  Service: Ophthalmology;  Laterality: Right;  .  CATARACT EXTRACTION W/PHACO Left 02/11/2015   Procedure: CATARACT EXTRACTION PHACO AND INTRAOCULAR LENS PLACEMENT (IOC);  Surgeon: Rutherford Guys, MD;  Location: [facility];  Service: Ophthalmology;  Laterality: Left;  CDE: 7.38  . COLONOSCOPY N/A 09/26/2012   Procedure: COLONOSCOPY;  Surgeon: Jamesetta So, MD;  [facility];  Service: Gastroenterology;  Laterality: N/A;  . CORONARY ARTERY BYPASS GRAFT N/A 07/15/2017   Procedure: CORONARY ARTERY BYPASS GRAFTING (CABG) X 3 USING LEFT INTERNAL MAMMARY ARTERY AND RIGHT SAPHENOUS VEIN- ENDOSCOPICALLY HARVESTED;  Surgeon: Melrose Nakayama, MD;  Location: [place];  Service: Open Heart Surgery;  Laterality: N/A;  . IR IMAGING GUIDED PORT INSERTION  12/29/2018  . IR REMOVAL TUN ACCESS W/ PORT W/O FL MOD SED  04/26/2019  . LEFT HEART CATH AND CORONARY ANGIOGRAPHY N/A 07/13/2017   Procedure: LEFT HEART CATH AND CORONARY ANGIOGRAPHY;  Surgeon: Martinique, Peter M, MD;  Location: [place] [facility];  Service: Cardiovascular;  Laterality: N/A;  . LEFT HEART CATH AND CORS/GRAFTS ANGIOGRAPHY N/A 11/03/2017   Procedure: LEFT HEART CATH AND CORS/GRAFTS ANGIOGRAPHY;  Surgeon: Troy Sine, MD;  Location: [place] [facility];  Service: Cardiovascular;  Laterality: N/A;  . TEE WITHOUT CARDIOVERSION N/A 07/15/2017   Procedure: TRANSESOPHAGEAL ECHOCARDIOGRAM (TEE);  Surgeon: Melrose Nakayama, MD;  Location: [facility];  Service: Open Heart Surgery;  Laterality: N/A;  . TUBAL LIGATION    . TYMPANOPLASTY Left    fluid from ear drum     No outpatient medications have been marked as taking for the 08/29/20 encounter (Appointment) with Arnoldo Lenis, MD.     Allergies:   Aloe vera, Sulfa antibiotics, and Sulfasalazine   Social History   Tobacco Use  . Smoking status: Never Smoker  . Smokeless tobacco: Former Systems developer    Types: Snuff  Vaping Use  . Vaping Use: Never used  Substance Use Topics  . Alcohol use: Yes    Alcohol/week: 1.0 standard drink    Types: 1  Standard drinks or equivalent per week    Comment: occasionally  . Drug use: No     Family Hx: The patient's family history includes Breast cancer in her cousin, cousin, cousin, and sister; Breast cancer (age of onset: 74) in her niece; Colon cancer (age of onset: 99) in her maternal uncle; Colon cancer (age of onset: 11) in her maternal uncle; Diabetes in her maternal grandfather, sister, and sister; Heart attack in her father and paternal grandmother; Heart disease (age of onset: 104) in her sister; Heart disease (age of onset: 73) in her brother; Hypertension in her daughter and daughter; Stroke in her maternal grandmother and mother.  ROS:   Please see the history of present illness.     All other systems reviewed and are negative.   Prior CV studies:   The following studies were reviewed today:  Jan 2019 cath  Mid LM lesion is 25% stenosed.  Dist LM to Ost LAD lesion is 80% stenosed.  Ost Cx  to Prox Cx lesion is 30% stenosed.  Ost 1st Mrg lesion is 50% stenosed.  Prox LAD lesion is 90% stenosed.  The left ventricular systolic function is normal.  LV end diastolic pressure is normal.  The left ventricular ejection fraction is 55-65% by visual estimate.  1. Single vessel obstructive CAD. Patient has complex ostial and proximal to mid LAD disease that involves the origin of the ramus intermediate and first diagonal respectively. 2. Normal LV function 3. Normal LVEDP  Plan: given complexity of lesions with ostial and bifurcation LAD disease I would recommend consideration for CABG.  Jan 2019 Carotid US Final Interpretation: Right Carotid: Velocities in the right ICA are consistent with a 1-39% stenosis.  Left Carotid: Velocities in the left ICA are consistent with a 1-39% stenosis. Vertebrals: Both vertebral arteries were patent with antegrade flow. Subclavians:  Jan 2019 echo Study Conclusions  - Left ventricle: The cavity size was normal. Wall thickness  was increased in a pattern of mild LVH. Systolic function was normal. The estimated ejection fraction was in the range of 55% to 60%. Wall motion was normal; there were no regional wall motion abnormalities. Doppler parameters are consistent with abnormal left ventricular relaxation (grade 1 diastolic dysfunction). - Mitral valve: Valve area by pressure half-time: 1.26 cm^2.  Impressions:  - Normal LV systolic function; mild LVH; mild diastolic dysfunction.  10/2017 cath  Ost 1st Diag to 1st Diag lesion is 40% stenosed.  Ost Cx lesion is 25% stenosed.  Ost Ramus lesion is 20% stenosed.  Ost LAD lesion is 45% stenosed.  Prox LAD to Mid LAD lesion is 80% stenosed.  Origin lesion is 100% stenosed.  Origin lesion is 99% stenosed.  Origin to Dist Graft lesion is 99% stenosed.  Dist Graft lesion is 100% stenosed.  LV end diastolic pressure is mildly elevated.  The left ventricular systolic function is normal.  Hyperdynamic LV function with an EF of 65% without focal segmental wall motion abnormalities.  Native coronary obstructive disease with 40% ostial LAD stenosis, diffuse 40 to 50% proximal diagonal stenosis with 80% LAD stenosis diffusely after the diagonal takeoff and evidence for competitive filling to the mid LAD via the LIMA graft; 25% ostial smooth narrowing in the ramus intermediate vessel; 25% ostial smooth narrowing in the left circumflex vessel; normal RCA.  Widely patent LIMA graft which supplies the mid LAD.  Totally ostial occlusion of the SVG which had supplied the ramus intermediate vessel.  Subtotally occluded ostial vein graft with diffuse 99% narrowing insistent with an atretic graft which does not fill the diagonal vessel.  POST CATHRECOMMENDATION: Medical therapy. I suspect the early vein graft occlusion was contributed by improvement in the prior pre-CABG stenoses.  Labs/Other Tests and Data Reviewed:    EKG:  No ECG  reviewed.  Recent Labs: 04/16/2020: ALT 15; BUN 14; Creatinine, Ser 0.98; Hemoglobin 13.2; Platelets 228; Potassium 3.8; Sodium 139   Recent Lipid Panel Lab Results  Component Value Date/Time   CHOL 79 11/18/2017 09:35 AM   TRIG 52 11/18/2017 09:35 AM   HDL 25 (L) 11/18/2017 09:35 AM   CHOLHDL 3.2 11/18/2017 09:35 AM   LDLCALC 44 11/18/2017 09:35 AM    Wt Readings from Last 3 Encounters:  04/16/20 206 lb 8 oz (93.7 kg)  01/28/20 199 lb (90.3 kg)  01/02/20 199 lb (90.3 kg)     Risk Assessment/Calculations:      Objective:    Vital Signs:  Today's Vitals   08/29/20 0830  BP: Marland Kitchen)  146/76  Pulse: 68  Weight: 196 lb (88.9 kg)  Height: 5\' 2"  (1.575 m)   Body mass index is 35.85 kg/m. NOrmal affect. Normal speech pattern and tone. Comfortable, no apparent distress. No audible signs of sob or wheezing.   ASSESSMENT & PLAN:    1. CAD -no recent symptoms, continue current meds  2. Hyperlipidemia -continue statin, request pcp labs  3. HTN - elevated today, just took meds this AM - she will call Monday with update on home bp's, room to titrate losartan if needed        COVID-19 Education: The signs and symptoms of COVID-19 were discussed with the patient and how to seek care for testing (follow up with PCP or arrange E-visit).  The importance of social distancing was discussed today.  Time:   Today, I have spent 17 minutes with the patient with telehealth technology discussing the above problems.     Medication Adjustments/Labs and Tests Ordered: Current medicines are reviewed at length with the patient today.  Concerns regarding medicines are outlined above.   Tests Ordered: No orders of the defined types were placed in this encounter.   Medication Changes: No orders of the defined types were placed in this encounter.   Follow Up:  In Person in 6 month(s)  Signed, Carlyle Dolly, MD  08/29/2020 8:00 AM    [place]

## 2020-09-19 ENCOUNTER — Encounter: Payer: Self-pay | Admitting: Cardiology

## 2020-10-13 NOTE — Progress Notes (Signed)
Office Visit Note  Patient: Gloria Mccoy             Date of Birth: [birth year]           MRN: [id number]             PCP: Redmond School, MD Referring: Ginger Organ Visit Date: 10/27/2020 Occupation: @GUAROCC @  Subjective:  Pain in multiple joints.   History of Present Illness: Gloria Mccoy is a 75 y.o. female seen in consultation per request of her PCP.  According to the patient her symptoms a started in February 2021 after getting the COVID-19 vaccination.  She states the pain started in her right shoulder, and then moves down to her right hand and right wrist joint.  The right shoulder joint pain improved but symptoms moved to her left arm.  She has been having discomfort in her left shoulder joint, left elbow, left wrist and left hand.  She also has neck pain.  She denies discomfort in her lower extremities or any other joints.  She has not seen any visible swelling.  She takes Tylenol for pain.  No history of oral ulcers, nasal ulcers, malar rash, photosensitivity, Raynaud's phenomenon, lymphadenopathy.  She has a sister with rheumatoid arthritis. She is gravida 2, para 2, miscarriages 0.  Activities of Daily Living:  Patient reports morning stiffness for 30-40 minutes.   Patient Reports nocturnal pain.  Difficulty dressing/grooming: Reports Difficulty climbing stairs: Reports Difficulty getting out of chair: Reports Difficulty using hands for taps, buttons, cutlery, and/or writing: Reports  Review of Systems  Constitutional: Positive for fatigue. Negative for night sweats, weight gain and weight loss.  HENT: Negative for mouth sores, trouble swallowing, trouble swallowing, mouth dryness and nose dryness.   Eyes: Negative for pain, redness, itching, visual disturbance and dryness.  Respiratory: Negative for cough, shortness of breath and difficulty breathing.   Cardiovascular: Negative for chest pain, palpitations, hypertension, irregular heartbeat and swelling  in legs/feet.  Gastrointestinal: Negative for blood in stool, constipation and diarrhea.  Endocrine: Negative for increased urination.  Genitourinary: Negative for difficulty urinating and vaginal dryness.  Musculoskeletal: Positive for arthralgias, joint pain, myalgias, morning stiffness, muscle tenderness and myalgias. Negative for joint swelling and muscle weakness.  Skin: Negative for color change, rash, hair loss, redness, skin tightness, ulcers and sensitivity to sunlight.  Allergic/Immunologic: Negative for susceptible to infections.  Neurological: Positive for numbness and memory loss. Negative for dizziness, headaches, night sweats and weakness.  Hematological: Negative for bruising/bleeding tendency and swollen glands.  Psychiatric/Behavioral: Negative for depressed mood, confusion and sleep disturbance. The patient is not nervous/anxious.     PMFS History:  Patient Active Problem List   Diagnosis Date Noted  . Port-A-Cath in place 01/16/2019  . Genetic testing 08/29/2018  . Family history of breast cancer   . Family history of colon cancer   . Malignant neoplasm of lower-outer quadrant of left breast of female, estrogen receptor negative ([place]) 08/16/2018  . NSTEMI (non-ST elevated myocardial infarction) ([place]) 11/02/2017  . S/P CABG x 3 07/15/2017  . Coronary artery disease 07/15/2017  . Unstable angina ([place]) 07/13/2017  . Essential hypertension 07/13/2017  . Obesity 07/13/2017  . Hyperglycemia 07/13/2017  . Hypothyroid 01/06/2012  . Meniere disease   . Arthritis     Past Medical History:  Diagnosis Date  . Arthritis    "hands sometimes" (07/13/2017)  . Coronary artery disease    a. s/p CABG x3 in 06/2017 with LIMA-LAD, SVG-D1, and  SVG-RI.  b. 10/2017: cath showing a widely patent LIMA-LAD with ostial occlusion of the SVG-RI and subtotally occluded atretic SVG-D1. Graft occlusion thought to be 2ry to improvement in pre-CABG stenoses.   . Essential hypertension   .  Family history of adverse reaction to anesthesia    sister had a complication that was stated she had a "foggy" episode after her breast surgery was readmitted 1 day post op after being discharged from the hospital. Sister also has significant lung problems that could have contributed to this   . Family history of breast cancer   . Family history of colon cancer   . Hypothyroid   . Meniere disease   . Myocardial infarction ([place]) 06/2017   during cardaic rehab  . Obesity   . Osteopenia 01/2012   T score -1.3 FRAX 7.9%/0.6%    Family History  Problem Relation Age of Onset  . Diabetes Sister        AODM  . Heart disease Sister 48       CABG  . Breast cancer Sister   . Heart disease Brother 74       In his 75s  . Colon cancer Maternal Uncle 29  . Diabetes Sister        AODM  . Hypertension Daughter   . Hypertension Daughter   . Heart attack Father        In his 23s  . Stroke Mother   . Stroke Maternal Grandmother   . Diabetes Maternal Grandfather        d. 109  . Heart attack Paternal Grandmother   . Colon cancer Maternal Uncle 49  . Breast cancer Cousin        dx 53s; d. 42s  . Breast cancer Niece 99   Past Surgical History:  Procedure Laterality Date  . BREAST LUMPECTOMY WITH RADIOACTIVE SEED AND SENTINEL LYMPH NODE BIOPSY Left 09/01/2018   Procedure: LEFT BREAST LUMPECTOMY WITH RADIOACTIVE SEED AND SENTINEL LYMPH NODE BIOPSY;  Surgeon: Jovita Kussmaul, MD;  Location: [facility];  Service: General;  Laterality: Left;  . BREAST SURGERY    . CARDIAC CATHETERIZATION  07/13/2017  . CATARACT EXTRACTION W/PHACO Right 01/28/2015   Procedure: CATARACT EXTRACTION PHACO AND INTRAOCULAR LENS PLACEMENT :  CDE:  5.70;  Surgeon: Rutherford Guys, MD;  [facility];  Service: Ophthalmology;  Laterality: Right;  . CATARACT EXTRACTION W/PHACO Left 02/11/2015   Procedure: CATARACT EXTRACTION PHACO AND INTRAOCULAR LENS PLACEMENT (IOC);  Surgeon: Rutherford Guys, MD;  [facility];  Service:  Ophthalmology;  Laterality: Left;  CDE: 7.38  . COLONOSCOPY N/A 09/26/2012   Procedure: COLONOSCOPY;  Surgeon: Jamesetta So, MD;  [facility];  Service: Gastroenterology;  Laterality: N/A;  . CORONARY ARTERY BYPASS GRAFT N/A 07/15/2017   Procedure: CORONARY ARTERY BYPASS GRAFTING (CABG) X 3 USING LEFT INTERNAL MAMMARY ARTERY AND RIGHT SAPHENOUS VEIN- ENDOSCOPICALLY HARVESTED;  Surgeon: Melrose Nakayama, MD;  Location: [facility];  Service: Open Heart Surgery;  Laterality: N/A;  . IR IMAGING GUIDED PORT INSERTION  12/29/2018  . IR REMOVAL TUN ACCESS W/ PORT W/O FL MOD SED  04/26/2019  . LEFT HEART CATH AND CORONARY ANGIOGRAPHY N/A 07/13/2017   Procedure: LEFT HEART CATH AND CORONARY ANGIOGRAPHY;  Surgeon: Martinique, Peter M, MD;  Location: [facility];  Service: Cardiovascular;  Laterality: N/A;  . LEFT HEART CATH AND CORS/GRAFTS ANGIOGRAPHY N/A 11/03/2017   Procedure: LEFT HEART CATH AND CORS/GRAFTS ANGIOGRAPHY;  Surgeon: Shelva Majestic  A, MD;  Location: [facility];  Service: Cardiovascular;  Laterality: N/A;  . TEE WITHOUT CARDIOVERSION N/A 07/15/2017   Procedure: TRANSESOPHAGEAL ECHOCARDIOGRAM (TEE);  Surgeon: Melrose Nakayama, MD;  Location: [facility];  Service: Open Heart Surgery;  Laterality: N/A;  . TUBAL LIGATION    . TYMPANOPLASTY Left    fluid from ear drum   Social History   Social History Narrative   ** Merged History Encounter **       Immunization History  Administered Date(s) Administered  . Influenza, High Dose Seasonal PF 03/08/2018, 02/14/2019  . Moderna Sars-Covid-2 Vaccination 07/26/2019, 08/24/2019, 05/01/2020  . Pneumococcal Polysaccharide-23 03/08/2018  . Tdap 12/03/2018  . Zoster Recombinat (Shingrix) 02/14/2019     Objective: Vital Signs: BP 140/73 (BP Location: Right Arm, Patient Position: Sitting, Cuff Size: Normal)   Pulse (!) 59   Resp 16   Ht 5\' 3"  (1.6 m)   Wt 206 lb 9.6 oz (93.7 kg)   BMI 36.60 kg/m    Physical Exam Vitals and  nursing note reviewed.  Constitutional:      Appearance: She is well-developed.  HENT:     Head: Normocephalic and atraumatic.  Eyes:     Conjunctiva/sclera: Conjunctivae normal.  Cardiovascular:     Rate and Rhythm: Normal rate and regular rhythm.     Heart sounds: Normal heart sounds.  Pulmonary:     Effort: Pulmonary effort is normal.     Breath sounds: Normal breath sounds.  Abdominal:     General: Bowel sounds are normal.     Palpations: Abdomen is soft.  Musculoskeletal:     Cervical back: Normal range of motion.  Lymphadenopathy:     Cervical: No cervical adenopathy.  Skin:    General: Skin is warm and dry.     Capillary Refill: Capillary refill takes less than 2 seconds.  Neurological:     Mental Status: She is alert and oriented to person, place, and time.  Psychiatric:        Behavior: Behavior normal.      Musculoskeletal Exam: She has some stiffness with range of motion of her cervical spine.  Although she had good range of motion of her cervical spine.  She had discomfort range of motion of her left shoulder wrist joint which she describes over the left arm.  Elbow joints with good range of motion with no synovitis.  Wrist joints with good range of motion with no synovitis.  There was no synovitis of her bilateral MCP joints and no tenderness.  She has bilateral PIP and DIP thickening.  Hip joints and knee joints in good range of motion.  She had no tenderness over ankles or MTPs.  CDAI Exam: CDAI Score: -- Patient Global: --; Provider Global: -- Swollen: --; Tender: -- Joint Exam 10/27/2020   No joint exam has been documented for this visit   There is currently no information documented on the homunculus. Go to the Rheumatology activity and complete the homunculus joint exam.  Investigation: No additional findings.  Imaging: No results found.  Recent Labs: Lab Results  Component Value Date   WBC 9.3 10/16/2020   HGB 13.2 10/16/2020   PLT 224  10/16/2020   NA 143 10/16/2020   K 4.1 10/16/2020   CL 107 10/16/2020   CO2 29 10/16/2020   GLUCOSE 108 (H) 10/16/2020   BUN 10 10/16/2020   CREATININE 0.80 10/16/2020   BILITOT 0.9 10/16/2020   ALKPHOS 85 10/16/2020   AST  21 10/16/2020   ALT 18 10/16/2020   PROT 7.2 10/16/2020   ALBUMIN 3.7 10/16/2020   CALCIUM 8.9 10/16/2020   GFRAA >60 04/26/2019    Speciality Comments: No specialty comments available.  Procedures:  No procedures performed Allergies: Aloe vera, Sulfa antibiotics, and Sulfasalazine   Assessment / Plan:     Visit Diagnoses: Chronic left shoulder pain -has been having pain and discomfort in her left shoulder since she had COVID-19 vaccine in February 2021.  She states the pain started in her right shoulder then moved to her left shoulder.  She had discomfort range of motion of her left shoulder joint.  She had good fairly good range of motion of her right shoulder joint.  She describes discomfort over her arm instead of her shoulder when she moves her arm.  Plan: XR Shoulder Left.  Humeral spur was noted.  Acromial spur was noted.  Acromioclavicular arthritis was noted.  A handout on shoulder joint exercises was given.  Pain in both hands -she complains of pain and discomfort in her bilateral wrist joints in her hands.  No synovitis was noted.  She has bilateral PIP and DIP thickening.  Plan: XR Hand 2 View Right, XR Hand 2 View Left, x-rays were consistent with osteoarthritis.  Sedimentation rate, a handout on hand exercises was given.  Rheumatoid factor, Cyclic citrul peptide antibody, IgG  Neck pain -she complains of stiffness and discomfort in her cervical spine.  She had fairly good range of motion.  Plan: XR Cervical Spine 2 or 3 views.  X-ray showed multilevel spondylosis with anterior spurring and facet joint arthropathy.  No significant disc space narrowing was noted.  A handout on C-spine exercises was given.  Myalgia-she complains of pain in the arm  muscles.  Essential hypertension-her blood pressure is controlled today systolic is slightly elevated.  S/P CABG x 3  NSTEMI (non-ST elevated myocardial infarction) ([place])  Unstable angina ([facility])  Malignant neoplasm of lower-outer quadrant of left breast of female, estrogen receptor negative ([place]) - 08/2018 lumpectomy, CTX and RTX.  Meniere's disease of left ear  History of hypothyroidism  Family history of breast cancer - two sisters  Family history of colon cancer - Maternal uncle  Family history of rheumatoid arthritis - sister has RA and ILD  Orders: Orders Placed This Encounter  Procedures  . XR Cervical Spine 2 or 3 views  . XR Shoulder Left  . XR Hand 2 View Right  . XR Hand 2 View Left  . Sedimentation rate  . Rheumatoid factor  . Cyclic citrul peptide antibody, IgG  . CK   No orders of the defined types were placed in this encounter.    Follow-Up Instructions: No follow-ups on file.   Bo Merino, MD  Note - This record has been created using Editor, commissioning.  Chart creation errors have been sought, but may not always  have been located. Such creation errors do not reflect on  the standard of medical care.

## 2020-10-15 NOTE — Progress Notes (Signed)
[place]  [phone] Fax:(336) [phone]    ID: [name] DOB: [birth year]  MR#: [id number]  EXB#:[id number]  Patient Care Team: Redmond School, MD as PCP - General (Internal Medicine) Harl Bowie, Alphonse Guild, MD as PCP - Cardiology (Cardiology) Lewanda Perea, Virgie Dad, MD as Consulting Physician (Oncology) Kyung Rudd, MD as Consulting Physician (Radiation Oncology) Rutherford Guys, MD as Consulting Physician (Ophthalmology) Jovita Kussmaul, MD as Consulting Physician (General Surgery) OTHER MD:   CHIEF COMPLAINT: Triple negative breast cancer  CURRENT TREATMENT: observation   INTERVAL HISTORY: [name] is here today for follow up of her triple negative breast cancer. She continues under observation.    Since her last visit, she underwent bilateral diagnostic mammography with tomography at [facility] on 08/05/2020 showing: breast density category B; no evidence of malignancy in either breast.    REVIEW OF SYSTEMS: Gloria Mccoy tells me since she got her COVID shots in the right shoulder she has been having pain in both shoulders.  The pain occurs with abduction primarily and is making it difficult for instance for her to soap her right side with her left hand.  She has an appointment already with rheumatology early May.  Aside from these issues a detailed review of systems today was stable.   COVID 19 VACCINATION STATUS: Status post Moderna x2, followed by booster   HISTORY OF CURRENT ILLNESS: From the original intake note:  Gloria Mccoy had routine screening mammography on 07/12/2018 showing a possible abnormality in the left breast. She underwent unilateral left diagnostic mammography with tomography and left breast ultrasonography at [facility] on 07/25/2018 showing: Breast Density Category B. There is a mass with indistinct margins in the lower inner posterior left breast measuring approximately 0.6 cm. Targeted ultrasound of the lower left  breast was performed. There is a hypoechoic mass with margin irregularity in the left breast at 7 o'clock, 7 cm from the nipple measuring 0.7 x 0.5 x 0.6 cm. No lymphadenopathy seen in the left axilla.   Accordingly on 08/01/2018 she proceeded to biopsy of the left breast area in question. The pathology from this procedure showed ([id number]): invasive ductal carcinoma, grade II. Prognostic indicators significant for: estrogen receptor, 0% negative and progesterone receptor, 0% negative. Proliferation marker Ki67 at 40%. HER2 negative (1+) by immunohistochemistry.   The patient's subsequent history is as detailed below.   PAST MEDICAL HISTORY: Past Medical History:  Diagnosis Date  . Arthritis    "hands sometimes" (07/13/2017)  . Coronary artery disease    a. s/p CABG x3 in 06/2017 with LIMA-LAD, SVG-D1, and SVG-RI.  b. 10/2017: cath showing a widely patent LIMA-LAD with ostial occlusion of the SVG-RI and subtotally occluded atretic SVG-D1. Graft occlusion thought to be 2ry to improvement in pre-CABG stenoses.   . Essential hypertension   . Family history of adverse reaction to anesthesia    sister had a complication that was stated she had a "foggy" episode after her breast surgery was readmitted 1 day post op after being discharged from the hospital. Sister also has significant lung problems that could have contributed to this   . Family history of breast cancer   . Family history of colon cancer   . Hypothyroid   . Meniere disease   . Myocardial infarction ([place]) 06/2017   during cardaic rehab  . Obesity   . Osteopenia 01/2012   T score -1.3 FRAX 7.9%/0.6%    PAST SURGICAL HISTORY: Past Surgical History:  Procedure Laterality Date  .  BREAST LUMPECTOMY WITH RADIOACTIVE SEED AND SENTINEL LYMPH NODE BIOPSY Left 09/01/2018   Procedure: LEFT BREAST LUMPECTOMY WITH RADIOACTIVE SEED AND SENTINEL LYMPH NODE BIOPSY;  Surgeon: Jovita Kussmaul, MD;  Location: [facility];  Service: General;  Laterality:  Left;  . BREAST SURGERY    . CARDIAC CATHETERIZATION  07/13/2017  . CATARACT EXTRACTION W/PHACO Right 01/28/2015   Procedure: CATARACT EXTRACTION PHACO AND INTRAOCULAR LENS PLACEMENT :  CDE:  5.70;  Surgeon: Rutherford Guys, MD;  Location: [facility];  Service: Ophthalmology;  Laterality: Right;  . CATARACT EXTRACTION W/PHACO Left 02/11/2015   Procedure: CATARACT EXTRACTION PHACO AND INTRAOCULAR LENS PLACEMENT (IOC);  Surgeon: Rutherford Guys, MD;  Location: [facility];  Service: Ophthalmology;  Laterality: Left;  CDE: 7.38  . COLONOSCOPY N/A 09/26/2012   Procedure: COLONOSCOPY;  Surgeon: Jamesetta So, MD;  [facility];  Service: Gastroenterology;  Laterality: N/A;  . CORONARY ARTERY BYPASS GRAFT N/A 07/15/2017   Procedure: CORONARY ARTERY BYPASS GRAFTING (CABG) X 3 USING LEFT INTERNAL MAMMARY ARTERY AND RIGHT SAPHENOUS VEIN- ENDOSCOPICALLY HARVESTED;  Surgeon: Melrose Nakayama, MD;  Location: [facility];  Service: Open Heart Surgery;  Laterality: N/A;  . IR IMAGING GUIDED PORT INSERTION  12/29/2018  . IR REMOVAL TUN ACCESS W/ PORT W/O FL MOD SED  04/26/2019  . LEFT HEART CATH AND CORONARY ANGIOGRAPHY N/A 07/13/2017   Procedure: LEFT HEART CATH AND CORONARY ANGIOGRAPHY;  Surgeon: Martinique, Peter M, MD;  Location: [facility];  Service: Cardiovascular;  Laterality: N/A;  . LEFT HEART CATH AND CORS/GRAFTS ANGIOGRAPHY N/A 11/03/2017   Procedure: LEFT HEART CATH AND CORS/GRAFTS ANGIOGRAPHY;  Surgeon: Troy Sine, MD;  Location: [facility];  Service: Cardiovascular;  Laterality: N/A;  . TEE WITHOUT CARDIOVERSION N/A 07/15/2017   Procedure: TRANSESOPHAGEAL ECHOCARDIOGRAM (TEE);  Surgeon: Melrose Nakayama, MD;  Location: [facility];  Service: Open Heart Surgery;  Laterality: N/A;  . TUBAL LIGATION    . TYMPANOPLASTY Left    fluid from ear drum    FAMILY HISTORY: Family History  Problem Relation Age of Onset  . Diabetes Sister        AODM  . Heart disease Sister 45       CABG  . Breast  cancer Sister   . Heart disease Brother 41       In his 62s  . Colon cancer Maternal Uncle 5  . Diabetes Sister        AODM  . Hypertension Daughter   . Hypertension Daughter   . Breast cancer Cousin        PATERNAL COUSIN  . Heart attack Father        In his 8s  . Stroke Mother   . Stroke Maternal Grandmother   . Diabetes Maternal Grandfather        d. 109  . Heart attack Paternal Grandmother   . Colon cancer Maternal Uncle 74  . Breast cancer Cousin        dx in her 52s; mat first cousin  . Breast cancer Cousin        dx 2s; d. 8s  . Breast cancer Niece 20  Keari's father died from a myocardial infarction at age 84. Patients' mother died from a stroke at age 61. The patient has 3 brothers and 9 sisters. One of Srah's sisters, [name], was diagnosed with breast cancer at the age of 73. Katerin also has a niece that was diagnosed with breast cancer at 54,  and two cousins that were diagnosed with breast cancer.  She believes 1 of her brothers may have prostate cancer.  Patient denies anyone in her family having ovarian or pancreatic cancer. Rahma has an uncle that was diagnosed with colon cancer and an uncle that was diagnosed with "stomach" cancer.    GYNECOLOGIC HISTORY:  No LMP recorded. Patient is postmenopausal. 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 Her husband, Jason Fila, is a retired Administrator. They have two daughters. Their daughter, [name], lives in [place] and is a housewife.  Their other daughter works at [employer]. Emylee had one grandchild that is now deceased. She attends a [employer].    ADVANCED DIRECTIVES: Wilhemenia's husband, Jason Fila, is automatically her healthcare power of attorney.     HEALTH MAINTENANCE: Social History   Tobacco Use  . Smoking status: Never  Smoker  . Smokeless tobacco: Former Systems developer    Types: Snuff  Vaping Use  . Vaping Use: Never used  Substance Use Topics  . Alcohol use: Not Currently    Alcohol/week: 1.0 standard drink    Types: 1 Standard drinks or equivalent per week    Comment: occasionally  . Drug use: No    Colonoscopy: yes, Dr. Arnoldo Morale  PAP:   Bone density: yes, 2016, -1.1 osteopenic   Allergies  Allergen Reactions  . Aloe Vera Itching    Per patient severe itching  . Sulfa Antibiotics Shortness Of Breath  . Sulfasalazine Shortness Of Breath    Current Outpatient Medications  Medication Sig Dispense Refill  . acetaminophen (TYLENOL) 325 MG tablet Take 2 tablets (650 mg total) by mouth every 6 (six) hours as needed for mild pain.    Marland Kitchen aspirin EC 81 MG tablet Take 81 mg by mouth daily.    Marland Kitchen atorvastatin (LIPITOR) 80 MG tablet TAKE 1 TABLET BY MOUTH ONCE DAILY AT  6PM 90 tablet 3  . cetaphil (CETAPHIL) lotion Apply 1 application topically as needed for dry skin. 236 mL 0  . Cholecalciferol (VITAMIN D PO) Take 1 tablet by mouth daily.     . Cyanocobalamin (VITAMIN B-12 PO) Take 1 tablet by mouth daily.    . fish oil-omega-3 fatty acids 1000 MG capsule Take 1 g by mouth daily.    . furosemide (LASIX) 40 MG tablet Take 1 tablet (40 mg total) by mouth daily. 90 tablet 3  . levothyroxine (SYNTHROID) 25 MCG tablet     . levothyroxine (SYNTHROID, LEVOTHROID) 75 MCG tablet Take 75 mcg by mouth daily.    Marland Kitchen losartan (COZAAR) 25 MG tablet Take 0.5 tablets (12.5 mg total) by mouth daily. 45 tablet 3  . metoprolol tartrate (LOPRESSOR) 25 MG tablet Take 1 tablet (25 mg total) by mouth 2 (two) times daily. 180 tablet 3  . nitroGLYCERIN (NITROSTAT) 0.4 MG SL tablet Place 1 tablet (0.4 mg total) under the tongue every 5 (five) minutes as needed for chest pain. 25 tablet 3  . vitamin E 100 UNIT capsule Take 1 capsule (100 Units total) by mouth daily. 30 capsule 0   No current facility-administered medications for this visit.      OBJECTIVE: African-American woman who appears stated age 75:   10/16/20 0902  BP: (!) 151/72  Pulse: 62  Resp: 19  Temp: (!) 97.3 F (  36.3 C)  SpO2: 100%     Body mass index is 38.21 kg/m.   Wt Readings from Last 3 Encounters:  10/16/20 208 lb 14.4 oz (94.8 kg)  08/29/20 196 lb (88.9 kg)  04/16/20 206 lb 8 oz (93.7 kg)  ECOG FS:1 - Symptomatic but completely ambulatory  Sclerae unicteric, EOMs intact Wearing a mask No cervical or supraclavicular adenopathy Lungs no rales or rhonchi Heart regular rate and rhythm Abd soft, nontender, positive bowel sounds MSK no focal spinal tenderness, no left upper extremity lymphedema Neuro: nonfocal, well oriented, appropriate affect Breasts: The right breast is unremarkable.  The left breast is status post lumpectomy and radiation.  There is still some hyperpigmentation.  There is no evidence of local recurrence.  Both axillae are benign.   LAB RESULTS:  CMP     Component Value Date/Time   NA 143 10/16/2020 0820   K 4.1 10/16/2020 0820   CL 107 10/16/2020 0820   CO2 29 10/16/2020 0820   GLUCOSE 108 (H) 10/16/2020 0820   BUN 10 10/16/2020 0820   CREATININE 0.80 10/16/2020 0820   CREATININE 0.78 01/16/2019 0840   CALCIUM 8.9 10/16/2020 0820   PROT 7.2 10/16/2020 0820   ALBUMIN 3.7 10/16/2020 0820   AST 21 10/16/2020 0820   AST 22 01/16/2019 0840   ALT 18 10/16/2020 0820   ALT 10 01/16/2019 0840   ALKPHOS 85 10/16/2020 0820   BILITOT 0.9 10/16/2020 0820   BILITOT 0.7 01/16/2019 0840   GFRNONAA >60 10/16/2020 0820   GFRNONAA >60 01/16/2019 0840   GFRAA >60 04/26/2019 1338   GFRAA >60 01/16/2019 0840    No results found for: TOTALPROTELP, ALBUMINELP, A1GS, A2GS, BETS, BETA2SER, GAMS, MSPIKE, SPEI  No results found for: KPAFRELGTCHN, LAMBDASER, KAPLAMBRATIO  Lab Results  Component Value Date   WBC 9.3 10/16/2020   NEUTROABS 5.8 10/16/2020   HGB 13.2 10/16/2020   HCT 36.4 10/16/2020   MCV 85.2 10/16/2020    PLT 224 10/16/2020   No results found for: LABCA2  No components found for: [id number]  No results for input(s): INR in the last 168 hours.  No results found for: LABCA2  No results found for: IPJ825  No results found for: KNL976  No results found for: BHA193  No results found for: CA2729  No components found for: HGQUANT  No results found for: CEA1 / No results found for: CEA1   No results found for: AFPTUMOR  No results found for: CHROMOGRNA  No results found for: HGBA, HGBA2QUANT, HGBFQUANT, HGBSQUAN (Hemoglobinopathy evaluation)   No results found for: LDH  No results found for: IRON, TIBC, IRONPCTSAT (Iron and TIBC)  No results found for: FERRITIN  Urinalysis    Component Value Date/Time   COLORURINE YELLOW 07/14/2017 2015   APPEARANCEUR HAZY (A) 07/14/2017 2015   LABSPEC 1.011 07/14/2017 2015   PHURINE 6.0 07/14/2017 2015   [place] 07/14/2017 2015   [place] 07/14/2017 2015   [place] 07/14/2017 2015   [place] 07/14/2017 2015   PROTEINUR NEGATIVE 07/14/2017 2015   UROBILINOGEN 0.2 07/03/2014 0852   NITRITE NEGATIVE 07/14/2017 2015   LEUKOCYTESUR LARGE (A) 07/14/2017 2015    STUDIES:  No results found.   ELIGIBLE FOR AVAILABLE RESEARCH PROTOCOL: no   ASSESSMENT: 75 y.o. [place] woman status post left breast lower inner quadrant biopsy 08/01/2018 for a clinical T1c N0, stage IB invasive ductal carcinoma, grade 2, triple negative, with an MIB-1 of 40%  (1) genetics testing 08/29/2018  through with Hereditary Gene Panel offered by [facility] found no deleterious mutations in APC, ATM, AXIN2, BARD1, BMPR1A, BRCA1, BRCA2, BRIP1, CDH1, CDK4, CDKN2A (p14ARF), CDKN2A (p16INK4a), CHEK2, CTNNA1, DICER1, EPCAM (Deletion/duplication testing only), GREM1 (promoter region deletion/duplication testing only), KIT, MEN1, MLH1, MSH2, MSH3, MSH6, MUTYH, NBN, NF1, NHTL1, PALB2, PDGFRA, PMS2, POLD1, POLE, PTEN, RAD50, RAD51C,  RAD51D, SDHB, SDHC, SDHD, SMAD4, SMARCA4. STK11, TP53, TSC1, TSC2, and VHL.  The following genes were evaluated for sequence changes only: SDHA and HOXB13 c.251G>A variant only.   (a) MLH1 c.1890T>G VUS identified  (2) status post left lumpectomy with sentinel lymph node sampling 09/01/2018 for a pT1c pN0, stage IB invasive ductal carcinoma, grade 2, with negative margins.  (a) a total of 3 sentinel lymph nodes were removed  (3) chemotherapy consisting of cyclophosphamide, methotrexate, and fluorouracil (CMF), repeated every 21 days x 8, from 10/24/2018-04/05/2019  (4) adjuvant radiation given concurrently with cycles 3-4-5 of chemotherapy from 12/05/2018-01/19/2019.  The left breast was treated to 50.4 Gy in 28 fractions, followed by a 10 Gy boost to the lumpectomy cavity.   PLAN: Kahlyn is now just over 2 years out from definitive surgery for her breast cancer with no evidence of disease recurrence.  This is very favorable.  We discussed bursitis and rotator cuff issues and I am not sure which 1 of those may be the cause of her shoulder discomfort bilaterally, or perhaps there are other reasons.  But it is not going to be is cancer.  She already has an appointment with Dr. Inez Pilgrim for early May and she will have further evaluation at that point.  I reassured her that a little discomfort in the left axilla and shooting pains in the breast as well as soreness and sensitivity around the nipple are all very common after the treatment she has received and do not indicate breast cancer recurrence.  At this point we can start seeing her on a once a year basis until she completes her 5 years of follow-up  She knows to call for any other issue that may develop before the next visit  Total encounter time 20 minutes.Sarajane Jews C. Madison Albea, MD 10/16/20 9:20 AM Medical Oncology and Hematology [facility] [place], [place] [address] Tel. [phone]    Fax.  [phone]   I, Wilburn Mylar, am acting as scribe for Dr. Virgie Dad. Arriyah Madej.  I, Lurline Del MD, have reviewed the above documentation for accuracy and completeness, and I agree with the above.   *Total Encounter Time as defined by the Centers for Medicare and Medicaid Services includes, in addition to the face-to-face time of a patient visit (documented in the note above) non-face-to-face time: obtaining and reviewing outside history, ordering and reviewing medications, tests or procedures, care coordination (communications with other health care professionals or caregivers) and documentation in the medical record.

## 2020-10-16 ENCOUNTER — Inpatient Hospital Stay: Payer: Medicare HMO

## 2020-10-16 ENCOUNTER — Inpatient Hospital Stay: Payer: Medicare HMO | Attending: Oncology | Admitting: Oncology

## 2020-10-16 ENCOUNTER — Other Ambulatory Visit: Payer: Self-pay

## 2020-10-16 VITALS — BP 151/72 | HR 62 | Temp 97.3°F | Resp 19 | Ht 62.0 in | Wt 208.9 lb

## 2020-10-16 DIAGNOSIS — Z6838 Body mass index (BMI) 38.0-38.9, adult: Secondary | ICD-10-CM | POA: Diagnosis not present

## 2020-10-16 DIAGNOSIS — I252 Old myocardial infarction: Secondary | ICD-10-CM | POA: Insufficient documentation

## 2020-10-16 DIAGNOSIS — I10 Essential (primary) hypertension: Secondary | ICD-10-CM | POA: Insufficient documentation

## 2020-10-16 DIAGNOSIS — Z79899 Other long term (current) drug therapy: Secondary | ICD-10-CM | POA: Insufficient documentation

## 2020-10-16 DIAGNOSIS — C50512 Malignant neoplasm of lower-outer quadrant of left female breast: Secondary | ICD-10-CM | POA: Diagnosis not present

## 2020-10-16 DIAGNOSIS — C50312 Malignant neoplasm of lower-inner quadrant of left female breast: Secondary | ICD-10-CM | POA: Diagnosis not present

## 2020-10-16 DIAGNOSIS — Z171 Estrogen receptor negative status [ER-]: Secondary | ICD-10-CM

## 2020-10-16 DIAGNOSIS — Z803 Family history of malignant neoplasm of breast: Secondary | ICD-10-CM | POA: Insufficient documentation

## 2020-10-16 DIAGNOSIS — Z882 Allergy status to sulfonamides status: Secondary | ICD-10-CM | POA: Diagnosis not present

## 2020-10-16 DIAGNOSIS — Z800 Family history of malignant neoplasm of digestive organs: Secondary | ICD-10-CM | POA: Insufficient documentation

## 2020-10-16 DIAGNOSIS — Z833 Family history of diabetes mellitus: Secondary | ICD-10-CM | POA: Diagnosis not present

## 2020-10-16 DIAGNOSIS — Z823 Family history of stroke: Secondary | ICD-10-CM | POA: Insufficient documentation

## 2020-10-16 DIAGNOSIS — Z8249 Family history of ischemic heart disease and other diseases of the circulatory system: Secondary | ICD-10-CM | POA: Insufficient documentation

## 2020-10-16 LAB — CBC WITH DIFFERENTIAL/PLATELET
Abs Immature Granulocytes: 0.02 10*3/uL (ref 0.00–0.07)
Basophils Absolute: 0.1 10*3/uL (ref 0.0–0.1)
Basophils Relative: 1 %
Eosinophils Absolute: 0.2 10*3/uL (ref 0.0–0.5)
Eosinophils Relative: 2 %
HCT: 36.4 % (ref 36.0–46.0)
Hemoglobin: 13.2 g/dL (ref 12.0–15.0)
Immature Granulocytes: 0 %
Lymphocytes Relative: 25 %
Lymphs Abs: 2.3 10*3/uL (ref 0.7–4.0)
MCH: 30.9 pg (ref 26.0–34.0)
MCHC: 36.3 g/dL — ABNORMAL HIGH (ref 30.0–36.0)
MCV: 85.2 fL (ref 80.0–100.0)
Monocytes Absolute: 1 10*3/uL (ref 0.1–1.0)
Monocytes Relative: 10 %
Neutro Abs: 5.8 10*3/uL (ref 1.7–7.7)
Neutrophils Relative %: 62 %
Platelets: 224 10*3/uL (ref 150–400)
RBC: 4.27 MIL/uL (ref 3.87–5.11)
RDW: 14 % (ref 11.5–15.5)
WBC: 9.3 10*3/uL (ref 4.0–10.5)
nRBC: 0 % (ref 0.0–0.2)

## 2020-10-16 LAB — COMPREHENSIVE METABOLIC PANEL
ALT: 18 U/L (ref 0–44)
AST: 21 U/L (ref 15–41)
Albumin: 3.7 g/dL (ref 3.5–5.0)
Alkaline Phosphatase: 85 U/L (ref 38–126)
Anion gap: 7 (ref 5–15)
BUN: 10 mg/dL (ref 8–23)
CO2: 29 mmol/L (ref 22–32)
Calcium: 8.9 mg/dL (ref 8.9–10.3)
Chloride: 107 mmol/L (ref 98–111)
Creatinine, Ser: 0.8 mg/dL (ref 0.44–1.00)
GFR, Estimated: >60 mL/min (ref >60)
Glucose, Bld: 108 mg/dL — ABNORMAL HIGH (ref 70–99)
Potassium: 4.1 mmol/L (ref 3.5–5.1)
Sodium: 143 mmol/L (ref 135–145)
Total Bilirubin: 0.9 mg/dL (ref 0.3–1.2)
Total Protein: 7.2 g/dL (ref 6.5–8.1)

## 2020-10-21 ENCOUNTER — Telehealth: Payer: Self-pay | Admitting: Oncology

## 2020-10-21 NOTE — Telephone Encounter (Signed)
Sch per 04/28 los, Patient aware

## 2020-10-27 ENCOUNTER — Encounter: Payer: Self-pay | Admitting: Rheumatology

## 2020-10-27 ENCOUNTER — Other Ambulatory Visit: Payer: Self-pay

## 2020-10-27 ENCOUNTER — Ambulatory Visit: Payer: Self-pay

## 2020-10-27 ENCOUNTER — Ambulatory Visit: Payer: Medicare HMO | Admitting: Rheumatology

## 2020-10-27 VITALS — BP 140/73 | HR 59 | Resp 16 | Ht 63.0 in | Wt 206.6 lb

## 2020-10-27 DIAGNOSIS — Z8261 Family history of arthritis: Secondary | ICD-10-CM

## 2020-10-27 DIAGNOSIS — M542 Cervicalgia: Secondary | ICD-10-CM | POA: Diagnosis not present

## 2020-10-27 DIAGNOSIS — Z171 Estrogen receptor negative status [ER-]: Secondary | ICD-10-CM

## 2020-10-27 DIAGNOSIS — H8102 Meniere's disease, left ear: Secondary | ICD-10-CM | POA: Diagnosis not present

## 2020-10-27 DIAGNOSIS — I10 Essential (primary) hypertension: Secondary | ICD-10-CM

## 2020-10-27 DIAGNOSIS — Z951 Presence of aortocoronary bypass graft: Secondary | ICD-10-CM

## 2020-10-27 DIAGNOSIS — M79641 Pain in right hand: Secondary | ICD-10-CM

## 2020-10-27 DIAGNOSIS — H8109 Meniere's disease, unspecified ear: Secondary | ICD-10-CM

## 2020-10-27 DIAGNOSIS — I214 Non-ST elevation (NSTEMI) myocardial infarction: Secondary | ICD-10-CM | POA: Diagnosis not present

## 2020-10-27 DIAGNOSIS — G8929 Other chronic pain: Secondary | ICD-10-CM | POA: Diagnosis not present

## 2020-10-27 DIAGNOSIS — C50512 Malignant neoplasm of lower-outer quadrant of left female breast: Secondary | ICD-10-CM | POA: Diagnosis not present

## 2020-10-27 DIAGNOSIS — M2550 Pain in unspecified joint: Secondary | ICD-10-CM

## 2020-10-27 DIAGNOSIS — Z8639 Personal history of other endocrine, nutritional and metabolic disease: Secondary | ICD-10-CM

## 2020-10-27 DIAGNOSIS — M791 Myalgia, unspecified site: Secondary | ICD-10-CM | POA: Diagnosis not present

## 2020-10-27 DIAGNOSIS — Z803 Family history of malignant neoplasm of breast: Secondary | ICD-10-CM

## 2020-10-27 DIAGNOSIS — I200 Unstable angina: Secondary | ICD-10-CM

## 2020-10-27 DIAGNOSIS — Z95828 Presence of other vascular implants and grafts: Secondary | ICD-10-CM

## 2020-10-27 DIAGNOSIS — Z800 Family history of malignant neoplasm of digestive organs: Secondary | ICD-10-CM

## 2020-10-27 DIAGNOSIS — M25512 Pain in left shoulder: Secondary | ICD-10-CM | POA: Diagnosis not present

## 2020-10-27 DIAGNOSIS — M79642 Pain in left hand: Secondary | ICD-10-CM

## 2020-10-27 NOTE — Patient Instructions (Signed)
Shoulder Exercises Ask your health care provider which exercises are safe for you. Do exercises exactly as told by your health care provider and adjust them as directed. It is normal to feel mild stretching, pulling, tightness, or discomfort as you do these exercises. Stop right away if you feel sudden pain or your pain gets worse. Do not begin these exercises until told by your health care provider. Stretching exercises External rotation and abduction This exercise is sometimes called corner stretch. This exercise rotates your arm outward (external rotation) and moves your arm out from your body (abduction). 1. Stand in a doorway with one of your feet slightly in front of the other. This is called a staggered stance. If you cannot reach your forearms to the door frame, stand facing a corner of a room. 2. Choose one of the following positions as told by your health care provider: ? Place your hands and forearms on the door frame above your head. ? Place your hands and forearms on the door frame at the height of your head. ? Place your hands on the door frame at the height of your elbows. 3. Slowly move your weight onto your front foot until you feel a stretch across your chest and in the front of your shoulders. Keep your head and chest upright and keep your abdominal muscles tight. 4. Hold for __________ seconds. 5. To release the stretch, shift your weight to your back foot. Repeat __________ times. Complete this exercise __________ times a day.   Extension, standing 1. Stand and hold a broomstick, a cane, or a similar object behind your back. ? Your hands should be a little wider than shoulder width apart. ? Your palms should face away from your back. 2. Keeping your elbows straight and your shoulder muscles relaxed, move the stick away from your body until you feel a stretch in your shoulders (extension). ? Avoid shrugging your shoulders while you move the stick. Keep your shoulder blades  tucked down toward the middle of your back. 3. Hold for __________ seconds. 4. Slowly return to the starting position. Repeat __________ times. Complete this exercise __________ times a day. Range-of-motion exercises Pendulum 1. Stand near a wall or a surface that you can hold onto for balance. 2. Bend at the waist and let your left / right arm hang straight down. Use your other arm to support you. Keep your back straight and do not lock your knees. 3. Relax your left / right arm and shoulder muscles, and move your hips and your trunk so your left / right arm swings freely. Your arm should swing because of the motion of your body, not because you are using your arm or shoulder muscles. 4. Keep moving your hips and trunk so your arm swings in the following directions, as told by your health care provider: ? Side to side. ? Forward and backward. ? In clockwise and counterclockwise circles. 5. Continue each motion for __________ seconds, or for as long as told by your health care provider. 6. Slowly return to the starting position. Repeat __________ times. Complete this exercise __________ times a day.   Shoulder flexion, standing 1. Stand and hold a broomstick, a cane, or a similar object. Place your hands a little more than shoulder width apart on the object. Your left / right hand should be palm up, and your other hand should be palm down. 2. Keep your elbow straight and your shoulder muscles relaxed. Push the stick up with your healthy   arm to raise your left / right arm in front of your body, and then over your head until you feel a stretch in your shoulder (flexion). ? Avoid shrugging your shoulder while you raise your arm. Keep your shoulder blade tucked down toward the middle of your back. 3. Hold for __________ seconds. 4. Slowly return to the starting position. Repeat __________ times. Complete this exercise __________ times a day.   Shoulder abduction, standing 1. Stand and hold a  broomstick, a cane, or a similar object. Place your hands a little more than shoulder width apart on the object. Your left / right hand should be palm up, and your other hand should be palm down. 2. Keep your elbow straight and your shoulder muscles relaxed. Push the object across your body toward your left / right side. Raise your left / right arm to the side of your body (abduction) until you feel a stretch in your shoulder. ? Do not raise your arm above shoulder height unless your health care provider tells you to do that. ? If directed, raise your arm over your head. ? Avoid shrugging your shoulder while you raise your arm. Keep your shoulder blade tucked down toward the middle of your back. 3. Hold for __________ seconds. 4. Slowly return to the starting position. Repeat __________ times. Complete this exercise __________ times a day. Internal rotation 1. Place your left / right hand behind your back, palm up. 2. Use your other hand to dangle an exercise band, a towel, or a similar object over your shoulder. Grasp the band with your left / right hand so you are holding on to both ends. 3. Gently pull up on the band until you feel a stretch in the front of your left / right shoulder. The movement of your arm toward the center of your body is called internal rotation. ? Avoid shrugging your shoulder while you raise your arm. Keep your shoulder blade tucked down toward the middle of your back. 4. Hold for __________ seconds. 5. Release the stretch by letting go of the band and lowering your hands. Repeat __________ times. Complete this exercise __________ times a day.   Strengthening exercises External rotation 1. Sit in a stable chair without armrests. 2. Secure an exercise band to a stable object at elbow height on your left / right side. 3. Place a soft object, such as a folded towel or a small pillow, between your left / right upper arm and your body to move your elbow about 4 inches (10  cm) away from your side. 4. Hold the end of the exercise band so it is tight and there is no slack. 5. Keeping your elbow pressed against the soft object, slowly move your forearm out, away from your abdomen (external rotation). Keep your body steady so only your forearm moves. 6. Hold for __________ seconds. 7. Slowly return to the starting position. Repeat __________ times. Complete this exercise __________ times a day.   Shoulder abduction 1. Sit in a stable chair without armrests, or stand up. 2. Hold a __________ weight in your left / right hand, or hold an exercise band with both hands. 3. Start with your arms straight down and your left / right palm facing in, toward your body. 4. Slowly lift your left / right hand out to your side (abduction). Do not lift your hand above shoulder height unless your health care provider tells you that this is safe. ? Keep your arms straight. ? Avoid   shrugging your shoulder while you do this movement. Keep your shoulder blade tucked down toward the middle of your back. 5. Hold for __________ seconds. 6. Slowly lower your arm, and return to the starting position. Repeat __________ times. Complete this exercise __________ times a day.   Shoulder extension 1. Sit in a stable chair without armrests, or stand up. 2. Secure an exercise band to a stable object in front of you so it is at shoulder height. 3. Hold one end of the exercise band in each hand. Your palms should face each other. 4. Straighten your elbows and lift your hands up to shoulder height. 5. Step back, away from the secured end of the exercise band, until the band is tight and there is no slack. 6. Squeeze your shoulder blades together as you pull your hands down to the sides of your thighs (extension). Stop when your hands are straight down by your sides. Do not let your hands go behind your body. 7. Hold for __________ seconds. 8. Slowly return to the starting position. Repeat __________  times. Complete this exercise __________ times a day. Shoulder row 1. Sit in a stable chair without armrests, or stand up. 2. Secure an exercise band to a stable object in front of you so it is at waist height. 3. Hold one end of the exercise band in each hand. Position your palms so that your thumbs are facing the ceiling (neutral position). 4. Bend each of your elbows to a 90-degree angle (right angle) and keep your upper arms at your sides. 5. Step back until the band is tight and there is no slack. 6. Slowly pull your elbows back behind you. 7. Hold for __________ seconds. 8. Slowly return to the starting position. Repeat __________ times. Complete this exercise __________ times a day. Shoulder press-ups 1. Sit in a stable chair that has armrests. Sit upright, with your feet flat on the floor. 2. Put your hands on the armrests so your elbows are bent and your fingers are pointing forward. Your hands should be about even with the sides of your body. 3. Push down on the armrests and use your arms to lift yourself off the chair. Straighten your elbows and lift yourself up as much as you comfortably can. ? Move your shoulder blades down, and avoid letting your shoulders move up toward your ears. ? Keep your feet on the ground. As you get stronger, your feet should support less of your body weight as you lift yourself up. 4. Hold for __________ seconds. 5. Slowly lower yourself back into the chair. Repeat __________ times. Complete this exercise __________ times a day.   Wall push-ups 1. Stand so you are facing a stable wall. Your feet should be about one arm-length away from the wall. 2. Lean forward and place your palms on the wall at shoulder height. 3. Keep your feet flat on the floor as you bend your elbows and lean forward toward the wall. 4. Hold for __________ seconds. 5. Straighten your elbows to push yourself back to the starting position. Repeat __________ times. Complete this  exercise __________ times a day.   This information is not intended to replace advice given to you by your health care provider. Make sure you discuss any questions you have with your health care provider. Document Revised: 09/29/2018 Document Reviewed: 07/07/2018 Elsevier Patient Education  2021 [place]. Cervical Strain and Sprain Rehab Ask your health care provider which exercises are safe for you. Do exercises  exactly as told by your health care provider and adjust them as directed. It is normal to feel mild stretching, pulling, tightness, or discomfort as you do these exercises. Stop right away if you feel sudden pain or your pain gets worse. Do not begin these exercises until told by your health care provider. Stretching and range-of-motion exercises Cervical side bending 6. Using good posture, sit on a stable chair or stand up. 7. Without moving your shoulders, slowly tilt your left / right ear to your shoulder until you feel a stretch in the opposite side neck muscles. You should be looking straight ahead. 8. Hold for __________ seconds. 9. Repeat with the other side of your neck. Repeat __________ times. Complete this exercise __________ times a day.   Cervical rotation 5. Using good posture, sit on a stable chair or stand up. 6. Slowly turn your head to the side as if you are looking over your left / right shoulder. ? Keep your eyes level with the ground. ? Stop when you feel a stretch along the side and the back of your neck. 7. Hold for __________ seconds. 8. Repeat this by turning to your other side. Repeat __________ times. Complete this exercise __________ times a day.   Thoracic extension and pectoral stretch 7. Roll a towel or a small blanket so it is about 4 inches (10 cm) in diameter. 8. Lie down on your back on a firm surface. 9. Put the towel lengthwise, under your spine in the middle of your back. It should not be under your shoulder blades. The towel should line up  with your spine from your middle back to your lower back. 10. Put your hands behind your head and let your elbows fall out to your sides. 11. Hold for __________ seconds. Repeat __________ times. Complete this exercise __________ times a day. Strengthening exercises Isometric upper cervical flexion 5. Lie on your back with a thin pillow behind your head and a small rolled-up towel under your neck. 6. Gently tuck your chin toward your chest and nod your head down to look toward your feet. Do not lift your head off the pillow. 7. Hold for __________ seconds. 8. Release the tension slowly. Relax your neck muscles completely before you repeat this exercise. Repeat __________ times. Complete this exercise __________ times a day. Isometric cervical extension 5. Stand about 6 inches (15 cm) away from a wall, with your back facing the wall. 6. Place a soft object, about 6-8 inches (15-20 cm) in diameter, between the back of your head and the wall. A soft object could be a small pillow, a ball, or a folded towel. 7. Gently tilt your head back and press into the soft object. Keep your jaw and forehead relaxed. 8. Hold for __________ seconds. 9. Release the tension slowly. Relax your neck muscles completely before you repeat this exercise. Repeat __________ times. Complete this exercise __________ times a day.   Posture and body mechanics Body mechanics refers to the movements and positions of your body while you do your daily activities. Posture is part of body mechanics. Good posture and healthy body mechanics can help to relieve stress in your body's tissues and joints. Good posture means that your spine is in its natural S-curve position (your spine is neutral), your shoulders are pulled back slightly, and your head is not tipped forward. The following are general guidelines for applying improved posture and body mechanics to your everyday activities. Sitting 6. When sitting, keep your spine  neutral  and keep your feet flat on the floor. Use a footrest, if necessary, and keep your thighs parallel to the floor. Avoid rounding your shoulders, and avoid tilting your head forward. 7. When working at a desk or a computer, keep your desk at a height where your hands are slightly lower than your elbows. Slide your chair under your desk so you are close enough to maintain good posture. 8. When working at a computer, place your monitor at a height where you are looking straight ahead and you do not have to tilt your head forward or downward to look at the screen.   Standing  When standing, keep your spine neutral and keep your feet about hip-width apart. Keep a slight bend in your knees. Your ears, shoulders, and hips should line up.  When you do a task in which you stand in one place for a long time, place one foot up on a stable object that is 2-4 inches (5-10 cm) high, such as a footstool. This helps keep your spine neutral.   Resting When lying down and resting, avoid positions that are most painful for you. Try to support your neck in a neutral position. You can use a contour pillow or a small rolled-up towel. Your pillow should support your neck but not push on it. This information is not intended to replace advice given to you by your health care provider. Make sure you discuss any questions you have with your health care provider. Document Revised: 09/27/2018 Document Reviewed: 03/08/2018 Elsevier Patient Education  2021 [place].  Hand Exercises Hand exercises can be helpful for almost anyone. These exercises can strengthen the hands, improve flexibility and movement, and increase blood flow to the hands. These results can make work and daily tasks easier. Hand exercises can be especially helpful for people who have joint pain from arthritis or have nerve damage from overuse (carpal tunnel syndrome). These exercises can also help people who have injured a hand. Exercises Most of these hand  exercises are gentle stretching and motion exercises. It is usually safe to do them often throughout the day. Warming up your hands before exercise may help to reduce stiffness. You can do this with gentle massage or by placing your hands in warm water for 10-15 minutes. It is normal to feel some stretching, pulling, tightness, or mild discomfort as you begin new exercises. This will gradually improve. Stop an exercise right away if you feel sudden, severe pain or your pain gets worse. Ask your health care provider which exercises are best for you. Knuckle bend or "claw" fist 1. Stand or sit with your arm, hand, and all five fingers pointed straight up. Make sure to keep your wrist straight during the exercise. 2. Gently bend your fingers down toward your palm until the tips of your fingers are touching the top of your palm. Keep your big knuckle straight and just bend the small knuckles in your fingers. 3. Hold this position for __________ seconds. 4. Straighten (extend) your fingers back to the starting position. Repeat this exercise 5-10 times with each hand. Full finger fist 1. Stand or sit with your arm, hand, and all five fingers pointed straight up. Make sure to keep your wrist straight during the exercise. 2. Gently bend your fingers into your palm until the tips of your fingers are touching the middle of your palm. 3. Hold this position for __________ seconds. 4. Extend your fingers back to the starting position, stretching every  joint fully. Repeat this exercise 5-10 times with each hand. Straight fist 1. Stand or sit with your arm, hand, and all five fingers pointed straight up. Make sure to keep your wrist straight during the exercise. 2. Gently bend your fingers at the big knuckle, where your fingers meet your hand, and the middle knuckle. Keep the knuckle at the tips of your fingers straight and try to touch the bottom of your palm. 3. Hold this position for __________  seconds. 4. Extend your fingers back to the starting position, stretching every joint fully. Repeat this exercise 5-10 times with each hand. Tabletop 1. Stand or sit with your arm, hand, and all five fingers pointed straight up. Make sure to keep your wrist straight during the exercise. 2. Gently bend your fingers at the big knuckle, where your fingers meet your hand, as far down as you can while keeping the small knuckles in your fingers straight. Think of forming a tabletop with your fingers. 3. Hold this position for __________ seconds. 4. Extend your fingers back to the starting position, stretching every joint fully. Repeat this exercise 5-10 times with each hand. Finger spread 1. Place your hand flat on a table with your palm facing down. Make sure your wrist stays straight as you do this exercise. 2. Spread your fingers and thumb apart from each other as far as you can until you feel a gentle stretch. Hold this position for __________ seconds. 3. Bring your fingers and thumb tight together again. Hold this position for __________ seconds. Repeat this exercise 5-10 times with each hand. Making circles 1. Stand or sit with your arm, hand, and all five fingers pointed straight up. Make sure to keep your wrist straight during the exercise. 2. Make a circle by touching the tip of your thumb to the tip of your index finger. 3. Hold for __________ seconds. Then open your hand wide. 4. Repeat this motion with your thumb and each finger on your hand. Repeat this exercise 5-10 times with each hand. Thumb motion 1. Sit with your forearm resting on a table and your wrist straight. Your thumb should be facing up toward the ceiling. Keep your fingers relaxed as you move your thumb. 2. Lift your thumb up as high as you can toward the ceiling. Hold for __________ seconds. 3. Bend your thumb across your palm as far as you can, reaching the tip of your thumb for the small finger (pinkie) side of your  palm. Hold for __________ seconds. Repeat this exercise 5-10 times with each hand. Grip strengthening 1. Hold a stress ball or other soft ball in the middle of your hand. 2. Slowly increase the pressure, squeezing the ball as much as you can without causing pain. Think of bringing the tips of your fingers into the middle of your palm. All of your finger joints should bend when doing this exercise. 3. Hold your squeeze for __________ seconds, then relax. Repeat this exercise 5-10 times with each hand.   Contact a health care provider if:  Your hand pain or discomfort gets much worse when you do an exercise.  Your hand pain or discomfort does not improve within 2 hours after you exercise. If you have any of these problems, stop doing these exercises right away. Do not do them again unless your health care provider says that you can. Get help right away if:  You develop sudden, severe hand pain or swelling. If this happens, stop doing these exercises right away.  Do not do them again unless your health care provider says that you can. This information is not intended to replace advice given to you by your health care provider. Make sure you discuss any questions you have with your health care provider. Document Revised: 09/28/2018 Document Reviewed: 06/08/2018 Elsevier Patient Education  2021 [employer].

## 2020-10-28 ENCOUNTER — Other Ambulatory Visit: Payer: Self-pay | Admitting: *Deleted

## 2020-10-28 DIAGNOSIS — R748 Abnormal levels of other serum enzymes: Secondary | ICD-10-CM

## 2020-10-28 NOTE — Progress Notes (Signed)
CK is elevated.  Please add myositis panel.  I plan to repeat CK and aldolase at the follow-up visit.

## 2020-10-29 DIAGNOSIS — E063 Autoimmune thyroiditis: Secondary | ICD-10-CM | POA: Diagnosis not present

## 2020-10-29 DIAGNOSIS — M47812 Spondylosis without myelopathy or radiculopathy, cervical region: Secondary | ICD-10-CM | POA: Diagnosis not present

## 2020-10-29 DIAGNOSIS — E6609 Other obesity due to excess calories: Secondary | ICD-10-CM | POA: Diagnosis not present

## 2020-10-29 DIAGNOSIS — R7309 Other abnormal glucose: Secondary | ICD-10-CM | POA: Diagnosis not present

## 2020-10-29 DIAGNOSIS — R059 Cough, unspecified: Secondary | ICD-10-CM | POA: Diagnosis not present

## 2020-10-29 DIAGNOSIS — Z1331 Encounter for screening for depression: Secondary | ICD-10-CM | POA: Diagnosis not present

## 2020-10-29 DIAGNOSIS — Z0000 Encounter for general adult medical examination without abnormal findings: Secondary | ICD-10-CM | POA: Diagnosis not present

## 2020-10-29 DIAGNOSIS — Z6835 Body mass index (BMI) 35.0-35.9, adult: Secondary | ICD-10-CM | POA: Diagnosis not present

## 2020-10-29 DIAGNOSIS — E7849 Other hyperlipidemia: Secondary | ICD-10-CM | POA: Diagnosis not present

## 2020-10-31 LAB — MYOSITIS SPECIFIC II ANTIBODIES PANEL
EJ AB: <11 SI (ref <11)
JO-1 AB: <11 SI (ref <11)
MDA-5 AB: <11 SI (ref <11)
MI-2 ALPHA AB: <11 SI (ref <11)
MI-2 BETA AB: <11 SI (ref <11)
NXP-2 AB: <11 SI (ref <11)
OJ AB: <11 SI (ref <11)
PL-12 AB: <11 SI (ref <11)
PL-7 AB: 17 SI — ABNORMAL HIGH (ref <11)
SRP-AB: <11 SI (ref <11)
TIF-1y AB: <11 SI (ref <11)

## 2020-10-31 LAB — TEST AUTHORIZATION

## 2020-10-31 LAB — CYCLIC CITRUL PEPTIDE ANTIBODY, IGG: Cyclic Citrullin Peptide Ab: <16 UNITS

## 2020-10-31 LAB — RHEUMATOID FACTOR: Rheumatoid fact SerPl-aCnc: <14 IU/mL (ref <14)

## 2020-10-31 LAB — SEDIMENTATION RATE: Sed Rate: 25 mm/h (ref 0–30)

## 2020-10-31 LAB — CK: Total CK: 494 U/L — ABNORMAL HIGH (ref 29–143)

## 2020-11-02 NOTE — Progress Notes (Signed)
Please schedule a non-urgent earlier appointment.

## 2020-11-03 ENCOUNTER — Other Ambulatory Visit: Payer: Self-pay | Admitting: Student

## 2020-11-05 NOTE — Progress Notes (Signed)
Office Visit Note  Patient: Gloria Mccoy             Date of Birth: 06/13/1946           MRN: [id number]             PCP: Redmond School, MD Referring: Redmond School, MD Visit Date: 11/12/2020 Occupation: @GUAROCC @  Subjective:  Pain in both arms.   History of Present Illness: Gloria Mccoy is a 75 y.o. female with history of pain in both arms.  The pain started after her last COVID-19 vaccination.  She states that she was given a intramuscular cortisone injection by her PCP in the second week of May.  Her symptoms improved after that and now the discomfort is coming back in her left arm.  She has difficulty raising her arm due to pain in her both arms.  She denies any weakness in her lower extremities or discomfort in her lower extremities.  She has no difficulty getting up from the chair or climbing stairs.  Patient denies any dysphagia or shortness of breath.  She states she does have some shortness of breath on exertion because of her heart disease.  Activities of Daily Living:  Patient reports morning stiffness for 5-10 minutes.   Patient Reports nocturnal pain.  Difficulty dressing/grooming: Reports Difficulty climbing stairs: Denies Difficulty getting out of chair: Denies Difficulty using hands for taps, buttons, cutlery, and/or writing: Denies  Review of Systems  Constitutional: Positive for fatigue.  HENT: Negative for mouth sores, mouth dryness and nose dryness.   Eyes: Negative for pain, itching and dryness.  Respiratory: Positive for shortness of breath. Negative for difficulty breathing.        With exertion.   Cardiovascular: Negative for chest pain and palpitations.  Gastrointestinal: Negative for blood in stool, constipation and diarrhea.  Endocrine: Negative for increased urination.  Genitourinary: Negative for difficulty urinating.  Musculoskeletal: Positive for myalgias, morning stiffness and myalgias. Negative for arthralgias, joint pain, joint  swelling and muscle tenderness.  Skin: Negative for color change, rash and redness.  Allergic/Immunologic: Negative for susceptible to infections.  Neurological: Positive for numbness and memory loss. Negative for dizziness, headaches and weakness.  Hematological: Negative for bruising/bleeding tendency.  Psychiatric/Behavioral: Negative for confusion.    PMFS History:  Patient Active Problem List   Diagnosis Date Noted  . Port-A-Cath in place 01/16/2019  . Genetic testing 08/29/2018  . Family history of breast cancer   . Family history of colon cancer   . Malignant neoplasm of lower-outer quadrant of left breast of female, estrogen receptor negative ([place]) 08/16/2018  . NSTEMI (non-ST elevated myocardial infarction) ([place]) 11/02/2017  . S/P CABG x 3 07/15/2017  . Coronary artery disease 07/15/2017  . Unstable angina ([place]) 07/13/2017  . Essential hypertension 07/13/2017  . Obesity 07/13/2017  . Hyperglycemia 07/13/2017  . Hypothyroid 01/06/2012  . Meniere disease   . Arthritis     Past Medical History:  Diagnosis Date  . Arthritis    "hands sometimes" (07/13/2017)  . Coronary artery disease    a. s/p CABG x3 in 06/2017 with LIMA-LAD, SVG-D1, and SVG-RI.  b. 10/2017: cath showing a widely patent LIMA-LAD with ostial occlusion of the SVG-RI and subtotally occluded atretic SVG-D1. Graft occlusion thought to be 2ry to improvement in pre-CABG stenoses.   . Essential hypertension   . Family history of adverse reaction to anesthesia    sister had a complication that was stated she had a "foggy" episode after  her breast surgery was readmitted 1 day post op after being discharged from the hospital. Sister also has significant lung problems that could have contributed to this   . Family history of breast cancer   . Family history of colon cancer   . Hypothyroid   . Meniere disease   . Myocardial infarction ([place]) 06/2017   during cardaic rehab  . Obesity   . Osteopenia 01/2012   T score  -1.3 FRAX 7.9%/0.6%    Family History  Problem Relation Age of Onset  . Diabetes Sister        AODM  . Heart disease Sister 17       CABG  . Breast cancer Sister   . Heart disease Brother 48       In his 19s  . Colon cancer Maternal Uncle 68  . Diabetes Sister        AODM  . Hypertension Daughter   . Hypertension Daughter   . Heart attack Father        In his 11s  . Stroke Mother   . Stroke Maternal Grandmother   . Diabetes Maternal Grandfather        d. 109  . Heart attack Paternal Grandmother   . Colon cancer Maternal Uncle 63  . Breast cancer Cousin        dx 41s; d. 26s  . Breast cancer Niece 68   Past Surgical History:  Procedure Laterality Date  . BREAST LUMPECTOMY WITH RADIOACTIVE SEED AND SENTINEL LYMPH NODE BIOPSY Left 09/01/2018   Procedure: LEFT BREAST LUMPECTOMY WITH RADIOACTIVE SEED AND SENTINEL LYMPH NODE BIOPSY;  Surgeon: Jovita Kussmaul, MD;  Location: [facility];  Service: General;  Laterality: Left;  . BREAST SURGERY    . CARDIAC CATHETERIZATION  07/13/2017  . CATARACT EXTRACTION W/PHACO Right 01/28/2015   Procedure: CATARACT EXTRACTION PHACO AND INTRAOCULAR LENS PLACEMENT :  CDE:  5.70;  Surgeon: Rutherford Guys, MD;  Location: [facility];  Service: Ophthalmology;  Laterality: Right;  . CATARACT EXTRACTION W/PHACO Left 02/11/2015   Procedure: CATARACT EXTRACTION PHACO AND INTRAOCULAR LENS PLACEMENT (IOC);  Surgeon: Rutherford Guys, MD;  Location: [facility];  Service: Ophthalmology;  Laterality: Left;  CDE: 7.38  . COLONOSCOPY N/A 09/26/2012   Procedure: COLONOSCOPY;  Surgeon: Jamesetta So, MD;  [facility];  Service: Gastroenterology;  Laterality: N/A;  . CORONARY ARTERY BYPASS GRAFT N/A 07/15/2017   Procedure: CORONARY ARTERY BYPASS GRAFTING (CABG) X 3 USING LEFT INTERNAL MAMMARY ARTERY AND RIGHT SAPHENOUS VEIN- ENDOSCOPICALLY HARVESTED;  Surgeon: Melrose Nakayama, MD;  Location: [facility];  Service: Open Heart Surgery;  Laterality: N/A;  . IR IMAGING GUIDED PORT  INSERTION  12/29/2018  . IR REMOVAL TUN ACCESS W/ PORT W/O FL MOD SED  04/26/2019  . LEFT HEART CATH AND CORONARY ANGIOGRAPHY N/A 07/13/2017   Procedure: LEFT HEART CATH AND CORONARY ANGIOGRAPHY;  Surgeon: Martinique, Peter M, MD;  Location: [facility];  Service: Cardiovascular;  Laterality: N/A;  . LEFT HEART CATH AND CORS/GRAFTS ANGIOGRAPHY N/A 11/03/2017   Procedure: LEFT HEART CATH AND CORS/GRAFTS ANGIOGRAPHY;  Surgeon: Troy Sine, MD;  Location: [facility];  Service: Cardiovascular;  Laterality: N/A;  . TEE WITHOUT CARDIOVERSION N/A 07/15/2017   Procedure: TRANSESOPHAGEAL ECHOCARDIOGRAM (TEE);  Surgeon: Melrose Nakayama, MD;  Location: [facility];  Service: Open Heart Surgery;  Laterality: N/A;  . TUBAL LIGATION    . TYMPANOPLASTY Left    fluid from ear drum  Social History   Social History Narrative   ** Merged History Encounter **       Immunization History  Administered Date(s) Administered  . Influenza, High Dose Seasonal PF 03/08/2018, 02/14/2019  . Moderna Sars-Covid-2 Vaccination 07/26/2019, 08/24/2019, 05/01/2020  . Pneumococcal Polysaccharide-23 03/08/2018  . Tdap 12/03/2018  . Zoster Recombinat (Shingrix) 02/14/2019     Objective: Vital Signs: BP 139/76 (BP Location: Right Arm, Patient Position: Sitting, Cuff Size: Normal)   Pulse 66   Resp 17   Ht 5\' 2"  (1.575 m)   Wt 211 lb 3.2 oz (95.8 kg)   BMI 38.63 kg/m    Physical Exam Vitals and nursing note reviewed.  Constitutional:      Appearance: She is well-developed.  HENT:     Head: Normocephalic and atraumatic.  Eyes:     Conjunctiva/sclera: Conjunctivae normal.  Cardiovascular:     Rate and Rhythm: Normal rate and regular rhythm.     Heart sounds: Normal heart sounds.  Pulmonary:     Effort: Pulmonary effort is normal.     Breath sounds: Normal breath sounds.  Abdominal:     General: Bowel sounds are normal.     Palpations: Abdomen is soft.  Musculoskeletal:     Cervical back: Normal  range of motion.  Lymphadenopathy:     Cervical: No cervical adenopathy.  Skin:    General: Skin is warm and dry.     Capillary Refill: Capillary refill takes less than 2 seconds.  Neurological:     Mental Status: She is alert and oriented to person, place, and time.  Psychiatric:        Behavior: Behavior normal.      Musculoskeletal Exam: C-spine was in limited range of motion with no discomfort.  She has able to raise both of her arms with some discomfort in her arms and shoulders.  Elbow joints, wrist joints, MCPs PIPs and DIPs with good range of motion with no synovitis.  She had no difficulty getting up from the chair.  She had good strength in all 4 extremities.  No warmth or swelling was noted in her knee joints.  There was no tenderness over ankles or MTPs.  CDAI Exam: CDAI Score: -- Patient Global: --; Provider Global: -- Swollen: --; Tender: -- Joint Exam 11/12/2020   No joint exam has been documented for this visit   There is currently no information documented on the homunculus. Go to the Rheumatology activity and complete the homunculus joint exam.  Investigation: No additional findings.  Imaging: XR Cervical Spine 2 or 3 views  Result Date: 10/27/2020 Multilevel spondylosis with anterior spurring was noted.  Facet joint arthropathy was noted.  No significant disc space narrowing was noted. Impression: These findings are consistent with multilevel spondylosis and facet joint arthropathy.  XR Hand 2 View Left  Result Date: 10/27/2020 CMC, PIP and DIP narrowing was noted.  No MCP, intercarpal or radiocarpal joint space narrowing was noted.  No erosive changes were noted. Impression: These findings are consistent with osteoarthritis of the hand.  XR Hand 2 View Right  Result Date: 10/27/2020 CMC, PIP and DIP narrowing was noted.  No MCP, intercarpal or radiocarpal joint space narrowing was noted.  No erosive changes were noted. Impression: These findings are consistent  with osteoarthritis of the hand.  XR Shoulder Left  Result Date: 10/27/2020 No glenohumeral joint space narrowing was noted.  Inferior humeral spur was noted.  Acromial spurring was noted.  Acromioclavicular joint space narrowing was  noted. Impression: These findings are consistent with osteoarthritis of the shoulder joint and acromial joint.   Recent Labs: Lab Results  Component Value Date   WBC 9.3 10/16/2020   HGB 13.2 10/16/2020   PLT 224 10/16/2020   NA 143 10/16/2020   K 4.1 10/16/2020   CL 107 10/16/2020   CO2 29 10/16/2020   GLUCOSE 108 (H) 10/16/2020   BUN 10 10/16/2020   CREATININE 0.80 10/16/2020   BILITOT 0.9 10/16/2020   ALKPHOS 85 10/16/2020   AST 21 10/16/2020   ALT 18 10/16/2020   PROT 7.2 10/16/2020   ALBUMIN 3.7 10/16/2020   CALCIUM 8.9 10/16/2020   GFRAA >60 04/26/2019   Oct 27, 2020 sed rate 25, CK494, myositis panel PL7 antibody positive, RF negative, anti-CCP negative  Speciality Comments: No specialty comments available.  Procedures:  No procedures performed Allergies: Aloe vera, Sulfa antibiotics, and Sulfasalazine   Assessment / Plan:     Visit Diagnoses: Elevated CK - History of pain in bilateral arms.  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  I discussed lab results with the patient at length.  Patient states she was given a cortisone injection intramuscularly by her PCP about 2 weeks ago.  Which relieved her symptoms but now the symptoms are coming back.  She is some difficulty raising her arms due to soreness in her arm muscles.  I will repeat CK and aldolase today.- Plan: CK, Aldolase, ANA.  I will obtain MRI of her left arm to look for myositis.  I will also refer her to neurology for evaluation and EMG.  Chronic left shoulder pain - History of left shoulder pain COVID-19 vaccine.  High risk medication use -in anticipation to start her on immunosuppressive therapy I will obtain following labs.   Plan: Hepatitis B core antibody, IgM, Hepatitis B surface antigen, Hepatitis C antibody, QuantiFERON-TB Gold Plus, Serum protein electrophoresis with reflex, IgG, IgA, IgM, Thiopurine methyltransferase(tpmt)rbc  Arthritis of left acromioclavicular joint-the x-rays at the last visit showed some acromioclavicular arthritis.  She has some discomfort in her shoulder joint.  DDD (degenerative disc disease), cervical - X-ray showed multilevel spondylosis and facet joint arthropathy.  She has chronic discomfort in her cervical spine.  Essential hypertension-blood pressure is normal today.  Other medical problems are listed as follows:  NSTEMI (non-ST elevated myocardial infarction) ([place])  S/P CABG x 3  Unstable angina ([facility])  Malignant neoplasm of lower-outer quadrant of left breast of female, estrogen receptor negative ([place])  Meniere's disease of left ear  History of hypothyroidism  Family history of breast cancer  Family history of colon cancer  Family history of rheumatoid arthritis  Orders: Orders Placed This Encounter  Procedures  . MR HUMERUS LEFT WO CONTRAST  . CK  . Aldolase  . ANA  . Hepatitis B core antibody, IgM  . Hepatitis B surface antigen  . Hepatitis C antibody  . QuantiFERON-TB Gold Plus  . Serum protein electrophoresis with reflex  . IgG, IgA, IgM  . Thiopurine methyltransferase(tpmt)rbc  . Ambulatory referral to Neurology   No orders of the defined types were placed in this encounter.    Follow-Up Instructions: Return in about 3 weeks (around 12/03/2020) for elevated CK.   Bo Merino, MD  Note - This record has been created using Editor, commissioning.  Chart creation errors have been sought, but may not always  have been located. Such creation errors do not reflect on  the standard  of medical care.

## 2020-11-12 ENCOUNTER — Ambulatory Visit: Payer: Medicare HMO | Admitting: Rheumatology

## 2020-11-12 ENCOUNTER — Encounter: Payer: Self-pay | Admitting: Rheumatology

## 2020-11-12 ENCOUNTER — Other Ambulatory Visit: Payer: Self-pay

## 2020-11-12 VITALS — BP 139/76 | HR 66 | Resp 17 | Ht 62.0 in | Wt 211.2 lb

## 2020-11-12 DIAGNOSIS — M19012 Primary osteoarthritis, left shoulder: Secondary | ICD-10-CM

## 2020-11-12 DIAGNOSIS — I214 Non-ST elevation (NSTEMI) myocardial infarction: Secondary | ICD-10-CM

## 2020-11-12 DIAGNOSIS — R748 Abnormal levels of other serum enzymes: Secondary | ICD-10-CM | POA: Diagnosis not present

## 2020-11-12 DIAGNOSIS — M5030 Other cervical disc degeneration, unspecified cervical region: Secondary | ICD-10-CM | POA: Diagnosis not present

## 2020-11-12 DIAGNOSIS — M25512 Pain in left shoulder: Secondary | ICD-10-CM | POA: Diagnosis not present

## 2020-11-12 DIAGNOSIS — Z800 Family history of malignant neoplasm of digestive organs: Secondary | ICD-10-CM

## 2020-11-12 DIAGNOSIS — I10 Essential (primary) hypertension: Secondary | ICD-10-CM | POA: Diagnosis not present

## 2020-11-12 DIAGNOSIS — H8102 Meniere's disease, left ear: Secondary | ICD-10-CM

## 2020-11-12 DIAGNOSIS — C50512 Malignant neoplasm of lower-outer quadrant of left female breast: Secondary | ICD-10-CM

## 2020-11-12 DIAGNOSIS — Z803 Family history of malignant neoplasm of breast: Secondary | ICD-10-CM

## 2020-11-12 DIAGNOSIS — I200 Unstable angina: Secondary | ICD-10-CM

## 2020-11-12 DIAGNOSIS — Z951 Presence of aortocoronary bypass graft: Secondary | ICD-10-CM | POA: Diagnosis not present

## 2020-11-12 DIAGNOSIS — M79622 Pain in left upper arm: Secondary | ICD-10-CM

## 2020-11-12 DIAGNOSIS — Z79899 Other long term (current) drug therapy: Secondary | ICD-10-CM

## 2020-11-12 DIAGNOSIS — Z171 Estrogen receptor negative status [ER-]: Secondary | ICD-10-CM

## 2020-11-12 DIAGNOSIS — G8929 Other chronic pain: Secondary | ICD-10-CM

## 2020-11-12 DIAGNOSIS — Z8261 Family history of arthritis: Secondary | ICD-10-CM

## 2020-11-12 DIAGNOSIS — Z8639 Personal history of other endocrine, nutritional and metabolic disease: Secondary | ICD-10-CM

## 2020-11-13 NOTE — Progress Notes (Signed)
Repeat CK is normal.

## 2020-11-16 NOTE — Progress Notes (Signed)
Aldolase is normal, ANA is negative, hepatitis B and C are negative, SPEP is normal, TB gold is negative, immunoglobulins are normal.

## 2020-11-18 ENCOUNTER — Telehealth: Payer: Self-pay

## 2020-11-18 NOTE — Telephone Encounter (Signed)
Patient called stating she was returning a missed call from our office.

## 2020-11-18 NOTE — Telephone Encounter (Signed)
I called patient 

## 2020-11-24 ENCOUNTER — Ambulatory Visit: Payer: Medicare HMO | Admitting: Rheumatology

## 2020-11-24 LAB — CK: Total CK: 89 U/L (ref 29–143)

## 2020-11-24 LAB — QUANTIFERON-TB GOLD PLUS
Mitogen-NIL: 5.4 IU/mL
NIL: 0.02 IU/mL
QuantiFERON-TB Gold Plus: NEGATIVE
TB1-NIL: 0.01 IU/mL
TB2-NIL: 0 IU/mL

## 2020-11-24 LAB — PROTEIN ELECTROPHORESIS, SERUM, WITH REFLEX
Albumin ELP: 3.4 g/dL — ABNORMAL LOW (ref 3.8–4.8)
Alpha 1: 0.3 g/dL (ref 0.2–0.3)
Alpha 2: 0.9 g/dL (ref 0.5–0.9)
Beta 2: 0.4 g/dL (ref 0.2–0.5)
Beta Globulin: 0.5 g/dL (ref 0.4–0.6)
Gamma Globulin: 1.2 g/dL (ref 0.8–1.7)
Total Protein: 6.7 g/dL (ref 6.1–8.1)

## 2020-11-24 LAB — HEPATITIS C ANTIBODY
Hepatitis C Ab: NONREACTIVE
SIGNAL TO CUT-OFF: 0.02 (ref <1.00)

## 2020-11-24 LAB — ANA: Anti Nuclear Antibody (ANA): NEGATIVE

## 2020-11-24 LAB — IGG, IGA, IGM
IgG (Immunoglobin G), Serum: 1185 mg/dL (ref 600–1540)
IgM, Serum: 171 mg/dL (ref 50–300)
Immunoglobulin A: 290 mg/dL (ref 70–320)

## 2020-11-24 LAB — THIOPURINE METHYLTRANSFERASE (TPMT), RBC: Thiopurine Methyltransferase, RBC: 10 nmol/hr/mL RBC — ABNORMAL LOW

## 2020-11-24 LAB — ALDOLASE: Aldolase: 4.6 U/L (ref <=8.1)

## 2020-11-24 LAB — HEPATITIS B SURFACE ANTIGEN: Hepatitis B Surface Ag: NONREACTIVE

## 2020-11-24 LAB — HEPATITIS B CORE ANTIBODY, IGM: Hep B C IgM: NONREACTIVE

## 2020-11-27 ENCOUNTER — Other Ambulatory Visit: Payer: Self-pay

## 2020-11-27 ENCOUNTER — Ambulatory Visit (HOSPITAL_COMMUNITY)
Admission: RE | Admit: 2020-11-27 | Discharge: 2020-11-27 | Disposition: A | Discharge status: Home / Self Care | Payer: Medicare HMO | Source: Ambulatory Visit | Attending: Rheumatology | Admitting: Rheumatology

## 2020-11-27 DIAGNOSIS — G8929 Other chronic pain: Secondary | ICD-10-CM | POA: Insufficient documentation

## 2020-11-27 DIAGNOSIS — M19012 Primary osteoarthritis, left shoulder: Secondary | ICD-10-CM | POA: Diagnosis not present

## 2020-11-27 DIAGNOSIS — R748 Abnormal levels of other serum enzymes: Secondary | ICD-10-CM | POA: Diagnosis not present

## 2020-11-27 DIAGNOSIS — M79622 Pain in left upper arm: Secondary | ICD-10-CM

## 2020-11-27 DIAGNOSIS — M25512 Pain in left shoulder: Secondary | ICD-10-CM | POA: Insufficient documentation

## 2020-11-27 MED ORDER — ATORVASTATIN CALCIUM 80 MG PO TABS: ORAL_TABLET | ORAL | 3 refills | Status: DC

## 2020-11-27 NOTE — Telephone Encounter (Signed)
Refilled Atorvastatin 80 mg daily #90 with RF:3 to [facility]

## 2020-11-29 NOTE — Progress Notes (Signed)
I tried to reach patient but could not leave a voicemail.  I will try to reach her again on Monday.

## 2020-12-01 NOTE — Progress Notes (Signed)
I discussed MRI results with the patient.  Please a schedule an appointment for cortisone injection and to discuss MRI results at length.

## 2020-12-02 NOTE — Progress Notes (Signed)
Office Visit Note  Patient: Gloria Mccoy             Date of Birth: [birth year]           MRN: [id number]             PCP: Redmond School, MD Referring: Redmond School, MD Visit Date: 12/03/2020 Occupation: @GUAROCC @  Subjective:  Left shoulder pain.   History of Present Illness: Gloria Mccoy is a 75 y.o. female history of left shoulder pain and elevated muscle enzyme.  Her repeat muscle enzyme test was normal.  She had MRI of her left shoulder joint which showed glenohumeral and acromioclavicular joint arthritis and tendinopathy.  She continues to have discomfort in her left shoulder and some discomfort in her neck region.  She has a stiffness in her hands.  She denies any muscle weakness or muscle pain currently.  Activities of Daily Living:  Patient reports morning stiffness for all day. Patient Denies nocturnal pain.  Difficulty dressing/grooming: Reports Difficulty climbing stairs: Denies Difficulty getting out of chair: Denies Difficulty using hands for taps, buttons, cutlery, and/or writing: Reports  Review of Systems  Constitutional:  Positive for fatigue.  HENT:  Negative for mouth sores, mouth dryness and nose dryness.   Eyes:  Negative for pain, itching and dryness.  Respiratory:  Positive for wheezing. Negative for shortness of breath and difficulty breathing.   Cardiovascular:  Negative for chest pain and palpitations.  Gastrointestinal:  Negative for blood in stool, constipation and diarrhea.  Endocrine: Negative for increased urination.  Genitourinary:  Negative for difficulty urinating.  Musculoskeletal:  Positive for myalgias, morning stiffness, muscle tenderness and myalgias. Negative for joint pain, joint pain and joint swelling.  Skin:  Negative for color change, rash and redness.  Allergic/Immunologic: Negative for susceptible to infections.  Neurological:  Positive for numbness. Negative for dizziness, headaches, memory loss and weakness.   Hematological:  Negative for bruising/bleeding tendency.  Psychiatric/Behavioral:  Negative for confusion.    PMFS History:  Patient Active Problem List   Diagnosis Date Noted   Port-A-Cath in place 01/16/2019   Genetic testing 08/29/2018   Family history of breast cancer    Family history of colon cancer    Malignant neoplasm of lower-outer quadrant of left breast of female, estrogen receptor negative ([place]) 08/16/2018   NSTEMI (non-ST elevated myocardial infarction) ([place]) 11/02/2017   S/P CABG x 3 07/15/2017   Coronary artery disease 07/15/2017   Unstable angina ([place]) 07/13/2017   Essential hypertension 07/13/2017   Obesity 07/13/2017   Hyperglycemia 07/13/2017   Hypothyroid 01/06/2012   Meniere disease    Arthritis     Past Medical History:  Diagnosis Date   Arthritis    "hands sometimes" (07/13/2017)   Coronary artery disease    a. s/p CABG x3 in 06/2017 with LIMA-LAD, SVG-D1, and SVG-RI.  b. 10/2017: cath showing a widely patent LIMA-LAD with ostial occlusion of the SVG-RI and subtotally occluded atretic SVG-D1. Graft occlusion thought to be 2ry to improvement in pre-CABG stenoses.    Essential hypertension    Family history of adverse reaction to anesthesia    sister had a complication that was stated she had a "foggy" episode after her breast surgery was readmitted 1 day post op after being discharged from the hospital. Sister also has significant lung problems that could have contributed to this    Family history of breast cancer    Family history of colon cancer    Hypothyroid  Meniere disease    Myocardial infarction ([place]) 06/2017   during [facility] rehab   Obesity    Osteopenia 01/2012   T score -1.3 FRAX 7.9%/0.6%    Family History  Problem Relation Age of Onset   Diabetes Sister        AODM   Heart disease Sister 102       CABG   Breast cancer Sister    Heart disease Brother 28       In his 64s   Colon cancer Maternal Uncle 48   Diabetes Sister         AODM   Hypertension Daughter    Hypertension Daughter    Heart attack Father        In his 19s   Stroke Mother    Stroke Maternal Grandmother    Diabetes Maternal Grandfather        d. 109   Heart attack Paternal Grandmother    Colon cancer Maternal Uncle 77   Breast cancer Cousin        dx 72s; d. 49s   Breast cancer Niece 52   Past Surgical History:  Procedure Laterality Date   BREAST LUMPECTOMY WITH RADIOACTIVE SEED AND SENTINEL LYMPH NODE BIOPSY Left 09/01/2018   Procedure: LEFT BREAST LUMPECTOMY WITH RADIOACTIVE SEED AND SENTINEL LYMPH NODE BIOPSY;  Surgeon: Jovita Kussmaul, MD;  Location: [facility];  Service: General;  Laterality: Left;   BREAST SURGERY     CARDIAC CATHETERIZATION  07/13/2017   CATARACT EXTRACTION W/PHACO Right 01/28/2015   Procedure: CATARACT EXTRACTION PHACO AND INTRAOCULAR LENS PLACEMENT :  CDE:  5.70;  Surgeon: Rutherford Guys, MD;  Location: [facility];  Service: Ophthalmology;  Laterality: Right;   CATARACT EXTRACTION W/PHACO Left 02/11/2015   Procedure: CATARACT EXTRACTION PHACO AND INTRAOCULAR LENS PLACEMENT (IOC);  Surgeon: Rutherford Guys, MD;  Location: [facility];  Service: Ophthalmology;  Laterality: Left;  CDE: 7.38   COLONOSCOPY N/A 09/26/2012   Procedure: COLONOSCOPY;  Surgeon: Jamesetta So, MD;  [facility];  Service: Gastroenterology;  Laterality: N/A;   CORONARY ARTERY BYPASS GRAFT N/A 07/15/2017   Procedure: CORONARY ARTERY BYPASS GRAFTING (CABG) X 3 USING LEFT INTERNAL MAMMARY ARTERY AND RIGHT SAPHENOUS VEIN- ENDOSCOPICALLY HARVESTED;  Surgeon: Melrose Nakayama, MD;  Location: [facility];  Service: Open Heart Surgery;  Laterality: N/A;   IR IMAGING GUIDED PORT INSERTION  12/29/2018   IR REMOVAL TUN ACCESS W/ PORT W/O FL MOD SED  04/26/2019   LEFT HEART CATH AND CORONARY ANGIOGRAPHY N/A 07/13/2017   Procedure: LEFT HEART CATH AND CORONARY ANGIOGRAPHY;  Surgeon: Martinique, Peter M, MD;  Location: [facility];  Service: Cardiovascular;  Laterality: N/A;    LEFT HEART CATH AND CORS/GRAFTS ANGIOGRAPHY N/A 11/03/2017   Procedure: LEFT HEART CATH AND CORS/GRAFTS ANGIOGRAPHY;  Surgeon: Troy Sine, MD;  Location: [facility];  Service: Cardiovascular;  Laterality: N/A;   TEE WITHOUT CARDIOVERSION N/A 07/15/2017   Procedure: TRANSESOPHAGEAL ECHOCARDIOGRAM (TEE);  Surgeon: Melrose Nakayama, MD;  Location: [facility];  Service: Open Heart Surgery;  Laterality: N/A;   TUBAL LIGATION     TYMPANOPLASTY Left    fluid from ear drum   Social History   Social History Narrative   ** Merged History Encounter **       Immunization History  Administered Date(s) Administered   Influenza, High Dose Seasonal PF 03/08/2018, 02/14/2019   Moderna Sars-Covid-2 Vaccination 07/26/2019, 08/24/2019, 05/01/2020   Pneumococcal Polysaccharide-23 03/08/2018  Tdap 12/03/2018   Zoster Recombinat (Shingrix) 02/14/2019     Objective: Vital Signs: BP 124/72 (BP Location: Right Arm, Patient Position: Sitting, Cuff Size: Normal)   Pulse 60   Resp 16   Ht 5\' 2"  (1.575 m)   Wt 207 lb (93.9 kg)   BMI 37.86 kg/m    Physical Exam Vitals and nursing note reviewed.  Constitutional:      Appearance: She is well-developed.  HENT:     Head: Normocephalic and atraumatic.  Eyes:     Conjunctiva/sclera: Conjunctivae normal.  Cardiovascular:     Rate and Rhythm: Normal rate and regular rhythm.     Heart sounds: Normal heart sounds.  Pulmonary:     Effort: Pulmonary effort is normal.     Breath sounds: Normal breath sounds.  Abdominal:     General: Bowel sounds are normal.     Palpations: Abdomen is soft.  Musculoskeletal:     Cervical back: Normal range of motion.  Lymphadenopathy:     Cervical: No cervical adenopathy.  Skin:    General: Skin is warm and dry.     Capillary Refill: Capillary refill takes less than 2 seconds.  Neurological:     Mental Status: She is alert and oriented to person, place, and time.  Psychiatric:        Behavior: Behavior  normal.     Musculoskeletal Exam: She had stiffness with range of motion of her cervical spine.  She had painful range of motion of her left shoulder joint with complete abduction and limited internal rotation.  No warmth swelling or effusion was noted.  Elbow joints, wrist joints with good range of motion.  She had bilateral PIP and DIP thickening with no synovitis.  Hip joints and knee joints with good range of motion.  There was no tenderness over ankles or MTPs.  She had no muscular weakness.  CDAI Exam: CDAI Score: -- Patient Global: --; Provider Global: -- Swollen: --; Tender: -- Joint Exam 12/03/2020   No joint exam has been documented for this visit   There is currently no information documented on the homunculus. Go to the Rheumatology activity and complete the homunculus joint exam.  Investigation: No additional findings.  Imaging: MR HUMERUS LEFT WO CONTRAST  Result Date: 11/28/2020 CLINICAL DATA:  Upper extremity pain, non-localized (Ped 0-18y) Left arm pain, increased CK, myositis abnormal positive EXAM: MRI OF THE LEFT HUMERUS WITHOUT CONTRAST TECHNIQUE: Multiplanar, multisequence MR imaging of the left humerus was performed. No intravenous contrast was administered. COMPARISON:  None. FINDINGS: Bones/Joint/Cartilage There is no evidence of fracture. There is no significant marrow signal alteration. Severe glenohumeral osteoarthritis with subchondral sclerosis, cystic change, and joint space narrowing with chondral loss. Partially visualized likely intra-articular long head biceps tendinopathy, distal rotator cuff disease, and degenerative labral tearing. There is a low intensity osteochondral body adjacent to the long head biceps tendon within the bicipital groove measuring 4 mm. (Axial stir image 8). Mild to moderate AC joint arthropathy. Ligaments Grossly intact. Muscles and Tendons No significant muscle atrophy or edema. Soft tissues There is no focal fluid collection or soft  tissue edema. IMPRESSION: No evidence of myositis in the left upper extremity. No acute osseous abnormality. Severe glenohumeral osteoarthritis with likely distal rotator cuff disease, intra-articular long head biceps tendinopathy, and degenerative labral tearing, partially visualized. Osteochondral joint body adjacent to the long head biceps tendon within the bicipital groove measuring 5 mm. Electronically Signed   By: Maurine Simmering   On:  11/28/2020 08:45    Recent Labs: Lab Results  Component Value Date   WBC 9.3 10/16/2020   HGB 13.2 10/16/2020   PLT 224 10/16/2020   NA 143 10/16/2020   K 4.1 10/16/2020   CL 107 10/16/2020   CO2 29 10/16/2020   GLUCOSE 108 (H) 10/16/2020   BUN 10 10/16/2020   CREATININE 0.80 10/16/2020   BILITOT 0.9 10/16/2020   ALKPHOS 85 10/16/2020   AST 21 10/16/2020   ALT 18 10/16/2020   PROT 6.7 11/12/2020   ALBUMIN 3.7 10/16/2020   CALCIUM 8.9 10/16/2020   GFRAA >60 04/26/2019   QFTBGOLDPLUS NEGATIVE 11/12/2020    Speciality Comments: No specialty comments available.  Procedures:  Large Joint Inj: L glenohumeral on 12/03/2020 9:07 AM Indications: pain Details: 27 G 1.5 in posterior  Arthrogram: No  Medications: 1.5 mL lidocaine 1 %; 40 mg triamcinolone acetonide 40 MG/ML Aspirate: 0 mL Outcome: tolerated well, no immediate complications   Allergies: Aloe vera, Sulfa antibiotics, and Sulfasalazine   Assessment / Plan:     Visit Diagnoses: Elevated CK - History of pain in bilateral arms.  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  Repeat CK was normal at 87.  She had MRI of her left shoulder which was negative for myositis.  I had detailed discussion with the patient and her daughter Paxy.  Left findings were discussed with the patient and her daughter.  Because she has PL 7 myositis antibody I advised her to have routine malignancy screening which should include mammogram, colonoscopy and pelvic  examination.  We will schedule appointment with her GYN and gastroenterologist.  Chronic left shoulder pain - History of left shoulder pain COVID-19 vaccine.  MRI of the left shoulder joint was obtained which was consistent with severe glenohumeral osteoarthritis and biceps tendinopathy.  MRI findings were discussed with the patient and her daughter.  She has limited range of motion of her left shoulder joint and has ongoing discomfort.  After informed consent was obtained and different treatment options were discussed left shoulder joint was injected with cortisone as described above.  She tolerated the procedure well.  I have also given her a handout on shoulder joint exercises.  I will refer her to physical therapy.  Arthritis of left acromioclavicular joint - x-rays showed some acromioclavicular arthritis  Primary osteoarthritis of both hands-she complains of stiffness in her hands.  She had bilateral PIP and DIP thickening consistent with osteoarthritis.  Joint protection muscle strengthening was discussed.  A handout on hand exercises was given.  DDD (degenerative disc disease), cervical - X-ray showed multilevel spondylosis and facet joint arthropathy.  She has a stiffness in her cervical region.  Essential hypertension-blood pressure was normal today.  Other medical problems are listed as follows:  NSTEMI (non-ST elevated myocardial infarction) ([place])  Unstable angina ([facility])  S/P CABG x 3  Malignant neoplasm of lower-outer quadrant of left breast of female, estrogen receptor negative ([place])  Meniere's disease of left ear  History of hypothyroidism  Family history of colon cancer  Family history of breast cancer  Family history of rheumatoid arthritis  Orders: Orders Placed This Encounter  Procedures   Large Joint Inj: L glenohumeral   Ambulatory referral to Physical Therapy    No orders of the defined types were placed in this encounter.    Follow-Up Instructions:  Return in about 3 months (around 03/05/2021).   Bo Merino, MD  Note - This record  has been created using [employer].  Chart creation errors have been sought, but may not always  have been located. Such creation errors do not reflect on  the standard of medical care.

## 2020-12-03 ENCOUNTER — Other Ambulatory Visit: Payer: Self-pay

## 2020-12-03 ENCOUNTER — Ambulatory Visit: Payer: Medicare HMO | Admitting: Rheumatology

## 2020-12-03 ENCOUNTER — Encounter: Payer: Self-pay | Admitting: Rheumatology

## 2020-12-03 VITALS — BP 124/72 | HR 60 | Resp 16 | Ht 62.0 in | Wt 207.0 lb

## 2020-12-03 DIAGNOSIS — R748 Abnormal levels of other serum enzymes: Secondary | ICD-10-CM | POA: Diagnosis not present

## 2020-12-03 DIAGNOSIS — C50512 Malignant neoplasm of lower-outer quadrant of left female breast: Secondary | ICD-10-CM

## 2020-12-03 DIAGNOSIS — M25512 Pain in left shoulder: Secondary | ICD-10-CM | POA: Diagnosis not present

## 2020-12-03 DIAGNOSIS — Z803 Family history of malignant neoplasm of breast: Secondary | ICD-10-CM

## 2020-12-03 DIAGNOSIS — Z8639 Personal history of other endocrine, nutritional and metabolic disease: Secondary | ICD-10-CM

## 2020-12-03 DIAGNOSIS — Z800 Family history of malignant neoplasm of digestive organs: Secondary | ICD-10-CM

## 2020-12-03 DIAGNOSIS — I10 Essential (primary) hypertension: Secondary | ICD-10-CM

## 2020-12-03 DIAGNOSIS — M19012 Primary osteoarthritis, left shoulder: Secondary | ICD-10-CM

## 2020-12-03 DIAGNOSIS — Z951 Presence of aortocoronary bypass graft: Secondary | ICD-10-CM

## 2020-12-03 DIAGNOSIS — I214 Non-ST elevation (NSTEMI) myocardial infarction: Secondary | ICD-10-CM | POA: Diagnosis not present

## 2020-12-03 DIAGNOSIS — Z79899 Other long term (current) drug therapy: Secondary | ICD-10-CM | POA: Diagnosis not present

## 2020-12-03 DIAGNOSIS — I200 Unstable angina: Secondary | ICD-10-CM

## 2020-12-03 DIAGNOSIS — Z8261 Family history of arthritis: Secondary | ICD-10-CM

## 2020-12-03 DIAGNOSIS — M5030 Other cervical disc degeneration, unspecified cervical region: Secondary | ICD-10-CM

## 2020-12-03 DIAGNOSIS — M19041 Primary osteoarthritis, right hand: Secondary | ICD-10-CM

## 2020-12-03 DIAGNOSIS — G8929 Other chronic pain: Secondary | ICD-10-CM

## 2020-12-03 DIAGNOSIS — M19042 Primary osteoarthritis, left hand: Secondary | ICD-10-CM

## 2020-12-03 DIAGNOSIS — H8102 Meniere's disease, left ear: Secondary | ICD-10-CM

## 2020-12-03 DIAGNOSIS — Z171 Estrogen receptor negative status [ER-]: Secondary | ICD-10-CM

## 2020-12-03 MED ORDER — TRIAMCINOLONE ACETONIDE 40 MG/ML IJ SUSP
40.0000 mg | INTRAMUSCULAR | Status: AC | PRN
  Administered 2020-12-03: 40 mg via INTRA_ARTICULAR

## 2020-12-03 MED ORDER — LIDOCAINE HCL 1 % IJ SOLN
1.5000 mL | INTRAMUSCULAR | Status: AC | PRN
  Administered 2020-12-03: 1.5 mL

## 2020-12-03 NOTE — Patient Instructions (Signed)
Hand Exercises Hand exercises can be helpful for almost anyone. These exercises can strengthen the hands, improve flexibility and movement, and increase blood flow to the hands. These results can make work and daily tasks easier. Hand exercises can be especially helpful for people who have joint pain from arthritis or have nerve damage from overuse (carpal tunnel syndrome). These exercises can also help people who have injured a hand. Exercises Most of these hand exercises are gentle stretching and motion exercises. It is usually safe to do them often throughout the day. Warming up your hands before exercise may help to reduce stiffness. You can do this with gentle massage orby placing your hands in warm water for 10-15 minutes. It is normal to feel some stretching, pulling, tightness, or mild discomfort as you begin new exercises. This will gradually improve. Stop an exercise right away if you feel sudden, severe pain or your pain gets worse. Ask your healthcare provider which exercises are best for you. Knuckle bend or "claw" fist Stand or sit with your arm, hand, and all five fingers pointed straight up. Make sure to keep your wrist straight during the exercise. Gently bend your fingers down toward your palm until the tips of your fingers are touching the top of your palm. Keep your big knuckle straight and just bend the small knuckles in your fingers. Hold this position for __________ seconds. Straighten (extend) your fingers back to the starting position. Repeat this exercise 5-10 times with each hand. Full finger fist Stand or sit with your arm, hand, and all five fingers pointed straight up. Make sure to keep your wrist straight during the exercise. Gently bend your fingers into your palm until the tips of your fingers are touching the middle of your palm. Hold this position for __________ seconds. Extend your fingers back to the starting position, stretching every joint fully. Repeat this  exercise 5-10 times with each hand. Straight fist Stand or sit with your arm, hand, and all five fingers pointed straight up. Make sure to keep your wrist straight during the exercise. Gently bend your fingers at the big knuckle, where your fingers meet your hand, and the middle knuckle. Keep the knuckle at the tips of your fingers straight and try to touch the bottom of your palm. Hold this position for __________ seconds. Extend your fingers back to the starting position, stretching every joint fully. Repeat this exercise 5-10 times with each hand. Tabletop Stand or sit with your arm, hand, and all five fingers pointed straight up. Make sure to keep your wrist straight during the exercise. Gently bend your fingers at the big knuckle, where your fingers meet your hand, as far down as you can while keeping the small knuckles in your fingers straight. Think of forming a tabletop with your fingers. Hold this position for __________ seconds. Extend your fingers back to the starting position, stretching every joint fully. Repeat this exercise 5-10 times with each hand. Finger spread Place your hand flat on a table with your palm facing down. Make sure your wrist stays straight as you do this exercise. Spread your fingers and thumb apart from each other as far as you can until you feel a gentle stretch. Hold this position for __________ seconds. Bring your fingers and thumb tight together again. Hold this position for __________ seconds. Repeat this exercise 5-10 times with each hand. Making circles Stand or sit with your arm, hand, and all five fingers pointed straight up. Make sure to keep your   wrist straight during the exercise. Make a circle by touching the tip of your thumb to the tip of your index finger. Hold for __________ seconds. Then open your hand wide. Repeat this motion with your thumb and each finger on your hand. Repeat this exercise 5-10 times with each hand. Thumb motion Sit  with your forearm resting on a table and your wrist straight. Your thumb should be facing up toward the ceiling. Keep your fingers relaxed as you move your thumb. Lift your thumb up as high as you can toward the ceiling. Hold for __________ seconds. Bend your thumb across your palm as far as you can, reaching the tip of your thumb for the small finger (pinkie) side of your palm. Hold for __________ seconds. Repeat this exercise 5-10 times with each hand. Grip strengthening  Hold a stress ball or other soft ball in the middle of your hand. Slowly increase the pressure, squeezing the ball as much as you can without causing pain. Think of bringing the tips of your fingers into the middle of your palm. All of your finger joints should bend when doing this exercise. Hold your squeeze for __________ seconds, then relax. Repeat this exercise 5-10 times with each hand. Contact a health care provider if: Your hand pain or discomfort gets much worse when you do an exercise. Your hand pain or discomfort does not improve within 2 hours after you exercise. If you have any of these problems, stop doing these exercises right away. Do not do them again unless your health care provider says that you can. Get help right away if: You develop sudden, severe hand pain or swelling. If this happens, stop doing these exercises right away. Do not do them again unless your health care provider says that you can. This information is not intended to replace advice given to you by your health care provider. Make sure you discuss any questions you have with your healthcare provider. Document Revised: 09/28/2018 Document Reviewed: 06/08/2018 [employer] Patient Education  [place]. Shoulder Exercises Ask your health care provider which exercises are safe for you. Do exercises exactly as told by your health care provider and adjust them as directed. It is normal to feel mild stretching, pulling, tightness, or discomfort  as you do these exercises. Stop right away if you feel sudden pain or your pain gets worse. Do not begin these exercises until told by your health care provider. Stretching exercises External rotation and abduction This exercise is sometimes called corner stretch. This exercise rotates your arm outward (external rotation) and moves your arm out from your body (abduction). Stand in a doorway with one of your feet slightly in front of the other. This is called a staggered stance. If you cannot reach your forearms to the door frame, stand facing a corner of a room. Choose one of the following positions as told by your health care provider: Place your hands and forearms on the door frame above your head. Place your hands and forearms on the door frame at the height of your head. Place your hands on the door frame at the height of your elbows. Slowly move your weight onto your front foot until you feel a stretch across your chest and in the front of your shoulders. Keep your head and chest upright and keep your abdominal muscles tight. Hold for __________ seconds. To release the stretch, shift your weight to your back foot. Repeat __________ times. Complete this exercise __________ times a day. Extension,  standing Stand and hold a broomstick, a cane, or a similar object behind your back. Your hands should be a little wider than shoulder width apart. Your palms should face away from your back. Keeping your elbows straight and your shoulder muscles relaxed, move the stick away from your body until you feel a stretch in your shoulders (extension). Avoid shrugging your shoulders while you move the stick. Keep your shoulder blades tucked down toward the middle of your back. Hold for __________ seconds. Slowly return to the starting position. Repeat __________ times. Complete this exercise __________ times a day. Range-of-motion exercises Pendulum  Stand near a wall or a surface that you can hold onto  for balance. Bend at the waist and let your left / right arm hang straight down. Use your other arm to support you. Keep your back straight and do not lock your knees. Relax your left / right arm and shoulder muscles, and move your hips and your trunk so your left / right arm swings freely. Your arm should swing because of the motion of your body, not because you are using your arm or shoulder muscles. Keep moving your hips and trunk so your arm swings in the following directions, as told by your health care provider: Side to side. Forward and backward. In clockwise and counterclockwise circles. Continue each motion for __________ seconds, or for as long as told by your health care provider. Slowly return to the starting position. Repeat __________ times. Complete this exercise __________ times a day. Shoulder flexion, standing  Stand and hold a broomstick, a cane, or a similar object. Place your hands a little more than shoulder width apart on the object. Your left / right hand should be palm up, and your other hand should be palm down. Keep your elbow straight and your shoulder muscles relaxed. Push the stick up with your healthy arm to raise your left / right arm in front of your body, and then over your head until you feel a stretch in your shoulder (flexion). Avoid shrugging your shoulder while you raise your arm. Keep your shoulder blade tucked down toward the middle of your back. Hold for __________ seconds. Slowly return to the starting position. Repeat __________ times. Complete this exercise __________ times a day. Shoulder abduction, standing Stand and hold a broomstick, a cane, or a similar object. Place your hands a little more than shoulder width apart on the object. Your left / right hand should be palm up, and your other hand should be palm down. Keep your elbow straight and your shoulder muscles relaxed. Push the object across your body toward your left / right side. Raise your  left / right arm to the side of your body (abduction) until you feel a stretch in your shoulder. Do not raise your arm above shoulder height unless your health care provider tells you to do that. If directed, raise your arm over your head. Avoid shrugging your shoulder while you raise your arm. Keep your shoulder blade tucked down toward the middle of your back. Hold for __________ seconds. Slowly return to the starting position. Repeat __________ times. Complete this exercise __________ times a day. Internal rotation  Place your left / right hand behind your back, palm up. Use your other hand to dangle an exercise band, a towel, or a similar object over your shoulder. Grasp the band with your left / right hand so you are holding on to both ends. Gently pull up on the band until you  feel a stretch in the front of your left / right shoulder. The movement of your arm toward the center of your body is called internal rotation. Avoid shrugging your shoulder while you raise your arm. Keep your shoulder blade tucked down toward the middle of your back. Hold for __________ seconds. Release the stretch by letting go of the band and lowering your hands. Repeat __________ times. Complete this exercise __________ times a day. Strengthening exercises External rotation  Sit in a stable chair without armrests. Secure an exercise band to a stable object at elbow height on your left / right side. Place a soft object, such as a folded towel or a small pillow, between your left / right upper arm and your body to move your elbow about 4 inches (10 cm) away from your side. Hold the end of the exercise band so it is tight and there is no slack. Keeping your elbow pressed against the soft object, slowly move your forearm out, away from your abdomen (external rotation). Keep your body steady so only your forearm moves. Hold for __________ seconds. Slowly return to the starting position. Repeat __________ times.  Complete this exercise __________ times a day. Shoulder abduction  Sit in a stable chair without armrests, or stand up. Hold a __________ weight in your left / right hand, or hold an exercise band with both hands. Start with your arms straight down and your left / right palm facing in, toward your body. Slowly lift your left / right hand out to your side (abduction). Do not lift your hand above shoulder height unless your health care provider tells you that this is safe. Keep your arms straight. Avoid shrugging your shoulder while you do this movement. Keep your shoulder blade tucked down toward the middle of your back. Hold for __________ seconds. Slowly lower your arm, and return to the starting position. Repeat __________ times. Complete this exercise __________ times a day. Shoulder extension Sit in a stable chair without armrests, or stand up. Secure an exercise band to a stable object in front of you so it is at shoulder height. Hold one end of the exercise band in each hand. Your palms should face each other. Straighten your elbows and lift your hands up to shoulder height. Step back, away from the secured end of the exercise band, until the band is tight and there is no slack. Squeeze your shoulder blades together as you pull your hands down to the sides of your thighs (extension). Stop when your hands are straight down by your sides. Do not let your hands go behind your body. Hold for __________ seconds. Slowly return to the starting position. Repeat __________ times. Complete this exercise __________ times a day. Shoulder row Sit in a stable chair without armrests, or stand up. Secure an exercise band to a stable object in front of you so it is at waist height. Hold one end of the exercise band in each hand. Position your palms so that your thumbs are facing the ceiling (neutral position). Bend each of your elbows to a 90-degree angle (right angle) and keep your upper arms at your  sides. Step back until the band is tight and there is no slack. Slowly pull your elbows back behind you. Hold for __________ seconds. Slowly return to the starting position. Repeat __________ times. Complete this exercise __________ times a day. Shoulder press-ups  Sit in a stable chair that has armrests. Sit upright, with your feet flat on the floor.  Put your hands on the armrests so your elbows are bent and your fingers are pointing forward. Your hands should be about even with the sides of your body. Push down on the armrests and use your arms to lift yourself off the chair. Straighten your elbows and lift yourself up as much as you comfortably can. Move your shoulder blades down, and avoid letting your shoulders move up toward your ears. Keep your feet on the ground. As you get stronger, your feet should support less of your body weight as you lift yourself up. Hold for __________ seconds. Slowly lower yourself back into the chair. Repeat __________ times. Complete this exercise __________ times a day. Wall push-ups  Stand so you are facing a stable wall. Your feet should be about one arm-length away from the wall. Lean forward and place your palms on the wall at shoulder height. Keep your feet flat on the floor as you bend your elbows and lean forward toward the wall. Hold for __________ seconds. Straighten your elbows to push yourself back to the starting position. Repeat __________ times. Complete this exercise __________ times a day. This information is not intended to replace advice given to you by your health care provider. Make sure you discuss any questions you have with your healthcare provider. Document Revised: 09/29/2018 Document Reviewed: 07/07/2018 [employer] Patient Education  [place].

## 2020-12-09 ENCOUNTER — Other Ambulatory Visit: Payer: Self-pay | Admitting: Student

## 2020-12-24 ENCOUNTER — Other Ambulatory Visit: Payer: Self-pay | Admitting: Student

## 2020-12-30 ENCOUNTER — Other Ambulatory Visit: Payer: Self-pay

## 2020-12-30 ENCOUNTER — Ambulatory Visit (HOSPITAL_COMMUNITY): Payer: Medicare HMO | Attending: Rheumatology | Admitting: Physical Therapy

## 2020-12-30 DIAGNOSIS — M25512 Pain in left shoulder: Secondary | ICD-10-CM | POA: Insufficient documentation

## 2020-12-30 DIAGNOSIS — M6281 Muscle weakness (generalized): Secondary | ICD-10-CM

## 2020-12-30 DIAGNOSIS — M542 Cervicalgia: Secondary | ICD-10-CM | POA: Diagnosis not present

## 2020-12-30 DIAGNOSIS — M25612 Stiffness of left shoulder, not elsewhere classified: Secondary | ICD-10-CM | POA: Insufficient documentation

## 2020-12-30 NOTE — Therapy (Signed)
[facility] [place], [place], [address] Phone: [phone]   Fax:  [phone]  Physical Therapy Evaluation  Patient Details  Name: Gloria Mccoy MRN: [id number] Date of Birth: [birth year] Referring Provider (PT): Bo Merino MD   Encounter Date: 12/30/2020   PT End of Session - 12/30/20 0739     Visit Number 1    Number of Visits 8    Date for PT Re-Evaluation 02/24/21    Authorization Type humana medicare - auth required, no VL    Progress Note Due on Visit 10    PT Start Time 0745    PT Stop Time 0819    PT Time Calculation (min) 34 min    Activity Tolerance Patient tolerated treatment well             Past Medical History:  Diagnosis Date   Arthritis    "hands sometimes" (07/13/2017)   Coronary artery disease    a. s/p CABG x3 in 06/2017 with LIMA-LAD, SVG-D1, and SVG-RI.  b. 10/2017: cath showing a widely patent LIMA-LAD with ostial occlusion of the SVG-RI and subtotally occluded atretic SVG-D1. Graft occlusion thought to be 2ry to improvement in pre-CABG stenoses.    Essential hypertension    Family history of adverse reaction to anesthesia    sister had a complication that was stated she had a "foggy" episode after her breast surgery was readmitted 1 day post op after being discharged from the hospital. Sister also has significant lung problems that could have contributed to this    Family history of breast cancer    Family history of colon cancer    Hypothyroid    Meniere disease    Myocardial infarction ([place]) 06/2017   during cardaic rehab   Obesity    Osteopenia 01/2012   T score -1.3 FRAX 7.9%/0.6%    Past Surgical History:  Procedure Laterality Date   BREAST LUMPECTOMY WITH RADIOACTIVE SEED AND SENTINEL LYMPH NODE BIOPSY Left 09/01/2018   Procedure: LEFT BREAST LUMPECTOMY WITH RADIOACTIVE SEED AND SENTINEL LYMPH NODE BIOPSY;  Surgeon: Jovita Kussmaul, MD;  Location: [facility];  Service: General;   Laterality: Left;   BREAST SURGERY     CARDIAC CATHETERIZATION  07/13/2017   CATARACT EXTRACTION W/PHACO Right 01/28/2015   Procedure: CATARACT EXTRACTION PHACO AND INTRAOCULAR LENS PLACEMENT :  CDE:  5.70;  Surgeon: Rutherford Guys, MD;  [facility];  Service: Ophthalmology;  Laterality: Right;   CATARACT EXTRACTION W/PHACO Left 02/11/2015   Procedure: CATARACT EXTRACTION PHACO AND INTRAOCULAR LENS PLACEMENT (IOC);  Surgeon: Rutherford Guys, MD;  Location[facility];  Service: Ophthalmology;  Laterality: Left;  CDE: 7.38   COLONOSCOPY N/A 09/26/2012   Procedure: COLONOSCOPY;  Surgeon: Jamesetta So, MD;  [facility];  Service: Gastroenterology;  Laterality: N/A;   CORONARY ARTERY BYPASS GRAFT N/A 07/15/2017   Procedure: CORONARY ARTERY BYPASS GRAFTING (CABG) X 3 USING LEFT INTERNAL MAMMARY ARTERY AND RIGHT SAPHENOUS VEIN- ENDOSCOPICALLY HARVESTED;  Surgeon: Melrose Nakayama, MD;  Location: [facility];  Service: Open Heart Surgery;  Laterality: N/A;   IR IMAGING GUIDED PORT INSERTION  12/29/2018   IR REMOVAL TUN ACCESS W/ PORT W/O FL MOD SED  04/26/2019   LEFT HEART CATH AND CORONARY ANGIOGRAPHY N/A 07/13/2017   Procedure: LEFT HEART CATH AND CORONARY ANGIOGRAPHY;  Surgeon: Martinique, Peter M, MD;  Location: [facility];  Service: Cardiovascular;  Laterality: N/A;   LEFT HEART CATH AND  CORS/GRAFTS ANGIOGRAPHY N/A 11/03/2017   Procedure: LEFT HEART CATH AND CORS/GRAFTS ANGIOGRAPHY;  Surgeon: Troy Sine, MD;  Location: [facility];  Service: Cardiovascular;  Laterality: N/A;   TEE WITHOUT CARDIOVERSION N/A 07/15/2017   Procedure: TRANSESOPHAGEAL ECHOCARDIOGRAM (TEE);  Surgeon: Melrose Nakayama, MD;  Location: [facility];  Service: Open Heart Surgery;  Laterality: N/A;   TUBAL LIGATION     TYMPANOPLASTY Left    fluid from ear drum    There were no vitals filed for this visit.    Subjective Assessment - 12/30/20 0751     Subjective States she has been having neck and shoulder  pain. States that she started having pain over a year. States it started on the right side and she thought it was due to the vaccine and then it transitioned to her left arm. States that she recently got a cortisone shot in her left shoulder and now she has improved ability closing her hands where as prior she could not do that. States that pain is currently in left shoulder and radiates up into neck. Stats her right hand still bothers her. Current 0/10 pain level in left shoulder and states she hasn't had as much pain since the cortisone shot but pain can get up to 10/10. States that she will have a catch in her shoulder with reaching overhead and across her body.    Pertinent History CAD, HTN, osteopenia, obese, breast cancer history on left? in 2020?, hx of left breast cancer with lumpectomy and lymphnode removal    Limitations House hold activities    Patient Stated Goals to improve ability to bath herself, to get pain to stop without needing medication.    Currently in Pain? Yes    Pain Score 10-Worst pain ever   at worse   Pain Location Shoulder    Pain Orientation Left    Pain Descriptors / Indicators Throbbing   catching   Pain Type Chronic pain    Aggravating Factors  reaching overhead and across her body    Pain Relieving Factors cortisone shot, rest,                [facility] PT Assessment - 12/30/20 0001       Assessment   Medical Diagnosis Left shoulder arthritis and cervical DDD    Referring Provider (PT) Bo Merino MD    Next MD Visit none at this time    Prior Therapy no but had cardiac rehab      Precautions   Precautions None      Balance Screen   Has the patient fallen in the past 6 months No      Prior Function   Level of Independence Independent      Cognition   Overall Cognitive Status Within Functional Limits for tasks assessed      Observation/Other Assessments   Focus on Therapeutic Outcomes (FOTO)  58% function      Posture/Postural Control    Posture/Postural Control Postural limitations    Postural Limitations Forward head;Rounded Shoulders;Posterior pelvic tilt      ROM / Strength   AROM / PROM / Strength AROM;Strength      AROM   AROM Assessment Site Cervical;Shoulder    Right/Left Shoulder Right;Left    Right Shoulder Flexion 162 Degrees   no change in symptoms   Right Shoulder ABduction 120 Degrees   no change in symptoms   Right Shoulder Internal Rotation --   reaches toL 4 SP  level but mid trunk - pull in shoulder   Right Shoulder External Rotation --   reaches to C7 SP   Left Shoulder Flexion 118 Degrees   catch in arm (in axilla region) - painful   Left Shoulder ABduction 110 Degrees   catch in axxilla region   Left Shoulder Internal Rotation --   reaches to left posterior hip - pain in shoulder and neck   Left Shoulder External Rotation --   reaches to C7 SP   Cervical Flexion WNL    Cervical Extension 10   pulling on left side of neck   Cervical - Right Side Bend 10   pulling on left side   Cervical - Left Side Bend 22   pulling on left side   Cervical - Right Rotation 55   no change in symptoms   Cervical - Left Rotation 52   no change in symptoms     Strength   Strength Assessment Site Shoulder    Right/Left Shoulder Left;Right    Right Shoulder Flexion 4/5    Right Shoulder ABduction 4+/5    Right Shoulder Internal Rotation 4-/5    Right Shoulder External Rotation 3+/5    Left Shoulder Flexion 3+/5   pain in left shoulder   Left Shoulder ABduction 3+/5   catches/painful in arm   Left Shoulder Internal Rotation 3+/5   pain in shoulder and arm   Left Shoulder External Rotation 3+/5   pain in shoulder and arm     Palpation   Spinal mobility hypomobility noted throughout cervical spine and pain wiht medial glides bilaterally    Palpation comment Tenderness to palpation and increased resting tone noted in left UT and bilateral biceps      Special Tests    Special Tests Cervical    Cervical Tests  Dictraction      Distraction Test   Findngs --   pt guarding                       Objective measurements completed on examination: See above findings.       [facility] Adult PT Treatment/Exercise - 12/30/20 0001       Exercises   Exercises Shoulder      Shoulder Exercises: Supine   Flexion AAROM;Both;15 reps   with cane - in painfree ROM   Other Supine Exercises cervical ROT x10 5" holds biliaterally                    PT Education - 12/30/20 0826     Education Details in current POC, HEP, presentatio and anatomy    Person(s) Educated Patient    Methods Explanation    Comprehension Verbalized understanding              PT Short Term Goals - 12/30/20 0755       PT SHORT TERM GOAL #1   Title Patient will be able to demonstrate at least 135 degrees of left shoulder flexion to improve overhead reaching.    Time 4    Period Weeks    Status New    Target Date 01/27/21      PT SHORT TERM GOAL #2   Title Patient will report at least 50% improvement in overall symptoms and/or function to demonstrate improved functional mobility    Time 4    Period Weeks    Status New    Target Date 01/27/21      PT  SHORT TERM GOAL #3   Title Patient will be independent in self management strategies to improve quality of life and functional outcomes.    Time 4    Period Weeks    Status New    Target Date 01/27/21               PT Long Term Goals - 12/30/20 0825       PT LONG TERM GOAL #1   Title Patient will demonstrate at least 4/5 MMT in left shoulder    Time 8    Period Weeks    Status New    Target Date 02/24/21      PT LONG TERM GOAL #2   Title Patient will improve on FOTO score to meet predicted outcomes to demonstrate improved functional mobility.    Time 8    Period Weeks    Status New    Target Date 02/24/21      PT LONG TERM GOAL #3   Title Patient will report at least 75% improvement in overall symptoms and/or function to  demonstrate improved functional mobility    Time 8    Period Weeks    Status New    Target Date 02/24/21                    Plan - 12/30/20 0820     Clinical Impression Statement Patient is a 74 y.o. X who presents to physical therapy with complaint of neck and left shoulder pain. Patient demonstrates decreased strength, ROM restriction, reduced flexibility, increased tenderness to palpation and postural abnormalities which are likely contributing to symptoms of pain and are negatively impacting patient ability to perform ADLs. Patient will benefit from skilled physical therapy services to address these deficits to reduce pain and improve level of function with ADLs    Personal Factors and Comorbidities Comorbidity 1;Comorbidity 2    Comorbidities CAD, HTN, osteopenia, obese,, history of left lumpectomy and luymph node removal secondary to breast cancer    Examination-Activity Limitations Reach Overhead;Lift;Carry;Dressing    Examination-Participation Restrictions Meal Prep;Laundry;Cleaning    Stability/Clinical Decision Making Stable/Uncomplicated    Clinical Decision Making Low    Rehab Potential Good    PT Frequency 1x / week    PT Duration 8 weeks    PT Treatment/Interventions ADLs/Self Care Home Management;Cryotherapy;Electrical Stimulation;Moist Heat;Traction;Therapeutic exercise;Therapeutic activities;Neuromuscular re-education;Patient/family education;Passive range of motion;Dry needling;Joint Manipulations    PT Next Visit Plan shoulder/neck ROM, STM, shoulder mobilizations, isometrics and then progress strength as tolerated, posture    PT Home Exercise Plan AAROM shouldre flexion cane, cervical ROT supine    Consulted and Agree with Plan of Care Patient             Patient will benefit from skilled therapeutic intervention in order to improve the following deficits and impairments:  Pain, Decreased range of motion, Impaired UE functional use, Postural  dysfunction  Visit Diagnosis: Cervicalgia  Acute pain of left shoulder  Muscle weakness (generalized)  Decreased range of motion of left shoulder     Problem List Patient Active Problem List   Diagnosis Date Noted   Port-A-Cath in place 01/16/2019   Genetic testing 08/29/2018   Family history of breast cancer    Family history of colon cancer    Malignant neoplasm of lower-outer quadrant of left breast of female, estrogen receptor negative ([place]) 08/16/2018   NSTEMI (non-ST elevated myocardial infarction) ([place]) 11/02/2017   S/P CABG x 3 07/15/2017   Coronary artery  disease 07/15/2017   Unstable angina ([place]) 07/13/2017   Essential hypertension 07/13/2017   Obesity 07/13/2017   Hyperglycemia 07/13/2017   Hypothyroid 01/06/2012   Meniere disease    Arthritis    8:27 AM, 12/30/20 Jerene Pitch, DPT Physical Therapy with [facility]  [phone] office   [facility] [address] [place], [place], [address] Phone: [phone]   Fax:  [phone]  Name: Gloria Mccoy MRN: [id number] Date of Birth: Dec 07, 1945

## 2020-12-31 ENCOUNTER — Encounter (HOSPITAL_COMMUNITY): Payer: Medicare HMO | Admitting: Physical Therapy

## 2021-01-07 ENCOUNTER — Other Ambulatory Visit: Payer: Self-pay

## 2021-01-07 ENCOUNTER — Ambulatory Visit (HOSPITAL_COMMUNITY): Payer: Medicare HMO | Admitting: Physical Therapy

## 2021-01-07 DIAGNOSIS — M6281 Muscle weakness (generalized): Secondary | ICD-10-CM | POA: Diagnosis not present

## 2021-01-07 DIAGNOSIS — M542 Cervicalgia: Secondary | ICD-10-CM | POA: Diagnosis not present

## 2021-01-07 DIAGNOSIS — M25512 Pain in left shoulder: Secondary | ICD-10-CM

## 2021-01-07 DIAGNOSIS — M25612 Stiffness of left shoulder, not elsewhere classified: Secondary | ICD-10-CM | POA: Diagnosis not present

## 2021-01-07 NOTE — Therapy (Signed)
[facility] [place], [place], [address] Phone: [phone]   Fax:  [phone]  Physical Therapy Treatment  Patient Details  Name: Gloria Mccoy MRN: [id number] Date of Birth: [birth year] Referring Provider (PT): Bo Merino MD   Encounter Date: 01/07/2021   PT End of Session - 01/07/21 0857     Visit Number 2    Number of Visits 8    Date for PT Re-Evaluation 02/24/21    Authorization Type humana medicare - auth required, no VL    Progress Note Due on Visit 10    PT Start Time [phone]    PT Stop Time 0913    PT Time Calculation (min) 40 min    Activity Tolerance Patient tolerated treatment well             Past Medical History:  Diagnosis Date   Arthritis    "hands sometimes" (07/13/2017)   Coronary artery disease    a. s/p CABG x3 in 06/2017 with LIMA-LAD, SVG-D1, and SVG-RI.  b. 10/2017: cath showing a widely patent LIMA-LAD with ostial occlusion of the SVG-RI and subtotally occluded atretic SVG-D1. Graft occlusion thought to be 2ry to improvement in pre-CABG stenoses.    Essential hypertension    Family history of adverse reaction to anesthesia    sister had a complication that was stated she had a "foggy" episode after her breast surgery was readmitted 1 day post op after being discharged from the hospital. Sister also has significant lung problems that could have contributed to this    Family history of breast cancer    Family history of colon cancer    Hypothyroid    Meniere disease    Myocardial infarction ([place]) 06/2017   during cardaic rehab   Obesity    Osteopenia 01/2012   T score -1.3 FRAX 7.9%/0.6%    Past Surgical History:  Procedure Laterality Date   BREAST LUMPECTOMY WITH RADIOACTIVE SEED AND SENTINEL LYMPH NODE BIOPSY Left 09/01/2018   Procedure: LEFT BREAST LUMPECTOMY WITH RADIOACTIVE SEED AND SENTINEL LYMPH NODE BIOPSY;  Surgeon: Jovita Kussmaul, MD;  Location: [facility];  Service: General;   Laterality: Left;   BREAST SURGERY     CARDIAC CATHETERIZATION  07/13/2017   CATARACT EXTRACTION W/PHACO Right 01/28/2015   Procedure: CATARACT EXTRACTION PHACO AND INTRAOCULAR LENS PLACEMENT :  CDE:  5.70;  Surgeon: Rutherford Guys, MD;  [facility];  Service: Ophthalmology;  Laterality: Right;   CATARACT EXTRACTION W/PHACO Left 02/11/2015   Procedure: CATARACT EXTRACTION PHACO AND INTRAOCULAR LENS PLACEMENT (IOC);  Surgeon: Rutherford Guys, MD;  [facility];  Service: Ophthalmology;  Laterality: Left;  CDE: 7.38   COLONOSCOPY N/A 09/26/2012   Procedure: COLONOSCOPY;  Surgeon: Jamesetta So, MD;  [facility];  Service: Gastroenterology;  Laterality: N/A;   CORONARY ARTERY BYPASS GRAFT N/A 07/15/2017   Procedure: CORONARY ARTERY BYPASS GRAFTING (CABG) X 3 USING LEFT INTERNAL MAMMARY ARTERY AND RIGHT SAPHENOUS VEIN- ENDOSCOPICALLY HARVESTED;  Surgeon: Melrose Nakayama, MD;  Location: [facility];  Service: Open Heart Surgery;  Laterality: N/A;   IR IMAGING GUIDED PORT INSERTION  12/29/2018   IR REMOVAL TUN ACCESS W/ PORT W/O FL MOD SED  04/26/2019   LEFT HEART CATH AND CORONARY ANGIOGRAPHY N/A 07/13/2017   Procedure: LEFT HEART CATH AND CORONARY ANGIOGRAPHY;  Surgeon: Martinique, Peter M, MD;  Location: [facility];  Service: Cardiovascular;  Laterality: N/A;   LEFT HEART CATH AND  CORS/GRAFTS ANGIOGRAPHY N/A 11/03/2017   Procedure: LEFT HEART CATH AND CORS/GRAFTS ANGIOGRAPHY;  Surgeon: Troy Sine, MD;  Location: [facility];  Service: Cardiovascular;  Laterality: N/A;   TEE WITHOUT CARDIOVERSION N/A 07/15/2017   Procedure: TRANSESOPHAGEAL ECHOCARDIOGRAM (TEE);  Surgeon: Melrose Nakayama, MD;  Location: [facility];  Service: Open Heart Surgery;  Laterality: N/A;   TUBAL LIGATION     TYMPANOPLASTY Left    fluid from ear drum    There were no vitals filed for this visit.   Subjective Assessment - 01/07/21 0840     Subjective pt states her pain is 6/10 today in central  cervical and down into Lt shoulder.  "pain draws in my neck"    Currently in Pain? Yes    Pain Score 6     Pain Location Neck    Pain Orientation Left    Pain Descriptors / Indicators Aching;Throbbing    Pain Type Chronic pain                               [facility] Adult PT Treatment/Exercise - 01/07/21 0001       Shoulder Exercises: Supine   Flexion AAROM;Both;15 reps    Other Supine Exercises cervical ROT x10 5" holds biliaterally      Shoulder Exercises: Seated   Retraction 10 reps    Other Seated Exercises cervical excursions 5X each    Other Seated Exercises cervical retractions 10X in sitting      Manual Therapy   Manual Therapy Soft tissue mobilization    Manual therapy comments completed seperately from all other skilled interventions    Soft tissue mobilization Lt UT and cervical in sitting                    PT Education - 01/07/21 0902     Education Details reviewed goals, HEP and POC moving forward    Person(s) Educated Patient    Methods Explanation    Comprehension Verbalized understanding              PT Short Term Goals - 01/07/21 0859       PT SHORT TERM GOAL #1   Title Patient will be able to demonstrate at least 135 degrees of left shoulder flexion to improve overhead reaching.    Baseline 7/20 has 150 degrees flexion in sitting    Time 4    Period Weeks    Status Achieved    Target Date 01/27/21      PT SHORT TERM GOAL #2   Title Patient will report at least 50% improvement in overall symptoms and/or function to demonstrate improved functional mobility    Time 4    Period Weeks    Status On-going    Target Date 01/27/21      PT SHORT TERM GOAL #3   Title Patient will be independent in self management strategies to improve quality of life and functional outcomes.    Time 4    Period Weeks    Status On-going    Target Date 01/27/21               PT Long Term Goals - 01/07/21 0901       PT LONG  TERM GOAL #1   Title Patient will demonstrate at least 4/5 MMT in left shoulder    Time 8    Period Weeks    Status  On-going      PT LONG TERM GOAL #2   Title Patient will improve on FOTO score to meet predicted outcomes to demonstrate improved functional mobility.    Time 8    Period Weeks    Status On-going      PT LONG TERM GOAL #3   Title Patient will report at least 75% improvement in overall symptoms and/or function to demonstrate improved functional mobility    Time 8    Period Weeks    Status On-going                   Plan - 01/07/21 2248     Clinical Impression Statement Reviewed goals and POC moving forward.   Pt has met STG #1 as her L shoulder flexion today in sitting is 150 degrees.  Pt reports the shot she received several weeks ago has improved her symptoms overall but still with pain in neck going down into Lt UE.  Progressed with postural exercises as well as cervical ROM.  Pt able to complete all with cues for form and completing activities slowly and controlled.  Ended session with manual with noted tightness in Lt upper trap.  Tightness was resolved quickly with manual and pt reported overall improvement in pain and motion.    Personal Factors and Comorbidities Comorbidity 1;Comorbidity 2    Comorbidities CAD, HTN, osteopenia, obese,, history of left lumpectomy and luymph node removal secondary to breast cancer    Examination-Activity Limitations Reach Overhead;Lift;Carry;Dressing    Examination-Participation Restrictions Meal Prep;Laundry;Cleaning    Stability/Clinical Decision Making Stable/Uncomplicated    Rehab Potential Good    PT Frequency 1x / week    PT Duration 8 weeks    PT Treatment/Interventions ADLs/Self Care Home Management;Cryotherapy;Electrical Stimulation;Moist Heat;Traction;Therapeutic exercise;Therapeutic activities;Neuromuscular re-education;Patient/family education;Passive range of motion;Dry needling;Joint Manipulations    PT Next  Visit Plan shoulder/neck ROM, STM, shoulder mobilizations, isometrics and then progress strength as tolerated.  Progress postural strenght with manual as needed.  Update HEP with seated exercises    PT Home Exercise Plan AAROM shouldre flexion cane, cervical ROT supine    Consulted and Agree with Plan of Care Patient             Patient will benefit from skilled therapeutic intervention in order to improve the following deficits and impairments:  Pain, Decreased range of motion, Impaired UE functional use, Postural dysfunction  Visit Diagnosis: Cervicalgia  Acute pain of left shoulder  Muscle weakness (generalized)     Problem List Patient Active Problem List   Diagnosis Date Noted   Port-A-Cath in place 01/16/2019   Genetic testing 08/29/2018   Family history of breast cancer    Family history of colon cancer    Malignant neoplasm of lower-outer quadrant of left breast of female, estrogen receptor negative ([place]) 08/16/2018   NSTEMI (non-ST elevated myocardial infarction) ([place]) 11/02/2017   S/P CABG x 3 07/15/2017   Coronary artery disease 07/15/2017   Unstable angina ([place]) 07/13/2017   Essential hypertension 07/13/2017   Obesity 07/13/2017   Hyperglycemia 07/13/2017   Hypothyroid 01/06/2012   Meniere disease    Arthritis    Teena Irani, PTA/CLT [phone]  Teena Irani 01/07/2021, 9:21 AM  [facility] [address] [place], [place], [address] Phone: [phone]   Fax:  [phone]  Name: LILLY GASSER MRN: [id number] Date of Birth: 03/22/1946

## 2021-01-13 ENCOUNTER — Ambulatory Visit (HOSPITAL_COMMUNITY): Payer: Medicare HMO

## 2021-01-13 ENCOUNTER — Other Ambulatory Visit: Payer: Self-pay

## 2021-01-13 ENCOUNTER — Encounter (HOSPITAL_COMMUNITY): Payer: Self-pay

## 2021-01-13 DIAGNOSIS — M25512 Pain in left shoulder: Secondary | ICD-10-CM | POA: Diagnosis not present

## 2021-01-13 DIAGNOSIS — M25612 Stiffness of left shoulder, not elsewhere classified: Secondary | ICD-10-CM

## 2021-01-13 DIAGNOSIS — M542 Cervicalgia: Secondary | ICD-10-CM | POA: Diagnosis not present

## 2021-01-13 DIAGNOSIS — M6281 Muscle weakness (generalized): Secondary | ICD-10-CM | POA: Diagnosis not present

## 2021-01-13 NOTE — Patient Instructions (Signed)
Access Code: [email or web address] URL: [email or web address] Date: 01/13/2021 Prepared by: Sherlyn Lees  Exercises Supine Shoulder Abduction AAROM with Dowel - 1 x daily - 7 x weekly - 3 sets - 10 reps Supine Shoulder External Rotation with Dowel - 1 x daily - 7 x weekly - 3 sets - 10 reps Supine Chin Tuck - 1 x daily - 7 x weekly - 2 sets - 10 reps - 2 sec hold Supine Deep Neck Flexor Training - Repetitions - 1 x daily - 7 x weekly - 3 sets - 10 reps - 2 sec hold Standing shoulder flexion wall slides - 1 x daily - 7 x weekly - 3 sets - 10 reps

## 2021-01-13 NOTE — Therapy (Signed)
[place], [place], [address] Phone: [phone]   Fax:  [phone]  Physical Therapy Treatment  Patient Details  Name: Gloria Mccoy MRN: [id number] Date of Birth: [birth year] Referring Provider (PT): Bo Merino MD   Encounter Date: 01/13/2021   PT End of Session - 01/13/21 0820     Visit Number 3    Number of Visits 8    Date for PT Re-Evaluation 02/24/21    Authorization Type human[employer] medicare - auth required, no VL    Progress Note Due on Visit 10    PT Start Time 0815    PT Stop Time 0900    PT Time Calculation (min) 45 min    Activity Tolerance Patient tolerated treatment well             Past Medical History:  Diagnosis Date   Arthritis    "hands sometimes" (07/13/2017)   Coronary artery disease    a. s/p CABG x3 in 06/2017 with LIMA-LAD, SVG-D1, and SVG-RI.  b. 10/2017: cath showing a widely patent LIMA-LAD with ostial occlusion of the SVG-RI and subtotally occluded atretic SVG-D1. Graft occlusion thought to be 2ry to improvement in pre-CABG stenoses.    Essential hypertension    Family history of adverse reaction to anesthesia    sister had a complication that was stated she had a "foggy" episode after her breast surgery was readmitted 1 day post op after being discharged from the hospital. Sister also has significant lung problems that could have contributed to this    Family history of breast cancer    Family history of colon cancer    Hypothyroid    Meniere disease    Myocardial infarction ([place]) 06/2017   during cardaic rehab   Obesity    Osteopenia 01/2012   T score -1.3 FRAX 7.9%/0.6%    Past Surgical History:  Procedure Laterality Date   BREAST LUMPECTOMY WITH RADIOACTIVE SEED AND SENTINEL LYMPH NODE BIOPSY Left 09/01/2018   Procedure: LEFT BREAST LUMPECTOMY WITH RADIOACTIVE SEED AND SENTINEL LYMPH NODE BIOPSY;  Surgeon: Jovita Kussmaul, MD;  Location: [facility];  Service: General;   Laterality: Left;   BREAST SURGERY     CARDIAC CATHETERIZATION  07/13/2017   CATARACT EXTRACTION W/PHACO Right 01/28/2015   Procedure: CATARACT EXTRACTION PHACO AND INTRAOCULAR LENS PLACEMENT :  CDE:  5.70;  Surgeon: Rutherford Guys, MD;  [facility];  Service: Ophthalmology;  Laterality: Right;   CATARACT EXTRACTION W/PHACO Left 02/11/2015   Procedure: CATARACT EXTRACTION PHACO AND INTRAOCULAR LENS PLACEMENT (IOC);  Surgeon: Rutherford Guys, MD;  Location: [facility];  Service: Ophthalmology;  Laterality: Left;  CDE: 7.38   COLONOSCOPY N/A 09/26/2012   Procedure: COLONOSCOPY;  Surgeon: Jamesetta So, MD;  [facility];  Service: Gastroenterology;  Laterality: N/A;   CORONARY ARTERY BYPASS GRAFT N/A 07/15/2017   Procedure: CORONARY ARTERY BYPASS GRAFTING (CABG) X 3 USING LEFT INTERNAL MAMMARY ARTERY AND RIGHT SAPHENOUS VEIN- ENDOSCOPICALLY HARVESTED;  Surgeon: Melrose Nakayama, MD;  Location: [facility];  Service: Open Heart Surgery;  Laterality: N/A;   IR IMAGING GUIDED PORT INSERTION  12/29/2018   IR REMOVAL TUN ACCESS W/ PORT W/O FL MOD SED  04/26/2019   LEFT HEART CATH AND CORONARY ANGIOGRAPHY N/A 07/13/2017   Procedure: LEFT HEART CATH AND CORONARY ANGIOGRAPHY;  Surgeon: Martinique, Peter M, MD;  Location: [facility];  Service: Cardiovascular;  Laterality: N/A;   LEFT HEART CATH AND  CORS/GRAFTS ANGIOGRAPHY N/A 11/03/2017   Procedure: LEFT HEART CATH AND CORS/GRAFTS ANGIOGRAPHY;  Surgeon: Troy Sine, MD;  Location: [facility];  Service: Cardiovascular;  Laterality: N/A;   TEE WITHOUT CARDIOVERSION N/A 07/15/2017   Procedure: TRANSESOPHAGEAL ECHOCARDIOGRAM (TEE);  Surgeon: Melrose Nakayama, MD;  Location: [facility];  Service: Open Heart Surgery;  Laterality: N/A;   TUBAL LIGATION     TYMPANOPLASTY Left    fluid from ear drum    There were no vitals filed for this visit.   Subjective Assessment - 01/13/21 0821     Subjective Pt notes some improvement in her neck pain,  reports less "drawing pain" and improved left shoulder pain but still limited ROM    Pertinent History CAD, HTN, osteopenia, obese, breast cancer history on left? in 2020?, hx of left breast cancer with lumpectomy and lymphnode removal    Currently in Pain? Yes    Pain Score 4     Pain Location Neck    Pain Orientation Left    Pain Descriptors / Indicators Aching    Pain Type Chronic pain                [facility] PT Assessment - 01/13/21 0001       Assessment   Medical Diagnosis Left shoulder arthritis and cervical DDD    Referring Provider (PT) Bo Merino MD                           [facility] Adult PT Treatment/Exercise - 01/13/21 0001       Exercises   Exercises Neck      Neck Exercises: Supine   Neck Retraction 15 reps;3 secs    Capital Flexion 20 reps    Capital Flexion Limitations 2 sec hold      Shoulder Exercises: Supine   External Rotation AAROM;Left;20 reps    External Rotation Limitations w/ PVC pipe    Flexion AAROM;Both;20 reps    ABduction AAROM;Left;20 reps      Shoulder Exercises: Stretch   Wall Stretch - Flexion Other (comment)   2x10 2 sec hold                   PT Education - 01/13/21 0854     Education Details education in HEP additions, tx rationale, explanation of reps/sets    Person(s) Educated Patient    Methods Explanation;Handout    Comprehension Verbalized understanding              PT Short Term Goals - 01/07/21 0859       PT SHORT TERM GOAL #1   Title Patient will be able to demonstrate at least 135 degrees of left shoulder flexion to improve overhead reaching.    Baseline 7/20 has 150 degrees flexion in sitting    Time 4    Period Weeks    Status Achieved    Target Date 01/27/21      PT SHORT TERM GOAL #2   Title Patient will report at least 50% improvement in overall symptoms and/or function to demonstrate improved functional mobility    Time 4    Period Weeks    Status On-going     Target Date 01/27/21      PT SHORT TERM GOAL #3   Title Patient will be independent in self management strategies to improve quality of life and functional outcomes.    Time 4    Period Weeks  Status On-going    Target Date 01/27/21               PT Long Term Goals - 01/07/21 0901       PT LONG TERM GOAL #1   Title Patient will demonstrate at least 4/5 MMT in left shoulder    Time 8    Period Weeks    Status On-going      PT LONG TERM GOAL #2   Title Patient will improve on FOTO score to meet predicted outcomes to demonstrate improved functional mobility.    Time 8    Period Weeks    Status On-going      PT LONG TERM GOAL #3   Title Patient will report at least 75% improvement in overall symptoms and/or function to demonstrate improved functional mobility    Time 8    Period Weeks    Status On-going                   Plan - 01/13/21 [id number]     Clinical Impression Statement Tolerated tx session well and demonstrating capsular pattern with left shoulder ROM limitations. Good performance of cervical ROM/strengthening exercises without adverse effects and good self-monitoring appreciated. Continued sessions indicated to improve left shoulder ROM, improve postural alignment, and initiate strengthening for scapular muscles and decrease forward head posture    Personal Factors and Comorbidities Comorbidity 1;Comorbidity 2    Comorbidities CAD, HTN, osteopenia, obese,, history of left lumpectomy and luymph node removal secondary to breast cancer    Examination-Activity Limitations Reach Overhead;Lift;Carry;Dressing    Examination-Participation Restrictions Meal Prep;Laundry;Cleaning    Stability/Clinical Decision Making Stable/Uncomplicated    Rehab Potential Good    PT Frequency 1x / week    PT Duration 8 weeks    PT Treatment/Interventions ADLs/Self Care Home Management;Cryotherapy;Electrical Stimulation;Moist Heat;Traction;Therapeutic exercise;Therapeutic  activities;Neuromuscular re-education;Patient/family education;Passive range of motion;Dry needling;Joint Manipulations    PT Next Visit Plan shoulder/neck ROM, STM, shoulder mobilizations, isometrics and then progress strength as tolerated.  Progress postural strenght with manual as needed.  Update HEP with seated exercises    PT Home Exercise Plan AAROM shouldre flexion cane, cervical ROT supine. Supine shoulder AAROM ER, abduction, supine neck retraction, supine neck flexion, wall wash    Consulted and Agree with Plan of Care Patient             Patient will benefit from skilled therapeutic intervention in order to improve the following deficits and impairments:  Pain, Decreased range of motion, Impaired UE functional use, Postural dysfunction  Visit Diagnosis: Cervicalgia  Acute pain of left shoulder  Muscle weakness (generalized)  Decreased range of motion of left shoulder     Problem List Patient Active Problem List   Diagnosis Date Noted   Port-A-Cath in place 01/16/2019   Genetic testing 08/29/2018   Family history of breast cancer    Family history of colon cancer    Malignant neoplasm of lower-outer quadrant of left breast of female, estrogen receptor negative ([place]) 08/16/2018   NSTEMI (non-ST elevated myocardial infarction) ([place]) 11/02/2017   S/P CABG x 3 07/15/2017   Coronary artery disease 07/15/2017   Unstable angina ([place]) 07/13/2017   Essential hypertension 07/13/2017   Obesity 07/13/2017   Hyperglycemia 07/13/2017   Hypothyroid 01/06/2012   Meniere disease    Arthritis    8:55 AM, 01/13/21 M. Sherlyn Lees, PT, DPT Physical Therapist- [place] Office Number: [place]  [place], [place], [address] Phone: [phone]   Fax:  [phone]  Name: Gloria Mccoy MRN: [id number] Date of Birth: 09-Feb-1946

## 2021-01-19 ENCOUNTER — Ambulatory Visit (HOSPITAL_COMMUNITY): Payer: Medicare HMO | Attending: Rheumatology

## 2021-01-19 ENCOUNTER — Encounter (HOSPITAL_COMMUNITY): Payer: Self-pay

## 2021-01-19 ENCOUNTER — Other Ambulatory Visit: Payer: Self-pay

## 2021-01-19 DIAGNOSIS — M25612 Stiffness of left shoulder, not elsewhere classified: Secondary | ICD-10-CM | POA: Insufficient documentation

## 2021-01-19 DIAGNOSIS — M6281 Muscle weakness (generalized): Secondary | ICD-10-CM | POA: Diagnosis not present

## 2021-01-19 DIAGNOSIS — M25512 Pain in left shoulder: Secondary | ICD-10-CM | POA: Insufficient documentation

## 2021-01-19 DIAGNOSIS — M542 Cervicalgia: Secondary | ICD-10-CM | POA: Diagnosis not present

## 2021-01-19 NOTE — Therapy (Signed)
[place], [place], [address] Phone: [phone]   Fax:  [phone]  Physical Therapy Treatment  Patient Details  Name: Gloria Mccoy MRN: [id number] Date of Birth: 02/11/1946 Referring Provider (PT): Bo Merino MD   Encounter Date: 01/19/2021   PT End of Session - 01/19/21 0758     Visit Number 4    Number of Visits 8    Date for PT Re-Evaluation 02/24/21    Authorization Type human[employer] medicare - auth required, no VL    Progress Note Due on Visit 10    PT Start Time 0755    PT Stop Time 0840    PT Time Calculation (min) 45 min    Activity Tolerance Patient tolerated treatment well             Past Medical History:  Diagnosis Date   Arthritis    "hands sometimes" (07/13/2017)   Coronary artery disease    a. s/p CABG x3 in 06/2017 with LIMA-LAD, SVG-D1, and SVG-RI.  b. 10/2017: cath showing a widely patent LIMA-LAD with ostial occlusion of the SVG-RI and subtotally occluded atretic SVG-D1. Graft occlusion thought to be 2ry to improvement in pre-CABG stenoses.    Essential hypertension    Family history of adverse reaction to anesthesia    sister had a complication that was stated she had a "foggy" episode after her breast surgery was readmitted 1 day post op after being discharged from the hospital. Sister also has significant lung problems that could have contributed to this    Family history of breast cancer    Family history of colon cancer    Hypothyroid    Meniere disease    Myocardial infarction ([place]) 06/2017   during cardaic rehab   Obesity    Osteopenia 01/2012   T score -1.3 FRAX 7.9%/0.6%    Past Surgical History:  Procedure Laterality Date   BREAST LUMPECTOMY WITH RADIOACTIVE SEED AND SENTINEL LYMPH NODE BIOPSY Left 09/01/2018   Procedure: LEFT BREAST LUMPECTOMY WITH RADIOACTIVE SEED AND SENTINEL LYMPH NODE BIOPSY;  Surgeon: Jovita Kussmaul, MD;  Location: [facility];  Service: General;   Laterality: Left;   BREAST SURGERY     CARDIAC CATHETERIZATION  07/13/2017   CATARACT EXTRACTION W/PHACO Right 01/28/2015   Procedure: CATARACT EXTRACTION PHACO AND INTRAOCULAR LENS PLACEMENT :  CDE:  5.70;  Surgeon: Rutherford Guys, MD;  [facility];  Service: Ophthalmology;  Laterality: Right;   CATARACT EXTRACTION W/PHACO Left 02/11/2015   Procedure: CATARACT EXTRACTION PHACO AND INTRAOCULAR LENS PLACEMENT (IOC);  Surgeon: Rutherford Guys, MD;  [facility];  Service: Ophthalmology;  Laterality: Left;  CDE: 7.38   COLONOSCOPY N/A 09/26/2012   Procedure: COLONOSCOPY;  Surgeon: Jamesetta So, MD;  [facility];  Service: Gastroenterology;  Laterality: N/A;   CORONARY ARTERY BYPASS GRAFT N/A 07/15/2017   Procedure: CORONARY ARTERY BYPASS GRAFTING (CABG) X 3 USING LEFT INTERNAL MAMMARY ARTERY AND RIGHT SAPHENOUS VEIN- ENDOSCOPICALLY HARVESTED;  Surgeon: Melrose Nakayama, MD;  Location: [facility];  Service: Open Heart Surgery;  Laterality: N/A;   IR IMAGING GUIDED PORT INSERTION  12/29/2018   IR REMOVAL TUN ACCESS W/ PORT W/O FL MOD SED  04/26/2019   LEFT HEART CATH AND CORONARY ANGIOGRAPHY N/A 07/13/2017   Procedure: LEFT HEART CATH AND CORONARY ANGIOGRAPHY;  Surgeon: Martinique, Peter M, MD;  Location: [facility];  Service: Cardiovascular;  Laterality: N/A;   LEFT HEART CATH AND  CORS/GRAFTS ANGIOGRAPHY N/A 11/03/2017   Procedure: LEFT HEART CATH AND CORS/GRAFTS ANGIOGRAPHY;  Surgeon: Troy Sine, MD;  Location: [facility];  Service: Cardiovascular;  Laterality: N/A;   TEE WITHOUT CARDIOVERSION N/A 07/15/2017   Procedure: TRANSESOPHAGEAL ECHOCARDIOGRAM (TEE);  Surgeon: Melrose Nakayama, MD;  Location: [facility];  Service: Open Heart Surgery;  Laterality: N/A;   TUBAL LIGATION     TYMPANOPLASTY Left    fluid from ear drum    There were no vitals filed for this visit.   Subjective Assessment - 01/19/21 0757     Subjective Reports that she is having an easier time  lifting her left arm. Neck feels less stiff as well    Pertinent History CAD, HTN, osteopenia, obese, breast cancer history on left? in 2020?, hx of left breast cancer with lumpectomy and lymphnode removal    Currently in Pain? Yes    Pain Score 3     Pain Location Neck    Pain Orientation Left    Pain Descriptors / Indicators Sore    Pain Type Chronic pain                [facility] PT Assessment - 01/19/21 0001       Assessment   Medical Diagnosis Left shoulder arthritis and cervical DDD    Referring Provider (PT) Bo Merino MD      AROM   Left Shoulder Flexion 140 Degrees    Left Shoulder ABduction 125 Degrees    Left Shoulder Internal Rotation --   left PSIS   Left Shoulder External Rotation 70 Degrees                           [facility] Adult PT Treatment/Exercise - 01/19/21 0001       Neck Exercises: Supine   Neck Retraction 20 reps;3 secs    Capital Flexion 20 reps;3 secs      Shoulder Exercises: Supine   External Rotation AAROM;Left;20 reps    External Rotation Limitations w/ PVC pipe    Flexion AAROM;Both;20 reps    ABduction AAROM;Left;20 reps      Shoulder Exercises: Sidelying   External Rotation Strengthening;Left;20 reps    External Rotation Weight (lbs) 2      Shoulder Exercises: Standing   Extension Strengthening;Left;20 reps;Weights    Extension Weight (lbs) 2      Shoulder Exercises: ROM/Strengthening   Other ROM/Strengthening Exercises towel stretch for left shoulder extension/int. rotation      Shoulder Exercises: Stretch   Wall Stretch - Flexion Other (comment)    Wall Stretch - Flexion Limitations 2x10 3 sec hold      Manual Therapy   Manual Therapy Joint mobilization    Manual therapy comments completed seperately from all other skilled interventions    Joint Mobilization grade 2-3 inferior glides left shoulder for abduction, mobilization for abduction and external rotation to improve left shoulder ROM                     PT Education - 01/19/21 0838     Education Details HEp additions    Person(s) Educated Patient    Methods Explanation;Handout    Comprehension Verbalized understanding              PT Short Term Goals - 01/07/21 0859       PT SHORT TERM GOAL #1   Title Patient will be able to demonstrate at least  135 degrees of left shoulder flexion to improve overhead reaching.    Baseline 7/20 has 150 degrees flexion in sitting    Time 4    Period Weeks    Status Achieved    Target Date 01/27/21      PT SHORT TERM GOAL #2   Title Patient will report at least 50% improvement in overall symptoms and/or function to demonstrate improved functional mobility    Time 4    Period Weeks    Status On-going    Target Date 01/27/21      PT SHORT TERM GOAL #3   Title Patient will be independent in self management strategies to improve quality of life and functional outcomes.    Time 4    Period Weeks    Status On-going    Target Date 01/27/21               PT Long Term Goals - 01/07/21 0901       PT LONG TERM GOAL #1   Title Patient will demonstrate at least 4/5 MMT in left shoulder    Time 8    Period Weeks    Status On-going      PT LONG TERM GOAL #2   Title Patient will improve on FOTO score to meet predicted outcomes to demonstrate improved functional mobility.    Time 8    Period Weeks    Status On-going      PT LONG TERM GOAL #3   Title Patient will report at least 75% improvement in overall symptoms and/or function to demonstrate improved functional mobility    Time 8    Period Weeks    Status On-going                   Plan - 01/19/21 [id number]     Clinical Impression Statement Demonstrating improved left shoulder ROM per measurements today and tolerating progressed activities for left shoulder strengthening without issue. Demonstrates left shoulder capsular pattern with ROM restrictions and limited internal rotation reaching to left PSIS.  Continued sessions indicated to improve c-spine ROM and posture and progress left shoulder ROM/strength to enhance function    Personal Factors and Comorbidities Comorbidity 1;Comorbidity 2    Comorbidities CAD, HTN, osteopenia, obese,, history of left lumpectomy and luymph node removal secondary to breast cancer    Examination-Activity Limitations Reach Overhead;Lift;Carry;Dressing    Examination-Participation Restrictions Meal Prep;Laundry;Cleaning    Stability/Clinical Decision Making Stable/Uncomplicated    Rehab Potential Good    PT Frequency 1x / week    PT Duration 8 weeks    PT Treatment/Interventions ADLs/Self Care Home Management;Cryotherapy;Electrical Stimulation;Moist Heat;Traction;Therapeutic exercise;Therapeutic activities;Neuromuscular re-education;Patient/family education;Passive range of motion;Dry needling;Joint Manipulations    PT Next Visit Plan shoulder/neck ROM, STM, shoulder mobilizations, isometrics and then progress strength as tolerated.  Progress postural strenght with manual as needed.  Update HEP with seated exercises    PT Home Exercise Plan AAROM shouldre flexion cane, cervical ROT supine. Supine shoulder AAROM ER, abduction, supine neck retraction, supine neck flexion, wall wash    Consulted and Agree with Plan of Care Patient             Patient will benefit from skilled therapeutic intervention in order to improve the following deficits and impairments:  Pain, Decreased range of motion, Impaired UE functional use, Postural dysfunction  Visit Diagnosis: Cervicalgia  Acute pain of left shoulder  Muscle weakness (generalized)  Decreased range of motion of left shoulder  Problem List Patient Active Problem List   Diagnosis Date Noted   Port-A-Cath in place 01/16/2019   Genetic testing 08/29/2018   Family history of breast cancer    Family history of colon cancer    Malignant neoplasm of lower-outer quadrant of left breast of female, estrogen  receptor negative ([place]) 08/16/2018   NSTEMI (non-ST elevated myocardial infarction) ([place]) 11/02/2017   S/P CABG x 3 07/15/2017   Coronary artery disease 07/15/2017   Unstable angina ([place]) 07/13/2017   Essential hypertension 07/13/2017   Obesity 07/13/2017   Hyperglycemia 07/13/2017   Hypothyroid 01/06/2012   Meniere disease    Arthritis    8:42 AM, 01/19/21 M. Sherlyn Lees, PT, DPT Physical Therapist- [place] Office Number: [phone]   [facility] [address] [place], [place], [address] Phone: [phone]   Fax:  [phone]  Name: Gloria Mccoy MRN: [id number] Date of Birth: 22-Jun-1945

## 2021-01-27 ENCOUNTER — Ambulatory Visit (HOSPITAL_COMMUNITY): Payer: Medicare HMO | Admitting: Physical Therapy

## 2021-01-27 ENCOUNTER — Other Ambulatory Visit: Payer: Self-pay

## 2021-01-27 DIAGNOSIS — M542 Cervicalgia: Secondary | ICD-10-CM | POA: Diagnosis not present

## 2021-01-27 DIAGNOSIS — M25512 Pain in left shoulder: Secondary | ICD-10-CM

## 2021-01-27 DIAGNOSIS — M25612 Stiffness of left shoulder, not elsewhere classified: Secondary | ICD-10-CM | POA: Diagnosis not present

## 2021-01-27 DIAGNOSIS — M6281 Muscle weakness (generalized): Secondary | ICD-10-CM | POA: Diagnosis not present

## 2021-01-27 NOTE — Therapy (Signed)
[place], [place], [address] Phone: [phone]   Fax:  [phone]  Physical Therapy Treatment  Patient Details  Name: Gloria Mccoy MRN: [id number] Date of Birth: 14-Feb-1946 Referring Provider (PT): Bo Merino MD   Encounter Date: 01/27/2021   PT End of Session - 01/27/21 0956     Visit Number 5    Number of Visits 8    Date for PT Re-Evaluation 02/24/21    Authorization Type human[employer] medicare - auth required, no VL    Progress Note Due on Visit 10    PT Start Time 0835    PT Stop Time 0920    PT Time Calculation (min) 45 min    Activity Tolerance Patient tolerated treatment well             Past Medical History:  Diagnosis Date   Arthritis    "hands sometimes" (07/13/2017)   Coronary artery disease    a. s/p CABG x3 in 06/2017 with LIMA-LAD, SVG-D1, and SVG-RI.  b. 10/2017: cath showing a widely patent LIMA-LAD with ostial occlusion of the SVG-RI and subtotally occluded atretic SVG-D1. Graft occlusion thought to be 2ry to improvement in pre-CABG stenoses.    Essential hypertension    Family history of adverse reaction to anesthesia    sister had a complication that was stated she had a "foggy" episode after her breast surgery was readmitted 1 day post op after being discharged from the hospital. Sister also has significant lung problems that could have contributed to this    Family history of breast cancer    Family history of colon cancer    Hypothyroid    Meniere disease    Myocardial infarction ([place]) 06/2017   during cardaic rehab   Obesity    Osteopenia 01/2012   T score -1.3 FRAX 7.9%/0.6%    Past Surgical History:  Procedure Laterality Date   BREAST LUMPECTOMY WITH RADIOACTIVE SEED AND SENTINEL LYMPH NODE BIOPSY Left 09/01/2018   Procedure: LEFT BREAST LUMPECTOMY WITH RADIOACTIVE SEED AND SENTINEL LYMPH NODE BIOPSY;  Surgeon: Jovita Kussmaul, MD;  Location: [facility];  Service: General;   Laterality: Left;   BREAST SURGERY     CARDIAC CATHETERIZATION  07/13/2017   CATARACT EXTRACTION W/PHACO Right 01/28/2015   Procedure: CATARACT EXTRACTION PHACO AND INTRAOCULAR LENS PLACEMENT :  CDE:  5.70;  Surgeon: Rutherford Guys, MD;  [facility];  Service: Ophthalmology;  Laterality: Right;   CATARACT EXTRACTION W/PHACO Left 02/11/2015   Procedure: CATARACT EXTRACTION PHACO AND INTRAOCULAR LENS PLACEMENT (IOC);  Surgeon: Rutherford Guys, MD;  Location[facility];  Service: Ophthalmology;  Laterality: Left;  CDE: 7.38   COLONOSCOPY N/A 09/26/2012   Procedure: COLONOSCOPY;  Surgeon: Jamesetta So, MD;  [facility];  Service: Gastroenterology;  Laterality: N/A;   CORONARY ARTERY BYPASS GRAFT N/A 07/15/2017   Procedure: CORONARY ARTERY BYPASS GRAFTING (CABG) X 3 USING LEFT INTERNAL MAMMARY ARTERY AND RIGHT SAPHENOUS VEIN- ENDOSCOPICALLY HARVESTED;  Surgeon: Melrose Nakayama, MD;  Location: [facility];  Service: Open Heart Surgery;  Laterality: N/A;   IR IMAGING GUIDED PORT INSERTION  12/29/2018   IR REMOVAL TUN ACCESS W/ PORT W/O FL MOD SED  04/26/2019   LEFT HEART CATH AND CORONARY ANGIOGRAPHY N/A 07/13/2017   Procedure: LEFT HEART CATH AND CORONARY ANGIOGRAPHY;  Surgeon: Martinique, Peter M, MD;  Location: [facility];  Service: Cardiovascular;  Laterality: N/A;   LEFT HEART CATH AND  CORS/GRAFTS ANGIOGRAPHY N/A 11/03/2017   Procedure: LEFT HEART CATH AND CORS/GRAFTS ANGIOGRAPHY;  Surgeon: Troy Sine, MD;  Location: [facility];  Service: Cardiovascular;  Laterality: N/A;   TEE WITHOUT CARDIOVERSION N/A 07/15/2017   Procedure: TRANSESOPHAGEAL ECHOCARDIOGRAM (TEE);  Surgeon: Melrose Nakayama, MD;  Location: [facility];  Service: Open Heart Surgery;  Laterality: N/A;   TUBAL LIGATION     TYMPANOPLASTY Left    fluid from ear drum    There were no vitals filed for this visit.   Subjective Assessment - 01/27/21 0857     Subjective pt states she's not having any pain.  OVerall  doing much better but still can't reach behind her back (like to fasten bra) and has difficulty reaching Lt under Rt armpit to put on deodorant.   States getting into her high cabinets and self grooming is now not an issue. Reports neck is much better and really no pain just end ROM soreness.    Currently in Pain? No/denies                [facility] PT Assessment - 01/27/21 0001       AROM   Left Shoulder Flexion 160 Degrees   measured supine, AROM   Left Shoulder ABduction 140 Degrees   measured supine AROM   Left Shoulder Internal Rotation 45 Degrees   measured supine with 90 degrees abduction AROM   Left Shoulder External Rotation 50 Degrees   measured supine with 90 degrees Abd AROM                          [facility] Adult PT Treatment/Exercise - 01/27/21 0001       Neck Exercises: Machines for Strengthening   UBE (Upper Arm Bike) 2' fwd, 2' bkwd      Neck Exercises: Standing   Wall Push Ups 10 reps    Wall Wash 30" each motion (A/P, lateral, CW, CCW)      Neck Exercises: Seated   X to V 10 reps    W Back 10 reps      Shoulder Exercises: Stretch   Corner Stretch 3 reps;30 seconds    Internal Rotation Stretch 3 reps    Internal Rotation Stretch Limitations 20 second with towel    External Rotation Stretch 3 reps;20 seconds   doorway stretch   Wall Stretch - Flexion 5 reps    Wall Stretch - Flexion Limitations wall walk to #12    Wall Stretch - ABduction 5 reps    Wall Stretch - ABduction Limitations wall walk to #10                      PT Short Term Goals - 01/07/21 0859       PT SHORT TERM GOAL #1   Title Patient will be able to demonstrate at least 135 degrees of left shoulder flexion to improve overhead reaching.    Baseline 7/20 has 150 degrees flexion in sitting    Time 4    Period Weeks    Status Achieved    Target Date 01/27/21      PT SHORT TERM GOAL #2   Title Patient will report at least 50% improvement in overall symptoms  and/or function to demonstrate improved functional mobility    Time 4    Period Weeks    Status On-going    Target Date 01/27/21      PT  SHORT TERM GOAL #3   Title Patient will be independent in self management strategies to improve quality of life and functional outcomes.    Time 4    Period Weeks    Status On-going    Target Date 01/27/21               PT Long Term Goals - 01/07/21 0901       PT LONG TERM GOAL #1   Title Patient will demonstrate at least 4/5 MMT in left shoulder    Time 8    Period Weeks    Status On-going      PT LONG TERM GOAL #2   Title Patient will improve on FOTO score to meet predicted outcomes to demonstrate improved functional mobility.    Time 8    Period Weeks    Status On-going      PT LONG TERM GOAL #3   Title Patient will report at least 75% improvement in overall symptoms and/or function to demonstrate improved functional mobility    Time 8    Period Weeks    Status On-going                   Plan - 01/27/21 1038     Clinical Impression Statement Began on UBE to warm up cervical and scapular mm.   Lt shoulder is following typical capsular pattern with ER/IR being most limited followed by ABD and flexion with most improved ROM at 160 degrees.  Added corner stretch and ER doorway stretch.  Worked on shoulder stability and activity endurance with added wall washing and wall push ups.  Pt with noted fatigue completing wall washing for 30 second intervals.  No increased pain or issues at end of session.    Personal Factors and Comorbidities Comorbidity 1;Comorbidity 2    Comorbidities CAD, HTN, osteopenia, obese,, history of left lumpectomy and luymph node removal secondary to breast cancer    Examination-Activity Limitations Reach Overhead;Lift;Carry;Dressing    Examination-Participation Restrictions Meal Prep;Laundry;Cleaning    Stability/Clinical Decision Making Stable/Uncomplicated    Rehab Potential Good    PT Frequency 1x  / week    PT Duration 8 weeks    PT Treatment/Interventions ADLs/Self Care Home Management;Cryotherapy;Electrical Stimulation;Moist Heat;Traction;Therapeutic exercise;Therapeutic activities;Neuromuscular re-education;Patient/family education;Passive range of motion;Dry needling;Joint Manipulations    PT Next Visit Plan shoulder/neck ROM, STM, shoulder mobilizations, isometrics and then progress strength as tolerated.  Progress postural strenght with manual as needed.  Update HEP with seated exercises    PT Home Exercise Plan AAROM shouldre flexion cane, cervical ROT supine. Supine shoulder AAROM ER, abduction, supine neck retraction, supine neck flexion, wall wash    Consulted and Agree with Plan of Care Patient             Patient will benefit from skilled therapeutic intervention in order to improve the following deficits and impairments:  Pain, Decreased range of motion, Impaired UE functional use, Postural dysfunction  Visit Diagnosis: Cervicalgia  Acute pain of left shoulder  Muscle weakness (generalized)  Decreased range of motion of left shoulder     Problem List Patient Active Problem List   Diagnosis Date Noted   Port-A-Cath in place 01/16/2019   Genetic testing 08/29/2018   Family history of breast cancer    Family history of colon cancer    Malignant neoplasm of lower-outer quadrant of left breast of female, estrogen receptor negative ([place]) 08/16/2018   NSTEMI (non-ST elevated myocardial infarction) ([place]) 11/02/2017   S/P  CABG x 3 07/15/2017   Coronary artery disease 07/15/2017   Unstable angina ([place]) 07/13/2017   Essential hypertension 07/13/2017   Obesity 07/13/2017   Hyperglycemia 07/13/2017   Hypothyroid 01/06/2012   Meniere disease    Arthritis    Teena Irani, PTA/CLT [phone]  Teena Irani 01/27/2021, 10:41 AM  [facility] [address] [place], [place], [address] Phone: [phone]   Fax:   [phone]  Name: JENNIFERANNE REDA MRN: [id number] Date of Birth: [birth year]

## 2021-02-03 ENCOUNTER — Encounter (HOSPITAL_COMMUNITY): Payer: Self-pay | Admitting: Physical Therapy

## 2021-02-03 ENCOUNTER — Other Ambulatory Visit: Payer: Self-pay

## 2021-02-03 ENCOUNTER — Ambulatory Visit (HOSPITAL_COMMUNITY): Payer: Medicare HMO | Admitting: Physical Therapy

## 2021-02-03 DIAGNOSIS — M542 Cervicalgia: Secondary | ICD-10-CM

## 2021-02-03 DIAGNOSIS — M6281 Muscle weakness (generalized): Secondary | ICD-10-CM

## 2021-02-03 DIAGNOSIS — M25512 Pain in left shoulder: Secondary | ICD-10-CM | POA: Diagnosis not present

## 2021-02-03 DIAGNOSIS — M25612 Stiffness of left shoulder, not elsewhere classified: Secondary | ICD-10-CM

## 2021-02-03 NOTE — Therapy (Signed)
[place], [place], [address] Phone: [phone]   Fax:  [phone]  Physical Therapy Treatment  Patient Details  Name: Gloria Mccoy MRN: [id number] Date of Birth: 11-11-1945 Referring Provider (PT): Bo Merino MD   Encounter Date: 02/03/2021   PT End of Session - 02/03/21 0751     Visit Number 6    Number of Visits 8    Date for PT Re-Evaluation 02/24/21    Authorization Type humana medicare - auth required, no VL    Progress Note Due on Visit 10    PT Start Time 0748    PT Stop Time 0827    PT Time Calculation (min) 39 min    Activity Tolerance Patient tolerated treatment well             Past Medical History:  Diagnosis Date   Arthritis    "hands sometimes" (07/13/2017)   Coronary artery disease    a. s/p CABG x3 in 06/2017 with LIMA-LAD, SVG-D1, and SVG-RI.  b. 10/2017: cath showing a widely patent LIMA-LAD with ostial occlusion of the SVG-RI and subtotally occluded atretic SVG-D1. Graft occlusion thought to be 2ry to improvement in pre-CABG stenoses.    Essential hypertension    Family history of adverse reaction to anesthesia    sister had a complication that was stated she had a "foggy" episode after her breast surgery was readmitted 1 day post op after being discharged from the hospital. Sister also has significant lung problems that could have contributed to this    Family history of breast cancer    Family history of colon cancer    Hypothyroid    Meniere disease    Myocardial infarction ([place]) 06/2017   during cardaic rehab   Obesity    Osteopenia 01/2012   T score -1.3 FRAX 7.9%/0.6%    Past Surgical History:  Procedure Laterality Date   BREAST LUMPECTOMY WITH RADIOACTIVE SEED AND SENTINEL LYMPH NODE BIOPSY Left 09/01/2018   Procedure: LEFT BREAST LUMPECTOMY WITH RADIOACTIVE SEED AND SENTINEL LYMPH NODE BIOPSY;  Surgeon: Jovita Kussmaul, MD;  Location: [facility];  Service: General;   Laterality: Left;   BREAST SURGERY     CARDIAC CATHETERIZATION  07/13/2017   CATARACT EXTRACTION W/PHACO Right 01/28/2015   Procedure: CATARACT EXTRACTION PHACO AND INTRAOCULAR LENS PLACEMENT :  CDE:  5.70;  Surgeon: Rutherford Guys, MD;  [facility];  Service: Ophthalmology;  Laterality: Right;   CATARACT EXTRACTION W/PHACO Left 02/11/2015   Procedure: CATARACT EXTRACTION PHACO AND INTRAOCULAR LENS PLACEMENT (IOC);  Surgeon: Rutherford Guys, MD;  [facility];  Service: Ophthalmology;  Laterality: Left;  CDE: 7.38   COLONOSCOPY N/A 09/26/2012   Procedure: COLONOSCOPY;  Surgeon: Jamesetta So, MD;  [facility];  Service: Gastroenterology;  Laterality: N/A;   CORONARY ARTERY BYPASS GRAFT N/A 07/15/2017   Procedure: CORONARY ARTERY BYPASS GRAFTING (CABG) X 3 USING LEFT INTERNAL MAMMARY ARTERY AND RIGHT SAPHENOUS VEIN- ENDOSCOPICALLY HARVESTED;  Surgeon: Melrose Nakayama, MD;  Location: [facility];  Service: Open Heart Surgery;  Laterality: N/A;   IR IMAGING GUIDED PORT INSERTION  12/29/2018   IR REMOVAL TUN ACCESS W/ PORT W/O FL MOD SED  04/26/2019   LEFT HEART CATH AND CORONARY ANGIOGRAPHY N/A 07/13/2017   Procedure: LEFT HEART CATH AND CORONARY ANGIOGRAPHY;  Surgeon: Martinique, Peter M, MD;  Location: [facility];  Service: Cardiovascular;  Laterality: N/A;   LEFT HEART CATH AND  CORS/GRAFTS ANGIOGRAPHY N/A 11/03/2017   Procedure: LEFT HEART CATH AND CORS/GRAFTS ANGIOGRAPHY;  Surgeon: Troy Sine, MD;  Location: [facility];  Service: Cardiovascular;  Laterality: N/A;   TEE WITHOUT CARDIOVERSION N/A 07/15/2017   Procedure: TRANSESOPHAGEAL ECHOCARDIOGRAM (TEE);  Surgeon: Melrose Nakayama, MD;  Location: [facility];  Service: Open Heart Surgery;  Laterality: N/A;   TUBAL LIGATION     TYMPANOPLASTY Left    fluid from ear drum    There were no vitals filed for this visit.   Subjective Assessment - 02/03/21 0752     Subjective Reports no pain, states she has been doing her  exercises. Reports she is 100% better and does exercises daily.    Currently in Pain? No/denies                [facility] PT Assessment - 02/03/21 0001       Assessment   Medical Diagnosis Left shoulder arthritis and cervical DDD    Referring Provider (PT) Bo Merino MD      Observation/Other Assessments   Focus on Therapeutic Outcomes (FOTO)  73% function, predicted 64% function      AROM   Right Shoulder Flexion 170 Degrees   standard position   Right Shoulder ABduction 160 Degrees   standard position   Right Shoulder Internal Rotation --   reaches to T 12 SP   Right Shoulder External Rotation --   reaches to T2 SP no pain   Left Shoulder Flexion 160 Degrees   standard position   Left Shoulder ABduction 150 Degrees   standard position   Left Shoulder Internal Rotation --   reaches to left lateral 10th rib   Left Shoulder External Rotation --   reaches to T1 SP no pain     Strength   Right Shoulder Flexion 4/5    Right Shoulder ABduction 4+/5   no pain   Right Shoulder Internal Rotation 4/5    Right Shoulder External Rotation 4-/5    Left Shoulder Flexion 4/5   no pain   Left Shoulder ABduction 4/5   no pain   Left Shoulder Internal Rotation 4/5    Left Shoulder External Rotation 4-/5   slight pain                          [facility] Adult PT Treatment/Exercise - 02/03/21 0001       Neck Exercises: Seated   Other Seated Exercise neck circles 2x5 bilateral      Shoulder Exercises: Standing   Flexion --   wall washes x25 B   Extension AROM;10 reps   dowel, 2 sets   Other Standing Exercises W's x15   cues for form - tries to extend elbows as compensation   Other Standing Exercises wall washes 2x15 B      Shoulder Exercises: Stretch   Internal Rotation Stretch 10 seconds   with twel behind back to the side   Internal Rotation Stretch Limitations x15 reps L:                    PT Education - 02/03/21 0820     Education Details on FOTO  score, on HEP and POC    Person(s) Educated Patient    Methods Explanation    Comprehension Verbalized understanding              PT Short Term Goals - 02/03/21 1975  PT SHORT TERM GOAL #1   Title Patient will be able to demonstrate at least 135 degrees of left shoulder flexion to improve overhead reaching.    Baseline 7/20 has 150 degrees flexion in sitting    Time 4    Period Weeks    Status Achieved    Target Date 01/27/21      PT SHORT TERM GOAL #2   Title Patient will report at least 50% improvement in overall symptoms and/or function to demonstrate improved functional mobility    Baseline 100% better    Time 4    Period Weeks    Status Achieved    Target Date 01/27/21      PT SHORT TERM GOAL #3   Title Patient will be independent in self management strategies to improve quality of life and functional outcomes.    Baseline performs HEP daily    Time 4    Period Weeks    Status Achieved    Target Date 01/27/21               PT Long Term Goals - 02/03/21 0758       PT LONG TERM GOAL #1   Title Patient will demonstrate at least 4/5 MMT in left shoulder    Baseline almost met    Time 8    Period Weeks    Status On-going      PT LONG TERM GOAL #2   Title Patient will improve on FOTO score to meet predicted outcomes to demonstrate improved functional mobility.    Time 8    Period Weeks    Status Achieved      PT LONG TERM GOAL #3   Title Patient will report at least 75% improvement in overall symptoms and/or function to demonstrate improved functional mobility    Baseline 100% better    Time 8    Period Weeks    Status Achieved                   Plan - 02/03/21 8242     Clinical Impression Statement Reduced shoulder external rotation noted with W's cues for form as patient tends to extend at elbow. Patient is making good progress and met all but one long term goal at this time. Reviewed HEP and answered her questions. Printed off  all exercises for HEP adherence. Will continue with current POC and anticipate DC from PT next session pending patient presentation.    Personal Factors and Comorbidities Comorbidity 1;Comorbidity 2    Comorbidities CAD, HTN, osteopenia, obese,, history of left lumpectomy and luymph node removal secondary to breast cancer    Examination-Activity Limitations Reach Overhead;Lift;Carry;Dressing    Examination-Participation Restrictions Meal Prep;Laundry;Cleaning    Stability/Clinical Decision Making Stable/Uncomplicated    Rehab Potential Good    PT Frequency 1x / week    PT Duration 8 weeks    PT Treatment/Interventions ADLs/Self Care Home Management;Cryotherapy;Electrical Stimulation;Moist Heat;Traction;Therapeutic exercise;Therapeutic activities;Neuromuscular re-education;Patient/family education;Passive range of motion;Dry needling;Joint Manipulations    PT Next Visit Plan anticipate DC- focus on left UE strength and printoffs for HEP and reaching behind back for HEP    PT Home Exercise Plan AAROM shouldre flexion cane, cervical ROT supine. Supine shoulder AAROM ER, abduction, supine neck retraction, supine neck flexion, wall wash    Consulted and Agree with Plan of Care Patient             Patient will benefit from skilled therapeutic intervention in order to improve  the following deficits and impairments:  Pain, Decreased range of motion, Impaired UE functional use, Postural dysfunction  Visit Diagnosis: Cervicalgia  Acute pain of left shoulder  Muscle weakness (generalized)  Decreased range of motion of left shoulder     Problem List Patient Active Problem List   Diagnosis Date Noted   Port-A-Cath in place 01/16/2019   Genetic testing 08/29/2018   Family history of breast cancer    Family history of colon cancer    Malignant neoplasm of lower-outer quadrant of left breast of female, estrogen receptor negative ([place]) 08/16/2018   NSTEMI (non-ST elevated myocardial  infarction) ([place]) 11/02/2017   S/P CABG x 3 07/15/2017   Coronary artery disease 07/15/2017   Unstable angina ([place]) 07/13/2017   Essential hypertension 07/13/2017   Obesity 07/13/2017   Hyperglycemia 07/13/2017   Hypothyroid 01/06/2012   Meniere disease    Arthritis    8:26 AM, 02/03/21 Jerene Pitch, DPT Physical Therapy with [facility]  [phone] office   [facility] [address] [place], [place], [address] Phone: [phone]   Fax:  [phone]  Name: Gloria Mccoy MRN: [id number] Date of Birth: 1945-12-07

## 2021-02-10 ENCOUNTER — Encounter (HOSPITAL_COMMUNITY): Payer: Self-pay | Admitting: Physical Therapy

## 2021-02-10 ENCOUNTER — Other Ambulatory Visit: Payer: Self-pay

## 2021-02-10 ENCOUNTER — Ambulatory Visit (HOSPITAL_COMMUNITY): Payer: Medicare HMO | Admitting: Physical Therapy

## 2021-02-10 DIAGNOSIS — M6281 Muscle weakness (generalized): Secondary | ICD-10-CM | POA: Diagnosis not present

## 2021-02-10 DIAGNOSIS — M25612 Stiffness of left shoulder, not elsewhere classified: Secondary | ICD-10-CM | POA: Diagnosis not present

## 2021-02-10 DIAGNOSIS — M25512 Pain in left shoulder: Secondary | ICD-10-CM | POA: Diagnosis not present

## 2021-02-10 DIAGNOSIS — M542 Cervicalgia: Secondary | ICD-10-CM | POA: Diagnosis not present

## 2021-02-10 NOTE — Therapy (Signed)
[facility] [address] [place], [place], [address] Phone: [phone]   Fax:  [phone]  Physical Therapy Treatment and Discharge Note  Patient Details  Name: Gloria Mccoy MRN: [id number] Date of Birth: [birth year] Referring Provider (PT): Bo Merino MD  PHYSICAL THERAPY DISCHARGE SUMMARY  Visits from Start of Care: 7  Current functional level related to goals / functional outcomes: See below   Remaining deficits: See below   Education / Equipment: See below   Patient agrees to discharge. Patient goals were partially met. Patient is being discharged due to being pleased with the current functional level.   Encounter Date: 02/10/2021   PT End of Session - 02/10/21 0832     Visit Number 7    Number of Visits 8    Date for PT Re-Evaluation 02/24/21    Authorization Type [employer] medicare - auth required, no VL    Progress Note Due on Visit 10    PT Start Time [phone]    PT Stop Time 0901    PT Time Calculation (min) 29 min    Activity Tolerance Patient tolerated treatment well             Past Medical History:  Diagnosis Date   Arthritis    "hands sometimes" (07/13/2017)   Coronary artery disease    a. s/p CABG x3 in 06/2017 with LIMA-LAD, SVG-D1, and SVG-RI.  b. 10/2017: cath showing a widely patent LIMA-LAD with ostial occlusion of the SVG-RI and subtotally occluded atretic SVG-D1. Graft occlusion thought to be 2ry to improvement in pre-CABG stenoses.    Essential hypertension    Family history of adverse reaction to anesthesia    sister had a complication that was stated she had a "foggy" episode after her breast surgery was readmitted 1 day post op after being discharged from the hospital. Sister also has significant lung problems that could have contributed to this    Family history of breast cancer    Family history of colon cancer    Hypothyroid    Meniere disease    Myocardial infarction ([place]) 06/2017    during cardaic rehab   Obesity    Osteopenia 01/2012   T score -1.3 FRAX 7.9%/0.6%    Past Surgical History:  Procedure Laterality Date   BREAST LUMPECTOMY WITH RADIOACTIVE SEED AND SENTINEL LYMPH NODE BIOPSY Left 09/01/2018   Procedure: LEFT BREAST LUMPECTOMY WITH RADIOACTIVE SEED AND SENTINEL LYMPH NODE BIOPSY;  Surgeon: Jovita Kussmaul, MD;  Location: [facility];  Service: General;  Laterality: Left;   BREAST SURGERY     CARDIAC CATHETERIZATION  07/13/2017   CATARACT EXTRACTION W/PHACO Right 01/28/2015   Procedure: CATARACT EXTRACTION PHACO AND INTRAOCULAR LENS PLACEMENT :  CDE:  5.70;  Surgeon: Rutherford Guys, MD;  Location: [facility];  Service: Ophthalmology;  Laterality: Right;   CATARACT EXTRACTION W/PHACO Left 02/11/2015   Procedure: CATARACT EXTRACTION PHACO AND INTRAOCULAR LENS PLACEMENT (IOC);  Surgeon: Rutherford Guys, MD;  Location: [facility];  Service: Ophthalmology;  Laterality: Left;  CDE: 7.38   COLONOSCOPY N/A 09/26/2012   Procedure: COLONOSCOPY;  Surgeon: Jamesetta So, MD;  [facility];  Service: Gastroenterology;  Laterality: N/A;   CORONARY ARTERY BYPASS GRAFT N/A 07/15/2017   Procedure: CORONARY ARTERY BYPASS GRAFTING (CABG) X 3 USING LEFT INTERNAL MAMMARY ARTERY AND RIGHT SAPHENOUS VEIN- ENDOSCOPICALLY HARVESTED;  Surgeon: Melrose Nakayama, MD;  Location: [facility];  Service: Open Heart Surgery;  Laterality: N/A;  IR IMAGING GUIDED PORT INSERTION  12/29/2018   IR REMOVAL TUN ACCESS W/ PORT W/O FL MOD SED  04/26/2019   LEFT HEART CATH AND CORONARY ANGIOGRAPHY N/A 07/13/2017   Procedure: LEFT HEART CATH AND CORONARY ANGIOGRAPHY;  Surgeon: Martinique, Peter M, MD;  Location: [facility];  Service: Cardiovascular;  Laterality: N/A;   LEFT HEART CATH AND CORS/GRAFTS ANGIOGRAPHY N/A 11/03/2017   Procedure: LEFT HEART CATH AND CORS/GRAFTS ANGIOGRAPHY;  Surgeon: Troy Sine, MD;  Location: [facility];  Service: Cardiovascular;  Laterality: N/A;   TEE WITHOUT  CARDIOVERSION N/A 07/15/2017   Procedure: TRANSESOPHAGEAL ECHOCARDIOGRAM (TEE);  Surgeon: Melrose Nakayama, MD;  Location: [facility];  Service: Open Heart Surgery;  Laterality: N/A;   TUBAL LIGATION     TYMPANOPLASTY Left    fluid from ear drum    There were no vitals filed for this visit.   Subjective Assessment - 02/10/21 0834     Subjective Reports no current pain.    Currently in Pain? No/denies              measurements taken from last session on 02/03/21  [facility] PT Assessment - 02/10/21 0001       Assessment   Medical Diagnosis Left shoulder arthritis and cervical DDD    Referring Provider (PT) Bo Merino MD      Observation/Other Assessments   Focus on Therapeutic Outcomes (FOTO)  73% function, predicted 64% function      AROM   Right Shoulder Flexion 170 Degrees   standard position   Right Shoulder ABduction 160 Degrees   standard position   Right Shoulder Internal Rotation --   reaches to T 12 SP   Right Shoulder External Rotation --   reaches to T2 SP no pain   Left Shoulder Flexion 160 Degrees   standard position   Left Shoulder ABduction 150 Degrees   standard position   Left Shoulder Internal Rotation --   reaches to left lateral 10th rib   Left Shoulder External Rotation --   reaches to T1 SP no pain     Strength   Right Shoulder Flexion 4/5    Right Shoulder ABduction 4+/5   no pain   Right Shoulder Internal Rotation 4/5    Right Shoulder External Rotation 4-/5    Left Shoulder Flexion 4/5   no pain   Left Shoulder ABduction 4/5   no pain   Left Shoulder Internal Rotation 4/5    Left Shoulder External Rotation 4-/5   slight pain                          [facility] Adult PT Treatment/Exercise - 02/10/21 0001       Neck Exercises: Theraband   Scapula Retraction Red;10 reps   2  sets   Scapula Retraction Limitations 5" holds    Shoulder Extension Red;15 reps   5" holds, 2 sets   Shoulder External Rotation Limitations 2x10 B red  band and use of towel    Horizontal ABduction Limitations --    Other Theraband Exercises D2 shulder flexion 2x10 5" holds RTB      Neck Exercises: Standing   Other Standing Exercises shoulder extension stretch with dowel - x15 5" holds      Shoulder Exercises: Standing   Flexion AAROM;Both;15 reps   dowel x2 sets   Other Standing Exercises AAROm with towel at wall x20 B  PT Education - 02/10/21 0844     Education Details on current presenation, on HEP and POC  moving forward    Person(s) Educated Patient    Methods Explanation    Comprehension Verbalized understanding              PT Short Term Goals - 02/03/21 0758       PT SHORT TERM GOAL #1   Title Patient will be able to demonstrate at least 135 degrees of left shoulder flexion to improve overhead reaching.    Baseline 7/20 has 150 degrees flexion in sitting    Time 4    Period Weeks    Status Achieved    Target Date 01/27/21      PT SHORT TERM GOAL #2   Title Patient will report at least 50% improvement in overall symptoms and/or function to demonstrate improved functional mobility    Baseline 100% better    Time 4    Period Weeks    Status Achieved    Target Date 01/27/21      PT SHORT TERM GOAL #3   Title Patient will be independent in self management strategies to improve quality of life and functional outcomes.    Baseline performs HEP daily    Time 4    Period Weeks    Status Achieved    Target Date 01/27/21               PT Long Term Goals - 02/03/21 0758       PT LONG TERM GOAL #1   Title Patient will demonstrate at least 4/5 MMT in left shoulder    Baseline almost met    Time 8    Period Weeks    Status On-going      PT LONG TERM GOAL #2   Title Patient will improve on FOTO score to meet predicted outcomes to demonstrate improved functional mobility.    Time 8    Period Weeks    Status Achieved      PT LONG TERM GOAL #3   Title Patient will report  at least 75% improvement in overall symptoms and/or function to demonstrate improved functional mobility    Baseline 100% better    Time 8    Period Weeks    Status Achieved                   Plan - 02/10/21 0857     Clinical Impression Statement Patient has met all but one long term goal at this time. Progressed HEP per patient request to continue to work on left UE strengthening. Added theraband exercises to HEP and provided patient with theraband for adherence to HEP. Answered all questions and patient to discharge from PT to HEP at this time secondary to progress made.    Personal Factors and Comorbidities Comorbidity 1;Comorbidity 2    Comorbidities CAD, HTN, osteopenia, obese,, history of left lumpectomy and luymph node removal secondary to breast cancer    Examination-Activity Limitations Reach Overhead;Lift;Carry;Dressing    Examination-Participation Restrictions Meal Prep;Laundry;Cleaning    Stability/Clinical Decision Making Stable/Uncomplicated    Rehab Potential Good    PT Frequency 1x / week    PT Duration 8 weeks    PT Treatment/Interventions ADLs/Self Care Home Management;Cryotherapy;Electrical Stimulation;Moist Heat;Traction;Therapeutic exercise;Therapeutic activities;Neuromuscular re-education;Patient/family education;Passive range of motion;Dry needling;Joint Manipulations    PT Next Visit Plan DC to HEP.    PT Home Exercise Plan AAROM shouldre flexion cane, cervical ROT supine. Supine  shoulder AAROM ER, abduction, supine neck retraction, supine neck flexion, wall wash; TB exercises    Consulted and Agree with Plan of Care Patient             Patient will benefit from skilled therapeutic intervention in order to improve the following deficits and impairments:  Pain, Decreased range of motion, Impaired UE functional use, Postural dysfunction  Visit Diagnosis: Cervicalgia  Acute pain of left shoulder  Muscle weakness (generalized)  Decreased range of  motion of left shoulder     Problem List Patient Active Problem List   Diagnosis Date Noted   Port-A-Cath in place 01/16/2019   Genetic testing 08/29/2018   Family history of breast cancer    Family history of colon cancer    Malignant neoplasm of lower-outer quadrant of left breast of female, estrogen receptor negative ([place]) 08/16/2018   NSTEMI (non-ST elevated myocardial infarction) ([place]) 11/02/2017   S/P CABG x 3 07/15/2017   Coronary artery disease 07/15/2017   Unstable angina ([place]) 07/13/2017   Essential hypertension 07/13/2017   Obesity 07/13/2017   Hyperglycemia 07/13/2017   Hypothyroid 01/06/2012   Meniere disease    Arthritis     9:05 AM, 02/10/21 Jerene Pitch, DPT Physical Therapy with [facility]  [phone] office   [facility] [address] [place], [place], [address] Phone: [phone]   Fax:  [phone]  Name: Gloria Mccoy MRN: [id number] Date of Birth: [birth year]

## 2021-02-25 ENCOUNTER — Ambulatory Visit: Payer: Medicare HMO | Admitting: Cardiology

## 2021-02-25 ENCOUNTER — Encounter: Payer: Self-pay | Admitting: Cardiology

## 2021-02-25 ENCOUNTER — Other Ambulatory Visit: Payer: Self-pay

## 2021-02-25 VITALS — BP 120/60 | HR 69 | Ht 62.0 in | Wt 207.0 lb

## 2021-02-25 DIAGNOSIS — I10 Essential (primary) hypertension: Secondary | ICD-10-CM

## 2021-02-25 DIAGNOSIS — I2510 Atherosclerotic heart disease of native coronary artery without angina pectoris: Secondary | ICD-10-CM

## 2021-02-25 DIAGNOSIS — E782 Mixed hyperlipidemia: Secondary | ICD-10-CM

## 2021-02-25 NOTE — Patient Instructions (Signed)
Medication Instructions:  Your physician recommends that you continue on your current medications as directed. Please refer to the Current Medication list given to you today.  *If you need a refill on your cardiac medications before your next appointment, please call your pharmacy*   Lab Work: We have requested your most recent lab work from your primary care provider If you have labs (blood work) drawn today and your tests are completely normal, you will receive your results only by: MyChart Message (if you have MyChart) OR A paper copy in the mail If you have any lab test that is abnormal or we need to change your treatment, we will call you to review the results.   Testing/Procedures: None   Follow-Up: At [facility], you and your health needs are our priority.  As part of our continuing mission to provide you with exceptional heart care, we have created designated Provider Care Teams.  These Care Teams include your primary Cardiologist (physician) and Advanced Practice Providers (APPs -  Physician Assistants and Nurse Practitioners) who all work together to provide you with the care you need, when you need it.  We recommend signing up for the patient portal called "MyChart".  Sign up information is provided on this After Visit Summary.  MyChart is used to connect with patients for Virtual Visits (Telemedicine).  Patients are able to view lab/test results, encounter notes, upcoming appointments, etc.  Non-urgent messages can be sent to your provider as well.   To learn more about what you can do with MyChart, go to [email or web address].    Your next appointment:   6 month(s)  The format for your next appointment:   In Person  Provider:   Jonathan Branch, MD   Other Instructions    

## 2021-02-25 NOTE — Progress Notes (Signed)
Clinical Summary Ms. Phung is a 75 y.o.female seen today for follow up of the following medical problems.    1. CAD - admit Jan 2019 with unstable angina. - Jan 2019 with distal LM 80%, prox LAD 90%. She was referred for CABG/ s/p LIMA-LAD, SVG-D1, SVG-RI - Jan 2019 echo LVEF 55-60%   - 10/2017 admitted with chest pain. Admitted with NSTEMI - 10/2017 cath with ostial LAD 45%, mid LAD 80%, D1 40%, ramus 20%, LCX 25%, RCA patent. LIMA-LAD patent, SVG-ramus occluded, SVG-D1 99%. Recs for medical therapy, discharged on DAPT with plans for 1 year of treatment.        - no chest pains - compliant withmeds     2. Hyperlipidemia   - at 12/2019 visit had run out of atorvastatin, was restarted at that time.  - reports recent labs with pcp  - compliant with statin   3. HTN Compliant with meds     4. Breast cancer - s/p lumpectomy -she is on chemoradiation   Past Medical History:  Diagnosis Date   Arthritis    "hands sometimes" (07/13/2017)   Coronary artery disease    a. s/p CABG x3 in 06/2017 with LIMA-LAD, SVG-D1, and SVG-RI.  b. 10/2017: cath showing a widely patent LIMA-LAD with ostial occlusion of the SVG-RI and subtotally occluded atretic SVG-D1. Graft occlusion thought to be 2ry to improvement in pre-CABG stenoses.    Essential hypertension    Family history of adverse reaction to anesthesia    sister had a complication that was stated she had a "foggy" episode after her breast surgery was readmitted 1 day post op after being discharged from the hospital. Sister also has significant lung problems that could have contributed to this    Family history of breast cancer    Family history of colon cancer    Hypothyroid    Meniere disease    Myocardial infarction ([place]) 06/2017   during cardaic rehab   Obesity    Osteopenia 01/2012   T score -1.3 FRAX 7.9%/0.6%     Allergies  Allergen Reactions   Aloe Vera Itching    Per patient severe itching   Sulfa Antibiotics  Shortness Of Breath   Sulfasalazine Shortness Of Breath     Current Outpatient Medications  Medication Sig Dispense Refill   acetaminophen (TYLENOL) 325 MG tablet Take 2 tablets (650 mg total) by mouth every 6 (six) hours as needed for mild pain.     aspirin EC 81 MG tablet Take 81 mg by mouth daily.     atorvastatin (LIPITOR) 80 MG tablet TAKE 1 TABLET BY MOUTH ONCE DAILY AT  6PM 90 tablet 3   cetaphil (CETAPHIL) lotion Apply 1 application topically as needed for dry skin. 236 mL 0   Cholecalciferol (VITAMIN D PO) Take 1 tablet by mouth daily.      Cyanocobalamin (VITAMIN B-12 PO) Take 1 tablet by mouth daily.     fish oil-omega-3 fatty acids 1000 MG capsule Take 1 g by mouth daily.     levothyroxine (SYNTHROID, LEVOTHROID) 75 MCG tablet Take 75 mcg by mouth daily.     losartan (COZAAR) 25 MG tablet Take 0.5 tablets (12.5 mg total) by mouth daily. 45 tablet 3   metoprolol tartrate (LOPRESSOR) 25 MG tablet TAKE 1 TABLET TWICE DAILY 180 tablet 3   nitroGLYCERIN (NITROSTAT) 0.4 MG SL tablet PLACE 1 TABLET UNDER THE TONGUE EVERY 5 (FIVE) MINUTES AS DIRECTED AS NEEDED FOR CHEST PAIN. 25  tablet 3   vitamin E 100 UNIT capsule Take 1 capsule (100 Units total) by mouth daily. 30 capsule 0   No current facility-administered medications for this visit.     Past Surgical History:  Procedure Laterality Date   BREAST LUMPECTOMY WITH RADIOACTIVE SEED AND SENTINEL LYMPH NODE BIOPSY Left 09/01/2018   Procedure: LEFT BREAST LUMPECTOMY WITH RADIOACTIVE SEED AND SENTINEL LYMPH NODE BIOPSY;  Surgeon: Jovita Kussmaul, MD;  Location: [facility];  Service: General;  Laterality: Left;   BREAST SURGERY     CARDIAC CATHETERIZATION  07/13/2017   CATARACT EXTRACTION W/PHACO Right 01/28/2015   Procedure: CATARACT EXTRACTION PHACO AND INTRAOCULAR LENS PLACEMENT :  CDE:  5.70;  Surgeon: Rutherford Guys, MD;  [facility];  Service: Ophthalmology;  Laterality: Right;   CATARACT EXTRACTION W/PHACO Left 02/11/2015    Procedure: CATARACT EXTRACTION PHACO AND INTRAOCULAR LENS PLACEMENT (IOC);  Surgeon: Rutherford Guys, MD;  [facility];  Service: Ophthalmology;  Laterality: Left;  CDE: 7.38   COLONOSCOPY N/A 09/26/2012   Procedure: COLONOSCOPY;  Surgeon: Jamesetta So, MD;  [facility];  Service: Gastroenterology;  Laterality: N/A;   CORONARY ARTERY BYPASS GRAFT N/A 07/15/2017   Procedure: CORONARY ARTERY BYPASS GRAFTING (CABG) X 3 USING LEFT INTERNAL MAMMARY ARTERY AND RIGHT SAPHENOUS VEIN- ENDOSCOPICALLY HARVESTED;  Surgeon: Melrose Nakayama, MD;  Location: [facility];  Service: Open Heart Surgery;  Laterality: N/A;   IR IMAGING GUIDED PORT INSERTION  12/29/2018   IR REMOVAL TUN ACCESS W/ PORT W/O FL MOD SED  04/26/2019   LEFT HEART CATH AND CORONARY ANGIOGRAPHY N/A 07/13/2017   Procedure: LEFT HEART CATH AND CORONARY ANGIOGRAPHY;  Surgeon: Martinique, Peter M, MD;  Location: [facility];  Service: Cardiovascular;  Laterality: N/A;   LEFT HEART CATH AND CORS/GRAFTS ANGIOGRAPHY N/A 11/03/2017   Procedure: LEFT HEART CATH AND CORS/GRAFTS ANGIOGRAPHY;  Surgeon: Troy Sine, MD;  Location: [facility];  Service: Cardiovascular;  Laterality: N/A;   TEE WITHOUT CARDIOVERSION N/A 07/15/2017   Procedure: TRANSESOPHAGEAL ECHOCARDIOGRAM (TEE);  Surgeon: Melrose Nakayama, MD;  Location: [facility];  Service: Open Heart Surgery;  Laterality: N/A;   TUBAL LIGATION     TYMPANOPLASTY Left    fluid from ear drum     Allergies  Allergen Reactions   Aloe Vera Itching    Per patient severe itching   Sulfa Antibiotics Shortness Of Breath   Sulfasalazine Shortness Of Breath      Family History  Problem Relation Age of Onset   Diabetes Sister        AODM   Heart disease Sister 48       CABG   Breast cancer Sister    Heart disease Brother 81       In his 88s   Colon cancer Maternal Uncle 70   Diabetes Sister        AODM   Hypertension Daughter    Hypertension Daughter    Heart attack  Father        In his 66s   Stroke Mother    Stroke Maternal Grandmother    Diabetes Maternal Grandfather        d. 109   Heart attack Paternal Grandmother    Colon cancer Maternal Uncle 62   Breast cancer Cousin        dx 72s; d. 72s   Breast cancer Niece 55     Social History Ms. Boulet reports that she has never  smoked. She quit smokeless tobacco use about 3 years ago.  Her smokeless tobacco use included snuff. Ms. Dickenson reports that she does not currently use alcohol after a past usage of about 1.0 standard drink per week.   Review of Systems CONSTITUTIONAL: No weight loss, fever, chills, weakness or fatigue.  HEENT: Eyes: No visual loss, blurred vision, double vision or yellow sclerae.No hearing loss, sneezing, congestion, runny nose or sore throat.  SKIN: No rash or itching.  CARDIOVASCULAR: per hpi RESPIRATORY: No shortness of breath, cough or sputum.  GASTROINTESTINAL: No anorexia, nausea, vomiting or diarrhea. No abdominal pain or blood.  GENITOURINARY: No burning on urination, no polyuria NEUROLOGICAL: No headache, dizziness, syncope, paralysis, ataxia, numbness or tingling in the extremities. No change in bowel or bladder control.  MUSCULOSKELETAL: No muscle, back pain, joint pain or stiffness.  LYMPHATICS: No enlarged nodes. No history of splenectomy.  PSYCHIATRIC: No history of depression or anxiety.  ENDOCRINOLOGIC: No reports of sweating, cold or heat intolerance. No polyuria or polydipsia.  Marland Kitchen   Physical Examination Today's Vitals   02/25/21 1327  BP: 120/60  Pulse: 69  SpO2: 95%  Weight: 207 lb (93.9 kg)  Height: '5\' 2"'$  (1.575 m)   Body mass index is 37.86 kg/m.  Gen: resting comfortably, no acute distress HEENT: no scleral icterus, pupils equal round and reactive, no palptable cervical adenopathy,  CV: RRR, no m/r/g, no jvd Resp: Clear to auscultation bilaterally GI: abdomen is soft, non-tender, non-distended, normal bowel sounds, no  hepatosplenomegaly MSK: extremities are warm, no edema.  Skin: warm, no rash Neuro:  no focal deficits Psych: appropriate affect   Diagnostic Studies Jan 2019 cath Mid LM lesion is 25% stenosed. Dist LM to Ost LAD lesion is 80% stenosed. Ost Cx to Prox Cx lesion is 30% stenosed. Ost 1st Mrg lesion is 50% stenosed. Prox LAD lesion is 90% stenosed. The left ventricular systolic function is normal. LV end diastolic pressure is normal. The left ventricular ejection fraction is 55-65% by visual estimate.   1. Single vessel obstructive CAD. Patient has complex ostial and proximal to mid LAD disease that involves the origin of the ramus intermediate and first diagonal respectively. 2. Normal LV function 3. Normal LVEDP   Plan: given complexity of lesions with ostial and bifurcation LAD disease I would recommend consideration for CABG.   Jan 2019 Carotid US Final Interpretation: Right Carotid: Velocities in the right ICA are consistent with a 1-39% stenosis.  Left Carotid: Velocities in the left ICA are consistent with a 1-39% stenosis. Vertebrals:  Both vertebral arteries were patent with antegrade flow. Subclavians:   Jan 2019 echo Study Conclusions   - Left ventricle: The cavity size was normal. Wall thickness was   increased in a pattern of mild LVH. Systolic function was normal.   The estimated ejection fraction was in the range of 55% to 60%.   Wall motion was normal; there were no regional wall motion   abnormalities. Doppler parameters are consistent with abnormal   left ventricular relaxation (grade 1 diastolic dysfunction). - Mitral valve: Valve area by pressure half-time: 1.26 cm^2.   Impressions:   - Normal LV systolic function; mild LVH; mild diastolic   dysfunction.   10/2017 cath Ost 1st Diag to 1st Diag lesion is 40% stenosed. Ost Cx lesion is 25% stenosed. Ost Ramus lesion is 20% stenosed. Ost LAD lesion is 45% stenosed. Prox LAD to Mid LAD lesion is 80%  stenosed. Origin lesion is 100% stenosed. Origin lesion is  99% stenosed. Origin to Dist Graft lesion is 99% stenosed. Dist Graft lesion is 100% stenosed. LV end diastolic pressure is mildly elevated. The left ventricular systolic function is normal.   Hyperdynamic LV function with an EF of 65% without focal segmental wall motion abnormalities.   Native coronary obstructive disease with 40% ostial LAD stenosis, diffuse 40 to 50% proximal diagonal stenosis with 80% LAD stenosis diffusely after the diagonal takeoff and evidence for competitive filling to the mid LAD via the LIMA graft; 25% ostial smooth narrowing in the ramus intermediate vessel; 25% ostial smooth narrowing in the left circumflex vessel; normal RCA.   Widely patent LIMA graft which supplies the mid LAD.   Totally ostial occlusion of the SVG which had supplied the ramus intermediate vessel.   Subtotally occluded ostial vein graft with diffuse 99% narrowing insistent with an atretic graft  which does not fill the diagonal vessel.   POST CATH RECOMMENDATION: Medical therapy.  I suspect the early vein graft occlusion was contributed by improvement in the prior pre-CABG stenoses.    Assessment and Plan  1. CAD -no symptoms, continue current meds - EKG shows SR, no acute ischemic changes   2. Hyperlipidemia - request labs from pcp, continue atorvastatin   3. HTN - at goal, continue current meds      Arnoldo Lenis, M.D.

## 2021-02-25 NOTE — Addendum Note (Signed)
Addended by: Christella Scheuermann C on: 02/25/2021 02:02 PM   Modules accepted: Orders

## 2021-02-28 ENCOUNTER — Other Ambulatory Visit: Payer: Self-pay | Admitting: Student

## 2021-03-04 ENCOUNTER — Ambulatory Visit: Payer: Medicare HMO | Admitting: Diagnostic Neuroimaging

## 2021-04-21 ENCOUNTER — Ambulatory Visit: Payer: Medicare HMO | Admitting: Diagnostic Neuroimaging

## 2021-04-21 ENCOUNTER — Encounter: Payer: Self-pay | Admitting: Diagnostic Neuroimaging

## 2021-04-21 VITALS — BP 132/84 | HR 77 | Ht 62.0 in | Wt 213.0 lb

## 2021-04-21 DIAGNOSIS — M609 Myositis, unspecified: Secondary | ICD-10-CM

## 2021-04-21 NOTE — Progress Notes (Signed)
[facility]  PATIENT: Gloria Mccoy DOB: May 23, 1946  REFERRING CLINICIAN: Bo Merino, MD HISTORY FROM: PATIENT  REASON FOR VISIT: NEW CONSULT    HISTORICAL  CHIEF COMPLAINT:  Chief Complaint  Patient presents with   New Patient (Initial Visit)    Rm 6 with daughter- Pt reports she is here to discuss worsening bilateral arm pain. Wanted to discuss having NCS/EMG study.    HISTORY OF PRESENT ILLNESS:   74 year old female here for evaluation of myositis.  Patient has 6 to 12 months of progressive pain in her shoulders, arms, with elevated CK 494 in May 2022.  Myositis antibody testing was slightly abnormal (PL-7 ab).  However repeat CK testing was normal at 89.  Patient symptoms have slightly improved.  She also has tendinitis, arthritis, labral tearing in the left shoulder on MRI.    REVIEW OF SYSTEMS: Full 14 system review of systems performed and negative with exception of: As per HPI.  ALLERGIES: Allergies  Allergen Reactions   Aloe Vera Itching    Per patient severe itching   Sulfa Antibiotics Shortness Of Breath   Sulfasalazine Shortness Of Breath    HOME MEDICATIONS: Outpatient Medications Prior to Visit  Medication Sig Dispense Refill   acetaminophen (TYLENOL) 325 MG tablet Take 2 tablets (650 mg total) by mouth every 6 (six) hours as needed for mild pain.     aspirin EC 81 MG tablet Take 81 mg by mouth daily.     atorvastatin (LIPITOR) 80 MG tablet TAKE 1 TABLET BY MOUTH ONCE DAILY AT  6PM 90 tablet 3   cetaphil (CETAPHIL) lotion Apply 1 application topically as needed for dry skin. 236 mL 0   Cholecalciferol (VITAMIN D PO) Take 1 tablet by mouth daily.      Cyanocobalamin (VITAMIN B-12 PO) Take 1 tablet by mouth daily.     fish oil-omega-3 fatty acids 1000 MG capsule Take 1 g by mouth daily.     levothyroxine (SYNTHROID, LEVOTHROID) 75 MCG tablet Take 75 mcg by mouth daily.     losartan (COZAAR) 25 MG tablet TAKE 1/2 TABLET  EVERY DAY 45 tablet 3   metoprolol tartrate (LOPRESSOR) 25 MG tablet TAKE 1 TABLET TWICE DAILY 180 tablet 3   nitroGLYCERIN (NITROSTAT) 0.4 MG SL tablet PLACE 1 TABLET UNDER THE TONGUE EVERY 5 (FIVE) MINUTES AS DIRECTED AS NEEDED FOR CHEST PAIN. 25 tablet 3   vitamin E 100 UNIT capsule Take 1 capsule (100 Units total) by mouth daily. 30 capsule 0   No facility-administered medications prior to visit.    PAST MEDICAL HISTORY: Past Medical History:  Diagnosis Date   Arthritis    "hands sometimes" (07/13/2017)   Coronary artery disease    a. s/p CABG x3 in 06/2017 with LIMA-LAD, SVG-D1, and SVG-RI.  b. 10/2017: cath showing a widely patent LIMA-LAD with ostial occlusion of the SVG-RI and subtotally occluded atretic SVG-D1. Graft occlusion thought to be 2ry to improvement in pre-CABG stenoses.    Essential hypertension    Family history of adverse reaction to anesthesia    sister had a complication that was stated she had a "foggy" episode after her breast surgery was readmitted 1 day post op after being discharged from the hospital. Sister also has significant lung problems that could have contributed to this    Family history of breast cancer    Family history of colon cancer    Hypothyroid    Meniere disease    Myocardial infarction ([place]) 06/2017  during cardaic rehab   Obesity    Osteopenia 01/2012   T score -1.3 FRAX 7.9%/0.6%    PAST SURGICAL HISTORY: Past Surgical History:  Procedure Laterality Date   BREAST LUMPECTOMY WITH RADIOACTIVE SEED AND SENTINEL LYMPH NODE BIOPSY Left 09/01/2018   Procedure: LEFT BREAST LUMPECTOMY WITH RADIOACTIVE SEED AND SENTINEL LYMPH NODE BIOPSY;  Surgeon: Jovita Kussmaul, MD;  Location: [place];  Service: General;  Laterality: Left;   BREAST SURGERY     CARDIAC CATHETERIZATION  07/13/2017   CATARACT EXTRACTION W/PHACO Right 01/28/2015   Procedure: CATARACT EXTRACTION PHACO AND INTRAOCULAR LENS PLACEMENT :  CDE:  5.70;  Surgeon: Rutherford Guys, MD;   [facility]: [facility];  Service: Ophthalmology;  Laterality: Right;   CATARACT EXTRACTION W/PHACO Left 02/11/2015   Procedure: CATARACT EXTRACTION PHACO AND INTRAOCULAR LENS PLACEMENT (IOC);  Surgeon: Rutherford Guys, MD;  Location: [facility];  Service: Ophthalmology;  Laterality: Left;  CDE: 7.38   COLONOSCOPY N/A 09/26/2012   Procedure: COLONOSCOPY;  Surgeon: Jamesetta So, MD;  [facility];  Service: Gastroenterology;  Laterality: N/A;   CORONARY ARTERY BYPASS GRAFT N/A 07/15/2017   Procedure: CORONARY ARTERY BYPASS GRAFTING (CABG) X 3 USING LEFT INTERNAL MAMMARY ARTERY AND RIGHT SAPHENOUS VEIN- ENDOSCOPICALLY HARVESTED;  Surgeon: Melrose Nakayama, MD;  Location: [place];  Service: Open Heart Surgery;  Laterality: N/A;   IR IMAGING GUIDED PORT INSERTION  12/29/2018   IR REMOVAL TUN ACCESS W/ PORT W/O FL MOD SED  04/26/2019   LEFT HEART CATH AND CORONARY ANGIOGRAPHY N/A 07/13/2017   Procedure: LEFT HEART CATH AND CORONARY ANGIOGRAPHY;  Surgeon: Martinique, Peter M, MD;  Location: [place] [facility];  Service: Cardiovascular;  Laterality: N/A;   LEFT HEART CATH AND CORS/GRAFTS ANGIOGRAPHY N/A 11/03/2017   Procedure: LEFT HEART CATH AND CORS/GRAFTS ANGIOGRAPHY;  Surgeon: Troy Sine, MD;  Location: [place] [facility];  Service: Cardiovascular;  Laterality: N/A;   TEE WITHOUT CARDIOVERSION N/A 07/15/2017   Procedure: TRANSESOPHAGEAL ECHOCARDIOGRAM (TEE);  Surgeon: Melrose Nakayama, MD;  Location: [place];  Service: Open Heart Surgery;  Laterality: N/A;   TUBAL LIGATION     TYMPANOPLASTY Left    fluid from ear drum    FAMILY HISTORY: Family History  Problem Relation Age of Onset   Diabetes Sister        AODM   Heart disease Sister 36       CABG   Breast cancer Sister    Heart disease Brother 42       In his 13s   Colon cancer Maternal Uncle 57   Diabetes Sister        AODM   Hypertension Daughter    Hypertension Daughter    Heart attack Father        In his 50s   Stroke Mother     Stroke Maternal Grandmother    Diabetes Maternal Grandfather        d. 109   Heart attack Paternal Grandmother    Colon cancer Maternal Uncle 28   Breast cancer Cousin        dx 9s; d. 53s   Breast cancer Niece 15    SOCIAL HISTORY: Social History   Socioeconomic History   Marital status: Married    Spouse name: Not on file   Number of children: Not on file   Years of education: Not on file   Highest education level: Not on file  Occupational History   Occupation: Retired  Tobacco Use   Smoking status: Never   Smokeless tobacco: Former    Types: Snuff    Quit date: 07/31/2017  Vaping Use   Vaping Use: Never used  Substance and Sexual Activity   Alcohol use: Not Currently    Alcohol/week: 1.0 standard drink    Types: 1 Standard drinks or equivalent per week    Comment: occasionally   Drug use: No   Sexual activity: Yes    Birth control/protection: Surgical  Other Topics Concern   Not on file  Social History Narrative      Left handed    Lives at home with husband    Caffeine- some    Social Determinants of Health   Financial Resource Strain: Not on file  Food Insecurity: Not on file  Transportation Needs: Not on file  Physical Activity: Not on file  Stress: Not on file  Social Connections: Not on file  Intimate Partner Violence: Not on file     PHYSICAL EXAM  GENERAL EXAM/CONSTITUTIONAL: Vitals:  Vitals:   04/21/21 1301  BP: 132/84  Pulse: 77  SpO2: 96%  Weight: 213 lb (96.6 kg)  Height: 5\' 2"  (1.575 m)   Body mass index is 38.96 kg/m. Wt Readings from Last 3 Encounters:  04/21/21 213 lb (96.6 kg)  02/25/21 207 lb (93.9 kg)  12/03/20 207 lb (93.9 kg)   Patient is in no distress; well developed, nourished and groomed; neck is supple  CARDIOVASCULAR: Examination of carotid arteries is normal; no carotid bruits Regular rate and rhythm, no murmurs Examination of peripheral vascular system by observation and palpation is  normal  EYES: Ophthalmoscopic exam of optic discs and posterior segments is normal; no papilledema or hemorrhages No results found.  MUSCULOSKELETAL: Gait, strength, tone, movements noted in Neurologic exam below  NEUROLOGIC: MENTAL STATUS:  No flowsheet data found. awake, alert, oriented to person, place and time recent and remote memory intact normal attention and concentration language fluent, comprehension intact, naming intact fund of knowledge appropriate  CRANIAL NERVE:  2nd - no papilledema on fundoscopic exam 2nd, 3rd, 4th, 6th - pupils equal and reactive to light, visual fields full to confrontation, extraocular muscles intact, no nystagmus 5th - facial sensation symmetric 7th - facial strength symmetric 8th - hearing intact 9th - palate elevates symmetrically, uvula midline 11th - shoulder shrug symmetric 12th - tongue protrusion midline  MOTOR:  normal bulk and tone, full strength in the BUE, BLE  SENSORY:  normal and symmetric to light touch, temperature, vibration  COORDINATION:  finger-nose-finger, fine finger movements normal  REFLEXES:  deep tendon reflexes TRACE and symmetric  GAIT/STATION:  narrow based gait     DIAGNOSTIC DATA (LABS, IMAGING, TESTING) - I reviewed patient records, labs, notes, testing and imaging myself where available.  Lab Results  Component Value Date   WBC 9.3 10/16/2020   HGB 13.2 10/16/2020   HCT 36.4 10/16/2020   MCV 85.2 10/16/2020   PLT 224 10/16/2020      Component Value Date/Time   NA 143 10/16/2020 0820   K 4.1 10/16/2020 0820   CL 107 10/16/2020 0820   CO2 29 10/16/2020 0820   GLUCOSE 108 (H) 10/16/2020 0820   BUN 10 10/16/2020 0820   CREATININE 0.80 10/16/2020 0820   CREATININE 0.78 01/16/2019 0840   CALCIUM 8.9 10/16/2020 0820   PROT 6.7 11/12/2020 1210   ALBUMIN 3.7 10/16/2020 0820   AST 21 10/16/2020 0820   AST 22 01/16/2019 0840   ALT 18  10/16/2020 0820   ALT 10 01/16/2019 0840   ALKPHOS  85 10/16/2020 0820   BILITOT 0.9 10/16/2020 0820   BILITOT 0.7 01/16/2019 0840   GFRNONAA >60 10/16/2020 0820   GFRNONAA >60 01/16/2019 0840   GFRAA >60 04/26/2019 1338   GFRAA >60 01/16/2019 0840   Lab Results  Component Value Date   CHOL 79 11/18/2017   HDL 25 (L) 11/18/2017   LDLCALC 44 11/18/2017   TRIG 52 11/18/2017   CHOLHDL 3.2 11/18/2017   Lab Results  Component Value Date   HGBA1C 5.6 07/14/2017   No results found for: [id number] Lab Results  Component Value Date   TSH 3.780 07/13/2017    No components found for: CK Total CK  Date Value Ref Range Status  11/12/2020 89 29 - 143 U/L Final  10/27/2020 494 (H) 29 - 143 U/L Final      ASSESSMENT AND PLAN  75 y.o. year old female here with:   Dx:  1. Myositis of upper arm, unspecified laterality, unspecified myositis type      PLAN:  Myalgia / myositis (non-specific; 1 time elevation of CK) - CK spontaneously improved; repeat labs; may consider EMG in future if CK elevated again or symptoms worsen  Orders Placed This Encounter  Procedures   CK   Aldolase    Return for pending if symptoms worsen or fail to improve, pending test results.    Penni Bombard, MD 85/0/2774, 1:28 PM Certified in Neurology, Neurophysiology and Neuroimaging  [facility] [address] [place], [place] [address] [phone]

## 2021-04-23 LAB — ALDOLASE: Aldolase: 5.7 U/L (ref 3.3–10.3)

## 2021-04-23 LAB — CK: Total CK: 148 U/L (ref 32–182)

## 2021-05-28 DIAGNOSIS — Z961 Presence of intraocular lens: Secondary | ICD-10-CM | POA: Diagnosis not present

## 2021-05-28 DIAGNOSIS — H524 Presbyopia: Secondary | ICD-10-CM | POA: Diagnosis not present

## 2021-08-07 DIAGNOSIS — J9801 Acute bronchospasm: Secondary | ICD-10-CM | POA: Diagnosis not present

## 2021-08-07 DIAGNOSIS — E6609 Other obesity due to excess calories: Secondary | ICD-10-CM | POA: Diagnosis not present

## 2021-08-07 DIAGNOSIS — J329 Chronic sinusitis, unspecified: Secondary | ICD-10-CM | POA: Diagnosis not present

## 2021-08-07 DIAGNOSIS — M1991 Primary osteoarthritis, unspecified site: Secondary | ICD-10-CM | POA: Diagnosis not present

## 2021-08-07 DIAGNOSIS — Z6836 Body mass index (BMI) 36.0-36.9, adult: Secondary | ICD-10-CM | POA: Diagnosis not present

## 2021-08-07 DIAGNOSIS — M47812 Spondylosis without myelopathy or radiculopathy, cervical region: Secondary | ICD-10-CM | POA: Diagnosis not present

## 2021-08-24 ENCOUNTER — Encounter: Payer: Self-pay | Admitting: Cardiology

## 2021-08-24 ENCOUNTER — Ambulatory Visit: Payer: Medicare HMO | Admitting: Cardiology

## 2021-08-24 ENCOUNTER — Encounter: Payer: Self-pay | Admitting: *Deleted

## 2021-08-24 VITALS — BP 124/70 | HR 62 | Ht 62.0 in | Wt 214.4 lb

## 2021-08-24 DIAGNOSIS — I10 Essential (primary) hypertension: Secondary | ICD-10-CM

## 2021-08-24 DIAGNOSIS — E782 Mixed hyperlipidemia: Secondary | ICD-10-CM | POA: Diagnosis not present

## 2021-08-24 DIAGNOSIS — I25118 Atherosclerotic heart disease of native coronary artery with other forms of angina pectoris: Secondary | ICD-10-CM | POA: Diagnosis not present

## 2021-08-24 NOTE — Patient Instructions (Signed)
Medication Instructions:  Continue all current medications.   Labwork: none  Testing/Procedures: none  Follow-Up: 6 months   Any Other Special Instructions Will Be Listed Below (If Applicable).   If you need a refill on your cardiac medications before your next appointment, please call your pharmacy.  

## 2021-08-24 NOTE — Progress Notes (Signed)
Clinical Summary Gloria Mccoy is a 76 y.o.female seen today for follow up of the following medical problems.    1. CAD - admit Jan 2019 with unstable angina. - Jan 2019 with distal LM 80%, prox LAD 90%. She was referred for CABG/ s/p LIMA-LAD, SVG-D1, SVG-RI - Jan 2019 echo LVEF 55-60%   - 10/2017 admitted with chest pain. Admitted with NSTEMI - 10/2017 cath with ostial LAD 45%, mid LAD 80%, D1 40%, ramus 20%, LCX 25%, RCA patent. LIMA-LAD patent, SVG-ramus occluded, SVG-D1 99%. Recs for medical therapy, discharged on DAPT with plans for 1 year of treatment.        - no regular exertional chest pains. Takes prn NG 1-2 times per month, which is her chronic pattern - compliant with med     2. Hyperlipidemia  - compliant with statin - labs followed by pcp   3. HTN He is compliant with meds     4. Breast cancer - s/p lumpectomy -she is on chemoradiation Past Medical History:  Diagnosis Date   Arthritis    "hands sometimes" (07/13/2017)   Coronary artery disease    a. s/p CABG x3 in 06/2017 with LIMA-LAD, SVG-D1, and SVG-RI.  b. 10/2017: cath showing a widely patent LIMA-LAD with ostial occlusion of the SVG-RI and subtotally occluded atretic SVG-D1. Graft occlusion thought to be 2ry to improvement in pre-CABG stenoses.    Essential hypertension    Family history of adverse reaction to anesthesia    sister had a complication that was stated she had a "foggy" episode after her breast surgery was readmitted 1 day post op after being discharged from the hospital. Sister also has significant lung problems that could have contributed to this    Family history of breast cancer    Family history of colon cancer    Hypothyroid    Meniere disease    Myocardial infarction ([place]) 06/2017   during cardaic rehab   Obesity    Osteopenia 01/2012   T score -1.3 FRAX 7.9%/0.6%     Allergies  Allergen Reactions   Aloe Vera Itching    Per patient severe itching   Sulfa Antibiotics  Shortness Of Breath   Sulfasalazine Shortness Of Breath     Current Outpatient Medications  Medication Sig Dispense Refill   acetaminophen (TYLENOL) 325 MG tablet Take 2 tablets (650 mg total) by mouth every 6 (six) hours as needed for mild pain.     aspirin EC 81 MG tablet Take 81 mg by mouth daily.     atorvastatin (LIPITOR) 80 MG tablet TAKE 1 TABLET BY MOUTH ONCE DAILY AT  6PM 90 tablet 3   cetaphil (CETAPHIL) lotion Apply 1 application topically as needed for dry skin. 236 mL 0   Cholecalciferol (VITAMIN D PO) Take 1 tablet by mouth daily.      Cyanocobalamin (VITAMIN B-12 PO) Take 1 tablet by mouth daily.     fish oil-omega-3 fatty acids 1000 MG capsule Take 1 g by mouth daily.     levothyroxine (SYNTHROID, LEVOTHROID) 75 MCG tablet Take 75 mcg by mouth daily.     losartan (COZAAR) 25 MG tablet TAKE 1/2 TABLET EVERY DAY 45 tablet 3   metoprolol tartrate (LOPRESSOR) 25 MG tablet TAKE 1 TABLET TWICE DAILY 180 tablet 3   nitroGLYCERIN (NITROSTAT) 0.4 MG SL tablet PLACE 1 TABLET UNDER THE TONGUE EVERY 5 (FIVE) MINUTES AS DIRECTED AS NEEDED FOR CHEST PAIN. 25 tablet 3   vitamin E 100  UNIT capsule Take 1 capsule (100 Units total) by mouth daily. 30 capsule 0   No current facility-administered medications for this visit.     Past Surgical History:  Procedure Laterality Date   BREAST LUMPECTOMY WITH RADIOACTIVE SEED AND SENTINEL LYMPH NODE BIOPSY Left 09/01/2018   Procedure: LEFT BREAST LUMPECTOMY WITH RADIOACTIVE SEED AND SENTINEL LYMPH NODE BIOPSY;  Surgeon: Jovita Kussmaul, MD;  Location: [place];  Service: General;  Laterality: Left;   BREAST SURGERY     CARDIAC CATHETERIZATION  07/13/2017   CATARACT EXTRACTION W/PHACO Right 01/28/2015   Procedure: CATARACT EXTRACTION PHACO AND INTRAOCULAR LENS PLACEMENT :  CDE:  5.70;  Surgeon: Rutherford Guys, MD;  [facility];  Service: Ophthalmology;  Laterality: Right;   CATARACT EXTRACTION W/PHACO Left 02/11/2015   Procedure: CATARACT EXTRACTION  PHACO AND INTRAOCULAR LENS PLACEMENT (IOC);  Surgeon: Rutherford Guys, MD;  [facility];  Service: Ophthalmology;  Laterality: Left;  CDE: 7.38   COLONOSCOPY N/A 09/26/2012   Procedure: COLONOSCOPY;  Surgeon: Jamesetta So, MD;  [facility];  Service: Gastroenterology;  Laterality: N/A;   CORONARY ARTERY BYPASS GRAFT N/A 07/15/2017   Procedure: CORONARY ARTERY BYPASS GRAFTING (CABG) X 3 USING LEFT INTERNAL MAMMARY ARTERY AND RIGHT SAPHENOUS VEIN- ENDOSCOPICALLY HARVESTED;  Surgeon: Melrose Nakayama, MD;  Location: [facility];  Service: Open Heart Surgery;  Laterality: N/A;   IR IMAGING GUIDED PORT INSERTION  12/29/2018   IR REMOVAL TUN ACCESS W/ PORT W/O FL MOD SED  04/26/2019   LEFT HEART CATH AND CORONARY ANGIOGRAPHY N/A 07/13/2017   Procedure: LEFT HEART CATH AND CORONARY ANGIOGRAPHY;  Surgeon: Martinique, Peter M, MD;  Location: [facility];  Service: Cardiovascular;  Laterality: N/A;   LEFT HEART CATH AND CORS/GRAFTS ANGIOGRAPHY N/A 11/03/2017   Procedure: LEFT HEART CATH AND CORS/GRAFTS ANGIOGRAPHY;  Surgeon: Troy Sine, MD;  Location: [place] [facility];  Service: Cardiovascular;  Laterality: N/A;   TEE WITHOUT CARDIOVERSION N/A 07/15/2017   Procedure: TRANSESOPHAGEAL ECHOCARDIOGRAM (TEE);  Surgeon: Melrose Nakayama, MD;  Location: [facility];  Service: Open Heart Surgery;  Laterality: N/A;   TUBAL LIGATION     TYMPANOPLASTY Left    fluid from ear drum     Allergies  Allergen Reactions   Aloe Vera Itching    Per patient severe itching   Sulfa Antibiotics Shortness Of Breath   Sulfasalazine Shortness Of Breath      Family History  Problem Relation Age of Onset   Diabetes Sister        AODM   Heart disease Sister 40       CABG   Breast cancer Sister    Heart disease Brother 25       In his 35s   Colon cancer Maternal Uncle 24   Diabetes Sister        AODM   Hypertension Daughter    Hypertension Daughter    Heart attack Father        In his 16s   Stroke  Mother    Stroke Maternal Grandmother    Diabetes Maternal Grandfather        d. 109   Heart attack Paternal Grandmother    Colon cancer Maternal Uncle 63   Breast cancer Cousin        dx 2s; d. 50s   Breast cancer Niece 72     Social History Gloria Mccoy reports that she has never smoked. She quit smokeless tobacco use about  4 years ago.  Her smokeless tobacco use included snuff. Gloria Mccoy reports that she does not currently use alcohol after a past usage of about 1.0 standard drink per week.   Review of Systems CONSTITUTIONAL: No weight loss, fever, chills, weakness or fatigue.  HEENT: Eyes: No visual loss, blurred vision, double vision or yellow sclerae.No hearing loss, sneezing, congestion, runny nose or sore throat.  SKIN: No rash or itching.  CARDIOVASCULAR: per hpi RESPIRATORY: No shortness of breath, cough or sputum.  GASTROINTESTINAL: No anorexia, nausea, vomiting or diarrhea. No abdominal pain or blood.  GENITOURINARY: No burning on urination, no polyuria NEUROLOGICAL: No headache, dizziness, syncope, paralysis, ataxia, numbness or tingling in the extremities. No change in bowel or bladder control.  MUSCULOSKELETAL: No muscle, back pain, joint pain or stiffness.  LYMPHATICS: No enlarged nodes. No history of splenectomy.  PSYCHIATRIC: No history of depression or anxiety.  ENDOCRINOLOGIC: No reports of sweating, cold or heat intolerance. No polyuria or polydipsia.  Marland Kitchen   Physical Examination Today's Vitals   08/24/21 0805  BP: 124/70  Pulse: 62  SpO2: 94%  Weight: 214 lb 6.4 oz (97.3 kg)  Height: '5\' 2"'$  (1.575 m)   Body mass index is 39.21 kg/m.  Gen: resting comfortably, no acute distress HEENT: no scleral icterus, pupils equal round and reactive, no palptable cervical adenopathy,  CV: RRR, no m/r/g no jvd Resp: Clear to auscultation bilaterally GI: abdomen is soft, non-tender, non-distended, normal bowel sounds, no hepatosplenomegaly MSK: extremities are warm,  no edema.  Skin: warm, no rash Neuro:  no focal deficits Psych: appropriate affect   Diagnostic Studies  Jan 2019 cath Mid LM lesion is 25% stenosed. Dist LM to Ost LAD lesion is 80% stenosed. Ost Cx to Prox Cx lesion is 30% stenosed. Ost 1st Mrg lesion is 50% stenosed. Prox LAD lesion is 90% stenosed. The left ventricular systolic function is normal. LV end diastolic pressure is normal. The left ventricular ejection fraction is 55-65% by visual estimate.   1. Single vessel obstructive CAD. Patient has complex ostial and proximal to mid LAD disease that involves the origin of the ramus intermediate and first diagonal respectively. 2. Normal LV function 3. Normal LVEDP   Plan: given complexity of lesions with ostial and bifurcation LAD disease I would recommend consideration for CABG.   Jan 2019 Carotid US Final Interpretation: Right Carotid: Velocities in the right ICA are consistent with a 1-39% stenosis.  Left Carotid: Velocities in the left ICA are consistent with a 1-39% stenosis. Vertebrals:  Both vertebral arteries were patent with antegrade flow. Subclavians:   Jan 2019 echo Study Conclusions   - Left ventricle: The cavity size was normal. Wall thickness was   increased in a pattern of mild LVH. Systolic function was normal.   The estimated ejection fraction was in the range of 55% to 60%.   Wall motion was normal; there were no regional wall motion   abnormalities. Doppler parameters are consistent with abnormal   left ventricular relaxation (grade 1 diastolic dysfunction). - Mitral valve: Valve area by pressure half-time: 1.26 cm^2.   Impressions:   - Normal LV systolic function; mild LVH; mild diastolic   dysfunction.   10/2017 cath Ost 1st Diag to 1st Diag lesion is 40% stenosed. Ost Cx lesion is 25% stenosed. Ost Ramus lesion is 20% stenosed. Ost LAD lesion is 45% stenosed. Prox LAD to Mid LAD lesion is 80% stenosed. Origin lesion is 100%  stenosed. Origin lesion is 99% stenosed. Origin to  Dist Graft lesion is 99% stenosed. Dist Graft lesion is 100% stenosed. LV end diastolic pressure is mildly elevated. The left ventricular systolic function is normal.   Hyperdynamic LV function with an EF of 65% without focal segmental wall motion abnormalities.   Native coronary obstructive disease with 40% ostial LAD stenosis, diffuse 40 to 50% proximal diagonal stenosis with 80% LAD stenosis diffusely after the diagonal takeoff and evidence for competitive filling to the mid LAD via the LIMA graft; 25% ostial smooth narrowing in the ramus intermediate vessel; 25% ostial smooth narrowing in the left circumflex vessel; normal RCA.   Widely patent LIMA graft which supplies the mid LAD.   Totally ostial occlusion of the SVG which had supplied the ramus intermediate vessel.   Subtotally occluded ostial vein graft with diffuse 99% narrowing insistent with an atretic graft  which does not fill the diagonal vessel.   POST CATH RECOMMENDATION: Medical therapy.  I suspect the early vein graft occlusion was contributed by improvement in the prior pre-CABG stenoses.     Assessment and Plan  1. CAD with chronic stable angina -chronic mild infrequent chest pain and SL NG use that is stable. We discussed possbly adding imdur but she reports symptoms so infrequent would not favor a change a this time - continue current meds   2. Hyperlipidemia - continue atorvastatin, request labs from pcp   3. HTN - bp at goal, continue current meds        Arnoldo Lenis, M.D.

## 2021-08-25 ENCOUNTER — Encounter: Payer: Self-pay | Admitting: *Deleted

## 2021-08-26 ENCOUNTER — Telehealth: Payer: Self-pay | Admitting: Adult Health

## 2021-08-26 NOTE — Telephone Encounter (Signed)
Called patient to update her on the changes made to her upcoming appointment. Talked with the patients daughter Tamela Oddi), and she is aware of these changes.  ?

## 2021-09-23 ENCOUNTER — Other Ambulatory Visit: Payer: Self-pay

## 2021-09-23 DIAGNOSIS — C50512 Malignant neoplasm of lower-outer quadrant of left female breast: Secondary | ICD-10-CM

## 2021-09-24 ENCOUNTER — Inpatient Hospital Stay (HOSPITAL_BASED_OUTPATIENT_CLINIC_OR_DEPARTMENT_OTHER): Payer: Medicare HMO | Admitting: Adult Health

## 2021-09-24 ENCOUNTER — Other Ambulatory Visit: Payer: Self-pay

## 2021-09-24 ENCOUNTER — Encounter: Payer: Self-pay | Admitting: Adult Health

## 2021-09-24 ENCOUNTER — Other Ambulatory Visit: Payer: Medicare HMO

## 2021-09-24 ENCOUNTER — Inpatient Hospital Stay: Payer: Medicare HMO | Attending: Adult Health

## 2021-09-24 ENCOUNTER — Encounter: Payer: Medicare HMO | Admitting: Adult Health

## 2021-09-24 ENCOUNTER — Inpatient Hospital Stay: Payer: Medicare HMO | Admitting: Adult Health

## 2021-09-24 DIAGNOSIS — C50512 Malignant neoplasm of lower-outer quadrant of left female breast: Secondary | ICD-10-CM

## 2021-09-24 DIAGNOSIS — Z803 Family history of malignant neoplasm of breast: Secondary | ICD-10-CM | POA: Diagnosis not present

## 2021-09-24 DIAGNOSIS — Z171 Estrogen receptor negative status [ER-]: Secondary | ICD-10-CM

## 2021-09-24 DIAGNOSIS — Z9221 Personal history of antineoplastic chemotherapy: Secondary | ICD-10-CM | POA: Insufficient documentation

## 2021-09-24 DIAGNOSIS — Z87891 Personal history of nicotine dependence: Secondary | ICD-10-CM | POA: Diagnosis not present

## 2021-09-24 DIAGNOSIS — H8109 Meniere's disease, unspecified ear: Secondary | ICD-10-CM | POA: Diagnosis not present

## 2021-09-24 DIAGNOSIS — I10 Essential (primary) hypertension: Secondary | ICD-10-CM | POA: Insufficient documentation

## 2021-09-24 DIAGNOSIS — I252 Old myocardial infarction: Secondary | ICD-10-CM | POA: Insufficient documentation

## 2021-09-24 DIAGNOSIS — Z833 Family history of diabetes mellitus: Secondary | ICD-10-CM | POA: Diagnosis not present

## 2021-09-24 DIAGNOSIS — Z800 Family history of malignant neoplasm of digestive organs: Secondary | ICD-10-CM | POA: Diagnosis not present

## 2021-09-24 DIAGNOSIS — Z823 Family history of stroke: Secondary | ICD-10-CM | POA: Insufficient documentation

## 2021-09-24 DIAGNOSIS — Z79899 Other long term (current) drug therapy: Secondary | ICD-10-CM | POA: Diagnosis not present

## 2021-09-24 DIAGNOSIS — Z923 Personal history of irradiation: Secondary | ICD-10-CM | POA: Diagnosis not present

## 2021-09-24 DIAGNOSIS — Z8249 Family history of ischemic heart disease and other diseases of the circulatory system: Secondary | ICD-10-CM | POA: Diagnosis not present

## 2021-09-24 DIAGNOSIS — I25110 Atherosclerotic heart disease of native coronary artery with unstable angina pectoris: Secondary | ICD-10-CM | POA: Diagnosis not present

## 2021-09-24 LAB — CBC WITH DIFFERENTIAL (CANCER CENTER ONLY)
Abs Immature Granulocytes: 0.01 10*3/uL (ref 0.00–0.07)
Basophils Absolute: 0.1 10*3/uL (ref 0.0–0.1)
Basophils Relative: 1 %
Eosinophils Absolute: 0.3 10*3/uL (ref 0.0–0.5)
Eosinophils Relative: 4 %
HCT: 38.6 % (ref 36.0–46.0)
Hemoglobin: 13.9 g/dL (ref 12.0–15.0)
Immature Granulocytes: 0 %
Lymphocytes Relative: 34 %
Lymphs Abs: 2.7 10*3/uL (ref 0.7–4.0)
MCH: 29.8 pg (ref 26.0–34.0)
MCHC: 36 g/dL (ref 30.0–36.0)
MCV: 82.8 fL (ref 80.0–100.0)
Monocytes Absolute: 1.2 10*3/uL — ABNORMAL HIGH (ref 0.1–1.0)
Monocytes Relative: 15 %
Neutro Abs: 3.6 10*3/uL (ref 1.7–7.7)
Neutrophils Relative %: 46 %
Platelet Count: 260 10*3/uL (ref 150–400)
RBC: 4.66 MIL/uL (ref 3.87–5.11)
RDW: 13.7 % (ref 11.5–15.5)
WBC Count: 7.9 10*3/uL (ref 4.0–10.5)
nRBC: 0 % (ref 0.0–0.2)

## 2021-09-24 LAB — CMP (CANCER CENTER ONLY)
ALT: 13 U/L (ref 0–44)
AST: 16 U/L (ref 15–41)
Albumin: 3.8 g/dL (ref 3.5–5.0)
Alkaline Phosphatase: 71 U/L (ref 38–126)
Anion gap: 6 (ref 5–15)
BUN: 14 mg/dL (ref 8–23)
CO2: 30 mmol/L (ref 22–32)
Calcium: 9 mg/dL (ref 8.9–10.3)
Chloride: 104 mmol/L (ref 98–111)
Creatinine: 0.8 mg/dL (ref 0.44–1.00)
GFR, Estimated: >60 mL/min (ref >60)
Glucose, Bld: 119 mg/dL — ABNORMAL HIGH (ref 70–99)
Potassium: 3.7 mmol/L (ref 3.5–5.1)
Sodium: 140 mmol/L (ref 135–145)
Total Bilirubin: 0.8 mg/dL (ref 0.3–1.2)
Total Protein: 7.5 g/dL (ref 6.5–8.1)

## 2021-09-24 NOTE — Progress Notes (Signed)
[facility] Follow up: ?  ? ?Redmond School, MD ?[address] ?[place] [address] ? ? ?DIAGNOSIS:  Cancer Staging  ?Malignant neoplasm of lower-outer quadrant of left breast of female, estrogen receptor negative ([place]) ?Staging form: Breast, AJCC 8th Edition ?- Clinical: Stage IB (cT1b, cN0(sn), cM0, G2, ER-, PR-, HER2-) - Unsigned ?Method of lymph node assessment: Sentinel lymph node biopsy ?Histologic grading system: 3 grade system ?Laterality: Left ?Tumor size (mm): 7 ?- Pathologic: Stage IB (pT1c, pN0, cM0, G2, ER-, PR-, HER2-) - Signed by Gardenia Phlegm, NP on 09/13/2018 ?Neoadjuvant therapy: No ?Histologic grading system: 3 grade system ? ? ?SUMMARY OF ONCOLOGIC HISTORY: ?Oncology History  ?Malignant neoplasm of lower-outer quadrant of left breast of female, estrogen receptor negative ([place])  ?08/01/2018 Initial Diagnosis  ? Screening mammography revealed an abnormality in the left breast. Diagnostic testing revealed a 0.7 cm mass at 7 o'clock, 7 cm from the nipple. No lymphadenopathy seen in the left axilla. Biopsy ([id number]) revealed: IDC, grade II; ER 0%, PR 0%, Ki67 40%, HER2 negative (1+). Clinical T1c N0. ?  ?08/23/2018 Genetic Testing  ? MLH1 c.1890T>G VUS identified on the common hereditary cancer panel.  The Hereditary Gene Panel offered by [facility] includes sequencing and/or deletion duplication testing of the following 47 genes: APC, ATM, AXIN2, BARD1, BMPR1A, BRCA1, BRCA2, BRIP1, CDH1, CDK4, CDKN2A (p14ARF), CDKN2A (p16INK4a), CHEK2, CTNNA1, DICER1, EPCAM (Deletion/duplication testing only), GREM1 (promoter region deletion/duplication testing only), KIT, MEN1, MLH1, MSH2, MSH3, MSH6, MUTYH, NBN, NF1, NHTL1, PALB2, PDGFRA, PMS2, POLD1, POLE, PTEN, RAD50, RAD51C, RAD51D, SDHB, SDHC, SDHD, SMAD4, SMARCA4. STK11, TP53, TSC1, TSC2, and VHL.  The following genes were evaluated for sequence changes only: SDHA and HOXB13 c.251G>A variant only. The report date is  08-29-2018. ?  ?08/24/2018 Breast MRI  ? Bilateral breast MRI with and without contrast.  ?Right breast: No suspicious mass or abnormal enhancement. Benign intramammary lymph nodes in the outer right breast. ?  ?Left breast: In the posterior third of the lower inner quadrant of the left breast is an irregular enhancing mass spanning approximately 1.3 x 0.8 x 0.5 cm with internal biopsy clip artifact and washout kinetics, corresponding to the   biopsy-proven malignancy. The mass/enhancement do not involve the pectoralis muscle. Benign intramammary lymph node in the middle third of the outer left breast is mammographically stable. There is more glandular tissue in the left breast on the right, corresponding with chronic appearance of mammograms dating back to at least 2010. ? ?Lymph nodes: No abnormal appearing lymph nodes. ?  ?09/01/2018 Surgery  ? Left lumpectomy Marlou Starks) [phone]): IDC, nottingham grade 2 of 3, 1.1 cm. Margins uninvolved. 0/3 lymph nodes positive for carcinoma. Pathologic stage pT1c pN0. ?  ?09/13/2018 Cancer Staging  ? Staging form: Breast, AJCC 8th Edition ?- Pathologic: Stage IB (pT1c, pN0, cM0, G2, ER-, PR-, HER2-) - Signed by Gardenia Phlegm, NP on 09/13/2018 ? ?  ?10/24/2018 - 04/05/2019 Chemotherapy  ? Patient is on Treatment Plan : BREAST Adjuvant CMF IV q21d  ?   ?12/05/2018 - 01/19/2019 Radiation Therapy  ? [facility]) The patient initially received a dose of 50.4 Gy in 28 fractions to the breast using whole-breast tangent fields. This was delivered using a 3-D conformal technique. The patient then received a boost to the seroma. This delivered an additional 10 Gy in 5 fractions using a 3 field photon boost technique. The total dose was 60.4 Gy. ?  ? ? ?CURRENT THERAPY: Observation ? ?INTERVAL HISTORY: ?Gloria Paige  Mccoy 76 y.o. female returns for follow-up of her triple negative breast cancer.  She tells me that she is doing well today.  She has no significant concerns today. ? ?Her most  recent mammogram was in February 2022.  She did not realize that it was overdue.  She tells me she plans to get this scheduled.  She is active throughout the day, recently her sister has become terminally ill and she is spending the days with her in the nursing home. ? ? ?Patient Active Problem List  ? Diagnosis Date Noted  ? Port-A-Cath in place 01/16/2019  ? Genetic testing 08/29/2018  ? Family history of breast cancer   ? Family history of colon cancer   ? Malignant neoplasm of lower-outer quadrant of left breast of female, estrogen receptor negative ([place]) 08/16/2018  ? NSTEMI (non-ST elevated myocardial infarction) ([place]) 11/02/2017  ? S/P CABG x 3 07/15/2017  ? Coronary artery disease 07/15/2017  ? Unstable angina ([place]) 07/13/2017  ? Essential hypertension 07/13/2017  ? Obesity 07/13/2017  ? Hyperglycemia 07/13/2017  ? Hypothyroid 01/06/2012  ? Meniere disease   ? Arthritis   ? ? ?is allergic to aloe vera, sulfa antibiotics, and sulfasalazine. ? ?MEDICAL HISTORY: ?Past Medical History:  ?Diagnosis Date  ? Arthritis   ? "hands sometimes" (07/13/2017)  ? Coronary artery disease   ? a. s/p CABG x3 in 06/2017 with LIMA-LAD, SVG-D1, and SVG-RI.  b. 10/2017: cath showing a widely patent LIMA-LAD with ostial occlusion of the SVG-RI and subtotally occluded atretic SVG-D1. Graft occlusion thought to be 2ry to improvement in pre-CABG stenoses.   ? Essential hypertension   ? Family history of adverse reaction to anesthesia   ? sister had a complication that was stated she had a "foggy" episode after her breast surgery was readmitted 1 day post op after being discharged from the hospital. Sister also has significant lung problems that could have contributed to this   ? Family history of breast cancer   ? Family history of colon cancer   ? Hypothyroid   ? Meniere disease   ? Myocardial infarction ([place]) 06/2017  ? during cardaic rehab  ? Obesity   ? Osteopenia 01/2012  ? T score -1.3 FRAX 7.9%/0.6%  ? ? ?SURGICAL  HISTORY: ?Past Surgical History:  ?Procedure Laterality Date  ? BREAST LUMPECTOMY WITH RADIOACTIVE SEED AND SENTINEL LYMPH NODE BIOPSY Left 09/01/2018  ? Procedure: LEFT BREAST LUMPECTOMY WITH RADIOACTIVE SEED AND SENTINEL LYMPH NODE BIOPSY;  Surgeon: Jovita Kussmaul, MD;  Location: [facility];  Service: General;  Laterality: Left;  ? BREAST SURGERY    ? CARDIAC CATHETERIZATION  07/13/2017  ? CATARACT EXTRACTION W/PHACO Right 01/28/2015  ? Procedure: CATARACT EXTRACTION PHACO AND INTRAOCULAR LENS PLACEMENT :  CDE:  5.70;  Surgeon: Rutherford Guys, MD;  [facility];  Service: Ophthalmology;  Laterality: Right;  ? CATARACT EXTRACTION W/PHACO Left 02/11/2015  ? Procedure: CATARACT EXTRACTION PHACO AND INTRAOCULAR LENS PLACEMENT (IOC);  Surgeon: Rutherford Guys, MD;  Location: [facility];  Service: Ophthalmology;  Laterality: Left;  CDE: 7.38  ? COLONOSCOPY N/A 09/26/2012  ? Procedure: COLONOSCOPY;  Surgeon: Jamesetta So, MD;  [facility];  Service: Gastroenterology;  Laterality: N/A;  ? CORONARY ARTERY BYPASS GRAFT N/A 07/15/2017  ? Procedure: CORONARY ARTERY BYPASS GRAFTING (CABG) X 3 USING LEFT INTERNAL MAMMARY ARTERY AND RIGHT SAPHENOUS VEIN- ENDOSCOPICALLY HARVESTED;  Surgeon: Melrose Nakayama, MD;  Location: [facility];  Service: Open Heart Surgery;  Laterality: N/A;  ?  IR IMAGING GUIDED PORT INSERTION  12/29/2018  ? IR REMOVAL TUN ACCESS W/ PORT W/O FL MOD SED  04/26/2019  ? LEFT HEART CATH AND CORONARY ANGIOGRAPHY N/A 07/13/2017  ? Procedure: LEFT HEART CATH AND CORONARY ANGIOGRAPHY;  Surgeon: Martinique, Peter M, MD;  Location: [facility];  Service: Cardiovascular;  Laterality: N/A;  ? LEFT HEART CATH AND CORS/GRAFTS ANGIOGRAPHY N/A 11/03/2017  ? Procedure: LEFT HEART CATH AND CORS/GRAFTS ANGIOGRAPHY;  Surgeon: Troy Sine, MD;  Location: [facility];  Service: Cardiovascular;  Laterality: N/A;  ? TEE WITHOUT CARDIOVERSION N/A 07/15/2017  ? Procedure: TRANSESOPHAGEAL ECHOCARDIOGRAM (TEE);  Surgeon:  Melrose Nakayama, MD;  Location: [facility];  Service: Open Heart Surgery;  Laterality: N/A;  ? TUBAL LIGATION    ? TYMPANOPLASTY Left   ? fluid from ear drum  ? ? ?SOCIAL HISTORY: ?Social History  ? ?Socioeconomic History  ? [place]

## 2021-09-24 NOTE — Assessment & Plan Note (Signed)
Margarette is a 76 year old woman with history of stage Ib triple negative breast cancer status postlumpectomy, adjuvant chemotherapy, and adjuvant radiation which completed in July 2020. ? ?1.  Stage Ib triple negative breast cancer: Marionna has no clinical or radiographic signs of breast cancer recurrence.  She will continue under observation every 6 months for 5 years of her follow-up.  We discussed signs and symptoms of recurrence in detail and she will let us know if she has any concerns between now and her next appointment. ? ?2.  Health maintenance: I recommended continued healthy diet and exercise.  She is up-to-date with her health maintenance with seeing her primary care and will continue to follow-up on this. ? ?I will see Darlyne back in 6 months for labs and follow-up. ?

## 2021-09-24 NOTE — Progress Notes (Deleted)
?[facility]  ?[phone] [phone]  ? ? ?ID: Gloria Mccoy DOB: 12-Nov-1945  MR#: [id number]  JYN#:[id number] ? ?Patient Care Team: ?Redmond School, MD as PCP - General (Internal Medicine) ?Arnoldo Lenis, MD as PCP - Cardiology (Cardiology) ?Rutherford Guys, MD as Consulting Physician (Ophthalmology) ?Jovita Kussmaul, MD as Consulting Physician (General Surgery) ?Bo Merino, MD as Consulting Physician (Rheumatology) ?Gardenia Phlegm, NP as Nurse Practitioner (Hematology and Oncology) ?OTHER MD:  ? ?CHIEF COMPLAINT: Triple negative breast cancer ? ?CURRENT TREATMENT: observation ? ? ?INTERVAL HISTORY: ?Talecia is here today for follow up of her triple negative breast cancer. She continues under observation.   ? ?Since her last visit, she underwent bilateral diagnostic mammography with tomography at [facility] on 08/05/2020 showing: breast density category B; no evidence of malignancy in either breast.  ? ? ?REVIEW OF SYSTEMS: ?Review of Systems - Oncology ? ? COVID 19 VACCINATION STATUS: Status post Moderna x2, followed by booster ? ? ?HISTORY OF CURRENT ILLNESS: ?From the original intake note: ? ?Gloria Mccoy had routine screening mammography on 07/12/2018 showing a possible abnormality in the left breast. She underwent unilateral left diagnostic mammography with tomography and left breast ultrasonography at [facility] on 07/25/2018 showing: Breast Density Category B. There is a mass with indistinct margins in the lower inner posterior left breast measuring approximately 0.6 cm. Targeted ultrasound of the lower left breast was performed. There is a hypoechoic mass with margin irregularity in the left breast at 7 o'clock, 7 cm from the nipple measuring 0.7 x 0.5 x 0.6 cm. No lymphadenopathy seen in the left axilla.  ? ?Accordingly on 08/01/2018 she proceeded to biopsy of the left breast area in question. The pathology from this procedure showed  ([id number]): invasive ductal carcinoma, grade II. Prognostic indicators significant for: estrogen receptor, 0% negative and progesterone receptor, 0% negative. Proliferation marker Ki67 at 40%. HER2 negative (1+) by immunohistochemistry.  ? ?The patient's subsequent history is as detailed below. ? ? ?PAST MEDICAL HISTORY: ?Past Medical History:  ?Diagnosis Date  ?? Arthritis   ? "hands sometimes" (07/13/2017)  ?? Coronary artery disease   ? a. s/p CABG x3 in 06/2017 with LIMA-LAD, SVG-D1, and SVG-RI.  b. 10/2017: cath showing a widely patent LIMA-LAD with ostial occlusion of the SVG-RI and subtotally occluded atretic SVG-D1. Graft occlusion thought to be 2ry to improvement in pre-CABG stenoses.   ?? Essential hypertension   ?? Family history of adverse reaction to anesthesia   ? sister had a complication that was stated she had a "foggy" episode after her breast surgery was readmitted 1 day post op after being discharged from the hospital. Sister also has significant lung problems that could have contributed to this   ?? Family history of breast cancer   ?? Family history of colon cancer   ?? Hypothyroid   ?? Meniere disease   ?? Myocardial infarction ([place]) 06/2017  ? during cardaic rehab  ?? Obesity   ?? Osteopenia 01/2012  ? T score -1.3 FRAX 7.9%/0.6%  ? ? ?PAST SURGICAL HISTORY: ?Past Surgical History:  ?Procedure Laterality Date  ?? BREAST LUMPECTOMY WITH RADIOACTIVE SEED AND SENTINEL LYMPH NODE BIOPSY Left 09/01/2018  ? Procedure: LEFT BREAST LUMPECTOMY WITH RADIOACTIVE SEED AND SENTINEL LYMPH NODE BIOPSY;  Surgeon: Jovita Kussmaul, MD;  Location: [facility];  Service: General;  Laterality: Left;  ?? BREAST SURGERY    ?? CARDIAC CATHETERIZATION  07/13/2017  ?? CATARACT EXTRACTION W/PHACO Right 01/28/2015  ?  Procedure: CATARACT EXTRACTION PHACO AND INTRAOCULAR LENS PLACEMENT :  CDE:  5.70;  Surgeon: Rutherford Guys, MD;  [facility];  Service: Ophthalmology;  Laterality: Right;  ?? CATARACT EXTRACTION W/PHACO Left  02/11/2015  ? Procedure: CATARACT EXTRACTION PHACO AND INTRAOCULAR LENS PLACEMENT (IOC);  Surgeon: Rutherford Guys, MD;  [facility];  Service: Ophthalmology;  Laterality: Left;  CDE: 7.38  ?? COLONOSCOPY N/A 09/26/2012  ? Procedure: COLONOSCOPY;  Surgeon: Jamesetta So, MD;  [facility];  Service: Gastroenterology;  Laterality: N/A;  ?? CORONARY ARTERY BYPASS GRAFT N/A 07/15/2017  ? Procedure: CORONARY ARTERY BYPASS GRAFTING (CABG) X 3 USING LEFT INTERNAL MAMMARY ARTERY AND RIGHT SAPHENOUS VEIN- ENDOSCOPICALLY HARVESTED;  Surgeon: Melrose Nakayama, MD;  Location: [facility];  Service: Open Heart Surgery;  Laterality: N/A;  ?? IR IMAGING GUIDED PORT INSERTION  12/29/2018  ?? IR REMOVAL TUN ACCESS W/ PORT W/O FL MOD SED  04/26/2019  ?? LEFT HEART CATH AND CORONARY ANGIOGRAPHY N/A 07/13/2017  ? Procedure: LEFT HEART CATH AND CORONARY ANGIOGRAPHY;  Surgeon: Martinique, Peter M, MD;  Location: [facility];  Service: Cardiovascular;  Laterality: N/A;  ?? LEFT HEART CATH AND CORS/GRAFTS ANGIOGRAPHY N/A 11/03/2017  ? Procedure: LEFT HEART CATH AND CORS/GRAFTS ANGIOGRAPHY;  Surgeon: Troy Sine, MD;  Location: [facility];  Service: Cardiovascular;  Laterality: N/A;  ?? TEE WITHOUT CARDIOVERSION N/A 07/15/2017  ? Procedure: TRANSESOPHAGEAL ECHOCARDIOGRAM (TEE);  Surgeon: Melrose Nakayama, MD;  Location: [facility];  Service: Open Heart Surgery;  Laterality: N/A;  ?? TUBAL LIGATION    ?? TYMPANOPLASTY Left   ? fluid from ear drum  ? ? ?FAMILY HISTORY: ?Family History  ?Problem Relation Age of Onset  ?? Diabetes Sister   ?     AODM  ?? Heart disease Sister 42  ?     CABG  ?? Breast cancer Sister   ?? Heart disease Brother 24  ?     In his 28s  ?? Colon cancer Maternal Uncle 60  ?? Diabetes Sister   ?     AODM  ?? Hypertension Daughter   ?? Hypertension Daughter   ?? Heart attack Father   ?     In his 62s  ?? Stroke Mother   ?? Stroke Maternal Grandmother   ?? Diabetes Maternal Grandfather   ?     d. 109   ?? Heart attack Paternal Grandmother   ?? Colon cancer Maternal Uncle 77  ?? Breast cancer Cousin   ?     dx 85s; d. 107s  ?? Breast cancer Niece 81  ?Mariely's father died from a myocardial infarction at age 35. Patients' mother died from a stroke at age 59. The patient has 3 brothers and 9 sisters. One of Kieley's sisters, [name], was diagnosed with breast cancer at the age of 76. Erie also has a niece that was diagnosed with breast cancer at 83, and two cousins that were diagnosed with breast cancer.  She believes 1 of her brothers may have prostate cancer.  Patient denies anyone in her family having ovarian or pancreatic cancer. Aliany has an uncle that was diagnosed with colon cancer and an uncle that was diagnosed with "stomach" cancer.  ? ? ?GYNECOLOGIC HISTORY:  ?No LMP recorded. Patient is postmenopausal. ?Menarche: 76 years old ?Age at first live birth: 76 years old ?GX P: 2 ?LMP: at 76 years old ?Contraceptive:  ?HRT: yes, ~1 year  ?Hysterectomy?: no ?BSO?: no ? ? ?  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 Her husband, Jason Fila, is a retired Administrator. They have two daughters. Their daughter, [name], lives in [place] and is a housewife.  Their other daughter works at [employer]. Jimmie had one grandchild that is now deceased. She attends a [employer].  ? ? ADVANCED DIRECTIVES: Corretta's husband, Jason Fila, is automatically her healthcare power of attorney.   ? ? ?HEALTH MAINTENANCE: ?Social History  ? ?Tobacco Use  ?? Smoking status: Never  ?? Smokeless tobacco: Former  ?  Types: Snuff  ?  Quit date: 07/31/2017  ?Vaping Use  ?? Vaping Use: Never used  ?Substance Use Topics  ?? Alcohol use: Not Currently  ?  Alcohol/week: 1.0 standard drink  ?  Types: 1 Standard drinks or equivalent per week  ?  Comment: occasionally  ?? Drug use: No  ? ? Colonoscopy: yes, Dr. Arnoldo Morale ? PAP:  ? Bone density: yes, 2016, -1.1 osteopenic ?  ?Allergies  ?Allergen Reactions  ?? Aloe Vera Itching   ?  Per patient severe itching  ?? Sulfa Antibiotics Shortness Of Breath  ?? Sulfasalazine Shortness Of Breath  ? ? ?Current Outpatient Medications  ?Medication Sig Dispense Refill  ?? acetaminophen (TYLENO

## 2021-09-25 ENCOUNTER — Telehealth: Payer: Self-pay | Admitting: Hematology and Oncology

## 2021-09-25 NOTE — Telephone Encounter (Signed)
Scheduled appointment per 4/6 los. Left message. Patient will be mailed an updated calendar. ?

## 2021-09-25 NOTE — Progress Notes (Signed)
error 

## 2021-11-23 ENCOUNTER — Other Ambulatory Visit (HOSPITAL_COMMUNITY): Payer: Self-pay | Admitting: Internal Medicine

## 2021-11-23 DIAGNOSIS — Z1231 Encounter for screening mammogram for malignant neoplasm of breast: Secondary | ICD-10-CM

## 2021-11-30 ENCOUNTER — Other Ambulatory Visit: Payer: Self-pay | Admitting: Cardiology

## 2021-12-07 ENCOUNTER — Ambulatory Visit (HOSPITAL_COMMUNITY)
Admission: RE | Admit: 2021-12-07 | Discharge: 2021-12-07 | Disposition: A | Discharge status: Home / Self Care | Payer: Medicare HMO | Source: Ambulatory Visit | Attending: Internal Medicine | Admitting: Internal Medicine

## 2021-12-07 DIAGNOSIS — Z1231 Encounter for screening mammogram for malignant neoplasm of breast: Secondary | ICD-10-CM | POA: Insufficient documentation

## 2021-12-14 ENCOUNTER — Other Ambulatory Visit: Payer: Self-pay | Admitting: Cardiology

## 2022-02-12 DIAGNOSIS — E6609 Other obesity due to excess calories: Secondary | ICD-10-CM | POA: Diagnosis not present

## 2022-02-12 DIAGNOSIS — M47812 Spondylosis without myelopathy or radiculopathy, cervical region: Secondary | ICD-10-CM | POA: Diagnosis not present

## 2022-02-12 DIAGNOSIS — Z6835 Body mass index (BMI) 35.0-35.9, adult: Secondary | ICD-10-CM | POA: Diagnosis not present

## 2022-02-12 DIAGNOSIS — E039 Hypothyroidism, unspecified: Secondary | ICD-10-CM | POA: Diagnosis not present

## 2022-02-12 DIAGNOSIS — M1991 Primary osteoarthritis, unspecified site: Secondary | ICD-10-CM | POA: Diagnosis not present

## 2022-02-12 DIAGNOSIS — I10 Essential (primary) hypertension: Secondary | ICD-10-CM | POA: Diagnosis not present

## 2022-02-12 DIAGNOSIS — D518 Other vitamin B12 deficiency anemias: Secondary | ICD-10-CM | POA: Diagnosis not present

## 2022-02-12 DIAGNOSIS — Z1331 Encounter for screening for depression: Secondary | ICD-10-CM | POA: Diagnosis not present

## 2022-02-12 DIAGNOSIS — E538 Deficiency of other specified B group vitamins: Secondary | ICD-10-CM | POA: Diagnosis not present

## 2022-02-12 DIAGNOSIS — Z0001 Encounter for general adult medical examination with abnormal findings: Secondary | ICD-10-CM | POA: Diagnosis not present

## 2022-02-12 DIAGNOSIS — E559 Vitamin D deficiency, unspecified: Secondary | ICD-10-CM | POA: Diagnosis not present

## 2022-02-23 ENCOUNTER — Telehealth: Payer: Self-pay | Admitting: *Deleted

## 2022-02-23 NOTE — Patient Outreach (Signed)
  Care Coordination   02/23/2022 Name: Gloria Mccoy MRN: [id number] DOB: [birth year]   Care Coordination Outreach Attempts:  An unsuccessful telephone outreach was attempted today to offer the patient information about available care coordination services as a benefit of their health plan.   Follow Up Plan:  Additional outreach attempts will be made to offer the patient care coordination information and services.   Encounter Outcome:  Pt. Request to Call Back. Spoke with patient. She prefers that we talk with daughter, [name]. Left [name] a voicemail message.  Care Coordination Interventions Activated:  No   Care Coordination Interventions:  No, not indicated    Chong Sicilian, BSN, RN-BC [facility] Pharmacist, community Dial: [phone]

## 2022-03-01 ENCOUNTER — Telehealth: Payer: Self-pay | Admitting: Adult Health

## 2022-03-01 NOTE — Telephone Encounter (Signed)
Rescheduled appointment per provider PAL. Patient is aware of the changes made to her upcoming appointment. 

## 2022-03-03 ENCOUNTER — Ambulatory Visit: Payer: Medicare HMO | Attending: Cardiology | Admitting: Cardiology

## 2022-03-03 ENCOUNTER — Encounter: Payer: Self-pay | Admitting: Cardiology

## 2022-03-03 VITALS — BP 122/70 | HR 66 | Ht 62.0 in | Wt 208.0 lb

## 2022-03-03 DIAGNOSIS — E782 Mixed hyperlipidemia: Secondary | ICD-10-CM

## 2022-03-03 DIAGNOSIS — I25118 Atherosclerotic heart disease of native coronary artery with other forms of angina pectoris: Secondary | ICD-10-CM

## 2022-03-03 DIAGNOSIS — I10 Essential (primary) hypertension: Secondary | ICD-10-CM | POA: Diagnosis not present

## 2022-03-03 NOTE — Patient Instructions (Signed)

## 2022-03-03 NOTE — Progress Notes (Signed)
Clinical Summary Gloria Mccoy is a 76 y.o.female seen today for follow up of the following medical problems.    1. CAD - admit Jan 2019 with unstable angina. - Jan 2019 with distal LM 80%, prox LAD 90%. She was referred for CABG/ s/p LIMA-LAD, SVG-D1, SVG-RI - Jan 2019 echo LVEF 55-60%   - 10/2017 admitted with chest pain. Admitted with NSTEMI - 10/2017 cath with ostial LAD 45%, mid LAD 80%, D1 40%, ramus 20%, LCX 25%, RCA patent. LIMA-LAD patent, SVG-ramus occluded, SVG-D1 99%. Recs for medical therapy, discharged on DAPT with plans for 1 year of treatment.        -no chest pains, no SOB/DOE - compliant with meds     2. Hyperlipidemia  - compliant with statin - recent labs followed by pcp   3. HTN - she is compliant with meds     4. Breast cancer - s/p lumpectomy -she is on chemoradiation Past Medical History:  Diagnosis Date   Arthritis    "hands sometimes" (07/13/2017)   Coronary artery disease    a. s/p CABG x3 in 06/2017 with LIMA-LAD, SVG-D1, and SVG-RI.  b. 10/2017: cath showing a widely patent LIMA-LAD with ostial occlusion of the SVG-RI and subtotally occluded atretic SVG-D1. Graft occlusion thought to be 2ry to improvement in pre-CABG stenoses.    Essential hypertension    Family history of adverse reaction to anesthesia    sister had a complication that was stated she had a "foggy" episode after her breast surgery was readmitted 1 day post op after being discharged from the hospital. Sister also has significant lung problems that could have contributed to this    Family history of breast cancer    Family history of colon cancer    Hypothyroid    Meniere disease    Myocardial infarction ([place]) 06/2017   during [facility] rehab   Obesity    Osteopenia 01/2012   T score -1.3 FRAX 7.9%/0.6%     Allergies  Allergen Reactions   Aloe Vera Itching    Per patient severe itching   Sulfa Antibiotics Shortness Of Breath   Sulfasalazine Shortness Of Breath      Current Outpatient Medications  Medication Sig Dispense Refill   acetaminophen (TYLENOL) 325 MG tablet Take 2 tablets (650 mg total) by mouth every 6 (six) hours as needed for mild pain.     aspirin EC 81 MG tablet Take 81 mg by mouth daily.     atorvastatin (LIPITOR) 80 MG tablet TAKE 1 TABLET BY MOUTH ONCE DAILY AT  6PM 90 tablet 3   cetaphil (CETAPHIL) lotion Apply 1 application topically as needed for dry skin. 236 mL 0   Cholecalciferol (VITAMIN D PO) Take 1 tablet by mouth daily.      Cyanocobalamin (VITAMIN B-12 PO) Take 1 tablet by mouth daily.     fish oil-omega-3 fatty acids 1000 MG capsule Take 1 g by mouth daily.     furosemide (LASIX) 40 MG tablet Take 40 mg by mouth daily.     levothyroxine (SYNTHROID) 25 MCG tablet Take 25 mcg by mouth daily.     levothyroxine (SYNTHROID, LEVOTHROID) 75 MCG tablet Take 75 mcg by mouth daily.     losartan (COZAAR) 25 MG tablet TAKE 1/2 TABLET EVERY DAY (Patient not taking: Reported on 09/24/2021) 45 tablet 3   metoprolol tartrate (LOPRESSOR) 25 MG tablet TAKE 1 TABLET TWICE DAILY 180 tablet 3   nitroGLYCERIN (NITROSTAT) 0.4 MG SL  tablet PLACE 1 TABLET UNDER THE TONGUE EVERY 5 (FIVE) MINUTES AS DIRECTED AS NEEDED FOR CHEST PAIN. (Patient not taking: Reported on 09/24/2021) 25 tablet 3   vitamin E 100 UNIT capsule Take 1 capsule (100 Units total) by mouth daily. 30 capsule 0   No current facility-administered medications for this visit.     Past Surgical History:  Procedure Laterality Date   BREAST LUMPECTOMY WITH RADIOACTIVE SEED AND SENTINEL LYMPH NODE BIOPSY Left 09/01/2018   Procedure: LEFT BREAST LUMPECTOMY WITH RADIOACTIVE SEED AND SENTINEL LYMPH NODE BIOPSY;  Surgeon: Jovita Kussmaul, MD;  Location: [facility];  Service: General;  Laterality: Left;   BREAST SURGERY     CARDIAC CATHETERIZATION  07/13/2017   CATARACT EXTRACTION W/PHACO Right 01/28/2015   Procedure: CATARACT EXTRACTION PHACO AND INTRAOCULAR LENS PLACEMENT :  CDE:  5.70;   Surgeon: Rutherford Guys, MD;  Location: [facility];  Service: Ophthalmology;  Laterality: Right;   CATARACT EXTRACTION W/PHACO Left 02/11/2015   Procedure: CATARACT EXTRACTION PHACO AND INTRAOCULAR LENS PLACEMENT (IOC);  Surgeon: Rutherford Guys, MD;  Location: [facility];  Service: Ophthalmology;  Laterality: Left;  CDE: 7.38   COLONOSCOPY N/A 09/26/2012   Procedure: COLONOSCOPY;  Surgeon: Jamesetta So, MD;  Location: [facility];  Service: Gastroenterology;  Laterality: N/A;   CORONARY ARTERY BYPASS GRAFT N/A 07/15/2017   Procedure: CORONARY ARTERY BYPASS GRAFTING (CABG) X 3 USING LEFT INTERNAL MAMMARY ARTERY AND RIGHT SAPHENOUS VEIN- ENDOSCOPICALLY HARVESTED;  Surgeon: Melrose Nakayama, MD;  Location: [facility];  Service: Open Heart Surgery;  Laterality: N/A;   IR IMAGING GUIDED PORT INSERTION  12/29/2018   IR REMOVAL TUN ACCESS W/ PORT W/O FL MOD SED  04/26/2019   LEFT HEART CATH AND CORONARY ANGIOGRAPHY N/A 07/13/2017   Procedure: LEFT HEART CATH AND CORONARY ANGIOGRAPHY;  Surgeon: Martinique, Peter M, MD;  Location: [facility];  Service: Cardiovascular;  Laterality: N/A;   LEFT HEART CATH AND CORS/GRAFTS ANGIOGRAPHY N/A 11/03/2017   Procedure: LEFT HEART CATH AND CORS/GRAFTS ANGIOGRAPHY;  Surgeon: Troy Sine, MD;  Location: [facility];  Service: Cardiovascular;  Laterality: N/A;   TEE WITHOUT CARDIOVERSION N/A 07/15/2017   Procedure: TRANSESOPHAGEAL ECHOCARDIOGRAM (TEE);  Surgeon: Melrose Nakayama, MD;  Location: [facility];  Service: Open Heart Surgery;  Laterality: N/A;   TUBAL LIGATION     TYMPANOPLASTY Left    fluid from ear drum     Allergies  Allergen Reactions   Aloe Vera Itching    Per patient severe itching   Sulfa Antibiotics Shortness Of Breath   Sulfasalazine Shortness Of Breath      Family History  Problem Relation Age of Onset   Diabetes Sister        AODM   Heart disease Sister 46       CABG   Breast cancer Sister    Heart disease Brother 64       In his  4s   Colon cancer Maternal Uncle 50   Diabetes Sister        AODM   Hypertension Daughter    Hypertension Daughter    Heart attack Father        In his 71s   Stroke Mother    Stroke Maternal Grandmother    Diabetes Maternal Grandfather        d. 109   Heart attack Paternal Grandmother    Colon cancer Maternal Uncle 77   Breast cancer Cousin  dx 83s; d. 34s   Breast cancer Niece 3     Social History Gloria Mccoy reports that she has never smoked. She quit smokeless tobacco use about 4 years ago.  Her smokeless tobacco use included snuff. Gloria Mccoy reports that she does not currently use alcohol after a past usage of about 1.0 standard drink of alcohol per week.   Review of Systems CONSTITUTIONAL: No weight loss, fever, chills, weakness or fatigue.  HEENT: Eyes: No visual loss, blurred vision, double vision or yellow sclerae.No hearing loss, sneezing, congestion, runny nose or sore throat.  SKIN: No rash or itching.  CARDIOVASCULAR: per hpi RESPIRATORY: No shortness of breath, cough or sputum.  GASTROINTESTINAL: No anorexia, nausea, vomiting or diarrhea. No abdominal pain or blood.  GENITOURINARY: No burning on urination, no polyuria NEUROLOGICAL: No headache, dizziness, syncope, paralysis, ataxia, numbness or tingling in the extremities. No change in bowel or bladder control.  MUSCULOSKELETAL: No muscle, back pain, joint pain or stiffness.  LYMPHATICS: No enlarged nodes. No history of splenectomy.  PSYCHIATRIC: No history of depression or anxiety.  ENDOCRINOLOGIC: No reports of sweating, cold or heat intolerance. No polyuria or polydipsia.  Marland Kitchen   Physical Examination Today's Vitals   03/03/22 0820  BP: 122/70  Pulse: 66  SpO2: 96%  Weight: 208 lb (94.3 kg)  Height: '5\' 2"'$  (1.575 m)   Body mass index is 38.04 kg/m.  Gen: resting comfortably, no acute distress HEENT: no scleral icterus, pupils equal round and reactive, no palptable cervical adenopathy,  CV:  RRR, no m/r/g, no jvd Resp: Clear to auscultation bilaterally GI: abdomen is soft, non-tender, non-distended, normal bowel sounds, no hepatosplenomegaly MSK: extremities are warm, no edema.  Skin: warm, no rash Neuro:  no focal deficits Psych: appropriate affect   Diagnostic Studies  Jan 2019 cath Mid LM lesion is 25% stenosed. Dist LM to Ost LAD lesion is 80% stenosed. Ost Cx to Prox Cx lesion is 30% stenosed. Ost 1st Mrg lesion is 50% stenosed. Prox LAD lesion is 90% stenosed. The left ventricular systolic function is normal. LV end diastolic pressure is normal. The left ventricular ejection fraction is 55-65% by visual estimate.   1. Single vessel obstructive CAD. Patient has complex ostial and proximal to mid LAD disease that involves the origin of the ramus intermediate and first diagonal respectively. 2. Normal LV function 3. Normal LVEDP   Plan: given complexity of lesions with ostial and bifurcation LAD disease I would recommend consideration for CABG.   Jan 2019 Carotid US Final Interpretation: Right Carotid: Velocities in the right ICA are consistent with a 1-39% stenosis.  Left Carotid: Velocities in the left ICA are consistent with a 1-39% stenosis. Vertebrals:  Both vertebral arteries were patent with antegrade flow. Subclavians:   Jan 2019 echo Study Conclusions   - Left ventricle: The cavity size was normal. Wall thickness was   increased in a pattern of mild LVH. Systolic function was normal.   The estimated ejection fraction was in the range of 55% to 60%.   Wall motion was normal; there were no regional wall motion   abnormalities. Doppler parameters are consistent with abnormal   left ventricular relaxation (grade 1 diastolic dysfunction). - Mitral valve: Valve area by pressure half-time: 1.26 cm^2.   Impressions:   - Normal LV systolic function; mild LVH; mild diastolic   dysfunction.   10/2017 cath Ost 1st Diag to 1st Diag lesion is 40%  stenosed. Ost Cx lesion is 25% stenosed. Ost Ramus lesion  is 20% stenosed. Ost LAD lesion is 45% stenosed. Prox LAD to Mid LAD lesion is 80% stenosed. Origin lesion is 100% stenosed. Origin lesion is 99% stenosed. Origin to Dist Graft lesion is 99% stenosed. Dist Graft lesion is 100% stenosed. LV end diastolic pressure is mildly elevated. The left ventricular systolic function is normal.   Hyperdynamic LV function with an EF of 65% without focal segmental wall motion abnormalities.   Native coronary obstructive disease with 40% ostial LAD stenosis, diffuse 40 to 50% proximal diagonal stenosis with 80% LAD stenosis diffusely after the diagonal takeoff and evidence for competitive filling to the mid LAD via the LIMA graft; 25% ostial smooth narrowing in the ramus intermediate vessel; 25% ostial smooth narrowing in the left circumflex vessel; normal RCA.   Widely patent LIMA graft which supplies the mid LAD.   Totally ostial occlusion of the SVG which had supplied the ramus intermediate vessel.   Subtotally occluded ostial vein graft with diffuse 99% narrowing insistent with an atretic graft  which does not fill the diagonal vessel.   POST CATH RECOMMENDATION: Medical therapy.  I suspect the early vein graft occlusion was contributed by improvement in the prior pre-CABG stenoses.   Assessment and Plan   1. CAD with chronic stable angina -no recent symptoms, continue current meds   2. Hyperlipidemia - request labs from pcp, continue atorvastatin   3. HTN -she is at goal, continue current meds     Arnoldo Lenis, M.D.

## 2022-03-26 ENCOUNTER — Ambulatory Visit: Payer: Medicare HMO | Admitting: Adult Health

## 2022-03-30 ENCOUNTER — Encounter: Payer: Self-pay | Admitting: Adult Health

## 2022-03-30 ENCOUNTER — Other Ambulatory Visit: Payer: Self-pay

## 2022-03-30 ENCOUNTER — Inpatient Hospital Stay: Payer: Medicare HMO | Attending: Adult Health | Admitting: Adult Health

## 2022-03-30 VITALS — BP 150/53 | HR 59 | Temp 97.5°F | Resp 18 | Ht 62.0 in | Wt 208.6 lb

## 2022-03-30 DIAGNOSIS — C50512 Malignant neoplasm of lower-outer quadrant of left female breast: Secondary | ICD-10-CM | POA: Insufficient documentation

## 2022-03-30 DIAGNOSIS — Z79899 Other long term (current) drug therapy: Secondary | ICD-10-CM | POA: Diagnosis not present

## 2022-03-30 DIAGNOSIS — Z823 Family history of stroke: Secondary | ICD-10-CM | POA: Insufficient documentation

## 2022-03-30 DIAGNOSIS — Z8249 Family history of ischemic heart disease and other diseases of the circulatory system: Secondary | ICD-10-CM | POA: Insufficient documentation

## 2022-03-30 DIAGNOSIS — I10 Essential (primary) hypertension: Secondary | ICD-10-CM | POA: Diagnosis not present

## 2022-03-30 DIAGNOSIS — I25110 Atherosclerotic heart disease of native coronary artery with unstable angina pectoris: Secondary | ICD-10-CM | POA: Diagnosis not present

## 2022-03-30 DIAGNOSIS — Z171 Estrogen receptor negative status [ER-]: Secondary | ICD-10-CM | POA: Insufficient documentation

## 2022-03-30 DIAGNOSIS — I252 Old myocardial infarction: Secondary | ICD-10-CM | POA: Insufficient documentation

## 2022-03-30 DIAGNOSIS — Z87891 Personal history of nicotine dependence: Secondary | ICD-10-CM | POA: Diagnosis not present

## 2022-03-30 DIAGNOSIS — Z803 Family history of malignant neoplasm of breast: Secondary | ICD-10-CM | POA: Diagnosis not present

## 2022-03-30 DIAGNOSIS — Z833 Family history of diabetes mellitus: Secondary | ICD-10-CM | POA: Diagnosis not present

## 2022-03-30 DIAGNOSIS — Z800 Family history of malignant neoplasm of digestive organs: Secondary | ICD-10-CM | POA: Insufficient documentation

## 2022-03-30 NOTE — Assessment & Plan Note (Signed)
Gloria Mccoy is a 76 year old woman with history of stage Ib triple negative breast cancer diagnosed in February 2020 status post left lumpectomy, adjuvant chemotherapy, and adjuvant radiation.  Gloria Mccoy has no clinical or radiographic sign of breast cancer recurrence.  She will continue on observation alone.  We discussed healthy diet and exercise today.  I recommended she continue to follow-up with her primary care provider for her preventative health care.  We are going to reach out to them to ascertain when her pneumonia vaccine was because we do not have one listed in her care gaps.  We will see Gloria Mccoy back in 6 months she will follow-up with Dr. Lindi Adie and I will see her back in a year.

## 2022-03-30 NOTE — Progress Notes (Signed)
[facility]:    [facility], [place] [address]   DIAGNOSIS:  Cancer Staging  Malignant neoplasm of lower-outer quadrant of left breast of female, estrogen receptor negative ([place]) Staging form: Breast, AJCC 8th Edition - Clinical: Stage IB (cT1b, cN0(sn), cM0, G2, ER-, PR-, HER2-) - Unsigned Method of lymph node assessment: Sentinel lymph node biopsy Histologic grading system: 3 grade system Laterality: Left Tumor size (mm): 7 - Pathologic: Stage IB (pT1c, pN0, cM0, G2, ER-, PR-, HER2-) - Signed by Gardenia Phlegm, NP on 09/13/2018 Neoadjuvant therapy: No Histologic grading system: 3 grade system   SUMMARY OF ONCOLOGIC HISTORY: Oncology History  Malignant neoplasm of lower-outer quadrant of left breast of female, estrogen receptor negative ([place])  08/01/2018 Initial Diagnosis   Screening mammography revealed an abnormality in the left breast. Diagnostic testing revealed a 0.7 cm mass at 7 o'clock, 7 cm from the nipple. No lymphadenopathy seen in the left axilla. Biopsy ([id number]) revealed: IDC, grade II; ER 0%, PR 0%, Ki67 40%, HER2 negative (1+). Clinical T1c N0.   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  The Hereditary Gene Panel offered by [facility] includes sequencing and/or deletion duplication testing of the following 47 genes: APC, ATM, AXIN2, BARD1, BMPR1A, BRCA1, BRCA2, BRIP1, CDH1, CDK4, CDKN2A (p14ARF), CDKN2A (p16INK4a), CHEK2, CTNNA1, DICER1, EPCAM (Deletion/duplication testing only), GREM1 (promoter region deletion/duplication testing only), KIT, MEN1, MLH1, MSH2, MSH3, MSH6, MUTYH, NBN, NF1, NHTL1, PALB2, PDGFRA, PMS2, POLD1, POLE, PTEN, RAD50, RAD51C, RAD51D, SDHB, SDHC, SDHD, SMAD4, SMARCA4. STK11, TP53, TSC1, TSC2, and VHL.  The following genes were evaluated for sequence changes only: SDHA and HOXB13 c.251G>A variant only. The report date is  08-29-2018.   08/24/2018 Breast MRI   Bilateral breast MRI with and without contrast.  Right breast: No suspicious mass or abnormal enhancement. Benign intramammary lymph nodes in the outer right breast.   Left breast: In the posterior third of the lower inner quadrant of the left breast is an irregular enhancing mass spanning approximately 1.3 x 0.8 x 0.5 cm with internal biopsy clip artifact and washout kinetics, corresponding to the   biopsy-proven malignancy. The mass/enhancement do not involve the pectoralis muscle. Benign intramammary lymph node in the middle third of the outer left breast is mammographically stable. There is more glandular tissue in the left breast on the right, corresponding with chronic appearance of mammograms dating back to at least 2010.  Lymph nodes: No abnormal appearing lymph nodes.   09/01/2018 Surgery   Left lumpectomy Marlou Starks) [phone]): IDC, nottingham grade 2 of 3, 1.1 cm. Margins uninvolved. 0/3 lymph nodes positive for carcinoma. Pathologic stage pT1c pN0.   09/13/2018 Cancer Staging   Staging form: Breast, AJCC 8th Edition - Pathologic: Stage IB (pT1c, pN0, cM0, G2, ER-, PR-, HER2-) - Signed by Gardenia Phlegm, NP on 09/13/2018   10/24/2018 - 04/05/2019 Chemotherapy   Patient is on Treatment Plan : BREAST Adjuvant CMF IV q21d     12/05/2018 - 01/19/2019 Radiation Therapy   Lisbeth Renshaw) The patient initially received a dose of 50.4 Gy in 28 fractions to the breast using whole-breast tangent fields. This was delivered using a 3-D conformal technique. The patient then received a boost to the seroma. This delivered an additional 10 Gy in 5 fractions using a 3 field photon boost technique. The total dose was 60.4 Gy.     CURRENT THERAPY: observation  INTERVAL HISTORY: Atilano Ina  76 y.o. female returns for follow-up of her triple negative breast cancer.  Her most recent mammogram was completed on December 07, 2021 which demonstrated no mammographic evidence  of malignancy and breast density category B.  Yaffa is doing well today.  She has had no new issues with her breast.  She denies any new symptoms or changes in her health.  He is accompanied by her daughter [name] today.   Patient Active Problem List   Diagnosis Date Noted   Port-A-Cath in place 01/16/2019   Genetic testing 08/29/2018   Family history of breast cancer    Family history of colon cancer    Malignant neoplasm of lower-outer quadrant of left breast of female, estrogen receptor negative ([place]) 08/16/2018   NSTEMI (non-ST elevated myocardial infarction) ([place]) 11/02/2017   S/P CABG x 3 07/15/2017   Coronary artery disease 07/15/2017   Unstable angina ([place]) 07/13/2017   Essential hypertension 07/13/2017   Obesity 07/13/2017   Hyperglycemia 07/13/2017   Hypothyroid 01/06/2012   Meniere disease    Arthritis     is allergic to aloe vera, sulfa antibiotics, and sulfasalazine.  MEDICAL HISTORY: Past Medical History:  Diagnosis Date   Arthritis    "hands sometimes" (07/13/2017)   Coronary artery disease    a. s/p CABG x3 in 06/2017 with LIMA-LAD, SVG-D1, and SVG-RI.  b. 10/2017: cath showing a widely patent LIMA-LAD with ostial occlusion of the SVG-RI and subtotally occluded atretic SVG-D1. Graft occlusion thought to be 2ry to improvement in pre-CABG stenoses.    Essential hypertension    Family history of adverse reaction to anesthesia    sister had a complication that was stated she had a "foggy" episode after her breast surgery was readmitted 1 day post op after being discharged from the hospital. Sister also has significant lung problems that could have contributed to this    Family history of breast cancer    Family history of colon cancer    Hypothyroid    Meniere disease    Myocardial infarction ([place]) 06/2017   during [facility]aic rehab   Obesity    Osteopenia 01/2012   T score -1.3 FRAX 7.9%/0.6%    SURGICAL HISTORY: Past Surgical History:  Procedure Laterality  Date   BREAST LUMPECTOMY WITH RADIOACTIVE SEED AND SENTINEL LYMPH NODE BIOPSY Left 09/01/2018   Procedure: LEFT BREAST LUMPECTOMY WITH RADIOACTIVE SEED AND SENTINEL LYMPH NODE BIOPSY;  Surgeon: Jovita Kussmaul, MD;  Location: [facility];  Service: General;  Laterality: Left;   BREAST SURGERY     CARDIAC CATHETERIZATION  07/13/2017   CATARACT EXTRACTION W/PHACO Right 01/28/2015   Procedure: CATARACT EXTRACTION PHACO AND INTRAOCULAR LENS PLACEMENT :  CDE:  5.70;  Surgeon: Rutherford Guys, MD;  Location: [facility];  Service: Ophthalmology;  Laterality: Right;   CATARACT EXTRACTION W/PHACO Left 02/11/2015   Procedure: CATARACT EXTRACTION PHACO AND INTRAOCULAR LENS PLACEMENT (IOC);  Surgeon: Rutherford Guys, MD;  Location: [facility];  Service: Ophthalmology;  Laterality: Left;  CDE: 7.38   COLONOSCOPY N/A 09/26/2012   Procedure: COLONOSCOPY;  Surgeon: Jamesetta So, MD;  [facility];  Service: Gastroenterology;  Laterality: N/A;   CORONARY ARTERY BYPASS GRAFT N/A 07/15/2017   Procedure: CORONARY ARTERY BYPASS GRAFTING (CABG) X 3 USING LEFT INTERNAL MAMMARY ARTERY AND RIGHT SAPHENOUS VEIN- ENDOSCOPICALLY HARVESTED;  Surgeon: Melrose Nakayama, MD;  Location: [facility];  Service: Open Heart Surgery;  Laterality: N/A;   IR IMAGING GUIDED PORT INSERTION  12/29/2018   IR REMOVAL TUN  ACCESS W/ PORT W/O FL MOD SED  04/26/2019   LEFT HEART CATH AND CORONARY ANGIOGRAPHY N/A 07/13/2017   Procedure: LEFT HEART CATH AND CORONARY ANGIOGRAPHY;  Surgeon: Martinique, Peter M, MD;  Location: [facility];  Service: Cardiovascular;  Laterality: N/A;   LEFT HEART CATH AND CORS/GRAFTS ANGIOGRAPHY N/A 11/03/2017   Procedure: LEFT HEART CATH AND CORS/GRAFTS ANGIOGRAPHY;  Surgeon: Troy Sine, MD;  Location: [facility];  Service: Cardiovascular;  Laterality: N/A;   TEE WITHOUT CARDIOVERSION N/A 07/15/2017   Procedure: TRANSESOPHAGEAL ECHOCARDIOGRAM (TEE);  Surgeon: Melrose Nakayama, MD;  Location: [facility];  Service: Open  Heart Surgery;  Laterality: N/A;   TUBAL LIGATION     TYMPANOPLASTY Left    fluid from ear drum    SOCIAL HISTORY: Social History   Socioeconomic History   Marital status: Married    Spouse name: Not on file   Number of children: Not on file   Years of education: Not on file   Highest education level: Not on file  Occupational History   Occupation: Retired  Tobacco Use   Smoking status: Never   Smokeless tobacco: Former    Types: Snuff    Quit date: 07/31/2017  Vaping Use   Vaping Use: Never used  Substance and Sexual Activity   Alcohol use: Not Currently    Alcohol/week: 1.0 standard drink of alcohol    Types: 1 Standard drinks or equivalent per week    Comment: occasionally   Drug use: No   Sexual activity: Yes    Birth control/protection: Surgical  Other Topics Concern   Not on file  Social History Narrative      Left handed    Lives at home with husband    Caffeine- some    Social Determinants of Health   Financial Resource Strain: Not on file  Food Insecurity: No Food Insecurity (05/14/2019)   Hunger Vital Sign    Worried About Running Out of Food in the Last Year: Never true    [place] in the Last Year: Never true  Transportation Needs: No Transportation Needs (08/16/2018)   PRAPARE - Hydrologist (Medical): No    Lack of Transportation (Non-Medical): No  Physical Activity: Not on file  Stress: Not on file  Social Connections: Not on file  Intimate Partner Violence: Not on file    FAMILY HISTORY: Family History  Problem Relation Age of Onset   Diabetes Sister        AODM   Heart disease Sister 68       CABG   Breast cancer Sister    Heart disease Brother 18       In his 97s   Colon cancer Maternal Uncle 23   Diabetes Sister        AODM   Hypertension Daughter    Hypertension Daughter    Heart attack Father        In his 63s   Stroke Mother    Stroke Maternal Grandmother    Diabetes Maternal Grandfather         d. 109   Heart attack Paternal Grandmother    Colon cancer Maternal Uncle 18   Breast cancer Cousin        dx 34s; d. 21s   Breast cancer Niece 38    Review of Systems  Constitutional:  Negative for appetite change, chills, fatigue, fever and unexpected weight change.  HENT:  Negative for hearing loss, lump/mass and trouble swallowing.   Eyes:  Negative for eye problems and icterus.  Respiratory:  Negative for chest tightness, cough and shortness of breath.   Cardiovascular:  Negative for chest pain, leg swelling and palpitations.  Gastrointestinal:  Negative for abdominal distention, abdominal pain, constipation, diarrhea, nausea and vomiting.  Endocrine: Negative for hot flashes.  Genitourinary:  Negative for difficulty urinating.   Musculoskeletal:  Negative for arthralgias.  Skin:  Negative for itching and rash.  Neurological:  Negative for dizziness, extremity weakness, headaches and numbness.  Hematological:  Negative for adenopathy. Does not bruise/bleed easily.  Psychiatric/Behavioral:  Negative for depression. The patient is not nervous/anxious.       PHYSICAL EXAMINATION  ECOG PERFORMANCE STATUS: 0 - Asymptomatic  Vitals:   03/30/22 0947  BP: (!) 150/53  Pulse: (!) 59  Resp: 18  Temp: (!) 97.5 F (36.4 C)  SpO2: 95%    Physical Exam Constitutional:      General: She is not in acute distress.    Appearance: Normal appearance. She is not toxic-appearing.  HENT:     Head: Normocephalic and atraumatic.  Eyes:     General: No scleral icterus. Cardiovascular:     Rate and Rhythm: Normal rate and regular rhythm.     Pulses: Normal pulses.     Heart sounds: Normal heart sounds.  Pulmonary:     Effort: Pulmonary effort is normal.     Breath sounds: Normal breath sounds.  Chest:     Comments: Left breast status postlumpectomy and radiation no sign of local recurrence right breast is benign. Abdominal:     General: Abdomen is flat. Bowel sounds are  normal. There is no distension.     Palpations: Abdomen is soft.     Tenderness: There is no abdominal tenderness.  Musculoskeletal:        General: No swelling.     Cervical back: Neck supple.  Lymphadenopathy:     Cervical: No cervical adenopathy.  Skin:    General: Skin is warm and dry.     Findings: No rash.  Neurological:     General: No focal deficit present.     Mental Status: She is alert.  Psychiatric:        Mood and Affect: Mood normal.        Behavior: Behavior normal.     LABORATORY DATA:  None for this visit    ASSESSMENT and THERAPY PLAN:   Malignant neoplasm of lower-outer quadrant of left breast of female, estrogen receptor negative ([place]) Aylyn is a 77 year old woman with history of stage Ib triple negative breast cancer diagnosed in February 2020 status post left lumpectomy, adjuvant chemotherapy, and adjuvant radiation.  Amberlea has no clinical or radiographic sign of breast cancer recurrence.  She will continue on observation alone.  We discussed healthy diet and exercise today.  I recommended she continue to follow-up with her primary care provider for her preventative health care.  We are going to reach out to them to ascertain when her pneumonia vaccine was because we do not have one listed in her care gaps.  We will see Alizee back in 6 months she will follow-up with Dr. Lindi Adie and I will see her back in a year.   No orders of the defined types were placed in this encounter.   All questions were answered. The patient knows to call the clinic with any problems, questions or concerns. We can certainly see the patient much  sooner if necessary.  Total encounter time:20 minutes*in face-to-face visit time, chart review, lab review, care coordination, order entry, and documentation of the encounter time.    Wilber Bihari, NP 03/30/22 10:48 AM Medical Oncology and Hematology [facility] [place], [place] [address] Tel.  [phone]    Fax. [phone]  *Total Encounter Time as defined by the Centers for Medicare and Medicaid Services includes, in addition to the face-to-face time of a patient visit (documented in the note above) non-face-to-face time: obtaining and reviewing outside history, ordering and reviewing medications, tests or procedures, care coordination (communications with other health care professionals or caregivers) and documentation in the medical record.

## 2022-03-31 ENCOUNTER — Other Ambulatory Visit: Payer: Self-pay | Admitting: Cardiology

## 2022-04-05 ENCOUNTER — Encounter: Payer: Self-pay | Admitting: *Deleted

## 2022-04-05 ENCOUNTER — Telehealth: Payer: Self-pay | Admitting: *Deleted

## 2022-04-05 NOTE — Patient Outreach (Signed)
  Care Coordination   Initial Visit Note   04/05/2022 Name: XIN KLAWITTER MRN: [id number] DOB: 02-15-1946  CHERRY TURLINGTON is a 76 y.o. year old female who sees [facility], MD for primary care. I spoke with  Atilano Ina by phone today.  What matters to the patients health and wellness today?  Report she is doing well with chronic care management.  Monitoring BP daily, eat low salt diet and walking for exercise. Denies needing follow up at this time, denies any urgent concerns, encouraged to contact this care manager with questions.      Goals Addressed             This Visit's Progress    COMPLETED: Care Coordination Activities - No follow up needed       Care Coordination Interventions: Patient interviewed about adult health maintenance status including  Falls risk assessment    Regular eye checkups Regular Dental Care    Blood Pressure    Advised patient to discuss  Pneumonia Vaccine Influenza Vaccine COVID vaccination    with primary care provider  Provided education about overall health maintenance SDOH assessment complete Confirmed AWV completed in August 2023         SDOH assessments and interventions completed:  Yes  SDOH Interventions Today    Flowsheet Row Most Recent Value  SDOH Interventions   Food Insecurity Interventions Intervention Not Indicated  Housing Interventions Intervention Not Indicated  Transportation Interventions Intervention Not Indicated  Utilities Interventions Intervention Not Indicated        Care Coordination Interventions Activated:  Yes  Care Coordination Interventions:  Yes, provided   Follow up plan: No further intervention required.   Encounter Outcome:  Pt. Visit Completed   Valente David, RN, MSN, [facility] Care Management Coordinator [phone]

## 2022-05-05 DIAGNOSIS — J329 Chronic sinusitis, unspecified: Secondary | ICD-10-CM | POA: Diagnosis not present

## 2022-05-05 DIAGNOSIS — I10 Essential (primary) hypertension: Secondary | ICD-10-CM | POA: Diagnosis not present

## 2022-05-05 DIAGNOSIS — E6609 Other obesity due to excess calories: Secondary | ICD-10-CM | POA: Diagnosis not present

## 2022-05-05 DIAGNOSIS — M47812 Spondylosis without myelopathy or radiculopathy, cervical region: Secondary | ICD-10-CM | POA: Diagnosis not present

## 2022-05-05 DIAGNOSIS — Z6835 Body mass index (BMI) 35.0-35.9, adult: Secondary | ICD-10-CM | POA: Diagnosis not present

## 2022-06-11 DIAGNOSIS — Z961 Presence of intraocular lens: Secondary | ICD-10-CM | POA: Diagnosis not present

## 2022-06-11 DIAGNOSIS — H524 Presbyopia: Secondary | ICD-10-CM | POA: Diagnosis not present

## 2022-08-31 ENCOUNTER — Encounter: Payer: Self-pay | Admitting: Cardiology

## 2022-08-31 ENCOUNTER — Ambulatory Visit: Payer: Medicare HMO | Attending: Cardiology | Admitting: Cardiology

## 2022-08-31 VITALS — BP 128/78 | HR 60 | Ht 62.0 in | Wt 207.8 lb

## 2022-08-31 DIAGNOSIS — I25118 Atherosclerotic heart disease of native coronary artery with other forms of angina pectoris: Secondary | ICD-10-CM

## 2022-08-31 DIAGNOSIS — I10 Essential (primary) hypertension: Secondary | ICD-10-CM

## 2022-08-31 DIAGNOSIS — E782 Mixed hyperlipidemia: Secondary | ICD-10-CM

## 2022-08-31 NOTE — Progress Notes (Signed)
Clinical Summary Gloria Mccoy is a 77 y.o.female seen today for follow up of the following medical problems.    1. CAD - admit Jan 2019 with unstable angina. - Jan 2019 with distal LM 80%, prox LAD 90%. She was referred for CABG/ s/p LIMA-LAD, SVG-D1, SVG-RI - Jan 2019 echo LVEF 55-60%   - 10/2017 admitted with chest pain. Admitted with NSTEMI - 10/2017 cath with ostial LAD 45%, mid LAD 80%, D1 40%, ramus 20%, LCX 25%, RCA patent. LIMA-LAD patent, SVG-ramus occluded, SVG-D1 99%. Recs for medical therapy, discharged on DAPT with plans for 1 year of treatment.      - no chest pains, no SOB/DOE - compliant with meds     2. Hyperlipidemia  - compliant with statin - recent labs followed by pcp  01/2022 TC 81 TG 82 HDL 30 LDL 34   3. HTN - she is compliant with meds     4. Breast cancer - s/p lumpectomy -she is on chemoradiation, l;umpectomy - being monitored, undergoing observation.  Past Medical History:  Diagnosis Date   Arthritis    "hands sometimes" (07/13/2017)   Coronary artery disease    a. s/p CABG x3 in 06/2017 with LIMA-LAD, SVG-D1, and SVG-RI.  b. 10/2017: cath showing a widely patent LIMA-LAD with ostial occlusion of the SVG-RI and subtotally occluded atretic SVG-D1. Graft occlusion thought to be 2ry to improvement in pre-CABG stenoses.    Essential hypertension    Family history of adverse reaction to anesthesia    sister had a complication that was stated she had a "foggy" episode after her breast surgery was readmitted 1 day post op after being discharged from the hospital. Sister also has significant lung problems that could have contributed to this    Family history of breast cancer    Family history of colon cancer    Hypothyroid    Meniere disease    Myocardial infarction ([place]) 06/2017   during cardaic rehab   Obesity    Osteopenia 01/2012   T score -1.3 FRAX 7.9%/0.6%     Allergies  Allergen Reactions   Aloe Vera Itching    Per patient severe  itching   Sulfa Antibiotics Shortness Of Breath   Sulfasalazine Shortness Of Breath     Current Outpatient Medications  Medication Sig Dispense Refill   acetaminophen (TYLENOL) 325 MG tablet Take 2 tablets (650 mg total) by mouth every 6 (six) hours as needed for mild pain.     aspirin EC 81 MG tablet Take 81 mg by mouth daily.     atorvastatin (LIPITOR) 80 MG tablet TAKE 1 TABLET BY MOUTH ONCE DAILY AT  6PM 90 tablet 3   cetaphil (CETAPHIL) lotion Apply 1 application topically as needed for dry skin. (Patient not taking: Reported on 03/03/2022) 236 mL 0   Cholecalciferol (VITAMIN D PO) Take 1 tablet by mouth daily.      Cyanocobalamin (VITAMIN B-12 PO) Take 1 tablet by mouth daily.     fish oil-omega-3 fatty acids 1000 MG capsule Take 1 g by mouth daily.     furosemide (LASIX) 40 MG tablet Take 40 mg by mouth daily.     levothyroxine (SYNTHROID) 25 MCG tablet Take 25 mcg by mouth daily.     levothyroxine (SYNTHROID, LEVOTHROID) 75 MCG tablet Take 75 mcg by mouth daily.     losartan (COZAAR) 25 MG tablet TAKE 1/2 TABLET EVERY DAY 45 tablet 3   metoprolol tartrate (LOPRESSOR) 25 MG  tablet TAKE 1 TABLET TWICE DAILY 180 tablet 3   nitroGLYCERIN (NITROSTAT) 0.4 MG SL tablet PLACE 1 TABLET UNDER THE TONGUE EVERY 5 (FIVE) MINUTES AS DIRECTED AS NEEDED FOR CHEST PAIN. 25 tablet 3   vitamin E 100 UNIT capsule Take 1 capsule (100 Units total) by mouth daily. 30 capsule 0   No current facility-administered medications for this visit.     Past Surgical History:  Procedure Laterality Date   BREAST LUMPECTOMY WITH RADIOACTIVE SEED AND SENTINEL LYMPH NODE BIOPSY Left 09/01/2018   Procedure: LEFT BREAST LUMPECTOMY WITH RADIOACTIVE SEED AND SENTINEL LYMPH NODE BIOPSY;  Surgeon: Jovita Kussmaul, MD;  Location: [facility];  Service: General;  Laterality: Left;   BREAST SURGERY     CARDIAC CATHETERIZATION  07/13/2017   CATARACT EXTRACTION W/PHACO Right 01/28/2015   Procedure: CATARACT EXTRACTION PHACO AND  INTRAOCULAR LENS PLACEMENT :  CDE:  5.70;  Surgeon: Rutherford Guys, MD;  Location: [facility];  Service: Ophthalmology;  Laterality: Right;   CATARACT EXTRACTION W/PHACO Left 02/11/2015   Procedure: CATARACT EXTRACTION PHACO AND INTRAOCULAR LENS PLACEMENT (IOC);  Surgeon: Rutherford Guys, MD;  Location: [facility];  Service: Ophthalmology;  Laterality: Left;  CDE: 7.38   COLONOSCOPY N/A 09/26/2012   Procedure: COLONOSCOPY;  Surgeon: Jamesetta So, MD;  [facility];  Service: Gastroenterology;  Laterality: N/A;   CORONARY ARTERY BYPASS GRAFT N/A 07/15/2017   Procedure: CORONARY ARTERY BYPASS GRAFTING (CABG) X 3 USING LEFT INTERNAL MAMMARY ARTERY AND RIGHT SAPHENOUS VEIN- ENDOSCOPICALLY HARVESTED;  Surgeon: Melrose Nakayama, MD;  Location: [facility] [place];  Service: Open Heart Surgery;  Laterality: N/A;   IR IMAGING GUIDED PORT INSERTION  12/29/2018   IR REMOVAL TUN ACCESS W/ PORT W/O FL MOD SED  04/26/2019   LEFT HEART CATH AND CORONARY ANGIOGRAPHY N/A 07/13/2017   Procedure: LEFT HEART CATH AND CORONARY ANGIOGRAPHY;  Surgeon: Martinique, Peter M, MD;  Location: [facility];  Service: Cardiovascular;  Laterality: N/A;   LEFT HEART CATH AND CORS/GRAFTS ANGIOGRAPHY N/A 11/03/2017   Procedure: LEFT HEART CATH AND CORS/GRAFTS ANGIOGRAPHY;  Surgeon: Troy Sine, MD;  Location: [place];  Service: Cardiovascular;  Laterality: N/A;   TEE WITHOUT CARDIOVERSION N/A 07/15/2017   Procedure: TRANSESOPHAGEAL ECHOCARDIOGRAM (TEE);  Surgeon: Melrose Nakayama, MD;  Location: [facility];  Service: Open Heart Surgery;  Laterality: N/A;   TUBAL LIGATION     TYMPANOPLASTY Left    fluid from ear drum     Allergies  Allergen Reactions   Aloe Vera Itching    Per patient severe itching   Sulfa Antibiotics Shortness Of Breath   Sulfasalazine Shortness Of Breath      Family History  Problem Relation Age of Onset   Diabetes Sister        AODM   Heart disease Sister 25       CABG   Breast cancer Sister     Heart disease Brother 9       In his 4s   Colon cancer Maternal Uncle 29   Diabetes Sister        AODM   Hypertension Daughter    Hypertension Daughter    Heart attack Father        In his 21s   Stroke Mother    Stroke Maternal Grandmother    Diabetes Maternal Grandfather        d. 109   Heart attack Paternal Grandmother    Colon cancer Maternal Uncle 75  Breast cancer Cousin        dx 52s; d. 34s   Breast cancer Niece 29     Social History Gloria Mccoy reports that she has never smoked. She quit smokeless tobacco use about 5 years ago.  Her smokeless tobacco use included snuff. Gloria Mccoy reports that she does not currently use alcohol after a past usage of about 1.0 standard drink of alcohol per week.   Review of Systems CONSTITUTIONAL: No weight loss, fever, chills, weakness or fatigue.  HEENT: Eyes: No visual loss, blurred vision, double vision or yellow sclerae.No hearing loss, sneezing, congestion, runny nose or sore throat.  SKIN: No rash or itching.  CARDIOVASCULAR: per hpi RESPIRATORY: No shortness of breath, cough or sputum.  GASTROINTESTINAL: No anorexia, nausea, vomiting or diarrhea. No abdominal pain or blood.  GENITOURINARY: No burning on urination, no polyuria NEUROLOGICAL: No headache, dizziness, syncope, paralysis, ataxia, numbness or tingling in the extremities. No change in bowel or bladder control.  MUSCULOSKELETAL: No muscle, back pain, joint pain or stiffness.  LYMPHATICS: No enlarged nodes. No history of splenectomy.  PSYCHIATRIC: No history of depression or anxiety.  ENDOCRINOLOGIC: No reports of sweating, cold or heat intolerance. No polyuria or polydipsia.  Marland Kitchen   Physical Examination Today's Vitals   08/31/22 1028  BP: (!) 142/78  Pulse: 60  SpO2: 100%  Weight: 207 lb 12.8 oz (94.3 kg)  Height: '5\' 2"'$  (1.575 m)   Body mass index is 38.01 kg/m.  Gen: resting comfortably, no acute distress HEENT: no scleral icterus, pupils equal round  and reactive, no palptable cervical adenopathy,  CV: RRR, no mrg, no jvd Resp: Clear to auscultation bilaterally GI: abdomen is soft, non-tender, non-distended, normal bowel sounds, no hepatosplenomegaly MSK: extremities are warm, no edema.  Skin: warm, no rash Neuro:  no focal deficits Psych: appropriate affect   Diagnostic Studies  Jan 2019 cath Mid LM lesion is 25% stenosed. Dist LM to Ost LAD lesion is 80% stenosed. Ost Cx to Prox Cx lesion is 30% stenosed. Ost 1st Mrg lesion is 50% stenosed. Prox LAD lesion is 90% stenosed. The left ventricular systolic function is normal. LV end diastolic pressure is normal. The left ventricular ejection fraction is 55-65% by visual estimate.   1. Single vessel obstructive CAD. Patient has complex ostial and proximal to mid LAD disease that involves the origin of the ramus intermediate and first diagonal respectively. 2. Normal LV function 3. Normal LVEDP   Plan: given complexity of lesions with ostial and bifurcation LAD disease I would recommend consideration for CABG.   Jan 2019 Carotid US Final Interpretation: Right Carotid: Velocities in the right ICA are consistent with a 1-39% stenosis.  Left Carotid: Velocities in the left ICA are consistent with a 1-39% stenosis. Vertebrals:  Both vertebral arteries were patent with antegrade flow. Subclavians:   Jan 2019 echo Study Conclusions   - Left ventricle: The cavity size was normal. Wall thickness was   increased in a pattern of mild LVH. Systolic function was normal.   The estimated ejection fraction was in the range of 55% to 60%.   Wall motion was normal; there were no regional wall motion   abnormalities. Doppler parameters are consistent with abnormal   left ventricular relaxation (grade 1 diastolic dysfunction). - Mitral valve: Valve area by pressure half-time: 1.26 cm^2.   Impressions:   - Normal LV systolic function; mild LVH; mild diastolic   dysfunction.   10/2017  cath Ost 1st Diag to 1st Diag  lesion is 40% stenosed. Ost Cx lesion is 25% stenosed. Ost Ramus lesion is 20% stenosed. Ost LAD lesion is 45% stenosed. Prox LAD to Mid LAD lesion is 80% stenosed. Origin lesion is 100% stenosed. Origin lesion is 99% stenosed. Origin to Dist Graft lesion is 99% stenosed. Dist Graft lesion is 100% stenosed. LV end diastolic pressure is mildly elevated. The left ventricular systolic function is normal.   Hyperdynamic LV function with an EF of 65% without focal segmental wall motion abnormalities.   Native coronary obstructive disease with 40% ostial LAD stenosis, diffuse 40 to 50% proximal diagonal stenosis with 80% LAD stenosis diffusely after the diagonal takeoff and evidence for competitive filling to the mid LAD via the LIMA graft; 25% ostial smooth narrowing in the ramus intermediate vessel; 25% ostial smooth narrowing in the left circumflex vessel; normal RCA.   Widely patent LIMA graft which supplies the mid LAD.   Totally ostial occlusion of the SVG which had supplied the ramus intermediate vessel.   Subtotally occluded ostial vein graft with diffuse 99% narrowing insistent with an atretic graft  which does not fill the diagonal vessel.   POST CATH RECOMMENDATION: Medical therapy.  I suspect the early vein graft occlusion was contributed by improvement in the prior pre-CABG stenoses.   Assessment and Plan   1. CAD with chronic stable angina -doing well without symptoms, continue current meds   2. Hyperlipidemia -at goal, continue current meds   3. HTN -bp is at goal, continue current meds  F/u 6 months     Arnoldo Lenis, M.D.

## 2022-08-31 NOTE — Patient Instructions (Addendum)
Medication Instructions:  Continue all current medications.   Labwork: none  Testing/Procedures: none  Follow-Up: 6 months   Any Other Special Instructions Will Be Listed Below (If Applicable).   If you need a refill on your cardiac medications before your next appointment, please call your pharmacy.  

## 2022-09-13 ENCOUNTER — Other Ambulatory Visit (HOSPITAL_COMMUNITY): Payer: Self-pay | Admitting: Internal Medicine

## 2022-09-13 DIAGNOSIS — Z1231 Encounter for screening mammogram for malignant neoplasm of breast: Secondary | ICD-10-CM

## 2022-09-27 NOTE — Progress Notes (Signed)
Patient Care Team: Elfredia Nevins, MD as PCP - General (Internal Medicine) Wyline Mood Dorothe Pea, MD as PCP - Cardiology (Cardiology) Jethro Bolus, MD as Consulting Physician (Ophthalmology) Griselda Miner, MD as Consulting Physician (General Surgery) Pollyann Savoy, MD as Consulting Physician (Rheumatology) Axel Filler, Larna Daughters, NP as Nurse Practitioner (Hematology and Oncology) Serena Croissant, MD as Consulting Physician (Hematology and Oncology) Kemper Durie, RN as [facility] Care Management  DIAGNOSIS: No diagnosis found.  SUMMARY OF ONCOLOGIC HISTORY: Oncology History  Malignant neoplasm of lower-outer quadrant of left breast of female, estrogen receptor negative  08/01/2018 Initial Diagnosis   Screening mammography revealed an abnormality in the left breast. Diagnostic testing revealed a 0.7 cm mass at 7 o'clock, 7 cm from the nipple. No lymphadenopathy seen in the left axilla. Biopsy ([id number]) revealed: IDC, grade II; ER 0%, PR 0%, Ki67 40%, HER2 negative (1+). Clinical T1c N0.   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  The Hereditary Gene Panel offered by [facility] includes sequencing and/or deletion duplication testing of the following 47 genes: APC, ATM, AXIN2, BARD1, BMPR1A, BRCA1, BRCA2, BRIP1, CDH1, CDK4, CDKN2A (p14ARF), CDKN2A (p16INK4a), CHEK2, CTNNA1, DICER1, EPCAM (Deletion/duplication testing only), GREM1 (promoter region deletion/duplication testing only), KIT, MEN1, MLH1, MSH2, MSH3, MSH6, MUTYH, NBN, NF1, NHTL1, PALB2, PDGFRA, PMS2, POLD1, POLE, PTEN, RAD50, RAD51C, RAD51D, SDHB, SDHC, SDHD, SMAD4, SMARCA4. STK11, TP53, TSC1, TSC2, and VHL.  The following genes were evaluated for sequence changes only: SDHA and HOXB13 c.251G>A variant only. The report date is 08-29-2018.   08/24/2018 Breast MRI   Bilateral breast MRI with and without contrast.  Right breast: No suspicious mass or abnormal  enhancement. Benign intramammary lymph nodes in the outer right breast.   Left breast: In the posterior third of the lower inner quadrant of the left breast is an irregular enhancing mass spanning approximately 1.3 x 0.8 x 0.5 cm with internal biopsy clip artifact and washout kinetics, corresponding to the   biopsy-proven malignancy. The mass/enhancement do not involve the pectoralis muscle. Benign intramammary lymph node in the middle third of the outer left breast is mammographically stable. There is more glandular tissue in the left breast on the right, corresponding with chronic appearance of mammograms dating back to at least 2010.  Lymph nodes: No abnormal appearing lymph nodes.   09/01/2018 Surgery   Left lumpectomy [name]) [phone]): IDC, nottingham grade 2 of 3, 1.1 cm. Margins uninvolved. 0/3 lymph nodes positive for carcinoma. Pathologic stage pT1c pN0.   09/13/2018 Cancer Staging   Staging form: Breast, AJCC 8th Edition - Pathologic: Stage IB (pT1c, pN0, cM0, G2, ER-, PR-, HER2-) - Signed by [name], NP on 09/13/2018   10/24/2018 - 04/05/2019 Chemotherapy   Patient is on Treatment Plan : BREAST Adjuvant CMF IV q21d     12/05/2018 - 01/19/2019 Radiation Therapy   [name]) The patient initially received a dose of 50.4 Gy in 28 fractions to the breast using whole-breast tangent fields. This was delivered using a 3-D conformal technique. The patient then received a boost to the seroma. This delivered an additional 10 Gy in 5 fractions using a 3 field photon boost technique. The total dose was 60.4 Gy.     CHIEF COMPLIANT: Observation Establish oncology care with Dr. Pamelia Hoit   INTERVAL HISTORY: Gloria Mccoy is a 77 y.o. female returns for follow-up of her triple negative breast cancer.    ALLERGIES:  is allergic to aloe vera, sulfa antibiotics,  and sulfasalazine.  MEDICATIONS:  Current Outpatient Medications  Medication Sig Dispense Refill   acetaminophen  (TYLENOL) 325 MG tablet Take 2 tablets (650 mg total) by mouth every 6 (six) hours as needed for mild pain.     aspirin EC 81 MG tablet Take 81 mg by mouth daily.     atorvastatin (LIPITOR) 80 MG tablet TAKE 1 TABLET BY MOUTH ONCE DAILY AT  6PM 90 tablet 3   Cholecalciferol (VITAMIN D PO) Take 1 tablet by mouth daily.      Cyanocobalamin (VITAMIN B-12 PO) Take 1 tablet by mouth daily.     fish oil-omega-3 fatty acids 1000 MG capsule Take 1 g by mouth daily.     furosemide (LASIX) 40 MG tablet Take 40 mg by mouth daily.     levothyroxine (SYNTHROID) 25 MCG tablet Take 25 mcg by mouth daily.     levothyroxine (SYNTHROID, LEVOTHROID) 75 MCG tablet Take 75 mcg by mouth daily.     losartan (COZAAR) 25 MG tablet TAKE 1/2 TABLET EVERY DAY 45 tablet 3   metoprolol tartrate (LOPRESSOR) 25 MG tablet TAKE 1 TABLET TWICE DAILY 180 tablet 3   nitroGLYCERIN (NITROSTAT) 0.4 MG SL tablet Place 0.4 mg under the tongue every 5 (five) minutes x 3 doses as needed for chest pain (if no relief after 3rd dose, proceed to ED or call 911).     vitamin E 100 UNIT capsule Take 1 capsule (100 Units total) by mouth daily. 30 capsule 0   No current facility-administered medications for this visit.    PHYSICAL EXAMINATION: ECOG PERFORMANCE STATUS: {CHL ONC ECOG [phone]}  There were no vitals filed for this visit. There were no vitals filed for this visit.  BREAST:*** No palpable masses or nodules in either right or left breasts. No palpable axillary supraclavicular or infraclavicular adenopathy no breast tenderness or nipple discharge. (exam performed in the presence of a chaperone)  LABORATORY DATA:  I have reviewed the data as listed    Latest Ref Rng & Units 09/24/2021    9:25 AM 11/12/2020   12:10 PM 10/16/2020    8:20 AM  CMP  Glucose 70 - 99 mg/dL 433   295   BUN 8 - 23 mg/dL 14   10   Creatinine 1.88 - 1.00 mg/dL 4.16   6.06   Sodium 301 - 145 mmol/L 140   143   Potassium 3.5 - 5.1 mmol/L 3.7   4.1    Chloride 98 - 111 mmol/L 104   107   CO2 22 - 32 mmol/L 30   29   Calcium 8.9 - 10.3 mg/dL 9.0   8.9   Total Protein 6.5 - 8.1 g/dL 7.5  6.7  7.2   Total Bilirubin 0.3 - 1.2 mg/dL 0.8   0.9   Alkaline Phos 38 - 126 U/L 71   85   AST 15 - 41 U/L 16   21   ALT 0 - 44 U/L 13   18     Lab Results  Component Value Date   WBC 7.9 09/24/2021   HGB 13.9 09/24/2021   HCT 38.6 09/24/2021   MCV 82.8 09/24/2021   PLT 260 09/24/2021   NEUTROABS 3.6 09/24/2021    ASSESSMENT & PLAN:  No problem-specific Assessment & Plan notes found for this encounter.    No orders of the defined types were placed in this encounter.  The patient has a good understanding of the overall plan. she agrees with it.  she will call with any problems that may develop before the next visit here. Total time spent: 30 mins including face to face time and time spent for planning, charting and co-ordination of care   Sherlyn Lick, CMA 09/27/22    I Janan Ridge am acting as a Neurosurgeon for [employer]  ***

## 2022-09-28 NOTE — Assessment & Plan Note (Signed)
stage Ib triple negative breast cancer diagnosed in February 2020 status post left lumpectomy, adjuvant chemotherapy, and adjuvant radiation.   Breast Cancer Surveillance: Breast Exam: 09/29/22: Benign, slight rash underneath the breast Mammogram: 12/08/21: Benign Density Cat B  Recommended that she exercise 30 minutes 5 days a week. RTC in 1 year for follow-up and after that she could be seen on an as-needed basis.

## 2022-09-29 ENCOUNTER — Inpatient Hospital Stay: Payer: Medicare HMO | Attending: Hematology and Oncology | Admitting: Hematology and Oncology

## 2022-09-29 ENCOUNTER — Other Ambulatory Visit: Payer: Self-pay

## 2022-09-29 VITALS — BP 164/62 | HR 61 | Temp 97.8°F | Resp 18 | Ht 62.0 in | Wt 207.8 lb

## 2022-09-29 DIAGNOSIS — Z171 Estrogen receptor negative status [ER-]: Secondary | ICD-10-CM | POA: Diagnosis not present

## 2022-09-29 DIAGNOSIS — Z882 Allergy status to sulfonamides status: Secondary | ICD-10-CM | POA: Diagnosis not present

## 2022-09-29 DIAGNOSIS — Z79899 Other long term (current) drug therapy: Secondary | ICD-10-CM | POA: Insufficient documentation

## 2022-09-29 DIAGNOSIS — C50512 Malignant neoplasm of lower-outer quadrant of left female breast: Secondary | ICD-10-CM | POA: Insufficient documentation

## 2022-12-09 ENCOUNTER — Other Ambulatory Visit: Payer: Self-pay | Admitting: Cardiology

## 2022-12-10 ENCOUNTER — Ambulatory Visit (HOSPITAL_COMMUNITY)
Admission: RE | Admit: 2022-12-10 | Discharge: 2022-12-10 | Disposition: A | Discharge status: Home / Self Care | Payer: Medicare HMO | Source: Ambulatory Visit | Attending: Internal Medicine | Admitting: Internal Medicine

## 2022-12-10 ENCOUNTER — Encounter (HOSPITAL_COMMUNITY): Payer: Self-pay

## 2022-12-10 DIAGNOSIS — Z1231 Encounter for screening mammogram for malignant neoplasm of breast: Secondary | ICD-10-CM

## 2022-12-23 ENCOUNTER — Other Ambulatory Visit: Payer: Self-pay | Admitting: Cardiology

## 2023-01-29 ENCOUNTER — Ambulatory Visit
Admission: EM | Admit: 2023-01-29 | Discharge: 2023-01-29 | Disposition: A | Discharge status: Home / Self Care | Payer: Medicare HMO | Source: Home / Self Care

## 2023-01-29 DIAGNOSIS — M79604 Pain in right leg: Secondary | ICD-10-CM

## 2023-01-29 DIAGNOSIS — R600 Localized edema: Secondary | ICD-10-CM

## 2023-01-29 MED ORDER — DEXAMETHASONE SODIUM PHOSPHATE 10 MG/ML IJ SOLN
10.0000 mg | Freq: Once | INTRAMUSCULAR | Status: AC
  Administered 2023-01-29: 10 mg via INTRAMUSCULAR

## 2023-01-29 NOTE — ED Triage Notes (Addendum)
Pt c/o right foot pain (intermittent shooting) that is moving up calf. The patient has some swelling to her right foot. Pt denies chest pain/ and denies SHOB. There is pitting edema to the RLE.   Started: 1 week ago

## 2023-01-29 NOTE — Discharge Instructions (Signed)
We have given you a steroid shot today to see if your ankle and lower leg pain are inflammatory, possibly arthritis versus gout.  If you are not improving call [phone] on Monday to schedule the ultrasound that I put in to rule out a blood clot.  Follow-up with your primary care provider for a recheck.  If worsening at any time, go to the emergency department.

## 2023-01-29 NOTE — ED Provider Notes (Signed)
[facility]    CSN: [id number] Arrival date & time: 01/29/23  1130      History   Chief Complaint Chief Complaint  Patient presents with   Foot Pain    HPI Gloria Mccoy is a 77 y.o. female.   Patient presenting today with about a week of pain that initially started in the base of the right great toe and is now moving up to the dorsal foot, ankle and into the posterior calf region.  Some mild hyperpigmentation to these areas but no numbness, tingling, loss of range of motion, known injury, fever, chills, sweats, chest pain, shortness of breath, palpitations.  So far has not tried anything over-the-counter for symptoms.  No risk factors for DVT to include recent long travel, new sedentary behavior, recent surgery, use of hormones.  Does have a known history of vascular disease in this lower extremity and has had to have a vein removed in the past per patient.  No past history of similar issues.    Past Medical History:  Diagnosis Date   Arthritis    "hands sometimes" (07/13/2017)   Coronary artery disease    a. s/p CABG x3 in 06/2017 with LIMA-LAD, SVG-D1, and SVG-RI.  b. 10/2017: cath showing a widely patent LIMA-LAD with ostial occlusion of the SVG-RI and subtotally occluded atretic SVG-D1. Graft occlusion thought to be 2ry to improvement in pre-CABG stenoses.    Essential hypertension    Family history of adverse reaction to anesthesia    sister had a complication that was stated she had a "foggy" episode after her breast surgery was readmitted 1 day post op after being discharged from the hospital. Sister also has significant lung problems that could have contributed to this    Family history of breast cancer    Family history of colon cancer    Hypothyroid    Meniere disease    Myocardial infarction (HCC) 06/2017   during [facility]   Obesity    Osteopenia 01/2012   T score -1.3 FRAX 7.9%/0.6%   Personal history of chemotherapy 2021   Personal history of  radiation therapy 2021   left breast    Patient Active Problem List   Diagnosis Date Noted   Port-A-Cath in place 01/16/2019   Genetic testing 08/29/2018   Family history of breast cancer    Family history of colon cancer    Malignant neoplasm of lower-outer quadrant of left breast of female, estrogen receptor negative (HCC) 08/16/2018   NSTEMI (non-ST elevated myocardial infarction) (HCC) 11/02/2017   S/P CABG x 3 07/15/2017   Coronary artery disease 07/15/2017   Unstable angina (HCC) 07/13/2017   Essential hypertension 07/13/2017   Obesity 07/13/2017   Hyperglycemia 07/13/2017   Hypothyroid 01/06/2012   Meniere disease    Arthritis     Past Surgical History:  Procedure Laterality Date   BREAST LUMPECTOMY WITH RADIOACTIVE SEED AND SENTINEL LYMPH NODE BIOPSY Left 09/01/2018   Procedure: LEFT BREAST LUMPECTOMY WITH RADIOACTIVE SEED AND SENTINEL LYMPH NODE BIOPSY;  Surgeon: Griselda Miner, MD;  Location: [facility] OR;  Service: General;  Laterality: Left;   BREAST SURGERY Left 2020   INVASIVE DUCTAL CARCINOMA/DUCTAL CARCINOMA IN SITU/MARGINS UNINVOLVED   CARDIAC CATHETERIZATION  07/13/2017   CATARACT EXTRACTION W/PHACO Right 01/28/2015   Procedure: CATARACT EXTRACTION PHACO AND INTRAOCULAR LENS PLACEMENT :  CDE:  5.70;  Surgeon: Jethro Bolus, MD;  Location: [facility]S;  Service: Ophthalmology;  Laterality: Right;   CATARACT EXTRACTION W/PHACO Left  02/11/2015   Procedure: CATARACT EXTRACTION PHACO AND INTRAOCULAR LENS PLACEMENT (IOC);  Surgeon: Jethro Bolus, MD;  Location: [facility];  Service: Ophthalmology;  Laterality: Left;  CDE: 7.38   COLONOSCOPY N/A 09/26/2012   Procedure: COLONOSCOPY;  Surgeon: Dalia Heading, MD;  Location: [facility];  Service: Gastroenterology;  Laterality: N/A;   CORONARY ARTERY BYPASS GRAFT N/A 07/15/2017   Procedure: CORONARY ARTERY BYPASS GRAFTING (CABG) X 3 USING LEFT INTERNAL MAMMARY ARTERY AND RIGHT SAPHENOUS VEIN- ENDOSCOPICALLY HARVESTED;  Surgeon:  Loreli Slot, MD;  Location: [facility] OR;  Service: Open Heart Surgery;  Laterality: N/A;   IR IMAGING GUIDED PORT INSERTION  12/29/2018   IR REMOVAL TUN ACCESS W/ PORT W/O FL MOD SED  04/26/2019   LEFT HEART CATH AND CORONARY ANGIOGRAPHY N/A 07/13/2017   Procedure: LEFT HEART CATH AND CORONARY ANGIOGRAPHY;  Surgeon: Swazil[place], Peter M, MD;  Location: [facility] INVASIVE CV LAB;  Service: Cardiovascular;  Laterality: N/A;   LEFT HEART CATH AND CORS/GRAFTS ANGIOGRAPHY N/A 11/03/2017   Procedure: LEFT HEART CATH AND CORS/GRAFTS ANGIOGRAPHY;  Surgeon: Lennette Bihari, MD;  Location: [facility] INVASIVE CV LAB;  Service: Cardiovascular;  Laterality: N/A;   TEE WITHOUT CARDIOVERSION N/A 07/15/2017   Procedure: TRANSESOPHAGEAL ECHOCARDIOGRAM (TEE);  Surgeon: Loreli Slot, MD;  Location: [facility] OR;  Service: Open Heart Surgery;  Laterality: N/A;   TUBAL LIGATION     TYMPANOPLASTY Left    fluid from ear drum    OB History     Gravida  2   Para  2   Term  2   Preterm  0   AB  0   Living         SAB  0   IAB  0   Ectopic  0   Multiple      Live Births               Home Medications    Prior to Admission medications   Medication Sig Start Date End Date Taking? Authorizing Provider  AREXVY 120 MCG/0.5ML injection  05/24/22   [provider]  aspirin EC 81 MG tablet Take 81 mg by mouth daily.    [provider]  atorvastatin (LIPITOR) 80 MG tablet TAKE 1 TABLET BY MOUTH ONCE DAILY AT [facility] 12/09/22   Antoine Poche, MD  Cholecalciferol (VITAMIN D PO) Take 1 tablet by mouth daily.     [provider]  Cyanocobalamin (VITAMIN B-12 PO) Take 1 tablet by mouth daily.    [provider]  fish oil-omega-3 fatty acids 1000 MG capsule Take 1 g by mouth daily.    [provider]  furosemide (LASIX) 40 MG tablet Take 40 mg by mouth daily.    [provider]  levothyroxine (SYNTHROID) 25 MCG tablet Take 25 mcg by mouth daily. 07/15/21    [provider]  levothyroxine (SYNTHROID, LEVOTHROID) 75 MCG tablet Take 75 mcg by mouth daily.    [provider]  losartan (COZAAR) 25 MG tablet TAKE 1/2 TABLET EVERY DAY 03/31/22   Antoine Poche, MD  metoprolol tartrate (LOPRESSOR) 25 MG tablet TAKE 1 TABLET TWICE DAILY 12/24/22   Antoine Poche, MD  nitroGLYCERIN (NITROSTAT) 0.4 MG SL tablet Place 0.4 mg under the tongue every 5 (five) minutes x 3 doses as needed for chest pain (if no relief after 3rd dose, proceed to ED or call 911).    [provider]  omega-3 acid ethyl esters (LOVAZA) 1 g  capsule Take by mouth. 09/07/16   [provider]  [facility] syringe  04/07/22   [provider]  vitamin E 100 UNIT capsule Take 1 capsule (100 Units total) by mouth daily. 03/15/19   [name], NP    Family History Family History  Problem Relation Age of Onset   Diabetes Sister        AODM   Heart disease Sister 69       CABG   Breast cancer Sister    Heart disease Brother 51       In his 70s   Colon cancer Maternal Uncle 88   Diabetes Sister        AODM   Hypertension Daughter    Hypertension Daughter    Heart attack Father        In his 22s   Stroke Mother    Stroke Maternal Grandmother    Diabetes Maternal Grandfather        d. 109   Heart attack Paternal Grandmother    Colon cancer Maternal Uncle 69   Breast cancer Cousin        dx 15s; d. 43s   Breast cancer Niece 55    Social History Social History   Tobacco Use   Smoking status: Never   Smokeless tobacco: Former    Types: Snuff    Quit date: 07/31/2017  Vaping Use   Vaping status: Never Used  Substance Use Topics   Alcohol use: Not Currently    Alcohol/week: 1.0 standard drink of alcohol    Types: 1 Standard drinks or equivalent per week    Comment: occasionally   Drug use: No     Allergies   Aloe vera, Sulfa antibiotics, and Sulfasalazine   Review of Systems Review of Systems Per  HPI  Physical Exam Triage Vital Signs ED Triage Vitals  Encounter Vitals Group     BP 01/29/23 1209 (!) 154/75     Systolic BP Percentile --      Diastolic BP Percentile --      Pulse Rate 01/29/23 1209 (!) 56     Resp 01/29/23 1209 18     Temp 01/29/23 1209 97.6 F (36.4 C)     Temp Source 01/29/23 1209 Oral     SpO2 01/29/23 1209 95 %     Weight --      Height --      Head Circumference --      Peak Flow --      Pain Score 01/29/23 1208 8     Pain Loc --      Pain Education --      Exclude from Growth Chart --    No data found.  Updated Vital Signs BP (!) 154/75 (BP Location: Right Arm)   Pulse (!) 56   Temp 97.6 F (36.4 C) (Oral)   Resp 18   SpO2 95%   Visual Acuity Right Eye Distance:   Left Eye Distance:   Bilateral Distance:    Right Eye Near:   Left Eye Near:    Bilateral Near:     Physical Exam Vitals and nursing note reviewed.  Constitutional:      Appearance: Normal appearance. She is not ill-appearing.  HENT:     Head: Atraumatic.  Eyes:     Extraocular Movements: Extraocular movements intact.     Conjunctiva/sclera: Conjunctivae normal.  Cardiovascular:     Rate and Rhythm: Normal rate and regular rhythm.  Heart sounds: Normal heart sounds.  Pulmonary:     Effort: Pulmonary effort is normal.     Breath sounds: Normal breath sounds.  Musculoskeletal:        General: Swelling and tenderness present. No signs of injury. Normal range of motion.     Cervical back: Normal range of motion and neck supple.     Comments: Edema to the right foot, lower leg and mild tenderness to palpation diffusely.  Negative Homans' sign, negative squeeze test  Skin:    General: Skin is warm and dry.     Comments: Slight hyperpigmentation to the right great toe base, medial ankle and right lower leg distally  Neurological:     Mental Status: She is alert and oriented to person, place, and time.     Comments: Right lower extremity neurovascularly intact   Psychiatric:        Mood and Affect: Mood normal.        Thought Content: Thought content normal.        Judgment: Judgment normal.      UC Treatments / Results  Labs (all labs ordered are listed, but only abnormal results are displayed) Labs Reviewed - No data to display  EKG   Radiology No results found.  Procedures Procedures (including critical care time)  Medications Ordered in UC Medications  dexamethasone (DECADRON) injection 10 mg (10 mg Intramuscular Given 01/29/23 1252)    Initial Impression / Assessment and Plan / UC Course  I have reviewed the triage vital signs and the nursing notes.  Pertinent labs & imaging results that were available during my care of the patient were reviewed by me and considered in my medical decision making (see chart for details).     Do suspect more of an inflammatory cause such as gout or arthritis flare so we will treat with IM Decadron, compression stocking, elevation and over-the-counter pain relievers but we will also order ultrasound rule out DVT.  This is unfortunately unable to be scheduled over the weekend but information given for her to call Monday morning to get this scheduled and she is aware to go to the emergency department if symptoms worsen in the meantime.  Lower suspicion for this at this time.  Final Clinical Impressions(s) / UC Diagnoses   Final diagnoses:  Leg edema, right  Pain of right lower extremity     Discharge Instructions      We have given you a steroid shot today to see if your ankle and lower leg pain are inflammatory, possibly arthritis versus gout.  If you are not improving call [phone] on Monday to schedule the ultrasound that I put in to rule out a blood clot.  Follow-up with your primary care provider for a recheck.  If worsening at any time, go to the emergency department.    ED Prescriptions   None    PDMP not reviewed this encounter.   Particia Nearing, [place] 01/29/23  1258

## 2023-02-17 DIAGNOSIS — M1991 Primary osteoarthritis, unspecified site: Secondary | ICD-10-CM | POA: Diagnosis not present

## 2023-02-17 DIAGNOSIS — M47812 Spondylosis without myelopathy or radiculopathy, cervical region: Secondary | ICD-10-CM | POA: Diagnosis not present

## 2023-02-17 DIAGNOSIS — Z1331 Encounter for screening for depression: Secondary | ICD-10-CM | POA: Diagnosis not present

## 2023-02-17 DIAGNOSIS — Z6835 Body mass index (BMI) 35.0-35.9, adult: Secondary | ICD-10-CM | POA: Diagnosis not present

## 2023-02-17 DIAGNOSIS — E6609 Other obesity due to excess calories: Secondary | ICD-10-CM | POA: Diagnosis not present

## 2023-02-17 DIAGNOSIS — E559 Vitamin D deficiency, unspecified: Secondary | ICD-10-CM | POA: Diagnosis not present

## 2023-02-17 DIAGNOSIS — D518 Other vitamin B12 deficiency anemias: Secondary | ICD-10-CM | POA: Diagnosis not present

## 2023-02-17 DIAGNOSIS — Z0001 Encounter for general adult medical examination with abnormal findings: Secondary | ICD-10-CM | POA: Diagnosis not present

## 2023-02-17 DIAGNOSIS — E039 Hypothyroidism, unspecified: Secondary | ICD-10-CM | POA: Diagnosis not present

## 2023-02-22 ENCOUNTER — Encounter (INDEPENDENT_AMBULATORY_CARE_PROVIDER_SITE_OTHER): Payer: Self-pay | Admitting: *Deleted

## 2023-03-08 ENCOUNTER — Ambulatory Visit
Admission: EM | Admit: 2023-03-08 | Discharge: 2023-03-08 | Disposition: A | Discharge status: Home / Self Care | Payer: Medicare HMO

## 2023-03-08 DIAGNOSIS — M5410 Radiculopathy, site unspecified: Secondary | ICD-10-CM | POA: Diagnosis not present

## 2023-03-08 MED ORDER — DEXAMETHASONE SODIUM PHOSPHATE 10 MG/ML IJ SOLN
10.0000 mg | Freq: Once | INTRAMUSCULAR | Status: AC
  Administered 2023-03-08: 10 mg via INTRAMUSCULAR

## 2023-03-08 NOTE — ED Provider Notes (Signed)
[facility]    CSN: [id number] Arrival date & time: 03/08/23  1515      History   Chief Complaint No chief complaint on file.   HPI Gloria Mccoy is a 77 y.o. female.   Patient presenting today with several week history of pain to the posterior neck and head, radiating down to the upper back, left shoulder and down her left arm toward the elbow.  Some numbness and tingling to the upper arm.  Denies injury to the area, decreased range of motion to the neck or shoulder, weakness in the left arm, mental status changes, swelling, discoloration.  Trying Robaxin from PCP for the past month with no improvement.    Past Medical History:  Diagnosis Date   Arthritis    "hands sometimes" (07/13/2017)   Coronary artery disease    a. s/p CABG x3 in 06/2017 with LIMA-LAD, SVG-D1, and SVG-RI.  b. 10/2017: cath showing a widely patent LIMA-LAD with ostial occlusion of the SVG-RI and subtotally occluded atretic SVG-D1. Graft occlusion thought to be 2ry to improvement in pre-CABG stenoses.    Essential hypertension    Family history of adverse reaction to anesthesia    sister had a complication that was stated she had a "foggy" episode after her breast surgery was readmitted 1 day post op after being discharged from the hospital. Sister also has significant lung problems that could have contributed to this    Family history of breast cancer    Family history of colon cancer    Hypothyroid    Meniere disease    Myocardial infarction (HCC) 06/2017   during [facility]   Obesity    Osteopenia 01/2012   T score -1.3 FRAX 7.9%/0.6%   Personal history of chemotherapy 2021   Personal history of radiation therapy 2021   left breast    Patient Active Problem List   Diagnosis Date Noted   Port-A-Cath in place 01/16/2019   Genetic testing 08/29/2018   Family history of breast cancer    Family history of colon cancer    Malignant neoplasm of lower-outer quadrant of left breast of  female, estrogen receptor negative (HCC) 08/16/2018   NSTEMI (non-ST elevated myocardial infarction) (HCC) 11/02/2017   S/P CABG x 3 07/15/2017   Coronary artery disease 07/15/2017   Unstable angina (HCC) 07/13/2017   Essential hypertension 07/13/2017   Obesity 07/13/2017   Hyperglycemia 07/13/2017   Hypothyroid 01/06/2012   Meniere disease    Arthritis     Past Surgical History:  Procedure Laterality Date   BREAST LUMPECTOMY WITH RADIOACTIVE SEED AND SENTINEL LYMPH NODE BIOPSY Left 09/01/2018   Procedure: LEFT BREAST LUMPECTOMY WITH RADIOACTIVE SEED AND SENTINEL LYMPH NODE BIOPSY;  Surgeon: Griselda Miner, MD;  Location: [facility] OR;  Service: General;  Laterality: Left;   BREAST SURGERY Left 2020   INVASIVE DUCTAL CARCINOMA/DUCTAL CARCINOMA IN SITU/MARGINS UNINVOLVED   CARDIAC CATHETERIZATION  07/13/2017   CATARACT EXTRACTION W/PHACO Right 01/28/2015   Procedure: CATARACT EXTRACTION PHACO AND INTRAOCULAR LENS PLACEMENT :  CDE:  5.70;  Surgeon: Jethro Bolus, MD;  Location: [facility];  Service: Ophthalmology;  Laterality: Right;   CATARACT EXTRACTION W/PHACO Left 02/11/2015   Procedure: CATARACT EXTRACTION PHACO AND INTRAOCULAR LENS PLACEMENT (IOC);  Surgeon: Jethro Bolus, MD;  Location: [facility];  Service: Ophthalmology;  Laterality: Left;  CDE: 7.38   COLONOSCOPY N/A 09/26/2012   Procedure: COLONOSCOPY;  Surgeon: Dalia Heading, MD;  Location: [facility];  Service: Gastroenterology;  Laterality: N/A;   CORONARY ARTERY BYPASS GRAFT N/A 07/15/2017   Procedure: CORONARY ARTERY BYPASS GRAFTING (CABG) X 3 USING LEFT INTERNAL MAMMARY ARTERY AND RIGHT SAPHENOUS VEIN- ENDOSCOPICALLY HARVESTED;  Surgeon: Loreli Slot, MD;  Location: [facility] OR;  Service: Open Heart Surgery;  Laterality: N/A;   IR IMAGING GUIDED PORT INSERTION  12/29/2018   IR REMOVAL TUN ACCESS W/ PORT W/O FL MOD SED  04/26/2019   LEFT HEART CATH AND CORONARY ANGIOGRAPHY N/A 07/13/2017   Procedure: LEFT HEART CATH AND CORONARY  ANGIOGRAPHY;  Surgeon: Swazil[place], Peter M, MD;  Location: [facility] INVASIVE CV LAB;  Service: Cardiovascular;  Laterality: N/A;   LEFT HEART CATH AND CORS/GRAFTS ANGIOGRAPHY N/A 11/03/2017   Procedure: LEFT HEART CATH AND CORS/GRAFTS ANGIOGRAPHY;  Surgeon: Lennette Bihari, MD;  Location: [facility] INVASIVE CV LAB;  Service: Cardiovascular;  Laterality: N/A;   TEE WITHOUT CARDIOVERSION N/A 07/15/2017   Procedure: TRANSESOPHAGEAL ECHOCARDIOGRAM (TEE);  Surgeon: Loreli Slot, MD;  Location: [facility] OR;  Service: Open Heart Surgery;  Laterality: N/A;   TUBAL LIGATION     TYMPANOPLASTY Left    fluid from ear drum    OB History     Gravida  2   Para  2   Term  2   Preterm  0   AB  0   Living         SAB  0   IAB  0   Ectopic  0   Multiple      Live Births               Home Medications    Prior to Admission medications   Medication Sig Start Date End Date Taking? Authorizing Provider  methocarbamol (ROBAXIN) 500 MG tablet SMARTSIG:1 Tablet(s) By Mouth 1-3 Times Daily 02/17/23  Yes [provider]  AREXVY 120 MCG/0.5ML injection  05/24/22   [provider]  aspirin EC 81 MG tablet Take 81 mg by mouth daily.    [provider]  atorvastatin (LIPITOR) 80 MG tablet TAKE 1 TABLET BY MOUTH ONCE DAILY AT [facility] 12/09/22   Antoine Poche, MD  Cholecalciferol (VITAMIN D PO) Take 1 tablet by mouth daily.     [provider]  Cyanocobalamin (VITAMIN B-12 PO) Take 1 tablet by mouth daily.    [provider]  fish oil-omega-3 fatty acids 1000 MG capsule Take 1 g by mouth daily.    [provider]  furosemide (LASIX) 40 MG tablet Take 40 mg by mouth daily.    [provider]  levothyroxine (SYNTHROID) 25 MCG tablet Take 25 mcg by mouth daily. 07/15/21   [provider]  levothyroxine (SYNTHROID, LEVOTHROID) 75 MCG tablet Take 75 mcg by mouth daily.    [provider]  losartan (COZAAR) 25 MG tablet TAKE 1/2 TABLET  EVERY DAY 03/31/22   Antoine Poche, MD  metoprolol tartrate (LOPRESSOR) 25 MG tablet TAKE 1 TABLET TWICE DAILY 12/24/22   Antoine Poche, MD  nitroGLYCERIN (NITROSTAT) 0.4 MG SL tablet Place 0.4 mg under the tongue every 5 (five) minutes x 3 doses as needed for chest pain (if no relief after 3rd dose, proceed to ED or call 911).    [provider]  omega-3 acid ethyl esters (LOVAZA) 1 g capsule Take by mouth. 09/07/16   [provider]  [facility] syringe  04/07/22   [provider]  vitamin E 100 UNIT capsule Take 1 capsule (100 Units total) by mouth daily.  03/15/19   [name], NP    Family History Family History  Problem Relation Age of Onset   Diabetes Sister        AODM   Heart disease Sister 58       CABG   Breast cancer Sister    Heart disease Brother 53       In his 36s   Colon cancer Maternal Uncle 59   Diabetes Sister        AODM   Hypertension Daughter    Hypertension Daughter    Heart attack Father        In his 38s   Stroke Mother    Stroke Maternal Grandmother    Diabetes Maternal Grandfather        d. 109   Heart attack Paternal Grandmother    Colon cancer Maternal Uncle 29   Breast cancer Cousin        dx 1s; d. 52s   Breast cancer Niece 28    Social History Social History   Tobacco Use   Smoking status: Never   Smokeless tobacco: Former    Types: Snuff    Quit date: 07/31/2017  Vaping Use   Vaping status: Never Used  Substance Use Topics   Alcohol use: Not Currently    Alcohol/week: 1.0 standard drink of alcohol    Types: 1 Standard drinks or equivalent per week    Comment: occasionally   Drug use: No     Allergies   Aloe vera, Sulfa antibiotics, and Sulfasalazine   Review of Systems Review of Systems PER HPI  Physical Exam Triage Vital Signs ED Triage Vitals  Encounter Vitals Group     BP 03/08/23 1614 (!) 165/82     Systolic BP Percentile --      Diastolic BP Percentile --      Pulse  Rate 03/08/23 1614 (!) 59     Resp 03/08/23 1614 20     Temp 03/08/23 1614 98.2 F (36.8 C)     Temp Source 03/08/23 1614 Oral     SpO2 03/08/23 1614 92 %     Weight --      Height --      Head Circumference --      Peak Flow --      Pain Score 03/08/23 1615 10     Pain Loc --      Pain Education --      Exclude from Growth Chart --    No data found.  Updated Vital Signs BP (!) 165/82 (BP Location: Right Arm)   Pulse (!) 59   Temp 98.2 F (36.8 C) (Oral)   Resp 20   SpO2 92%   Visual Acuity Right Eye Distance:   Left Eye Distance:   Bilateral Distance:    Right Eye Near:   Left Eye Near:    Bilateral Near:     Physical Exam Vitals and nursing note reviewed.  Constitutional:      Appearance: Normal appearance. She is not ill-appearing.  HENT:     Head: Atraumatic.  Eyes:     Extraocular Movements: Extraocular movements intact.     Conjunctiva/sclera: Conjunctivae normal.  Cardiovascular:     Rate and Rhythm: Normal rate and regular rhythm.     Heart sounds: Normal heart sounds.  Pulmonary:     Effort: Pulmonary effort is normal.     Breath sounds: Normal breath sounds.  Musculoskeletal:  General: Tenderness present. No swelling or signs of injury. Normal range of motion.     Cervical back: Normal range of motion and neck supple.     Comments: Tender to palpation bilateral cervical paraspinal musculature extending to the left trapezius and deltoid.  Range of motion intact, grip strength full and equal bilaterally  Skin:    General: Skin is warm and dry.     Findings: No bruising or erythema.  Neurological:     Mental Status: She is alert and oriented to person, place, and time.     Comments: Bilateral upper extremities neurovascularly intact  Psychiatric:        Mood and Affect: Mood normal.        Thought Content: Thought content normal.        Judgment: Judgment normal.      UC Treatments / Results  Labs (all labs ordered are listed, but only  abnormal results are displayed) Labs Reviewed - No data to display  EKG   Radiology No results found.  Procedures Procedures (including critical care time)  Medications Ordered in UC Medications  dexamethasone (DECADRON) injection 10 mg (10 mg Intramuscular Given 03/08/23 1643)    Initial Impression / Assessment and Plan / UC Course  I have reviewed the triage vital signs and the nursing notes.  Pertinent labs & imaging results that were available during my care of the patient were reviewed by me and considered in my medical decision making (see chart for details).     Consistent with a impingement/radicular type pain.  Treat with IM Decadron, continued muscle relaxers as needed, heat, stretches, massage and close PCP follow-up for recheck.  Return for worsening symptoms.  Final Clinical Impressions(s) / UC Diagnoses   Final diagnoses:  Radiculopathy affecting upper extremity     Discharge Instructions      Continue muscle relaxers as needed, we have given you a steroid shot today to help with pain and inflammation. Use heat and neck wraps, massage, stretches, Tylenol as needed additionally.    ED Prescriptions   None    PDMP not reviewed this encounter.   Particia Nearing, [place] 03/08/23 1713

## 2023-03-08 NOTE — ED Triage Notes (Signed)
Pt reports she has pain in the back of her head, across her shoulder and left arm to her elbow that is tingling and numb x 3 weeks.    States robaxin is not helping

## 2023-03-08 NOTE — Discharge Instructions (Signed)
Continue muscle relaxers as needed, we have given you a steroid shot today to help with pain and inflammation. Use heat and neck wraps, massage, stretches, Tylenol as needed additionally.

## 2023-03-09 ENCOUNTER — Ambulatory Visit: Payer: Medicare HMO | Attending: Cardiology | Admitting: Cardiology

## 2023-03-09 ENCOUNTER — Encounter: Payer: Self-pay | Admitting: Cardiology

## 2023-03-09 VITALS — BP 134/70 | HR 60 | Ht 62.0 in | Wt 205.4 lb

## 2023-03-09 DIAGNOSIS — I10 Essential (primary) hypertension: Secondary | ICD-10-CM | POA: Diagnosis not present

## 2023-03-09 DIAGNOSIS — E782 Mixed hyperlipidemia: Secondary | ICD-10-CM

## 2023-03-09 DIAGNOSIS — I25118 Atherosclerotic heart disease of native coronary artery with other forms of angina pectoris: Secondary | ICD-10-CM | POA: Diagnosis not present

## 2023-03-09 NOTE — Patient Instructions (Addendum)

## 2023-03-09 NOTE — Progress Notes (Signed)
Clinical Summary Ms. Pehl is a 77 y.o.female seen today for follow up of the following medical problems.    1. CAD - admit Jan 2019 with unstable angina. - Jan 2019 with distal LM 80%, prox LAD 90%. She was referred for CABG/ s/p LIMA-LAD, SVG-D1, SVG-RI - Jan 2019 echo LVEF 55-60%   - 10/2017 admitted with chest pain. Admitted with NSTEMI - 10/2017 cath with ostial LAD 45%, mid LAD 80%, D1 40%, ramus 20%, LCX 25%, RCA patent. LIMA-LAD patent, SVG-ramus occluded, SVG-D1 99%. Recs for medical therapy, discharged on DAPT with plans for 1 year of treatment.      - episode in August, midchest. Aching pain, 6/10 in severity. Lasted just a few seconds. Some associated SOB. Took NG, pain resolved. Isolated episode. Occurred when got upset when commode was overflowing - compliant with meds     2. Hyperlipidemia  - compliant with statin - recent labs followed by pcp   01/2022 TC 81 TG 82 HDL 30 LDL 34 - 01/2023 TC 90 TG 80 HDL 36 LDL 37   3. HTN - she is compliant with meds     4. Breast cancer - s/p lumpectomy -she is on chemoradiation, l;umpectomy - being monitored, undergoing observation.  Past Medical History:  Diagnosis Date   Arthritis    "hands sometimes" (07/13/2017)   Coronary artery disease    a. s/p CABG x3 in 06/2017 with LIMA-LAD, SVG-D1, and SVG-RI.  b. 10/2017: cath showing a widely patent LIMA-LAD with ostial occlusion of the SVG-RI and subtotally occluded atretic SVG-D1. Graft occlusion thought to be 2ry to improvement in pre-CABG stenoses.    Essential hypertension    Family history of adverse reaction to anesthesia    sister had a complication that was stated she had a "foggy" episode after her breast surgery was readmitted 1 day post op after being discharged from the hospital. Sister also has significant lung problems that could have contributed to this    Family history of breast cancer    Family history of colon cancer    Hypothyroid    Meniere disease     Myocardial infarction (HCC) 06/2017   during [facility]   Obesity    Osteopenia 01/2012   T score -1.3 FRAX 7.9%/0.6%   Personal history of chemotherapy 2021   Personal history of radiation therapy 2021   left breast     Allergies  Allergen Reactions   Aloe Vera Itching    Per patient severe itching   Sulfa Antibiotics Shortness Of Breath   Sulfasalazine Shortness Of Breath     Current Outpatient Medications  Medication Sig Dispense Refill   AREXVY 120 MCG/0.5ML injection      aspirin EC 81 MG tablet Take 81 mg by mouth daily.     atorvastatin (LIPITOR) 80 MG tablet TAKE 1 TABLET BY MOUTH ONCE DAILY AT 6PM 90 tablet 3   Cholecalciferol (VITAMIN D PO) Take 1 tablet by mouth daily.      Cyanocobalamin (VITAMIN B-12 PO) Take 1 tablet by mouth daily.     fish oil-omega-3 fatty acids 1000 MG capsule Take 1 g by mouth daily.     furosemide (LASIX) 40 MG tablet Take 40 mg by mouth daily.     levothyroxine (SYNTHROID) 25 MCG tablet Take 25 mcg by mouth daily.     levothyroxine (SYNTHROID, LEVOTHROID) 75 MCG tablet Take 75 mcg by mouth daily.     losartan (COZAAR) 25 MG tablet  TAKE 1/2 TABLET EVERY DAY 45 tablet 3   methocarbamol (ROBAXIN) 500 MG tablet SMARTSIG:1 Tablet(s) By Mouth 1-3 Times Daily     metoprolol tartrate (LOPRESSOR) 25 MG tablet TAKE 1 TABLET TWICE DAILY 180 tablet 3   nitroGLYCERIN (NITROSTAT) 0.4 MG SL tablet Place 0.4 mg under the tongue every 5 (five) minutes x 3 doses as needed for chest pain (if no relief after 3rd dose, proceed to ED or call 911).     omega-3 acid ethyl esters (LOVAZA) 1 g capsule Take by mouth.     SPIKEVAX syringe      vitamin E 100 UNIT capsule Take 1 capsule (100 Units total) by mouth daily. 30 capsule 0   No current facility-administered medications for this visit.     Past Surgical History:  Procedure Laterality Date   BREAST LUMPECTOMY WITH RADIOACTIVE SEED AND SENTINEL LYMPH NODE BIOPSY Left 09/01/2018   Procedure: LEFT  BREAST LUMPECTOMY WITH RADIOACTIVE SEED AND SENTINEL LYMPH NODE BIOPSY;  Surgeon: Griselda Miner, MD;  Location: [facility] OR;  Service: General;  Laterality: Left;   BREAST SURGERY Left 2020   INVASIVE DUCTAL CARCINOMA/DUCTAL CARCINOMA IN SITU/MARGINS UNINVOLVED   CARDIAC CATHETERIZATION  07/13/2017   CATARACT EXTRACTION W/PHACO Right 01/28/2015   Procedure: CATARACT EXTRACTION PHACO AND INTRAOCULAR LENS PLACEMENT :  CDE:  5.70;  Surgeon: Jethro Bolus, MD;  Location: [facility]S;  Service: Ophthalmology;  Laterality: Right;   CATARACT EXTRACTION W/PHACO Left 02/11/2015   Procedure: CATARACT EXTRACTION PHACO AND INTRAOCULAR LENS PLACEMENT (IOC);  Surgeon: Jethro Bolus, MD;  Location: [facility];  Service: Ophthalmology;  Laterality: Left;  CDE: 7.38   COLONOSCOPY N/A 09/26/2012   Procedure: COLONOSCOPY;  Surgeon: Dalia Heading, MD;  Location: [facility];  Service: Gastroenterology;  Laterality: N/A;   CORONARY ARTERY BYPASS GRAFT N/A 07/15/2017   Procedure: CORONARY ARTERY BYPASS GRAFTING (CABG) X 3 USING LEFT INTERNAL MAMMARY ARTERY AND RIGHT SAPHENOUS VEIN- ENDOSCOPICALLY HARVESTED;  Surgeon: Loreli Slot, MD;  Location: [facility] OR;  Service: Open Heart Surgery;  Laterality: N/A;   IR IMAGING GUIDED PORT INSERTION  12/29/2018   IR REMOVAL TUN ACCESS W/ PORT W/O FL MOD SED  04/26/2019   LEFT HEART CATH AND CORONARY ANGIOGRAPHY N/A 07/13/2017   Procedure: LEFT HEART CATH AND CORONARY ANGIOGRAPHY;  Surgeon: Swaz[place], Peter M, MD;  Location: [facility] INVASIVE CV LAB;  Service: Cardiovascular;  Laterality: N/A;   LEFT HEART CATH AND CORS/GRAFTS ANGIOGRAPHY N/A 11/03/2017   Procedure: LEFT HEART CATH AND CORS/GRAFTS ANGIOGRAPHY;  Surgeon: Lennette Bihari, MD;  Location: [facility] INVASIVE CV LAB;  Service: Cardiovascular;  Laterality: N/A;   TEE WITHOUT CARDIOVERSION N/A 07/15/2017   Procedure: TRANSESOPHAGEAL ECHOCARDIOGRAM (TEE);  Surgeon: Loreli Slot, MD;  Location: [facility] OR;  Service: Open Heart Surgery;   Laterality: N/A;   TUBAL LIGATION     TYMPANOPLASTY Left    fluid from ear drum     Allergies  Allergen Reactions   Aloe Vera Itching    Per patient severe itching   Sulfa Antibiotics Shortness Of Breath   Sulfasalazine Shortness Of Breath      Family History  Problem Relation Age of Onset   Diabetes Sister        AODM   Heart disease Sister 29       CABG   Breast cancer Sister    Heart disease Brother 66       In his 16s   Colon cancer Maternal Uncle 48  Diabetes Sister        AODM   Hypertension Daughter    Hypertension Daughter    Heart attack Father        In his 83s   Stroke Mother    Stroke Maternal Grandmother    Diabetes Maternal Grandfather        d. 109   Heart attack Paternal Grandmother    Colon cancer Maternal Uncle 23   Breast cancer Cousin        dx 61s; d. 49s   Breast cancer Niece 36     Social History Ms. Duggan reports that she has never smoked. She quit smokeless tobacco use about 5 years ago.  Her smokeless tobacco use included snuff. Ms. Hanan reports that she does not currently use alcohol after a past usage of about 1.0 standard drink of alcohol per week.   Review of Systems CONSTITUTIONAL: No weight loss, fever, chills, weakness or fatigue.  HEENT: Eyes: No visual loss, blurred vision, double vision or yellow sclerae.No hearing loss, sneezing, congestion, runny nose or sore throat.  SKIN: No rash or itching.  CARDIOVASCULAR: per hpi RESPIRATORY: No shortness of breath, cough or sputum.  GASTROINTESTINAL: No anorexia, nausea, vomiting or diarrhea. No abdominal pain or blood.  GENITOURINARY: No burning on urination, no polyuria NEUROLOGICAL: No headache, dizziness, syncope, paralysis, ataxia, numbness or tingling in the extremities. No change in bowel or bladder control.  MUSCULOSKELETAL: No muscle, back pain, joint pain or stiffness.  LYMPHATICS: No enlarged nodes. No history of splenectomy.  PSYCHIATRIC: No history of depression  or anxiety.  ENDOCRINOLOGIC: No reports of sweating, cold or heat intolerance. No polyuria or polydipsia.  Marland Kitchen   Physical Examination Today's Vitals   03/09/23 0758  BP: 134/70  Pulse: 60  SpO2: 91%  Weight: 205 lb 6.4 oz (93.2 kg)  Height: 5\' 2"  (1.575 m)   Body mass index is 37.57 kg/m.  Gen: resting comfortably, no acute distress HEENT: no scleral icterus, pupils equal round and reactive, no palptable cervical adenopathy,  CV: RRR, no m/rg, no jvd Resp: Clear to auscultation bilaterally GI: abdomen is soft, non-tender, non-distended, normal bowel sounds, no hepatosplenomegaly MSK: extremities are warm, no edema.  Skin: warm, no rash Neuro:  no focal deficits Psych: appropriate affect   Diagnostic Studies  Jan 2019 cath Mid LM lesion is 25% stenosed. Dist LM to Ost LAD lesion is 80% stenosed. Ost Cx to Prox Cx lesion is 30% stenosed. Ost 1st Mrg lesion is 50% stenosed. Prox LAD lesion is 90% stenosed. The left ventricular systolic function is normal. LV end diastolic pressure is normal. The left ventricular ejection fraction is 55-65% by visual estimate.   1. Single vessel obstructive CAD. Patient has complex ostial and proximal to mid LAD disease that involves the origin of the ramus intermediate and first diagonal respectively. 2. Normal LV function 3. Normal LVEDP   Plan: given complexity of lesions with ostial and bifurcation LAD disease I would recommend consideration for CABG.   Jan 2019 Carotid US Final Interpretation: Right Carotid: Velocities in the right ICA are consistent with a 1-39% stenosis.  Left Carotid: Velocities in the left ICA are consistent with a 1-39% stenosis. Vertebrals:  Both vertebral arteries were patent with antegrade flow. Subclavians:   Jan 2019 echo Study Conclusions   - Left ventricle: The cavity size was normal. Wall thickness was   increased in a pattern of mild LVH. Systolic function was normal.   The estimated ejection  fraction  was in the range of 55% to 60%.   Wall motion was normal; there were no regional wall motion   abnormalities. Doppler parameters are consistent with abnormal   left ventricular relaxation (grade 1 diastolic dysfunction). - Mitral valve: Valve area by pressure half-time: 1.26 cm^2.   Impressions:   - Normal LV systolic function; mild LVH; mild diastolic   dysfunction.   10/2017 cath Ost 1st Diag to 1st Diag lesion is 40% stenosed. Ost Cx lesion is 25% stenosed. Ost Ramus lesion is 20% stenosed. Ost LAD lesion is 45% stenosed. Prox LAD to Mid LAD lesion is 80% stenosed. Origin lesion is 100% stenosed. Origin lesion is 99% stenosed. Origin to Dist Graft lesion is 99% stenosed. Dist Graft lesion is 100% stenosed. LV end diastolic pressure is mildly elevated. The left ventricular systolic function is normal.   Hyperdynamic LV function with an EF of 65% without focal segmental wall motion abnormalities.   Native coronary obstructive disease with 40% ostial LAD stenosis, diffuse 40 to 50% proximal diagonal stenosis with 80% LAD stenosis diffusely after the diagonal takeoff and evidence for competitive filling to the mid LAD via the LIMA graft; 25% ostial smooth narrowing in the ramus intermediate vessel; 25% ostial smooth narrowing in the left circumflex vessel; normal RCA.   Widely patent LIMA graft which supplies the mid LAD.   Totally ostial occlusion of the SVG which had supplied the ramus intermediate vessel.   Subtotally occluded ostial vein graft with diffuse 99% narrowing insistent with an atretic graft  which does not fill the diagonal vessel.   POST CATH RECOMMENDATION: Medical therapy.  I suspect the early vein graft occlusion was contributed by improvement in the prior pre-CABG stenoses.       Assessment and Plan  1. CAD with chronic stable angina -isolated episode of chest pain since last visit lasting just a few seconds, no exertional symptoms - continue  current meds at this time.  - EKG shows SR, no acute ischemic changes   2. Hyperlipidemia -lipids are at goal, continue current meds   3. HTN - at goal, continue current meds    F/u 6 months    Antoine Poche, M.D.

## 2023-03-31 ENCOUNTER — Inpatient Hospital Stay: Payer: Medicare HMO | Attending: Adult Health | Admitting: Adult Health

## 2023-03-31 ENCOUNTER — Ambulatory Visit (HOSPITAL_COMMUNITY)
Admission: RE | Admit: 2023-03-31 | Discharge: 2023-03-31 | Disposition: A | Discharge status: Home / Self Care | Payer: Medicare HMO | Source: Ambulatory Visit | Attending: Adult Health | Admitting: Adult Health

## 2023-03-31 ENCOUNTER — Encounter: Payer: Self-pay | Admitting: Adult Health

## 2023-03-31 VITALS — BP 150/55 | HR 59 | Temp 97.7°F | Resp 16 | Wt 206.6 lb

## 2023-03-31 DIAGNOSIS — C50512 Malignant neoplasm of lower-outer quadrant of left female breast: Secondary | ICD-10-CM | POA: Insufficient documentation

## 2023-03-31 DIAGNOSIS — Z9221 Personal history of antineoplastic chemotherapy: Secondary | ICD-10-CM | POA: Diagnosis not present

## 2023-03-31 DIAGNOSIS — Z171 Estrogen receptor negative status [ER-]: Secondary | ICD-10-CM | POA: Insufficient documentation

## 2023-03-31 DIAGNOSIS — I10 Essential (primary) hypertension: Secondary | ICD-10-CM | POA: Diagnosis not present

## 2023-03-31 DIAGNOSIS — Z833 Family history of diabetes mellitus: Secondary | ICD-10-CM | POA: Insufficient documentation

## 2023-03-31 DIAGNOSIS — Z79899 Other long term (current) drug therapy: Secondary | ICD-10-CM | POA: Diagnosis not present

## 2023-03-31 DIAGNOSIS — M47812 Spondylosis without myelopathy or radiculopathy, cervical region: Secondary | ICD-10-CM | POA: Diagnosis not present

## 2023-03-31 DIAGNOSIS — Z803 Family history of malignant neoplasm of breast: Secondary | ICD-10-CM | POA: Insufficient documentation

## 2023-03-31 DIAGNOSIS — M542 Cervicalgia: Secondary | ICD-10-CM | POA: Insufficient documentation

## 2023-03-31 DIAGNOSIS — I252 Old myocardial infarction: Secondary | ICD-10-CM | POA: Diagnosis not present

## 2023-03-31 DIAGNOSIS — M549 Dorsalgia, unspecified: Secondary | ICD-10-CM | POA: Insufficient documentation

## 2023-03-31 DIAGNOSIS — Z8249 Family history of ischemic heart disease and other diseases of the circulatory system: Secondary | ICD-10-CM | POA: Insufficient documentation

## 2023-03-31 DIAGNOSIS — N6489 Other specified disorders of breast: Secondary | ICD-10-CM | POA: Diagnosis not present

## 2023-03-31 DIAGNOSIS — Z800 Family history of malignant neoplasm of digestive organs: Secondary | ICD-10-CM | POA: Diagnosis not present

## 2023-03-31 DIAGNOSIS — M40204 Unspecified kyphosis, thoracic region: Secondary | ICD-10-CM | POA: Diagnosis not present

## 2023-03-31 DIAGNOSIS — Z823 Family history of stroke: Secondary | ICD-10-CM | POA: Insufficient documentation

## 2023-03-31 NOTE — Progress Notes (Signed)
[facility] Follow up:    Gloria Nevins, MD [address] [place] [address]   DIAGNOSIS:  Cancer Staging  Malignant neoplasm of lower-outer quadrant of left breast of female, estrogen receptor negative (HCC) Staging form: Breast, AJCC 8th Edition - Clinical: Stage IB (cT1b, cN0(sn), cM0, G2, ER-, PR-, HER2-) - Unsigned Method of lymph node assessment: Sentinel lymph node biopsy Histologic grading system: 3 grade system Laterality: Left Tumor size (mm): 7 - Pathologic: Stage IB (pT1c, pN0, cM0, G2, ER-, PR-, HER2-) - Signed by Gloria Socks, NP on 09/13/2018 Neoadjuvant therapy: No Histologic grading system: 3 grade system   SUMMARY OF ONCOLOGIC HISTORY: Oncology History  Malignant neoplasm of lower-outer quadrant of left breast of female, estrogen receptor negative (HCC)  08/01/2018 Initial Diagnosis   Screening mammography revealed an abnormality in the left breast. Diagnostic testing revealed a 0.7 cm mass at 7 o'clock, 7 cm from the nipple. No lymphadenopathy seen in the left axilla. Biopsy ([id number]) revealed: IDC, grade II; ER 0%, PR 0%, Ki67 40%, HER2 negative (1+). Clinical T1c N0.   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  The Hereditary Gene Panel offered by [facility] includes sequencing and/or deletion duplication testing of the following 47 genes: APC, ATM, AXIN2, BARD1, BMPR1A, BRCA1, BRCA2, BRIP1, CDH1, CDK4, CDKN2A (p14ARF), CDKN2A (p16INK4a), CHEK2, CTNNA1, DICER1, EPCAM (Deletion/duplication testing only), GREM1 (promoter region deletion/duplication testing only), KIT, MEN1, MLH1, MSH2, MSH3, MSH6, MUTYH, NBN, NF1, NHTL1, PALB2, PDGFRA, PMS2, POLD1, POLE, PTEN, RAD50, RAD51C, RAD51D, SDHB, SDHC, SDHD, SMAD4, SMARCA4. STK11, TP53, TSC1, TSC2, and VHL.  The following genes were evaluated for sequence changes only: SDHA and HOXB13 c.251G>A variant only. The report date is  08-29-2018.   08/24/2018 Breast MRI   Bilateral breast MRI with and without contrast.  Right breast: No suspicious mass or abnormal enhancement. Benign intramammary lymph nodes in the outer right breast.   Left breast: In the posterior third of the lower inner quadrant of the left breast is an irregular enhancing mass spanning approximately 1.3 x 0.8 x 0.5 cm with internal biopsy clip artifact and washout kinetics, corresponding to the   biopsy-proven malignancy. The mass/enhancement do not involve the pectoralis muscle. Benign intramammary lymph node in the middle third of the outer left breast is mammographically stable. There is more glandular tissue in the left breast on the right, corresponding with chronic appearance of mammograms dating back to at least 2010.  Lymph nodes: No abnormal appearing lymph nodes.   09/01/2018 Surgery   Left lumpectomy Gloria [facility]) [phone]): IDC, nottingham grade 2 of 3, 1.1 cm. Margins uninvolved. 0/3 lymph nodes positive for carcinoma. Pathologic stage pT1c pN0.   09/13/2018 Cancer Staging   Staging form: Breast, AJCC 8th Edition - Pathologic: Stage IB (pT1c, pN0, cM0, G2, ER-, PR-, HER2-) - Signed by Gloria Socks, NP on 09/13/2018   10/24/2018 - 04/05/2019 Chemotherapy   Patient is on Treatment Plan : BREAST Adjuvant CMF IV q21d     12/05/2018 - 01/19/2019 Radiation Therapy   Gloria [facility]ccoy) The patient initially received a dose of 50.4 Gy in 28 fractions to the breast using whole-breast tangent fields. This was delivered using a 3-D conformal technique. The patient then received a boost to the seroma. This delivered an additional 10 Gy in 5 fractions using a 3 field photon boost technique. The total dose was 60.4 Gy.     CURRENT THERAPY: Observation  INTERVAL HISTORY: Gloria Mccoy  77 y.o. female returns for follow-up of her history of breast cancer.  Her most recent mammogram occurred on December 13, 2022 demonstrating no mammographic evidence of malignancy  and breast density category B.  Since she had triple negative breast cancer she continues on observation alone.  Gloria Mccoy tells me that she has been feeling well.  She has unfortunately been experiencing neck pain for the past 3 months that is radiating down her left arm.  She denies any focal weakness in her left arm.  Otherwise she is feeling well and has no significant issues.   Patient Active Problem List   Diagnosis Date Noted   Port-A-Cath in place 01/16/2019   Genetic testing 08/29/2018   Family history of breast cancer    Family history of colon cancer    Malignant neoplasm of lower-outer quadrant of left breast of female, estrogen receptor negative (HCC) 08/16/2018   NSTEMI (non-ST elevated myocardial infarction) (HCC) 11/02/2017   S/P CABG x 3 07/15/2017   Coronary artery disease 07/15/2017   Unstable angina (HCC) 07/13/2017   Essential hypertension 07/13/2017   Obesity 07/13/2017   Hyperglycemia 07/13/2017   Hypothyroid 01/06/2012   Meniere disease    Arthritis     is allergic to aloe vera, sulfa antibiotics, and sulfasalazine.  MEDICAL HISTORY: Past Medical History:  Diagnosis Date   Arthritis    "hands sometimes" (07/13/2017)   Coronary artery disease    a. s/p CABG x3 in 06/2017 with LIMA-LAD, SVG-D1, and SVG-RI.  b. 10/2017: cath showing a widely patent LIMA-LAD with ostial occlusion of the SVG-RI and subtotally occluded atretic SVG-D1. Graft occlusion thought to be 2ry to improvement in pre-CABG stenoses.    Essential hypertension    Family history of adverse reaction to anesthesia    sister had a complication that was stated she had a "foggy" episode after her breast surgery was readmitted 1 day post op after being discharged from the hospital. Sister also has significant lung problems that could have contributed to this    Family history of breast cancer    Family history of colon cancer    Hypothyroid    Meniere disease    Myocardial infarction (HCC) 06/2017    during [facility] rehab   Obesity    Osteopenia 01/2012   T score -1.3 FRAX 7.9%/0.6%   Personal history of chemotherapy 2021   Personal history of radiation therapy 2021   left breast    SURGICAL HISTORY: Past Surgical History:  Procedure Laterality Date   BREAST LUMPECTOMY WITH RADIOACTIVE SEED AND SENTINEL LYMPH NODE BIOPSY Left 09/01/2018   Procedure: LEFT BREAST LUMPECTOMY WITH RADIOACTIVE SEED AND SENTINEL LYMPH NODE BIOPSY;  Surgeon: Griselda Miner, MD;  Location: [facility] OR;  Service: General;  Laterality: Left;   BREAST SURGERY Left 2020   INVASIVE DUCTAL CARCINOMA/DUCTAL CARCINOMA IN SITU/MARGINS UNINVOLVED   CARDIAC CATHETERIZATION  07/13/2017   CATARACT EXTRACTION W/PHACO Right 01/28/2015   Procedure: CATARACT EXTRACTION PHACO AND INTRAOCULAR LENS PLACEMENT :  CDE:  5.70;  Surgeon: Jethro Bolus, MD;  Location: [facility] ORS;  Service: Ophthalmology;  Laterality: Right;   CATARACT EXTRACTION W/PHACO Left 02/11/2015   Procedure: CATARACT EXTRACTION PHACO AND INTRAOCULAR LENS PLACEMENT (IOC);  Surgeon: Jethro Bolus, MD;  Location: [facility] OR[facility];  Service: Ophthalmology;  Laterality: Left;  CDE: 7.38   COLONOSCOPY N/A 09/26/2012   Procedure: COLONOSCOPY;  Surgeon: Dalia Heading, MD;  Location: [facility];  Service: Gastroenterology;  Laterality: N/A;   CORONARY ARTERY BYPASS GRAFT N/A  07/15/2017   Procedure: CORONARY ARTERY BYPASS GRAFTING (CABG) X 3 USING LEFT INTERNAL MAMMARY ARTERY AND RIGHT SAPHENOUS VEIN- ENDOSCOPICALLY HARVESTED;  Surgeon: Loreli Slot, MD;  Location[facility] OR;  Service: Open Heart Surgery;  Laterality: N/A;   IR IMAGING GUIDED PORT INSERTION  12/29/2018   IR REMOVAL TUN ACCESS W/ PORT W/O FL MOD SED  04/26/2019   LEFT HEART CATH AND CORONARY ANGIOGRAPHY N/A 07/13/2017   Procedure: LEFT HEART CATH AND CORONARY ANGIOGRAPHY;  Surgeon: [place], Peter M, MD;  Location: [facility];  Service: Cardiovascular;  Laterality: N/A;   LEFT HEART CATH AND CORS/GRAFTS  ANGIOGRAPHY N/A 11/03/2017   Procedure: LEFT HEART CATH AND CORS/GRAFTS ANGIOGRAPHY;  Surgeon: Lennette Bihari, MD;  Location: [facility] INVASIVE CV LAB;  Service: Cardiovascular;  Laterality: N/A;   TEE WITHOUT CARDIOVERSION N/A 07/15/2017   Procedure: TRANSESOPHAGEAL ECHOCARDIOGRAM (TEE);  Surgeon: Loreli Slot, MD;  Location: [facility] OR;  Service: Open Heart Surgery;  Laterality: N/A;   TUBAL LIGATION     TYMPANOPLASTY Left    fluid from ear drum    SOCIAL HISTORY: Social History   Socioeconomic History   Marital status: Married    Spouse name: Not on file   Number of children: Not on file   Years of education: Not on file   Highest education level: Not on file  Occupational History   Occupation: Retired  Tobacco Use   Smoking status: Never   Smokeless tobacco: Former    Types: Snuff    Quit date: 07/31/2017  Vaping Use   Vaping status: Never Used  Substance and Sexual Activity   Alcohol use: Not Currently    Alcohol/week: 1.0 standard drink of alcohol    Types: 1 Standard drinks or equivalent per week    Comment: occasionally   Drug use: No   Sexual activity: Yes    Birth control/protection: Surgical  Other Topics Concern   Not on file  Social History Narrative      Left handed    Lives at home with husband    Caffeine- some    Social Determinants of Health   Financial Resource Strain: Not on file  Food Insecurity: No Food Insecurity (04/05/2022)   Hunger Vital Sign    Worried About Running Out of Food in the Last Year: Never true    Ran Out of Food in the Last Year: Never true  Transportation Needs: No Transportation Needs (04/05/2022)   PRAPARE - Administrator, Civil Service (Medical): No    Lack of Transportation (Non-Medical): No  Physical Activity: Not on file  Stress: Not on file  Social Connections: Not on file  Intimate Partner Violence: Not on file    FAMILY HISTORY: Family History  Problem Relation Age of Onset   Diabetes Sister         AODM   Heart disease Sister 47       CABG   Breast cancer Sister    Heart disease Brother 36       In his 1s   Colon cancer Maternal Uncle 84   Diabetes Sister        AODM   Hypertension Daughter    Hypertension Daughter    Heart attack Father        In his 71s   Stroke Mother    Stroke Maternal Grandmother    Diabetes Maternal Grandfather        d. 109   Heart attack Paternal  Grandmother    Colon cancer Maternal Uncle 60   Breast cancer Cousin        dx 71s; d. 64s   Breast cancer Niece 2    Review of Systems  Constitutional:  Negative for appetite change, chills, fatigue, fever and unexpected weight change.  HENT:   Negative for hearing loss, lump/mass and trouble swallowing.   Eyes:  Negative for eye problems and icterus.  Respiratory:  Negative for chest tightness, cough and shortness of breath.   Cardiovascular:  Negative for chest pain, leg swelling and palpitations.  Gastrointestinal:  Negative for abdominal distention, abdominal pain, constipation, diarrhea, nausea and vomiting.  Endocrine: Negative for hot flashes.  Genitourinary:  Negative for difficulty urinating.   Musculoskeletal:  Positive for neck pain. Negative for arthralgias.  Skin:  Negative for itching and rash.  Neurological:  Negative for dizziness, extremity weakness, headaches and numbness.  Hematological:  Negative for adenopathy. Does not bruise/bleed easily.  Psychiatric/Behavioral:  Negative for depression. The patient is not nervous/anxious.       PHYSICAL EXAMINATION   Onc Performance Status - 03/31/23 0836       ECOG Perf Status   ECOG Perf Status Restricted in physically strenuous activity but ambulatory and able to carry out work of a light or sedentary nature, e.g., light house work, office work      KPS SCALE   KPS % SCORE Able to carry on normal activity, minor s/s of disease             Vitals:   03/31/23 0832  BP: (!) 150/55  Pulse: (!) 59  Resp: 16  Temp: 97.7  F (36.5 C)  SpO2: 95%    Physical Exam Constitutional:      General: She is not in acute distress.    Appearance: Normal appearance. She is not toxic-appearing.  HENT:     Head: Normocephalic and atraumatic.     Mouth/Throat:     Mouth: Mucous membranes are moist.     Pharynx: Oropharynx is clear. No oropharyngeal exudate or posterior oropharyngeal erythema.  Eyes:     General: No scleral icterus. Cardiovascular:     Rate and Rhythm: Normal rate and regular rhythm.     Pulses: Normal pulses.     Heart sounds: Normal heart sounds.  Pulmonary:     Effort: Pulmonary effort is normal.     Breath sounds: Normal breath sounds.  Chest:     Comments: Left breast status postlumpectomy and radiation no sign of local recurrence right breast is benign. Abdominal:     General: Abdomen is flat. Bowel sounds are normal. There is no distension.     Palpations: Abdomen is soft.     Tenderness: There is no abdominal tenderness.  Musculoskeletal:        General: No swelling.     Cervical back: Neck supple.     Comments: Strength is 5 out of 5 in the bilateral upper extremities.  Lymphadenopathy:     Cervical: No cervical adenopathy.     Upper Body:     Right upper body: No axillary adenopathy.     Left upper body: No axillary adenopathy.  Skin:    General: Skin is warm and dry.     Findings: No rash.  Neurological:     General: No focal deficit present.     Mental Status: She is alert.  Psychiatric:        Mood and Affect: Mood normal.  Behavior: Behavior normal.       ASSESSMENT and THERAPY PLAN:   Malignant neoplasm of lower-outer quadrant of left breast of female, estrogen receptor negative (HCC) stage Ib triple negative breast cancer diagnosed in February 2020 status post left lumpectomy, adjuvant chemotherapy, and adjuvant radiation.   Breast Cancer Surveillance: Breast Exam: 03/2023 benign Mammogram: 11/2022: Benign Density Cat B  Neck pain: I obtained x-ray  images today of her cervical and thoracic spine Health maintenance: I recommended plenty of fruits and vegetables along with exercise as she is able.  Return to clinic in 6 months for continued follow-up.    All questions were answered. The patient knows to call the clinic with any problems, questions or concerns. We can certainly see the patient much sooner if necessary.  Total encounter time:30 minutes*in face-to-face visit time, chart review, lab review, care coordination, order entry, and documentation of the encounter time.    Lillard Anes, NP 03/31/23 9:00 AM Medical Oncology and Hematology [facility] [address] [place], [place] [address] Tel. [phone]    Fax. [phone]  *Total Encounter Time as defined by the Centers for Medicare and Medicaid Services includes, in addition to the face-to-face time of a patient visit (documented in the note above) non-face-to-face time: obtaining and reviewing outside history, ordering and reviewing medications, tests or procedures, care coordination (communications with other health care professionals or caregivers) and documentation in the medical record.

## 2023-03-31 NOTE — Assessment & Plan Note (Signed)
stage Ib triple negative breast cancer diagnosed in February 2020 status post left lumpectomy, adjuvant chemotherapy, and adjuvant radiation.   Breast Cancer Surveillance: Breast Exam: 03/2023 benign Mammogram: 11/2022: Benign Density Cat B  Neck pain: I obtained x-ray images today of her cervical and thoracic spine Health maintenance: I recommended plenty of fruits and vegetables along with exercise as she is able.  Return to clinic in 6 months for continued follow-up.

## 2023-04-01 ENCOUNTER — Telehealth: Payer: Self-pay | Admitting: Adult Health

## 2023-04-01 NOTE — Telephone Encounter (Signed)
-----   Message from Noreene Filbert sent at 03/31/2023  1:48 PM EDT ----- Please tell patient the good news that her x-rays do not show any cancer recurrence in her neck or upper back.  I recommend that she continue to follow-up with primary care about this. ----- Message ----- From: Interface, Rad Results In Sent: 03/31/2023  11:28 AM EDT To: [name], NP

## 2023-04-01 NOTE — Telephone Encounter (Signed)
Per Lillard Anes, DNP, called the pt with message below. Pt verbalized understanding

## 2023-04-02 ENCOUNTER — Other Ambulatory Visit: Payer: Self-pay | Admitting: Cardiology

## 2023-04-04 NOTE — Telephone Encounter (Signed)
error 

## 2023-07-20 ENCOUNTER — Encounter (HOSPITAL_COMMUNITY): Payer: Self-pay

## 2023-07-20 ENCOUNTER — Emergency Department (HOSPITAL_COMMUNITY): Payer: Medicare HMO

## 2023-07-20 ENCOUNTER — Emergency Department (HOSPITAL_COMMUNITY)
Admission: EM | Admit: 2023-07-20 | Discharge: 2023-07-20 | Disposition: A | Discharge status: Home / Self Care | Payer: Medicare HMO | Attending: Emergency Medicine | Admitting: Emergency Medicine

## 2023-07-20 DIAGNOSIS — Z853 Personal history of malignant neoplasm of breast: Secondary | ICD-10-CM | POA: Insufficient documentation

## 2023-07-20 DIAGNOSIS — Z20822 Contact with and (suspected) exposure to covid-19: Secondary | ICD-10-CM | POA: Diagnosis not present

## 2023-07-20 DIAGNOSIS — Z951 Presence of aortocoronary bypass graft: Secondary | ICD-10-CM | POA: Diagnosis not present

## 2023-07-20 DIAGNOSIS — R059 Cough, unspecified: Secondary | ICD-10-CM | POA: Diagnosis present

## 2023-07-20 DIAGNOSIS — J101 Influenza due to other identified influenza virus with other respiratory manifestations: Secondary | ICD-10-CM | POA: Diagnosis not present

## 2023-07-20 DIAGNOSIS — R519 Headache, unspecified: Secondary | ICD-10-CM | POA: Diagnosis not present

## 2023-07-20 DIAGNOSIS — R102 Pelvic and perineal pain: Secondary | ICD-10-CM | POA: Diagnosis not present

## 2023-07-20 DIAGNOSIS — I517 Cardiomegaly: Secondary | ICD-10-CM | POA: Diagnosis not present

## 2023-07-20 DIAGNOSIS — Z79899 Other long term (current) drug therapy: Secondary | ICD-10-CM | POA: Diagnosis not present

## 2023-07-20 DIAGNOSIS — R0602 Shortness of breath: Secondary | ICD-10-CM | POA: Diagnosis not present

## 2023-07-20 DIAGNOSIS — I10 Essential (primary) hypertension: Secondary | ICD-10-CM | POA: Insufficient documentation

## 2023-07-20 DIAGNOSIS — Z7982 Long term (current) use of aspirin: Secondary | ICD-10-CM | POA: Diagnosis not present

## 2023-07-20 DIAGNOSIS — I2510 Atherosclerotic heart disease of native coronary artery without angina pectoris: Secondary | ICD-10-CM | POA: Diagnosis not present

## 2023-07-20 DIAGNOSIS — I6782 Cerebral ischemia: Secondary | ICD-10-CM | POA: Diagnosis not present

## 2023-07-20 DIAGNOSIS — R29818 Other symptoms and signs involving the nervous system: Secondary | ICD-10-CM | POA: Diagnosis not present

## 2023-07-20 LAB — HEPATIC FUNCTION PANEL
ALT: 24 U/L (ref 0–44)
AST: 35 U/L (ref 15–41)
Albumin: 3.9 g/dL (ref 3.5–5.0)
Alkaline Phosphatase: 56 U/L (ref 38–126)
Bilirubin, Direct: 0.2 mg/dL (ref 0.0–0.2)
Indirect Bilirubin: 1.4 mg/dL — ABNORMAL HIGH (ref 0.3–0.9)
Total Bilirubin: 1.6 mg/dL — ABNORMAL HIGH (ref 0.0–1.2)
Total Protein: 8.1 g/dL (ref 6.5–8.1)

## 2023-07-20 LAB — URINALYSIS, ROUTINE W REFLEX MICROSCOPIC
Bacteria, UA: NONE SEEN
Bilirubin Urine: NEGATIVE
Glucose, UA: NEGATIVE mg/dL
Hgb urine dipstick: NEGATIVE
Ketones, ur: 5 mg/dL — AB
Leukocytes,Ua: NEGATIVE
Nitrite: NEGATIVE
Protein, ur: 30 mg/dL — AB
Specific Gravity, Urine: 1.02 (ref 1.005–1.030)
pH: 5 (ref 5.0–8.0)

## 2023-07-20 LAB — CBC WITH DIFFERENTIAL/PLATELET
Abs Immature Granulocytes: 0.02 K/uL (ref 0.00–0.07)
Basophils Absolute: 0.1 K/uL (ref 0.0–0.1)
Basophils Relative: 1 %
Eosinophils Absolute: 0.2 K/uL (ref 0.0–0.5)
Eosinophils Relative: 2 %
HCT: 42.2 % (ref 36.0–46.0)
Hemoglobin: 15.3 g/dL — ABNORMAL HIGH (ref 12.0–15.0)
Immature Granulocytes: 0 %
Lymphocytes Relative: 27 %
Lymphs Abs: 2.3 K/uL (ref 0.7–4.0)
MCH: 30.6 pg (ref 26.0–34.0)
MCHC: 36.3 g/dL — ABNORMAL HIGH (ref 30.0–36.0)
MCV: 84.4 fL (ref 80.0–100.0)
Monocytes Absolute: 1.7 K/uL — ABNORMAL HIGH (ref 0.1–1.0)
Monocytes Relative: 21 %
Neutro Abs: 4.1 K/uL (ref 1.7–7.7)
Neutrophils Relative %: 49 %
Platelets: 169 K/uL (ref 150–400)
RBC: 5 MIL/uL (ref 3.87–5.11)
RDW: 14.7 % (ref 11.5–15.5)
WBC: 8.3 K/uL (ref 4.0–10.5)
nRBC: 0 % (ref 0.0–0.2)

## 2023-07-20 LAB — BASIC METABOLIC PANEL
Anion gap: 12 (ref 5–15)
BUN: 15 mg/dL (ref 8–23)
CO2: 29 mmol/L (ref 22–32)
Calcium: 9 mg/dL (ref 8.9–10.3)
Chloride: 97 mmol/L — ABNORMAL LOW (ref 98–111)
Creatinine, Ser: 1.07 mg/dL — ABNORMAL HIGH (ref 0.44–1.00)
GFR, Estimated: 53 mL/min — ABNORMAL LOW (ref >60)
Glucose, Bld: 103 mg/dL — ABNORMAL HIGH (ref 70–99)
Potassium: 3.3 mmol/L — ABNORMAL LOW (ref 3.5–5.1)
Sodium: 138 mmol/L (ref 135–145)

## 2023-07-20 LAB — RESP PANEL BY RT-PCR (RSV, FLU A&B, COVID)  RVPGX2
Influenza A by PCR: POSITIVE — AB
Influenza B by PCR: NEGATIVE
Resp Syncytial Virus by PCR: NEGATIVE
SARS Coronavirus 2 by RT PCR: NEGATIVE

## 2023-07-20 NOTE — Discharge Instructions (Addendum)
You were seen today for influenza A and tremors.  I doubt we the tremors are due to any emergent process at this time.  Continue taking medications as prescribed.  Symptoms may continue for the next 3 to 4 days.  Return to the ER if you are experiencing any worsening shortness of breath, chest pain, confusion, tremors, nausea, vomiting, seizures.   In the meantime you can continue to take Tylenol for pain medication and fever control if present.  Take Tylenol (acetominophen)  650mg  every 4-6 hours, as needed for pain or fever. Do not take more than 4,000 mg in a 24-hour period. As this may cause liver damage. While this is rare, if you begin to develop yellowing of the skin or eyes, stop taking and return to ER immediately.  Recommend that you follow-up with your PCP if tremors continue to persist.

## 2023-07-20 NOTE — ED Triage Notes (Signed)
Pt arrived via POV c/o new onset tremors since yesterday, sinus congestion, dyspnea at rest/exertion and reports multiple falls recently. Pt denies LOC denies hitting her head. Pt endorses pain in bilateral legs and her buttocks.

## 2023-07-20 NOTE — ED Provider Notes (Signed)
[place] EMERGENCY DEPARTMENT AT [facility] Provider Note   CSN: [id number] Arrival date & time: 07/20/23  2355     History  Chief Complaint  Patient presents with   Gloria Mccoy is a 78 y.o. female.  The history is provided by the patient.  Fall Associated symptoms include shortness of breath.   Patient is a 79 year old female presents the ED today complaining of a 1 day history of bilateral upper extremity and lower extremity tremors leading to falls, states that she has also been experiencing cough, shortness of breath, congestion. Denies any headache, vision changes, chest pain, worsening lower extremity swelling, pain, dysuria.  That she has not eaten yet today.  Denies alcohol use.  Patient has been able to ambulate.  She also notes that she has sacral pain after falling onto her buttocks.  Denies LOC, denies head trauma. Denies AV hallucinations, n/v.     Home Medications Prior to Admission medications   Medication Sig Start Date End Date Taking? Authorizing Provider  AREXVY 120 MCG/0.5ML injection  05/24/22   [provider]  aspirin EC 81 MG tablet Take 81 mg by mouth daily.    [provider]  atorvastatin (LIPITOR) 80 MG tablet TAKE 1 TABLET BY MOUTH ONCE DAILY AT [facility] 12/09/22   Antoine Poche, MD  Cholecalciferol (VITAMIN D PO) Take 1 tablet by mouth daily.     [provider]  Cyanocobalamin (VITAMIN B-12 PO) Take 1 tablet by mouth daily.    [provider]  fish oil-omega-3 fatty acids 1000 MG capsule Take 1 g by mouth daily.    [provider]  furosemide (LASIX) 40 MG tablet Take 40 mg by mouth daily.    [provider]  levothyroxine (SYNTHROID) 25 MCG tablet Take 25 mcg by mouth daily. 07/15/21   [provider]  levothyroxine (SYNTHROID, LEVOTHROID) 75 MCG tablet Take 75 mcg by mouth daily.    [provider]  losartan (COZAAR) 25 MG tablet TAKE 1/2 TABLET EVERY DAY  04/04/23   Antoine Poche, MD  methocarbamol (ROBAXIN) 500 MG tablet SMARTSIG:1 Tablet(s) By Mouth 1-3 Times Daily 02/17/23   [provider]  metoprolol tartrate (LOPRESSOR) 25 MG tablet TAKE 1 TABLET TWICE DAILY 12/24/22   Antoine Poche, MD  nitroGLYCERIN (NITROSTAT) 0.4 MG SL tablet Place 0.4 mg under the tongue every 5 (five) minutes x 3 doses as needed for chest pain (if no relief after 3rd dose, proceed to ED or call 911).    [provider]  omega-3 acid ethyl esters (LOVAZA) 1 g capsule Take by mouth. 09/07/16   [provider]  [facility] syringe  04/07/22   [provider]  vitamin E 100 UNIT capsule Take 1 capsule (100 Units total) by mouth daily. 03/15/19   [name], NP      Allergies    Aloe vera, Sulfa antibiotics, and Sulfasalazine    Review of Systems   Review of Systems  Respiratory:  Positive for shortness of breath.   Neurological:  Positive for tremors.  All other systems reviewed and are negative.   Physical Exam Updated Vital Signs BP 105/82   Pulse 78   Temp 98 F (36.7 C) (Oral)   Resp 18   Ht 5\' 2"  (1.575 m)   Wt 94 kg   SpO2 97%   BMI 37.90 kg/m  Physical Exam Vitals and nursing note reviewed.  Constitutional:  Appearance: Normal appearance.  HENT:     Head: Normocephalic and atraumatic.     Mouth/Throat:     Mouth: Mucous membranes are moist.     Pharynx: Oropharynx is clear. No oropharyngeal exudate or posterior oropharyngeal erythema.  Eyes:     General: No scleral icterus.       Right eye: No discharge.        Left eye: No discharge.     Extraocular Movements: Extraocular movements intact.     Conjunctiva/sclera: Conjunctivae normal.  Cardiovascular:     Rate and Rhythm: Normal rate and regular rhythm.     Pulses: Normal pulses.     Heart sounds: Normal heart sounds. No murmur heard.    No friction rub. No gallop.  Pulmonary:     Effort: Pulmonary effort is normal. No  respiratory distress.     Breath sounds: Normal breath sounds.  Abdominal:     General: Abdomen is flat.     Palpations: Abdomen is soft.     Tenderness: There is no abdominal tenderness. There is no right CVA tenderness or left CVA tenderness.  Musculoskeletal:        General: No swelling or tenderness.     Cervical back: No rigidity.     Right lower leg: No edema.     Left lower leg: No edema.  Skin:    General: Skin is warm and dry.     Coloration: Skin is not jaundiced.  Neurological:     General: No focal deficit present.     Mental Status: She is alert and oriented to person, place, and time. Mental status is at baseline.     Sensory: No sensory deficit.     Motor: No weakness.     Coordination: Coordination normal.  Psychiatric:        Mood and Affect: Mood normal.     ED Results / Procedures / Treatments   Labs (all labs ordered are listed, but only abnormal results are displayed) Labs Reviewed  RESP PANEL BY RT-PCR (RSV, FLU A&B, COVID)  RVPGX2 - Abnormal; Notable for the following components:      Result Value   Influenza A by PCR POSITIVE (*)    All other components within normal limits  URINALYSIS, ROUTINE W REFLEX MICROSCOPIC - Abnormal; Notable for the following components:   Ketones, ur 5 (*)    Protein, ur 30 (*)    All other components within normal limits  CBC WITH DIFFERENTIAL/PLATELET - Abnormal; Notable for the following components:   Hemoglobin 15.3 (*)    MCHC 36.3 (*)    Monocytes Absolute 1.7 (*)    All other components within normal limits  BASIC METABOLIC PANEL - Abnormal; Notable for the following components:   Potassium 3.3 (*)    Chloride 97 (*)    Glucose, Bld 103 (*)    Creatinine, Ser 1.07 (*)    GFR, Estimated 53 (*)    All other components within normal limits  HEPATIC FUNCTION PANEL - Abnormal; Notable for the following components:   Total Bilirubin 1.6 (*)    Indirect Bilirubin 1.4 (*)    All other components within normal  limits    EKG None  Radiology MR BRAIN WO CONTRAST Result Date: 07/20/2023 CLINICAL DATA:  Headache, neuro deficit. EXAM: MRI HEAD WITHOUT CONTRAST TECHNIQUE: Multiplanar, multiecho pulse sequences of the brain and surrounding structures were obtained without intravenous contrast. COMPARISON:  Head CT January 28, 2020. FINDINGS: Brain: No acute infarction,  hemorrhage, hydrocephalus, extra-axial collection or mass lesion. Scattered and confluent T2 hyperintensity of the white matter of the cerebral hemispheres, nonspecific. Vascular: Normal flow voids. Skull and upper cervical spine: Normal marrow signal. Sinuses/Orbits: Negative. Other: None. IMPRESSION: 1. No acute intracranial abnormality. 2. Mild chronic microvascular ischemic changes of the white matter. Electronically Signed   By: Baldemar Lenis M.D.   On: 07/20/2023 17:22   DG Pelvis 1-2 Views Result Date: 07/20/2023 CLINICAL DATA:  Pelvic pain after fall. EXAM: PELVIS - 1-2 VIEW COMPARISON:  None Available. FINDINGS: There is no evidence of pelvic fracture or diastasis. No pelvic bone lesions are seen. IMPRESSION: Negative. Electronically Signed   By: Lupita Raider M.D.   On: 07/20/2023 15:20   DG Chest 2 View Result Date: 07/20/2023 CLINICAL DATA:  Shortness of breath. EXAM: CHEST - 2 VIEW COMPARISON:  Nov 02, 2017. FINDINGS: Stable cardiomegaly. Status post coronary artery bypass graft. Both lungs are clear. The visualized skeletal structures are unremarkable. IMPRESSION: No active cardiopulmonary disease. Electronically Signed   By: Lupita Raider M.D.   On: 07/20/2023 15:19    Procedures Procedures    Medications Ordered in ED Medications - No data to display  ED Course/ Medical Decision Making/ A&P                                 Medical Decision Making Amount and/or Complexity of Data Reviewed Labs: ordered. Radiology: ordered.   This patient is a 78 year old female who presents to the ED for concern of  fall with new onset tremors starting yesterday, sinus infection, dyspnea at rest and exertion.  Also endorses bilateral leg pain and buttock pain.   Differential diagnoses prior to evaluation: The emergent differential diagnosis includes, but is not limited to, cerebral dysfunction, hepatic encephalopathy, essential tremor, hypoglycemia, EtOH withdrawal, medication intoxication. This is not an exhaustive differential.   Past Medical History / Co-morbidities / Social History: Mnire's disease, arthritis, unstable angina, HTN, s/p CABG x 3, NSTEMI, CAD, breast cancer status postlumpectomy and adjuvant chemotherapy and adjunct of radiation  Additional history: Chart reviewed. Pertinent results include: None 03/08/2023 for radiculopathy in the neck/head radiating down to upper back, left shoulder, left arm.  No STEMI were noted.  Blocks and was tried previously, steroid shot was given.  Continued with muscle relaxers.  Also seen on 03/31/2023 for malignant neoplasm follow-up where x-rays of cervical and thoracic spine were done due to increased cervical and thoracic pain.  Both were unremarkable  Lab Tests/Imaging studies: I personally interpreted labs/imaging and the pertinent results include:   UA was noted for mild ketonuria with some protein present.  Respiratory panel was positive for influenza A  BMP was notable for mild hypokalemia with a moderately  increased creatinine from previous at 1.07 CBC was unremarkable Hepatic function panel was notable for mildly elevated bilirubin and indirect bilirubin  Chest x-ray was negative Pelvic x-ray negative MRI brain showed mild chronic microvascular ischemic changes of white matter with no other acute abnormality. I agree with the radiologist interpretation.    Medications: No medications are needed for this visit.  I have reviewed the patients home medicines and have made adjustments as needed.  Critical Interventions:  ED Course:   Patient is a 78 year old female presents the ED today complaining of a 1 day history of bilateral upper extremity and lower extremity tremors leading to falls, states that she has also  been experiencing cough, shortness of breath, congestion. Denies any headache, vision changes, chest pain, worsening lower extremity swelling, pain, dysuria.  That she has not eaten yet today.  Denies alcohol use.  Patient has been able to ambulate.  She also notes that she has sacral pain after falling onto her buttocks.  Denies LOC, denies head trauma. Denies. AV hallucinations, n/v.   Physical exam patient was noted to have bilateral upper extremity tremors without any notable lower extremity tremors present.  Bilateral rhonchi are heard on examination.  No tongue fasciculations noted.  Patient has normal sensitivity bilaterally and no weakness noted bilaterally to both upper and lower extremities.  Normal coordination.  Due to tremors of unknown cause, MRI was performed which only showed mild chronic changes with no other acute abnormalities noted.  Low suspicion for any emergent process at this point and believe this patient to be followed up outpatient.  Vital signs remained stable at the course of her time here.  Respiratory panel was positive for influenza A which are likely the cause of her symptoms today.  Low suspicion for any other emergent pathology at this time including stroke, PE, pneumonia, ACS.  Patient has been able to ambulate without difficulty.  Recommend that she continues to monitor symptoms and isolate until symptoms abated.  Provided strict return to ER precautions.  Patient expressed agreement and understanding of plan.    Disposition: After consideration of the diagnostic results and the patients response to treatment, I feel that patient benefit from discharge and treatment as above.   emergency department workup does not suggest an emergent condition requiring admission or immediate  intervention beyond what has been performed at this time. The plan is: Symptomatic treatment, return for any new or worsening symptoms, follow-up with PCP. The patient is safe for discharge and has been instructed to return immediately for worsening symptoms, change in symptoms or any other concerns.  Final Clinical Impression(s) / ED Diagnoses Final diagnoses:  Influenza A    Rx / DC Orders ED Discharge Orders     None         Lavonia Drafts 07/20/23 1821    Bethann Berkshire, MD 07/21/23 1025

## 2023-08-23 ENCOUNTER — Encounter (INDEPENDENT_AMBULATORY_CARE_PROVIDER_SITE_OTHER): Payer: Self-pay | Admitting: *Deleted

## 2023-09-12 ENCOUNTER — Encounter: Payer: Self-pay | Admitting: *Deleted

## 2023-09-15 ENCOUNTER — Encounter: Payer: Self-pay | Admitting: Cardiology

## 2023-09-15 ENCOUNTER — Ambulatory Visit: Payer: Medicare HMO | Attending: Cardiology | Admitting: Cardiology

## 2023-09-15 VITALS — BP 142/78 | HR 67 | Ht 62.0 in | Wt 200.2 lb

## 2023-09-15 DIAGNOSIS — E782 Mixed hyperlipidemia: Secondary | ICD-10-CM

## 2023-09-15 DIAGNOSIS — I25118 Atherosclerotic heart disease of native coronary artery with other forms of angina pectoris: Secondary | ICD-10-CM

## 2023-09-15 DIAGNOSIS — Z951 Presence of aortocoronary bypass graft: Secondary | ICD-10-CM

## 2023-09-15 DIAGNOSIS — I10 Essential (primary) hypertension: Secondary | ICD-10-CM

## 2023-09-15 MED ORDER — LOSARTAN POTASSIUM 25 MG PO TABS: 25.0000 mg | ORAL_TABLET | Freq: Every day | ORAL | 1 refills | Status: DC

## 2023-09-15 NOTE — Patient Instructions (Signed)
 Medication Instructions:  Your physician has recommended you make the following change in your medication:  Please increase Losartan to 25 Mg daily   Labwork: In 2 weeks at [facility]   Testing/Procedures: None   Follow-Up: Your physician recommends that you schedule a follow-up appointment in: 6 months   Any Other Special Instructions Will Be Listed Below (If Applicable).  If you need a refill on your cardiac medications before your next appointment, please call your pharmacy.

## 2023-09-15 NOTE — Progress Notes (Signed)
 Clinical Summary Ms. Learn is a 78 y.o.female seen today for follow up of the following medical problems.    1. CAD - admit Jan 2019 with unstable angina. - Jan 2019 with distal LM 80%, prox LAD 90%. She was referred for CABG/ s/p LIMA-LAD, SVG-D1, SVG-RI - Jan 2019 echo LVEF 55-60%   - 10/2017 admitted with chest pain. Admitted with NSTEMI - 10/2017 cath with ostial LAD 45%, mid LAD 80%, D1 40%, ramus 20%, LCX 25%, RCA patent. LIMA-LAD patent, SVG-ramus occluded, SVG-D1 99%. Recs for medical therapy, discharged on DAPT with plans for 1 year of treatment.       - no chest pains, no SOB/DOE - compliant with meds     2. Hyperlipidemia  - compliant with statin - recent labs followed by pcp   01/2022 TC 81 TG 82 HDL 30 LDL 34 - 01/2023 TC 90 TG 80 HDL 36 LDL 37   3. HTN - compliant with meds     4. Breast cancer - s/p lumpectomy -she is on chemoradiation, l;umpectomy - being monitored, undergoing observation. Past Medical History:  Diagnosis Date   Arthritis    "hands sometimes" (07/13/2017)   Coronary artery disease    a. s/p CABG x3 in 06/2017 with LIMA-LAD, SVG-D1, and SVG-RI.  b. 10/2017: cath showing a widely patent LIMA-LAD with ostial occlusion of the SVG-RI and subtotally occluded atretic SVG-D1. Graft occlusion thought to be 2ry to improvement in pre-CABG stenoses.    Essential hypertension    Family history of adverse reaction to anesthesia    sister had a complication that was stated she had a "foggy" episode after her breast surgery was readmitted 1 day post op after being discharged from the hospital. Sister also has significant lung problems that could have contributed to this    Family history of breast cancer    Family history of colon cancer    Hypothyroid    Meniere disease    Myocardial infarction (HCC) 06/2017   during [facility] rehab   Obesity    Osteopenia 01/2012   T score -1.3 FRAX 7.9%/0.6%   Personal history of chemotherapy 2021   Personal  history of radiation therapy 2021   left breast     Allergies  Allergen Reactions   Aloe Vera Itching    Per patient severe itching   Sulfa Antibiotics Shortness Of Breath   Sulfasalazine Shortness Of Breath     Current Outpatient Medications  Medication Sig Dispense Refill   AREXVY 120 MCG/0.5ML injection      aspirin EC 81 MG tablet Take 81 mg by mouth daily.     atorvastatin (LIPITOR) 80 MG tablet TAKE 1 TABLET BY MOUTH ONCE DAILY AT 6PM 90 tablet 3   Cholecalciferol (VITAMIN D PO) Take 1 tablet by mouth daily.      Cyanocobalamin (VITAMIN B-12 PO) Take 1 tablet by mouth daily.     fish oil-omega-3 fatty acids 1000 MG capsule Take 1 g by mouth daily.     furosemide (LASIX) 40 MG tablet Take 40 mg by mouth daily.     levothyroxine (SYNTHROID) 25 MCG tablet Take 25 mcg by mouth daily.     levothyroxine (SYNTHROID, LEVOTHROID) 75 MCG tablet Take 75 mcg by mouth daily.     losartan (COZAAR) 25 MG tablet TAKE 1/2 TABLET EVERY DAY 45 tablet 3   methocarbamol (ROBAXIN) 500 MG tablet SMARTSIG:1 Tablet(s) By Mouth 1-3 Times Daily     metoprolol tartrate (  LOPRESSOR) 25 MG tablet TAKE 1 TABLET TWICE DAILY 180 tablet 3   nitroGLYCERIN (NITROSTAT) 0.4 MG SL tablet Place 0.4 mg under the tongue every 5 (five) minutes x 3 doses as needed for chest pain (if no relief after 3rd dose, proceed to ED or call 911).     omega-3 acid ethyl esters (LOVAZA) 1 g capsule Take by mouth.     SPIKEVAX syringe      vitamin E 100 UNIT capsule Take 1 capsule (100 Units total) by mouth daily. 30 capsule 0   No current facility-administered medications for this visit.     Past Surgical History:  Procedure Laterality Date   BREAST LUMPECTOMY WITH RADIOACTIVE SEED AND SENTINEL LYMPH NODE BIOPSY Left 09/01/2018   Procedure: LEFT BREAST LUMPECTOMY WITH RADIOACTIVE SEED AND SENTINEL LYMPH NODE BIOPSY;  Surgeon: Griselda Miner, MD;  Location: [facility] OR;  Service: General;  Laterality: Left;   BREAST SURGERY Left 2020    INVASIVE DUCTAL CARCINOMA/DUCTAL CARCINOMA IN SITU/MARGINS UNINVOLVED   CARDIAC CATHETERIZATION  07/13/2017   CATARACT EXTRACTION W/PHACO Right 01/28/2015   Procedure: CATARACT EXTRACTION PHACO AND INTRAOCULAR LENS PLACEMENT :  CDE:  5.70;  Surgeon: Jethro Bolus, MD;  Location: [facility] ORS;  Service: Ophthalmology;  Laterality: Right;   CATARACT EXTRACTION W/PHACO Left 02/11/2015   Procedure: CATARACT EXTRACTION PHACO AND INTRAOCULAR LENS PLACEMENT (IOC);  Surgeon: Jethro Bolus, MD;  Location: [facility] ORS;  Service: Ophthalmology;  Laterality: Left;  CDE: 7.38   COLONOSCOPY N/A 09/26/2012   Procedure: COLONOSCOPY;  Surgeon: Dalia Heading, MD;  Location: [facility] SUITE;  Service: Gastroenterology;  Laterality: N/A;   CORONARY ARTERY BYPASS GRAFT N/A 07/15/2017   Procedure: CORONARY ARTERY BYPASS GRAFTING (CABG) X 3 USING LEFT INTERNAL MAMMARY ARTERY AND RIGHT SAPHENOUS VEIN- ENDOSCOPICALLY HARVESTED;  Surgeon: Loreli Slot, MD;  Location: [facility] OR;  Service: Open Heart Surgery;  Laterality: N/A;   IR IMAGING GUIDED PORT INSERTION  12/29/2018   IR REMOVAL TUN ACCESS W/ PORT W/O FL MOD SED  04/26/2019   LEFT HEART CATH AND CORONARY ANGIOGRAPHY N/A 07/13/2017   Procedure: LEFT HEART CATH AND CORONARY ANGIOGRAPHY;  Surgeon: Swazil[place], Peter M, MD;  Location: [facility] INVASIVE CV LAB;  Service: Cardiovascular;  Laterality: N/A;   LEFT HEART CATH AND CORS/GRAFTS ANGIOGRAPHY N/A 11/03/2017   Procedure: LEFT HEART CATH AND CORS/GRAFTS ANGIOGRAPHY;  Surgeon: Lennette Bihari, MD;  Location: [facility] INVASIVE CV LAB;  Service: Cardiovascular;  Laterality: N/A;   TEE WITHOUT CARDIOVERSION N/A 07/15/2017   Procedure: TRANSESOPHAGEAL ECHOCARDIOGRAM (TEE);  Surgeon: Loreli Slot, MD;  Location: [facility] OR;  Service: Open Heart Surgery;  Laterality: N/A;   TUBAL LIGATION     TYMPANOPLASTY Left    fluid from ear drum     Allergies  Allergen Reactions   Aloe Vera Itching    Per patient severe itching   Sulfa  Antibiotics Shortness Of Breath   Sulfasalazine Shortness Of Breath      Family History  Problem Relation Age of Onset   Diabetes Sister        AODM   Heart disease Sister 52       CABG   Breast cancer Sister    Heart disease Brother 34       In his 36s   Colon cancer Maternal Uncle 61   Diabetes Sister        AODM   Hypertension Daughter    Hypertension Daughter    Heart attack Father  In his 7s   Stroke Mother    Stroke Maternal Grandmother    Diabetes Maternal Grandfather        d. 109   Heart attack Paternal Grandmother    Colon cancer Maternal Uncle 76   Breast cancer Cousin        dx 54s; d. 63s   Breast cancer Niece 65     Social History Ms. Todaro reports that she has never smoked. She quit smokeless tobacco use about 6 years ago.  Her smokeless tobacco use included snuff. Ms. Bradt reports that she does not currently use alcohol after a past usage of about 1.0 standard drink of alcohol per week.     Physical Examination Today's Vitals   09/15/23 0820 09/15/23 0833  BP: (!) 142/88 (!) 142/78  Pulse: 67   SpO2: 94%   Weight: 200 lb 3.2 oz (90.8 kg)   Height: 5\' 2"  (1.575 m)    Body mass index is 36.62 kg/m.  Gen: resting comfortably, no acute distress HEENT: no scleral icterus, pupils equal round and reactive, no palptable cervical adenopathy,  CV: RRR, no mrg, no jvd Resp: Clear to auscultation bilaterally GI: abdomen is soft, non-tender, non-distended, normal bowel sounds, no hepatosplenomegaly MSK: extremities are warm, no edema.  Skin: warm, no rash Neuro:  no focal deficits Psych: appropriate affect   Diagnostic Studies Jan 2019 cath Mid LM lesion is 25% stenosed. Dist LM to Ost LAD lesion is 80% stenosed. Ost Cx to Prox Cx lesion is 30% stenosed. Ost 1st Mrg lesion is 50% stenosed. Prox LAD lesion is 90% stenosed. The left ventricular systolic function is normal. LV end diastolic pressure is normal. The left ventricular  ejection fraction is 55-65% by visual estimate.   1. Single vessel obstructive CAD. Patient has complex ostial and proximal to mid LAD disease that involves the origin of the ramus intermediate and first diagonal respectively. 2. Normal LV function 3. Normal LVEDP   Plan: given complexity of lesions with ostial and bifurcation LAD disease I would recommend consideration for CABG.   Jan 2019 Carotid US Final Interpretation: Right Carotid: Velocities in the right ICA are consistent with a 1-39% stenosis.  Left Carotid: Velocities in the left ICA are consistent with a 1-39% stenosis. Vertebrals:  Both vertebral arteries were patent with antegrade flow. Subclavians:   Jan 2019 echo Study Conclusions   - Left ventricle: The cavity size was normal. Wall thickness was   increased in a pattern of mild LVH. Systolic function was normal.   The estimated ejection fraction was in the range of 55% to 60%.   Wall motion was normal; there were no regional wall motion   abnormalities. Doppler parameters are consistent with abnormal   left ventricular relaxation (grade 1 diastolic dysfunction). - Mitral valve: Valve area by pressure half-time: 1.26 cm^2.   Impressions:   - Normal LV systolic function; mild LVH; mild diastolic   dysfunction.   10/2017 cath Ost 1st Diag to 1st Diag lesion is 40% stenosed. Ost Cx lesion is 25% stenosed. Ost Ramus lesion is 20% stenosed. Ost LAD lesion is 45% stenosed. Prox LAD to Mid LAD lesion is 80% stenosed. Origin lesion is 100% stenosed. Origin lesion is 99% stenosed. Origin to Dist Graft lesion is 99% stenosed. Dist Graft lesion is 100% stenosed. LV end diastolic pressure is mildly elevated. The left ventricular systolic function is normal.   Hyperdynamic LV function with an EF of 65% without focal segmental wall motion abnormalities.  Native coronary obstructive disease with 40% ostial LAD stenosis, diffuse 40 to 50% proximal diagonal stenosis with  80% LAD stenosis diffusely after the diagonal takeoff and evidence for competitive filling to the mid LAD via the LIMA graft; 25% ostial smooth narrowing in the ramus intermediate vessel; 25% ostial smooth narrowing in the left circumflex vessel; normal RCA.   Widely patent LIMA graft which supplies the mid LAD.   Totally ostial occlusion of the SVG which had supplied the ramus intermediate vessel.   Subtotally occluded ostial vein graft with diffuse 99% narrowing insistent with an atretic graft  which does not fill the diagonal vessel.   POST CATH RECOMMENDATION: Medical therapy.  I suspect the early vein graft occlusion was contributed by improvement in the prior pre-CABG stenoses.          Assessment and Plan   1. CAD with chronic stable angina -no recent symptoms, continue current meds   2. Hyperlipidemia -very well controlled, continue current therapy   3. HTN - above goal, increase losartan to 25mg  daily. CHeck BMET in 2 weeks   F/u 6 months     Antoine Poche, M.D.

## 2023-09-17 ENCOUNTER — Ambulatory Visit
Admission: EM | Admit: 2023-09-17 | Discharge: 2023-09-17 | Disposition: A | Discharge status: Home / Self Care | Attending: Nurse Practitioner | Admitting: Nurse Practitioner

## 2023-09-17 DIAGNOSIS — Z8709 Personal history of other diseases of the respiratory system: Secondary | ICD-10-CM

## 2023-09-17 DIAGNOSIS — J309 Allergic rhinitis, unspecified: Secondary | ICD-10-CM | POA: Diagnosis not present

## 2023-09-17 DIAGNOSIS — H1012 Acute atopic conjunctivitis, left eye: Secondary | ICD-10-CM

## 2023-09-17 LAB — POCT RAPID STREP A (OFFICE): Rapid Strep A Screen: NEGATIVE

## 2023-09-17 MED ORDER — FLUTICASONE PROPIONATE 50 MCG/ACT NA SUSP: 2.0000 | Freq: Every day | NASAL | 0 refills | Status: AC

## 2023-09-17 MED ORDER — OLOPATADINE HCL 0.1 % OP SOLN: 1.0000 [drp] | Freq: Two times a day (BID) | OPHTHALMIC | 0 refills | Status: DC

## 2023-09-17 MED ORDER — MONTELUKAST SODIUM 10 MG PO TABS: 10.0000 mg | ORAL_TABLET | Freq: Every day | ORAL | 0 refills | Status: DC

## 2023-09-17 NOTE — ED Provider Notes (Signed)
 [facility]    CSN: [id number] Arrival date & time: 09/17/23  0841      History   Chief Complaint Chief Complaint  Patient presents with   Sore Throat    HPI Gloria Mccoy is a 78 y.o. female.   The history is provided by the patient.   Patient presents for complaints of left eye redness, tearing, nasal congestion, runny nose, bilateral ear pressure, and sore throat.  Symptoms started 2 days ago.  Denies fever, chills, ear drainage, cough, chest pain, abdominal pain, nausea, vomiting, diarrhea, or rash.  Patient endorses underlying history of seasonal allergies.  States that she has been outside for long periods of time over the past week.  Reports that she does take Allegra daily.  Past Medical History:  Diagnosis Date   Arthritis    "hands sometimes" (07/13/2017)   Coronary artery disease    a. s/p CABG x3 in 06/2017 with LIMA-LAD, SVG-D1, and SVG-RI.  b. 10/2017: cath showing a widely patent LIMA-LAD with ostial occlusion of the SVG-RI and subtotally occluded atretic SVG-D1. Graft occlusion thought to be 2ry to improvement in pre-CABG stenoses.    Essential hypertension    Family history of adverse reaction to anesthesia    sister had a complication that was stated she had a "foggy" episode after her breast surgery was readmitted 1 day post op after being discharged from the hospital. Sister also has significant lung problems that could have contributed to this    Family history of breast cancer    Family history of colon cancer    Hypothyroid    Meniere disease    Myocardial infarction (HCC) 06/2017   during [facility]   Obesity    Osteopenia 01/2012   T score -1.3 FRAX 7.9%/0.6%   Personal history of chemotherapy 2021   Personal history of radiation therapy 2021   left breast    Patient Active Problem List   Diagnosis Date Noted   Port-A-Cath in place 01/16/2019   Genetic testing 08/29/2018   Family history of breast cancer    Family history of  colon cancer    Malignant neoplasm of lower-outer quadrant of left breast of female, estrogen receptor negative (HCC) 08/16/2018   NSTEMI (non-ST elevated myocardial infarction) (HCC) 11/02/2017   S/P CABG x 3 07/15/2017   Coronary artery disease 07/15/2017   Unstable angina (HCC) 07/13/2017   Essential hypertension 07/13/2017   Obesity 07/13/2017   Hyperglycemia 07/13/2017   Hypothyroid 01/06/2012   Meniere disease    Arthritis     Past Surgical History:  Procedure Laterality Date   BREAST LUMPECTOMY WITH RADIOACTIVE SEED AND SENTINEL LYMPH NODE BIOPSY Left 09/01/2018   Procedure: LEFT BREAST LUMPECTOMY WITH RADIOACTIVE SEED AND SENTINEL LYMPH NODE BIOPSY;  Surgeon: Griselda Miner, MD;  Location: [facility] OR;  Service: General;  Laterality: Left;   BREAST SURGERY Left 2020   INVASIVE DUCTAL CARCINOMA/DUCTAL CARCINOMA IN SITU/MARGINS UNINVOLVED   CARDIAC CATHETERIZATION  07/13/2017   CATARACT EXTRACTION W/PHACO Right 01/28/2015   Procedure: CATARACT EXTRACTION PHACO AND INTRAOCULAR LENS PLACEMENT :  CDE:  5.70;  Surgeon: Jethro Bolus, MD;  Location: [facility] ORS;  Service: Ophthalmology;  Laterality: Right;   CATARACT EXTRACTION W/PHACO Left 02/11/2015   Procedure: CATARACT EXTRACTION PHACO AND INTRAOCULAR LENS PLACEMENT (IOC);  Surgeon: Jethro Bolus, MD;  Location: [facility]S;  Service: Ophthalmology;  Laterality: Left;  CDE: 7.38   COLONOSCOPY N/A 09/26/2012   Procedure: COLONOSCOPY;  Surgeon: Dalia Heading, MD;  Location: [facility] SUITE;  Service: Gastroenterology;  Laterality: N/A;   CORONARY ARTERY BYPASS GRAFT N/A 07/15/2017   Procedure: CORONARY ARTERY BYPASS GRAFTING (CABG) X 3 USING LEFT INTERNAL MAMMARY ARTERY AND RIGHT SAPHENOUS VEIN- ENDOSCOPICALLY HARVESTED;  Surgeon: Loreli Slot, MD;  Location: [facility] OR;  Service: Open Heart Surgery;  Laterality: N/A;   IR IMAGING GUIDED PORT INSERTION  12/29/2018   IR REMOVAL TUN ACCESS W/ PORT W/O FL MOD SED  04/26/2019   LEFT HEART CATH AND  CORONARY ANGIOGRAPHY N/A 07/13/2017   Procedure: LEFT HEART CATH AND CORONARY ANGIOGRAPHY;  Surgeon: Swaziland, Peter M, MD;  Location: [facility] INVASIVE CV LAB;  Service: Cardiovascular;  Laterality: N/A;   LEFT HEART CATH AND CORS/GRAFTS ANGIOGRAPHY N/A 11/03/2017   Procedure: LEFT HEART CATH AND CORS/GRAFTS ANGIOGRAPHY;  Surgeon: Lennette Bihari, MD;  Location: [facility] INVASIVE CV LAB;  Service: Cardiovascular;  Laterality: N/A;   TEE WITHOUT CARDIOVERSION N/A 07/15/2017   Procedure: TRANSESOPHAGEAL ECHOCARDIOGRAM (TEE);  Surgeon: Loreli Slot, MD;  Location: [facility] OR;  Service: Open Heart Surgery;  Laterality: N/A;   TUBAL LIGATION     TYMPANOPLASTY Left    fluid from ear drum    OB History     Gravida  2   Para  2   Term  2   Preterm  0   AB  0   Living         SAB  0   IAB  0   Ectopic  0   Multiple      Live Births               Home Medications    Prior to Admission medications   Medication Sig Start Date End Date Taking? Authorizing Provider  AREXVY 120 MCG/0.5ML injection  05/24/22   [provider]  aspirin EC 81 MG tablet Take 81 mg by mouth daily.    [provider]  atorvastatin (LIPITOR) 80 MG tablet TAKE 1 TABLET BY MOUTH ONCE DAILY AT [facility] 12/09/22   Antoine Poche, MD  Cholecalciferol (VITAMIN D PO) Take 1 tablet by mouth daily.     [provider]  Cyanocobalamin (VITAMIN B-12 PO) Take 1 tablet by mouth daily.    [provider]  fish oil-omega-3 fatty acids 1000 MG capsule Take 1 g by mouth daily.    [provider]  furosemide (LASIX) 40 MG tablet Take 40 mg by mouth daily.    [provider]  levothyroxine (SYNTHROID) 25 MCG tablet Take 25 mcg by mouth daily. 07/15/21   [provider]  levothyroxine (SYNTHROID, LEVOTHROID) 75 MCG tablet Take 75 mcg by mouth daily.    [provider]  losartan (COZAAR) 25 MG tablet Take 1 tablet (25 mg total) by mouth daily. 09/15/23   Antoine Poche, MD  methocarbamol (ROBAXIN) 500 MG tablet SMARTSIG:1 Tablet(s) By Mouth 1-3 Times Daily 02/17/23   [provider]  metoprolol tartrate (LOPRESSOR) 25 MG tablet TAKE 1 TABLET TWICE DAILY 12/24/22   Antoine Poche, MD  nitroGLYCERIN (NITROSTAT) 0.4 MG SL tablet Place 0.4 mg under the tongue every 5 (five) minutes x 3 doses as needed for chest pain (if no relief after 3rd dose, proceed to ED or call 911).    [provider]  omega-3 acid ethyl esters (LOVAZA) 1 g capsule Take by mouth. 09/07/16   [provider]  [facility] syringe  04/07/22   [provider]  vitamin E  100 UNIT capsule Take 1 capsule (100 Units total) by mouth daily. 03/15/19   [name], NP    Family History Family History  Problem Relation Age of Onset   Diabetes Sister        AODM   Heart disease Sister 20       CABG   Breast cancer Sister    Heart disease Brother 69       In his 51s   Colon cancer Maternal Uncle 59   Diabetes Sister        AODM   Hypertension Daughter    Hypertension Daughter    Heart attack Father        In his 105s   Stroke Mother    Stroke Maternal Grandmother    Diabetes Maternal Grandfather        d. 109   Heart attack Paternal Grandmother    Colon cancer Maternal Uncle 73   Breast cancer Cousin        dx 40s; d. 49s   Breast cancer Niece 80    Social History Social History   Tobacco Use   Smoking status: Never   Smokeless tobacco: Former    Types: Snuff    Quit date: 07/31/2017  Vaping Use   Vaping status: Never Used  Substance Use Topics   Alcohol use: Not Currently    Alcohol/week: 1.0 standard drink of alcohol    Types: 1 Standard drinks or equivalent per week    Comment: occasionally   Drug use: No     Allergies   Aloe vera, Sulfa antibiotics, and Sulfasalazine   Review of Systems Review of Systems Per HPI  Physical Exam Triage Vital Signs ED Triage Vitals  Encounter Vitals Group     BP 09/17/23  0851 (!) 164/78     Systolic BP Percentile --      Diastolic BP Percentile --      Pulse Rate 09/17/23 0851 79     Resp 09/17/23 0851 20     Temp 09/17/23 0851 98.7 F (37.1 C)     Temp Source 09/17/23 0851 Oral     SpO2 09/17/23 0851 90 %     Weight --      Height --      Head Circumference --      Peak Flow --      Pain Score 09/17/23 0853 10     Pain Loc --      Pain Education --      Exclude from Growth Chart --    No data found.  Updated Vital Signs BP (!) 164/78 (BP Location: Right Arm)   Pulse 79   Temp 98.7 F (37.1 C) (Oral)   Resp 20   SpO2 90%   Visual Acuity Right Eye Distance:   Left Eye Distance:   Bilateral Distance:    Right Eye Near:   Left Eye Near:    Bilateral Near:     Physical Exam Vitals and nursing note reviewed.  Constitutional:      General: She is not in acute distress.    Appearance: She is well-developed.  HENT:     Head: Normocephalic.     Right Ear: Tympanic membrane, ear canal and external ear normal.     Left Ear: Tympanic membrane, ear canal and external ear normal.     Nose: Congestion present.     Right Turbinates: Enlarged and swollen.     Left Turbinates: Enlarged and swollen.  Right Sinus: No maxillary sinus tenderness or frontal sinus tenderness.     Left Sinus: No maxillary sinus tenderness or frontal sinus tenderness.     Mouth/Throat:     Lips: Pink.     Mouth: Mucous membranes are moist.     Pharynx: Uvula midline. Postnasal drip present. No pharyngeal swelling, oropharyngeal exudate, posterior oropharyngeal erythema or uvula swelling.     Comments: Cobblestoning present to posterior oropharynx  Eyes:     General: Vision grossly intact. Allergic shiner present.        Left eye: Discharge (clear, tearing) present.    Extraocular Movements: Extraocular movements intact.     Left eye: Normal extraocular motion and no nystagmus.     Conjunctiva/sclera:     Left eye: Left conjunctiva is injected. No chemosis,  exudate or hemorrhage.    Pupils: Pupils are equal, round, and reactive to light.  Cardiovascular:     Rate and Rhythm: Normal rate and regular rhythm.     Pulses: Normal pulses.     Heart sounds: Normal heart sounds.  Pulmonary:     Effort: Pulmonary effort is normal.     Breath sounds: Normal breath sounds.  Abdominal:     General: Bowel sounds are normal.     Palpations: Abdomen is soft.     Tenderness: There is no abdominal tenderness.  Musculoskeletal:     Cervical back: Normal range of motion.  Lymphadenopathy:     Cervical: No cervical adenopathy.  Skin:    General: Skin is warm and dry.  Neurological:     General: No focal deficit present.     Mental Status: She is alert and oriented to person, place, and time.  Psychiatric:        Mood and Affect: Mood normal.        Behavior: Behavior normal.      UC Treatments / Results  Labs (all labs ordered are listed, but only abnormal results are displayed) Labs Reviewed  POCT RAPID STREP A (OFFICE)    EKG   Radiology No results found.  Procedures Procedures (including critical care time)  Medications Ordered in UC Medications - No data to display  Initial Impression / Assessment and Plan / UC Course  I have reviewed the triage vital signs and the nursing notes.  Pertinent labs & imaging results that were available during my care of the patient were reviewed by me and considered in my medical decision making (see chart for details).  Rapid strep test was negative, no cultures indicated at this time.  Patient's symptoms are consistent with allergic rhinitis and allergic conjunctivitis.  Will treat with Singulair 10 mg for allergies, fluticasone 50 micro nasal spray for nasal congestion and runny nose, and Pataday 0.1% ophthalmic solution for eye irritation and redness.  Supportive care recommendations were provided and discussed with the patient to include over-the-counter analgesics, cool compresses to the eyes,  avoiding allergy triggers, and possibly wearing a mask when she is outside.  Discussed indication regarding follow-up.  Patient was in agreement with this plan of care and verbalizes understanding.  All questions were answered.  Patient stable for discharge.  Final Clinical Impressions(s) / UC Diagnoses   Final diagnoses:  None   Discharge Instructions   None    ED Prescriptions   None    PDMP not reviewed this encounter.   Abran Cantor, NP 09/17/23 [phone]

## 2023-09-17 NOTE — ED Triage Notes (Signed)
 Pt reports she has throat pain and ear pain x 2 days States it hurts to swallow and has a left red eye.

## 2023-09-17 NOTE — Discharge Instructions (Addendum)
 The rapid strep test was negative.  Take medication as prescribed. Increase fluids and allow for plenty of rest. May take over-the-counter Tylenol as needed for pain, fever, or general discomfort. Apply cool compresses to the eyes to help with irritation and redness. Avoid allergy triggers.  When you are outside, you may need to wear a mask until symptoms improve. Warm salt water gargles 3-4 times daily as needed for throat pain or discomfort. May use Visine or Clear Eyes to help keep the eyes moist and lubricated. Recommend use of normal saline nasal spray throughout the day for nasal congestion and runny nose. As discussed, if symptoms fail to improve, or appear to be worsening, you may follow-up in this clinic or with your primary care physician for further evaluation. Follow-up as needed.

## 2023-09-20 ENCOUNTER — Inpatient Hospital Stay (HOSPITAL_BASED_OUTPATIENT_CLINIC_OR_DEPARTMENT_OTHER)
Admission: EM | Admit: 2023-09-20 | Discharge: 2023-09-23 | DRG: 291 | Disposition: A | Discharge status: Home / Self Care | Attending: Internal Medicine | Admitting: Internal Medicine

## 2023-09-20 ENCOUNTER — Encounter (HOSPITAL_BASED_OUTPATIENT_CLINIC_OR_DEPARTMENT_OTHER): Payer: Self-pay | Admitting: Emergency Medicine

## 2023-09-20 ENCOUNTER — Emergency Department (HOSPITAL_BASED_OUTPATIENT_CLINIC_OR_DEPARTMENT_OTHER): Admitting: Radiology

## 2023-09-20 ENCOUNTER — Other Ambulatory Visit: Payer: Self-pay

## 2023-09-20 ENCOUNTER — Emergency Department (HOSPITAL_BASED_OUTPATIENT_CLINIC_OR_DEPARTMENT_OTHER)

## 2023-09-20 DIAGNOSIS — I5033 Acute on chronic diastolic (congestive) heart failure: Secondary | ICD-10-CM | POA: Diagnosis present

## 2023-09-20 DIAGNOSIS — Z9861 Coronary angioplasty status: Secondary | ICD-10-CM | POA: Diagnosis not present

## 2023-09-20 DIAGNOSIS — Z853 Personal history of malignant neoplasm of breast: Secondary | ICD-10-CM

## 2023-09-20 DIAGNOSIS — J9601 Acute respiratory failure with hypoxia: Secondary | ICD-10-CM | POA: Diagnosis not present

## 2023-09-20 DIAGNOSIS — M19041 Primary osteoarthritis, right hand: Secondary | ICD-10-CM | POA: Diagnosis present

## 2023-09-20 DIAGNOSIS — R7303 Prediabetes: Secondary | ICD-10-CM | POA: Diagnosis present

## 2023-09-20 DIAGNOSIS — I10 Essential (primary) hypertension: Secondary | ICD-10-CM | POA: Diagnosis not present

## 2023-09-20 DIAGNOSIS — Z803 Family history of malignant neoplasm of breast: Secondary | ICD-10-CM

## 2023-09-20 DIAGNOSIS — R0902 Hypoxemia: Secondary | ICD-10-CM | POA: Diagnosis not present

## 2023-09-20 DIAGNOSIS — Z91048 Other nonmedicinal substance allergy status: Secondary | ICD-10-CM

## 2023-09-20 DIAGNOSIS — Z951 Presence of aortocoronary bypass graft: Secondary | ICD-10-CM

## 2023-09-20 DIAGNOSIS — J168 Pneumonia due to other specified infectious organisms: Secondary | ICD-10-CM | POA: Diagnosis not present

## 2023-09-20 DIAGNOSIS — J810 Acute pulmonary edema: Secondary | ICD-10-CM | POA: Diagnosis not present

## 2023-09-20 DIAGNOSIS — Z86000 Personal history of in-situ neoplasm of breast: Secondary | ICD-10-CM

## 2023-09-20 DIAGNOSIS — R079 Chest pain, unspecified: Secondary | ICD-10-CM | POA: Diagnosis not present

## 2023-09-20 DIAGNOSIS — Z6836 Body mass index (BMI) 36.0-36.9, adult: Secondary | ICD-10-CM

## 2023-09-20 DIAGNOSIS — I110 Hypertensive heart disease with heart failure: Principal | ICD-10-CM | POA: Diagnosis present

## 2023-09-20 DIAGNOSIS — R739 Hyperglycemia, unspecified: Secondary | ICD-10-CM | POA: Diagnosis not present

## 2023-09-20 DIAGNOSIS — M8580 Other specified disorders of bone density and structure, unspecified site: Secondary | ICD-10-CM | POA: Diagnosis present

## 2023-09-20 DIAGNOSIS — T380X5A Adverse effect of glucocorticoids and synthetic analogues, initial encounter: Secondary | ICD-10-CM | POA: Diagnosis present

## 2023-09-20 DIAGNOSIS — E669 Obesity, unspecified: Secondary | ICD-10-CM | POA: Diagnosis not present

## 2023-09-20 DIAGNOSIS — J189 Pneumonia, unspecified organism: Secondary | ICD-10-CM

## 2023-09-20 DIAGNOSIS — R011 Cardiac murmur, unspecified: Secondary | ICD-10-CM | POA: Diagnosis present

## 2023-09-20 DIAGNOSIS — E039 Hypothyroidism, unspecified: Secondary | ICD-10-CM | POA: Diagnosis not present

## 2023-09-20 DIAGNOSIS — Z833 Family history of diabetes mellitus: Secondary | ICD-10-CM | POA: Diagnosis not present

## 2023-09-20 DIAGNOSIS — R059 Cough, unspecified: Secondary | ICD-10-CM | POA: Diagnosis not present

## 2023-09-20 DIAGNOSIS — Z79899 Other long term (current) drug therapy: Secondary | ICD-10-CM

## 2023-09-20 DIAGNOSIS — Z7982 Long term (current) use of aspirin: Secondary | ICD-10-CM

## 2023-09-20 DIAGNOSIS — Z9841 Cataract extraction status, right eye: Secondary | ICD-10-CM

## 2023-09-20 DIAGNOSIS — M19042 Primary osteoarthritis, left hand: Secondary | ICD-10-CM | POA: Diagnosis present

## 2023-09-20 DIAGNOSIS — E785 Hyperlipidemia, unspecified: Secondary | ICD-10-CM | POA: Diagnosis not present

## 2023-09-20 DIAGNOSIS — R918 Other nonspecific abnormal finding of lung field: Secondary | ICD-10-CM | POA: Diagnosis not present

## 2023-09-20 DIAGNOSIS — Z9851 Tubal ligation status: Secondary | ICD-10-CM

## 2023-09-20 DIAGNOSIS — I2510 Atherosclerotic heart disease of native coronary artery without angina pectoris: Secondary | ICD-10-CM | POA: Diagnosis present

## 2023-09-20 DIAGNOSIS — Z1152 Encounter for screening for COVID-19: Secondary | ICD-10-CM

## 2023-09-20 DIAGNOSIS — Z720 Tobacco use: Secondary | ICD-10-CM

## 2023-09-20 DIAGNOSIS — Z9842 Cataract extraction status, left eye: Secondary | ICD-10-CM | POA: Diagnosis not present

## 2023-09-20 DIAGNOSIS — I5030 Unspecified diastolic (congestive) heart failure: Secondary | ICD-10-CM | POA: Diagnosis present

## 2023-09-20 DIAGNOSIS — Z9889 Other specified postprocedural states: Secondary | ICD-10-CM

## 2023-09-20 DIAGNOSIS — R6883 Chills (without fever): Secondary | ICD-10-CM | POA: Diagnosis not present

## 2023-09-20 DIAGNOSIS — Z800 Family history of malignant neoplasm of digestive organs: Secondary | ICD-10-CM

## 2023-09-20 DIAGNOSIS — R062 Wheezing: Secondary | ICD-10-CM

## 2023-09-20 DIAGNOSIS — Z79890 Hormone replacement therapy (postmenopausal): Secondary | ICD-10-CM | POA: Diagnosis not present

## 2023-09-20 DIAGNOSIS — Z9221 Personal history of antineoplastic chemotherapy: Secondary | ICD-10-CM

## 2023-09-20 DIAGNOSIS — Z8249 Family history of ischemic heart disease and other diseases of the circulatory system: Secondary | ICD-10-CM | POA: Diagnosis not present

## 2023-09-20 DIAGNOSIS — E66812 Obesity, class 2: Secondary | ICD-10-CM | POA: Diagnosis not present

## 2023-09-20 DIAGNOSIS — Z882 Allergy status to sulfonamides status: Secondary | ICD-10-CM

## 2023-09-20 DIAGNOSIS — Z961 Presence of intraocular lens: Secondary | ICD-10-CM | POA: Diagnosis present

## 2023-09-20 DIAGNOSIS — Z923 Personal history of irradiation: Secondary | ICD-10-CM

## 2023-09-20 DIAGNOSIS — I252 Old myocardial infarction: Secondary | ICD-10-CM

## 2023-09-20 DIAGNOSIS — R6510 Systemic inflammatory response syndrome (SIRS) of non-infectious origin without acute organ dysfunction: Secondary | ICD-10-CM | POA: Diagnosis not present

## 2023-09-20 DIAGNOSIS — J9811 Atelectasis: Secondary | ICD-10-CM | POA: Diagnosis not present

## 2023-09-20 DIAGNOSIS — J42 Unspecified chronic bronchitis: Secondary | ICD-10-CM | POA: Diagnosis not present

## 2023-09-20 DIAGNOSIS — I5031 Acute diastolic (congestive) heart failure: Secondary | ICD-10-CM | POA: Diagnosis not present

## 2023-09-20 DIAGNOSIS — E876 Hypokalemia: Secondary | ICD-10-CM | POA: Diagnosis not present

## 2023-09-20 DIAGNOSIS — Z823 Family history of stroke: Secondary | ICD-10-CM

## 2023-09-20 LAB — CBC WITH DIFFERENTIAL/PLATELET
Abs Immature Granulocytes: 0.05 10*3/uL (ref 0.00–0.07)
Basophils Absolute: 0.1 10*3/uL (ref 0.0–0.1)
Basophils Relative: 0 %
Eosinophils Absolute: 0.5 10*3/uL (ref 0.0–0.5)
Eosinophils Relative: 4 %
HCT: 35.4 % — ABNORMAL LOW (ref 36.0–46.0)
Hemoglobin: 12.8 g/dL (ref 12.0–15.0)
Immature Granulocytes: 0 %
Lymphocytes Relative: 18 %
Lymphs Abs: 2.4 10*3/uL (ref 0.7–4.0)
MCH: 30.1 pg (ref 26.0–34.0)
MCHC: 36.2 g/dL — ABNORMAL HIGH (ref 30.0–36.0)
MCV: 83.3 fL (ref 80.0–100.0)
Monocytes Absolute: 1.5 10*3/uL — ABNORMAL HIGH (ref 0.1–1.0)
Monocytes Relative: 11 %
Neutro Abs: 8.8 10*3/uL — ABNORMAL HIGH (ref 1.7–7.7)
Neutrophils Relative %: 67 %
Platelets: 246 10*3/uL (ref 150–400)
RBC: 4.25 MIL/uL (ref 3.87–5.11)
RDW: 14.1 % (ref 11.5–15.5)
WBC: 13.3 10*3/uL — ABNORMAL HIGH (ref 4.0–10.5)
nRBC: 0 % (ref 0.0–0.2)

## 2023-09-20 LAB — RESP PANEL BY RT-PCR (RSV, FLU A&B, COVID)  RVPGX2
Influenza A by PCR: NEGATIVE
Influenza B by PCR: NEGATIVE
Resp Syncytial Virus by PCR: NEGATIVE
SARS Coronavirus 2 by RT PCR: NEGATIVE

## 2023-09-20 LAB — BRAIN NATRIURETIC PEPTIDE: B Natriuretic Peptide: 71.7 pg/mL (ref 0.0–100.0)

## 2023-09-20 LAB — COMPREHENSIVE METABOLIC PANEL WITH GFR
ALT: 16 U/L (ref 0–44)
AST: 24 U/L (ref 15–41)
Albumin: 3.5 g/dL (ref 3.5–5.0)
Alkaline Phosphatase: 65 U/L (ref 38–126)
Anion gap: 9 (ref 5–15)
BUN: 14 mg/dL (ref 8–23)
CO2: 31 mmol/L (ref 22–32)
Calcium: 8.7 mg/dL — ABNORMAL LOW (ref 8.9–10.3)
Chloride: 98 mmol/L (ref 98–111)
Creatinine, Ser: 0.67 mg/dL (ref 0.44–1.00)
GFR, Estimated: >60 mL/min (ref >60)
Glucose, Bld: 119 mg/dL — ABNORMAL HIGH (ref 70–99)
Potassium: 3.3 mmol/L — ABNORMAL LOW (ref 3.5–5.1)
Sodium: 138 mmol/L (ref 135–145)
Total Bilirubin: 0.9 mg/dL (ref 0.0–1.2)
Total Protein: 7.3 g/dL (ref 6.5–8.1)

## 2023-09-20 LAB — D-DIMER, QUANTITATIVE: D-Dimer, Quant: 1.93 ug{FEU}/mL — ABNORMAL HIGH (ref 0.00–0.50)

## 2023-09-20 LAB — GROUP A STREP BY PCR: Group A Strep by PCR: NOT DETECTED

## 2023-09-20 LAB — TROPONIN I (HIGH SENSITIVITY): Troponin I (High Sensitivity): 14 ng/L (ref <18)

## 2023-09-20 MED ORDER — METHYLPREDNISOLONE SODIUM SUCC 125 MG IJ SOLR
125.0000 mg | INTRAMUSCULAR | Status: AC
  Administered 2023-09-20: 125 mg via INTRAVENOUS
  Filled 2023-09-20: qty 2

## 2023-09-20 MED ORDER — IPRATROPIUM-ALBUTEROL 0.5-2.5 (3) MG/3ML IN SOLN
3.0000 mL | RESPIRATORY_TRACT | Status: AC
  Administered 2023-09-20: 3 mL via RESPIRATORY_TRACT
  Filled 2023-09-20: qty 3

## 2023-09-20 MED ORDER — ASPIRIN 81 MG PO CHEW
162.0000 mg | CHEWABLE_TABLET | Freq: Once | ORAL | Status: AC
  Administered 2023-09-20: 162 mg via ORAL
  Filled 2023-09-20: qty 2

## 2023-09-20 MED ORDER — IOHEXOL 350 MG/ML SOLN
75.0000 mL | Freq: Once | INTRAVENOUS | Status: AC | PRN
  Administered 2023-09-20: 75 mL via INTRAVENOUS

## 2023-09-20 MED ORDER — IPRATROPIUM-ALBUTEROL 0.5-2.5 (3) MG/3ML IN SOLN
3.0000 mL | Freq: Once | RESPIRATORY_TRACT | Status: AC
  Administered 2023-09-20: 3 mL via RESPIRATORY_TRACT
  Filled 2023-09-20: qty 3

## 2023-09-20 NOTE — ED Triage Notes (Signed)
 Sore throat, cough chills chest pain since Friday  Seen at [facility] on Friday   "Whatever they gave me is not working, my chest feel like I'm a having a heart attack"

## 2023-09-20 NOTE — ED Provider Notes (Signed)
 [facility] AT [facility] Provider Note   CSN: [id number] Arrival date & time: 09/20/23  2109     History {Add pertinent medical, surgical, social history, OB history to HPI:1} Chief Complaint  Patient presents with   Chest Pain    Gloria Mccoy is a 78 y.o. female.  78 year old female with history of CAD status post PCI and CABG, hypertension, hyperlipidemia, and breast cancer who presents to the emergency department with URI type symptoms and chest pain.  Patient reports that since Friday she has been having congestion, runny nose, sore throat, and a productive cough.  Also reports that she has had chest pain.  Says its intermittent.  It is severe.  Feels like pressure.  No diaphoresis or vomiting.  Has had subjective fevers though.       Home Medications Prior to Admission medications   Medication Sig Start Date End Date Taking? Authorizing Provider  AREXVY 120 MCG/0.5ML injection  05/24/22   [provider]  aspirin EC 81 MG tablet Take 81 mg by mouth daily.    [provider]  atorvastatin (LIPITOR) 80 MG tablet TAKE 1 TABLET BY MOUTH ONCE DAILY AT [facility] 12/09/22   Antoine Poche, MD  Cholecalciferol (VITAMIN D PO) Take 1 tablet by mouth daily.     [provider]  Cyanocobalamin (VITAMIN B-12 PO) Take 1 tablet by mouth daily.    [provider]  fish oil-omega-3 fatty acids 1000 MG capsule Take 1 g by mouth daily.    [provider]  fluticasone (FLONASE) 50 MCG/ACT nasal spray Place 2 sprays into both nostrils daily. 09/17/23   Leath-Warren, Sadie Haber, NP  furosemide (LASIX) 40 MG tablet Take 40 mg by mouth daily.    [provider]  levothyroxine (SYNTHROID) 25 MCG tablet Take 25 mcg by mouth daily. 07/15/21   [provider]  levothyroxine (SYNTHROID, LEVOTHROID) 75 MCG tablet Take 75 mcg by mouth daily.    [provider]  losartan (COZAAR) 25 MG tablet Take 1 tablet  (25 mg total) by mouth daily. 09/15/23   Antoine Poche, MD  methocarbamol (ROBAXIN) 500 MG tablet SMARTSIG:1 Tablet(s) By Mouth 1-3 Times Daily 02/17/23   [provider]  metoprolol tartrate (LOPRESSOR) 25 MG tablet TAKE 1 TABLET TWICE DAILY 12/24/22   Antoine Poche, MD  montelukast (SINGULAIR) 10 MG tablet Take 1 tablet (10 mg total) by mouth at bedtime. 09/17/23   Leath-Warren, Sadie Haber, NP  nitroGLYCERIN (NITROSTAT) 0.4 MG SL tablet Place 0.4 mg under the tongue every 5 (five) minutes x 3 doses as needed for chest pain (if no relief after 3rd dose, proceed to ED or call 911).    [provider]  olopatadine (PATADAY) 0.1 % ophthalmic solution Place 1 drop into both eyes 2 (two) times daily. 09/17/23   Leath-Warren, Sadie Haber, NP  omega-3 acid ethyl esters (LOVAZA) 1 g capsule Take by mouth. 09/07/16   [provider]  [facility] syringe  04/07/22   [provider]  vitamin E 100 UNIT capsule Take 1 capsule (100 Units total) by mouth daily. 03/15/19   [name], NP      Allergies    Aloe vera, Sulfa antibiotics, and Sulfasalazine    Review of Systems   Review of Systems  Physical Exam Updated Vital Signs BP 129/64 (BP Location: Right Arm)   Pulse 82   Temp 98.9 F (37.2 C)   Resp (!) 22  SpO2 96%  Physical Exam Vitals and nursing note reviewed.  Constitutional:      General: She is not in acute distress.    Appearance: She is well-developed.  HENT:     Head: Normocephalic and atraumatic.     Right Ear: External ear normal.     Left Ear: External ear normal.     Nose: Nose normal.  Eyes:     Extraocular Movements: Extraocular movements intact.     Conjunctiva/sclera: Conjunctivae normal.     Pupils: Pupils are equal, round, and reactive to light.  Cardiovascular:     Rate and Rhythm: Normal rate and regular rhythm.     Heart sounds: No murmur heard. Pulmonary:     Effort: Pulmonary effort is normal. No respiratory  distress.     Breath sounds: Wheezing (Diffuse) present.     Comments: New 2 L oxygen requirement Musculoskeletal:     Cervical back: Normal range of motion and neck supple.     Right lower leg: Edema present.     Left lower leg: Edema present.  Skin:    General: Skin is warm and dry.  Neurological:     Mental Status: She is alert and oriented to person, place, and time. Mental status is at baseline.  Psychiatric:        Mood and Affect: Mood normal.     ED Results / Procedures / Treatments   Labs (all labs ordered are listed, but only abnormal results are displayed) Labs Reviewed  RESP PANEL BY RT-PCR (RSV, FLU A&B, COVID)  [id number]  GROUP A STREP BY PCR  CBC WITH DIFFERENTIAL/PLATELET  COMPREHENSIVE METABOLIC PANEL WITH GFR  BRAIN NATRIURETIC PEPTIDE  TROPONIN I (HIGH SENSITIVITY)    EKG None  Radiology DG Chest Port 1 View Result Date: 09/20/2023 CLINICAL DATA:  Cough, chills, chest pain EXAM: PORTABLE CHEST 1 VIEW COMPARISON:  07/20/2023 FINDINGS: Prior CABG. Heart is borderline in size. No confluent airspace opacities, effusions or edema. No acute bony abnormality. Degenerative changes in the shoulders. IMPRESSION: No active disease. Electronically Signed   By: Charlett Nose M.D.   On: 09/20/2023 21:57    Procedures Procedures  {Document cardiac monitor, telemetry assessment procedure when appropriate:1}  Medications Ordered in ED Medications  ipratropium-albuterol (DUONEB) 0.5-2.5 (3) MG/3ML nebulizer solution 3 mL (3 mLs Nebulization Given 09/20/23 2152)    ED Course/ Medical Decision Making/ A&P   {   Click here for ABCD2, HEART and other calculatorsREFRESH Note before signing :1}                              Medical Decision Making Amount and/or Complexity of Data Reviewed Labs: ordered. Radiology: ordered.  Risk Prescription drug management.   ***  {Document critical care time when appropriate:1} {Document review of labs and clinical decision tools ie  heart score, Chads2Vasc2 etc:1}  {Document your independent review of radiology images, and any outside records:1} {Document your discussion with family members, caretakers, and with consultants:1} {Document social determinants of health affecting pt's care:1} {Document your decision making why or why not admission, treatments were needed:1} Final Clinical Impression(s) / ED Diagnoses Final diagnoses:  None    Rx / DC Orders ED Discharge Orders     None

## 2023-09-20 NOTE — ED Notes (Signed)
 Patient back from CT.

## 2023-09-21 ENCOUNTER — Inpatient Hospital Stay (HOSPITAL_COMMUNITY)

## 2023-09-21 DIAGNOSIS — E876 Hypokalemia: Secondary | ICD-10-CM | POA: Diagnosis present

## 2023-09-21 DIAGNOSIS — I10 Essential (primary) hypertension: Secondary | ICD-10-CM

## 2023-09-21 DIAGNOSIS — I110 Hypertensive heart disease with heart failure: Secondary | ICD-10-CM | POA: Diagnosis present

## 2023-09-21 DIAGNOSIS — R6510 Systemic inflammatory response syndrome (SIRS) of non-infectious origin without acute organ dysfunction: Secondary | ICD-10-CM | POA: Diagnosis not present

## 2023-09-21 DIAGNOSIS — J9601 Acute respiratory failure with hypoxia: Secondary | ICD-10-CM | POA: Diagnosis present

## 2023-09-21 DIAGNOSIS — Z9861 Coronary angioplasty status: Secondary | ICD-10-CM | POA: Diagnosis not present

## 2023-09-21 DIAGNOSIS — R7303 Prediabetes: Secondary | ICD-10-CM | POA: Diagnosis present

## 2023-09-21 DIAGNOSIS — I5030 Unspecified diastolic (congestive) heart failure: Secondary | ICD-10-CM | POA: Diagnosis present

## 2023-09-21 DIAGNOSIS — Z833 Family history of diabetes mellitus: Secondary | ICD-10-CM | POA: Diagnosis not present

## 2023-09-21 DIAGNOSIS — E785 Hyperlipidemia, unspecified: Secondary | ICD-10-CM | POA: Diagnosis present

## 2023-09-21 DIAGNOSIS — J42 Unspecified chronic bronchitis: Secondary | ICD-10-CM | POA: Diagnosis present

## 2023-09-21 DIAGNOSIS — M19042 Primary osteoarthritis, left hand: Secondary | ICD-10-CM | POA: Diagnosis present

## 2023-09-21 DIAGNOSIS — E039 Hypothyroidism, unspecified: Secondary | ICD-10-CM | POA: Diagnosis present

## 2023-09-21 DIAGNOSIS — E669 Obesity, unspecified: Secondary | ICD-10-CM | POA: Diagnosis not present

## 2023-09-21 DIAGNOSIS — Z8249 Family history of ischemic heart disease and other diseases of the circulatory system: Secondary | ICD-10-CM | POA: Diagnosis not present

## 2023-09-21 DIAGNOSIS — I5031 Acute diastolic (congestive) heart failure: Secondary | ICD-10-CM | POA: Diagnosis not present

## 2023-09-21 DIAGNOSIS — Z1152 Encounter for screening for COVID-19: Secondary | ICD-10-CM | POA: Diagnosis not present

## 2023-09-21 DIAGNOSIS — I2510 Atherosclerotic heart disease of native coronary artery without angina pectoris: Secondary | ICD-10-CM

## 2023-09-21 DIAGNOSIS — R079 Chest pain, unspecified: Secondary | ICD-10-CM | POA: Diagnosis not present

## 2023-09-21 DIAGNOSIS — I5033 Acute on chronic diastolic (congestive) heart failure: Secondary | ICD-10-CM | POA: Diagnosis present

## 2023-09-21 DIAGNOSIS — T380X5A Adverse effect of glucocorticoids and synthetic analogues, initial encounter: Secondary | ICD-10-CM | POA: Diagnosis present

## 2023-09-21 DIAGNOSIS — Z9841 Cataract extraction status, right eye: Secondary | ICD-10-CM | POA: Diagnosis not present

## 2023-09-21 DIAGNOSIS — R739 Hyperglycemia, unspecified: Secondary | ICD-10-CM | POA: Diagnosis not present

## 2023-09-21 DIAGNOSIS — Z9842 Cataract extraction status, left eye: Secondary | ICD-10-CM | POA: Diagnosis not present

## 2023-09-21 DIAGNOSIS — R011 Cardiac murmur, unspecified: Secondary | ICD-10-CM | POA: Diagnosis present

## 2023-09-21 DIAGNOSIS — Z79890 Hormone replacement therapy (postmenopausal): Secondary | ICD-10-CM | POA: Diagnosis not present

## 2023-09-21 DIAGNOSIS — M8580 Other specified disorders of bone density and structure, unspecified site: Secondary | ICD-10-CM | POA: Diagnosis present

## 2023-09-21 DIAGNOSIS — Z951 Presence of aortocoronary bypass graft: Secondary | ICD-10-CM | POA: Diagnosis not present

## 2023-09-21 DIAGNOSIS — Z961 Presence of intraocular lens: Secondary | ICD-10-CM | POA: Diagnosis present

## 2023-09-21 DIAGNOSIS — R0902 Hypoxemia: Secondary | ICD-10-CM | POA: Diagnosis present

## 2023-09-21 DIAGNOSIS — J9811 Atelectasis: Secondary | ICD-10-CM | POA: Diagnosis present

## 2023-09-21 DIAGNOSIS — E66812 Obesity, class 2: Secondary | ICD-10-CM | POA: Diagnosis present

## 2023-09-21 DIAGNOSIS — M19041 Primary osteoarthritis, right hand: Secondary | ICD-10-CM | POA: Diagnosis present

## 2023-09-21 LAB — CBC WITH DIFFERENTIAL/PLATELET
Abs Immature Granulocytes: 0 10*3/uL (ref 0.00–0.07)
Basophils Absolute: 0 10*3/uL (ref 0.0–0.1)
Basophils Relative: 0 %
Eosinophils Absolute: 0 10*3/uL (ref 0.0–0.5)
Eosinophils Relative: 0 %
HCT: 34.9 % — ABNORMAL LOW (ref 36.0–46.0)
Hemoglobin: 12.8 g/dL (ref 12.0–15.0)
Lymphocytes Relative: 4 %
Lymphs Abs: 0.4 10*3/uL — ABNORMAL LOW (ref 0.7–4.0)
MCH: 30.6 pg (ref 26.0–34.0)
MCHC: 36.7 g/dL — ABNORMAL HIGH (ref 30.0–36.0)
MCV: 83.5 fL (ref 80.0–100.0)
Monocytes Absolute: 0 10*3/uL — ABNORMAL LOW (ref 0.1–1.0)
Monocytes Relative: 0 %
Neutro Abs: 9.4 10*3/uL — ABNORMAL HIGH (ref 1.7–7.7)
Neutrophils Relative %: 96 %
Platelets: 262 10*3/uL (ref 150–400)
RBC: 4.18 MIL/uL (ref 3.87–5.11)
RDW: 14.1 % (ref 11.5–15.5)
WBC: 9.8 10*3/uL (ref 4.0–10.5)
nRBC: 0 % (ref 0.0–0.2)
nRBC: 0 /100{WBCs}

## 2023-09-21 LAB — RESPIRATORY PANEL BY PCR

## 2023-09-21 LAB — ECHOCARDIOGRAM COMPLETE
Area-P 1/2: 3.6 cm2
Calc EF: 77.8 %
Height: 62 in
S' Lateral: 2.8 cm
Single Plane A2C EF: 77.8 %
Single Plane A4C EF: 78.6 %
Weight: 3125.24 [oz_av]

## 2023-09-21 LAB — GLUCOSE, CAPILLARY: Glucose-Capillary: 121 mg/dL — ABNORMAL HIGH (ref 70–99)

## 2023-09-21 LAB — BASIC METABOLIC PANEL WITH GFR
Anion gap: 11 (ref 5–15)
BUN: 10 mg/dL (ref 8–23)
CO2: 29 mmol/L (ref 22–32)
Calcium: 8.6 mg/dL — ABNORMAL LOW (ref 8.9–10.3)
Chloride: 97 mmol/L — ABNORMAL LOW (ref 98–111)
Creatinine, Ser: 0.94 mg/dL (ref 0.44–1.00)
GFR, Estimated: >60 mL/min (ref >60)
Glucose, Bld: 309 mg/dL — ABNORMAL HIGH (ref 70–99)
Potassium: 3.6 mmol/L (ref 3.5–5.1)
Sodium: 137 mmol/L (ref 135–145)

## 2023-09-21 LAB — PROCALCITONIN: Procalcitonin: <0.1 ng/mL

## 2023-09-21 LAB — HEMOGLOBIN A1C
Hgb A1c MFr Bld: 6 % — ABNORMAL HIGH (ref 4.8–5.6)
Mean Plasma Glucose: 125.5 mg/dL

## 2023-09-21 LAB — TSH: TSH: 0.738 u[IU]/mL (ref 0.350–4.500)

## 2023-09-21 LAB — LACTIC ACID, PLASMA: Lactic Acid, Venous: 2 mmol/L (ref 0.5–1.9)

## 2023-09-21 LAB — TROPONIN I (HIGH SENSITIVITY): Troponin I (High Sensitivity): 13 ng/L (ref <18)

## 2023-09-21 LAB — MAGNESIUM: Magnesium: 2 mg/dL (ref 1.7–2.4)

## 2023-09-21 MED ORDER — SODIUM CHLORIDE 0.9 % IV SOLN
500.0000 mg | Freq: Once | INTRAVENOUS | Status: AC
  Administered 2023-09-21: 500 mg via INTRAVENOUS
  Filled 2023-09-21: qty 5

## 2023-09-21 MED ORDER — LEVALBUTEROL HCL 0.63 MG/3ML IN NEBU
0.6300 mg | INHALATION_SOLUTION | Freq: Four times a day (QID) | RESPIRATORY_TRACT | Status: DC
  Administered 2023-09-21 – 2023-09-22 (×3): 0.63 mg via RESPIRATORY_TRACT
  Filled 2023-09-21 (×3): qty 3

## 2023-09-21 MED ORDER — SODIUM CHLORIDE 0.9 % IV SOLN
2.0000 g | INTRAVENOUS | Status: DC
  Administered 2023-09-21: 2 g via INTRAVENOUS
  Filled 2023-09-21: qty 20

## 2023-09-21 MED ORDER — ENOXAPARIN SODIUM 40 MG/0.4ML IJ SOSY
40.0000 mg | PREFILLED_SYRINGE | INTRAMUSCULAR | Status: DC
  Administered 2023-09-21 – 2023-09-22 (×2): 40 mg via SUBCUTANEOUS
  Filled 2023-09-21 (×3): qty 0.4

## 2023-09-21 MED ORDER — ACETAMINOPHEN 325 MG PO TABS
650.0000 mg | ORAL_TABLET | Freq: Four times a day (QID) | ORAL | Status: DC | PRN
  Administered 2023-09-21: 650 mg via ORAL
  Filled 2023-09-21: qty 2

## 2023-09-21 MED ORDER — SODIUM CHLORIDE 0.9 % IV SOLN
2.0000 g | Freq: Once | INTRAVENOUS | Status: AC
  Administered 2023-09-21: 2 g via INTRAVENOUS
  Filled 2023-09-21: qty 20

## 2023-09-21 MED ORDER — FUROSEMIDE 10 MG/ML IJ SOLN
40.0000 mg | INTRAMUSCULAR | Status: AC
  Administered 2023-09-21: 40 mg via INTRAVENOUS
  Filled 2023-09-21: qty 4

## 2023-09-21 MED ORDER — SODIUM CHLORIDE 0.9 % IV SOLN
500.0000 mg | INTRAVENOUS | Status: DC
  Administered 2023-09-21: 500 mg via INTRAVENOUS
  Filled 2023-09-21: qty 5

## 2023-09-21 MED ORDER — PREDNISONE 20 MG PO TABS
40.0000 mg | ORAL_TABLET | Freq: Every day | ORAL | Status: DC
  Administered 2023-09-22: 40 mg via ORAL
  Filled 2023-09-21: qty 2

## 2023-09-21 MED ORDER — INSULIN ASPART 100 UNIT/ML IJ SOLN: 0.0000 [IU] | Freq: Three times a day (TID) | INTRAMUSCULAR | Status: DC

## 2023-09-21 MED ORDER — LEVALBUTEROL HCL 0.63 MG/3ML IN NEBU: 0.6300 mg | INHALATION_SOLUTION | Freq: Four times a day (QID) | RESPIRATORY_TRACT | Status: DC | PRN

## 2023-09-21 MED ORDER — PERFLUTREN LIPID MICROSPHERE
1.0000 mL | INTRAVENOUS | Status: AC | PRN
  Administered 2023-09-21: 4 mL via INTRAVENOUS

## 2023-09-21 MED ORDER — ACETAMINOPHEN 650 MG RE SUPP: 650.0000 mg | Freq: Four times a day (QID) | RECTAL | Status: DC | PRN

## 2023-09-21 MED ORDER — GUAIFENESIN ER 600 MG PO TB12
600.0000 mg | ORAL_TABLET | Freq: Two times a day (BID) | ORAL | Status: DC
  Administered 2023-09-21 – 2023-09-23 (×5): 600 mg via ORAL
  Filled 2023-09-21 (×5): qty 1

## 2023-09-21 NOTE — Plan of Care (Addendum)
 Plan of Care Note for accepted transfer  Patient: Gloria Mccoy              ZOX:[id number]  DOA: 09/20/2023     Facility requesting transfer: [facility] emergency department Requesting Provider: Dr. Jarold Motto  Reason for transfer: Concern CHF exacerbation, chronic stable angina and new oxygen requirement in the setting of URI symptoms.  ED triage note: Sore throat, cough chills chest pain since Friday   Seen at [facility] on Friday    "Whatever they gave me is not working, my chest feel like I'm a having a heart attack"            Facility course: 78 year old female history of diastolic heart failure with preserved EF CAD s/p PCI with multiple coronary artery disease previously had been  been referred previously for CABG, chronic stable angina, hyperlipidemia, essential hypertension, presented emergency department complaining of upper respiratory symptoms and chest pain. Patient reports since Friday she has congestion, runny nose, sore throat and productive cough.  Reported she has experiencing intermittent chest pain.  And now it is getting severe.  Feels like pressure.  Denies any diaphoresis and vomiting.  Patient has subjective fever.  At presentation to ED patient O2 sat dropped to 87% room air currently on 97% on 3 L oxygen and blood pressure is borderline soft 106/58.  Blood cultures are in process. Troponin flat 14 and 13 without any delta change. Elevated D-dimer interval 1.93. Respiratory panel negative.  Group A strep PCR negative. Normal BNP 71.7. CMP showing low potassium 3.3 otherwise unremarkable. CBC showing leukocytosis 13.7 stable H&H normal platelet count.  EKG showing normal sinus rhythm heart rate 77.  CTA chest showing cardiomegaly, scattered groundglass opacity throughout the lung reflect early edema and bibasilar atelectasis.  No evidence of PE.   With the concern for pneumonia in the ED patient has been given ceftriaxone and azithromycin and DuoNeb nebulizer.   Also received methylprednisolone 125 mg.  Patient also received Lasix 40 mg IV.  Given patient has significant history of CAD, now complaining about chest pain and CTA chest showing pulmonary edema ED physician is concerned about patient probably having early development of CHF exacerbation and also having symptom of upper respiratory tract infection. Also patient has new requirement of oxygen. Last echo done in 2019 will need an another echo.  Hospitalist has been consulted for management of management of CHF exacerbation, chronic stable angina, new requirement of oxygen in the setting of upper respite tract infection.     Plan of care: The patient is accepted for admission for inpatient status to Telemetry unit, at [facility].  [facility] will assume care on arrival to accepting facility. Until arrival, care as per EDP. However, [facility] available 24/7 for questions and assistance.   Check [email or web address] for on-call coverage.   Nursing staff, please call [facility] Admits & Consults System-Wide number under Amion on patient's arrival so appropriate admitting provider can evaluate the pt.    Author: Tereasa Coop, MD  09/21/2023  [facility]

## 2023-09-21 NOTE — ED Notes (Signed)
Patient asleep. Daughter at bedside.

## 2023-09-21 NOTE — ED Provider Notes (Signed)
 Patient signed out pending discussion with hospitalist.  Presented with upper respiratory symptoms.  Likely combination of pneumonia versus CHF.  History of cardiac disease.  Troponins flat.  Leukocytosis to 13.  Patient was given antibiotics and dose of diuretic.  Plan for admission.  Stable on 3 L. Physical Exam  BP (!) 115/50   Pulse 71   Temp 98.9 F (37.2 C)   Resp 19   SpO2 97%   Procedures  Procedures  ED Course / MDM   Clinical Course as of 09/21/23 0028  Wed Sep 21, 2023  0004 Signed out to Dr Wilkie Aye [RP]    Clinical Course User Index [RP] Rondel Baton, MD   Medical Decision Making Amount and/or Complexity of Data Reviewed Labs: ordered. Radiology: ordered.  Risk OTC drugs. Prescription drug management. Decision regarding hospitalization.   Problem List Items Addressed This Visit   None Visit Diagnoses       Hypoxia    -  Primary     Pneumonia due to infectious organism, unspecified laterality, unspecified part of lung       Relevant Medications   ipratropium-albuterol (DUONEB) 0.5-2.5 (3) MG/3ML nebulizer solution 3 mL (Completed)   ipratropium-albuterol (DUONEB) 0.5-2.5 (3) MG/3ML nebulizer solution 3 mL (Completed)   cefTRIAXone (ROCEPHIN) 2 g in sodium chloride 0.9 % 100 mL IVPB   azithromycin (ZITHROMAX) 500 mg in sodium chloride 0.9 % 250 mL IVPB              Shon Baton, MD 09/21/23 0028

## 2023-09-21 NOTE — H&P (Addendum)
 History and Physical    Patient: Gloria Mccoy ZOX:[id number] DOB: [birth year] DOA: 09/20/2023 DOS: the patient was seen and examined on 09/21/2023 PCP: Elfredia Nevins, MD  Patient coming from: Home  Chief Complaint:  Chief Complaint  Patient presents with   Chest Pain   HPI: KAIAH HOSEA is a 78 y.o. female with medical history significant of diastolic heart failure with preserved EF CAD s/p PCI and CABG, chronic stable angina, hyperlipidemia, essential hypertension, and prior history of breast cancer who presents with sore throat, ear pain, and difficulty breathing. She is accompanied by her two daughters.  She has been experiencing difficulty breathing over the last 4 days, characterized by significant wheezing and coughing. She describes a sensation of chest tightness and 'squeezing in the heart.' No history of smoking or asthma, and she is not on any breathing treatments at home. She is not normally on oxygen.  She reports symptoms initially started out with a sore throat and ear pain.  These symptoms have been persistent and were severe enough that she was seen at urgent care.  Symptoms were thought secondary to allergies at that time and she was sent home with fluticasone, montelukast, and eyedrops.  Despite using these medications symptoms persisted.  She has experienced intermittent fevers and chills but did not measure her temperature at home. No vomiting.  She reports coughing up a lot of mucus, which sometimes changes color. She has been using Mucinex to manage her cough.  She denies any leg swelling or calf pain.  In the emergency department patient was noted to be afebrile with respirations elevated up to 26, blood pressures 105/48 to 129/64, and O2 saturations noted to be as low as 87% with improvement on 3 L nasal cannula oxygen.  Labs from 4/1 significant for WBC 13.3, potassium 3.3, BNP 71.7, D-dimer 1.93, and high-sensitivity troponins negative x 2.  Influenza,  COVID-19, RSV, and group A strep negative.  CTA of the chest have been obtained showing cardiomegaly with scattered groundglass opacity throughout the lung to reflect early erythema and bibasilar atelectasis without evidence of a pulmonary embolism.  She had been given Rocephin, azithromycin, DuoNeb breathing treatment, Solu-Medrol 125 mg IV, and Lasix 40 mg IV x 1 dose.  Review of Systems: As mentioned in the history of present illness. All other systems reviewed and are negative. Past Medical History:  Diagnosis Date   Arthritis    "hands sometimes" (07/13/2017)   Coronary artery disease    a. s/p CABG x3 in 06/2017 with LIMA-LAD, SVG-D1, and SVG-RI.  b. 10/2017: cath showing a widely patent LIMA-LAD with ostial occlusion of the SVG-RI and subtotally occluded atretic SVG-D1. Graft occlusion thought to be 2ry to improvement in pre-CABG stenoses.    Essential hypertension    Family history of adverse reaction to anesthesia    sister had a complication that was stated she had a "foggy" episode after her breast surgery was readmitted 1 day post op after being discharged from the hospital. Sister also has significant lung problems that could have contributed to this    Family history of breast cancer    Family history of colon cancer    Hypothyroid    Meniere disease    Myocardial infarction (HCC) 06/2017   during [facility]   Obesity    Osteopenia 01/2012   T score -1.3 FRAX 7.9%/0.6%   Personal history of chemotherapy 2021   Personal history of radiation therapy 2021   left breast   Past  Surgical History:  Procedure Laterality Date   BREAST LUMPECTOMY WITH RADIOACTIVE SEED AND SENTINEL LYMPH NODE BIOPSY Left 09/01/2018   Procedure: LEFT BREAST LUMPECTOMY WITH RADIOACTIVE SEED AND SENTINEL LYMPH NODE BIOPSY;  Surgeon: Griselda Miner, MD;  Location: [facility] OR;  Service: General;  Laterality: Left;   BREAST SURGERY Left 2020   INVASIVE DUCTAL CARCINOMA/DUCTAL CARCINOMA IN SITU/MARGINS  UNINVOLVED   CARDIAC CATHETERIZATION  07/13/2017   CATARACT EXTRACTION W/PHACO Right 01/28/2015   Procedure: CATARACT EXTRACTION PHACO AND INTRAOCULAR LENS PLACEMENT :  CDE:  5.70;  Surgeon: Jethro Bolus, MD;  Location: [facility] ORS;  Service: Ophthalmology;  Laterality: Right;   CATARACT EXTRACTION W/PHACO Left 02/11/2015   Procedure: CATARACT EXTRACTION PHACO AND INTRAOCULAR LENS PLACEMENT (IOC);  Surgeon: Jethro Bolus, MD;  Location: [facility] ORS;  Service: Ophthalmology;  Laterality: Left;  CDE: 7.38   COLONOSCOPY N/A 09/26/2012   Procedure: COLONOSCOPY;  Surgeon: Dalia Heading, MD;  Location: [facility] ENDO SUITE;  Service: Gastroenterology;  Laterality: N/A;   CORONARY ARTERY BYPASS GRAFT N/A 07/15/2017   Procedure: CORONARY ARTERY BYPASS GRAFTING (CABG) X 3 USING LEFT INTERNAL MAMMARY ARTERY AND RIGHT SAPHENOUS VEIN- ENDOSCOPICALLY HARVESTED;  Surgeon: Loreli Slot, MD;  Location: [facility] OR;  Service: Open Heart Surgery;  Laterality: N/A;   IR IMAGING GUIDED PORT INSERTION  12/29/2018   IR REMOVAL TUN ACCESS W/ PORT W/O FL MOD SED  04/26/2019   LEFT HEART CATH AND CORONARY ANGIOGRAPHY N/A 07/13/2017   Procedure: LEFT HEART CATH AND CORONARY ANGIOGRAPHY;  Surgeon: Swaziland, Peter M, MD;  Location: [facility] INVASIVE CV LAB;  Service: Cardiovascular;  Laterality: N/A;   LEFT HEART CATH AND CORS/GRAFTS ANGIOGRAPHY N/A 11/03/2017   Procedure: LEFT HEART CATH AND CORS/GRAFTS ANGIOGRAPHY;  Surgeon: Lennette Bihari, MD;  Location: [facility] INVASIVE CV LAB;  Service: Cardiovascular;  Laterality: N/A;   TEE WITHOUT CARDIOVERSION N/A 07/15/2017   Procedure: TRANSESOPHAGEAL ECHOCARDIOGRAM (TEE);  Surgeon: Loreli Slot, MD;  Location: [facility] OR;  Service: Open Heart Surgery;  Laterality: N/A;   TUBAL LIGATION     TYMPANOPLASTY Left    fluid from ear drum   Social History:  reports that she has never smoked. She quit smokeless tobacco use about 6 years ago.  Her smokeless tobacco use included snuff. She reports that she  does not currently use alcohol after a past usage of about 1.0 standard drink of alcohol per week. She reports that she does not use drugs.  Allergies  Allergen Reactions   Aloe Vera Itching    Per patient severe itching   Sulfa Antibiotics Shortness Of Breath   Sulfasalazine Shortness Of Breath    Family History  Problem Relation Age of Onset   Diabetes Sister        AODM   Heart disease Sister 2       CABG   Breast cancer Sister    Heart disease Brother 6       In his 8s   Colon cancer Maternal Uncle 23   Diabetes Sister        AODM   Hypertension Daughter    Hypertension Daughter    Heart attack Father        In his 21s   Stroke Mother    Stroke Maternal Grandmother    Diabetes Maternal Grandfather        d. 109   Heart attack Paternal Grandmother    Colon cancer Maternal Uncle 77   Breast cancer Cousin  dx 67s; d. 10s   Breast cancer Niece 58    Prior to Admission medications   Medication Sig Start Date End Date Taking? Authorizing Provider  AREXVY 120 MCG/0.5ML injection  05/24/22   [provider]  aspirin EC 81 MG tablet Take 81 mg by mouth daily.    [provider]  atorvastatin (LIPITOR) 80 MG tablet TAKE 1 TABLET BY MOUTH ONCE DAILY AT [facility] 12/09/22   Antoine Poche, MD  Cholecalciferol (VITAMIN D PO) Take 1 tablet by mouth daily.     [provider]  Cyanocobalamin (VITAMIN B-12 PO) Take 1 tablet by mouth daily.    [provider]  fish oil-omega-3 fatty acids 1000 MG capsule Take 1 g by mouth daily.    [provider]  fluticasone (FLONASE) 50 MCG/ACT nasal spray Place 2 sprays into both nostrils daily. 09/17/23   Leath-Warren, Sadie Haber, NP  furosemide (LASIX) 40 MG tablet Take 40 mg by mouth daily.    [provider]  levothyroxine (SYNTHROID) 25 MCG tablet Take 25 mcg by mouth daily. 07/15/21   [provider]  levothyroxine (SYNTHROID, LEVOTHROID) 75 MCG tablet Take 75 mcg by mouth  daily.    [provider]  losartan (COZAAR) 25 MG tablet Take 1 tablet (25 mg total) by mouth daily. 09/15/23   Antoine Poche, MD  methocarbamol (ROBAXIN) 500 MG tablet SMARTSIG:1 Tablet(s) By Mouth 1-3 Times Daily 02/17/23   [provider]  metoprolol tartrate (LOPRESSOR) 25 MG tablet TAKE 1 TABLET TWICE DAILY 12/24/22   Antoine Poche, MD  montelukast (SINGULAIR) 10 MG tablet Take 1 tablet (10 mg total) by mouth at bedtime. 09/17/23   Leath-Warren, Sadie Haber, NP  nitroGLYCERIN (NITROSTAT) 0.4 MG SL tablet Place 0.4 mg under the tongue every 5 (five) minutes x 3 doses as needed for chest pain (if no relief after 3rd dose, proceed to ED or call 911).    [provider]  olopatadine (PATADAY) 0.1 % ophthalmic solution Place 1 drop into both eyes 2 (two) times daily. 09/17/23   Leath-Warren, Sadie Haber, NP  omega-3 acid ethyl esters (LOVAZA) 1 g capsule Take by mouth. 09/07/16   [provider]  [facility] syringe  04/07/22   [provider]  vitamin E 100 UNIT capsule Take 1 capsule (100 Units total) by mouth daily. 03/15/19   [name], NP    Physical Exam: Vitals:   09/21/23 0200 09/21/23 0252 09/21/23 0300 09/21/23 0341  BP: (!) 105/51 (!) 117/46  126/66  Pulse: 68 66  70  Resp: 18 14  18   Temp:  98 F (36.7 C)  97.9 F (36.6 C)  TempSrc:  Oral    SpO2: 95% 95%  99%  Weight:   88.6 kg   Height:   5\' 2"  (1.575 m)      Constitutional: Elderly female who appears to be in no acute distress at this time able to follow commands. Eyes: PERRL, lids and conjunctivae normal ENMT: Mucous membranes are moist. Posterior pharynx clear of any exudate or lesions.  Neck: normal, supple, no masses, no thyromegaly Respiratory: clear to auscultation bilaterally, no wheezing, no crackles. Normal respiratory effort. No accessory muscle use.  Cardiovascular: Regular rate and rhythm, no murmurs / rubs / gallops. No extremity edema. No carotid  bruits.  Abdomen: no tenderness, no masses palpated.  Bowel sounds positive.  Musculoskeletal: no clubbing / cyanosis. Normal muscle tone.  Skin: no rashes, lesions, ulcers. No induration  Neurologic: CN 2-12 grossly intact. Sensation intact, DTR normal. Strength 5/5 in all 4.  Psychiatric: Normal judgment and insight. Alert and oriented x 3. Normal mood.   Data Reviewed:  EKG reveals sinus rhythm at 77 bpm with borderline left axis deviation which is improved from initial EKGs showing concern for possible inferior MI  Assessment and Plan:  Acute respiratory failure with hypoxia secondary to suspected bronchitis and/or heart failure with preserved ejection fraction Patient presents with complaints of sore throat, cough, chills, and chest pain.  On physical exam patient without significant lower extremity swelling at this time.  D-dimer was noted to be elevated at 1.93. CT angiogram of the chest had noted concern for cardiomegaly with scattered groundglass opacities throughout the lungs thought to possibly reflect early edema with bibasilar atelectasis.  BNP was within normal limits at 71.7.  Last echocardiogram noted EF to be preserved at 55 to 60% with grade 1 diastolic dysfunction back in 2019.  Patient had been given Lasix 40 mg IV, breathing treatments, and.  Unclear if acute respiratory failure with hypoxia is multifactorial in the setting of bronchitis which is possibly infectious, and/or fluid overload. -Admit to a cardiac telemetry bed -Continuous pulse oximetry with oxygen maintain O2 saturations greater than 92% -Strict intake and output and daily weights -Check TSH -Check echocardiogram -Levalbuterol nebs 4 times daily -Reassess and determine need of continued IV diuresis -Prednisone 40 mg p.o. daily -Mucinex  SIRS Patient was noted to be tachypneic with white blood cell count elevated at 13.3.  No initial lactic acid was obtained, but blood cultures had been collected.   Influenza, COVID-19, RSV, and strep screening were negative.  Patient had been placed on empiric antibiotics of Rocephin and azithromycin. -Follow-up blood cultures -Check complete respiratory virus panel -Check lactic acid and procalcitonin -Continue empiric antibiotics of Rocephin and azithromycin  Hypokalemia Acute.  Potassium noted to be 3.3. -Give 40 meq of potassium chloride p.o. -Check magnesium level  -Continue to monitor and replace as needed  Chest pain CAD Patient had reported complaints of chest pain with coughing.  Prior history of three-vessel CABG back in 2019.  Initial EKG concerning for possible inferior infarct, but less evident with repeat checks.  High-sensitivity troponins were negative x 2.   -Follow-up echocardiogram and formally consult cardiology if medically warranted  Essential hypertension Blood pressures noted to be elevated up to 135/57. -Will need to resume home blood pressure regimen as tolerated.  Hyperglycemia Acute.  Repeat labs today noted glucose elevated up to 309.  Patient had been given steroids IV.  No recent hemoglobin A1c available on file. -Check hemoglobin A1c -Hypoglycemic protocols -CBGs before every meal with very sensitive SSI  History of breast cancer Initially diagnosed back in 2020.  Patient status post left lumpectomy with chemotherapy and radiation.  Currently in remission. -Continue outpatient follow-up with oncology  Hypothyroidism -Continue levothyroxine  Hyperlipidemia -Continue atorvastatin  Obesity BMI 35.73 kg/m   DVT prophylaxis: Lovenox Advance Care Planning:   Code Status: Full Code   Consults:   Family Communication: Patient's daughters updated at bedside  Severity of Illness: The appropriate patient status for this patient is INPATIENT. Inpatient status is judged to be reasonable and necessary in order to provide the required intensity of service to ensure the patient's safety. The patient's presenting  symptoms, physical exam findings, and initial radiographic and laboratory data in the context of their chronic comorbidities is felt to place them at high risk for further clinical deterioration. Furthermore, it  is not anticipated that the patient will be medically stable for discharge from the hospital within 2 midnights of admission.   * I certify that at the point of admission it is my clinical judgment that the patient will require inpatient hospital care spanning beyond 2 midnights from the point of admission due to high intensity of service, high risk for further deterioration and high frequency of surveillance required.*  Author: Clydie Braun, MD 09/21/2023 7:25 AM  For on call review [email or web address].

## 2023-09-21 NOTE — TOC CM/SW Note (Signed)
 Transition of Care [facility]) - Inpatient Brief Assessment   Patient Details  Name: Gloria Mccoy MRN: [id number] Date of Birth: 1946/01/31  Transition of Care [facility]) CM/SW Contact:    Leone Haven, RN Phone Number: 09/21/2023, 4:18 PM   Clinical Narrative: From home with spouse, has PCP and insurance on file, states has no HH services in place at this time or DME at home.  States family member will transport them home at [employer] and family is support system, states gets medications from [facility] in [place].  Pta self ambulatory.    Transition of Care Asessment: Insurance and Status: Insurance coverage has been reviewed Patient has primary care physician: Yes Home environment has been reviewed: lives with spouse and children Prior level of function:: indep Prior/Current Home Services: No current home services Social Drivers of Health Review: SDOH reviewed no interventions necessary Readmission risk has been reviewed: Yes Transition of care needs: no transition of care needs at this time

## 2023-09-21 NOTE — Plan of Care (Signed)
   Problem: Health Behavior/Discharge Planning: Goal: Ability to manage health-related needs will improve Outcome: Progressing   Problem: Clinical Measurements: Goal: Ability to maintain clinical measurements within normal limits will improve Outcome: Progressing

## 2023-09-21 NOTE — Progress Notes (Addendum)
 Physical Therapy Evaluation Patient Details Name: Gloria Mccoy MRN: [id number] DOB: 1946-05-12 Today's Date: 09/21/2023  History of Present Illness  78 y.o. female presents to [facility]20/23 from [facility] with chest pain and upper respiratory symptoms. Admitted with acute respiratory failure w/ hypoxia 2/2 bronchitis and/or HFpEF. CTA chest showed early edema and bibasilar atelectasis. PMHx: CABGx3, HTN, meniere's disease, MI, osteopenia, CAD   Clinical Impression  Pt seated in chair upon arrival and agreeable to PT eval. PTA, pt was independent for mobility for no AD and required assist to don/doff tops due to L shoulder pain. In today's session, pt was able to stand and ambulate with CGA for safety and no AD. Pt scored a 19/24 on the DGI, indicating that the pt is at an increased risk of falling. Pt would benefit from OP PT to work on balance deficits and safety with mobility. Pt has 24/7 physical assist available upon d/c from the hospital. Pt would benefit from acute skilled PT to address functional impairments. Acute PT to follow.  SpO2 >93% on 4L        If plan is discharge home, recommend the following: A little help with walking and/or transfers;A little help with bathing/dressing/bathroom;Assist for transportation;Help with stairs or ramp for entrance   Can travel by private vehicle    Yes    Equipment Recommendations None recommended by PT  Recommendations for Other Services  OT consult    Functional Status Assessment Patient has had a recent decline in their functional status and demonstrates the ability to make significant improvements in function in a reasonable and predictable amount of time.     Precautions / Restrictions Precautions Precautions: Fall Precaution/Restrictions Comments: watch O2 Restrictions Weight Bearing Restrictions Per Provider Order: No      Mobility  Bed Mobility    General bed mobility comments: seated in chair upon arrival     Transfers Overall transfer level: Needs assistance Equipment used: None Transfers: Sit to/from Stand Sit to Stand: Contact guard assist    General transfer comment: CGA for safety w/ no AD    Ambulation/Gait Ambulation/Gait assistance: Contact guard assist Gait Distance (Feet): 250 Feet Assistive device: None Gait Pattern/deviations: Step-through pattern, Shuffle Gait velocity: decr     General Gait Details: Steady gait without challenges to balance. Decreased gait speed and slight unsteadiness with DGI testing     Balance    Standardized Balance Assessment Standardized Balance Assessment : Dynamic Gait Index   Dynamic Gait Index Level Surface: Normal Change in Gait Speed: Mild Impairment Gait with Horizontal Head Turns: Normal Gait with Vertical Head Turns: Mild Impairment Gait and Pivot Turn: Normal Step Over Obstacle: Mild Impairment Step Around Obstacles: Mild Impairment Steps: Mild Impairment Total Score: 19       Pertinent Vitals/Pain Pain Assessment Pain Assessment: No/denies pain    Home Living Family/patient expects to be discharged to:: Private residence Living Arrangements: Children;Spouse/significant other Available Help at Discharge: Family;Available 24 hours/day Type of Home: House Home Access: Stairs to enter Entrance Stairs-Rails: Left Entrance Stairs-Number of Steps: 4   Home Layout: One level Home Equipment: Shower seat;Cane - single point;Cane - quad;Rolling Walker (2 wheels);BSC/3in1;Grab bars - toilet;Hand held shower head;Grab bars - tub/shower      Prior Function Prior Level of Function : Needs assist;Driving    Mobility Comments: Ind no AD ADLs Comments: assist to put on top due to pain in L shoulder     Extremity/Trunk Assessment   Upper Extremity Assessment Upper Extremity  Assessment: Defer to OT evaluation    Lower Extremity Assessment Lower Extremity Assessment: Overall WFL for tasks assessed    Cervical / Trunk  Assessment Cervical / Trunk Assessment: Normal  Communication   Communication Communication: Impaired Factors Affecting Communication: Hearing impaired (less hearing in L ear)    Cognition Arousal: Alert Behavior During Therapy: WFL for tasks assessed/performed   PT - Cognitive impairments: No apparent impairments    Following commands: Intact       Cueing Cueing Techniques: Verbal cues, Tactile cues         PT Assessment Patient needs continued PT services  PT Problem List Decreased activity tolerance;Decreased balance;Decreased mobility;Cardiopulmonary status limiting activity       PT Treatment Interventions DME instruction;Gait training;Functional mobility training;Stair training;Therapeutic activities;Therapeutic exercise;Balance training;Neuromuscular re-education;Patient/family education    PT Goals (Current goals can be found in the Care Plan section)  Acute Rehab PT Goals Patient Stated Goal: to work on balance PT Goal Formulation: With patient/family Time For Goal Achievement: 10/05/23 Potential to Achieve Goals: Good Additional Goals Additional Goal #1: Pt will improve DGI score from 19/24 to 22/24 to reduce falls risk    Frequency Min 1X/week        AM-PAC PT "6 Clicks" Mobility  Outcome Measure Help needed turning from your back to your side while in a flat bed without using bedrails?: None Help needed moving from lying on your back to sitting on the side of a flat bed without using bedrails?: A Little Help needed moving to and from a bed to a chair (including a wheelchair)?: A Little Help needed standing up from a chair using your arms (e.g., wheelchair or bedside chair)?: A Little Help needed to walk in hospital room?: A Little Help needed climbing 3-5 steps with a railing? : A Little 6 Click Score: 19    End of Session Equipment Utilized During Treatment: Gait belt Activity Tolerance: Patient tolerated treatment well Patient left: in bed;with  call bell/phone within reach;with family/visitor present Nurse Communication: Mobility status (O2 sats) PT Visit Diagnosis: Other abnormalities of gait and mobility (R26.89);Unsteadiness on feet (R26.81)    Time: 0981-1914 PT Time Calculation (min) (ACUTE ONLY): 21 min   Charges:   PT Evaluation $PT Eval Low Complexity: 1 Low   PT General Charges $$ ACUTE PT VISIT: 1 Visit       Hilton Cork, PT, DPT Secure Chat Preferred  Rehab Office [phone]   Arturo Morton Brion Aliment 09/21/2023, 5:02 PM

## 2023-09-22 DIAGNOSIS — R7303 Prediabetes: Secondary | ICD-10-CM

## 2023-09-22 DIAGNOSIS — I5033 Acute on chronic diastolic (congestive) heart failure: Secondary | ICD-10-CM | POA: Diagnosis not present

## 2023-09-22 DIAGNOSIS — E876 Hypokalemia: Secondary | ICD-10-CM | POA: Diagnosis not present

## 2023-09-22 DIAGNOSIS — I10 Essential (primary) hypertension: Secondary | ICD-10-CM | POA: Diagnosis not present

## 2023-09-22 DIAGNOSIS — I2510 Atherosclerotic heart disease of native coronary artery without angina pectoris: Secondary | ICD-10-CM | POA: Diagnosis not present

## 2023-09-22 LAB — BASIC METABOLIC PANEL WITH GFR
Anion gap: 11 (ref 5–15)
BUN: 16 mg/dL (ref 8–23)
CO2: 28 mmol/L (ref 22–32)
Calcium: 8.5 mg/dL — ABNORMAL LOW (ref 8.9–10.3)
Chloride: 101 mmol/L (ref 98–111)
Creatinine, Ser: 0.92 mg/dL (ref 0.44–1.00)
GFR, Estimated: >60 mL/min (ref >60)
Glucose, Bld: 124 mg/dL — ABNORMAL HIGH (ref 70–99)
Potassium: 3.6 mmol/L (ref 3.5–5.1)
Sodium: 140 mmol/L (ref 135–145)

## 2023-09-22 LAB — CBC
HCT: 32 % — ABNORMAL LOW (ref 36.0–46.0)
Hemoglobin: 11.6 g/dL — ABNORMAL LOW (ref 12.0–15.0)
MCH: 30.1 pg (ref 26.0–34.0)
MCHC: 36.3 g/dL — ABNORMAL HIGH (ref 30.0–36.0)
MCV: 83.1 fL (ref 80.0–100.0)
Platelets: 259 10*3/uL (ref 150–400)
RBC: 3.85 MIL/uL — ABNORMAL LOW (ref 3.87–5.11)
RDW: 14.2 % (ref 11.5–15.5)
WBC: 22.3 10*3/uL — ABNORMAL HIGH (ref 4.0–10.5)
nRBC: 0 % (ref 0.0–0.2)

## 2023-09-22 LAB — GLUCOSE, CAPILLARY
Glucose-Capillary: 114 mg/dL — ABNORMAL HIGH (ref 70–99)
Glucose-Capillary: 154 mg/dL — ABNORMAL HIGH (ref 70–99)
Glucose-Capillary: 133 mg/dL — ABNORMAL HIGH (ref 70–99)

## 2023-09-22 MED ORDER — POTASSIUM CHLORIDE CRYS ER 20 MEQ PO TBCR
40.0000 meq | EXTENDED_RELEASE_TABLET | ORAL | Status: AC
  Administered 2023-09-22 (×2): 40 meq via ORAL
  Filled 2023-09-22 (×2): qty 2

## 2023-09-22 MED ORDER — LEVALBUTEROL HCL 0.63 MG/3ML IN NEBU
0.6300 mg | INHALATION_SOLUTION | Freq: Two times a day (BID) | RESPIRATORY_TRACT | Status: DC
  Administered 2023-09-22 (×2): 0.63 mg via RESPIRATORY_TRACT
  Filled 2023-09-22 (×3): qty 3

## 2023-09-22 MED ORDER — LEVOTHYROXINE SODIUM 100 MCG PO TABS
100.0000 ug | ORAL_TABLET | Freq: Every day | ORAL | Status: DC
  Administered 2023-09-22 – 2023-09-23 (×2): 100 ug via ORAL
  Filled 2023-09-22 (×2): qty 1

## 2023-09-22 MED ORDER — MONTELUKAST SODIUM 10 MG PO TABS
10.0000 mg | ORAL_TABLET | Freq: Every day | ORAL | Status: DC
  Administered 2023-09-22: 10 mg via ORAL
  Filled 2023-09-22: qty 1

## 2023-09-22 MED ORDER — METOPROLOL TARTRATE 25 MG PO TABS
25.0000 mg | ORAL_TABLET | Freq: Two times a day (BID) | ORAL | Status: DC
  Administered 2023-09-22 – 2023-09-23 (×3): 25 mg via ORAL
  Filled 2023-09-22 (×3): qty 1

## 2023-09-22 MED ORDER — FUROSEMIDE 10 MG/ML IJ SOLN: 40.0000 mg | Freq: Two times a day (BID) | INTRAMUSCULAR | Status: DC

## 2023-09-22 MED ORDER — FUROSEMIDE 10 MG/ML IJ SOLN
60.0000 mg | Freq: Two times a day (BID) | INTRAMUSCULAR | Status: DC
  Administered 2023-09-22 – 2023-09-23 (×2): 60 mg via INTRAVENOUS
  Filled 2023-09-22 (×2): qty 6

## 2023-09-22 MED ORDER — ASPIRIN 81 MG PO TBEC
81.0000 mg | DELAYED_RELEASE_TABLET | Freq: Every day | ORAL | Status: DC
  Administered 2023-09-22 – 2023-09-23 (×2): 81 mg via ORAL
  Filled 2023-09-22 (×2): qty 1

## 2023-09-22 MED ORDER — OLOPATADINE HCL 0.1 % OP SOLN
1.0000 [drp] | Freq: Two times a day (BID) | OPHTHALMIC | Status: DC
  Administered 2023-09-22 – 2023-09-23 (×3): 1 [drp] via OPHTHALMIC
  Filled 2023-09-22: qty 5

## 2023-09-22 MED ORDER — METHOCARBAMOL 500 MG PO TABS
500.0000 mg | ORAL_TABLET | Freq: Two times a day (BID) | ORAL | Status: DC
  Administered 2023-09-22 – 2023-09-23 (×3): 500 mg via ORAL
  Filled 2023-09-22 (×3): qty 1

## 2023-09-22 MED ORDER — INSULIN ASPART 100 UNIT/ML IJ SOLN
0.0000 [IU] | Freq: Three times a day (TID) | INTRAMUSCULAR | Status: DC
  Administered 2023-09-22: 2 [IU] via SUBCUTANEOUS

## 2023-09-22 MED ORDER — FLUTICASONE PROPIONATE 50 MCG/ACT NA SUSP
2.0000 | Freq: Every day | NASAL | Status: DC
  Administered 2023-09-22 – 2023-09-23 (×2): 2 via NASAL
  Filled 2023-09-22: qty 16

## 2023-09-22 MED ORDER — NITROGLYCERIN 0.4 MG SL SUBL: 0.4000 mg | SUBLINGUAL_TABLET | SUBLINGUAL | Status: DC | PRN

## 2023-09-22 MED ORDER — ATORVASTATIN CALCIUM 80 MG PO TABS
80.0000 mg | ORAL_TABLET | Freq: Every day | ORAL | Status: DC
  Administered 2023-09-22 – 2023-09-23 (×2): 80 mg via ORAL
  Filled 2023-09-22 (×2): qty 1

## 2023-09-22 MED ORDER — LEVOTHYROXINE SODIUM 75 MCG PO TABS: 75.0000 ug | ORAL_TABLET | Freq: Every day | ORAL | Status: DC

## 2023-09-22 MED ORDER — LOSARTAN POTASSIUM 25 MG PO TABS
25.0000 mg | ORAL_TABLET | Freq: Every day | ORAL | Status: DC
  Administered 2023-09-22 – 2023-09-23 (×2): 25 mg via ORAL
  Filled 2023-09-22 (×2): qty 1

## 2023-09-22 MED ORDER — LEVOTHYROXINE SODIUM 25 MCG PO TABS: 25.0000 ug | ORAL_TABLET | Freq: Every day | ORAL | Status: DC

## 2023-09-22 NOTE — Progress Notes (Signed)
 Progress Note   Patient: Gloria Mccoy:[id number] DOB: 15-Aug-1945 DOA: 09/20/2023     1 DOS: the patient was seen and examined on 09/22/2023   Brief hospital course: Mrs. Gloria Mccoy was admitted to the hospital with the working diagnosis of heart failure exacerbation.   78 yo female with the past medical history of heart failure, coronary artery disease sp CABG, hyperlipidemia, hypertension, and history of breast cancer who presented with dyspnea. Reported worsening dyspnea for the last 4 days, with wheezing and coughing. Positive chest tightness. She was treated for allergies as outpatient with no improvement in her symptoms.  On her initial physical examination her blood pressure was 105/51, HR 68, RR 14 and 02 saturation 95%.  Lungs with no wheezing or rhonchi, heart with S1 and S2 present and regular with no gallops or rubs, abdomen with no distention, and no lower extremity edema,   Na 137, K 3.6 Cl 97, bicarbonate 29, glucose 309, bun 10 cr 0,94  Mg 2,0  Lactic acid 2,0  Procalcitonin < 0.10  Wbc 9,8 hgb 12.8 plt 262  Viral respiratory panel negative  Sars covid 19 negative Influenza and RSV negative   Chest radiograph with cardiomegaly with bilateral hilar vascular congestion, no infiltrates, sternotomy wires in place.  CT chest with bilateral ground glass opacities. Positive cardiomegaly. No evidence of pulmonary embolism.   EKG 79 bpm, left axis deviation, qtc 465, sinus rhythm with q wave lead II, III, avF with no significant ST segment or T wave changes.    Assessment and Plan: * Acute on chronic diastolic CHF (congestive heart failure) (HCC) Echocardiogram with preserved LV systolic function with EF 70 to 75%, moderate asymmetric left ventricular hypertrophy of the basal segment, RV not well visualized, trivial pericardial effusion, trivial MR and TR.   Patient continue volume overloaded.   Plan to start patient on diuretic therapy with furosemide 60 mg IV bid.  Patient  at home on 40 mg po furosemide.  Resume losartan and metoprolol.   Acute hypoxemic respiratory failure due to acute cardiogenic pulmonary edema.  Plan to continue diuresis with furosemide Oxymetry monitoring and supplemental 02 per [place] to keep -02 saturation 92% or greater.  Pneumonia ruled out, will discontinue antibiotic therapy.   Hypokalemia Renal function with serum cr at 0,92 with K at 3,6 and serum bicarbonate at 28  Na 140   Plan to add 40 meq Kcl x2 to prevent hypokalemia.  Patient will be started on furosemide for diuresis.  Follow up renal function and electrolytes.   CAD S/P percutaneous coronary angioplasty No chest pain, no signs of acute coronary syndrome.   Essential hypertension Continue blood pressure monitoring Continue with losartan, metoprolol.   Borderline diabetes mellitus Hgb A1c 6 Reactive hyperglycemia due to steroids.  Hold on steroids  Add insulin sliding scale for glucose cover and monitoring.   Hypothyroid Continue levothyroxine   Hyperlipidemia Continue statin therapy   Obesity (BMI 30-39.9) Calculated BMI is 36.6 consistent with obesity class 2         Subjective: Patient continue to have dyspnea and chest tightness, continue to have lower extremity edema   Physical Exam: Vitals:   09/22/23 0206 09/22/23 0440 09/22/23 0441 09/22/23 0442  BP:   (!) 125/58   Pulse:  (!) 58 60 (!) 58  Resp:   18   Temp:   98 F (36.7 C)   TempSrc:   Oral   SpO2: 97% 100% 100% 99%  Weight:   90.9 kg  Height:       Neurology awake and alert, deconditioned ENT with mild pallor with no icterus Cardiovascular with S1 and S2 present and regular with no gallops or rubs, positive systolic murmur at the right lower sternal border No JVD (wide nech, seating 90 degrees)  Positive lower extremity edema ++ pitting bilaterally,  Lungs with bilateral rales with no wheezing, positive scattered rhonchi Abdomen with no distention  Data  Reviewed:    Family Communication: I spoke with patient's daughters at the bedside, we talked in detail about patient's condition, plan of care and prognosis and all questions were addressed.   Disposition: Status is: Inpatient Remains inpatient appropriate because: IV diuresis   Planned Discharge Destination: Home      Author: Coralie Keens, MD 09/22/2023 7:52 AM  For on call review [email or web address].

## 2023-09-22 NOTE — Assessment & Plan Note (Signed)
 Calculated BMI is 36.6 consistent with obesity class 2

## 2023-09-22 NOTE — Plan of Care (Signed)
   Problem: Education: Goal: Knowledge of General Education information will improve Description Including pain rating scale, medication(s)/side effects and non-pharmacologic comfort measures Outcome: Progressing   Problem: Health Behavior/Discharge Planning: Goal: Ability to manage health-related needs will improve Outcome: Progressing

## 2023-09-22 NOTE — Assessment & Plan Note (Signed)
 Continue levothyroxine

## 2023-09-22 NOTE — Assessment & Plan Note (Addendum)
 Echocardiogram with preserved LV systolic function with EF 70 to 75%, moderate asymmetric left ventricular hypertrophy of the basal segment, RV not well visualized, trivial pericardial effusion, trivial MR and TR.   Patient was placed on IV  furosemide for diuresis, negative fluid balance was achieved, - 4,331 ml with significant improvement in her volume status.   Plan to continue furosemide 40 mg daily and will add SGLT 2 inh.  Continue losartan and metoprolol.   Acute hypoxemic respiratory failure due to acute cardiogenic pulmonary edema.  Oxygenation has improved, she as able to ambulate on room air with no desaturation.  Respiratory failure resolved.

## 2023-09-22 NOTE — Progress Notes (Signed)
 Physical Therapy Treatment Patient Details Name: Gloria Mccoy MRN: [id number] DOB: 19-Aug-1945 Today's Date: 09/22/2023   History of Present Illness 78 y.o. female presents to [facility] with chest pain and upper respiratory symptoms. Admitted with acute respiratory failure w/ hypoxia 2/2 bronchitis and/or HFpEF. CTA chest showed early edema and bibasilar atelectasis. PMHx: CABGx3, HTN, meniere's disease, MI, osteopenia, CAD    PT Comments  Pt in chair upon arrival with family present and agreeable to PT session. Worked on dynamic balance, gait training, and stair negotiation in today's session. Pt was able to stand and ambulate ~200 ft with no AD and CGA. Pt had one LOB when turning quickly that was recovered utilizing a stepping strategy and CGA. Pt was able to ascend/descend 10 steps with 1HR and CGA. Pt is progressing well towards goals. Acute PT to follow.   94% SpO2 on RA at rest Desatted to 82% with gait, return to 92% with PLB and standing rest breaks 96% on RA at end of session    If plan is discharge home, recommend the following: A little help with walking and/or transfers;A little help with bathing/dressing/bathroom;Assist for transportation;Help with stairs or ramp for entrance   Can travel by private vehicle      Yes  Equipment Recommendations  None recommended by PT       Precautions / Restrictions Precautions Precautions: Fall Precaution/Restrictions Comments: watch O2 Restrictions Weight Bearing Restrictions Per Provider Order: No     Mobility  Bed Mobility  General bed mobility comments: seated in chair upon arrival    Transfers Overall transfer level: Needs assistance Equipment used: None Transfers: Sit to/from Stand Sit to Stand: Supervision  General transfer comment: supervision for safety    Ambulation/Gait Ambulation/Gait assistance: Supervision, Contact guard assist Gait Distance (Feet): 200 Feet Assistive device: None Gait  Pattern/deviations: Step-through pattern, Shuffle      General Gait Details: short and steady steps with no AD, x1 LOB when turning quickly in stairway. Recovered with CGA and stepping strategy   Stairs Stairs: Yes Stairs assistance: Contact guard assist Stair Management: One rail Left, Alternating pattern, Step to pattern, Forwards, Sideways Number of Stairs: 10 General stair comments: steady w/ use of 1HR, step through pattern to ascend with sideways step to pattern to descend         Balance Overall balance assessment: Mild deficits observed, not formally tested       Communication Communication Communication: No apparent difficulties Factors Affecting Communication: Hearing impaired  Cognition Arousal: Alert Behavior During Therapy: WFL for tasks assessed/performed   PT - Cognitive impairments: No apparent impairments      Following commands: Intact      Cueing Cueing Techniques: Verbal cues  Exercises Other Exercises Other Exercises: standing side-steps in mini-squat, x10 ft B Other Exercises: backwards walking x10 ft Other Exercises: IS, x10 reps      PT Goals (current goals can now be found in the care plan section) Acute Rehab PT Goals PT Goal Formulation: With patient/family Time For Goal Achievement: 10/05/23 Potential to Achieve Goals: Good Progress towards PT goals: Progressing toward goals    Frequency    Min 1X/week       AM-PAC PT "6 Clicks" Mobility   Outcome Measure  Help needed turning from your back to your side while in a flat bed without using bedrails?: None Help needed moving from lying on your back to sitting on the side of a flat bed without using bedrails?: A  Little Help needed moving to and from a bed to a chair (including a wheelchair)?: A Little Help needed standing up from a chair using your arms (e.g., wheelchair or bedside chair)?: A Little Help needed to walk in hospital room?: A Little Help needed climbing 3-5 steps  with a railing? : A Little 6 Click Score: 19    End of Session   Activity Tolerance: Patient tolerated treatment well Patient left: in chair;with call bell/phone within reach;with family/visitor present Nurse Communication: Mobility status;Other (comment) (O2 sats) PT Visit Diagnosis: Other abnormalities of gait and mobility (R26.89);Unsteadiness on feet (R26.81)     Time: 4098-1191 PT Time Calculation (min) (ACUTE ONLY): 28 min  Charges:    $Gait Training: 8-22 mins $Therapeutic Activity: 8-22 mins PT General Charges $$ ACUTE PT VISIT: 1 Visit                     Hilton Cork, PT, DPT Secure Chat Preferred  Rehab Office [phone]    Arturo Morton Brion Aliment 09/22/2023, 5:57 PM

## 2023-09-22 NOTE — Assessment & Plan Note (Signed)
 No chest pain, no signs of acute coronary syndrome.   Dyslipidemia, continue with statin therapy.

## 2023-09-22 NOTE — Assessment & Plan Note (Signed)
 Hgb A1c 6 Reactive hyperglycemia due to steroids.  Hold on steroids  Add insulin sliding scale for glucose cover and monitoring.

## 2023-09-22 NOTE — Progress Notes (Signed)
 Mobility Specialist Progress Note:    09/22/23 1429  Mobility  Activity Ambulated with assistance in hallway  Level of Assistance Standby assist, set-up cues, supervision of patient - no hands on  Assistive Device None  Distance Ambulated (ft) 400 ft  Activity Response Tolerated well  Mobility Referral Yes  Mobility visit 1 Mobility  Mobility Specialist Start Time (ACUTE ONLY) 1132  Mobility Specialist Stop Time (ACUTE ONLY) 1147  Mobility Specialist Time Calculation (min) (ACUTE ONLY) 15 min   Received pt in chair having no complaints and agreeable to mobility. Pt was asymptomatic throughout ambulation and returned to room w/o fault. Left in chair w/ call bell in reach and all needs met. Daughter in room.   Thompson Grayer Mobility Specialist  Please contact vis Secure Chat or  Rehab Office [phone]

## 2023-09-22 NOTE — Assessment & Plan Note (Signed)
 Continue statin therapy.

## 2023-09-22 NOTE — Assessment & Plan Note (Addendum)
 Continue with losartan, metoprolol.

## 2023-09-22 NOTE — Hospital Course (Addendum)
 Mrs. Lezama was admitted to the hospital with the working diagnosis of heart failure exacerbation.   78 yo female with the past medical history of heart failure, coronary artery disease sp CABG, hyperlipidemia, hypertension, and history of breast cancer who presented with dyspnea. Reported worsening dyspnea for the last 4 days, with wheezing and coughing. Positive chest tightness. She was treated for allergies as outpatient with no improvement in her symptoms.  On her initial physical examination her blood pressure was 105/51, HR 68, RR 14 and 02 saturation 95%.  Lungs with no wheezing or rhonchi, heart with S1 and S2 present and regular with no gallops or rubs, abdomen with no distention, and no lower extremity edema,   Na 137, K 3.6 Cl 97, bicarbonate 29, glucose 309, bun 10 cr 0,94  Mg 2,0  Lactic acid 2,0  Procalcitonin < 0.10  Wbc 9,8 hgb 12.8 plt 262  Viral respiratory panel negative  Sars covid 19 negative Influenza and RSV negative   Chest radiograph with cardiomegaly with bilateral hilar vascular congestion, no infiltrates, sternotomy wires in place.  CT chest with bilateral ground glass opacities. Positive cardiomegaly. No evidence of pulmonary embolism.   EKG 79 bpm, left axis deviation, qtc 465, sinus rhythm with q wave lead II, III, avF with no significant ST segment or T wave changes.   04/04 patient was placed on furosemide with good toleration.  Patient will continue furosemide and added SGLT 2 inh.

## 2023-09-22 NOTE — Evaluation (Signed)
 Occupational Therapy Evaluation Patient Details Name: Gloria Mccoy MRN: [id number] DOB: Mar 21, 1946 Today's Date: 09/22/2023   History of Present Illness   78 y.o. female presents [facility] with chest pain and upper respiratory symptoms. Admitted with acute respiratory failure w/ hypoxia 2/2 bronchitis and/or HFpEF. CTA chest showed early edema and bibasilar atelectasis. PMHx: CABGx3, HTN, meniere's disease, MI, osteopenia, CAD     Clinical Impressions Patient admitted for the diagnosis above.  PTA she lives at home with her spouse and daughter.  Patient is close to baseline, deficit is O2 need, as she continues to desaturate on RA.  Patient high 90's with 3L, but desaturated to 84% on RA.  OT to continue efforts in the acute setting and no post acute OT is anticipated.       If plan is discharge home, recommend the following:   Assist for transportation     Functional Status Assessment   Patient has had a recent decline in their functional status and demonstrates the ability to make significant improvements in function in a reasonable and predictable amount of time.     Equipment Recommendations   None recommended by OT     Recommendations for Other Services         Precautions/Restrictions   Precautions Precautions: Fall Precaution/Restrictions Comments: watch O2 Restrictions Weight Bearing Restrictions Per Provider Order: No     Mobility Bed Mobility               General bed mobility comments: seated in chair upon arrival Patient Response: Cooperative  Transfers     Transfers: Sit to/from Stand Sit to Stand: Supervision                  Balance Overall balance assessment: No apparent balance deficits (not formally assessed)                                         ADL either performed or assessed with clinical judgement   ADL       Grooming: Supervision/safety               Lower  Body Dressing: Supervision/safety   Toilet Transfer: Supervision/safety                   Vision Patient Visual Report: No change from baseline       Perception Perception: Not tested       Praxis Praxis: Not tested       Pertinent Vitals/Pain       Extremity/Trunk Assessment Upper Extremity Assessment Upper Extremity Assessment: Overall WFL for tasks assessed;LUE deficits/detail LUE: Shoulder pain with ROM   Lower Extremity Assessment Lower Extremity Assessment: Defer to PT evaluation   Cervical / Trunk Assessment Cervical / Trunk Assessment: Normal   Communication Communication Communication: No apparent difficulties Factors Affecting Communication: Hearing impaired   Cognition Arousal: Alert Behavior During Therapy: WFL for tasks assessed/performed Cognition: No apparent impairments                               Following commands: Intact       Cueing  General Comments   Cueing Techniques: Verbal cues      Exercises     Shoulder Instructions      Home Living Family/patient expects to be discharged  to:: Private residence Living Arrangements: Children;Spouse/significant other Available Help at Discharge: Family;Available 24 hours/day Type of Home: House Home Access: Stairs to enter [employer] of Steps: 4 Entrance Stairs-Rails: Left Home Layout: One level     Bathroom Shower/Tub: Chief Strategy Officer: Standard Bathroom Accessibility: Yes   Home Equipment: Shower seat;Cane - single point;Cane - quad;Rolling Walker (2 wheels);BSC/3in1;Grab bars - toilet;Hand held shower head;Grab bars - tub/shower          Prior Functioning/Environment Prior Level of Function : Needs assist;Driving               ADLs Comments: Ind with ADL at baseline    OT Problem List: Decreased activity tolerance   OT Treatment/Interventions: Self-care/ADL training;Therapeutic activities;Balance training;DME and/or  AE instruction;Energy conservation      OT Goals(Current goals can be found in the care plan section)   Acute Rehab OT Goals Patient Stated Goal: Return home OT Goal Formulation: With patient Time For Goal Achievement: 10/06/23 Potential to Achieve Goals: Good ADL Goals Pt Will Perform Grooming: with modified independence;standing Pt Will Perform Lower Body Dressing: with modified independence;sit to/from stand Pt Will Transfer to Toilet: with modified independence;regular height toilet;ambulating   OT Frequency:  Min 2X/week    Co-evaluation              AM-PAC OT "6 Clicks" Daily Activity     Outcome Measure Help from another person eating meals?: None Help from another person taking care of personal grooming?: None Help from another person toileting, which includes using toliet, bedpan, or urinal?: A Little Help from another person bathing (including washing, rinsing, drying)?: A Little Help from another person to put on and taking off regular upper body clothing?: None Help from another person to put on and taking off regular lower body clothing?: A Little 6 Click Score: 21   End of Session Equipment Utilized During Treatment: Gait belt Nurse Communication: Mobility status  Activity Tolerance: Patient tolerated treatment well Patient left: in chair;with call bell/phone within reach;with family/visitor present  OT Visit Diagnosis: Unsteadiness on feet (R26.81)                Time: 1040-1100 OT Time Calculation (min): 20 min Charges:  OT General Charges $OT Visit: 1 Visit OT Evaluation $OT Eval Moderate Complexity: 1 Mod  09/22/2023  RP, OTR/L  Acute Rehabilitation Services  Office:  [phone]   Suzanna Obey 09/22/2023, 11:07 AM

## 2023-09-22 NOTE — Assessment & Plan Note (Signed)
 Renal function with serum cr at 0,92 with K at 3,6 and serum bicarbonate at 28  Na 140   Plan to add 40 meq Kcl x2 to prevent hypokalemia.  Patient will be started on furosemide for diuresis.  Follow up renal function and electrolytes.

## 2023-09-23 ENCOUNTER — Other Ambulatory Visit (HOSPITAL_COMMUNITY): Payer: Self-pay

## 2023-09-23 DIAGNOSIS — E876 Hypokalemia: Secondary | ICD-10-CM | POA: Diagnosis not present

## 2023-09-23 DIAGNOSIS — I5033 Acute on chronic diastolic (congestive) heart failure: Secondary | ICD-10-CM | POA: Diagnosis not present

## 2023-09-23 DIAGNOSIS — I10 Essential (primary) hypertension: Secondary | ICD-10-CM | POA: Diagnosis not present

## 2023-09-23 DIAGNOSIS — I2510 Atherosclerotic heart disease of native coronary artery without angina pectoris: Secondary | ICD-10-CM | POA: Diagnosis not present

## 2023-09-23 LAB — MAGNESIUM: Magnesium: 2.2 mg/dL (ref 1.7–2.4)

## 2023-09-23 LAB — BASIC METABOLIC PANEL WITH GFR
Anion gap: 12 (ref 5–15)
BUN: 16 mg/dL (ref 8–23)
CO2: 30 mmol/L (ref 22–32)
Calcium: 9.1 mg/dL (ref 8.9–10.3)
Chloride: 97 mmol/L — ABNORMAL LOW (ref 98–111)
Creatinine, Ser: 0.84 mg/dL (ref 0.44–1.00)
GFR, Estimated: >60 mL/min (ref >60)
Glucose, Bld: 134 mg/dL — ABNORMAL HIGH (ref 70–99)
Potassium: 4.2 mmol/L (ref 3.5–5.1)
Sodium: 139 mmol/L (ref 135–145)

## 2023-09-23 LAB — GLUCOSE, CAPILLARY
Glucose-Capillary: 115 mg/dL — ABNORMAL HIGH (ref 70–99)
Glucose-Capillary: 88 mg/dL (ref 70–99)

## 2023-09-23 MED ORDER — FUROSEMIDE 40 MG PO TABS: 40.0000 mg | ORAL_TABLET | Freq: Every day | ORAL | Status: DC

## 2023-09-23 MED ORDER — POTASSIUM CHLORIDE CRYS ER 20 MEQ PO TBCR
20.0000 meq | EXTENDED_RELEASE_TABLET | Freq: Every day | ORAL | 0 refills | Status: DC
Start: 2023-09-24 — End: 2023-11-09
  Filled 2023-09-23: qty 30, 30d supply, fill #0

## 2023-09-23 MED ORDER — EMPAGLIFLOZIN 10 MG PO TABS: 10.0000 mg | ORAL_TABLET | Freq: Every day | ORAL | Status: DC

## 2023-09-23 MED ORDER — FUROSEMIDE 20 MG PO TABS
40.0000 mg | ORAL_TABLET | Freq: Every day | ORAL | 0 refills | Status: DC
  Filled 2023-09-23: qty 60, 30d supply, fill #0

## 2023-09-23 MED ORDER — POTASSIUM CHLORIDE CRYS ER 20 MEQ PO TBCR: 20.0000 meq | EXTENDED_RELEASE_TABLET | Freq: Every day | ORAL | Status: DC

## 2023-09-23 MED ORDER — LEVALBUTEROL HCL 0.63 MG/3ML IN NEBU: 0.6300 mg | INHALATION_SOLUTION | Freq: Four times a day (QID) | RESPIRATORY_TRACT | Status: DC | PRN

## 2023-09-23 MED ORDER — EMPAGLIFLOZIN 10 MG PO TABS
10.0000 mg | ORAL_TABLET | Freq: Every day | ORAL | 0 refills | Status: DC
Start: 2023-09-23 — End: 2023-11-09
  Filled 2023-09-23: qty 30, 30d supply, fill #0

## 2023-09-23 NOTE — Progress Notes (Signed)
 Mobility Specialist Progress Note:    09/23/23 1550  Mobility  Activity Ambulated with assistance in hallway  Level of Assistance Standby assist, set-up cues, supervision of patient - no hands on  Assistive Device None  Distance Ambulated (ft) 400 ft  Activity Response Tolerated well  Mobility Referral Yes  Mobility visit 1 Mobility  Mobility Specialist Start Time (ACUTE ONLY) 1312  Mobility Specialist Stop Time (ACUTE ONLY) 1327  Mobility Specialist Time Calculation (min) (ACUTE ONLY) 15 min   Received pt in chair having no complaints and agreeable to mobility. Pt was asymptomatic throughout ambulation and returned to room w/o fault. Left in chair w/ call bell in reach and all needs met.   Thompson Grayer Mobility Specialist  Please contact vis Secure Chat or  Rehab Office [phone]

## 2023-09-23 NOTE — Progress Notes (Signed)
 Reviewed AVS, patient expressed understanding of medications, MD follow up reviewed.   Patient states all belongings brought to the hospital at time of admission are accounted for and packed to take home.  Picked up medications from [facility] pharmacy. Pt transported to entrance A where family member was waiting in vehicle to transport home.

## 2023-09-23 NOTE — TOC Transition Note (Addendum)
 Transition of Care [facility]) - Discharge Note   Patient Details  Name: Gloria Mccoy MRN: [id number] Date of Birth: 1946/05/14  [facility] CM/SW Contact:  Leone Haven, RN Phone Number: 09/23/2023, 1:50 PM   Clinical Narrative:    For possible dc today, per pt eval rec outpatient physical therapy, Patient states she would like to do this at [facility] outpatient physical therapy. Family at bedside to transport her home today.  NCM made referral thru epic for outpatient PT to [facility].         Patient Goals and CMS Choice            Discharge Placement                       Discharge Plan and Services Additional resources added to the After Visit Summary for                                       Social Drivers of Health (SDOH) Interventions SDOH Screenings   Food Insecurity: No Food Insecurity (09/21/2023)  Housing: Low Risk  (09/21/2023)  Transportation Needs: No Transportation Needs (09/21/2023)  Utilities: Not At Risk (09/21/2023)  Depression (PHQ2-9): Low Risk  (05/14/2019)  Social Connections: Unknown (09/21/2023)  Tobacco Use: Medium Risk (09/20/2023)     Readmission Risk Interventions     No data to display

## 2023-09-23 NOTE — Care Management Important Message (Signed)
 Important Message  Patient Details  Name: Gloria Mccoy MRN: [id number] Date of Birth: September 03, 1945   Important Message Given:  Yes - Medicare IM     Renie Ora 09/23/2023, 9:40 AM

## 2023-09-23 NOTE — Consult Note (Signed)
 [facility] Liaison Consult Note   09/23/2023  Gloria Mccoy Nov 14, 1945 [id number]  Insurance: [employer]  Primary Care Provider: Elfredia Nevins, MD, is with [facility], NOT a [facility] affiliate     [facility] Liaison met patient at bedside at [facility].  On unit HF rounding with patient's on green banner [THN]. Patient confirms ongoing follow up with PCP., daughters and husband at the bedside. Receive approval from patient to speak in from of family.   Patient's PCP is currently NOT an affiliate with [facility]]   [facility], [facility] does not replace or interfere with any arrangements made by the Inpatient Transition of Care team.   For questions contact:   Charlesetta Shanks, RN, BSN, CCM [place]  [facility], [facility] Liaison Direct Dial: [phone] or secure chat Email: [email or web address]

## 2023-09-23 NOTE — Discharge Summary (Signed)
 Physician Discharge Summary   Patient: Gloria Mccoy MRN: [id number] DOB: 07-26-1945  Admit date:     09/20/2023  Discharge date: 09/23/23  Discharge Physician: York Ram Vanessa Alesi   PCP: Elfredia Nevins, MD   Recommendations at discharge:    Patient will continue furosemide 40 mg daily and added SGLT 2 inh. Plan to give extra furosemide in case of volume overload.  Added Kcl supplementation  Continue losartan and carvedilol.  Follow up renal function and electrolytes in 7 days as outpatient  Follow up with Dr Sherwood Gambler in 7 to 10 days Follow up with Cardiology 05/14 at 11:00 am.   Discharge Diagnoses: Principal Problem:   Acute on chronic diastolic CHF (congestive heart failure) (HCC) Active Problems:   Hypokalemia   CAD S/P percutaneous coronary angioplasty   Essential hypertension   Borderline diabetes mellitus   Hypothyroid   Hyperlipidemia   Obesity (BMI 30-39.9)  Resolved Problems:   * No resolved hospital problems. [facility] Course: [name] was admitted to the hospital with the working diagnosis of heart failure exacerbation.   78 yo female with the past medical history of heart failure, coronary artery disease sp CABG, hyperlipidemia, hypertension, and history of breast cancer who presented with dyspnea. Reported worsening dyspnea for the last 4 days, with wheezing and coughing. Positive chest tightness. She was treated for allergies as outpatient with no improvement in her symptoms.  On her initial physical examination her blood pressure was 105/51, HR 68, RR 14 and 02 saturation 95%.  Lungs with no wheezing or rhonchi, heart with S1 and S2 present and regular with no gallops or rubs, abdomen with no distention, and no lower extremity edema,   Na 137, K 3.6 Cl 97, bicarbonate 29, glucose 309, bun 10 cr 0,94  Mg 2,0  Lactic acid 2,0  Procalcitonin < 0.10  Wbc 9,8 hgb 12.8 plt 262  Viral respiratory panel negative  Sars covid 19 negative Influenza and RSV  negative   Chest radiograph with cardiomegaly with bilateral hilar vascular congestion, no infiltrates, sternotomy wires in place.  CT chest with bilateral ground glass opacities. Positive cardiomegaly. No evidence of pulmonary embolism.   EKG 79 bpm, left axis deviation, qtc 465, sinus rhythm with q wave lead II, III, avF with no significant ST segment or T wave changes.   04/04 patient was placed on furosemide with good toleration.  Patient will continue furosemide and added SGLT 2 inh.   Assessment and Plan: * Acute on chronic diastolic CHF (congestive heart failure) (HCC) Echocardiogram with preserved LV systolic function with EF 70 to 75%, moderate asymmetric left ventricular hypertrophy of the basal segment, RV not well visualized, trivial pericardial effusion, trivial MR and TR.   Patient was placed on IV  furosemide for diuresis, negative fluid balance was achieved, - 4,331 ml with significant improvement in her volume status.   Plan to continue furosemide 40 mg daily and will add SGLT 2 inh.  Continue losartan and metoprolol.   Acute hypoxemic respiratory failure due to acute cardiogenic pulmonary edema.  Oxygenation has improved, she as able to ambulate on room air with no desaturation.  Respiratory failure resolved.   Hypokalemia K was corrected, at the time of her discharge her renal function had a serum cr of 0,84 with K at 4,2 and serum bicarbonate at 30  Na 139 mg 2.2   Patient will continue furosemide and added SGLT 2 inh  Follow up with renal function and electrolytes in 7 days.  CAD S/P percutaneous coronary angioplasty No chest pain, no signs of acute coronary syndrome.   Essential hypertension Continue with losartan, metoprolol.   Borderline diabetes mellitus Hgb A1c 6  Patient was placed on insulin sliding scale for glucose cover and monitoring.  At the time of her discharge her fasting glucose is 134 mg/dl.   Hypothyroid Continue levothyroxine    Hyperlipidemia Continue statin therapy   Obesity (BMI 30-39.9) Calculated BMI is 36.6 consistent with obesity class 2        Consultants: none  Procedures performed: none   Disposition: Home Diet recommendation:  Cardiac diet DISCHARGE MEDICATION: Allergies as of 09/23/2023       Reactions   Aloe Vera Itching   Per patient severe itching   Sulfa Antibiotics Shortness Of Breath   Sulfasalazine Shortness Of Breath        Medication List     TAKE these medications    Arexvy 120 MCG/0.5ML injection Generic drug: RSV vaccine recomb adjuvanted Inject 0.5 mLs into the muscle once.   aspirin EC 81 MG tablet Take 81 mg by mouth daily.   atorvastatin 80 MG tablet Commonly known as: LIPITOR TAKE 1 TABLET BY MOUTH ONCE DAILY AT 6PM What changed: See the new instructions.   empagliflozin 10 MG Tabs tablet Commonly known as: JARDIANCE Take 1 tablet (10 mg total) by mouth daily.   fish oil-omega-3 fatty acids 1000 MG capsule Take 1 g by mouth daily.   fluticasone 50 MCG/ACT nasal spray Commonly known as: FLONASE Place 2 sprays into both nostrils daily.   furosemide 20 MG tablet Commonly known as: LASIX Take 2 tablets (40 mg total) by mouth daily. What changed: medication strength   levothyroxine 75 MCG tablet Commonly known as: SYNTHROID Take 75 mcg by mouth daily. Take 1 tablet by mouth every day with 1 tablet of Levothyroxine for a combined dose of .   levothyroxine 25 MCG tablet Commonly known as: SYNTHROID Take 25 mcg by mouth daily. Take 1 tablet by mouth every day with 1 tablet of Levothyroxine for a combined dose of .   losartan 25 MG tablet Commonly known as: COZAAR Take 1 tablet (25 mg total) by mouth daily.   methocarbamol 500 MG tablet Commonly known as: ROBAXIN Take 500 mg by mouth in the morning and at bedtime.   metoprolol tartrate 25 MG tablet Commonly known as: LOPRESSOR TAKE 1 TABLET TWICE DAILY   montelukast  10 MG tablet Commonly known as: Singulair Take 1 tablet (10 mg total) by mouth at bedtime.   nitroGLYCERIN 0.4 MG SL tablet Commonly known as: NITROSTAT Place 0.4 mg under the tongue every 5 (five) minutes x 3 doses as needed for chest pain (if no relief after 3rd dose, proceed to ED or call 911).   olopatadine 0.1 % ophthalmic solution Commonly known as: Pataday Place 1 drop into both eyes 2 (two) times daily.   potassium chloride SA 20 MEQ tablet Commonly known as: KLOR-CON M Take 1 tablet (20 mEq total) by mouth daily. Start taking on: September 24, 2023   Spikevax syringe Generic drug: COVID-19 mRNA vaccine Inject 0.5 mLs into the muscle once.   VITAMIN B-12 PO Take 1 tablet by mouth daily.   Vitamin C Chew Chew 1 each by mouth daily.   VITAMIN D PO Take 1 tablet by mouth daily.   vitamin E 45 MG (100 UNITS) capsule Take 1 capsule (100 Units total) by mouth daily.  Follow-up Information     Elfredia Nevins, MD Follow up.   Specialty: Internal Medicine Why: The office will call patient. Contact information: [address] [place] [address] [phone]         [facility] Follow up.   Specialty: Rehabilitation Why: They will call you to set up apt times, if you do not hear from them in 3 days please give them a call. thank you Contact information: [address] [place] [address] [phone]               Discharge Exam: Filed Weights   09/21/23 0300 09/22/23 0441 09/23/23 0441  Weight: 88.6 kg 90.9 kg 89.6 kg   BP (!) 117/54 (BP Location: Right Arm)   Pulse 63   Temp 97.9 F (36.6 C) (Axillary)   Resp 16   Ht 5\' 2"  (1.575 m)   Wt 89.6 kg   SpO2 99%   BMI 36.13 kg/m    Patient is feeling better, dyspnea has improved, no lower extremity edema, PND or orthopnea.   Neurology awake and alert ENT with mild pallor Cardiovascular with S1 and S2 present and  regular with no gallops, rubs or murmurs No JVD Respiratory with no rales or wheezing, no rhonchi  Abdomen with no distention  No lower extremity edema   Condition at discharge: stable  The results of significant diagnostics from this hospitalization (including imaging, microbiology, ancillary and laboratory) are listed below for reference.   Imaging Studies: ECHOCARDIOGRAM COMPLETE Result Date: 09/21/2023    ECHOCARDIOGRAM REPORT   Patient Name:   ALAIA LORDI Date of Exam: 09/21/2023 Medical Rec #:  [id number]        Height:       62.0 in Accession #:    [id number]       Weight:       195.3 lb Date of Birth:  1946-02-11        BSA:          1.893 m Patient Age:    47 years         BP:           138/57 mmHg Patient Gender: F                HR:           76 bpm. Exam Location:  Inpatient Procedure: 2D Echo, Cardiac Doppler and Color Doppler (Both Spectral and Color            Flow Doppler were utilized during procedure). Indications:    I50.9* Heart failure (unspecified)  History:        Patient has prior history of Echocardiogram examinations, most                 recent 07/14/2017. CHF, Prior CABG; Risk Factors:Hypertension.  Sonographer:    Maxwell Marion Referring Phys: [id number] RONDELL A SMITH IMPRESSIONS  1. Left ventricular ejection fraction, by estimation, is 70 to 75%. The left ventricle has hyperdynamic function. The left ventricle has no regional wall motion abnormalities. There is moderate asymmetric left ventricular hypertrophy of the basal-septal  segment. Left ventricular diastolic parameters are indeterminate.  2. Right ventricular systolic function was not well visualized. The right ventricular size is not well visualized. Tricuspid regurgitation signal is inadequate for assessing PA pressure.  3. The mitral valve is normal in structure. Trivial mitral valve regurgitation. No evidence of mitral stenosis.  4. The aortic  valve was not well visualized. Aortic valve regurgitation is not  visualized. No aortic stenosis is present.  5. The inferior vena cava is normal in size with greater than 50% respiratory variability, suggesting right atrial pressure of 3 mmHg. FINDINGS  Left Ventricle: Left ventricular ejection fraction, by estimation, is 70 to 75%. The left ventricle has hyperdynamic function. The left ventricle has no regional wall motion abnormalities. Definity contrast agent was given IV to delineate the left ventricular endocardial borders. The left ventricular internal cavity size was normal in size. There is moderate asymmetric left ventricular hypertrophy of the basal-septal segment. Left ventricular diastolic parameters are indeterminate. Right Ventricle: The right ventricular size is not well visualized. Right vetricular wall thickness was not well visualized. Right ventricular systolic function was not well visualized. Tricuspid regurgitation signal is inadequate for assessing PA pressure. Left Atrium: Left atrial size was normal in size. Right Atrium: Right atrial size was not well visualized. Pericardium: Trivial pericardial effusion is present. Presence of epicardial fat layer. Mitral Valve: The mitral valve is normal in structure. Trivial mitral valve regurgitation. No evidence of mitral valve stenosis. MV peak gradient, 5.5 mmHg. The mean mitral valve gradient is 2.0 mmHg. Tricuspid Valve: The tricuspid valve is normal in structure. Tricuspid valve regurgitation is trivial. Aortic Valve: The aortic valve was not well visualized. Aortic valve regurgitation is not visualized. No aortic stenosis is present. Pulmonic Valve: The pulmonic valve was not well visualized. Pulmonic valve regurgitation is not visualized. Aorta: The aortic root and ascending aorta are structurally normal, with no evidence of dilitation. Venous: The inferior vena cava is normal in size with greater than 50% respiratory variability, suggesting right atrial pressure of 3 mmHg. IAS/Shunts: The interatrial septum  was not well visualized.  LEFT VENTRICLE PLAX 2D LVIDd:         3.90 cm      Diastology LVIDs:         2.80 cm      LV e' medial:    6.09 cm/s LV PW:         0.90 cm      LV E/e' medial:  16.9 LV IVS:        1.70 cm      LV e' lateral:   8.70 cm/s LVOT diam:     1.70 cm      LV E/e' lateral: 11.8 LVOT Area:     2.27 cm  LV Volumes (MOD) LV vol d, MOD A2C: 106.0 ml LV vol d, MOD A4C: 88.8 ml LV vol s, MOD A2C: 23.5 ml LV vol s, MOD A4C: 19.0 ml LV SV MOD A2C:     82.5 ml LV SV MOD A4C:     88.8 ml LV SV MOD BP:      77.0 ml RIGHT VENTRICLE RV S prime:     10.40 cm/s TAPSE (M-mode): 1.6 cm LEFT ATRIUM             Index LA diam:        3.70 cm 1.96 cm/m LA Vol (A2C):   52.6 ml 27.79 ml/m LA Vol (A4C):   44.4 ml 23.46 ml/m LA Biplane Vol: 49.7 ml 26.26 ml/m   AORTA Ao Asc diam: 3.30 cm MITRAL VALVE MV Area (PHT): 3.60 cm     SHUNTS MV Peak grad:  5.5 mmHg     Systemic Diam: 1.70 cm MV Mean grad:  2.0 mmHg MV Vmax:       1.17 m/s MV Vmean:  71.8 cm/s MV Decel Time: 211 msec MV E velocity: 103.00 cm/s MV A velocity: 96.30 cm/s MV E/A ratio:  1.07 Epifanio Lesches MD Electronically signed by Epifanio Lesches MD Signature Date/Time: 09/21/2023/8:13:58 PM    Final    CT Angio Chest PE W and/or Wo Contrast Result Date: 09/20/2023 CLINICAL DATA:  Pulmonary embolism (PE) suspected, high prob. Cough, chills, chest pain EXAM: CT ANGIOGRAPHY CHEST WITH CONTRAST TECHNIQUE: Multidetector CT imaging of the chest was performed using the standard protocol during bolus administration of intravenous contrast. Multiplanar CT image reconstructions and MIPs were obtained to evaluate the vascular anatomy. RADIATION DOSE REDUCTION: This exam was performed according to the departmental dose-optimization program which includes automated exposure control, adjustment of the mA and/or kV according to patient size and/or use of iterative reconstruction technique. CONTRAST:  75mL OMNIPAQUE IOHEXOL 350 MG/ML SOLN COMPARISON:  None  Available. FINDINGS: Cardiovascular: Prior CABG. Heart mildly enlarged. Aorta normal caliber. No filling defects in the pulmonary arteries to suggest pulmonary emboli. Mediastinum/Nodes: No mediastinal, hilar, or axillary adenopathy. Trachea and esophagus are unremarkable. Thyroid unremarkable. Lungs/Pleura: Mild ground-glass opacities scattered throughout the lungs may reflect early edema. Bibasilar atelectasis. No effusions. Upper Abdomen: No acute findings Musculoskeletal: Chest wall soft tissues are unremarkable. No acute bony abnormality. Review of the MIP images confirms the above findings. IMPRESSION: Cardiomegaly. Scattered ground-glass opacities throughout the lungs could reflect early edema. Bibasilar atelectasis. No evidence of pulmonary embolus. Electronically Signed   By: Charlett Nose M.D.   On: 09/20/2023 23:31   DG Chest Port 1 View Result Date: 09/20/2023 CLINICAL DATA:  Cough, chills, chest pain EXAM: PORTABLE CHEST 1 VIEW COMPARISON:  07/20/2023 FINDINGS: Prior CABG. Heart is borderline in size. No confluent airspace opacities, effusions or edema. No acute bony abnormality. Degenerative changes in the shoulders. IMPRESSION: No active disease. Electronically Signed   By: Charlett Nose M.D.   On: 09/20/2023 21:57    Microbiology: Results for orders placed or performed during the hospital encounter of 09/20/23  Resp panel by RT-PCR (RSV, Flu A&B, Covid) Anterior Nasal Swab     Status: None   Collection Time: 09/20/23  9:54 PM   Specimen: Anterior Nasal Swab  Result Value Ref Range Status   SARS Coronavirus 2 by RT PCR NEGATIVE NEGATIVE Final    Comment: (NOTE) SARS-CoV-2 target nucleic acids are NOT DETECTED.  The SARS-CoV-2 RNA is generally detectable in upper respiratory specimens during the acute phase of infection. The lowest concentration of SARS-CoV-2 viral copies this assay can detect is 138 copies/mL. A negative result does not preclude SARS-Cov-2 infection and should not be  used as the sole basis for treatment or other patient management decisions. A negative result may occur with  improper specimen collection/handling, submission of specimen other than nasopharyngeal swab, presence of viral mutation(s) within the areas targeted by this assay, and inadequate number of viral copies(<138 copies/mL). A negative result must be combined with clinical observations, patient history, and epidemiological information. The expected result is Negative.  Fact Sheet for Patients:  BloggerCourse.com  Fact Sheet for Healthcare Providers:  SeriousBroker.it  This test is no t yet approved or cleared by the [place] FDA and  has been authorized for detection and/or diagnosis of SARS-CoV-2 by FDA under an Emergency Use Authorization (EUA). This EUA will remain  in effect (meaning this test can be used) for the duration of the COVID-19 declaration under Section 564(b)(1) of the Act, 21 U.S.C.section 360bbb-3(b)(1), unless the authorization is terminated  or revoked  sooner.       Influenza A by PCR NEGATIVE NEGATIVE Final   Influenza B by PCR NEGATIVE NEGATIVE Final    Comment: (NOTE) The Xpert Xpress SARS-CoV-2/FLU/RSV plus assay is intended as an aid in the diagnosis of influenza from Nasopharyngeal swab specimens and should not be used as a sole basis for treatment. Nasal washings and aspirates are unacceptable for Xpert Xpress SARS-CoV-2/FLU/RSV testing.  Fact Sheet for Patients: BloggerCourse.com  Fact Sheet for Healthcare Providers: SeriousBroker.it  This test is not yet approved or cleared by the [place] FDA and has been authorized for detection and/or diagnosis of SARS-CoV-2 by FDA under an Emergency Use Authorization (EUA). This EUA will remain in effect (meaning this test can be used) for the duration of the COVID-19 declaration under Section  564(b)(1) of the Act, 21 U.S.C. section 360bbb-3(b)(1), unless the authorization is terminated or revoked.     Resp Syncytial Virus by PCR NEGATIVE NEGATIVE Final    Comment: (NOTE) Fact Sheet for Patients: BloggerCourse.com  Fact Sheet for Healthcare Providers: SeriousBroker.it  This test is not yet approved or cleared by the [place] FDA and has been authorized for detection and/or diagnosis of SARS-CoV-2 by FDA under an Emergency Use Authorization (EUA). This EUA will remain in effect (meaning this test can be used) for the duration of the COVID-19 declaration under Section 564(b)(1) of the Act, 21 U.S.C. section 360bbb-3(b)(1), unless the authorization is terminated or revoked.  Performed at [facility], [address] [place], [place] [address]   Group A Strep by PCR     Status: None   Collection Time: 09/20/23 10:00 PM   Specimen: Throat; Sterile Swab  Result Value Ref Range Status   Group A Strep by PCR NOT DETECTED NOT DETECTED Final    Comment: Performed at [facility], [address], [place], [place] [address]  Blood culture (routine x 2)     Status: None (Preliminary result)   Collection Time: 09/21/23 12:01 AM   Specimen: BLOOD  Result Value Ref Range Status   Specimen Description   Final    BLOOD RIGHT UPPER ARM Performed at [facility], [address], [place], [place] [address]    Special Requests   Final    BOTTLES DRAWN AEROBIC AND ANAEROBIC Blood Culture results may not be optimal due to an inadequate volume of blood received in culture bottles Performed at [facility], [address] [place], [place] [address]    Culture   Final    NO GROWTH 2 DAYS Performed at [facility], [address], [place], [place] [address]    Report Status PENDING  Incomplete  Blood culture (routine x 2)     Status: None  (Preliminary result)   Collection Time: 09/21/23 12:15 AM   Specimen: BLOOD  Result Value Ref Range Status   Specimen Description   Final    BLOOD BLOOD LEFT ARM Performed at [facility], [address] [place], [place] [address]    Special Requests   Final    BOTTLES DRAWN AEROBIC AND ANAEROBIC Blood Culture adequate volume Performed at [facility], [address] [place], [place] [address]    Culture   Final    NO GROWTH 2 DAYS Performed at [facility], [address], [place], [place] [address]    Report Status PENDING  Incomplete  Respiratory (~20 pathogens) panel by PCR     Status: None  Collection Time: 09/21/23  7:36 AM   Specimen: Nasopharyngeal Swab; Respiratory  Result Value Ref Range Status   Adenovirus NOT DETECTED NOT DETECTED Final   Coronavirus 229E NOT DETECTED NOT DETECTED Final    Comment: (NOTE) The Coronavirus on the Respiratory Panel, DOES NOT test for the novel  Coronavirus (2019 nCoV)    Coronavirus HKU1 NOT DETECTED NOT DETECTED Final   Coronavirus NL63 NOT DETECTED NOT DETECTED Final   Coronavirus OC43 NOT DETECTED NOT DETECTED Final   Metapneumovirus NOT DETECTED NOT DETECTED Final   Rhinovirus / Enterovirus NOT DETECTED NOT DETECTED Final   Influenza A NOT DETECTED NOT DETECTED Final   Influenza B NOT DETECTED NOT DETECTED Final   Parainfluenza Virus 1 NOT DETECTED NOT DETECTED Final   Parainfluenza Virus 2 NOT DETECTED NOT DETECTED Final   Parainfluenza Virus 3 NOT DETECTED NOT DETECTED Final   Parainfluenza Virus 4 NOT DETECTED NOT DETECTED Final   Respiratory Syncytial Virus NOT DETECTED NOT DETECTED Final   Bordetella pertussis NOT DETECTED NOT DETECTED Final   Bordetella Parapertussis NOT DETECTED NOT DETECTED Final   Chlamydophila pneumoniae NOT DETECTED NOT DETECTED Final   Mycoplasma pneumoniae NOT DETECTED NOT DETECTED Final    Comment: Performed at [facility], [address], [place], [place] [address]    Labs: CBC: Recent Labs  Lab 09/20/23 2154 09/21/23 1051 09/22/23 0315  WBC 13.3* 9.8 22.3*  NEUTROABS 8.8* 9.4*  --   HGB 12.8 12.8 11.6*  HCT 35.4* 34.9* 32.0*  MCV 83.3 83.5 83.1  PLT 246 262 259   Basic Metabolic Panel: Recent Labs  Lab 09/20/23 2154 09/21/23 1051 09/22/23 0315 09/23/23 0840  NA 138 137 140 139  K 3.3* 3.6 3.6 4.2  CL 98 97* 101 97*  CO2 31 29 28 30   GLUCOSE 119* 309* 124* 134*  BUN 14 10 16 16   CREATININE 0.67 0.94 0.92 0.84  CALCIUM 8.7* 8.6* 8.5* 9.1  MG  --  2.0  --  2.2   Liver Function Tests: Recent Labs  Lab 09/20/23 2154  AST 24  ALT 16  ALKPHOS 65  BILITOT 0.9  PROT 7.3  ALBUMIN 3.5   CBG: Recent Labs  Lab 09/22/23 1106 09/22/23 1524 09/22/23 2102 09/23/23 0546 09/23/23 1211  GLUCAP 114* 154* 133* 115* 88    Discharge time spent: greater than 30 minutes.  Signed: Coralie Keens, MD [facility] 09/23/2023

## 2023-09-26 ENCOUNTER — Other Ambulatory Visit (HOSPITAL_COMMUNITY): Payer: Self-pay | Admitting: Internal Medicine

## 2023-09-26 DIAGNOSIS — Z1231 Encounter for screening mammogram for malignant neoplasm of breast: Secondary | ICD-10-CM

## 2023-09-26 LAB — CULTURE, BLOOD (ROUTINE X 2)
Culture: NO GROWTH
Culture: NO GROWTH
Special Requests: ADEQUATE

## 2023-09-28 ENCOUNTER — Other Ambulatory Visit: Payer: Self-pay | Admitting: Cardiology

## 2023-09-28 NOTE — Assessment & Plan Note (Addendum)
 stage Ib triple negative breast cancer diagnosed in February 2020 status post left lumpectomy, adjuvant chemotherapy, and adjuvant radiation.    Breast Cancer Surveillance: Breast Exam: 09/29/23: Benign, slight rash underneath the breast Mammogram: Scheduled for 12/12/2023   Recommended that she exercise 30 minutes 5 days a week. RTC on an as-needed basis.

## 2023-09-29 ENCOUNTER — Inpatient Hospital Stay: Payer: Medicare HMO | Attending: Hematology and Oncology | Admitting: Hematology and Oncology

## 2023-09-29 ENCOUNTER — Other Ambulatory Visit (HOSPITAL_COMMUNITY): Payer: Self-pay | Admitting: Internal Medicine

## 2023-09-29 ENCOUNTER — Ambulatory Visit (HOSPITAL_COMMUNITY)
Admission: RE | Admit: 2023-09-29 | Discharge: 2023-09-29 | Disposition: A | Discharge status: Home / Self Care | Source: Ambulatory Visit | Attending: Internal Medicine | Admitting: Internal Medicine

## 2023-09-29 ENCOUNTER — Other Ambulatory Visit (HOSPITAL_COMMUNITY)
Admission: RE | Admit: 2023-09-29 | Discharge: 2023-09-29 | Disposition: A | Discharge status: Home / Self Care | Source: Ambulatory Visit | Attending: Cardiology | Admitting: Cardiology

## 2023-09-29 VITALS — BP 97/52 | HR 60 | Temp 98.1°F | Resp 16 | Ht 62.0 in | Wt 192.5 lb

## 2023-09-29 DIAGNOSIS — E6609 Other obesity due to excess calories: Secondary | ICD-10-CM | POA: Diagnosis not present

## 2023-09-29 DIAGNOSIS — I5031 Acute diastolic (congestive) heart failure: Secondary | ICD-10-CM | POA: Diagnosis not present

## 2023-09-29 DIAGNOSIS — Z1732 Human epidermal growth factor receptor 2 negative status: Secondary | ICD-10-CM | POA: Insufficient documentation

## 2023-09-29 DIAGNOSIS — Z171 Estrogen receptor negative status [ER-]: Secondary | ICD-10-CM | POA: Insufficient documentation

## 2023-09-29 DIAGNOSIS — R0989 Other specified symptoms and signs involving the circulatory and respiratory systems: Secondary | ICD-10-CM | POA: Insufficient documentation

## 2023-09-29 DIAGNOSIS — I25118 Atherosclerotic heart disease of native coronary artery with other forms of angina pectoris: Secondary | ICD-10-CM

## 2023-09-29 DIAGNOSIS — Z882 Allergy status to sulfonamides status: Secondary | ICD-10-CM | POA: Diagnosis not present

## 2023-09-29 DIAGNOSIS — Z951 Presence of aortocoronary bypass graft: Secondary | ICD-10-CM | POA: Insufficient documentation

## 2023-09-29 DIAGNOSIS — I509 Heart failure, unspecified: Secondary | ICD-10-CM | POA: Diagnosis not present

## 2023-09-29 DIAGNOSIS — N6489 Other specified disorders of breast: Secondary | ICD-10-CM | POA: Insufficient documentation

## 2023-09-29 DIAGNOSIS — Z6832 Body mass index (BMI) 32.0-32.9, adult: Secondary | ICD-10-CM | POA: Diagnosis not present

## 2023-09-29 DIAGNOSIS — M79605 Pain in left leg: Secondary | ICD-10-CM | POA: Insufficient documentation

## 2023-09-29 DIAGNOSIS — C50512 Malignant neoplasm of lower-outer quadrant of left female breast: Secondary | ICD-10-CM | POA: Diagnosis not present

## 2023-09-29 DIAGNOSIS — Z7982 Long term (current) use of aspirin: Secondary | ICD-10-CM | POA: Diagnosis not present

## 2023-09-29 DIAGNOSIS — L03032 Cellulitis of left toe: Secondary | ICD-10-CM | POA: Diagnosis not present

## 2023-09-29 DIAGNOSIS — Z79899 Other long term (current) drug therapy: Secondary | ICD-10-CM | POA: Insufficient documentation

## 2023-09-29 DIAGNOSIS — Z1722 Progesterone receptor negative status: Secondary | ICD-10-CM | POA: Diagnosis not present

## 2023-09-29 DIAGNOSIS — I10 Essential (primary) hypertension: Secondary | ICD-10-CM | POA: Diagnosis not present

## 2023-09-29 LAB — BASIC METABOLIC PANEL WITH GFR
Anion gap: 11 (ref 5–15)
BUN: 21 mg/dL (ref 8–23)
CO2: 27 mmol/L (ref 22–32)
Calcium: 9.1 mg/dL (ref 8.9–10.3)
Chloride: 99 mmol/L (ref 98–111)
Creatinine, Ser: 1.17 mg/dL — ABNORMAL HIGH (ref 0.44–1.00)
GFR, Estimated: 48 mL/min — ABNORMAL LOW (ref >60)
Glucose, Bld: 130 mg/dL — ABNORMAL HIGH (ref 70–99)
Potassium: 4.4 mmol/L (ref 3.5–5.1)
Sodium: 137 mmol/L (ref 135–145)

## 2023-09-29 NOTE — Progress Notes (Signed)
 Patient Care Team: Elfredia Nevins, MD as PCP - General (Internal Medicine) Wyline Mood Dorothe Pea, MD as PCP - Cardiology (Cardiology) Jethro Bolus, MD as Consulting Physician (Ophthalmology) Griselda Miner, MD as Consulting Physician (General Surgery) Pollyann Savoy, MD as Consulting Physician (Rheumatology) Axel Filler, Larna Daughters, NP as Nurse Practitioner (Hematology and Oncology) Serena Croissant, MD as Consulting Physician (Hematology and Oncology)  DIAGNOSIS:  Encounter Diagnosis  Name Primary?   Malignant neoplasm of lower-outer quadrant of left breast of female, estrogen receptor negative (HCC) Yes    SUMMARY OF ONCOLOGIC HISTORY: Oncology History  Malignant neoplasm of lower-outer quadrant of left breast of female, estrogen receptor negative (HCC)  08/01/2018 Initial Diagnosis   Screening mammography revealed an abnormality in the left breast. Diagnostic testing revealed a 0.7 cm mass at 7 o'clock, 7 cm from the nipple. No lymphadenopathy seen in the left axilla. Biopsy ([id number]) revealed: IDC, grade II; ER 0%, PR 0%, Ki67 40%, HER2 negative (1+). Clinical T1c N0.   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  The Hereditary Gene Panel offered by [facility] includes sequencing and/or deletion duplication testing of the following 47 genes: APC, ATM, AXIN2, BARD1, BMPR1A, BRCA1, BRCA2, BRIP1, CDH1, CDK4, CDKN2A (p14ARF), CDKN2A (p16INK4a), CHEK2, CTNNA1, DICER1, EPCAM (Deletion/duplication testing only), GREM1 (promoter region deletion/duplication testing only), KIT, MEN1, MLH1, MSH2, MSH3, MSH6, MUTYH, NBN, NF1, NHTL1, PALB2, PDGFRA, PMS2, POLD1, POLE, PTEN, RAD50, RAD51C, RAD51D, SDHB, SDHC, SDHD, SMAD4, SMARCA4. STK11, TP53, TSC1, TSC2, and VHL.  The following genes were evaluated for sequence changes only: SDHA and HOXB13 c.251G>A variant only. The report date is 08-29-2018.   08/24/2018 Breast MRI   Bilateral breast MRI with and  without contrast.  Right breast: No suspicious mass or abnormal enhancement. Benign intramammary lymph nodes in the outer right breast.   Left breast: In the posterior third of the lower inner quadrant of the left breast is an irregular enhancing mass spanning approximately 1.3 x 0.8 x 0.5 cm with internal biopsy clip artifact and washout kinetics, corresponding to the   biopsy-proven malignancy. The mass/enhancement do not involve the pectoralis muscle. Benign intramammary lymph node in the middle third of the outer left breast is mammographically stable. There is more glandular tissue in the left breast on the right, corresponding with chronic appearance of mammograms dating back to at least 2010.  Lymph nodes: No abnormal appearing lymph nodes.   09/01/2018 Surgery   Left lumpectomy [name]) [phone]): IDC, nottingham grade 2 of 3, 1.1 cm. Margins uninvolved. 0/3 lymph nodes positive for carcinoma. Pathologic stage pT1c pN0.   09/13/2018 Cancer Staging   Staging form: Breast, AJCC 8th Edition - Pathologic: Stage IB (pT1c, pN0, cM0, G2, ER-, PR-, HER2-) - Signed by [name], NP on 09/13/2018   10/24/2018 - 04/05/2019 Chemotherapy   Patient is on Treatment Plan : BREAST Adjuvant CMF IV q21d     12/05/2018 - 01/19/2019 Radiation Therapy   [name]) The patient initially received a dose of 50.4 Gy in 28 fractions to the breast using whole-breast tangent fields. This was delivered using a 3-D conformal technique. The patient then received a boost to the seroma. This delivered an additional 10 Gy in 5 fractions using a 3 field photon boost technique. The total dose was 60.4 Gy.     CHIEF COMPLIANT: History of triple negative breast cancer for surveillance  HISTORY OF PRESENT ILLNESS:   History of Present Illness Miss Gloria Mccoy, a breast cancer survivor, presents  for a five-year follow-up. Over the past year, she has been hospitalized twice, initially for pneumonia and later for the flu.  She also experienced an allergic reaction that caused eye swelling and a runny nose. During her last hospitalization, she was diagnosed with congestive heart failure (CHF), which has since improved. She denies any breast pain or discomfort.  In addition to her breast cancer and CHF, she has a history of frequent urination. She has not been engaging in regular exercise, but has been active around the house. She is due for a mammogram in June.     ALLERGIES:  is allergic to aloe vera, sulfa antibiotics, and sulfasalazine.  MEDICATIONS:  Current Outpatient Medications  Medication Sig Dispense Refill   AREXVY 120 MCG/0.5ML injection Inject 0.5 mLs into the muscle once.     aspirin EC 81 MG tablet Take 81 mg by mouth daily.     atorvastatin (LIPITOR) 80 MG tablet TAKE 1 TABLET ONE TIME DAILY AT 6PM 90 tablet 3   Bioflavonoid Products (VITAMIN C) CHEW Chew 1 each by mouth daily.     Cholecalciferol (VITAMIN D PO) Take 1 tablet by mouth daily.      Cyanocobalamin (VITAMIN B-12 PO) Take 1 tablet by mouth daily.     empagliflozin (JARDIANCE) 10 MG TABS tablet Take 1 tablet (10 mg total) by mouth daily. 30 tablet 0   fish oil-omega-3 fatty acids 1000 MG capsule Take 1 g by mouth daily.     fluticasone (FLONASE) 50 MCG/ACT nasal spray Place 2 sprays into both nostrils daily. 16 g 0   furosemide (LASIX) 20 MG tablet Take 2 tablets (40 mg total) by mouth daily. 60 tablet 0   levothyroxine (SYNTHROID) 25 MCG tablet Take 25 mcg by mouth daily. Take 1 tablet by mouth every day with 1 tablet of Levothyroxine for a combined dose of .     levothyroxine (SYNTHROID, LEVOTHROID) 75 MCG tablet Take 75 mcg by mouth daily. Take 1 tablet by mouth every day with 1 tablet of Levothyroxine for a combined dose of .     losartan (COZAAR) 25 MG tablet Take 1 tablet (25 mg total) by mouth daily. 90 tablet 1   methocarbamol (ROBAXIN) 500 MG tablet Take 500 mg by mouth in the morning and at bedtime.      metoprolol tartrate (LOPRESSOR) 25 MG tablet TAKE 1 TABLET TWICE DAILY 180 tablet 3   montelukast (SINGULAIR) 10 MG tablet Take 1 tablet (10 mg total) by mouth at bedtime. 30 tablet 0   nitroGLYCERIN (NITROSTAT) 0.4 MG SL tablet Place 0.4 mg under the tongue every 5 (five) minutes x 3 doses as needed for chest pain (if no relief after 3rd dose, proceed to ED or call 911).     olopatadine (PATADAY) 0.1 % ophthalmic solution Place 1 drop into both eyes 2 (two) times daily. 5 mL 0   potassium chloride SA (KLOR-CON M) 20 MEQ tablet Take 1 tablet (20 mEq total) by mouth daily. 30 tablet 0   SPIKEVAX syringe Inject 0.5 mLs into the muscle once.     vitamin E 100 UNIT capsule Take 1 capsule (100 Units total) by mouth daily. 30 capsule 0   No current facility-administered medications for this visit.    PHYSICAL EXAMINATION: ECOG PERFORMANCE STATUS: 1 - Symptomatic but completely ambulatory  Vitals:   09/29/23 0818 09/29/23 0820  BP: (!) 88/53 (!) 97/52  Pulse: 60   Resp: 16   Temp: 98.1 F (36.7  C)   SpO2: 98%    Filed Weights   09/29/23 0818  Weight: 192 lb 8 oz (87.3 kg)    Physical Exam BREAST: Breasts normal on palpation.  (exam performed in the presence of a chaperone)  LABORATORY DATA:  I have reviewed the data as listed    Latest Ref Rng & Units 09/23/2023    8:40 AM 09/22/2023    3:15 AM 09/21/2023   10:51 AM  CMP  Glucose 70 - 99 mg/dL 119  147  829   BUN 8 - 23 mg/dL 16  16  10    Creatinine 0.44 - 1.00 mg/dL 5.62  1.30  8.65   Sodium 135 - 145 mmol/L 139  140  137   Potassium 3.5 - 5.1 mmol/L 4.2  3.6  3.6   Chloride 98 - 111 mmol/L 97  101  97   CO2 22 - 32 mmol/L 30  28  29    Calcium 8.9 - 10.3 mg/dL 9.1  8.5  8.6     Lab Results  Component Value Date   WBC 22.3 (H) 09/22/2023   HGB 11.6 (L) 09/22/2023   HCT 32.0 (L) 09/22/2023   MCV 83.1 09/22/2023   PLT 259 09/22/2023   NEUTROABS 9.4 (H) 09/21/2023    ASSESSMENT & PLAN:  Malignant neoplasm of  lower-outer quadrant of left breast of female, estrogen receptor negative (HCC) stage Ib triple negative breast cancer diagnosed in February 2020 status post left lumpectomy, adjuvant chemotherapy, and adjuvant radiation.    Breast Cancer Surveillance: Breast Exam: 09/29/23: Benign, slight rash underneath the breast Mammogram: Scheduled for 12/12/2023   Recommended that she exercise 30 minutes 5 days a week. RTC in 1 year for follow-up with Mardella Layman for long-term survivorship    No orders of the defined types were placed in this encounter.  The patient has a good understanding of the overall plan. she agrees with it. she will call with any problems that may develop before the next visit here. Total time spent: 30 mins including face to face time and time spent for planning, charting and co-ordination of care   Tamsen Meek, MD 09/29/23

## 2023-09-30 ENCOUNTER — Other Ambulatory Visit: Payer: Self-pay | Admitting: Cardiology

## 2023-09-30 MED ORDER — LOSARTAN POTASSIUM 25 MG PO TABS: 25.0000 mg | ORAL_TABLET | Freq: Every day | ORAL | 1 refills | Status: DC

## 2023-10-12 ENCOUNTER — Other Ambulatory Visit: Payer: Self-pay | Admitting: Cardiology

## 2023-10-13 ENCOUNTER — Encounter: Payer: Self-pay | Admitting: *Deleted

## 2023-10-19 NOTE — Therapy (Signed)
 OUTPATIENT PHYSICAL THERAPY LOWER EXTREMITY EVALUATION   Patient Name: Gloria Mccoy MRN: [id number] DOB:[birth year], 78 y.o., female Today's Date: 10/20/2023  END OF SESSION:  PT End of Session - 10/20/23 0928     Visit Number 1    Number of Visits 6    Date for PT Re-Evaluation 12/01/23    Authorization Type [employer]    Authorization Time Period Auth requested    Progress Note Due on Visit 6    PT Start Time 0931    PT Stop Time 1013    PT Time Calculation (min) 42 min    Activity Tolerance Patient tolerated treatment well    Behavior During Therapy WFL for tasks assessed/performed             Past Medical History:  Diagnosis Date   Arthritis    "hands sometimes" (07/13/2017)   Coronary artery disease    a. s/p CABG x3 in 06/2017 with LIMA-LAD, SVG-D1, and SVG-RI.  b. 10/2017: cath showing a widely patent LIMA-LAD with ostial occlusion of the SVG-RI and subtotally occluded atretic SVG-D1. Graft occlusion thought to be 2ry to improvement in pre-CABG stenoses.    Essential hypertension    Family history of adverse reaction to anesthesia    sister had a complication that was stated she had a "foggy" episode after her breast surgery was readmitted 1 day post op after being discharged from the hospital. Sister also has significant lung problems that could have contributed to this    Family history of breast cancer    Family history of colon cancer    Hypothyroid    Meniere disease    Myocardial infarction (HCC) 06/2017   during [facility] rehab   Obesity    Osteopenia 01/2012   T score -1.3 FRAX 7.9%/0.6%   Personal history of chemotherapy 2021   Personal history of radiation therapy 2021   left breast   Past Surgical History:  Procedure Laterality Date   BREAST LUMPECTOMY WITH RADIOACTIVE SEED AND SENTINEL LYMPH NODE BIOPSY Left 09/01/2018   Procedure: LEFT BREAST LUMPECTOMY WITH RADIOACTIVE SEED AND SENTINEL LYMPH NODE BIOPSY;  Surgeon: Caralyn Chandler, MD;   Location: [facility] OR;  Service: General;  Laterality: Left;   BREAST SURGERY Left 2020   INVASIVE DUCTAL CARCINOMA/DUCTAL CARCINOMA IN SITU/MARGINS UNINVOLVED   CARDIAC CATHETERIZATION  07/13/2017   CATARACT EXTRACTION W/PHACO Right 01/28/2015   Procedure: CATARACT EXTRACTION PHACO AND INTRAOCULAR LENS PLACEMENT :  CDE:  5.70;  Surgeon: Albert Huff, MD;  Location: [facility] ORS;  Service: Ophthalmology;  Laterality: Right;   CATARACT EXTRACTION W/PHACO Left 02/11/2015   Procedure: CATARACT EXTRACTION PHACO AND INTRAOCULAR LENS PLACEMENT (IOC);  Surgeon: Albert Huff, MD;  Location: [facility] ORS;  Service: Ophthalmology;  Laterality: Left;  CDE: 7.38   COLONOSCOPY N/A 09/26/2012   Procedure: COLONOSCOPY;  Surgeon: Beau Bound, MD;  Location: [facility] SU[facility];  Service: Gastroenterology;  Laterality: N/A;   CORONARY ARTERY BYPASS GRAFT N/A 07/15/2017   Procedure: CORONARY ARTERY BYPASS GRAFTING (CABG) X 3 USING LEFT INTERNAL MAMMARY ARTERY AND RIGHT SAPHENOUS VEIN- ENDOSCOPICALLY HARVESTED;  Surgeon: Zelphia Higashi, MD;  Location: [facility] OR;  Service: Open Heart Surgery;  Laterality: N/A;   IR IMAGING GUIDED PORT INSERTION  12/29/2018   IR REMOVAL TUN ACCESS W/ PORT W/O FL MOD SED  04/26/2019   LEFT HEART CATH AND CORONARY ANGIOGRAPHY N/A 07/13/2017   Procedure: LEFT HEART CATH AND CORONARY ANGIOGRAPHY;  Surgeon: [place]az[place], Peter M, MD;  Location: [facility]  INVASIVE CV LAB;  Service: Cardiovascular;  Laterality: N/A;   LEFT HEART CATH AND CORS/GRAFTS ANGIOGRAPHY N/A 11/03/2017   Procedure: LEFT HEART CATH AND CORS/GRAFTS ANGIOGRAPHY;  Surgeon: Millicent Ally, MD;  Location: [facility] INVASIVE CV LAB;  Service: Cardiovascular;  Laterality: N/A;   TEE WITHOUT CARDIOVERSION N/A 07/15/2017   Procedure: TRANSESOPHAGEAL ECHOCARDIOGRAM (TEE);  Surgeon: Zelphia Higashi, MD;  Location: [facility] OR;  Service: Open Heart Surgery;  Laterality: N/A;   TUBAL LIGATION     TYMPANOPLASTY Left    fluid from ear drum   Patient Active Problem  List   Diagnosis Date Noted   Acute on chronic diastolic CHF (congestive heart failure) (HCC) 09/21/2023   SIRS (systemic inflammatory response syndrome) (HCC) 09/21/2023   Acute respiratory failure with hypoxia (HCC) 09/21/2023   Hypokalemia 09/21/2023   Chest pain 09/21/2023   Hyperlipidemia 09/21/2023   Port-A-Cath in place 01/16/2019   Genetic testing 08/29/2018   Family history of breast cancer    Family history of colon cancer    Malignant neoplasm of lower-outer quadrant of left breast of female, estrogen receptor negative (HCC) 08/16/2018   NSTEMI (non-ST elevated myocardial infarction) (HCC) 11/02/2017   S/P CABG x 3 07/15/2017   CAD S/P percutaneous coronary angioplasty 07/15/2017   Unstable angina (HCC) 07/13/2017   Essential hypertension 07/13/2017   Obesity (BMI 30-39.9) 07/13/2017   Borderline diabetes mellitus 07/13/2017   Hypothyroid 01/06/2012   Meniere disease    Arthritis     PCP: Kathyleen Parkins, MD  REFERRING PROVIDER: Albertus Alt, MD  REFERRING DIAG: Other abnormalities of gait and mobility (R26.89);Unsteadiness on feet (R26.81)  THERAPY DIAG:  Impaired functional mobility, balance, gait, and endurance  Rationale for Evaluation and Treatment: Rehabilitation  ONSET DATE: Jan/Feb   SUBJECTIVE:                                                                                                                                                                                                         SUBJECTIVE STATEMENT: Unsure of if she needs PT. Reports she was at [facility] in [place] at the beginning of April and she had two different therapists. One told her she didn't need therapy as long as she stayed active and the other referred her for therapy. Reports she was in the hospital for fluid around her heart so they may want her to work on endurance. Later she says she was having falls.  Initially says that she doesn't have any pain but later  reports that sometimes she has some neck and shoulder  pain but it comes and goes. Right now, it isn't that bad. Hand dominance: Left  PERTINENT HISTORY:  3 stents in 2019 Heart Attack in 2021 Breast Cancer 2022  PAIN:  Are you having pain? No  PRECAUTIONS: None  RED FLAGS: None     WEIGHT BEARING RESTRICTIONS: No  FALLS:  Has patient fallen in last 6 months? Yes. Number of falls 4 falls in one day back in Jan or February when she had flu but none prior and none since   LIVING ENVIRONMENT: Lives with: lives with their family Lives with husband and daughter Stairs: Yes: External: 4 in the back and 3 in the front steps; bilateral but cannot reach both Has following equipment at home: Single point cane. Only used SPC after those falls but not prior or since  OCCUPATION: Retired  PLOF: Independent with basic ADLs  PATIENT GOALS: "Improving neck pain and losing weight"  NEXT MD VISIT: 10/27/23   OBJECTIVE:  Note: Objective measures were completed at Evaluation unless otherwise noted.  DIAGNOSTIC FINDINGS:    PATIENT SURVEYS:  ABC scale Next Session  COGNITION: Overall cognitive status: Difficulty to assess due to: no family present  SENSATION: WFL  POSTURE: rounded shoulders and forward head  PALPATION:   LOWER EXTREMITY ROM:  Active ROM Right eval Left eval  Hip flexion    Hip extension    Hip abduction    Hip adduction    Hip internal rotation    Hip external rotation    Knee flexion    Knee extension    Ankle dorsiflexion    Ankle plantarflexion    Ankle inversion    Ankle eversion     (Blank rows = not tested)  LOWER EXTREMITY MMT:  MMT Right eval Left eval  Hip flexion    Hip extension    Hip abduction    Hip adduction    Hip internal rotation    Hip external rotation    Knee flexion    Knee extension    Ankle dorsiflexion    Ankle plantarflexion    Ankle inversion    Ankle eversion     (Blank rows = not tested)  LOWER EXTREMITY  SPECIAL TESTS:    FUNCTIONAL TESTS:  5 times sit to stand: Next Session 30 seconds chair stand test Next Session Timed up and go (TUG): Next Session 2 minute walk test: Next Session  GAIT: Distance walked: 50 Assistive device utilized: None Level of assistance: Complete Independence Comments: Mild unsteadiness, Dec velocity, forward trunk lean, inc thoracic kyphosis. No overt LOB   TREATMENT DATE:  10/20/23:  PT evaluation                                                                                                                             PATIENT EDUCATION:  Education details: PT evaluation, objective findings, POC, Importance of HEP, Precautions, Clinic policies  Person educated: Patient Education method: Psychiatrist  comprehension: verbalized understanding and returned demonstration  HOME EXERCISE PROGRAM:   ASSESSMENT:  CLINICAL IMPRESSION: Patient is a 78 y.o. female who was seen today for physical therapy evaluation and treatment for Other abnormalities of gait and mobility (R26.89);Unsteadiness on feet (R26.81). Patient demonstrated poor posture and the above gait deviations on this date. Patient reported previous falls at the beginning of April when she had the flu but none prior or since. Evaluation not completed on this date. Will plan to complete next session.   OBJECTIVE IMPAIRMENTS: Abnormal gait and postural dysfunction.   ACTIVITY LIMITATIONS:  Will assess next session   PARTICIPATION LIMITATIONS:  Will assess next session   REHAB POTENTIAL: Good  CLINICAL DECISION MAKING: Stable/uncomplicated  EVALUATION COMPLEXITY: Low   GOALS: Goals reviewed with patient? No  SHORT TERM GOALS: Target date: 11/03/23 Patient will be independent with performance of HEP to demonstrate adequate self management of symptoms.  Baseline:  Goal status: INITIAL  2.   Patient will report at least a 25% improvement with function or pain overall  since beginning PT. Baseline:  Goal status: INITIAL   LONG TERM GOALS: Target date: 12/01/23  Will complete next session    PLAN:  PT FREQUENCY: 1-2x/week  PT DURATION: 6 weeks  PLANNED INTERVENTIONS: 97164- PT Re-evaluation, 97110-Therapeutic exercises, 97530- Therapeutic activity, 97112- Neuromuscular re-education, 97535- Self Care, 96045- Manual therapy, 97012- Traction (mechanical), Patient/Family education, Dry Needling, Joint mobilization, Spinal mobilization, Scar mobilization, DME instructions, and Moist heat  PLAN FOR NEXT SESSION: LE MMT, Functional/Fall risk tests, DGI, HEP   12:14 PM, 10/20/23 Gloria Mccoy, PT, DPT [facility] Auth Request  Referring diagnosis code (ICD 10)? Other abnormalities of gait and mobility (R26.89);Unsteadiness on feet (R26.81) Treatment diagnosis codes (ICD 10)? (if different than referring diagnosis)  Z74.09 What was this (referring dx) caused by? []  Surgery [x]  Fall []  Ongoing issue []  Arthritis []  Other: ____________  Laterality: []  Rt []  Lt [x]  Both  Deficits: []  Pain []  Stiffness []  Weakness []  Edema []  Balance Deficits []  Coordination [x]  Gait Disturbance []  ROM []  Other   Functional Tool Score: Next Session   CPT codes: See Planned Interventions listed in the Plan section of the Evaluation.

## 2023-10-20 ENCOUNTER — Encounter (HOSPITAL_COMMUNITY): Payer: Self-pay

## 2023-10-20 ENCOUNTER — Other Ambulatory Visit: Payer: Self-pay

## 2023-10-20 ENCOUNTER — Ambulatory Visit (HOSPITAL_COMMUNITY): Attending: Internal Medicine

## 2023-10-20 DIAGNOSIS — Z9181 History of falling: Secondary | ICD-10-CM | POA: Diagnosis not present

## 2023-10-20 DIAGNOSIS — Z7409 Other reduced mobility: Secondary | ICD-10-CM | POA: Insufficient documentation

## 2023-10-20 DIAGNOSIS — I5032 Chronic diastolic (congestive) heart failure: Secondary | ICD-10-CM | POA: Diagnosis not present

## 2023-10-24 ENCOUNTER — Ambulatory Visit (HOSPITAL_COMMUNITY)

## 2023-10-24 ENCOUNTER — Encounter (HOSPITAL_COMMUNITY): Payer: Self-pay

## 2023-10-24 ENCOUNTER — Other Ambulatory Visit: Payer: Self-pay

## 2023-10-24 DIAGNOSIS — Z9181 History of falling: Secondary | ICD-10-CM

## 2023-10-24 DIAGNOSIS — Z7409 Other reduced mobility: Secondary | ICD-10-CM | POA: Diagnosis not present

## 2023-10-24 DIAGNOSIS — I5032 Chronic diastolic (congestive) heart failure: Secondary | ICD-10-CM | POA: Diagnosis not present

## 2023-10-24 NOTE — Therapy (Signed)
 OUTPATIENT PHYSICAL THERAPY LOWER EXTREMITY EVALUATION   Patient Name: Gloria Mccoy MRN: [id number] DOB:06/16/1946, 78 y.o., female Today's Date: 10/24/2023  END OF SESSION:  PT End of Session - 10/24/23 0719     Visit Number 2    Number of Visits 6    Date for PT Re-Evaluation 12/01/23    Authorization Type [employer]    Authorization Time Period Auth requested    Progress Note Due on Visit 6    PT Start Time 0720    PT Stop Time 0757    PT Time Calculation (min) 37 min    Activity Tolerance Patient tolerated treatment well    Behavior During Therapy [facility] for tasks assessed/performed             Past Medical History:  Diagnosis Date   Arthritis    "hands sometimes" (07/13/2017)   Coronary artery disease    a. s/p CABG x3 in 06/2017 with LIMA-LAD, SVG-D1, and SVG-RI.  b. 10/2017: cath showing a widely patent LIMA-LAD with ostial occlusion of the SVG-RI and subtotally occluded atretic SVG-D1. Graft occlusion thought to be 2ry to improvement in pre-CABG stenoses.    Essential hypertension    Family history of adverse reaction to anesthesia    sister had a complication that was stated she had a "foggy" episode after her breast surgery was readmitted 1 day post op after being discharged from the hospital. Sister also has significant lung problems that could have contributed to this    Family history of breast cancer    Family history of colon cancer    Hypothyroid    Meniere disease    Myocardial infarction (HCC) 06/2017   during [facility] rehab   Obesity    Osteopenia 01/2012   T score -1.3 FRAX 7.9%/0.6%   Personal history of chemotherapy 2021   Personal history of radiation therapy 2021   left breast   Past Surgical History:  Procedure Laterality Date   BREAST LUMPECTOMY WITH RADIOACTIVE SEED AND SENTINEL LYMPH NODE BIOPSY Left 09/01/2018   Procedure: LEFT BREAST LUMPECTOMY WITH RADIOACTIVE SEED AND SENTINEL LYMPH NODE BIOPSY;  Surgeon: Caralyn Chandler, MD;   Location: [facility] OR;  Service: General;  Laterality: Left;   BREAST SURGERY Left 2020   INVASIVE DUCTAL CARCINOMA/DUCTAL CARCINOMA IN SITU/MARGINS UNINVOLVED   CARDIAC CATHETERIZATION  07/13/2017   CATARACT EXTRACTION W/PHACO Right 01/28/2015   Procedure: CATARACT EXTRACTION PHACO AND INTRAOCULAR LENS PLACEMENT :  CDE:  5.70;  Surgeon: Albert Huff, MD;  Location: [facility] ORS;  Service: Ophthalmology;  Laterality: Right;   CATARACT EXTRACTION W/PHACO Left 02/11/2015   Procedure: CATARACT EXTRACTION PHACO AND INTRAOCULAR LENS PLACEMENT (IOC);  Surgeon: Albert Huff, MD;  Location: [facility] ORS;  Service: Ophthalmology;  Laterality: Left;  CDE: 7.38   COLONOSCOPY N/A 09/26/2012   Procedure: COLONOSCOPY;  Surgeon: Beau Bound, MD;  Location: [facility] END[facility] SUITE;  Service: Gastroenterology;  Laterality: N/A;   CORONARY ARTERY BYPASS GRAFT N/A 07/15/2017   Procedure: CORONARY ARTERY BYPASS GRAFTING (CABG) X 3 USING LEFT INTERNAL MAMMARY ARTERY AND RIGHT SAPHENOUS VEIN- ENDOSCOPICALLY HARVESTED;  Surgeon: Zelphia Higashi, MD;  Location[facility] OR;  Service: Open Heart Surgery;  Laterality: N/A;   IR IMAGING GUIDED PORT INSERTION  12/29/2018   IR REMOVAL TUN ACCESS W/ PORT W/O FL MOD SED  04/26/2019   LEFT HEART CATH AND CORONARY ANGIOGRAPHY N/A 07/13/2017   Procedure: LEFT HEART CATH AND CORONARY ANGIOGRAPHY;  Surgeon: Swaz[place], Peter M, MD;  Location: [facility]  INVASIVE CV LAB;  Service: Cardiovascular;  Laterality: N/A;   LEFT HEART CATH AND CORS/GRAFTS ANGIOGRAPHY N/A 11/03/2017   Procedure: LEFT HEART CATH AND CORS/GRAFTS ANGIOGRAPHY;  Surgeon: Millicent Ally, MD;  Location: [facility] INVASIVE CV LAB;  Service: Cardiovascular;  Laterality: N/A;   TEE WITHOUT CARDIOVERSION N/A 07/15/2017   Procedure: TRANSESOPHAGEAL ECHOCARDIOGRAM (TEE);  Surgeon: Zelphia Higashi, MD;  Location: [facility] OR;  Service: Open Heart Surgery;  Laterality: N/A;   TUBAL LIGATION     TYMPANOPLASTY Left    fluid from ear drum   Patient Active Problem  List   Diagnosis Date Noted   Acute on chronic diastolic CHF (congestive heart failure) (HCC) 09/21/2023   SIRS (systemic inflammatory response syndrome) (HCC) 09/21/2023   Acute respiratory failure with hypoxia (HCC) 09/21/2023   Hypokalemia 09/21/2023   Chest pain 09/21/2023   Hyperlipidemia 09/21/2023   Port-A-Cath in place 01/16/2019   Genetic testing 08/29/2018   Family history of breast cancer    Family history of colon cancer    Malignant neoplasm of lower-outer quadrant of left breast of female, estrogen receptor negative (HCC) 08/16/2018   NSTEMI (non-ST elevated myocardial infarction) (HCC) 11/02/2017   S/P CABG x 3 07/15/2017   CAD S/P percutaneous coronary angioplasty 07/15/2017   Unstable angina (HCC) 07/13/2017   Essential hypertension 07/13/2017   Obesity (BMI 30-39.9) 07/13/2017   Borderline diabetes mellitus 07/13/2017   Hypothyroid 01/06/2012   Meniere disease    Arthritis     PCP: Kathyleen Parkins, MD  REFERRING PROVIDER: Albertus Alt, MD  REFERRING DIAG: Other abnormalities of gait and mobility (R26.89);Unsteadiness on feet (R26.81)  THERAPY DIAG:  Impaired functional mobility, balance, gait, and endurance  History of falling  Rationale for Evaluation and Treatment: Rehabilitation  ONSET DATE: Jan/Feb   SUBJECTIVE:                                                                                                                                                                                                         SUBJECTIVE STATEMENT: Unsure of if she needs PT. Reports she was at [facility] in [place] at the beginning of April and she had two different therapists. One told her she didn't need therapy as long as she stayed active and the other referred her for therapy. Reports she was in the hospital for fluid around her heart so they may want her to work on endurance. Later she says she was having falls.  Initially says that she doesn't have  any pain but later reports that sometimes she has  some neck and shoulder pain but it comes and goes. Right now, it isn't that bad. Hand dominance: Left  PERTINENT HISTORY:  3 stents in 2019 Heart Attack in 2021 Breast Cancer 2022  PAIN:  Are you having pain? No  PRECAUTIONS: None  RED FLAGS: None     WEIGHT BEARING RESTRICTIONS: No  FALLS:  Has patient fallen in last 6 months? Yes. Number of falls 4 falls in one day back in Jan or February when she had flu but none prior and none since   LIVING ENVIRONMENT: Lives with: lives with their family Lives with husband and daughter Stairs: Yes: External: 4 in the back and 3 in the front steps; bilateral but cannot reach both Has following equipment at home: Single point cane. Only used SPC after those falls but not prior or since  OCCUPATION: Retired  PLOF: Independent with basic ADLs  PATIENT GOALS: "Improving neck pain and losing weight"  NEXT MD VISIT: 10/27/23   OBJECTIVE:  Note: Objective measures were completed at Evaluation unless otherwise noted.  DIAGNOSTIC FINDINGS:    PATIENT SURVEYS:  Total ABC score: 1440 / 1600 = 90.0 %  COGNITION: Overall cognitive status: Difficulty to assess due to: no family present  SENSATION: WFL  POSTURE: rounded shoulders and forward head  PALPATION:   LOWER EXTREMITY ROM:  Active ROM Right eval Left eval  Hip flexion    Hip extension    Hip abduction    Hip adduction    Hip internal rotation    Hip external rotation    Knee flexion    Knee extension    Ankle dorsiflexion    Ankle plantarflexion    Ankle inversion    Ankle eversion     (Blank rows = not tested)  LOWER EXTREMITY MMT:  MMT Right eval Left eval  Hip flexion 4- 4-  Hip extension 3+ 3+  Hip abduction 3+ 3+  Hip adduction    Hip internal rotation    Hip external rotation    Knee flexion 4- 4-  Knee extension 4- 4-  Ankle dorsiflexion 4+ 4+  Ankle plantarflexion    Ankle inversion    Ankle  eversion     (Blank rows = not tested)  LOWER EXTREMITY SPECIAL TESTS:    FUNCTIONAL TESTS:  30 seconds chair stand test: 9.5 STS Timed up and go (TUG): 11 seconds 2 minute walk test: 405' Semi-tandem/tandem: 30", mild swaying t/o SLS: R: 5", L: 4"   GAIT: Distance walked: 50 Assistive device utilized: None Level of assistance: Complete Independence Comments: Mild unsteadiness at times, Dec velocity, forward trunk lean, inc thoracic kyphosis. No overt LOB DGI 1. Gait level surface (3) Normal: Walks 20', no assistive devices, good sped, no evidence for imbalance, normal gait pattern 2. Change in gait speed (2) Mild Impairment: Is able to change speed but demonstrates mild gait deviations, or not gait deviations but unable to achieve a significant change in velocity, or uses an assistive device. 3. Gait with horizontal head turns (1) Moderate Impairment: Performs head turns with moderate change in gait velocity, slows down, staggers but recovers, can continue to walk. 4. Gait with vertical head turns 1) Moderate Impairment: Performs head turns with moderate change in gait velocity, slows down, staggers but recovers, can continue to walk. 5. Gait and pivot turn (2) Mild Impairment: Pivot turns safely in > 3 seconds and stops with no loss of balance. 6. Step over obstacle (2) Mild Impairment: Is able to step over  box, but must slow down and adjust steps to clear box safely. 7. Step around obstacles (2) Mild Impairment: Is able to step around both cones, but must slow down and adjust steps to clear cones. 8. Stairs (2) Mild Impairment: Alternating feet, must use rail.  TOTAL SCORE: 15 / 24   TREATMENT DATE:  10/24/23:  MMT  DGI  30 sec chair rise test  TUG  HEP given   10/20/23:  PT Evaluation                                                                                                                             PATIENT EDUCATION:  Education details: PT evaluation,  objective findings, POC, Importance of HEP, Precautions, Clinic policies  Person educated: Patient Education method: Explanation and Demonstration Education comprehension: verbalized understanding and returned demonstration  HOME EXERCISE PROGRAM: Access Code: [email or web address] URL: [email or web address] Date: 10/24/2023 Prepared by: Virgia Griffins Powell-Butler  Exercises - Standing Single Leg Stance with Counter Support  - 2 x daily - 7 x weekly - 3 sets - 30 hold - Standing Hip Extension with Counter Support  - 2 x daily - 7 x weekly - 2 sets - 10 reps - Standing Hip Abduction with Counter Support  - 2 x daily - 7 x weekly - 2 sets - 10 reps  ASSESSMENT:  CLINICAL IMPRESSION: Patient is a 78 y.o. female who was seen today for physical therapy evaluation and treatment for Other abnormalities of gait and mobility (R26.89);Unsteadiness on feet (R26.81). Patient demonstrated poor posture, the above gait deviations, decreased LE strength, impaired single leg balance, and impaired balance with multiple aspects during DGI on this date which could all lead to increased risk of falls. Patient reported previous falls at the beginning of April when she had the flu but none prior or since. Patient will benefit from continued skilled physical therapy in order to address the above in order to reeduce risk of falls and improve function/QOL.    OBJECTIVE IMPAIRMENTS: Abnormal gait and postural dysfunction.   ACTIVITY LIMITATIONS:  Will assess next session   PARTICIPATION LIMITATIONS:  Will assess next session   REHAB POTENTIAL: Good  CLINICAL DECISION MAKING: Stable/uncomplicated  EVALUATION COMPLEXITY: Low   GOALS: Goals reviewed with patient? No  SHORT TERM GOALS: Target date: 11/03/23 Patient will be independent with performance of HEP to demonstrate adequate self management of symptoms.  Baseline:  Goal status: INITIAL  2.   Patient will report at least a 25% improvement with function or  pain overall since beginning PT. Baseline:  Goal status: INITIAL   LONG TERM GOALS: Target date: 12/01/23  Patient will improve ABS Scale score by at least 5% to demonstrate improved perceived function with balance.  Baseline: Goal status: INITIAL  Patient will score at least a  4- on bilat. hip ext and abd MMT to show increased LE strength and/or power and improve ambulation/gait mechanics.  Baseline:  Goal status: INITIAL 3.  Patient will be able to maintain SL balance for at least 10" bilaterally in order to improve function with everyday task such as stair navigation. Baseline: Goal status: INITIAL 4. Patient will improve DGI score by at least 20/24 in order to demonstrate Improved dynamic balance for decreased risk of falls.  Baseline: Goal status: INITIAL  PLAN:  PT FREQUENCY: 1-2x/week  PT DURATION: 6 weeks  PLANNED INTERVENTIONS: 97164- PT Re-evaluation, 97110-Therapeutic exercises, 97530- Therapeutic activity, [id number]- Neuromuscular re-education, 97535- Self Care, 40981- Manual therapy, 97012- Traction (mechanical), Patient/Family education, Dry Needling, Joint mobilization, Spinal mobilization, Scar mobilization, DME instructions, and Moist heat  PLAN FOR NEXT SESSION: target SL balance, and dynamic balance (esp. Ambulation with head turns), prog LE strengthening   8:00 AM, 10/24/23 Marysue Sola, PT, DPT [facility] Auth Request  Referring diagnosis code (ICD 10)? Other abnormalities of gait and mobility (R26.89);Unsteadiness on feet (R26.81) Treatment diagnosis codes (ICD 10)? (if different than referring diagnosis)  Z74.09 Z91.81 What was this (referring dx) caused by? []  Surgery [x]  Fall []  Ongoing issue []  Arthritis []  Other: ____________  Laterality: []  Rt []  Lt [x]  Both  Deficits: []  Pain []  Stiffness []  Weakness []  Edema [x]  Balance Deficits []  Coordination [x]  Gait Disturbance []  ROM []   Other   Functional Tool Score:   Total ABC score: 1440 / 1600 = 90.0 % 30 seconds chair stand test: 9.5 STS Timed up and go (TUG): 11 seconds 2 minute walk test: 405' Semi-tandem/tandem: 30", mild swaying t/o SLS: R: 5", L: 4"  CPT codes: See Planned Interventions listed in the Plan section of the Evaluation.

## 2023-11-03 ENCOUNTER — Ambulatory Visit (HOSPITAL_COMMUNITY): Admitting: Physical Therapy

## 2023-11-03 DIAGNOSIS — Z9181 History of falling: Secondary | ICD-10-CM | POA: Diagnosis not present

## 2023-11-03 DIAGNOSIS — Z7409 Other reduced mobility: Secondary | ICD-10-CM

## 2023-11-03 DIAGNOSIS — I5032 Chronic diastolic (congestive) heart failure: Secondary | ICD-10-CM | POA: Diagnosis not present

## 2023-11-03 NOTE — Therapy (Signed)
 OUTPATIENT PHYSICAL THERAPY LOWER EXTREMITY TREATMENT   Patient Name: Gloria Mccoy MRN: [id number] DOB:Mar 09, 1946, 78 y.o., female Today's Date: 11/03/2023  END OF SESSION:  PT End of Session - 11/03/23 0730     Visit Number 3    Number of Visits 6    Date for PT Re-Evaluation 12/01/23    Authorization Type [employer]    Authorization Time Period Auth requested    Progress Note Due on Visit 6    PT Start Time 0720    PT Stop Time 0800    PT Time Calculation (min) 40 min    Activity Tolerance Patient tolerated treatment well    Behavior During Therapy WFL for tasks assessed/performed             Past Medical History:  Diagnosis Date   Arthritis    "hands sometimes" (07/13/2017)   Coronary artery disease    a. s/p CABG x3 in 06/2017 with LIMA-LAD, SVG-D1, and SVG-RI.  b. 10/2017: cath showing a widely patent LIMA-LAD with ostial occlusion of the SVG-RI and subtotally occluded atretic SVG-D1. Graft occlusion thought to be 2ry to improvement in pre-CABG stenoses.    Essential hypertension    Family history of adverse reaction to anesthesia    sister had a complication that was stated she had a "foggy" episode after her breast surgery was readmitted 1 day post op after being discharged from the hospital. Sister also has significant lung problems that could have contributed to this    Family history of breast cancer    Family history of colon cancer    Hypothyroid    Meniere disease    Myocardial infarction (HCC) 06/2017   during [facility] rehab   Obesity    Osteopenia 01/2012   T score -1.3 FRAX 7.9%/0.6%   Personal history of chemotherapy 2021   Personal history of radiation therapy 2021   left breast   Past Surgical History:  Procedure Laterality Date   BREAST LUMPECTOMY WITH RADIOACTIVE SEED AND SENTINEL LYMPH NODE BIOPSY Left 09/01/2018   Procedure: LEFT BREAST LUMPECTOMY WITH RADIOACTIVE SEED AND SENTINEL LYMPH NODE BIOPSY;  Surgeon: Caralyn Chandler, MD;   Location: [facility] OR;  Service: General;  Laterality: Left;   BREAST SURGERY Left 2020   INVASIVE DUCTAL CARCINOMA/DUCTAL CARCINOMA IN SITU/MARGINS UNINVOLVED   CARDIAC CATHETERIZATION  07/13/2017   CATARACT EXTRACTION W/PHACO Right 01/28/2015   Procedure: CATARACT EXTRACTION PHACO AND INTRAOCULAR LENS PLACEMENT :  CDE:  5.70;  Surgeon: Albert Huff, MD;  Location: [facility] ORS;  Service: Ophthalmology;  Laterality: Right;   CATARACT EXTRACTION W/PHACO Left 02/11/2015   Procedure: CATARACT EXTRACTION PHACO AND INTRAOCULAR LENS PLACEMENT (IOC);  Surgeon: Albert Huff, MD;  Location: [facility] ORS;  Service: Ophthalmology;  Laterality: Left;  CDE: 7.38   COLONOSCOPY N/A 09/26/2012   Procedure: COLONOSCOPY;  Surgeon: Beau Bound, MD;  Location: [facility] SU[facility];  Service: Gastroenterology;  Laterality: N/A;   CORONARY ARTERY BYPASS GRAFT N/A 07/15/2017   Procedure: CORONARY ARTERY BYPASS GRAFTING (CABG) X 3 USING LEFT INTERNAL MAMMARY ARTERY AND RIGHT SAPHENOUS VEIN- ENDOSCOPICALLY HARVESTED;  Surgeon: Zelphia Higashi, MD;  Location: [facility] OR;  Service: Open Heart Surgery;  Laterality: N/A;   IR IMAGING GUIDED PORT INSERTION  12/29/2018   IR REMOVAL TUN ACCESS W/ PORT W/O FL MOD SED  04/26/2019   LEFT HEART CATH AND CORONARY ANGIOGRAPHY N/A 07/13/2017   Procedure: LEFT HEART CATH AND CORONARY ANGIOGRAPHY;  Surgeon: [place], Peter M, MD;  Location: [facility]  INVASIVE CV LAB;  Service: Cardiovascular;  Laterality: N/A;   LEFT HEART CATH AND CORS/GRAFTS ANGIOGRAPHY N/A 11/03/2017   Procedure: LEFT HEART CATH AND CORS/GRAFTS ANGIOGRAPHY;  Surgeon: Millicent Ally, MD;  Location: [facility] INVASIVE CV LAB;  Service: Cardiovascular;  Laterality: N/A;   TEE WITHOUT CARDIOVERSION N/A 07/15/2017   Procedure: TRANSESOPHAGEAL ECHOCARDIOGRAM (TEE);  Surgeon: Zelphia Higashi, MD;  Location: [facility] OR;  Service: Open Heart Surgery;  Laterality: N/A;   TUBAL LIGATION     TYMPANOPLASTY Left    fluid from ear drum   Patient Active Problem  List   Diagnosis Date Noted   Acute on chronic diastolic CHF (congestive heart failure) (HCC) 09/21/2023   SIRS (systemic inflammatory response syndrome) (HCC) 09/21/2023   Acute respiratory failure with hypoxia (HCC) 09/21/2023   Hypokalemia 09/21/2023   Chest pain 09/21/2023   Hyperlipidemia 09/21/2023   Port-A-Cath in place 01/16/2019   Genetic testing 08/29/2018   Family history of breast cancer    Family history of colon cancer    Malignant neoplasm of lower-outer quadrant of left breast of female, estrogen receptor negative (HCC) 08/16/2018   NSTEMI (non-ST elevated myocardial infarction) (HCC) 11/02/2017   S/P CABG x 3 07/15/2017   CAD S/P percutaneous coronary angioplasty 07/15/2017   Unstable angina (HCC) 07/13/2017   Essential hypertension 07/13/2017   Obesity (BMI 30-39.9) 07/13/2017   Borderline diabetes mellitus 07/13/2017   Hypothyroid 01/06/2012   Meniere disease    Arthritis     PCP: Kathyleen Parkins, MD  REFERRING PROVIDER: Albertus Alt, MD  REFERRING DIAG: Other abnormalities of gait and mobility (R26.89);Unsteadiness on feet (R26.81)  THERAPY DIAG:  Impaired functional mobility, balance, gait, and endurance  History of falling  Rationale for Evaluation and Treatment: Rehabilitation  ONSET DATE: Jan/Feb   SUBJECTIVE:                                                                                                                                                                                                         SUBJECTIVE STATEMENT: Pt states she is doing well today overall.  No pain or issues.    Evaluation: Unsure of if she needs PT. Reports she was at [facility] in [place] at the beginning of April and she had two different therapists. One told her she didn't need therapy as long as she stayed active and the other referred her for therapy. Reports she was in the hospital for fluid around her heart so they may want her to work on  endurance. Later she says she was having  falls.  Initially says that she doesn't have any pain but later reports that sometimes she has some neck and shoulder pain but it comes and goes. Right now, it isn't that bad. Hand dominance: Left  PERTINENT HISTORY:  3 stents in 2019 Heart Attack in 2021 Breast Cancer 2022  PAIN:  Are you having pain? No  PRECAUTIONS: None  RED FLAGS: None     WEIGHT BEARING RESTRICTIONS: No  FALLS:  Has patient fallen in last 6 months? Yes. Number of falls 4 falls in one day back in Jan or February when she had flu but none prior and none since   LIVING ENVIRONMENT: Lives with: lives with their family Lives with husband and daughter Stairs: Yes: External: 4 in the back and 3 in the front steps; bilateral but cannot reach both Has following equipment at home: Single point cane. Only used SPC after those falls but not prior or since  OCCUPATION: Retired  PLOF: Independent with basic ADLs  PATIENT GOALS: "Improving neck pain and losing weight"  NEXT MD VISIT: 10/27/23   OBJECTIVE:  Note: Objective measures were completed at Evaluation unless otherwise noted.  DIAGNOSTIC FINDINGS:    PATIENT SURVEYS:  Total ABC score: 1440 / 1600 = 90.0 %  COGNITION: Overall cognitive status: Difficulty to assess due to: no family present  SENSATION: WFL  POSTURE: rounded shoulders and forward head  PALPATION:   LOWER EXTREMITY ROM:  Active ROM Right eval Left eval  Hip flexion    Hip extension    Hip abduction    Hip adduction    Hip internal rotation    Hip external rotation    Knee flexion    Knee extension    Ankle dorsiflexion    Ankle plantarflexion    Ankle inversion    Ankle eversion     (Blank rows = not tested)  LOWER EXTREMITY MMT:  MMT Right eval Left eval  Hip flexion 4- 4-  Hip extension 3+ 3+  Hip abduction 3+ 3+  Hip adduction    Hip internal rotation    Hip external rotation    Knee flexion 4- 4-  Knee  extension 4- 4-  Ankle dorsiflexion 4+ 4+  Ankle plantarflexion    Ankle inversion    Ankle eversion     (Blank rows = not tested)  LOWER EXTREMITY SPECIAL TESTS:    FUNCTIONAL TESTS:  30 seconds chair stand test: 9.5 STS Timed up and go (TUG): 11 seconds 2 minute walk test: 405' Semi-tandem/tandem: 30", mild swaying t/o SLS: R: 5", L: 4"   GAIT: Distance walked: 50 Assistive device utilized: None Level of assistance: Complete Independence Comments: Mild unsteadiness at times, Dec velocity, forward trunk lean, inc thoracic kyphosis. No overt LOB DGI 1. Gait level surface (3) Normal: Walks 20', no assistive devices, good sped, no evidence for imbalance, normal gait pattern 2. Change in gait speed (2) Mild Impairment: Is able to change speed but demonstrates mild gait deviations, or not gait deviations but unable to achieve a significant change in velocity, or uses an assistive device. 3. Gait with horizontal head turns (1) Moderate Impairment: Performs head turns with moderate change in gait velocity, slows down, staggers but recovers, can continue to walk. 4. Gait with vertical head turns 1) Moderate Impairment: Performs head turns with moderate change in gait velocity, slows down, staggers but recovers, can continue to walk. 5. Gait and pivot turn (2) Mild Impairment: Pivot turns safely in > 3 seconds and stops  with no loss of balance. 6. Step over obstacle (2) Mild Impairment: Is able to step over box, but must slow down and adjust steps to clear box safely. 7. Step around obstacles (2) Mild Impairment: Is able to step around both cones, but must slow down and adjust steps to clear cones. 8. Stairs (2) Mild Impairment: Alternating feet, must use rail.  TOTAL SCORE: 15 / 24   TREATMENT DATE:  11/03/23 Nustep seat 7 UE/LE level 3 5 minutes Standing:  heelraises 20X  Hip abduction 2X10 each  Hip extension 2X10 each  Tandem stance 2X30" each  SLS best time of 3 trials  each Rt:9"  Lt:10" max  Lunges onto 4" step no UE 2X10 Sit to stands no UE assist 10X    10/24/23:  MMT  DGI  30 sec chair rise test  TUG  HEP given   10/20/23:  PT Evaluation                                                                                                                             PATIENT EDUCATION:  Education details: PT evaluation, objective findings, POC, Importance of HEP, Precautions, Clinic policies  Person educated: Patient Education method: Explanation and Demonstration Education comprehension: verbalized understanding and returned demonstration  HOME EXERCISE PROGRAM: Access Code: [email or web address] URL: [email or web address] Date: 10/24/2023 Prepared by: Virgia Griffins Powell-Butler  Exercises - Standing Single Leg Stance with Counter Support  - 2 x daily - 7 x weekly - 3 sets - 30 hold - Standing Hip Extension with Counter Support  - 2 x daily - 7 x weekly - 2 sets - 10 reps - Standing Hip Abduction with Counter Support  - 2 x daily - 7 x weekly - 2 sets - 10 reps  ASSESSMENT:  CLINICAL IMPRESSION: Began session with nustep and progressed to standing strengthening/stabilization exercises today.  Pt with more difficulty maintaining tandem stance with Lt leading rather than Rt. Pt with 10" single leg stance time before needing UE's to steady self.  Added forward lunges to help improve glut and LE stability.  Sit to stands completed for glut and quad strength.  Overall, pt completed all activities well with cues only needed for general form and hold times.  Pt also required postural cues as tends to use body and momentum with hip exercises.  Pt required one seated rest break during session today. Pt will continue to benefit from skilled therapy.    Patient is a 78 y.o. female who was seen today for physical therapy evaluation and treatment for Other abnormalities of gait and mobility (R26.89);Unsteadiness on feet (R26.81). Patient demonstrated poor  posture, the above gait deviations, decreased LE strength, impaired single leg balance, and impaired balance with multiple aspects during DGI on this date which could all lead to increased risk of falls. Patient reported previous falls at the beginning of April when she had the flu but none prior or since. Patient will benefit from  continued skilled physical therapy in order to address the above in order to reeduce risk of falls and improve function/QOL.    OBJECTIVE IMPAIRMENTS: Abnormal gait and postural dysfunction.   ACTIVITY LIMITATIONS: pt reports no limitations   PARTICIPATION LIMITATIONS: pt reports no limitations  REHAB POTENTIAL: Good  CLINICAL DECISION MAKING: Stable/uncomplicated  EVALUATION COMPLEXITY: Low   GOALS: Goals reviewed with patient? No  SHORT TERM GOALS: Target date: 11/03/23 Patient will be independent with performance of HEP to demonstrate adequate self management of symptoms.  Baseline:  Goal status: INITIAL  2.   Patient will report at least a 25% improvement with function or pain overall since beginning PT. Baseline:  Goal status: INITIAL   LONG TERM GOALS: Target date: 12/01/23  Patient will improve ABS Scale score by at least 5% to demonstrate improved perceived function with balance.  Baseline: Goal status: INITIAL  Patient will score at least a  4- on bilat. hip ext and abd MMT to show increased LE strength and/or power and improve ambulation/gait mechanics.  Baseline:  Goal status: INITIAL 3.   Patient will be able to maintain SL balance for at least 10" bilaterally in order to improve function with everyday task such as stair navigation. Baseline: Goal status: INITIAL 4. Patient will improve DGI score by at least 20/24 in order to demonstrate Improved dynamic balance for decreased risk of falls.  Baseline: Goal status: INITIAL  PLAN:  PT FREQUENCY: 1-2x/week  PT DURATION: 6 weeks  PLANNED INTERVENTIONS: 97164- PT Re-evaluation,  97110-Therapeutic exercises, 97530- Therapeutic activity, 97112- Neuromuscular re-education, 97535- Self Care, [id number]- Manual therapy, 97012- Traction (mechanical), Patient/Family education, Dry Needling, Joint mobilization, Spinal mobilization, Scar mobilization, DME instructions, and Moist heat  PLAN FOR NEXT SESSION:continue to progress single leg balance progressing to dynamic balance (esp. Ambulation with head turns), progress LE strengthening.   Lorenso Romance, PTA/CLT [facility] Ph: [phone]  7:32 AM, 11/03/23

## 2023-11-07 ENCOUNTER — Ambulatory Visit: Admitting: Nurse Practitioner

## 2023-11-08 ENCOUNTER — Ambulatory Visit (HOSPITAL_COMMUNITY): Admitting: Physical Therapy

## 2023-11-08 DIAGNOSIS — Z7409 Other reduced mobility: Secondary | ICD-10-CM | POA: Diagnosis not present

## 2023-11-08 DIAGNOSIS — Z9181 History of falling: Secondary | ICD-10-CM

## 2023-11-08 DIAGNOSIS — I5032 Chronic diastolic (congestive) heart failure: Secondary | ICD-10-CM | POA: Diagnosis not present

## 2023-11-08 NOTE — Therapy (Signed)
 OUTPATIENT PHYSICAL THERAPY LOWER EXTREMITY TREATMENT   Patient Name: Gloria Mccoy MRN: [id number] DOB:04-Sep-1945, 78 y.o., female Today's Date: 11/08/2023  END OF SESSION:  PT End of Session - 11/08/23 0732     Visit Number 4    Number of Visits 6    Date for PT Re-Evaluation 12/01/23    Authorization Type [employer]    Authorization Time Period Auth requested    Progress Note Due on Visit 6    PT Start Time 0720    PT Stop Time 0800    PT Time Calculation (min) 40 min    Activity Tolerance Patient tolerated treatment well    Behavior During Therapy [facility] for tasks assessed/performed             Past Medical History:  Diagnosis Date   Arthritis    "hands sometimes" (07/13/2017)   Coronary artery disease    a. s/p CABG x3 in 06/2017 with LIMA-LAD, SVG-D1, and SVG-RI.  b. 10/2017: cath showing a widely patent LIMA-LAD with ostial occlusion of the SVG-RI and subtotally occluded atretic SVG-D1. Graft occlusion thought to be 2ry to improvement in pre-CABG stenoses.    Essential hypertension    Family history of adverse reaction to anesthesia    sister had a complication that was stated she had a "foggy" episode after her breast surgery was readmitted 1 day post op after being discharged from the hospital. Sister also has significant lung problems that could have contributed to this    Family history of breast cancer    Family history of colon cancer    Hypothyroid    Meniere disease    Myocardial infarction (HCC) 06/2017   during cardaic rehab   Obesity    Osteopenia 01/2012   T score -1.3 FRAX 7.9%/0.6%   Personal history of chemotherapy 2021   Personal history of radiation therapy 2021   left breast   Past Surgical History:  Procedure Laterality Date   BREAST LUMPECTOMY WITH RADIOACTIVE SEED AND SENTINEL LYMPH NODE BIOPSY Left 09/01/2018   Procedure: LEFT BREAST LUMPECTOMY WITH RADIOACTIVE SEED AND SENTINEL LYMPH NODE BIOPSY;  Surgeon: Caralyn Chandler, MD;   Location: [facility] OR;  Service: General;  Laterality: Left;   BREAST SURGERY Left 2020   INVASIVE DUCTAL CARCINOMA/DUCTAL CARCINOMA IN SITU/MARGINS UNINVOLVED   CARDIAC CATHETERIZATION  07/13/2017   CATARACT EXTRACTION W/PHACO Right 01/28/2015   Procedure: CATARACT EXTRACTION PHACO AND INTRAOCULAR LENS PLACEMENT :  CDE:  5.70;  Surgeon: Albert Huff, MD;  Location: [facility] ORS;  Service: Ophthalmology;  Laterality: Right;   CATARACT EXTRACTION W/PHACO Left 02/11/2015   Procedure: CATARACT EXTRACTION PHACO AND INTRAOCULAR LENS PLACEMENT (IOC);  Surgeon: Albert Huff, MD;  Location: [facility] ORS;  Service: Ophthalmology;  Laterality: Left;  CDE: 7.38   COLONOSCOPY N/A 09/26/2012   Procedure: COLONOSCOPY;  Surgeon: Beau Bound, MD;  Location: [facility] ENDO SUITE;  Service: Gastroenterology;  Laterality: N/A;   CORONARY ARTERY BYPASS GRAFT N/A 07/15/2017   Procedure: CORONARY ARTERY BYPASS GRAFTING (CABG) X 3 USING LEFT INTERNAL MAMMARY ARTERY AND RIGHT SAPHENOUS VEIN- ENDOSCOPICALLY HARVESTED;  Surgeon: Zelphia Higashi, MD;  Location[facility] OR;  Service: Open Heart Surgery;  Laterality: N/A;   IR IMAGING GUIDED PORT INSERTION  12/29/2018   IR REMOVAL TUN ACCESS W/ PORT W/O FL MOD SED  04/26/2019   LEFT HEART CATH AND CORONARY ANGIOGRAPHY N/A 07/13/2017   Procedure: LEFT HEART CATH AND CORONARY ANGIOGRAPHY;  Surgeon: Swaz[place], Peter M, MD;  Location: [facility]  INVASIVE CV LAB;  Service: Cardiovascular;  Laterality: N/A;   LEFT HEART CATH AND CORS/GRAFTS ANGIOGRAPHY N/A 11/03/2017   Procedure: LEFT HEART CATH AND CORS/GRAFTS ANGIOGRAPHY;  Surgeon: Millicent Ally, MD;  Location: [facility] INVASIVE CV LAB;  Service: Cardiovascular;  Laterality: N/A;   TEE WITHOUT CARDIOVERSION N/A 07/15/2017   Procedure: TRANSESOPHAGEAL ECHOCARDIOGRAM (TEE);  Surgeon: Zelphia Higashi, MD;  Location: [facility] OR;  Service: Open Heart Surgery;  Laterality: N/A;   TUBAL LIGATION     TYMPANOPLASTY Left    fluid from ear drum   Patient Active Problem  List   Diagnosis Date Noted   Acute on chronic diastolic CHF (congestive heart failure) (HCC) 09/21/2023   SIRS (systemic inflammatory response syndrome) (HCC) 09/21/2023   Acute respiratory failure with hypoxia (HCC) 09/21/2023   Hypokalemia 09/21/2023   Chest pain 09/21/2023   Hyperlipidemia 09/21/2023   Port-A-Cath in place 01/16/2019   Genetic testing 08/29/2018   Family history of breast cancer    Family history of colon cancer    Malignant neoplasm of lower-outer quadrant of left breast of female, estrogen receptor negative (HCC) 08/16/2018   NSTEMI (non-ST elevated myocardial infarction) (HCC) 11/02/2017   S/P CABG x 3 07/15/2017   CAD S/P percutaneous coronary angioplasty 07/15/2017   Unstable angina (HCC) 07/13/2017   Essential hypertension 07/13/2017   Obesity (BMI 30-39.9) 07/13/2017   Borderline diabetes mellitus 07/13/2017   Hypothyroid 01/06/2012   Meniere disease    Arthritis     PCP: Kathyleen Parkins, MD  REFERRING PROVIDER: Albertus Alt, MD  REFERRING DIAG: Other abnormalities of gait and mobility (R26.89);Unsteadiness on feet (R26.81)  THERAPY DIAG:  Impaired functional mobility, balance, gait, and endurance  History of falling  Rationale for Evaluation and Treatment: Rehabilitation  ONSET DATE: Jan/Feb   SUBJECTIVE:                                                                                                                                                                                                         SUBJECTIVE STATEMENT: Pt states she is doing well and feels she's getting better.  No falls, pain or issues.     Evaluation: Unsure of if she needs PT. Reports she was at [facility] in [place] at the beginning of April and she had two different therapists. One told her she didn't need therapy as long as she stayed active and the other referred her for therapy. Reports she was in the hospital for fluid around her heart so they may  want her to work on endurance. Later  she says she was having falls.  Initially says that she doesn't have any pain but later reports that sometimes she has some neck and shoulder pain but it comes and goes. Right now, it isn't that bad. Hand dominance: Left  PERTINENT HISTORY:  3 stents in 2019 Heart Attack in 2021 Breast Cancer 2022  PAIN:  Are you having pain? No  PRECAUTIONS: None  RED FLAGS: None     WEIGHT BEARING RESTRICTIONS: No  FALLS:  Has patient fallen in last 6 months? Yes. Number of falls 4 falls in one day back in Jan or February when she had flu but none prior and none since   LIVING ENVIRONMENT: Lives with: lives with their family Lives with husband and daughter Stairs: Yes: External: 4 in the back and 3 in the front steps; bilateral but cannot reach both Has following equipment at home: Single point cane. Only used SPC after those falls but not prior or since  OCCUPATION: Retired  PLOF: Independent with basic ADLs  PATIENT GOALS: "Improving neck pain and losing weight"  NEXT MD VISIT: 10/27/23   OBJECTIVE:  Note: Objective measures were completed at Evaluation unless otherwise noted.  DIAGNOSTIC FINDINGS:    PATIENT SURVEYS:  Total ABC score: 1440 / 1600 = 90.0 %  COGNITION: Overall cognitive status: Difficulty to assess due to: no family present  SENSATION: WFL  POSTURE: rounded shoulders and forward head  PALPATION:   LOWER EXTREMITY ROM:  Active ROM Right eval Left eval  Hip flexion    Hip extension    Hip abduction    Hip adduction    Hip internal rotation    Hip external rotation    Knee flexion    Knee extension    Ankle dorsiflexion    Ankle plantarflexion    Ankle inversion    Ankle eversion     (Blank rows = not tested)  LOWER EXTREMITY MMT:  MMT Right eval Left eval  Hip flexion 4- 4-  Hip extension 3+ 3+  Hip abduction 3+ 3+  Hip adduction    Hip internal rotation    Hip external rotation    Knee flexion  4- 4-  Knee extension 4- 4-  Ankle dorsiflexion 4+ 4+  Ankle plantarflexion    Ankle inversion    Ankle eversion     (Blank rows = not tested)  LOWER EXTREMITY SPECIAL TESTS:    FUNCTIONAL TESTS:  30 seconds chair stand test: 9.5 STS Timed up and go (TUG): 11 seconds 2 minute walk test: 405' Semi-tandem/tandem: 30", mild swaying t/o SLS: R: 5", L: 4"   GAIT: Distance walked: 50 Assistive device utilized: None Level of assistance: Complete Independence Comments: Mild unsteadiness at times, Dec velocity, forward trunk lean, inc thoracic kyphosis. No overt LOB DGI 1. Gait level surface (3) Normal: Walks 20', no assistive devices, good sped, no evidence for imbalance, normal gait pattern 2. Change in gait speed (2) Mild Impairment: Is able to change speed but demonstrates mild gait deviations, or not gait deviations but unable to achieve a significant change in velocity, or uses an assistive device. 3. Gait with horizontal head turns (1) Moderate Impairment: Performs head turns with moderate change in gait velocity, slows down, staggers but recovers, can continue to walk. 4. Gait with vertical head turns 1) Moderate Impairment: Performs head turns with moderate change in gait velocity, slows down, staggers but recovers, can continue to walk. 5. Gait and pivot turn (2) Mild Impairment: Pivot turns safely in >  3 seconds and stops with no loss of balance. 6. Step over obstacle (2) Mild Impairment: Is able to step over box, but must slow down and adjust steps to clear box safely. 7. Step around obstacles (2) Mild Impairment: Is able to step around both cones, but must slow down and adjust steps to clear cones. 8. Stairs (2) Mild Impairment: Alternating feet, must use rail.  TOTAL SCORE: 15 / 24   TREATMENT DATE:  11/08/23 Nustep seat 7 UE/LE level 3; 5 minutes Standing:  heelraises 20X  Hip abduction 2X10 each with RTB  Hip extension 2X10 each with RTB  Tandem stance 2X30"  each (increase to foam next session)  SLS best time of 3 trials each Rt:20"  Lt:10" max  Lunges onto 4" step no UE 2X10  Vectors 2 sets 5 reps with 1 UE assist 5" holds each position Sit to stands no UE assist 10X   11/03/23 Nustep seat 7 UE/LE level 3 5 minutes Standing:  heelraises 20X  Hip abduction 2X10 each  Hip extension 2X10 each  Tandem stance 2X30" each  SLS best time of 3 trials each Rt:9"  Lt:10" max  Lunges onto 4" step no UE 2X10 Sit to stands no UE assist 10X    10/24/23:  MMT  DGI  30 sec chair rise test  TUG  HEP given   10/20/23:  PT Evaluation                                                                                                                             PATIENT EDUCATION:  Education details: PT evaluation, objective findings, POC, Importance of HEP, Precautions, Clinic policies  Person educated: Patient Education method: Explanation and Demonstration Education comprehension: verbalized understanding and returned demonstration  HOME EXERCISE PROGRAM: Access Code: [email or web address] URL: [email or web address] Date: 10/24/2023 Prepared by: Virgia Griffins Powell-Butler  Exercises - Standing Single Leg Stance with Counter Support  - 2 x daily - 7 x weekly - 3 sets - 30 hold - Standing Hip Extension with Counter Support  - 2 x daily - 7 x weekly - 2 sets - 10 reps - Standing Hip Abduction with Counter Support  - 2 x daily - 7 x weekly - 2 sets - 10 reps  ASSESSMENT:  CLINICAL IMPRESSION: Began session with nustep and progressed to standing strengthening/stabilization exercises today.  Added red theraband to hip strengthening exercises with cues for form and stabilization. Improved single leg stance time today and no difficulty maintaining full 30" holds with tandem.   Added vectors to help further improve SLS ability.  Less incidences of use of momentum with hip exercises.  Pt did not require any seated rests or breaks during session; no indication  of fatigue.  Pt will continue to benefit from skilled therapy.    Patient is a 78 y.o. female who was seen today for physical therapy evaluation and treatment for Other abnormalities of gait and mobility (R26.89);Unsteadiness on feet (  R26.81). Patient demonstrated poor posture, the above gait deviations, decreased LE strength, impaired single leg balance, and impaired balance with multiple aspects during DGI on this date which could all lead to increased risk of falls. Patient reported previous falls at the beginning of April when she had the flu but none prior or since. Patient will benefit from continued skilled physical therapy in order to address the above in order to reeduce risk of falls and improve function/QOL.    OBJECTIVE IMPAIRMENTS: Abnormal gait and postural dysfunction.   ACTIVITY LIMITATIONS: pt reports no limitations   PARTICIPATION LIMITATIONS: pt reports no limitations  REHAB POTENTIAL: Good  CLINICAL DECISION MAKING: Stable/uncomplicated  EVALUATION COMPLEXITY: Low   GOALS: Goals reviewed with patient? No  SHORT TERM GOALS: Target date: 11/03/23 Patient will be independent with performance of HEP to demonstrate adequate self management of symptoms.  Baseline:  Goal status: INITIAL  2.   Patient will report at least a 25% improvement with function or pain overall since beginning PT. Baseline:  Goal status: INITIAL   LONG TERM GOALS: Target date: 12/01/23  Patient will improve ABS Scale score by at least 5% to demonstrate improved perceived function with balance.  Baseline: Goal status: INITIAL  Patient will score at least a  4- on bilat. hip ext and abd MMT to show increased LE strength and/or power and improve ambulation/gait mechanics.  Baseline:  Goal status: INITIAL 3.   Patient will be able to maintain SL balance for at least 10" bilaterally in order to improve function with everyday task such as stair navigation. Baseline: Goal status: INITIAL 4.  Patient will improve DGI score by at least 20/24 in order to demonstrate Improved dynamic balance for decreased risk of falls.  Baseline: Goal status: INITIAL  PLAN:  PT FREQUENCY: 1-2x/week  PT DURATION: 6 weeks  PLANNED INTERVENTIONS: 97164- PT Re-evaluation, 97110-Therapeutic exercises, 97530- Therapeutic activity, 97112- Neuromuscular re-education, 97535- Self Care, 40981- Manual therapy, 97012- Traction (mechanical), Patient/Family education, Dry Needling, Joint mobilization, Spinal mobilization, Scar mobilization, DME instructions, and Moist heat  PLAN FOR NEXT SESSION:  Next session add dynamic balance activities.  Add foam to static balance challenges.  Continue to progress LE strengthening.   Lorenso Romance, PTA/CLT [facility] Ph: [phone]  7:33 AM, 11/08/23

## 2023-11-09 ENCOUNTER — Other Ambulatory Visit (HOSPITAL_COMMUNITY): Payer: Self-pay

## 2023-11-09 ENCOUNTER — Encounter: Payer: Self-pay | Admitting: Nurse Practitioner

## 2023-11-09 ENCOUNTER — Ambulatory Visit: Attending: Nurse Practitioner | Admitting: Nurse Practitioner

## 2023-11-09 ENCOUNTER — Telehealth: Payer: Self-pay | Admitting: Pharmacy Technician

## 2023-11-09 VITALS — BP 116/60 | HR 62 | Ht 62.0 in | Wt 196.0 lb

## 2023-11-09 DIAGNOSIS — I2510 Atherosclerotic heart disease of native coronary artery without angina pectoris: Secondary | ICD-10-CM

## 2023-11-09 DIAGNOSIS — I5033 Acute on chronic diastolic (congestive) heart failure: Secondary | ICD-10-CM

## 2023-11-09 DIAGNOSIS — I5032 Chronic diastolic (congestive) heart failure: Secondary | ICD-10-CM

## 2023-11-09 DIAGNOSIS — E785 Hyperlipidemia, unspecified: Secondary | ICD-10-CM

## 2023-11-09 DIAGNOSIS — I10 Essential (primary) hypertension: Secondary | ICD-10-CM | POA: Diagnosis not present

## 2023-11-09 MED ORDER — EMPAGLIFLOZIN 10 MG PO TABS: 10.0000 mg | ORAL_TABLET | Freq: Every day | ORAL | 11 refills | Status: DC

## 2023-11-09 MED ORDER — POTASSIUM CHLORIDE CRYS ER 20 MEQ PO TBCR: 20.0000 meq | EXTENDED_RELEASE_TABLET | Freq: Every day | ORAL | 4 refills | Status: AC

## 2023-11-09 NOTE — Telephone Encounter (Signed)
 Patient Advocate Encounter   The patient was approved for a [employer] grant that will help cover the cost of Jardiance  Total amount awarded, 4500.00.  Effective: 10/10/23 - 10/08/24   NFA:[id number] VHQ:[id number] [id number]  [employer] ID: [id number]   Pharmacy provided with approval and processing information. Patient informed via mychart

## 2023-11-09 NOTE — Progress Notes (Signed)
 Cardiology Office Note:  .   Date: 11/09/2023 ID:  Gloria Mccoy, DOB 11-Sep-1945, MRN [id number] PCP: Kathyleen Parkins, MD  [facility] Providers Cardiologist:  Armida Lander, MD    History of Present Illness: .   Gloria Mccoy is a 78 y.o. female with a PMH of CAD, s/p CABG in 2019, HLD, hypertension, history of breast cancer, who presents today for hospital follow-up.  Last seen by Dr. Armida Lander on September 15, 2023.  Blood pressure was above goal and losartan  increased to 25 mg daily, otherwise she was doing well.  Hospitalized in April 2025 due to acute on chronic diastolic CHF.  Reported worsening dyspnea for the last 4 days.  She had positive chest tightness.  Prior to hospital stay, she was treated for allergies with no improvement in her symptoms.  Echo revealed normal LVEF with moderate asymmetric LVH of basal segment, RV not well-visualized, trivial pericardial effusion, trivial MR and TR.  Received IV Lasix  for diuresis.  SGLT2 inhibitor was added to medication regimen.  Received electrolyte supplements.  Today she presents for hospital follow-up.  She states she is doing well. Weights are stable per her report. Denies any chest pain, shortness of breath, palpitations, syncope, presyncope, dizziness, orthopnea, PND, swelling or significant weight changes, acute bleeding, or claudication.  ROS: Negative. See HPI.   Studies Reviewed: Aaron Aas    EKG: EKG is not ordered today.   Echo 09/2023: 1. Left ventricular ejection fraction, by estimation, is 70 to 75%. The  left ventricle has hyperdynamic function. The left ventricle has no  regional wall motion abnormalities. There is moderate asymmetric left  ventricular hypertrophy of the basal-septal   segment. Left ventricular diastolic parameters are indeterminate.   2. Right ventricular systolic function was not well visualized. The right  ventricular size is not well visualized. Tricuspid regurgitation signal is   inadequate for assessing PA pressure.   3. The mitral valve is normal in structure. Trivial mitral valve  regurgitation. No evidence of mitral stenosis.   4. The aortic valve was not well visualized. Aortic valve regurgitation  is not visualized. No aortic stenosis is present.   5. The inferior vena cava is normal in size with greater than 50%  respiratory variability, suggesting right atrial pressure of 3 mmHg.  LHC 10/2017:  Ost 1st Diag to 1st Diag lesion is 40% stenosed. Ost Cx lesion is 25% stenosed. Ost Ramus lesion is 20% stenosed. Ost LAD lesion is 45% stenosed. Prox LAD to Mid LAD lesion is 80% stenosed. Origin lesion is 100% stenosed. Origin lesion is 99% stenosed. Origin to Dist Graft lesion is 99% stenosed. Dist Graft lesion is 100% stenosed. LV end diastolic pressure is mildly elevated. The left ventricular systolic function is normal.   Hyperdynamic LV function with an EF of 65% without focal segmental wall motion abnormalities.   Native coronary obstructive disease with 40% ostial LAD stenosis, diffuse 40 to 50% proximal diagonal stenosis with 80% LAD stenosis diffusely after the diagonal takeoff and evidence for competitive filling to the mid LAD via the LIMA graft; 25% ostial smooth narrowing in the ramus intermediate vessel; 25% ostial smooth narrowing in the left circumflex vessel; normal RCA.   Widely patent LIMA graft which supplies the mid LAD.   Totally ostial occlusion of the SVG which had supplied the ramus intermediate vessel.   Subtotally occluded ostial vein graft with diffuse 99% narrowing insistent with an atretic graft  which does not fill the diagonal vessel.  RECOMMENDATION: Medical therapy.  I suspect the early vein graft occlusion was contributed by improvement in the prior pre-CABG stenoses.  Physical Exam:   VS:  BP 116/60 (BP Location: Left Arm, Cuff Size: Large)   Pulse 62   Ht 5\' 2"  (1.575 m)   Wt 196 lb (88.9 kg)   SpO2 98%   BMI  35.85 kg/m    Wt Readings from Last 3 Encounters:  11/09/23 196 lb (88.9 kg)  09/29/23 192 lb 8 oz (87.3 kg)  09/23/23 197 lb 8.5 oz (89.6 kg)    GEN: Well nourished, well developed in no acute distress NECK: No JVD; No carotid bruits CARDIAC: S1/S2, RRR, no murmurs, rubs, gallops RESPIRATORY:  Clear to auscultation without rales, wheezing or rhonchi  ABDOMEN: Soft, non-tender, non-distended EXTREMITIES:  No edema; No deformity   ASSESSMENT AND PLAN: .    HFpEF Stage C, NYHA class I-II symptoms.  EF 70 to 75% in April 2025. Diastolic parameters are indeterminate. Euvolemic and well compensated on exam.  Will provide refills of Jardiance  as she ran out of this medicine and will help with patient assistance.  She also ran out of her potassium supplement and we will refill this today.  Will check BMET in 1 week.  No other medication changes at this time. Low sodium diet, fluid restriction <2L, and daily weights encouraged. Educated to contact our office for weight gain of 2 lbs overnight or 5 lbs in one week.  CAD, s/p CABG Stable with no anginal symptoms. No indication for ischemic evaluation.  Continue aspirin , atorvastatin , losartan , Lopressor , and nitroglycerin  as needed. Heart healthy diet and regular cardiovascular exercise encouraged.   HLD LDL in August 2024 was 37.  She is currently at goal.  No medication changes at this time. Heart healthy diet and regular cardiovascular exercise encouraged. Continue to follow with PCP.  HTN Blood pressure stable. Discussed to monitor BP at home at least 2 hours after medications and sitting for 5-10 minutes.  No medication changes at this time.  Will repeat BMET as noted above. Heart healthy diet and regular cardiovascular exercise encouraged.    Dispo: Follow-up with MD/APP in 2 to 3 months or sooner if any changes.  Signed, Lasalle Pointer, NP

## 2023-11-09 NOTE — Patient Instructions (Addendum)
 Medication Instructions:  Your physician recommends that you continue on your current medications as directed. Please refer to the Current Medication list given to you today. A Message has been sent to our Med Assist team to help get you assistance with Jardiance    Labwork: In 1-2 weeks at [facility]   Testing/Procedures: None   Follow-Up: Your physician recommends that you schedule a follow-up appointment in: 2-3 months   Any Other Special Instructions Will Be Listed Below (If Applicable).  HEART FAILURE INSTRUCTION SHEET  Follow a low-salt diet-you are allowed no more than 2,000 mg of sodium per day. Watch your fluid intake. In general, you should not be taking more than 64 ounces a day (no more than 8 glasses per day). Sometimes we refer to this as "2 liters per day." This includes sources of water  in food like soup, coffee, tea, milk etc. Weigh yourself on the same scale at the same time of the day preferably immediately after your first void. Keep a log of your weights. Call your doctor: (Anytime you feel any of the following symptoms)  3 lbs weight gain overnight or 5 lbs within a week Shortness of breath, with or without a day hacking cough Swelling in hands, feet or stomach If you have to sleep on extra pillows at night in order to breathe   IT IS IMPORTANT TO LET YOUR DOCTOR KNOW EARLY ON IF YOU ARE HAVING SYMPTOMS SO WE CAN HELP YOU!   If you need a refill on your cardiac medications before your next appointment, please call your pharmacy.

## 2023-11-15 ENCOUNTER — Ambulatory Visit (HOSPITAL_COMMUNITY): Admitting: Physical Therapy

## 2023-11-15 ENCOUNTER — Other Ambulatory Visit (HOSPITAL_COMMUNITY)
Admission: RE | Admit: 2023-11-15 | Discharge: 2023-11-15 | Disposition: A | Discharge status: Home / Self Care | Source: Ambulatory Visit | Attending: Nurse Practitioner | Admitting: Nurse Practitioner

## 2023-11-15 DIAGNOSIS — I5032 Chronic diastolic (congestive) heart failure: Secondary | ICD-10-CM | POA: Diagnosis not present

## 2023-11-15 DIAGNOSIS — Z9181 History of falling: Secondary | ICD-10-CM

## 2023-11-15 DIAGNOSIS — Z7409 Other reduced mobility: Secondary | ICD-10-CM | POA: Diagnosis not present

## 2023-11-15 LAB — BASIC METABOLIC PANEL WITH GFR
Anion gap: 8 (ref 5–15)
BUN: 15 mg/dL (ref 8–23)
CO2: 27 mmol/L (ref 22–32)
Calcium: 8.5 mg/dL — ABNORMAL LOW (ref 8.9–10.3)
Chloride: 104 mmol/L (ref 98–111)
Creatinine, Ser: 0.74 mg/dL (ref 0.44–1.00)
GFR, Estimated: >60 mL/min (ref >60)
Glucose, Bld: 110 mg/dL — ABNORMAL HIGH (ref 70–99)
Potassium: 4.2 mmol/L (ref 3.5–5.1)
Sodium: 139 mmol/L (ref 135–145)

## 2023-11-15 NOTE — Therapy (Signed)
 OUTPATIENT PHYSICAL THERAPY LOWER EXTREMITY TREATMENT   Patient Name: Gloria Mccoy MRN: [id number] DOB:August 17, 1945, 78 y.o., female Today's Date: 11/15/2023  END OF SESSION:  PT End of Session - 11/15/23 0722     Visit Number 5    Number of Visits 6    Date for PT Re-Evaluation 12/01/23    Authorization Type [employer]    Authorization Time Period Auth requested    Progress Note Due on Visit 6    PT Start Time 0716    PT Stop Time 0758    PT Time Calculation (min) 42 min    Activity Tolerance Patient tolerated treatment well    Behavior During Therapy [facility] for tasks assessed/performed             Past Medical History:  Diagnosis Date   Arthritis    "hands sometimes" (07/13/2017)   Coronary artery disease    a. s/p CABG x3 in 06/2017 with LIMA-LAD, SVG-D1, and SVG-RI.  b. 10/2017: cath showing a widely patent LIMA-LAD with ostial occlusion of the SVG-RI and subtotally occluded atretic SVG-D1. Graft occlusion thought to be 2ry to improvement in pre-CABG stenoses.    Essential hypertension    Family history of adverse reaction to anesthesia    sister had a complication that was stated she had a "foggy" episode after her breast surgery was readmitted 1 day post op after being discharged from the hospital. Sister also has significant lung problems that could have contributed to this    Family history of breast cancer    Family history of colon cancer    Hypothyroid    Meniere disease    Myocardial infarction (HCC) 06/2017   during [facility] rehab   Obesity    Osteopenia 01/2012   T score -1.3 FRAX 7.9%/0.6%   Personal history of chemotherapy 2021   Personal history of radiation therapy 2021   left breast   Past Surgical History:  Procedure Laterality Date   BREAST LUMPECTOMY WITH RADIOACTIVE SEED AND SENTINEL LYMPH NODE BIOPSY Left 09/01/2018   Procedure: LEFT BREAST LUMPECTOMY WITH RADIOACTIVE SEED AND SENTINEL LYMPH NODE BIOPSY;  Surgeon: Caralyn Chandler, MD;   Location: [facility] OR;  Service: General;  Laterality: Left;   BREAST SURGERY Left 2020   INVASIVE DUCTAL CARCINOMA/DUCTAL CARCINOMA IN SITU/MARGINS UNINVOLVED   CARDIAC CATHETERIZATION  07/13/2017   CATARACT EXTRACTION W/PHACO Right 01/28/2015   Procedure: CATARACT EXTRACTION PHACO AND INTRAOCULAR LENS PLACEMENT :  CDE:  5.70;  Surgeon: Albert Huff, MD;  Location: [facility] ORS;  Service: Ophthalmology;  Laterality: Right;   CATARACT EXTRACTION W/PHACO Left 02/11/2015   Procedure: CATARACT EXTRACTION PHACO AND INTRAOCULAR LENS PLACEMENT (IOC);  Surgeon: Albert Huff, MD;  Location: [facility] ORS;  Service: Ophthalmology;  Laterality: Left;  CDE: 7.38   COLONOSCOPY N/A 09/26/2012   Procedure: COLONOSCOPY;  Surgeon: Beau Bound, MD;  Location: [facility] ENDO SUITE;  Service: Gastroenterology;  Laterality: N/A;   CORONARY ARTERY BYPASS GRAFT N/A 07/15/2017   Procedure: CORONARY ARTERY BYPASS GRAFTING (CABG) X 3 USING LEFT INTERNAL MAMMARY ARTERY AND RIGHT SAPHENOUS VEIN- ENDOSCOPICALLY HARVESTED;  Surgeon: Zelphia Higashi, MD;  Location: [facility] OR;  Service: Open Heart Surgery;  Laterality: N/A;   IR IMAGING GUIDED PORT INSERTION  12/29/2018   IR REMOVAL TUN ACCESS W/ PORT W/O FL MOD SED  04/26/2019   LEFT HEART CATH AND CORONARY ANGIOGRAPHY N/A 07/13/2017   Procedure: LEFT HEART CATH AND CORONARY ANGIOGRAPHY;  Surgeon: Swaz[place], Peter M, MD;  Location: [facility]  INVASIVE CV LAB;  Service: Cardiovascular;  Laterality: N/A;   LEFT HEART CATH AND CORS/GRAFTS ANGIOGRAPHY N/A 11/03/2017   Procedure: LEFT HEART CATH AND CORS/GRAFTS ANGIOGRAPHY;  Surgeon: Millicent Ally, MD;  Location: [facility] INVASIVE CV LAB;  Service: Cardiovascular;  Laterality: N/A;   TEE WITHOUT CARDIOVERSION N/A 07/15/2017   Procedure: TRANSESOPHAGEAL ECHOCARDIOGRAM (TEE);  Surgeon: Zelphia Higashi, MD;  Location: [facility] OR;  Service: Open Heart Surgery;  Laterality: N/A;   TUBAL LIGATION     TYMPANOPLASTY Left    fluid from ear drum   Patient Active Problem  List   Diagnosis Date Noted   Acute on chronic diastolic CHF (congestive heart failure) (HCC) 09/21/2023   SIRS (systemic inflammatory response syndrome) (HCC) 09/21/2023   Acute respiratory failure with hypoxia (HCC) 09/21/2023   Hypokalemia 09/21/2023   Chest pain 09/21/2023   Hyperlipidemia 09/21/2023   Port-A-Cath in place 01/16/2019   Genetic testing 08/29/2018   Family history of breast cancer    Family history of colon cancer    Malignant neoplasm of lower-outer quadrant of left breast of female, estrogen receptor negative (HCC) 08/16/2018   NSTEMI (non-ST elevated myocardial infarction) (HCC) 11/02/2017   S/P CABG x 3 07/15/2017   CAD S/P percutaneous coronary angioplasty 07/15/2017   Unstable angina (HCC) 07/13/2017   Essential hypertension 07/13/2017   Obesity (BMI 30-39.9) 07/13/2017   Borderline diabetes mellitus 07/13/2017   Hypothyroid 01/06/2012   Meniere disease    Arthritis     PCP: Kathyleen Parkins, MD  REFERRING PROVIDER: Albertus Alt, MD  REFERRING DIAG: Other abnormalities of gait and mobility (R26.89);Unsteadiness on feet (R26.81)  THERAPY DIAG:  Impaired functional mobility, balance, gait, and endurance  History of falling  Rationale for Evaluation and Treatment: Rehabilitation  ONSET DATE: Jan/Feb   SUBJECTIVE:                                                                                                                                                                                                         SUBJECTIVE STATEMENT: Pt states she is about the same as far as arthritis pain.  Feels a little more stable than she was previously.  No falls, pain or issues.     Evaluation: Unsure of if she needs PT. Reports she was at [facility] in [place] at the beginning of April and she had two different therapists. One told her she didn't need therapy as long as she stayed active and the other referred her for therapy. Reports she was in  the hospital for fluid around her  heart so they may want her to work on endurance. Later she says she was having falls.  Initially says that she doesn't have any pain but later reports that sometimes she has some neck and shoulder pain but it comes and goes. Right now, it isn't that bad. Hand dominance: Left  PERTINENT HISTORY:  3 stents in 2019 Heart Attack in 2021 Breast Cancer 2022  PAIN:  Are you having pain? No  PRECAUTIONS: None  RED FLAGS: None     WEIGHT BEARING RESTRICTIONS: No  FALLS:  Has patient fallen in last 6 months? Yes. Number of falls 4 falls in one day back in Jan or February when she had flu but none prior and none since   LIVING ENVIRONMENT: Lives with: lives with their family Lives with husband and daughter Stairs: Yes: External: 4 in the back and 3 in the front steps; bilateral but cannot reach both Has following equipment at home: Single point cane. Only used SPC after those falls but not prior or since  OCCUPATION: Retired  PLOF: Independent with basic ADLs  PATIENT GOALS: "Improving neck pain and losing weight"  NEXT MD VISIT: 10/27/23   OBJECTIVE:  Note: Objective measures were completed at Evaluation unless otherwise noted.  DIAGNOSTIC FINDINGS:    PATIENT SURVEYS:  Total ABC score: 1440 / 1600 = 90.0 %  COGNITION: Overall cognitive status: Difficulty to assess due to: no family present  SENSATION: WFL  POSTURE: rounded shoulders and forward head  PALPATION:   LOWER EXTREMITY ROM:  Active ROM Right eval Left eval  Hip flexion    Hip extension    Hip abduction    Hip adduction    Hip internal rotation    Hip external rotation    Knee flexion    Knee extension    Ankle dorsiflexion    Ankle plantarflexion    Ankle inversion    Ankle eversion     (Blank rows = not tested)  LOWER EXTREMITY MMT:  MMT Right eval Left eval  Hip flexion 4- 4-  Hip extension 3+ 3+  Hip abduction 3+ 3+  Hip adduction    Hip internal  rotation    Hip external rotation    Knee flexion 4- 4-  Knee extension 4- 4-  Ankle dorsiflexion 4+ 4+  Ankle plantarflexion    Ankle inversion    Ankle eversion     (Blank rows = not tested)  LOWER EXTREMITY SPECIAL TESTS:    FUNCTIONAL TESTS:  30 seconds chair stand test: 9.5 STS Timed up and go (TUG): 11 seconds 2 minute walk test: 405' Semi-tandem/tandem: 30", mild swaying t/o SLS: R: 5", L: 4"   GAIT: Distance walked: 50 Assistive device utilized: None Level of assistance: Complete Independence Comments: Mild unsteadiness at times, Dec velocity, forward trunk lean, inc thoracic kyphosis. No overt LOB DGI 1. Gait level surface (3) Normal: Walks 20', no assistive devices, good sped, no evidence for imbalance, normal gait pattern 2. Change in gait speed (2) Mild Impairment: Is able to change speed but demonstrates mild gait deviations, or not gait deviations but unable to achieve a significant change in velocity, or uses an assistive device. 3. Gait with horizontal head turns (1) Moderate Impairment: Performs head turns with moderate change in gait velocity, slows down, staggers but recovers, can continue to walk. 4. Gait with vertical head turns 1) Moderate Impairment: Performs head turns with moderate change in gait velocity, slows down, staggers but recovers, can continue to walk. 5.  Gait and pivot turn (2) Mild Impairment: Pivot turns safely in > 3 seconds and stops with no loss of balance. 6. Step over obstacle (2) Mild Impairment: Is able to step over box, but must slow down and adjust steps to clear box safely. 7. Step around obstacles (2) Mild Impairment: Is able to step around both cones, but must slow down and adjust steps to clear cones. 8. Stairs (2) Mild Impairment: Alternating feet, must use rail.  TOTAL SCORE: 15 / 24   TREATMENT DATE:  11/15/23 Nustep seat 7 UE/LE level 3; 5 minutes Standing:  heelraises 20X  Hip abduction 2X10 each with RTB  Hip  extension 2X10 each with RTB  Lateral stepping RTB 17ft line 2RT  Dynamic balance challenge tandem gait on 19ft line 2RT  Tandem stance 2X30" each on foam  SLS on blue foam best time of 2X30" each, 2-3" hold max without UE  Lunges onto BOSU no UE 2X10  Vectors 2 sets 5 reps with 1 UE assist 5" holds each position    11/08/23 Nustep seat 7 UE/LE level 3; 5 minutes Standing:  heelraises 20X  Hip abduction 2X10 each with RTB  Hip extension 2X10 each with RTB  Tandem stance 2X30" each (increase to foam next session)  SLS best time of 3 trials each Rt:20"  Lt:10" max  Lunges onto 4" step no UE 2X10  Vectors 2 sets 5 reps with 1 UE assist 5" holds each position Sit to stands no UE assist 10X   11/03/23 Nustep seat 7 UE/LE level 3 5 minutes Standing:  heelraises 20X  Hip abduction 2X10 each  Hip extension 2X10 each  Tandem stance 2X30" each  SLS best time of 3 trials each Rt:9"  Lt:10" max  Lunges onto 4" step no UE 2X10 Sit to stands no UE assist 10X    10/24/23:  MMT  DGI  30 sec chair rise test  TUG  HEP given   10/20/23:  PT Evaluation                                                                                                                             PATIENT EDUCATION:  Education details: PT evaluation, objective findings, POC, Importance of HEP, Precautions, Clinic policies  Person educated: Patient Education method: Explanation and Demonstration Education comprehension: verbalized understanding and returned demonstration  HOME EXERCISE PROGRAM: Access Code: [email or web address] URL: [email or web address] Date: 10/24/2023 Prepared by: Virgia Griffins Powell-Butler  Exercises - Standing Single Leg Stance with Counter Support  - 2 x daily - 7 x weekly - 3 sets - 30 hold - Standing Hip Extension with Counter Support  - 2 x daily - 7 x weekly - 2 sets - 10 reps - Standing Hip Abduction with Counter Support  - 2 x daily - 7 x weekly - 2 sets - 10  reps  ASSESSMENT:  CLINICAL IMPRESSION: Continued focus on improving LE strengthening/stabilization.  Red theraband also added to  side stepping today outside of bars and foam added to single leg and tandem stance for increased challenge.  Pt unable to maintain stability on foam with either stance greater than 5 seconds without use of UE. Rt leading a little easier than Lt with tandem. Rt side stepping more challenging for stability than Lt.  Pt surprisingly completed tandem gait with good stability and minimal, self corrected loss of balance.  BOSU added for lunges for more dynamic surface and ability to complete without UE assist.  Continued with vectors to help further improve SLS ability.  Pt did not require any seated rests or breaks during session; no indication of fatigue.  Pt will continue to benefit from skilled therapy.    Patient is a 78 y.o. female who was seen today for physical therapy evaluation and treatment for Other abnormalities of gait and mobility (R26.89);Unsteadiness on feet (R26.81). Patient demonstrated poor posture, the above gait deviations, decreased LE strength, impaired single leg balance, and impaired balance with multiple aspects during DGI on this date which could all lead to increased risk of falls. Patient reported previous falls at the beginning of April when she had the flu but none prior or since. Patient will benefit from continued skilled physical therapy in order to address the above in order to reeduce risk of falls and improve function/QOL.    OBJECTIVE IMPAIRMENTS: Abnormal gait and postural dysfunction.   ACTIVITY LIMITATIONS: pt reports no limitations   PARTICIPATION LIMITATIONS: pt reports no limitations  REHAB POTENTIAL: Good  CLINICAL DECISION MAKING: Stable/uncomplicated  EVALUATION COMPLEXITY: Low   GOALS: Goals reviewed with patient? No  SHORT TERM GOALS: Target date: 11/03/23 Patient will be independent with performance of HEP to  demonstrate adequate self management of symptoms.  Baseline:  Goal status: INITIAL  2.   Patient will report at least a 25% improvement with function or pain overall since beginning PT. Baseline:  Goal status: INITIAL   LONG TERM GOALS: Target date: 12/01/23  Patient will improve ABS Scale score by at least 5% to demonstrate improved perceived function with balance.  Baseline: Goal status: INITIAL  Patient will score at least a  4- on bilat. hip ext and abd MMT to show increased LE strength and/or power and improve ambulation/gait mechanics.  Baseline:  Goal status: INITIAL 3.   Patient will be able to maintain SL balance for at least 10" bilaterally in order to improve function with everyday task such as stair navigation. Baseline: Goal status: INITIAL 4. Patient will improve DGI score by at least 20/24 in order to demonstrate Improved dynamic balance for decreased risk of falls.  Baseline: Goal status: INITIAL  PLAN:  PT FREQUENCY: 1-2x/week  PT DURATION: 6 weeks  PLANNED INTERVENTIONS: 97164- PT Re-evaluation, 97110-Therapeutic exercises, 97530- Therapeutic activity, 97112- Neuromuscular re-education, 97535- Self Care, [id number]- Manual therapy, 97012- Traction (mechanical), Patient/Family education, Dry Needling, Joint mobilization, Spinal mobilization, Scar mobilization, DME instructions, and Moist heat  PLAN FOR NEXT SESSION:  Continue to progress LE strengthening and balance.   Lorenso Romance, PTA/CLT [facility] Ph: [phone]  7:23 AM, 11/15/23

## 2023-11-18 ENCOUNTER — Ambulatory Visit: Payer: Self-pay | Admitting: Nurse Practitioner

## 2023-11-23 ENCOUNTER — Ambulatory Visit (HOSPITAL_COMMUNITY): Attending: Internal Medicine | Admitting: Physical Therapy

## 2023-11-23 DIAGNOSIS — Z7409 Other reduced mobility: Secondary | ICD-10-CM | POA: Diagnosis not present

## 2023-11-23 DIAGNOSIS — Z9181 History of falling: Secondary | ICD-10-CM | POA: Diagnosis not present

## 2023-11-23 NOTE — Therapy (Addendum)
 OUTPATIENT PHYSICAL THERAPY LOWER EXTREMITY TREATMENT   Patient Name: Gloria Mccoy MRN: [id number] DOB:[birth year], 78 y.o., female Today's Date: 11/23/2023  PHYSICAL THERAPY DISCHARGE SUMMARY  Visits from Start of Care: 6  Current functional level related to goals / functional outcomes: See below   Remaining deficits: See below   Education / Equipment: See below   Patient agrees to discharge. Patient goals were met. Patient is being discharged due to meeting the stated rehab goals.   END OF SESSION:  PT End of Session - 11/23/23 0937     Visit Number 6    Number of Visits 6    Date for PT Re-Evaluation 12/01/23    Authorization Type [employer]    Authorization Time Period Auth requested    Progress Note Due on Visit 6    Activity Tolerance Patient tolerated treatment well    Behavior During Therapy [facility] for tasks assessed/performed              Past Medical History:  Diagnosis Date   Arthritis    "hands sometimes" (07/13/2017)   Coronary artery disease    a. s/p CABG x3 in 06/2017 with LIMA-LAD, SVG-D1, and SVG-RI.  b. 10/2017: cath showing a widely patent LIMA-LAD with ostial occlusion of the SVG-RI and subtotally occluded atretic SVG-D1. Graft occlusion thought to be 2ry to improvement in pre-CABG stenoses.    Essential hypertension    Family history of adverse reaction to anesthesia    sister had a complication that was stated she had a "foggy" episode after her breast surgery was readmitted 1 day post op after being discharged from the hospital. Sister also has significant lung problems that could have contributed to this    Family history of breast cancer    Family history of colon cancer    Hypothyroid    Meniere disease    Myocardial infarction (HCC) 06/2017   during cardaic rehab   Obesity    Osteopenia 01/2012   T score -1.3 FRAX 7.9%/0.6%   Personal history of chemotherapy 2021   Personal history of radiation therapy 2021   left breast    Past Surgical History:  Procedure Laterality Date   BREAST LUMPECTOMY WITH RADIOACTIVE SEED AND SENTINEL LYMPH NODE BIOPSY Left 09/01/2018   Procedure: LEFT BREAST LUMPECTOMY WITH RADIOACTIVE SEED AND SENTINEL LYMPH NODE BIOPSY;  Surgeon: Caralyn Chandler, MD;  Location: [facility] OR;  Service: General;  Laterality: Left;   BREAST SURGERY Left 2020   INVASIVE DUCTAL CARCINOMA/DUCTAL CARCINOMA IN SITU/MARGINS UNINVOLVED   CARDIAC CATHETERIZATION  07/13/2017   CATARACT EXTRACTION W/PHACO Right 01/28/2015   Procedure: CATARACT EXTRACTION PHACO AND INTRAOCULAR LENS PLACEMENT :  CDE:  5.70;  Surgeon: Albert Huff, MD;  Location: [facility] ORS;  Service: Ophthalmology;  Laterality: Right;   CATARACT EXTRACTION W/PHACO Left 02/11/2015   Procedure: CATARACT EXTRACTION PHACO AND INTRAOCULAR LENS PLACEMENT (IOC);  Surgeon: Albert Huff, MD;  Location: [facility] ORS;  Service: Ophthalmology;  Laterality: Left;  CDE: 7.38   COLONOSCOPY N/A 09/26/2012   Procedure: COLONOSCOPY;  Surgeon: Beau Bound, MD;  Location: [facility] ENDO SUITE;  Service: Gastroenterology;  Laterality: N/A;   CORONARY ARTERY BYPASS GRAFT N/A 07/15/2017   Procedure: CORONARY ARTERY BYPASS GRAFTING (CABG) X 3 USING LEFT INTERNAL MAMMARY ARTERY AND RIGHT SAPHENOUS VEIN- ENDOSCOPICALLY HARVESTED;  Surgeon: Zelphia Higashi, MD;  Location: [facility] OR;  Service: Open Heart Surgery;  Laterality: N/A;   IR IMAGING GUIDED PORT INSERTION  12/29/2018   IR REMOVAL TUN  ACCESS W/ PORT W/O FL MOD SED  04/26/2019   LEFT HEART CATH AND CORONARY ANGIOGRAPHY N/A 07/13/2017   Procedure: LEFT HEART CATH AND CORONARY ANGIOGRAPHY;  Surgeon: Swaz[place], Peter M, MD;  Location: [facility] INVASIVE CV LAB;  Service: Cardiovascular;  Laterality: N/A;   LEFT HEART CATH AND CORS/GRAFTS ANGIOGRAPHY N/A 11/03/2017   Procedure: LEFT HEART CATH AND CORS/GRAFTS ANGIOGRAPHY;  Surgeon: Millicent Ally, MD;  Location: [facility] INVASIVE CV LAB;  Service: Cardiovascular;  Laterality: N/A;   TEE WITHOUT  CARDIOVERSION N/A 07/15/2017   Procedure: TRANSESOPHAGEAL ECHOCARDIOGRAM (TEE);  Surgeon: Zelphia Higashi, MD;  Location: [facility] OR;  Service: Open Heart Surgery;  Laterality: N/A;   TUBAL LIGATION     TYMPANOPLASTY Left    fluid from ear drum   Patient Active Problem List   Diagnosis Date Noted   Acute on chronic diastolic CHF (congestive heart failure) (HCC) 09/21/2023   SIRS (systemic inflammatory response syndrome) (HCC) 09/21/2023   Acute respiratory failure with hypoxia (HCC) 09/21/2023   Hypokalemia 09/21/2023   Chest pain 09/21/2023   Hyperlipidemia 09/21/2023   Port-A-Cath in place 01/16/2019   Genetic testing 08/29/2018   Family history of breast cancer    Family history of colon cancer    Malignant neoplasm of lower-outer quadrant of left breast of female, estrogen receptor negative (HCC) 08/16/2018   NSTEMI (non-ST elevated myocardial infarction) (HCC) 11/02/2017   S/P CABG x 3 07/15/2017   CAD S/P percutaneous coronary angioplasty 07/15/2017   Unstable angina (HCC) 07/13/2017   Essential hypertension 07/13/2017   Obesity (BMI 30-39.9) 07/13/2017   Borderline diabetes mellitus 07/13/2017   Hypothyroid 01/06/2012   Meniere disease    Arthritis     PCP: Kathyleen Parkins, MD  REFERRING PROVIDER: Albertus Alt, MD  REFERRING DIAG: Other abnormalities of gait and mobility (R26.89);Unsteadiness on feet (R26.81)  THERAPY DIAG:  No diagnosis found.  Rationale for Evaluation and Treatment: Rehabilitation  ONSET DATE: Jan/Feb   SUBJECTIVE:                                                                                                                                                                                                         SUBJECTIVE STATEMENT: Pt states she feels 95% improved.  Every now and then has some twinges of pain but overall much improved and functioning normally. States she feels she is ready for discharge.     Evaluation: Unsure  of if she needs PT. Reports she was at [facility] in [place] at the beginning of April and she had two  different therapists. One told her she didn't need therapy as long as she stayed active and the other referred her for therapy. Reports she was in the hospital for fluid around her heart so they may want her to work on endurance. Later she says she was having falls.  Initially says that she doesn't have any pain but later reports that sometimes she has some neck and shoulder pain but it comes and goes. Right now, it isn't that bad. Hand dominance: Left  PERTINENT HISTORY:  3 stents in 2019 Heart Attack in 2021 Breast Cancer 2022  PAIN:  Are you having pain? No  PRECAUTIONS: None  RED FLAGS: None     WEIGHT BEARING RESTRICTIONS: No  FALLS:  Has patient fallen in last 6 months? Yes. Number of falls 4 falls in one day back in Jan or February when she had flu but none prior and none since   LIVING ENVIRONMENT: Lives with: lives with their family Lives with husband and daughter Stairs: Yes: External: 4 in the back and 3 in the front steps; bilateral but cannot reach both Has following equipment at home: Single point cane. Only used SPC after those falls but not prior or since  OCCUPATION: Retired  PLOF: Independent with basic ADLs  PATIENT GOALS: "Improving neck pain and losing weight"  NEXT MD VISIT: 10/27/23   OBJECTIVE:  Note: Objective measures were completed at Evaluation unless otherwise noted.  DIAGNOSTIC FINDINGS:    PATIENT SURVEYS:  Total ABC score: 1440 / 1600 = 90.0 %  COGNITION: Overall cognitive status: Difficulty to assess due to: no family present  SENSATION: WFL  POSTURE: rounded shoulders and forward head  PALPATION:   LOWER EXTREMITY ROM:  Active ROM Right eval Left eval  Hip flexion    Hip extension    Hip abduction    Hip adduction    Hip internal rotation    Hip external rotation    Knee flexion    Knee extension    Ankle  dorsiflexion    Ankle plantarflexion    Ankle inversion    Ankle eversion     (Blank rows = not tested)  LOWER EXTREMITY MMT:  MMT Right eval Left eval Right 11/23/23 Left 11/23/23  Hip flexion 4- 4- 4+ 4+  Hip extension 3+ 3+ 4- 4+  Hip abduction 3+ 3+ 4+ 4+  Hip adduction      Hip internal rotation      Hip external rotation      Knee flexion 4- 4- 5 4+  Knee extension 4- 4- 5 5  Ankle dorsiflexion 4+ 4+ 5 5  Ankle plantarflexion      Ankle inversion      Ankle eversion       (Blank rows = not tested)  LOWER EXTREMITY SPECIAL TESTS:    FUNCTIONAL TESTS:  30 seconds chair stand test: 9.5 STS Timed up and go (TUG): 11 seconds 2 minute walk test: 405' Semi-tandem/tandem: 30", mild swaying t/o SLS: R: 5", L: 4"   GAIT: Distance walked: 50 Assistive device utilized: None Level of assistance: Complete Independence Comments: Mild unsteadiness at times, Dec velocity, forward trunk lean, inc thoracic kyphosis. No overt LOB DGI 1. Gait level surface (3) Normal: Walks 20', no assistive devices, good sped, no evidence for imbalance, normal gait pattern 2. Change in gait speed (2) Mild Impairment: Is able to change speed but demonstrates mild gait deviations, or not gait deviations but unable to achieve a significant change in velocity,  or uses an assistive device. 3. Gait with horizontal head turns (1) Moderate Impairment: Performs head turns with moderate change in gait velocity, slows down, staggers but recovers, can continue to walk. 4. Gait with vertical head turns 1) Moderate Impairment: Performs head turns with moderate change in gait velocity, slows down, staggers but recovers, can continue to walk. 5. Gait and pivot turn (2) Mild Impairment: Pivot turns safely in > 3 seconds and stops with no loss of balance. 6. Step over obstacle (2) Mild Impairment: Is able to step over box, but must slow down and adjust steps to clear box safely. 7. Step around obstacles (2) Mild  Impairment: Is able to step around both cones, but must slow down and adjust steps to clear cones. 8. Stairs (2) Mild Impairment: Alternating feet, must use rail.  TOTAL SCORE: 15 / 24   TREATMENT DATE:  11/23/23 Timed up and go (TUG): 9.1 seconds (was 11 seconds) SLS: Rt: 14", Lt: 10" (was R: 5", L: 4") ABC test:  1530/1600=95.6%    (was 1440 / 1600 = 90.0 % at evaluation)  DGI 1. Gait level surface (3) Normal: Walks 20', no assistive devices, good sped, no evidence for imbalance, normal gait pattern 2. Change in gait speed (3) Normal: Able to smoothly change walking speed without loss of balance or gait deviation. Shows a significant difference in walking speeds between normal, fast and slow speeds. 3. Gait with horizontal head turns (2) Mild Impairment: Performs head turns smoothly with slight change in gait velocity, i.e., minor disruption to smooth gait path or uses walking aid. 4. Gait with vertical head turns (2) Mild Impairment: Performs head turns smoothly with slight change in gait velocity, i.e., minor disruption to smooth gait path or uses walking aid. 5. Gait and pivot turn (3) Normal: Pivot turns safely within 3 seconds and stops quickly with no loss of balance. 6. Step over obstacle (2) Mild Impairment: Is able to step over box, but must slow down and adjust steps to clear box safely. 7. Step around obstacles (3) Normal: Is able to walk around cones safely without changing gait speed; no evidence of imbalance. 8. Stairs (2) Mild Impairment: Alternating feet, must use rail.  TOTAL SCORE: 20 / 24  (was 15/24)     11/15/23 Nustep seat 7 UE/LE level 3; 5 minutes Standing:  heelraises 20X  Hip abduction 2X10 each with RTB  Hip extension 2X10 each with RTB  Lateral stepping RTB 51ft line 2RT  Dynamic balance challenge tandem gait on 85ft line 2RT  Tandem stance 2X30" each on foam  SLS on blue foam best time of 2X30" each, 2-3" hold max without UE  Lunges onto BOSU  no UE 2X10  Vectors 2 sets 5 reps with 1 UE assist 5" holds each position    11/08/23 Nustep seat 7 UE/LE level 3; 5 minutes Standing:  heelraises 20X  Hip abduction 2X10 each with RTB  Hip extension 2X10 each with RTB  Tandem stance 2X30" each (increase to foam next session)  SLS best time of 3 trials each Rt:20"  Lt:10" max  Lunges onto 4" step no UE 2X10  Vectors 2 sets 5 reps with 1 UE assist 5" holds each position Sit to stands no UE assist 10X   11/03/23 Nustep seat 7 UE/LE level 3 5 minutes Standing:  heelraises 20X  Hip abduction 2X10 each  Hip extension 2X10 each  Tandem stance 2X30" each  SLS best time of 3 trials each Rt:9"  Lt:10" max  Lunges onto 4" step no UE 2X10 Sit to stands no UE assist 10X  10/24/23:  MMT  DGI  30 sec chair rise test  TUG  HEP given   10/20/23:  PT Evaluation                                                                                                                             PATIENT EDUCATION:  Education details: PT evaluation, objective findings, POC, Importance of HEP, Precautions, Clinic policies  Person educated: Patient Education method: Explanation and Demonstration Education comprehension: verbalized understanding and returned demonstration  HOME EXERCISE PROGRAM: Access Code: [email or web address] URL: [email or web address] Date: 10/24/2023 Prepared by: Virgia Griffins Powell-Butler  Exercises - Standing Single Leg Stance with Counter Support  - 2 x daily - 7 x weekly - 3 sets - 30 hold - Standing Hip Extension with Counter Support  - 2 x daily - 7 x weekly - 2 sets - 10 reps - Standing Hip Abduction with Counter Support  - 2 x daily - 7 x weekly - 2 sets - 10 reps  ASSESSMENT:  CLINICAL IMPRESSION: Re-eval completed this session.  Pt has made improvements with all test measures.  States she feels 95% improvement and has ABC score for confidence of 95.6%.  Pt has met all goals at this time and is ready for discharge.  Pt  has all needed copies of HEP and reports no further needs.   OBJECTIVE IMPAIRMENTS: Abnormal gait and postural dysfunction.   ACTIVITY LIMITATIONS: pt reports no limitations   PARTICIPATION LIMITATIONS: pt reports no limitations  REHAB POTENTIAL: Good  CLINICAL DECISION MAKING: Stable/uncomplicated  EVALUATION COMPLEXITY: Low   GOALS: Goals reviewed with patient? No  SHORT TERM GOALS: Target date: 11/03/23 Patient will be independent with performance of HEP to demonstrate adequate self management of symptoms.  Baseline:  Goal status: MET  2.   Patient will report at least a 25% improvement with function or pain overall since beginning PT. Baseline:  Goal status: MET   LONG TERM GOALS: Target date: 12/01/23  Patient will improve ABC Scale score by at least 5% to demonstrate improved perceived function with balance.  Baseline: Goal status: MET  Patient will score at least a  4- on bilat. hip ext and abd MMT to show increased LE strength and/or power and improve ambulation/gait mechanics.  Baseline:  Goal status: MET 3.   Patient will be able to maintain SL balance for at least 10" bilaterally in order to improve function with everyday task such as stair navigation. Baseline: Goal status: MET 4. Patient will improve DGI score by at least 20/24 in order to demonstrate Improved dynamic balance for decreased risk of falls.  Baseline: Goal status: MET  PLAN:  PT FREQUENCY: 1-2x/week  PT DURATION: 6 weeks  PLANNED INTERVENTIONS: 97164- PT Re-evaluation, 97110-Therapeutic exercises, 97530- Therapeutic activity, [id number]- Neuromuscular re-education, 97535- Self Care, [id number]- Manual therapy, 96295- Traction (mechanical), Patient/Family education, Dry  Needling, Joint mobilization, Spinal mobilization, Scar mobilization, DME instructions, and Moist heat  PLAN FOR NEXT SESSION:  All goals met and ready for discharge at this time.    Lorenso Romance, PTA/CLT [facility]  Rehabilitation [facility] Ph: [phone]  10:12 AM, 11/23/23

## 2023-11-30 ENCOUNTER — Encounter (HOSPITAL_COMMUNITY)

## 2023-12-12 ENCOUNTER — Ambulatory Visit (HOSPITAL_COMMUNITY)
Admission: RE | Admit: 2023-12-12 | Discharge: 2023-12-12 | Disposition: A | Discharge status: Home / Self Care | Source: Ambulatory Visit | Attending: Internal Medicine | Admitting: Internal Medicine

## 2023-12-12 ENCOUNTER — Encounter (HOSPITAL_COMMUNITY): Payer: Self-pay

## 2023-12-12 DIAGNOSIS — Z1231 Encounter for screening mammogram for malignant neoplasm of breast: Secondary | ICD-10-CM | POA: Insufficient documentation

## 2024-02-09 ENCOUNTER — Ambulatory Visit: Attending: Nurse Practitioner | Admitting: Nurse Practitioner

## 2024-02-09 ENCOUNTER — Encounter: Payer: Self-pay | Admitting: Nurse Practitioner

## 2024-02-09 VITALS — BP 116/70 | HR 61 | Ht 62.0 in | Wt 197.2 lb

## 2024-02-09 DIAGNOSIS — R0902 Hypoxemia: Secondary | ICD-10-CM | POA: Diagnosis not present

## 2024-02-09 DIAGNOSIS — I10 Essential (primary) hypertension: Secondary | ICD-10-CM | POA: Diagnosis not present

## 2024-02-09 DIAGNOSIS — R0609 Other forms of dyspnea: Secondary | ICD-10-CM | POA: Diagnosis not present

## 2024-02-09 DIAGNOSIS — I2510 Atherosclerotic heart disease of native coronary artery without angina pectoris: Secondary | ICD-10-CM | POA: Diagnosis not present

## 2024-02-09 DIAGNOSIS — I5032 Chronic diastolic (congestive) heart failure: Secondary | ICD-10-CM

## 2024-02-09 DIAGNOSIS — E785 Hyperlipidemia, unspecified: Secondary | ICD-10-CM | POA: Diagnosis not present

## 2024-02-09 MED ORDER — NITROGLYCERIN 0.4 MG SL SUBL: 0.4000 mg | SUBLINGUAL_TABLET | SUBLINGUAL | 3 refills | Status: AC | PRN

## 2024-02-09 NOTE — Progress Notes (Addendum)
 Cardiology Office Note:  .   Date: 02/09/2024 ID:  Gloria Mccoy, DOB [birth year], MRN [id number] PCP: Bertell Satterfield, MD  [facility] Providers Cardiologist:  Alvan Carrier, MD    History of Present Illness: .   Gloria Mccoy is a 78 y.o. female with a PMH of CAD, s/p CABG in 2019, HLD, hypertension, history of breast cancer, who presents today for hospital follow-up.  Last seen by Dr. Carrier Alvan on September 15, 2023.  Blood pressure was above goal and losartan  increased to 25 mg daily, otherwise she was doing well.  Hospitalized in April 2025 due to acute on chronic diastolic CHF.  Reported worsening dyspnea for the last 4 days.  She had positive chest tightness.  Prior to hospital stay, she was treated for allergies with no improvement in her symptoms.  Echo revealed normal LVEF with moderate asymmetric LVH of basal segment, RV not well-visualized, trivial pericardial effusion, trivial MR and TR.  Received IV Lasix  for diuresis.  SGLT2 inhibitor was added to medication regimen.  Received electrolyte supplements.  11/09/2023 - Today she presents for hospital follow-up.  She states she is doing well. Weights are stable per her report. Denies any chest pain, shortness of breath, palpitations, syncope, presyncope, dizziness, orthopnea, PND, swelling or significant weight changes, acute bleeding, or claudication.  02/09/2024 -  Here for follow-up. She tells me she lost paperwork for Jardiance , appears to be patient assistance paperwork. Overall doing well but does admit to some DOE that has been going on for the past month. Denies any chest pain, palpitations, syncope, presyncope, dizziness, orthopnea, PND, swelling or significant weight changes, acute bleeding, or claudication.   ROS: Negative. See HPI.   Studies Reviewed: SABRA    EKG: EKG is not ordered today.   Echo 09/2023: 1. Left ventricular ejection fraction, by estimation, is 70 to 75%. The  left ventricle has  hyperdynamic function. The left ventricle has no  regional wall motion abnormalities. There is moderate asymmetric left  ventricular hypertrophy of the basal-septal   segment. Left ventricular diastolic parameters are indeterminate.   2. Right ventricular systolic function was not well visualized. The right  ventricular size is not well visualized. Tricuspid regurgitation signal is  inadequate for assessing PA pressure.   3. The mitral valve is normal in structure. Trivial mitral valve  regurgitation. No evidence of mitral stenosis.   4. The aortic valve was not well visualized. Aortic valve regurgitation  is not visualized. No aortic stenosis is present.   5. The inferior vena cava is normal in size with greater than 50%  respiratory variability, suggesting right atrial pressure of 3 mmHg.  LHC 10/2017:  Ost 1st Diag to 1st Diag lesion is 40% stenosed. Ost Cx lesion is 25% stenosed. Ost Ramus lesion is 20% stenosed. Ost LAD lesion is 45% stenosed. Prox LAD to Mid LAD lesion is 80% stenosed. Origin lesion is 100% stenosed. Origin lesion is 99% stenosed. Origin to Dist Graft lesion is 99% stenosed. Dist Graft lesion is 100% stenosed. LV end diastolic pressure is mildly elevated. The left ventricular systolic function is normal.   Hyperdynamic LV function with an EF of 65% without focal segmental wall motion abnormalities.   Native coronary obstructive disease with 40% ostial LAD stenosis, diffuse 40 to 50% proximal diagonal stenosis with 80% LAD stenosis diffusely after the diagonal takeoff and evidence for competitive filling to the mid LAD via the LIMA graft; 25% ostial smooth narrowing in the ramus intermediate vessel; 25%  ostial smooth narrowing in the left circumflex vessel; normal RCA.   Widely patent LIMA graft which supplies the mid LAD.   Totally ostial occlusion of the SVG which had supplied the ramus intermediate vessel.   Subtotally occluded ostial vein graft with diffuse  99% narrowing insistent with an atretic graft  which does not fill the diagonal vessel.   RECOMMENDATION: Medical therapy.  I suspect the early vein graft occlusion was contributed by improvement in the prior pre-CABG stenoses.  Physical Exam:   VS:  BP 116/70   Pulse 61   Ht 5' 2 (1.575 m)   Wt 197 lb 3.2 oz (89.4 kg)   SpO2 90%   BMI 36.07 kg/m    Wt Readings from Last 3 Encounters:  02/09/24 197 lb 3.2 oz (89.4 kg)  11/09/23 196 lb (88.9 kg)  09/29/23 192 lb 8 oz (87.3 kg)    SpO2 rechecked during exam and was 91%.   GEN: Well nourished, well developed in no acute distress NECK: No JVD; No carotid bruits CARDIAC: S1/S2, RRR, no murmurs, rubs, gallops RESPIRATORY:  Clear to auscultation without rales, wheezing or rhonchi  ABDOMEN: Soft, non-tender, non-distended EXTREMITIES:  No edema; No deformity   ASSESSMENT AND PLAN: .    HFpEF Stage C, NYHA class I-II symptoms.  EF 70 to 75% in April 2025. Diastolic parameters are indeterminate. Euvolemic and well compensated on exam, but does admit to DOE.  Will provide paperwork for pt regarding her Jardiance  and will help with patient assistance.  No other medication changes at this time. Low sodium diet, fluid restriction <2L, and daily weights encouraged. Educated to contact our office for weight gain of 2 lbs overnight or 5 lbs in one week. Will obtain limited Echo due to her DOE.   CAD, s/p CABG Denies any chest pain. No indication for ischemic evaluation.  Continue aspirin , atorvastatin , losartan , Lopressor , and nitroglycerin  as needed. Heart healthy diet and regular cardiovascular exercise encouraged. Will refill her NTG per her request.   HLD LDL in August 2024 was 37.  She is currently at goal.  No medication changes at this time. Heart healthy diet and regular cardiovascular exercise encouraged. Continue to follow with PCP.  HTN Blood pressure stable. Discussed to monitor BP at home at least 2 hours after medications and  sitting for 5-10 minutes.  No medication changes at this time.  Will repeat BMET as noted above. Heart healthy diet and regular cardiovascular exercise encouraged.   5. DOE and hypoxia SpO2 upon arrival was 90%, on recheck was 91%. Not in acute distress on exam. Does admit to DOE. Will arrange for PFT's for further evaluation and recommended to obtain pulse ox to evaluate readings at home and update us . Care and ED precautions discussed. Also recommended to f/u with PCP to see if she qualifies for O2.   Dispo: Care and ED precautions discussed. Follow-up with MD/APP in 6 weeks or sooner if any changes.  Signed, Almarie Crate, NP

## 2024-02-09 NOTE — Patient Instructions (Addendum)
 Medication Instructions:  Your physician recommends that you continue on your current medications as directed. Please refer to the Current Medication list given to you today.  Labwork: None   Testing/Procedures: Your physician has requested that you have an echocardiogram. Echocardiography is a painless test that uses sound waves to create images of your heart. It provides your doctor with information about the size and shape of your heart and how well your heart's chambers and valves are working. This procedure takes approximately one hour. There are no restrictions for this procedure. Please do NOT wear cologne, perfume, aftershave, or lotions (deodorant is allowed). Please arrive 15 minutes prior to your appointment time.  Please note: We ask at that you not bring children with you during ultrasound (echo/ vascular) testing. Due to room size and safety concerns, children are not allowed in the ultrasound rooms during exams. Our front office staff cannot provide observation of children in our lobby area while testing is being conducted. An adult accompanying a patient to their appointment will only be allowed in the ultrasound room at the discretion of the ultrasound technician under special circumstances. We apologize for any inconvenience. Your physician has recommended that you have a pulmonary function test. Pulmonary Function Tests are a group of tests that measure how well air moves in and out of your lungs.  Follow-Up: Your physician recommends that you schedule a follow-up appointment in: 6 weeks   Any Other Special Instructions Will Be Listed Below (If Applicable).  If you need a refill on your cardiac medications before your next appointment, please call your pharmacy.

## 2024-02-16 ENCOUNTER — Other Ambulatory Visit

## 2024-02-17 DIAGNOSIS — Z0001 Encounter for general adult medical examination with abnormal findings: Secondary | ICD-10-CM | POA: Diagnosis not present

## 2024-02-17 DIAGNOSIS — R269 Unspecified abnormalities of gait and mobility: Secondary | ICD-10-CM | POA: Diagnosis not present

## 2024-02-17 DIAGNOSIS — E6609 Other obesity due to excess calories: Secondary | ICD-10-CM | POA: Diagnosis not present

## 2024-02-17 DIAGNOSIS — I10 Essential (primary) hypertension: Secondary | ICD-10-CM | POA: Diagnosis not present

## 2024-02-17 DIAGNOSIS — Z6833 Body mass index (BMI) 33.0-33.9, adult: Secondary | ICD-10-CM | POA: Diagnosis not present

## 2024-02-17 DIAGNOSIS — M1991 Primary osteoarthritis, unspecified site: Secondary | ICD-10-CM | POA: Diagnosis not present

## 2024-02-17 DIAGNOSIS — M47812 Spondylosis without myelopathy or radiculopathy, cervical region: Secondary | ICD-10-CM | POA: Diagnosis not present

## 2024-02-17 DIAGNOSIS — Z1331 Encounter for screening for depression: Secondary | ICD-10-CM | POA: Diagnosis not present

## 2024-02-17 DIAGNOSIS — E039 Hypothyroidism, unspecified: Secondary | ICD-10-CM | POA: Diagnosis not present

## 2024-02-22 ENCOUNTER — Other Ambulatory Visit: Payer: Self-pay | Admitting: Cardiology

## 2024-03-06 ENCOUNTER — Ambulatory Visit: Attending: Nurse Practitioner

## 2024-03-06 DIAGNOSIS — R0902 Hypoxemia: Secondary | ICD-10-CM

## 2024-03-06 DIAGNOSIS — R0609 Other forms of dyspnea: Secondary | ICD-10-CM

## 2024-03-06 LAB — ECHOCARDIOGRAM LIMITED
AR max vel: 1.77 cm2
AV Peak grad: 11.2 mmHg
Ao pk vel: 1.67 m/s
Area-P 1/2: 4.12 cm2
Calc EF: 66.4 %
S' Lateral: 2.3 cm
Single Plane A2C EF: 56.6 %
Single Plane A4C EF: 73.6 %

## 2024-03-11 ENCOUNTER — Ambulatory Visit: Payer: Self-pay | Admitting: Nurse Practitioner

## 2024-03-20 ENCOUNTER — Ambulatory Visit (HOSPITAL_COMMUNITY)
Admission: RE | Admit: 2024-03-20 | Discharge: 2024-03-20 | Disposition: A | Discharge status: Home / Self Care | Source: Ambulatory Visit | Attending: Nurse Practitioner | Admitting: Nurse Practitioner

## 2024-03-20 ENCOUNTER — Ambulatory Visit: Admitting: Cardiology

## 2024-03-20 DIAGNOSIS — R0902 Hypoxemia: Secondary | ICD-10-CM | POA: Insufficient documentation

## 2024-03-20 DIAGNOSIS — R0609 Other forms of dyspnea: Secondary | ICD-10-CM | POA: Insufficient documentation

## 2024-03-20 LAB — PULMONARY FUNCTION TEST
DL/VA % pred: 101 %
DL/VA: 4.25 ml/min/mmHg/L
DLCO unc % pred: 45 %
DLCO unc: 7.9 ml/min/mmHg
FEF 25-75 Post: 0.77 L/s
FEF 25-75 Pre: 0.72 L/s
FEF2575-%Change-Post: 7 %
FEF2575-%Pred-Post: 57 %
FEF2575-%Pred-Pre: 53 %
FEV1-%Change-Post: 1 %
FEV1-%Pred-Post: 60 %
FEV1-%Pred-Pre: 60 %
FEV1-Post: 1.09 L
FEV1-Pre: 1.08 L
FEV1FVC-%Change-Post: 6 %
FEV1FVC-%Pred-Pre: 99 %
FEV6-%Change-Post: -5 %
FEV6-%Pred-Post: 60 %
FEV6-%Pred-Pre: 63 %
FEV6-Post: 1.38 L
FEV6-Pre: 1.45 L
FEV6FVC-%Pred-Post: 105 %
FEV6FVC-%Pred-Pre: 105 %
FVC-%Change-Post: -5 %
FVC-%Pred-Post: 57 %
FVC-%Pred-Pre: 60 %
FVC-Post: 1.38 L
FVC-Pre: 1.45 L
Post FEV1/FVC ratio: 79 %
Post FEV6/FVC ratio: 100 %
Pre FEV1/FVC ratio: 74 %
Pre FEV6/FVC Ratio: 100 %
RV % pred: 118 %
RV: 2.68 L
TLC % pred: 89 %
TLC: 4.24 L

## 2024-03-20 MED ORDER — ALBUTEROL SULFATE (2.5 MG/3ML) 0.083% IN NEBU
2.5000 mg | INHALATION_SOLUTION | Freq: Once | RESPIRATORY_TRACT | Status: AC
Start: 2024-03-20 — End: 2024-03-20
  Administered 2024-03-20: 2.5 mg via RESPIRATORY_TRACT

## 2024-03-21 ENCOUNTER — Telehealth: Payer: Self-pay | Admitting: *Deleted

## 2024-03-21 ENCOUNTER — Other Ambulatory Visit (HOSPITAL_BASED_OUTPATIENT_CLINIC_OR_DEPARTMENT_OTHER): Payer: Self-pay

## 2024-03-21 ENCOUNTER — Telehealth: Payer: Self-pay | Admitting: Nurse Practitioner

## 2024-03-21 ENCOUNTER — Ambulatory Visit: Attending: Nurse Practitioner | Admitting: Nurse Practitioner

## 2024-03-21 ENCOUNTER — Encounter: Payer: Self-pay | Admitting: Nurse Practitioner

## 2024-03-21 VITALS — BP 120/60 | HR 58 | Ht 62.0 in | Wt 194.0 lb

## 2024-03-21 DIAGNOSIS — I10 Essential (primary) hypertension: Secondary | ICD-10-CM

## 2024-03-21 DIAGNOSIS — I2510 Atherosclerotic heart disease of native coronary artery without angina pectoris: Secondary | ICD-10-CM

## 2024-03-21 DIAGNOSIS — E785 Hyperlipidemia, unspecified: Secondary | ICD-10-CM | POA: Diagnosis not present

## 2024-03-21 DIAGNOSIS — I5032 Chronic diastolic (congestive) heart failure: Secondary | ICD-10-CM | POA: Diagnosis not present

## 2024-03-21 DIAGNOSIS — Z79899 Other long term (current) drug therapy: Secondary | ICD-10-CM

## 2024-03-21 DIAGNOSIS — R0609 Other forms of dyspnea: Secondary | ICD-10-CM

## 2024-03-21 MED ORDER — EMPAGLIFLOZIN 10 MG PO TABS
10.0000 mg | ORAL_TABLET | Freq: Every day | ORAL | 1 refills | Status: DC
  Filled 2024-03-21 (×2): qty 90, 90d supply, fill #0

## 2024-03-21 NOTE — Telephone Encounter (Signed)
 Copied from CRM #[id number]. Topic: Referral - Question >> Mar 21, 2024 12:43 PM Nathanel DEL wrote: Reason for CRM: pt has referral for dr Jude.  Referring provider thought dr Jude was still in [place].  Pt declined to schedule w/ another provider, due to who her dr recommended.  Dr Jude is booked through end of year.  Advised pt I would send a message for possible work in, but she may want to call her dr and ask if OK to see someone in [place], as we have some very good drs there as well. >> Mar 21, 2024  1:00 PM Almarie PENNER wrote: [place],   Looks like you called this patient about her referral. Here's an update from [place]. Maybe try her again?   See referral notes

## 2024-03-21 NOTE — Progress Notes (Signed)
 Cardiology Office Note:  .   Date: 03/21/2024 ID:  Gloria Mccoy, DOB 13-Feb-1946, MRN [id number] PCP: Bertell Satterfield, MD  [facility] Providers Cardiologist:  Alvan Carrier, MD    History of Present Illness: .   Gloria Mccoy is a 78 y.o. female with a PMH of CAD, s/p CABG in 2019, HLD, hypertension, history of breast cancer, who presents today for 6 week follow-up.  Last seen by Dr. Carrier Alvan on September 15, 2023.  Blood pressure was above goal and losartan  increased to 25 mg daily, otherwise she was doing well.  Hospitalized in April 2025 due to acute on chronic diastolic CHF.  Reported worsening dyspnea for the last 4 days.  She had positive chest tightness.  Prior to hospital stay, she was treated for allergies with no improvement in her symptoms.  Echo revealed normal LVEF with moderate asymmetric LVH of basal segment, RV not well-visualized, trivial pericardial effusion, trivial MR and TR.  Received IV Lasix  for diuresis.  SGLT2 inhibitor was added to medication regimen.  Received electrolyte supplements.  11/09/2023 - Today she presents for hospital follow-up.  She states she is doing well. Weights are stable per her report. Denies any chest pain, shortness of breath, palpitations, syncope, presyncope, dizziness, orthopnea, PND, swelling or significant weight changes, acute bleeding, or claudication.  02/09/2024 -  Here for follow-up. She tells me she lost paperwork for Jardiance , appears to be patient assistance paperwork. Overall doing well but does admit to some DOE that has been going on for the past month. Denies any chest pain, palpitations, syncope, presyncope, dizziness, orthopnea, PND, swelling or significant weight changes, acute bleeding, or claudication.  03/21/2024 - Here for follow-up. Currently not taking Jardiance . Admits to stable DOE, gets winded when speaking on the telephone. Denies any chest pain, palpitations, syncope, presyncope, dizziness, orthopnea,  PND, swelling or significant weight changes, acute bleeding, or claudication.   ROS: Negative. See HPI.   Studies Reviewed: Gloria Mccoy    EKG: EKG is not ordered today.   Limited Echo 02/2024:  1. Limited study.   2. Left ventricular ejection fraction, by estimation, is 65 to 70%. The  left ventricle has normal function. The left ventricle has no regional  wall motion abnormalities. There is moderate asymmetric left ventricular  hypertrophy of the basal segment.  Left ventricular diastolic parameters are indeterminate. Elevated left  atrial pressure.   3. Right ventricular systolic function is normal. The right ventricular  size is normal. Tricuspid regurgitation signal is inadequate for assessing  PA pressure.   4. The mitral valve is grossly normal. Mild mitral valve regurgitation.   5. The aortic valve is tricuspid. There is mild calcification of the  aortic valve. Aortic valve regurgitation is not visualized.   6. The inferior vena cava is normal in size with greater than 50%  respiratory variability, suggesting right atrial pressure of 3 mmHg.   Comparison(s): A prior study was performed on 09/21/2023. Prior images  reviewed side by side. LVEF 65-70% with moderate basal septal hypertrophy.  Mild mitral regurgitation.  1. Limited study.   2. Left ventricular ejection fraction, by estimation, is 65 to 70%. The  left ventricle has normal function. The left ventricle has no regional  wall motion abnormalities. There is moderate asymmetric left ventricular  hypertrophy of the basal segment.  Left ventricular diastolic parameters are indeterminate. Elevated left  atrial pressure.   3. Right ventricular systolic function is normal. The right ventricular  size is normal. Tricuspid  regurgitation signal is inadequate for assessing  PA pressure.   4. The mitral valve is grossly normal. Mild mitral valve regurgitation.   5. The aortic valve is tricuspid. There is mild calcification of the   aortic valve. Aortic valve regurgitation is not visualized.   6. The inferior vena cava is normal in size with greater than 50%  respiratory variability, suggesting right atrial pressure of 3 mmHg.   Comparison(s): A prior study was performed on 09/21/2023. Prior images  reviewed side by side. LVEF 65-70% with moderate basal septal hypertrophy.  Mild mitral regurgitation.   Echo 09/2023: 1. Left ventricular ejection fraction, by estimation, is 70 to 75%. The  left ventricle has hyperdynamic function. The left ventricle has no  regional wall motion abnormalities. There is moderate asymmetric left  ventricular hypertrophy of the basal-septal   segment. Left ventricular diastolic parameters are indeterminate.   2. Right ventricular systolic function was not well visualized. The right  ventricular size is not well visualized. Tricuspid regurgitation signal is  inadequate for assessing PA pressure.   3. The mitral valve is normal in structure. Trivial mitral valve  regurgitation. No evidence of mitral stenosis.   4. The aortic valve was not well visualized. Aortic valve regurgitation  is not visualized. No aortic stenosis is present.   5. The inferior vena cava is normal in size with greater than 50%  respiratory variability, suggesting right atrial pressure of 3 mmHg.  LHC 10/2017:  Ost 1st Diag to 1st Diag lesion is 40% stenosed. Ost Cx lesion is 25% stenosed. Ost Ramus lesion is 20% stenosed. Ost LAD lesion is 45% stenosed. Prox LAD to Mid LAD lesion is 80% stenosed. Origin lesion is 100% stenosed. Origin lesion is 99% stenosed. Origin to Dist Graft lesion is 99% stenosed. Dist Graft lesion is 100% stenosed. LV end diastolic pressure is mildly elevated. The left ventricular systolic function is normal.   Hyperdynamic LV function with an EF of 65% without focal segmental wall motion abnormalities.   Native coronary obstructive disease with 40% ostial LAD stenosis, diffuse 40 to 50%  proximal diagonal stenosis with 80% LAD stenosis diffusely after the diagonal takeoff and evidence for competitive filling to the mid LAD via the LIMA graft; 25% ostial smooth narrowing in the ramus intermediate vessel; 25% ostial smooth narrowing in the left circumflex vessel; normal RCA.   Widely patent LIMA graft which supplies the mid LAD.   Totally ostial occlusion of the SVG which had supplied the ramus intermediate vessel.   Subtotally occluded ostial vein graft with diffuse 99% narrowing insistent with an atretic graft  which does not fill the diagonal vessel.   RECOMMENDATION: Medical therapy.  I suspect the early vein graft occlusion was contributed by improvement in the prior pre-CABG stenoses.  Physical Exam:   VS:  BP 120/60 (BP Location: Left Arm, Patient Position: Sitting, Cuff Size: Normal)   Pulse (!) 58   Ht 5' 2 (1.575 m)   Wt 194 lb (88 kg)   SpO2 92%   BMI 35.48 kg/m    Wt Readings from Last 3 Encounters:  03/21/24 194 lb (88 kg)  02/09/24 197 lb 3.2 oz (89.4 kg)  11/09/23 196 lb (88.9 kg)    GEN: Well nourished, well developed in no acute distress NECK: No JVD; No carotid bruits CARDIAC: S1/S2, RRR, no murmurs, rubs, gallops RESPIRATORY:  Clear to auscultation without rales, wheezing or rhonchi  ABDOMEN: Soft, non-tender, non-distended EXTREMITIES:  No edema; No deformity  ASSESSMENT AND PLAN: .    HFpEF, medication management Stage C, NYHA class I-II symptoms.  EF 65-70% in September 2025. Euvolemic and well compensated on exam, but does admit to DOE.  Will restart Jardiance  and provide patient assistance with this medication and obtain BMET in 2 weeks.  No other medication changes at this time. Low sodium diet, fluid restriction <2L, and daily weights encouraged. Educated to contact our office for weight gain of 2 lbs overnight or 5 lbs in one week.   CAD, s/p CABG Denies any chest pain. No indication for ischemic evaluation.  Continue current  medication regimen.  Heart healthy diet and regular cardiovascular exercise encouraged.  HLD LDL in August 2024 was 37.  She is currently at goal.  No medication changes at this time. Heart healthy diet and regular cardiovascular exercise encouraged. Continue to follow with PCP.  HTN Blood pressure stable. Discussed to monitor BP at home at least 2 hours after medications and sitting for 5-10 minutes.  No medication changes at this time.  Will repeat BMET as noted above. Heart healthy diet and regular cardiovascular exercise encouraged.   5. DOE and hypoxia Not in acute distress on exam. Does admit to DOE. Recent PFT's revealed some mild obstructive airway disease.  Care and ED precautions discussed. Will refer to Pulmonology for further evaluation.  If symptoms do not improve by next office visit, plan to consider ischemic evaluation, however symptoms appear to be lung related.   Dispo: Care and ED precautions discussed. Follow-up with MD/APP in 3-4 months or sooner if any changes.  Signed, Almarie Crate, NP

## 2024-03-21 NOTE — Telephone Encounter (Signed)
 Pt is requesting a callback regarding her stating she was referred to Dr. Jude regarding COPD but nothing is available. They're booked until December and the January schedule hasn't opened yet. She'd like for Miriam to let her know how to move forward with the recommendation she gave. Please advise

## 2024-03-21 NOTE — Telephone Encounter (Signed)
 Spoke to patient and verbalized providers recommendations- patient voiced understanding and stated she will call Dr. Cyndi office to acquire an appt with PA/NP.

## 2024-03-21 NOTE — Patient Instructions (Addendum)
 Medication Instructions:  Your physician has recommended you make the following change in your medication:  Restart jardiance  10 mg daily Continue all other medications as prescribed  Labwork: BMET in 2 weeks (04/04/2024) at [facility] ([address] [place]. [place]) Non-fasting  Testing/Procedures: none  Follow-Up: Your physician recommends that you schedule a follow-up appointment in: 3-4 months  Any Other Special Instructions Will Be Listed Below (If Applicable). You have been referred to Pulmonology  If you need a refill on your cardiac medications before your next appointment, please call your pharmacy.

## 2024-04-05 ENCOUNTER — Ambulatory Visit: Payer: Self-pay | Admitting: Nurse Practitioner

## 2024-04-05 LAB — BASIC METABOLIC PANEL WITH GFR
BUN/Creatinine Ratio: 12 (ref 12–28)
BUN: 11 mg/dL (ref 8–27)
CO2: 28 mmol/L (ref 20–29)
Calcium: 9.4 mg/dL (ref 8.7–10.3)
Chloride: 99 mmol/L (ref 96–106)
Creatinine, Ser: 0.91 mg/dL (ref 0.57–1.00)
Glucose: 139 mg/dL — ABNORMAL HIGH (ref 70–99)
Potassium: 4.2 mmol/L (ref 3.5–5.2)
Sodium: 139 mmol/L (ref 134–144)
eGFR: 64 mL/min/1.73 (ref >59)

## 2024-04-05 NOTE — Progress Notes (Signed)
 Update: MLH1 [id number] VUS reclassified to benign. Amended report date is 02/21/2024.

## 2024-05-14 NOTE — Progress Notes (Signed)
 "  New Patient Pulmonology Office Visit   Subjective:  Patient ID: Gloria Mccoy, female    DOB: July 11, 1945  MRN: [id number]  Referred by: Miriam Norris, NP  CC:  Chief Complaint  Patient presents with   Establish Care    DOE - dry cough x3 months     HPI Gloria Mccoy is a 78 y.o. female with hx of COPD (FEV1 1 L, 60% predicted) who presents for initial evaluation of dyspnea.  Discussed the use of AI scribe software for clinical note transcription with the patient, who gave verbal consent to proceed. History of Present Illness Gloria Mccoy is a 78 year old female with COPD who presents with shortness of breath and chronic cough.  She experiences shortness of breath, particularly during exertion such as climbing stairs, working in the yard, or carrying items over long distances. No shortness of breath when changing clothes or showering, and she can walk on level ground without issue if she takes her time. She was hospitalized in April for breathing problems and was treated with prednisone , antibiotics, and breathing treatments. She uses a red albuterol  inhaler approximately once a month, primarily when experiencing wheezing, which she reports may occur with weather changes or exposure to strong perfumes.  She has had a chronic dry cough for two to three years, which is exacerbated by talking on the phone. The cough does not seem to worsen over time and is more frequent during the day. No specific factors have been identified that alleviate or exacerbate the cough.  Her past medical history includes open heart surgery with stent placement and a subsequent CABG following a heart attack. She denies smoking but was exposed to secondhand smoke from her husband. 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  She denies any current smoking, vaping, or drug use. She does not have pets and reports no mold issues at home. She  has received her flu shot and is up to date on vaccinations.  ROS  Allergies: Aloe vera, Sulfa antibiotics, and Sulfasalazine  Current Outpatient Medications:    albuterol  (VENTOLIN  HFA) 108 (90 Base) MCG/ACT inhaler, Inhale 2 puffs into the lungs every 6 (six) hours as needed for wheezing or shortness of breath., Disp: 51 each, Rfl: 6   AREXVY 120 MCG/0.5ML injection, Inject 0.5 mLs into the muscle once., Disp: , Rfl:    aspirin  EC 81 MG tablet, Take 81 mg by mouth daily., Disp: , Rfl:    atorvastatin  (LIPITOR ) 80 MG tablet, TAKE 1 TABLET ONE TIME DAILY AT 6PM, Disp: 90 tablet, Rfl: 3   Bioflavonoid Products (VITAMIN C) CHEW, Chew 1 each by mouth daily., Disp: , Rfl:    Cholecalciferol (VITAMIN D  PO), Take 1 tablet by mouth daily. , Disp: , Rfl:    Cyanocobalamin  (VITAMIN B-12 PO), Take 1 tablet by mouth daily., Disp: , Rfl:    empagliflozin  (JARDIANCE ) 10 MG TABS tablet, Take 1 tablet (10 mg total) by mouth daily., Disp: 90 tablet, Rfl: 1   fish oil-omega-3 fatty acids  1000 MG capsule, Take 1 g by mouth daily., Disp: , Rfl:    fluticasone  (FLONASE ) 50 MCG/ACT nasal spray, Place 2 sprays into both nostrils daily., Disp: 16 g, Rfl: 0   furosemide  (LASIX ) 20 MG tablet, Take 2 tablets (40 mg total) by mouth daily., Disp: 60 tablet, Rfl: 0   levothyroxine  (SYNTHROID ) 25 MCG tablet, Take 25 mcg by mouth daily. Take 1  tablet by mouth every day with 1 tablet of Levothyroxine  75mcg for a combined dose of 100mcg., Disp: , Rfl:    levothyroxine  (SYNTHROID , LEVOTHROID) 75 MCG tablet, Take 75 mcg by mouth daily. Take 1 tablet by mouth every day with 1 tablet of Levothyroxine  25mcg for a combined dose of 100mcg., Disp: , Rfl:    losartan  (COZAAR ) 25 MG tablet, TAKE 1 TABLET (25 MG TOTAL) BY MOUTH DAILY. (DOSE INCREASE), Disp: 90 tablet, Rfl: 3   methocarbamol  (ROBAXIN ) 500 MG tablet, Take 500 mg by mouth in the morning and at bedtime., Disp: , Rfl:    metoprolol  tartrate (LOPRESSOR ) 25 MG tablet, TAKE 1  TABLET TWICE DAILY, Disp: 180 tablet, Rfl: 3   nitroGLYCERIN  (NITROSTAT ) 0.4 MG SL tablet, Place 1 tablet (0.4 mg total) under the tongue every 5 (five) minutes x 3 doses as needed for chest pain (if no relief after 3rd dose, proceed to ED or call 911)., Disp: 25 tablet, Rfl: 3   olopatadine  (PATADAY ) 0.1 % ophthalmic solution, Place 1 drop into both eyes 2 (two) times daily., Disp: 5 mL, Rfl: 0   potassium chloride  SA (KLOR-CON  M) 20 MEQ tablet, Take 1 tablet (20 mEq total) by mouth daily., Disp: 30 tablet, Rfl: 4   SPIKEVAX syringe, Inject 0.5 mLs into the muscle once., Disp: , Rfl:    Tiotropium Bromide-Olodaterol (STIOLTO RESPIMAT ) 2.5-2.5 MCG/ACT AERS, Inhale 2 puffs into the lungs daily., Disp: 12 each, Rfl: 6   Tiotropium Bromide-Olodaterol (STIOLTO RESPIMAT ) 2.5-2.5 MCG/ACT AERS, Inhale 2 puffs into the lungs daily., Disp: , Rfl:    vitamin E  100 UNIT capsule, Take 1 capsule (100 Units total) by mouth daily., Disp: 30 capsule, Rfl: 0 Past Medical History:  Diagnosis Date   Arthritis    hands sometimes (07/13/2017)   Coronary artery disease    a. s/p CABG x3 in 06/2017 with LIMA-LAD, SVG-D1, and SVG-RI.  b. 10/2017: cath showing a widely patent LIMA-LAD with ostial occlusion of the SVG-RI and subtotally occluded atretic SVG-D1. Graft occlusion thought to be 2ry to improvement in pre-CABG stenoses.    Essential hypertension    Family history of adverse reaction to anesthesia    sister had a complication that was stated she had a foggy episode after her breast surgery was readmitted 1 day post op after being discharged from the hospital. Sister also has significant lung problems that could have contributed to this    Family history of breast cancer    Family history of colon cancer    Hypothyroid    Meniere disease    Myocardial infarction (HCC) 06/2017   during [facility] rehab   Obesity    Osteopenia 01/2012   T score -1.3 FRAX 7.9%/0.6%   Personal history of chemotherapy 2021    Personal history of radiation therapy 2021   left breast   Past Surgical History:  Procedure Laterality Date   BREAST LUMPECTOMY WITH RADIOACTIVE SEED AND SENTINEL LYMPH NODE BIOPSY Left 09/01/2018   Procedure: LEFT BREAST LUMPECTOMY WITH RADIOACTIVE SEED AND SENTINEL LYMPH NODE BIOPSY;  Surgeon: Curvin Deward MOULD, MD;  Location: [facility] OR;  Service: General;  Laterality: Left;   BREAST SURGERY Left 2020   INVASIVE DUCTAL CARCINOMA/DUCTAL CARCINOMA IN SITU/MARGINS UNINVOLVED   CARDIAC CATHETERIZATION  07/13/2017   CATARACT EXTRACTION W/PHACO Right 01/28/2015   Procedure: CATARACT EXTRACTION PHACO AND INTRAOCULAR LENS PLACEMENT :  CDE:  5.70;  Surgeon: Oneil Platts, MD;  Location: [facility] ORS;  Service: Ophthalmology;  Laterality: Right;  CATARACT EXTRACTION W/PHACO Left 02/11/2015   Procedure: CATARACT EXTRACTION PHACO AND INTRAOCULAR LENS PLACEMENT (IOC);  Surgeon: Oneil Platts, MD;  Location: [facility] ORS;  Service: Ophthalmology;  Laterality: Left;  CDE: 7.38   COLONOSCOPY N/A 09/26/2012   Procedure: COLONOSCOPY;  Surgeon: Oneil DELENA Budge, MD;  Location: [facility] SUITE;  Service: Gastroenterology;  Laterality: N/A;   CORONARY ARTERY BYPASS GRAFT N/A 07/15/2017   Procedure: CORONARY ARTERY BYPASS GRAFTING (CABG) X 3 USING LEFT INTERNAL MAMMARY ARTERY AND RIGHT SAPHENOUS VEIN- ENDOSCOPICALLY HARVESTED;  Surgeon: Kerrin Elspeth BROCKS, MD;  Location: [facility] OR;  Service: Open Heart Surgery;  Laterality: N/A;   IR IMAGING GUIDED PORT INSERTION  12/29/2018   IR REMOVAL TUN ACCESS W/ PORT W/O FL MOD SED  04/26/2019   LEFT HEART CATH AND CORONARY ANGIOGRAPHY N/A 07/13/2017   Procedure: LEFT HEART CATH AND CORONARY ANGIOGRAPHY;  Surgeon: Jordan, Peter M, MD;  Location: [facility] INVASIVE CV LAB;  Service: Cardiovascular;  Laterality: N/A;   LEFT HEART CATH AND CORS/GRAFTS ANGIOGRAPHY N/A 11/03/2017   Procedure: LEFT HEART CATH AND CORS/GRAFTS ANGIOGRAPHY;  Surgeon: Burnard Debby DELENA, MD;  Location: [facility] INVASIVE CV LAB;  Service:  Cardiovascular;  Laterality: N/A;   TEE WITHOUT CARDIOVERSION N/A 07/15/2017   Procedure: TRANSESOPHAGEAL ECHOCARDIOGRAM (TEE);  Surgeon: Kerrin Elspeth BROCKS, MD;  Location: [facility] OR;  Service: Open Heart Surgery;  Laterality: N/A;   TUBAL LIGATION     TYMPANOPLASTY Left    fluid from ear drum   Family History  Problem Relation Age of Onset   Diabetes Sister        AODM   Heart disease Sister 78       CABG   Breast cancer Sister    Heart disease Brother 42       In his 105s   Colon cancer Maternal Uncle 25   Diabetes Sister        AODM   Hypertension Daughter    Hypertension Daughter    Heart attack Father        In his 38s   Stroke Mother    Stroke Maternal Grandmother    Diabetes Maternal Grandfather        d. 109   Heart attack Paternal Grandmother    Colon cancer Maternal Uncle 3   Breast cancer Cousin        dx 54s; d. 71s   Breast cancer Niece 52   Social History   Socioeconomic History   Marital status: Married    Spouse name: Not on file   Number of children: Not on file   Years of education: Not on file   Highest education level: Not on file  Occupational History   Occupation: Retired  Tobacco Use   Smoking status: Never   Smokeless tobacco: Former    Types: Snuff    Quit date: 07/31/2017  Vaping Use   Vaping status: Never Used  Substance and Sexual Activity   Alcohol use: Not Currently    Alcohol/week: 1.0 standard drink of alcohol    Types: 1 Standard drinks or equivalent per week    Comment: occasionally   Drug use: No   Sexual activity: Yes    Birth control/protection: Surgical  Other Topics Concern   Not on file  Social History Narrative      Left handed    Lives at home with husband    Caffeine- some    Social Drivers of Corporate Investment Banker Strain: Not on file  Food Insecurity: No Food Insecurity (09/21/2023)   Hunger Vital Sign    Worried About Running Out of Food in the Last Year: Never true    Ran Out of Food in the Last  Year: Never true  Transportation Needs: No Transportation Needs (09/21/2023)   PRAPARE - Administrator, Civil Service (Medical): No    Lack of Transportation (Non-Medical): No  Physical Activity: Not on file  Stress: Not on file  Social Connections: Unknown (09/21/2023)   Social Connection and Isolation Panel    Frequency of Communication with Friends and Family: Once a week    Frequency of Social Gatherings with Friends and Family: Once a week    Attends Religious Services: Not on Marketing Executive or Organizations: Not on file    Attends Banker Meetings: 1 to 4 times per year    Marital Status: Patient declined  Intimate Partner Violence: Not At Risk (09/21/2023)   Humiliation, Afraid, Rape, and Kick questionnaire    Fear of Current or Ex-Partner: No    Emotionally Abused: No    Physically Abused: No    Sexually Abused: No       Objective:  BP (!) 110/56   Pulse 65   Ht 5' 2 (1.575 m)   Wt 192 lb 12.8 oz (87.5 kg)   SpO2 91% Comment: ra  BMI 35.26 kg/m  Wt Readings from Last 3 Encounters:  05/15/24 192 lb 12.8 oz (87.5 kg)  03/21/24 194 lb (88 kg)  02/09/24 197 lb 3.2 oz (89.4 kg)   BMI Readings from Last 3 Encounters:  05/15/24 35.26 kg/m  03/21/24 35.48 kg/m  02/09/24 36.07 kg/m   SpO2 Readings from Last 3 Encounters:  05/15/24 91%  03/21/24 92%  02/09/24 91%    Physical Exam Physical Exam VITALS: SaO2- 91% CHEST: Clear to auscultation bilaterally, no wheezes. CARDIOVASCULAR: Normal heart sounds.  Diagnostic Review:  Last CBC Lab Results  Component Value Date   WBC 22.3 (H) 09/22/2023   HGB 11.6 (L) 09/22/2023   HCT 32.0 (L) 09/22/2023   MCV 83.1 09/22/2023   MCH 30.1 09/22/2023   RDW 14.2 09/22/2023   PLT 259 09/22/2023   Last metabolic panel Lab Results  Component Value Date   GLUCOSE 139 (H) 04/04/2024   NA 139 04/04/2024   K 4.2 04/04/2024   CL 99 04/04/2024   CO2 28 04/04/2024   BUN 11 04/04/2024    CREATININE 0.91 04/04/2024   EGFR 64 04/04/2024   CALCIUM  9.4 04/04/2024   PROT 7.3 09/20/2023   ALBUMIN  3.5 09/20/2023   BILITOT 0.9 09/20/2023   ALKPHOS 65 09/20/2023   AST 24 09/20/2023   ALT 16 09/20/2023   ANIONGAP 8 11/15/2023   PFT: mild obstructive defect with severe reduction of diffusion capacity.  CTA Chest 09/2023: IMPRESSION: Cardiomegaly. Scattered ground-glass opacities throughout the lungs could reflect early edema. Bibasilar atelectasis.    Assessment & Plan:   Assessment & Plan Chronic bronchitis, unspecified chronic bronchitis type (HCC) Chronic obstructive pulmonary disease (COPD) Mild to moderate COPD with FEV1 and FVC at 60%. Symptoms include chronic dry cough, exertional dyspnea, and wheezing. Reduced diffusion capacity at 45% likely due to COPD and cardiac issues. MMRC > 2, CAT > 10, no exacerbations. Gold 2, Class B. Will trial LAMA/LABA - Prescribed Stiolto inhaler, two puffs daily in the morning. - Provided Stiolto sample and demonstrated inhaler technique. - Refilled albuterol  inhaler for rescue use.  No  orders of the defined types were placed in this encounter.  I spent 30 minutes reviewing patient's chart including prior consultant notes, imaging, and PFTs as well as face-to-face with the patient, over half in discussion of the diagnosis and the importance of compliance with the treatment plan.  Return in about 3 months (around 08/15/2024).   Shlonda Dolloff, MD "

## 2024-05-15 ENCOUNTER — Encounter: Payer: Self-pay | Admitting: Pulmonary Disease

## 2024-05-15 ENCOUNTER — Ambulatory Visit: Admitting: Pulmonary Disease

## 2024-05-15 VITALS — BP 110/56 | HR 65 | Ht 62.0 in | Wt 192.8 lb

## 2024-05-15 DIAGNOSIS — J42 Unspecified chronic bronchitis: Secondary | ICD-10-CM | POA: Diagnosis not present

## 2024-05-15 MED ORDER — ALBUTEROL SULFATE HFA 108 (90 BASE) MCG/ACT IN AERS: 2.0000 | INHALATION_SPRAY | Freq: Four times a day (QID) | RESPIRATORY_TRACT | 6 refills | Status: AC | PRN

## 2024-05-15 MED ORDER — STIOLTO RESPIMAT 2.5-2.5 MCG/ACT IN AERS: 2.0000 | INHALATION_SPRAY | Freq: Every day | RESPIRATORY_TRACT | Status: AC

## 2024-05-15 MED ORDER — STIOLTO RESPIMAT 2.5-2.5 MCG/ACT IN AERS
2.0000 | INHALATION_SPRAY | Freq: Every day | RESPIRATORY_TRACT | 6 refills | Status: AC
Start: 2024-05-15 — End: ?

## 2024-05-15 NOTE — Patient Instructions (Signed)
  VISIT SUMMARY: Today, we discussed your COPD symptoms, including shortness of breath and chronic cough. We reviewed your recent hospitalization and current medications.  YOUR PLAN: CHRONIC OBSTRUCTIVE PULMONARY DISEASE (COPD): You have mild to moderate COPD, which causes symptoms like shortness of breath, chronic dry cough, and wheezing. -Start using the Stiolto inhaler, two puffs daily in the morning. -You were given a sample of the Stiolto inhaler and shown how to use it properly. -Continue using your albuterol  inhaler as needed for wheezing.  Contains text generated by [employer].

## 2024-05-28 ENCOUNTER — Telehealth: Payer: Self-pay | Admitting: Diagnostic Neuroimaging

## 2024-05-28 NOTE — Telephone Encounter (Signed)
 R/s due to weather

## 2024-05-29 ENCOUNTER — Institutional Professional Consult (permissible substitution): Admitting: Diagnostic Neuroimaging

## 2024-07-05 ENCOUNTER — Ambulatory Visit: Attending: Cardiology | Admitting: Cardiology

## 2024-07-05 ENCOUNTER — Encounter: Payer: Self-pay | Admitting: Cardiology

## 2024-07-05 VITALS — BP 116/70 | HR 60 | Ht 62.0 in | Wt 194.6 lb

## 2024-07-05 DIAGNOSIS — I10 Essential (primary) hypertension: Secondary | ICD-10-CM | POA: Diagnosis not present

## 2024-07-05 DIAGNOSIS — I5032 Chronic diastolic (congestive) heart failure: Secondary | ICD-10-CM

## 2024-07-05 DIAGNOSIS — E782 Mixed hyperlipidemia: Secondary | ICD-10-CM | POA: Diagnosis not present

## 2024-07-05 DIAGNOSIS — I2510 Atherosclerotic heart disease of native coronary artery without angina pectoris: Secondary | ICD-10-CM

## 2024-07-05 MED ORDER — EMPAGLIFLOZIN 10 MG PO TABS: 10.0000 mg | ORAL_TABLET | Freq: Every day | ORAL | 3 refills | Status: AC

## 2024-07-05 NOTE — Progress Notes (Signed)
 "     Clinical Summary Gloria Mccoy is a 79 y.o.female seen today for follow up of the following medical problems.    1. CAD - admit Jan 2019 with unstable angina. - Jan 2019 with distal LM 80%, prox LAD 90%. She was referred for CABG/ s/p LIMA-LAD, SVG-D1, SVG-RI - Jan 2019 echo LVEF 55-60%   - 10/2017 admitted with chest pain. Admitted with NSTEMI - 10/2017 cath with ostial LAD 45%, mid LAD 80%, D1 40%, ramus 20%, LCX 25%, RCA patent. LIMA-LAD patent, SVG-ramus occluded, SVG-D1 99%. Recs for medical therapy, discharged on DAPT with plans for 1 year of treatment.        - no chest pains, SOB improved with recent inhalers by pulmonary - compliant with meds     2. Hyperlipidemia  - compliant with statin - recent labs followed by pcp   01/2022 TC 81 TG 82 HDL 30 LDL 34 - 01/2023 TC 90 TG 80 HDL 36 LDL 37 - 01/2024 TC 75 TG 50 HDL 30 LDL 32   3. HTN - compliant with meds  4. Chronic HFpEF - 09/2023 echo: LVEF 70-75%< no WMAs, indet diastolic, RV not well visualized,  - 02/2024 limited echo: LVEF 65-70%, no WMAs, indet diasotlic, normal RV  - no SOB/DOE. Occasoinal LE edema.      5. Breast cancer - s/p lumpectomy -she is on chemoradiation, lumpectomy - being monitored, undergoing observation.  6.DOE/Chronic bronchitis - 01/2024 PFTs: mild obstruction, probable restriction, severe diffusion defect - followed by pulmonary - started on inhalers  Past Medical History:  Diagnosis Date   Arthritis    hands sometimes (07/13/2017)   Coronary artery disease    a. s/p CABG x3 in 06/2017 with LIMA-LAD, SVG-D1, and SVG-RI.  b. 10/2017: cath showing a widely patent LIMA-LAD with ostial occlusion of the SVG-RI and subtotally occluded atretic SVG-D1. Graft occlusion thought to be 2ry to improvement in pre-CABG stenoses.    Essential hypertension    Family history of adverse reaction to anesthesia    sister had a complication that was stated she had a foggy episode after her breast  surgery was readmitted 1 day post op after being discharged from the hospital. Sister also has significant lung problems that could have contributed to this    Family history of breast cancer    Family history of colon cancer    Hypothyroid    Meniere disease    Myocardial infarction (HCC) 06/2017   during [facility]   Obesity    Osteopenia 01/2012   T score -1.3 FRAX 7.9%/0.6%   Personal history of chemotherapy 2021   Personal history of radiation therapy 2021   left breast     Allergies[1]   Current Outpatient Medications  Medication Sig Dispense Refill   albuterol  (VENTOLIN  HFA) 108 (90 Base) MCG/ACT inhaler Inhale 2 puffs into the lungs every 6 (six) hours as needed for wheezing or shortness of breath. 51 each 6   AREXVY 120 MCG/0.5ML injection Inject 0.5 mLs into the muscle once.     aspirin  EC 81 MG tablet Take 81 mg by mouth daily.     atorvastatin  (LIPITOR ) 80 MG tablet TAKE 1 TABLET ONE TIME DAILY AT 6PM 90 tablet 3   Bioflavonoid Products (VITAMIN C) CHEW Chew 1 each by mouth daily.     Cholecalciferol (VITAMIN D  PO) Take 1 tablet by mouth daily.      Cyanocobalamin  (VITAMIN B-12 PO) Take 1 tablet by mouth daily.  empagliflozin  (JARDIANCE ) 10 MG TABS tablet Take 1 tablet (10 mg total) by mouth daily. 90 tablet 1   fish oil-omega-3 fatty acids  1000 MG capsule Take 1 g by mouth daily.     fluticasone  (FLONASE ) 50 MCG/ACT nasal spray Place 2 sprays into both nostrils daily. 16 g 0   furosemide  (LASIX ) 20 MG tablet Take 2 tablets (40 mg total) by mouth daily. 60 tablet 0   levothyroxine  (SYNTHROID ) 25 MCG tablet Take 25 mcg by mouth daily. Take 1 tablet by mouth every day with 1 tablet of Levothyroxine  75mcg for a combined dose of 100mcg.     levothyroxine  (SYNTHROID , LEVOTHROID) 75 MCG tablet Take 75 mcg by mouth daily. Take 1 tablet by mouth every day with 1 tablet of Levothyroxine  25mcg for a combined dose of 100mcg.     losartan  (COZAAR ) 25 MG tablet TAKE 1 TABLET  (25 MG TOTAL) BY MOUTH DAILY. (DOSE INCREASE) 90 tablet 3   methocarbamol  (ROBAXIN ) 500 MG tablet Take 500 mg by mouth in the morning and at bedtime.     metoprolol  tartrate (LOPRESSOR ) 25 MG tablet TAKE 1 TABLET TWICE DAILY 180 tablet 3   nitroGLYCERIN  (NITROSTAT ) 0.4 MG SL tablet Place 1 tablet (0.4 mg total) under the tongue every 5 (five) minutes x 3 doses as needed for chest pain (if no relief after 3rd dose, proceed to ED or call 911). 25 tablet 3   olopatadine  (PATADAY ) 0.1 % ophthalmic solution Place 1 drop into both eyes 2 (two) times daily. 5 mL 0   potassium chloride  SA (KLOR-CON  M) 20 MEQ tablet Take 1 tablet (20 mEq total) by mouth daily. 30 tablet 4   SPIKEVAX syringe Inject 0.5 mLs into the muscle once.     Tiotropium Bromide-Olodaterol (STIOLTO RESPIMAT ) 2.5-2.5 MCG/ACT AERS Inhale 2 puffs into the lungs daily. 12 each 6   Tiotropium Bromide-Olodaterol (STIOLTO RESPIMAT ) 2.5-2.5 MCG/ACT AERS Inhale 2 puffs into the lungs daily.     vitamin E  100 UNIT capsule Take 1 capsule (100 Units total) by mouth daily. 30 capsule 0   No current facility-administered medications for this visit.     Past Surgical History:  Procedure Laterality Date   BREAST LUMPECTOMY WITH RADIOACTIVE SEED AND SENTINEL LYMPH NODE BIOPSY Left 09/01/2018   Procedure: LEFT BREAST LUMPECTOMY WITH RADIOACTIVE SEED AND SENTINEL LYMPH NODE BIOPSY;  Surgeon: Curvin Deward MOULD, MD;  Location: [facility] OR;  Service: General;  Laterality: Left;   BREAST SURGERY Left 2020   INVASIVE DUCTAL CARCINOMA/DUCTAL CARCINOMA IN SITU/MARGINS UNINVOLVED   CARDIAC CATHETERIZATION  07/13/2017   CATARACT EXTRACTION W/PHACO Right 01/28/2015   Procedure: CATARACT EXTRACTION PHACO AND INTRAOCULAR LENS PLACEMENT :  CDE:  5.70;  Surgeon: Oneil Platts, MD;  Location: [facility]S;  Service: Ophthalmology;  Laterality: Right;   CATARACT EXTRACTION W/PHACO Left 02/11/2015   Procedure: CATARACT EXTRACTION PHACO AND INTRAOCULAR LENS PLACEMENT (IOC);   Surgeon: Oneil Platts, MD;  Location: [facility];  Service: Ophthalmology;  Laterality: Left;  CDE: 7.38   COLONOSCOPY N/A 09/26/2012   Procedure: COLONOSCOPY;  Surgeon: Oneil DELENA Budge, MD;  Location: [facility];  Service: Gastroenterology;  Laterality: N/A;   CORONARY ARTERY BYPASS GRAFT N/A 07/15/2017   Procedure: CORONARY ARTERY BYPASS GRAFTING (CABG) X 3 USING LEFT INTERNAL MAMMARY ARTERY AND RIGHT SAPHENOUS VEIN- ENDOSCOPICALLY HARVESTED;  Surgeon: Kerrin Elspeth BROCKS, MD;  Location: [facility] OR;  Service: Open Heart Surgery;  Laterality: N/A;   IR IMAGING GUIDED PORT INSERTION  12/29/2018   IR REMOVAL  TUN ACCESS W/ PORT W/O FL MOD SED  04/26/2019   LEFT HEART CATH AND CORONARY ANGIOGRAPHY N/A 07/13/2017   Procedure: LEFT HEART CATH AND CORONARY ANGIOGRAPHY;  Surgeon: Jordan, Peter M, MD;  Location: [facility] INVASIVE CV LAB;  Service: Cardiovascular;  Laterality: N/A;   LEFT HEART CATH AND CORS/GRAFTS ANGIOGRAPHY N/A 11/03/2017   Procedure: LEFT HEART CATH AND CORS/GRAFTS ANGIOGRAPHY;  Surgeon: Burnard Debby LABOR, MD;  Location: [facility] INVASIVE CV LAB;  Service: Cardiovascular;  Laterality: N/A;   TEE WITHOUT CARDIOVERSION N/A 07/15/2017   Procedure: TRANSESOPHAGEAL ECHOCARDIOGRAM (TEE);  Surgeon: Kerrin Elspeth BROCKS, MD;  Location: [facility] OR;  Service: Open Heart Surgery;  Laterality: N/A;   TUBAL LIGATION     TYMPANOPLASTY Left    fluid from ear drum     Allergies[2]    Family History  Problem Relation Age of Onset   Diabetes Sister        AODM   Heart disease Sister 39       CABG   Breast cancer Sister    Heart disease Brother 51       In his 54s   Colon cancer Maternal Uncle 71   Diabetes Sister        AODM   Hypertension Daughter    Hypertension Daughter    Heart attack Father        In his 4s   Stroke Mother    Stroke Maternal Grandmother    Diabetes Maternal Grandfather        d. 109   Heart attack Paternal Grandmother    Colon cancer Maternal Uncle 28   Breast cancer Cousin         dx 67s; d. 52s   Breast cancer Niece 31     Social History Gloria Mccoy reports that she has never smoked. She quit smokeless tobacco use about 6 years ago.  Her smokeless tobacco use included snuff. Gloria Mccoy reports that she does not currently use alcohol after a past usage of about 1.0 standard drink of alcohol per week.  Gloria Mccoy   Physical Examination Today's Vitals   07/05/24 0808  BP: 116/70  Pulse: 60  SpO2: 95%  Weight: 194 lb 9.6 oz (88.3 kg)  Height: 5' 2 (1.575 m)   Body mass index is 35.59 kg/m.  Gen: resting comfortably, no acute distress HEENT: no scleral icterus, pupils equal round and reactive, no palptable cervical adenopathy,  CV: RRR, no m/r,g no jvd Resp: Clear to auscultation bilaterally GI: abdomen is soft, non-tender, non-distended, normal bowel sounds, no hepatosplenomegaly MSK: extremities are warm, no edema.  Skin: warm, no rash Neuro:  no focal deficits Psych: appropriate affect   Diagnostic Studies  Jan 2019 cath Mid LM lesion is 25% stenosed. Dist LM to Ost LAD lesion is 80% stenosed. Ost Cx to Prox Cx lesion is 30% stenosed. Ost 1st Mrg lesion is 50% stenosed. Prox LAD lesion is 90% stenosed. The left ventricular systolic function is normal. LV end diastolic pressure is normal. The left ventricular ejection fraction is 55-65% by visual estimate.   1. Single vessel obstructive CAD. Patient has complex ostial and proximal to mid LAD disease that involves the origin of the ramus intermediate and first diagonal respectively. 2. Normal LV function 3. Normal LVEDP   Plan: given complexity of lesions with ostial and bifurcation LAD disease I would recommend consideration for CABG.   Jan 2019 Carotid US  Final Interpretation: Right Carotid: Velocities in the right ICA are consistent with a  1-39% stenosis.  Left Carotid: Velocities in the left ICA are consistent with a 1-39% stenosis. Vertebrals:  Both vertebral arteries were patent with  antegrade flow. Subclavians:   Jan 2019 echo Study Conclusions   - Left ventricle: The cavity size was normal. Wall thickness was   increased in a pattern of mild LVH. Systolic function was normal.   The estimated ejection fraction was in the range of 55% to 60%.   Wall motion was normal; there were no regional wall motion   abnormalities. Doppler parameters are consistent with abnormal   left ventricular relaxation (grade 1 diastolic dysfunction). - Mitral valve: Valve area by pressure half-time: 1.26 cm^2.   Impressions:   - Normal LV systolic function; mild LVH; mild diastolic   dysfunction.   10/2017 cath Ost 1st Diag to 1st Diag lesion is 40% stenosed. Ost Cx lesion is 25% stenosed. Ost Ramus lesion is 20% stenosed. Ost LAD lesion is 45% stenosed. Prox LAD to Mid LAD lesion is 80% stenosed. Origin lesion is 100% stenosed. Origin lesion is 99% stenosed. Origin to Dist Graft lesion is 99% stenosed. Dist Graft lesion is 100% stenosed. LV end diastolic pressure is mildly elevated. The left ventricular systolic function is normal.   Hyperdynamic LV function with an EF of 65% without focal segmental wall motion abnormalities.   Native coronary obstructive disease with 40% ostial LAD stenosis, diffuse 40 to 50% proximal diagonal stenosis with 80% LAD stenosis diffusely after the diagonal takeoff and evidence for competitive filling to the mid LAD via the LIMA graft; 25% ostial smooth narrowing in the ramus intermediate vessel; 25% ostial smooth narrowing in the left circumflex vessel; normal RCA.   Widely patent LIMA graft which supplies the mid LAD.   Totally ostial occlusion of the SVG which had supplied the ramus intermediate vessel.   Subtotally occluded ostial vein graft with diffuse 99% narrowing insistent with an atretic graft  which does not fill the diagonal vessel.   POST CATH RECOMMENDATION: Medical therapy.  I suspect the early vein graft occlusion was contributed  by improvement in the prior pre-CABG stenoses.       Assessment and Plan  1. CAD with chronic stable angina - denies recnet symptoms, continue current meds   2. Hyperlipidemia -at goal, continue current meds   3. HTN - at goal, continue current meds  4. Chronic HFpEF - euvolemic without symptoms, continue current therapy  F/u 6 monts      Dorn PHEBE Ross, M.D.     [1]  Allergies Allergen Reactions   Aloe Vera Itching    Per patient severe itching   Sulfa Antibiotics Shortness Of Breath   Sulfasalazine Shortness Of Breath  [2]  Allergies Allergen Reactions   Aloe Vera Itching    Per patient severe itching   Sulfa Antibiotics Shortness Of Breath   Sulfasalazine Shortness Of Breath   "

## 2024-07-05 NOTE — Patient Instructions (Addendum)
 Medication Instructions:   Jardiance  refilled today  Continue all other medications.     Labwork:  none  Testing/Procedures:  none  Follow-Up:  6 months   Any Other Special Instructions Will Be Listed Below (If Applicable).   If you need a refill on your cardiac medications before your next appointment, please call your pharmacy.

## 2024-07-18 ENCOUNTER — Other Ambulatory Visit: Payer: Self-pay | Admitting: Cardiology

## 2024-07-19 ENCOUNTER — Telehealth: Payer: Self-pay | Admitting: Diagnostic Neuroimaging

## 2024-07-19 ENCOUNTER — Encounter: Payer: Self-pay | Admitting: Diagnostic Neuroimaging

## 2024-07-19 ENCOUNTER — Ambulatory Visit: Admitting: Diagnostic Neuroimaging

## 2024-07-19 VITALS — BP 133/65 | HR 59 | Ht 62.0 in | Wt 198.6 lb

## 2024-07-19 DIAGNOSIS — R29898 Other symptoms and signs involving the musculoskeletal system: Secondary | ICD-10-CM

## 2024-07-19 DIAGNOSIS — R269 Unspecified abnormalities of gait and mobility: Secondary | ICD-10-CM

## 2024-07-19 DIAGNOSIS — R292 Abnormal reflex: Secondary | ICD-10-CM

## 2024-07-19 NOTE — Progress Notes (Signed)
 "  [facility]  PATIENT: Gloria Mccoy DOB: 19-Dec-1945  REFERRING CLINICIAN: Bertell Satterfield, MD HISTORY FROM: patient  REASON FOR VISIT: new consult   HISTORICAL  CHIEF COMPLAINT:  Chief Complaint  Patient presents with   RM 6     Patient is here with daughter for cervical myelopathy, gait disturbance - has been having been having issues with balance for a year no falls     HISTORY OF PRESENT ILLNESS:   79 year old female here for evaluation of gait and balance difficulty.  Patient referred for evaluation of cervical myelopathy.  For past 1 year patient had gradual and progressive gait and balance difficulty.  Symptoms she feels numbness in the left foot and feels like her left leg gets stuck to the ground.  She has some neck pain issues rating to the bilateral shoulders.  No falls recently.  Sometimes she uses a shopping cart or walker for help with balance.    REVIEW OF SYSTEMS: Full 14 system review of systems performed and negative with exception of: as per HPI.  ALLERGIES: Allergies[1]  HOME MEDICATIONS: Outpatient Medications Prior to Visit  Medication Sig Dispense Refill   albuterol  (VENTOLIN  HFA) 108 (90 Base) MCG/ACT inhaler Inhale 2 puffs into the lungs every 6 (six) hours as needed for wheezing or shortness of breath. 51 each 6   AREXVY 120 MCG/0.5ML injection Inject 0.5 mLs into the muscle once.     aspirin  EC 81 MG tablet Take 81 mg by mouth daily.     atorvastatin  (LIPITOR ) 80 MG tablet TAKE 1 TABLET ONE TIME DAILY AT 6PM 90 tablet 3   Bioflavonoid Products (VITAMIN C) CHEW Chew 1 each by mouth daily.     Cholecalciferol (VITAMIN D  PO) Take 1 tablet by mouth daily.      Cyanocobalamin  (VITAMIN B-12 PO) Take 1 tablet by mouth daily.     empagliflozin  (JARDIANCE ) 10 MG TABS tablet Take 1 tablet (10 mg total) by mouth daily. 90 tablet 3   fish oil-omega-3 fatty acids  1000 MG capsule Take 1 g by mouth daily.     fluticasone  (FLONASE ) 50  MCG/ACT nasal spray Place 2 sprays into both nostrils daily. 16 g 0   furosemide  (LASIX ) 40 MG tablet Take 40 mg by mouth daily.     levothyroxine  (SYNTHROID ) 25 MCG tablet Take 25 mcg by mouth daily. Take 1 tablet by mouth every day with 1 tablet of Levothyroxine  75mcg for a combined dose of 100mcg.     levothyroxine  (SYNTHROID , LEVOTHROID) 75 MCG tablet Take 75 mcg by mouth daily. Take 1 tablet by mouth every day with 1 tablet of Levothyroxine  25mcg for a combined dose of 100mcg.     losartan  (COZAAR ) 25 MG tablet TAKE 1 TABLET (25 MG TOTAL) BY MOUTH DAILY. (DOSE INCREASE) 90 tablet 3   metoprolol  tartrate (LOPRESSOR ) 25 MG tablet TAKE 1 TABLET TWICE DAILY 180 tablet 3   nitroGLYCERIN  (NITROSTAT ) 0.4 MG SL tablet Place 1 tablet (0.4 mg total) under the tongue every 5 (five) minutes x 3 doses as needed for chest pain (if no relief after 3rd dose, proceed to ED or call 911). 25 tablet 3   potassium chloride  SA (KLOR-CON  M) 20 MEQ tablet Take 1 tablet (20 mEq total) by mouth daily. 30 tablet 4   SPIKEVAX syringe Inject 0.5 mLs into the muscle once.     Tiotropium Bromide-Olodaterol (STIOLTO RESPIMAT ) 2.5-2.5 MCG/ACT AERS Inhale 2 puffs into the lungs daily. 12 each 6  Tiotropium Bromide-Olodaterol (STIOLTO RESPIMAT ) 2.5-2.5 MCG/ACT AERS Inhale 2 puffs into the lungs daily.     vitamin E  100 UNIT capsule Take 1 capsule (100 Units total) by mouth daily. 30 capsule 0   No facility-administered medications prior to visit.    PAST MEDICAL HISTORY: Past Medical History:  Diagnosis Date   Arthritis    hands sometimes (07/13/2017)   Coronary artery disease    a. s/p CABG x3 in 06/2017 with LIMA-LAD, SVG-D1, and SVG-RI.  b. 10/2017: cath showing a widely patent LIMA-LAD with ostial occlusion of the SVG-RI and subtotally occluded atretic SVG-D1. Graft occlusion thought to be 2ry to improvement in pre-CABG stenoses.    Essential hypertension    Family history of adverse reaction to anesthesia     sister had a complication that was stated she had a foggy episode after her breast surgery was readmitted 1 day post op after being discharged from the hospital. Sister also has significant lung problems that could have contributed to this    Family history of breast cancer    Family history of colon cancer    Hypothyroid    Meniere disease    Myocardial infarction (HCC) 06/2017   during [facility]   Obesity    Osteopenia 01/2012   T score -1.3 FRAX 7.9%/0.6%   Personal history of chemotherapy 2021   Personal history of radiation therapy 2021   left breast    PAST SURGICAL HISTORY: Past Surgical History:  Procedure Laterality Date   BREAST LUMPECTOMY WITH RADIOACTIVE SEED AND SENTINEL LYMPH NODE BIOPSY Left 09/01/2018   Procedure: LEFT BREAST LUMPECTOMY WITH RADIOACTIVE SEED AND SENTINEL LYMPH NODE BIOPSY;  Surgeon: Curvin Deward MOULD, MD;  Location: [facility] OR;  Service: General;  Laterality: Left;   BREAST SURGERY Left 2020   INVASIVE DUCTAL CARCINOMA/DUCTAL CARCINOMA IN SITU/MARGINS UNINVOLVED   CARDIAC CATHETERIZATION  07/13/2017   CATARACT EXTRACTION W/PHACO Right 01/28/2015   Procedure: CATARACT EXTRACTION PHACO AND INTRAOCULAR LENS PLACEMENT :  CDE:  5.70;  Surgeon: Oneil Platts, MD;  Location: [facility] ORS;  Service: Ophthalmology;  Laterality: Right;   CATARACT EXTRACTION W/PHACO Left 02/11/2015   Procedure: CATARACT EXTRACTION PHACO AND INTRAOCULAR LENS PLACEMENT (IOC);  Surgeon: Oneil Platts, MD;  Location: [facility] ORS;  Service: Ophthalmology;  Laterality: Left;  CDE: 7.38   COLONOSCOPY N/A 09/26/2012   Procedure: COLONOSCOPY;  Surgeon: Oneil DELENA Budge, MD;  Location: [facility] SUITE;  Service: Gastroenterology;  Laterality: N/A;   CORONARY ARTERY BYPASS GRAFT N/A 07/15/2017   Procedure: CORONARY ARTERY BYPASS GRAFTING (CABG) X 3 USING LEFT INTERNAL MAMMARY ARTERY AND RIGHT SAPHENOUS VEIN- ENDOSCOPICALLY HARVESTED;  Surgeon: Kerrin Elspeth BROCKS, MD;  Location: [facility] OR;  Service: Open Heart  Surgery;  Laterality: N/A;   IR IMAGING GUIDED PORT INSERTION  12/29/2018   IR REMOVAL TUN ACCESS W/ PORT W/O FL MOD SED  04/26/2019   LEFT HEART CATH AND CORONARY ANGIOGRAPHY N/A 07/13/2017   Procedure: LEFT HEART CATH AND CORONARY ANGIOGRAPHY;  Surgeon: Jordan, Peter M, MD;  Location: [facility] INVASIVE CV LAB;  Service: Cardiovascular;  Laterality: N/A;   LEFT HEART CATH AND CORS/GRAFTS ANGIOGRAPHY N/A 11/03/2017   Procedure: LEFT HEART CATH AND CORS/GRAFTS ANGIOGRAPHY;  Surgeon: Burnard Debby DELENA, MD;  Location: [facility] INVASIVE CV LAB;  Service: Cardiovascular;  Laterality: N/A;   TEE WITHOUT CARDIOVERSION N/A 07/15/2017   Procedure: TRANSESOPHAGEAL ECHOCARDIOGRAM (TEE);  Surgeon: Kerrin Elspeth BROCKS, MD;  Location[facility] OR;  Service: Open Heart Surgery;  Laterality: N/A;   TUBAL  LIGATION     TYMPANOPLASTY Left    fluid from ear drum    FAMILY HISTORY: Family History  Problem Relation Age of Onset   Stroke Mother    Heart attack Father        In his 86s   Diabetes Sister        AODM   Heart disease Sister 25       CABG   Breast cancer Sister    Diabetes Sister        AODM   Migraines Sister    Migraines Sister    Heart disease Brother 31       In his 85s   Stroke Maternal Grandmother    Diabetes Maternal Grandfather        d. 109   Heart attack Paternal Grandmother    Hypertension Daughter    Hypertension Daughter    Colon cancer Maternal Uncle 34   Colon cancer Maternal Uncle 20   Breast cancer Cousin        dx 68s; d. 73s   Breast cancer Niece 31   Seizures Other     SOCIAL HISTORY: Social History   Socioeconomic History   Marital status: Married    Spouse name: Not on file   Number of children: Not on file   Years of education: Not on file   Highest education level: Not on file  Occupational History   Occupation: Retired  Tobacco Use   Smoking status: Never   Smokeless tobacco: Former    Types: Snuff    Quit date: 07/31/2017  Vaping Use   Vaping status: Never  Used  Substance and Sexual Activity   Alcohol use: Not Currently    Alcohol/week: 1.0 standard drink of alcohol    Types: 1 Standard drinks or equivalent per week    Comment: occasionally   Drug use: No   Sexual activity: Yes    Birth control/protection: Surgical  Other Topics Concern   Not on file  Social History Narrative   Left handed    Lives at home with husband    Caffeine- 1-2 cups of coffees and has soda 3-4 times per day    Social Drivers of Health   Tobacco Use: Medium Risk (07/19/2024)   Patient History    Smoking Tobacco Use: Never    Smokeless Tobacco Use: Former    Passive Exposure: Not on Actuary Strain: Not on file  Food Insecurity: No Food Insecurity (09/21/2023)   Hunger Vital Sign    Worried About Running Out of Food in the Last Year: Never true    Ran Out of Food in the Last Year: Never true  Transportation Needs: No Transportation Needs (09/21/2023)   PRAPARE - Administrator, Civil Service (Medical): No    Lack of Transportation (Non-Medical): No  Physical Activity: Not on file  Stress: Not on file  Social Connections: Unknown (09/21/2023)   Social Connection and Isolation Panel    Frequency of Communication with Friends and Family: Once a week    Frequency of Social Gatherings with Friends and Family: Once a week    Attends Religious Services: Not on Marketing Executive or Organizations: Not on file    Attends Banker Meetings: 1 to 4 times per year    Marital Status: Patient declined  Intimate Partner Violence: Not At Risk (09/21/2023)   Humiliation, Afraid, Rape, and Kick questionnaire  Fear of Current or Ex-Partner: No    Emotionally Abused: No    Physically Abused: No    Sexually Abused: No  Depression (PHQ2-9): Not on file  Alcohol Screen: Not on file  Housing: Low Risk (09/21/2023)   Housing Stability Vital Sign    Unable to Pay for Housing in the Last Year: No    Number of Times Moved in the  Last Year: 0    Homeless in the Last Year: No  Utilities: Not At Risk (09/21/2023)   AHC Utilities    Threatened with loss of utilities: No  Health Literacy: Not on file     PHYSICAL EXAM  GENERAL EXAM/CONSTITUTIONAL: Vitals:  Vitals:   07/19/24 1427  BP: 133/65  Pulse: (!) 59  Weight: 198 lb 9.6 oz (90.1 kg)  Height: 5' 2 (1.575 m)   Body mass index is 36.32 kg/m. Wt Readings from Last 3 Encounters:  07/19/24 198 lb 9.6 oz (90.1 kg)  07/05/24 194 lb 9.6 oz (88.3 kg)  05/15/24 192 lb 12.8 oz (87.5 kg)   Patient is in no distress; well developed, nourished and groomed; neck is supple  CARDIOVASCULAR: Examination of carotid arteries is normal; no carotid bruits Regular rate and rhythm, no murmurs Examination of peripheral vascular system by observation and palpation is normal  EYES: Ophthalmoscopic exam of optic discs and posterior segments is normal; no papilledema or hemorrhages No results found.  MUSCULOSKELETAL: Gait, strength, tone, movements noted in Neurologic exam below  NEUROLOGIC: MENTAL STATUS:      No data to display         awake, alert, oriented to person, place and time recent and remote memory intact normal attention and concentration language fluent, comprehension intact, naming intact fund of knowledge appropriate  CRANIAL NERVE:  2nd - no papilledema on fundoscopic exam 2nd, 3rd, 4th, 6th - pupils equal and reactive to light, visual fields full to confrontation, extraocular muscles intact, no nystagmus 5th - facial sensation symmetric 7th - facial strength symmetric 8th - hearing intact 9th - palate elevates symmetrically, uvula midline 11th - shoulder shrug symmetric 12th - tongue protrusion midline  MOTOR:  normal bulk and tone, full strength in the BUE, BLE; EXCEPT LEFT HF 4+  SENSORY:  normal and symmetric to light touch, temperature, vibration  COORDINATION:  finger-nose-finger, fine finger movements normal  REFLEXES:   deep tendon reflexes present and symmetric; BRISK AT KNEES  GAIT/STATION:  narrow based gait; DECR ARM SWING     DIAGNOSTIC DATA (LABS, IMAGING, TESTING) - I reviewed patient records, labs, notes, testing and imaging myself where available.  Lab Results  Component Value Date   WBC 22.3 (H) 09/22/2023   HGB 11.6 (L) 09/22/2023   HCT 32.0 (L) 09/22/2023   MCV 83.1 09/22/2023   PLT 259 09/22/2023      Component Value Date/Time   NA 139 04/04/2024 0812   K 4.2 04/04/2024 0812   CL 99 04/04/2024 0812   CO2 28 04/04/2024 0812   GLUCOSE 139 (H) 04/04/2024 0812   GLUCOSE 110 (H) 11/15/2023 0817   BUN 11 04/04/2024 0812   CREATININE 0.91 04/04/2024 0812   CREATININE 0.80 09/24/2021 0925   CALCIUM  9.4 04/04/2024 0812   PROT 7.3 09/20/2023 2154   ALBUMIN  3.5 09/20/2023 2154   AST 24 09/20/2023 2154   AST 16 09/24/2021 0925   ALT 16 09/20/2023 2154   ALT 13 09/24/2021 0925   ALKPHOS 65 09/20/2023 2154   BILITOT 0.9 09/20/2023 2154  BILITOT 0.8 09/24/2021 0925   GFRNONAA >60 11/15/2023 0817   GFRNONAA >60 09/24/2021 0925   GFRAA >60 04/26/2019 1338   GFRAA >60 01/16/2019 0840   Lab Results  Component Value Date   CHOL 79 11/18/2017   HDL 25 (L) 11/18/2017   LDLCALC 44 11/18/2017   TRIG 52 11/18/2017   CHOLHDL 3.2 11/18/2017   Lab Results  Component Value Date   HGBA1C 6.0 (H) 09/21/2023   No results found for: VITAMINB12 Lab Results  Component Value Date   TSH 0.738 09/21/2023    07/20/23 MRI brain [I reviewed images myself and agree with interpretation. -VRP]  1. No acute intracranial abnormality. 2. Mild chronic microvascular ischemic changes of the white matter.   ASSESSMENT AND PLAN  79 y.o. year old female here with:  Dx:  1. Gait difficulty   2. Hyperreflexia   3. Left leg weakness     PLAN:  GAIT DIFF / NECK PAIN (radiating to arms), HYPERREFLEXIA, LEFT LEG WEAKNESS --> since ~early 2025 - check MRI cervical spine (rule out  myelopathy) - check B12 level - use cane / walker as needed - consider PT evaluation (patient wants to hold off for now)  Orders Placed This Encounter  Procedures   MR CERVICAL SPINE WO CONTRAST   Vitamin B12   Return for pending if symptoms worsen or fail to improve, pending test results.  I reviewed images, labs, notes, records myself. I summarized findings and reviewed with patient, for this high risk condition (myelopathy evaluation) requiring high complexity decision making.   EDUARD FABIENE HANLON, MD 07/19/2024, 3:01 PM Certified in Neurology, Neurophysiology and Neuroimaging  [facility] [address], [address] [place], [place] [address] [phone]     [1]  Allergies Allergen Reactions   Aloe Vera Itching    Per patient severe itching   Sulfa Antibiotics Shortness Of Breath   Sulfasalazine Shortness Of Breath   "

## 2024-07-19 NOTE — Telephone Encounter (Signed)
MRI order sent to [place] [phone]

## 2024-07-19 NOTE — Patient Instructions (Signed)
" °  GAIT DIFF / NECK PAIN (radiating to arms), HYPERREFLEXIA --> since ~2025 - check MRI cervical spine (rule out myelopathy) - check B12 level - use cane / walker as needed - consider PT evaluation (patient wants to hold off for now) "

## 2024-07-20 ENCOUNTER — Ambulatory Visit: Payer: Self-pay | Admitting: Diagnostic Neuroimaging

## 2024-07-20 LAB — VITAMIN B12: Vitamin B-12: 1260 pg/mL — ABNORMAL HIGH (ref 232–1245)

## 2024-08-28 ENCOUNTER — Ambulatory Visit: Payer: Self-pay | Admitting: Family Medicine

## 2024-08-29 ENCOUNTER — Ambulatory Visit: Payer: Self-pay

## 2024-08-29 ENCOUNTER — Ambulatory Visit: Admitting: Pulmonary Disease

## 2024-08-29 ENCOUNTER — Ambulatory Visit: Payer: Self-pay | Admitting: Family Medicine

## 2024-09-28 ENCOUNTER — Inpatient Hospital Stay

## 2024-09-28 ENCOUNTER — Inpatient Hospital Stay: Admitting: Adult Health
